# Patient Record
Sex: Female | Born: 1935 | Race: White | Hispanic: No | Marital: Single | Smoking: Never smoker
Health system: Southern US, Community
[De-identification: ages and names within clinical notes are randomized; demographics above are authoritative.]

## PROBLEM LIST (undated history)

## (undated) DIAGNOSIS — R42 Dizziness and giddiness: Secondary | ICD-10-CM

## (undated) DIAGNOSIS — I10 Essential (primary) hypertension: Secondary | ICD-10-CM

## (undated) DIAGNOSIS — M1990 Unspecified osteoarthritis, unspecified site: Secondary | ICD-10-CM

## (undated) DIAGNOSIS — I2699 Other pulmonary embolism without acute cor pulmonale: Secondary | ICD-10-CM

## (undated) DIAGNOSIS — K219 Gastro-esophageal reflux disease without esophagitis: Secondary | ICD-10-CM

## (undated) DIAGNOSIS — E785 Hyperlipidemia, unspecified: Secondary | ICD-10-CM

## (undated) DIAGNOSIS — E119 Type 2 diabetes mellitus without complications: Secondary | ICD-10-CM

## (undated) DIAGNOSIS — R0600 Dyspnea, unspecified: Secondary | ICD-10-CM

## (undated) DIAGNOSIS — N189 Chronic kidney disease, unspecified: Secondary | ICD-10-CM

## (undated) DIAGNOSIS — F419 Anxiety disorder, unspecified: Secondary | ICD-10-CM

## (undated) HISTORY — PX: SHOULDER SURGERY: SHX246

## (undated) HISTORY — PX: APPENDECTOMY: SHX54

## (undated) HISTORY — PX: BACK SURGERY: SHX140

---

## 2003-11-02 ENCOUNTER — Emergency Department: Payer: Self-pay | Admitting: Emergency Medicine

## 2004-05-17 ENCOUNTER — Ambulatory Visit: Payer: Self-pay | Admitting: General Practice

## 2004-06-01 ENCOUNTER — Emergency Department: Payer: Self-pay | Admitting: Emergency Medicine

## 2005-09-28 ENCOUNTER — Encounter: Admission: RE | Admit: 2005-09-28 | Discharge: 2005-09-28 | Payer: Self-pay | Admitting: Neurosurgery

## 2005-11-07 ENCOUNTER — Inpatient Hospital Stay (HOSPITAL_COMMUNITY): Admission: RE | Admit: 2005-11-07 | Discharge: 2005-11-08 | Payer: Self-pay | Admitting: Neurosurgery

## 2005-11-09 ENCOUNTER — Emergency Department: Payer: Self-pay | Admitting: General Practice

## 2005-11-13 ENCOUNTER — Inpatient Hospital Stay: Payer: Self-pay | Admitting: Internal Medicine

## 2005-11-27 ENCOUNTER — Ambulatory Visit: Payer: Self-pay | Admitting: Neurosurgery

## 2006-12-15 ENCOUNTER — Emergency Department: Payer: Self-pay | Admitting: Emergency Medicine

## 2009-02-23 ENCOUNTER — Emergency Department: Payer: Self-pay | Admitting: Emergency Medicine

## 2009-04-02 ENCOUNTER — Emergency Department: Payer: Self-pay | Admitting: Emergency Medicine

## 2009-04-22 ENCOUNTER — Encounter: Payer: Self-pay | Admitting: Anesthesiology

## 2009-05-09 ENCOUNTER — Encounter: Payer: Self-pay | Admitting: Anesthesiology

## 2009-09-22 ENCOUNTER — Ambulatory Visit: Payer: Self-pay | Admitting: Family Medicine

## 2010-06-13 ENCOUNTER — Emergency Department: Payer: Self-pay | Admitting: Emergency Medicine

## 2010-07-01 ENCOUNTER — Ambulatory Visit: Payer: Self-pay | Admitting: Family Medicine

## 2010-08-26 ENCOUNTER — Ambulatory Visit: Payer: Self-pay | Admitting: Specialist

## 2010-09-07 ENCOUNTER — Ambulatory Visit: Payer: Self-pay | Admitting: Specialist

## 2010-09-21 ENCOUNTER — Ambulatory Visit: Payer: Self-pay | Admitting: Specialist

## 2012-04-21 ENCOUNTER — Emergency Department: Payer: Self-pay | Admitting: Emergency Medicine

## 2012-04-21 LAB — COMPREHENSIVE METABOLIC PANEL
Alkaline Phosphatase: 100 U/L (ref 50–136)
Anion Gap: 6 — ABNORMAL LOW (ref 7–16)
BUN: 13 mg/dL (ref 7–18)
Bilirubin,Total: 0.4 mg/dL (ref 0.2–1.0)
Calcium, Total: 9 mg/dL (ref 8.5–10.1)
Chloride: 105 mmol/L (ref 98–107)
Co2: 27 mmol/L (ref 21–32)
Creatinine: 0.84 mg/dL (ref 0.60–1.30)
EGFR (African American): >60
EGFR (Non-African Amer.): >60
Glucose: 140 mg/dL — ABNORMAL HIGH (ref 65–99)
Osmolality: 278 (ref 275–301)
Potassium: 4.1 mmol/L (ref 3.5–5.1)
Total Protein: 6.8 g/dL (ref 6.4–8.2)

## 2012-04-21 LAB — TROPONIN I: Troponin-I: <0.02 ng/mL

## 2012-04-21 LAB — URINALYSIS, COMPLETE
Bilirubin,UR: NEGATIVE
Blood: NEGATIVE
Glucose,UR: NEGATIVE mg/dL (ref 0–75)
Ketone: NEGATIVE
Leukocyte Esterase: NEGATIVE
Ph: 6 (ref 4.5–8.0)
Squamous Epithelial: <1
WBC UR: <1 /HPF (ref 0–5)

## 2012-04-21 LAB — CK TOTAL AND CKMB (NOT AT ARMC)
CK, Total: 70 U/L (ref 21–215)
CK-MB: 1.2 ng/mL (ref 0.5–3.6)

## 2012-04-21 LAB — HEMOGLOBIN A1C: Hemoglobin A1C: 7.5 % — ABNORMAL HIGH (ref 4.2–6.3)

## 2012-04-21 LAB — CBC
RBC: 4.75 10*6/uL (ref 3.80–5.20)
WBC: 5.3 10*3/uL (ref 3.6–11.0)

## 2012-10-21 ENCOUNTER — Emergency Department: Payer: Self-pay | Admitting: Emergency Medicine

## 2012-10-21 LAB — COMPREHENSIVE METABOLIC PANEL
Albumin: 3.1 g/dL — ABNORMAL LOW (ref 3.4–5.0)
Anion Gap: 7 (ref 7–16)
BUN: 10 mg/dL (ref 7–18)
Bilirubin,Total: 0.3 mg/dL (ref 0.2–1.0)
Co2: 24 mmol/L (ref 21–32)
Potassium: 4.5 mmol/L (ref 3.5–5.1)
Total Protein: 6.4 g/dL (ref 6.4–8.2)

## 2012-10-21 LAB — URINALYSIS, COMPLETE
Glucose,UR: 50 mg/dL (ref 0–75)
Ph: 5 (ref 4.5–8.0)
Squamous Epithelial: <1

## 2012-10-21 LAB — TROPONIN I: Troponin-I: <0.02 ng/mL

## 2012-10-21 LAB — CBC
HCT: 38 % (ref 35.0–47.0)
HGB: 12.7 g/dL (ref 12.0–16.0)
MCHC: 33.4 g/dL (ref 32.0–36.0)
MCV: 86 fL (ref 80–100)
Platelet: 226 10*3/uL (ref 150–440)

## 2014-01-09 DIAGNOSIS — I2699 Other pulmonary embolism without acute cor pulmonale: Secondary | ICD-10-CM

## 2014-01-09 HISTORY — DX: Other pulmonary embolism without acute cor pulmonale: I26.99

## 2014-08-24 ENCOUNTER — Emergency Department: Payer: Medicare Other

## 2014-08-24 ENCOUNTER — Other Ambulatory Visit: Payer: Self-pay

## 2014-08-24 ENCOUNTER — Emergency Department
Admission: EM | Admit: 2014-08-24 | Discharge: 2014-08-24 | Disposition: A | Discharge status: Home / Self Care | Payer: Medicare Other | Attending: Emergency Medicine | Admitting: Emergency Medicine

## 2014-08-24 ENCOUNTER — Encounter: Payer: Self-pay | Admitting: *Deleted

## 2014-08-24 DIAGNOSIS — X58XXXA Exposure to other specified factors, initial encounter: Secondary | ICD-10-CM | POA: Insufficient documentation

## 2014-08-24 DIAGNOSIS — M546 Pain in thoracic spine: Secondary | ICD-10-CM | POA: Insufficient documentation

## 2014-08-24 DIAGNOSIS — Z79899 Other long term (current) drug therapy: Secondary | ICD-10-CM | POA: Insufficient documentation

## 2014-08-24 DIAGNOSIS — Y998 Other external cause status: Secondary | ICD-10-CM | POA: Insufficient documentation

## 2014-08-24 DIAGNOSIS — S29001A Unspecified injury of muscle and tendon of front wall of thorax, initial encounter: Secondary | ICD-10-CM | POA: Diagnosis present

## 2014-08-24 DIAGNOSIS — Y9389 Activity, other specified: Secondary | ICD-10-CM | POA: Insufficient documentation

## 2014-08-24 DIAGNOSIS — Z794 Long term (current) use of insulin: Secondary | ICD-10-CM | POA: Diagnosis not present

## 2014-08-24 DIAGNOSIS — Y9289 Other specified places as the place of occurrence of the external cause: Secondary | ICD-10-CM | POA: Diagnosis not present

## 2014-08-24 DIAGNOSIS — E119 Type 2 diabetes mellitus without complications: Secondary | ICD-10-CM | POA: Insufficient documentation

## 2014-08-24 DIAGNOSIS — M5489 Other dorsalgia: Secondary | ICD-10-CM

## 2014-08-24 DIAGNOSIS — S4992XA Unspecified injury of left shoulder and upper arm, initial encounter: Secondary | ICD-10-CM | POA: Diagnosis not present

## 2014-08-24 HISTORY — DX: Type 2 diabetes mellitus without complications: E11.9

## 2014-08-24 LAB — TROPONIN I

## 2014-08-24 LAB — CBC
HCT: 39.4 % (ref 35.0–47.0)
HEMOGLOBIN: 12.8 g/dL (ref 12.0–16.0)
MCH: 28.6 pg (ref 26.0–34.0)
MCHC: 32.6 g/dL (ref 32.0–36.0)
MCV: 87.8 fL (ref 80.0–100.0)
PLATELETS: 266 10*3/uL (ref 150–440)
RBC: 4.49 MIL/uL (ref 3.80–5.20)
RDW: 13.9 % (ref 11.5–14.5)
WBC: 7 10*3/uL (ref 3.6–11.0)

## 2014-08-24 LAB — BASIC METABOLIC PANEL
ANION GAP: 9 (ref 5–15)
BUN: 23 mg/dL — ABNORMAL HIGH (ref 6–20)
CALCIUM: 9.1 mg/dL (ref 8.9–10.3)
CO2: 28 mmol/L (ref 22–32)
CREATININE: 1.14 mg/dL — AB (ref 0.44–1.00)
Chloride: 100 mmol/L — ABNORMAL LOW (ref 101–111)
GFR, EST AFRICAN AMERICAN: 50 mL/min — AB (ref >60)
GFR, EST NON AFRICAN AMERICAN: 43 mL/min — AB (ref >60)
Glucose, Bld: 235 mg/dL — ABNORMAL HIGH (ref 65–99)
Potassium: 4.3 mmol/L (ref 3.5–5.1)
SODIUM: 137 mmol/L (ref 135–145)

## 2014-08-24 MED ORDER — LIDOCAINE 5 % EX PTCH
1.0000 | MEDICATED_PATCH | Freq: Two times a day (BID) | CUTANEOUS | Status: DC
Start: 2014-08-24 — End: 2014-11-26

## 2014-08-24 MED ORDER — ACETAMINOPHEN 325 MG PO TABS
650.0000 mg | ORAL_TABLET | Freq: Once | ORAL | Status: AC
  Administered 2014-08-24: 650 mg via ORAL
  Filled 2014-08-24: qty 2

## 2014-08-24 NOTE — Discharge Instructions (Signed)
Please seek medical attention for any high fevers, chest pain, shortness of breath, change in behavior, persistent vomiting, bloody stool or any other new or concerning symptoms.    Musculoskeletal Pain Musculoskeletal pain is muscle and boney aches and pains. These pains can occur in any part of the body. Your caregiver may treat you without knowing the cause of the pain. They may treat you if blood or urine tests, X-rays, and other tests were normal.  CAUSES There is often not a definite cause or reason for these pains. These pains may be caused by a type of germ (virus). The discomfort may also come from overuse. Overuse includes working out too hard when your body is not fit. Boney aches also come from weather changes. Bone is sensitive to atmospheric pressure changes. HOME CARE INSTRUCTIONS   Ask when your test results will be ready. Make sure you get your test results.  Only take over-the-counter or prescription medicines for pain, discomfort, or fever as directed by your caregiver. If you were given medications for your condition, do not drive, operate machinery or power tools, or sign legal documents for 24 hours. Do not drink alcohol. Do not take sleeping pills or other medications that may interfere with treatment.  Continue all activities unless the activities cause more pain. When the pain lessens, slowly resume normal activities. Gradually increase the intensity and duration of the activities or exercise.  During periods of severe pain, bed rest may be helpful. Lay or sit in any position that is comfortable.  Putting ice on the injured area.  Put ice in a bag.  Place a towel between your skin and the bag.  Leave the ice on for 15 to 20 minutes, 3 to 4 times a day.  Follow up with your caregiver for continued problems and no reason can be found for the pain. If the pain becomes worse or does not go away, it may be necessary to repeat tests or do additional testing. Your  caregiver may need to look further for a possible cause. SEEK IMMEDIATE MEDICAL CARE IF:  You have pain that is getting worse and is not relieved by medications.  You develop chest pain that is associated with shortness or breath, sweating, feeling sick to your stomach (nauseous), or throw up (vomit).  Your pain becomes localized to the abdomen.  You develop any new symptoms that seem different or that concern you. MAKE SURE YOU:   Understand these instructions.  Will watch your condition.  Will get help right away if you are not doing well or get worse. Document Released: 12/26/2004 Document Revised: 03/20/2011 Document Reviewed: 08/30/2012 [facility] Patient Information 2015 [place], [place]. This information is not intended to replace advice given to you by your health care provider. Make sure you discuss any questions you have with your health care provider.

## 2014-08-24 NOTE — ED Notes (Signed)
Pt reports increased back pain and left shoulder pain, pt reports a prior injury to her back

## 2014-08-24 NOTE — ED Provider Notes (Signed)
[facility] Emergency Department Provider Note    ____________________________________________  Time seen: 1950  I have reviewed the triage vital signs and the nursing notes.   HISTORY  Chief Complaint Back Pain   History limited by: Not Limited   HPI Sherry [name] is a 81 y.o. female who presents to the emergency department today because of concerns for left upper back pain. This pain is been going on for 2 days. She describes it as being present on the medial aspect of her left shoulder today. She does have pain whenever she moves her arm up. She does not have pain radiating down her arm however she denies any numbness or tingling in either of her upper or lower extremities. Patient denies any shortness of breath. Denies similar pain in the past. Denies any trauma.     Past Medical History  Diagnosis Date  . Diabetes mellitus without complication     There are no active problems to display for this patient.   Past Surgical History  Procedure Laterality Date  . Shoulder surgery    . Back surgery      Current Outpatient Rx  Name  Route  Sig  Dispense  Refill  . insulin NPH Human (HUMULIN N,NOVOLIN N) 100 UNIT/ML injection   Subcutaneous   Inject 20 Units into the skin 2 (two) times daily.         Marland Kitchen lisinopril-hydrochlorothiazide (PRINZIDE,ZESTORETIC) 10-12.5 MG per tablet   Oral   Take 1 tablet by mouth daily.         . metFORMIN (GLUCOPHAGE) 1000 MG tablet   Oral   Take 1,000 mg by mouth 2 (two) times daily.           Allergies Review of patient's allergies indicates no known allergies.  No family history on file.  Social History Social History  Substance Use Topics  . Smoking status: Never Smoker   . Smokeless tobacco: None  . Alcohol Use: No    Review of Systems  Constitutional: Negative for fever. Cardiovascular: Negative for chest pain. Respiratory: Negative for shortness of breath. Gastrointestinal: Negative  for abdominal pain, vomiting and diarrhea. Genitourinary: Negative for dysuria. Musculoskeletal: Left upper back pain Skin: Negative for rash. Neurological: Negative for headaches, focal weakness or numbness.   10-point ROS otherwise negative.  ____________________________________________   PHYSICAL EXAM:  VITAL SIGNS: ED Triage Vitals  Enc Vitals Group     BP 08/24/14 1801 186/77 mmHg     Pulse Rate 08/24/14 1801 87     Resp --      Temp 08/24/14 1801 98 F (36.7 C)     Temp Source 08/24/14 1801 Oral     SpO2 08/24/14 1801 97 %     Weight 08/24/14 1801 170 lb (77.111 kg)     Height 08/24/14 1801 5\' 5"  (1.651 m)     Head Cir --      Peak Flow --      Pain Score 08/24/14 1801 10   Constitutional: Alert and oriented. Well appearing and in no distress. Eyes: Conjunctivae are normal. PERRL. Normal extraocular movements. ENT   Head: Normocephalic and atraumatic.   Nose: No congestion/rhinnorhea.   Mouth/Throat: Mucous membranes are moist.   Neck: No stridor. Hematological/Lymphatic/Immunilogical: No cervical lymphadenopathy. Cardiovascular: Normal rate, regular rhythm.  No murmurs, rubs, or gallops. Respiratory: Normal respiratory effort without tachypnea nor retractions. Breath sounds are clear and equal bilaterally. No wheezes/rales/rhonchi. Gastrointestinal: Soft and nontender. No distention.  Genitourinary: Deferred  Musculoskeletal: Tender to palpation in the medial aspect of the left shoulder blade. Also with pain when I attempt to abduct the left shoulder. No obvious deformities. Neurologic:  Normal speech and language. No gross focal neurologic deficits are appreciated. Speech is normal.  Skin:  Skin is warm, dry and intact. No rash noted. Psychiatric: Mood and affect are normal. Speech and behavior are normal. Patient exhibits appropriate insight and judgment.  ____________________________________________    LABS (pertinent positives/negatives)  Labs  Reviewed  BASIC METABOLIC PANEL - Abnormal; Notable for the following:    Chloride 100 (*)    Glucose, Bld 235 (*)    BUN 23 (*)    Creatinine, Ser 1.14 (*)    GFR calc non Af Amer 43 (*)    GFR calc Af Amer 50 (*)    All other components within normal limits  CBC  TROPONIN I     ____________________________________________   EKG  I, Nance Pear, attending physician, personally viewed and interpreted this EKG  EKG Time: 1801 Rate: 93 Rhythm: sinus rhythm with 1st degree av block Axis: normal Intervals: qtc 512 QRS: LBBB ST changes: no st elevation equivalent.   ____________________________________________    RADIOLOGY  CXR IMPRESSION: Hypoinflation without acute cardiopulmonary disease.  Mild irregularity over the distal right clavicle as cannot exclude a fracture. Recommend clinical correlation and right clavicle series if pain localized to this region. ____________________________________________   PROCEDURES  Procedure(s) performed: None  Critical Care performed: No  ____________________________________________   INITIAL IMPRESSION / ASSESSMENT AND PLAN / ED COURSE  Pertinent labs & imaging results that were available during my care of the patient were reviewed by me and considered in my medical decision making (see chart for details).  Patient presented to the emergency department today with left upper back pain. On exam patient is tender to palpation of the left medial shoulder blade and does have pain when I attempt to manipulate the left arm at the shoulder. At this point given clinical exam I think it is highly likely patient is suffering from musculoskeletal pain. Doubt cardiac etiology of pain.  ____________________________________________   FINAL CLINICAL IMPRESSION(S) / ED DIAGNOSES  Final diagnoses:  Other back pain     Nance Pear, MD 08/24/14 [phone]

## 2014-08-24 NOTE — ED Notes (Signed)
Pt reports left shoulder blade pain beginning Saturday and worsening today. Pt denies trauma to the area. Denies radiation to other areas or other symptoms. Ice reportedly decreases pain and movement increases. 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 No swelling to the area noted. Tenderness upon palpation.

## 2014-11-26 ENCOUNTER — Inpatient Hospital Stay
Admission: EM | Admit: 2014-11-26 | Discharge: 2014-11-28 | DRG: 872 | Disposition: A | Discharge status: Home / Self Care | Payer: Medicare Other | Attending: Internal Medicine | Admitting: Internal Medicine

## 2014-11-26 ENCOUNTER — Inpatient Hospital Stay: Payer: Medicare Other

## 2014-11-26 ENCOUNTER — Emergency Department: Payer: Medicare Other

## 2014-11-26 ENCOUNTER — Encounter: Payer: Self-pay | Admitting: Emergency Medicine

## 2014-11-26 DIAGNOSIS — A419 Sepsis, unspecified organism: Principal | ICD-10-CM | POA: Diagnosis present

## 2014-11-26 DIAGNOSIS — I959 Hypotension, unspecified: Secondary | ICD-10-CM

## 2014-11-26 DIAGNOSIS — Z79891 Long term (current) use of opiate analgesic: Secondary | ICD-10-CM | POA: Diagnosis not present

## 2014-11-26 DIAGNOSIS — N183 Chronic kidney disease, stage 3 (moderate): Secondary | ICD-10-CM | POA: Diagnosis present

## 2014-11-26 DIAGNOSIS — E11649 Type 2 diabetes mellitus with hypoglycemia without coma: Secondary | ICD-10-CM | POA: Diagnosis present

## 2014-11-26 DIAGNOSIS — E162 Hypoglycemia, unspecified: Secondary | ICD-10-CM | POA: Diagnosis not present

## 2014-11-26 DIAGNOSIS — R4182 Altered mental status, unspecified: Secondary | ICD-10-CM

## 2014-11-26 DIAGNOSIS — T68XXXA Hypothermia, initial encounter: Secondary | ICD-10-CM

## 2014-11-26 DIAGNOSIS — N390 Urinary tract infection, site not specified: Secondary | ICD-10-CM | POA: Diagnosis present

## 2014-11-26 DIAGNOSIS — Z7984 Long term (current) use of oral hypoglycemic drugs: Secondary | ICD-10-CM | POA: Diagnosis not present

## 2014-11-26 DIAGNOSIS — I129 Hypertensive chronic kidney disease with stage 1 through stage 4 chronic kidney disease, or unspecified chronic kidney disease: Secondary | ICD-10-CM | POA: Diagnosis present

## 2014-11-26 DIAGNOSIS — Z79899 Other long term (current) drug therapy: Secondary | ICD-10-CM

## 2014-11-26 DIAGNOSIS — Z794 Long term (current) use of insulin: Secondary | ICD-10-CM

## 2014-11-26 HISTORY — DX: Essential (primary) hypertension: I10

## 2014-11-26 LAB — APTT: aPTT: 29 seconds (ref 24–36)

## 2014-11-26 LAB — CBC WITH DIFFERENTIAL/PLATELET
BASOS PCT: 1 %
Basophils Absolute: 0 10*3/uL (ref 0–0.1)
EOS ABS: 0.1 10*3/uL (ref 0–0.7)
EOS PCT: 1 %
HEMATOCRIT: 41.4 % (ref 35.0–47.0)
Hemoglobin: 13.2 g/dL (ref 12.0–16.0)
Lymphocytes Relative: 24 %
Lymphs Abs: 2.2 10*3/uL (ref 1.0–3.6)
MCH: 28.2 pg (ref 26.0–34.0)
MCHC: 32 g/dL (ref 32.0–36.0)
MCV: 88 fL (ref 80.0–100.0)
MONO ABS: 0.5 10*3/uL (ref 0.2–0.9)
MONOS PCT: 6 %
NEUTROS ABS: 6.1 10*3/uL (ref 1.4–6.5)
Neutrophils Relative %: 68 %
PLATELETS: 296 10*3/uL (ref 150–440)
RBC: 4.7 MIL/uL (ref 3.80–5.20)
RDW: 14.1 % (ref 11.5–14.5)
WBC: 8.9 10*3/uL (ref 3.6–11.0)

## 2014-11-26 LAB — COMPREHENSIVE METABOLIC PANEL
ALK PHOS: 85 U/L (ref 38–126)
ALT: 24 U/L (ref 14–54)
AST: 28 U/L (ref 15–41)
Albumin: 3.6 g/dL (ref 3.5–5.0)
Anion gap: 11 (ref 5–15)
BILIRUBIN TOTAL: 0.4 mg/dL (ref 0.3–1.2)
BUN: 25 mg/dL — AB (ref 6–20)
CALCIUM: 9.3 mg/dL (ref 8.9–10.3)
CO2: 26 mmol/L (ref 22–32)
CREATININE: 1.21 mg/dL — AB (ref 0.44–1.00)
Chloride: 104 mmol/L (ref 101–111)
GFR, EST AFRICAN AMERICAN: 46 mL/min — AB (ref >60)
GFR, EST NON AFRICAN AMERICAN: 40 mL/min — AB (ref >60)
Glucose, Bld: 172 mg/dL — ABNORMAL HIGH (ref 65–99)
Potassium: 4 mmol/L (ref 3.5–5.1)
Sodium: 141 mmol/L (ref 135–145)
TOTAL PROTEIN: 7 g/dL (ref 6.5–8.1)

## 2014-11-26 LAB — URINALYSIS COMPLETE WITH MICROSCOPIC (ARMC ONLY)
Bilirubin Urine: NEGATIVE
Glucose, UA: 50 mg/dL — AB
Ketones, ur: NEGATIVE mg/dL
Nitrite: POSITIVE — AB
PROTEIN: NEGATIVE mg/dL
Specific Gravity, Urine: 1.016 (ref 1.005–1.030)
Squamous Epithelial / LPF: NONE SEEN
pH: 5 (ref 5.0–8.0)

## 2014-11-26 LAB — GLUCOSE, CAPILLARY
GLUCOSE-CAPILLARY: 91 mg/dL (ref 65–99)
GLUCOSE-CAPILLARY: 213 mg/dL — AB (ref 65–99)
Glucose-Capillary: 164 mg/dL — ABNORMAL HIGH (ref 65–99)

## 2014-11-26 LAB — TROPONIN I: Troponin I: <0.03 ng/mL (ref <0.031)

## 2014-11-26 LAB — LACTIC ACID, PLASMA
LACTIC ACID, VENOUS: 2.5 mmol/L — AB (ref 0.5–2.0)
Lactic Acid, Venous: 2.6 mmol/L (ref 0.5–2.0)
Lactic Acid, Venous: 1.8 mmol/L (ref 0.5–2.0)
Lactic Acid, Venous: 2 mmol/L (ref 0.5–2.0)

## 2014-11-26 LAB — PROTIME-INR
INR: 0.87
PROTHROMBIN TIME: 12.1 s (ref 11.4–15.0)

## 2014-11-26 LAB — PROCALCITONIN

## 2014-11-26 LAB — TSH: TSH: 2.791 u[IU]/mL (ref 0.350–4.500)

## 2014-11-26 LAB — LIPASE, BLOOD: LIPASE: 46 U/L (ref 11–51)

## 2014-11-26 MED ORDER — HEPARIN SODIUM (PORCINE) 5000 UNIT/ML IJ SOLN
5000.0000 [IU] | Freq: Three times a day (TID) | INTRAMUSCULAR | Status: DC
  Administered 2014-11-26 – 2014-11-28 (×6): 5000 [IU] via SUBCUTANEOUS
  Filled 2014-11-26 (×6): qty 1

## 2014-11-26 MED ORDER — HYDROCODONE-ACETAMINOPHEN 5-325 MG PO TABS: 1.0000 | ORAL_TABLET | ORAL | Status: DC | PRN

## 2014-11-26 MED ORDER — PIPERACILLIN-TAZOBACTAM 3.375 G IVPB: 3.3750 g | Freq: Three times a day (TID) | INTRAVENOUS | Status: DC

## 2014-11-26 MED ORDER — BISACODYL 5 MG PO TBEC: 5.0000 mg | DELAYED_RELEASE_TABLET | Freq: Every day | ORAL | Status: DC | PRN

## 2014-11-26 MED ORDER — DOCUSATE SODIUM 100 MG PO CAPS
100.0000 mg | ORAL_CAPSULE | Freq: Two times a day (BID) | ORAL | Status: DC
  Administered 2014-11-26 – 2014-11-28 (×4): 100 mg via ORAL
  Filled 2014-11-26 (×4): qty 1

## 2014-11-26 MED ORDER — ACETAMINOPHEN 325 MG PO TABS: 650.0000 mg | ORAL_TABLET | Freq: Four times a day (QID) | ORAL | Status: DC | PRN

## 2014-11-26 MED ORDER — ACETAMINOPHEN 650 MG RE SUPP: 650.0000 mg | Freq: Four times a day (QID) | RECTAL | Status: DC | PRN

## 2014-11-26 MED ORDER — TRAZODONE HCL 50 MG PO TABS
25.0000 mg | ORAL_TABLET | Freq: Every evening | ORAL | Status: DC | PRN
  Administered 2014-11-26: 25 mg via ORAL
  Filled 2014-11-26 (×2): qty 1

## 2014-11-26 MED ORDER — ALUM & MAG HYDROXIDE-SIMETH 200-200-20 MG/5ML PO SUSP: 30.0000 mL | Freq: Four times a day (QID) | ORAL | Status: DC | PRN

## 2014-11-26 MED ORDER — DEXTROSE 5 % IV SOLN
2.0000 g | INTRAVENOUS | Status: DC
  Administered 2014-11-27: 2 g via INTRAVENOUS
  Filled 2014-11-26: qty 2

## 2014-11-26 MED ORDER — SODIUM CHLORIDE 0.9 % IV BOLUS (SEPSIS)
1000.0000 mL | Freq: Once | INTRAVENOUS | Status: AC
  Administered 2014-11-26: 1000 mL via INTRAVENOUS

## 2014-11-26 MED ORDER — ONDANSETRON HCL 4 MG/2ML IJ SOLN: 4.0000 mg | Freq: Four times a day (QID) | INTRAMUSCULAR | Status: DC | PRN

## 2014-11-26 MED ORDER — CEFTRIAXONE SODIUM 2 G IJ SOLR
2.0000 g | Freq: Once | INTRAMUSCULAR | Status: DC
  Filled 2014-11-26: qty 2

## 2014-11-26 MED ORDER — SODIUM CHLORIDE 0.9 % IJ SOLN
3.0000 mL | Freq: Two times a day (BID) | INTRAMUSCULAR | Status: DC
  Administered 2014-11-26 – 2014-11-27 (×2): 3 mL via INTRAVENOUS

## 2014-11-26 MED ORDER — SODIUM CHLORIDE 0.9 % IV BOLUS (SEPSIS): 500.0000 mL | INTRAVENOUS | Status: DC

## 2014-11-26 MED ORDER — SODIUM CHLORIDE 0.9 % IV BOLUS (SEPSIS)
1000.0000 mL | INTRAVENOUS | Status: AC
  Administered 2014-11-26: 1000 mL via INTRAVENOUS

## 2014-11-26 MED ORDER — ONDANSETRON HCL 4 MG PO TABS: 4.0000 mg | ORAL_TABLET | Freq: Four times a day (QID) | ORAL | Status: DC | PRN

## 2014-11-26 MED ORDER — CEFTRIAXONE SODIUM 1 G IJ SOLR
1.0000 g | Freq: Once | INTRAMUSCULAR | Status: AC
  Administered 2014-11-26: 1 g via INTRAVENOUS
  Filled 2014-11-26: qty 10

## 2014-11-26 MED ORDER — DEXTROSE-NACL 5-0.9 % IV SOLN
INTRAVENOUS | Status: DC
  Administered 2014-11-26 – 2014-11-27 (×2): via INTRAVENOUS

## 2014-11-26 MED ORDER — PIPERACILLIN-TAZOBACTAM 3.375 G IVPB 30 MIN
3.3750 g | Freq: Once | INTRAVENOUS | Status: AC
  Administered 2014-11-26: 3.375 g via INTRAVENOUS
  Filled 2014-11-26: qty 50

## 2014-11-26 MED ORDER — SODIUM CHLORIDE 0.9 % IV BOLUS (SEPSIS)
500.0000 mL | INTRAVENOUS | Status: AC
  Administered 2014-11-26: 500 mL via INTRAVENOUS

## 2014-11-26 MED ORDER — DEXTROSE 5 % IV SOLN
1.0000 g | INTRAVENOUS | Status: AC
  Administered 2014-11-26: 1 g via INTRAVENOUS
  Filled 2014-11-26: qty 10

## 2014-11-26 MED ORDER — VANCOMYCIN HCL IN DEXTROSE 1-5 GM/200ML-% IV SOLN
1000.0000 mg | Freq: Once | INTRAVENOUS | Status: AC
  Administered 2014-11-26: 1000 mg via INTRAVENOUS
  Filled 2014-11-26: qty 200

## 2014-11-26 MED ORDER — SODIUM CHLORIDE 0.9 % IV BOLUS (SEPSIS): 1000.0000 mL | INTRAVENOUS | Status: DC

## 2014-11-26 NOTE — Progress Notes (Signed)
81yo wf admitted to room 253 from ED with Sepsis.  A&O x3, not able to assess her gait at this time.  No distress on ra.  Cardiac monitor placed on pt, pt denies chest pain.  Lungs diminished lower lobes bil.  Abdomen benign.  SL lt ac flushes well.  IVF infusing well rt ac.  Foley patent with yellow urine.  Skin intact, check with [facility].  Oriented to room and surroundings, POC reviewed with pt.  Fall precautions in place.  Denies need at this time.  CB in reach, SR up x 2, bed alarm on.

## 2014-11-26 NOTE — ED Notes (Signed)
Dr shah at bedside.  

## 2014-11-26 NOTE — Progress Notes (Signed)
Inpatient Diabetes Program Recommendations  AACE/ADA: New Consensus Statement on Inpatient Glycemic Control (2015)  Target Ranges:  Prepandial:   less than 140 mg/dL      Peak postprandial:   less than 180 mg/dL (1-2 hours)      Critically ill patients:  140 - 180 mg/dL  Results for Sherry [name], Sherry [name] (MRN [id number]) as of 11/26/2014 12:57  Ref. Range 11/26/2014 07:05  Glucose Latest Ref Range: 65-99 mg/dL 172 (H)   Results for [name], [name] (MRN [id number]) as of 11/26/2014 12:57  Ref. Range 11/26/2014 06:47  Glucose-Capillary Latest Ref Range: 65-99 mg/dL 164 (H)   Review of Glycemic Control  Diabetes history: DM2 Outpatient Diabetes medications: NPH 20 units BID, Metformin 1000 mg BID Current orders for Inpatient glycemic control: CBGs QAM  Inpatient Diabetes Program Recommendations: Correction (SSI): Please consider ordering CBG monitoring Q4H and when glucose is consistently >180 mg/dl without hypoglycemia treatment then consider ordering Novolog senstive correction scale Q4H.  Thanks, Barnie Alderman, RN, MSN, CDE Diabetes Coordinator Inpatient Diabetes Program [phone] (Team Pager from [place] to [place]) [phone] ([place] office) [phone] [facility] office) [phone] [facility] office)

## 2014-11-26 NOTE — ED Notes (Signed)
Pt presents to ER from home with complaints of hypoglycemia, per EMS pt was found by family member lying side ways on bed, diaphoretic, not alert or oriented, upon EMS arrival pt's CBG was 50, EMS administered D50 1 amp, pt's CBG increased to 393, pt shivering, reports very cold, rectal temperature 92.1 F upon arrival to ER, bear hugger on pt. Pt talks in complete sentences, denies any chest pain or any other symptom at present.

## 2014-11-26 NOTE — ED Provider Notes (Addendum)
-----------------------------------------   11:43 AM on 11/26/2014 -----------------------------------------  Arrangements for this patient to be admitted to the hospital were made between Dr. Karma Greaser and Dr. Manuella Ghazi. After Dr. Karma Greaser departed, I was told that there were no intensive care unit beds available and it may be advisable to transfer the patient.  I reexamined the patient, who is alert and communicative and in no acute distress. Her blood pressure and heart rate were normal. Her temperature was approximately 96.9. At that time she still had a bear hugger on. I offered to transfer her at that time or observe her in the emergency department to insure stability and then admit to [facility]. The patient agreed to observation and possible admission to [facility]. I also discussed this option with Dr. Brigitte Pulse who agreed.  At this time, we have removed the bear approximately 35 minutes ago. The patient is tolerating by mouth. Her vital signs are all reasonable. We will move towards admission to [place].  This is been discussed with Dr. Manuella Ghazi again. He will see the patient and continue with the admission.  Ahmed Prima, MD 11/26/14 [id number]  Ahmed Prima, MD 11/26/14 [phone]

## 2014-11-26 NOTE — H&P (Addendum)
[place] [facility] [place] NAME: Sherry [name]    MR#:  [id number]  DATE OF BIRTH:  [birth year]  DATE OF ADMISSION:  11/26/2014  PRIMARY CARE PHYSICIAN: Trecia Rogers MD  REQUESTING/REFERRING PHYSICIAN: Hinda Kehr MD  CHIEF COMPLAINT:   Chief Complaint  Patient presents with  . Hypoglycemia    HISTORY OF PRESENT ILLNESS:  Sherry [name]  is a 81 y.o. female with a known history of dependent diabetes admitted for altered mental status and initially thought to have sepsis. Reportedly she was found by family member at home lying sideways in bed, diaphoretic, and not responding. When EMS arrived they found that her fingerstick blood sugar was 50 and she was givenan ampule of D50. Afterwards her blood sugar increased to nearly 400, but she remained somewhat altered and began shivering violently. Her rectal temp upon arrival in the emergency department was 92, she was hypotensive at 88/50, and she remained altered and shivering violently. However, she was verbal and able to deny any specific complaints including denying headache, shortness of breath, chest pain, abdominal pain, nausea/vomiting. Overall her symptoms were severe and the timing/onset of the symptoms is unknown. after several liters of fluids - her BP improved and with bear hugger her temp is coming up so is her mental status.  PAST MEDICAL HISTORY:   Past Medical History  Diagnosis Date  . Diabetes mellitus without complication ([place])   . Hypertension     PAST SURGICAL HISTORY:   Past Surgical History  Procedure Laterality Date  . Shoulder surgery    . Back surgery      SOCIAL HISTORY:   Social History  Substance Use Topics  . Smoking status: Never Smoker   . Smokeless tobacco: Not on file  . Alcohol Use: No    FAMILY HISTORY:  History reviewed. No pertinent family history. FATHER: HTN DRUG ALLERGIES:  No Known Allergies  REVIEW OF SYSTEMS:   Review  of Systems  Constitutional: Negative for fever, weight loss, malaise/fatigue and diaphoresis.  HENT: Negative for ear discharge, ear pain, hearing loss, nosebleeds, sore throat and tinnitus.   Eyes: Negative for blurred vision and pain.  Respiratory: Negative for cough, hemoptysis, shortness of breath and wheezing.   Cardiovascular: Negative for chest pain, palpitations, orthopnea and leg swelling.  Gastrointestinal: Negative for heartburn, nausea, vomiting, abdominal pain, diarrhea, constipation and blood in stool.  Genitourinary: Negative for dysuria, urgency and frequency.  Musculoskeletal: Negative for myalgias and back pain.  Skin: Negative for itching and rash.  Neurological: Positive for loss of consciousness. Negative for dizziness, tingling, tremors, focal weakness, seizures, weakness and headaches.  Psychiatric/Behavioral: Negative for depression. The patient is not nervous/anxious.     MEDICATIONS AT HOME:   Prior to Admission medications   Medication Sig Start Date End Date Taking? Authorizing Provider  diclofenac sodium (VOLTAREN) 1 % GEL Apply 2 g topically 4 (four) times daily.   Yes Historical Provider, MD  insulin NPH Human (HUMULIN N,NOVOLIN N) 100 UNIT/ML injection Inject 20 Units into the skin 2 (two) times daily.   Yes Historical Provider, MD  lisinopril-hydrochlorothiazide (PRINZIDE,ZESTORETIC) 10-12.5 MG per tablet Take 1 tablet by mouth daily.   Yes Historical Provider, MD  metFORMIN (GLUCOPHAGE) 1000 MG tablet Take 1,000 mg by mouth 2 (two) times daily.   Yes Historical Provider, MD  oxyCODONE-acetaminophen (PERCOCET/ROXICET) 5-325 MG tablet Take 1 tablet by mouth every 4 (four) hours as needed for moderate pain or severe pain.  Yes Historical Provider, MD      VITAL SIGNS:  Blood pressure 175/60, pulse 81, temperature 98.2 F (36.8 C), temperature source Oral, resp. rate 18, height 5\' 5"  (1.651 m), weight 79.017 kg (174 lb 3.2 oz), SpO2 98 %.  PHYSICAL  EXAMINATION:  Physical Exam  Constitutional: She is oriented to person, place, and time and well-developed, well-nourished, and in no distress.  HENT:  Head: Normocephalic and atraumatic.  Eyes: Conjunctivae and EOM are normal. Pupils are equal, round, and reactive to light.  Neck: Normal range of motion. Neck supple. No tracheal deviation present. No thyromegaly present.  Cardiovascular: Normal rate, regular rhythm and normal heart sounds.   Pulmonary/Chest: Effort normal and breath sounds normal. No respiratory distress. She has no wheezes. She exhibits no tenderness.  Abdominal: Soft. Bowel sounds are normal. She exhibits no distension. There is no tenderness.  Musculoskeletal: Normal range of motion.  Neurological: She is alert and oriented to person, place, and time. No cranial nerve deficit.  Skin: Skin is warm and dry. No rash noted.  Psychiatric: Mood and affect normal.    LABORATORY PANEL:   CBC  Recent Labs Lab 11/26/14 0705  WBC 8.9  HGB 13.2  HCT 41.4  PLT 296   ------------------------------------------------------------------------------------------------------------------  Chemistries   Recent Labs Lab 11/26/14 0705  NA 141  K 4.0  CL 104  CO2 26  GLUCOSE 172*  BUN 25*  CREATININE 1.21*  CALCIUM 9.3  AST 28  ALT 24  ALKPHOS 85  BILITOT 0.4   ------------------------------------------------------------------------------------------------------------------  Cardiac Enzymes  Recent Labs Lab 11/26/14 0705  TROPONINI <0.03   ------------------------------------------------------------------------------------------------------------------  RADIOLOGY:  Dg Chest Port 1 View  11/26/2014  CLINICAL DATA:  Sepsis.  Back pain EXAM: PORTABLE CHEST 1 VIEW COMPARISON:  A 15 2016 FINDINGS: The heart size and mediastinal contours are within normal limits. Both lungs are clear. The visualized skeletal structures are unremarkable. IMPRESSION: No active  disease. Electronically Signed   By: Franchot Gallo M.D.   On: 11/26/2014 07:11      IMPRESSION AND PLAN:   * suspected sepsis: present on admission, hypothermia, hypotension, Elevated lactate, AMS likely source urine. Blood and urine c/s  * hypoglycemia: hold antiDM meds and check sugars Q 4-6 hrs, monitor  * UTI: based on UA, rocephin and await urine c/s  * Hypothermia: could be due to sepsis, improved with bear hugger    All the records are reviewed and case discussed with ED provider. Management plans discussed with the patient, family and they are in agreement.  CODE STATUS: Full Code  TOTAL TIME TAKING CARE OF THIS PATIENT: 55 minutes.    [facility], Madelin Weseman M.D on 11/26/2014 at 6:24 PM  Between 7am to 6pm - Pager - [phone]  After 6pm go to [email or web address] - password [email or web address] [facility]  [facility]  Office  [phone]  CC: Primary care physician; Trecia Rogers MD

## 2014-11-26 NOTE — ED Notes (Signed)
Resumed care from [place]. Pt alert, responsive, coherent. Abx started, cultures drawn & sent, urine sent to lab. NAD noted at this time. Pt temp 94.8 per foley temp. Retail banker in place.

## 2014-11-26 NOTE — ED Provider Notes (Signed)
[facility] Emergency Department Provider Note  ____________________________________________  Time seen: Approximately 6:56 AM  I have reviewed the triage vital signs and the nursing notes.   HISTORY  Chief Complaint Hypoglycemia  Altered mental status limits the patient's history  HPI Sherry [name] is a 81 y.o. female with a history of his lung dependent diabetes who presents by EMS for altered mental status.  Reportedly she was found by family member at home lying sideways in bed, diaphoretic, and not responding.  When EMS arrived they found that her fingerstick blood sugar was 50 and she was givenan ampule of D50.  Afterwards her blood sugar increased to nearly 400, but she remained somewhat altered and began shivering violently.  Her rectal temp upon arrival in the emergency department was 92, she was hypotensive at 88/50, and she remained altered and shivering violently.  However, she was verbal and able to deny any specific complaints including denying headache, shortness of breath, chest pain, abdominal pain, nausea/vomiting.  Overall her symptoms were severe and the timing/onset of the symptoms is unknown.   Past Medical History  Diagnosis Date  . Diabetes mellitus without complication ([place])     There are no active problems to display for this patient.   Past Surgical History  Procedure Laterality Date  . Shoulder surgery    . Back surgery      Current Outpatient Rx  Name  Route  Sig  Dispense  Refill  . oxyCODONE-acetaminophen (PERCOCET/ROXICET) 5-325 MG tablet   Oral   Take 1 tablet by mouth every 4 (four) hours as needed for moderate pain or severe pain.         Marland Kitchen insulin NPH Human (HUMULIN N,NOVOLIN N) 100 UNIT/ML injection   Subcutaneous   Inject 20 Units into the skin 2 (two) times daily.         Marland Kitchen lidocaine (LIDODERM) 5 %   Transdermal   Place 1 patch onto the skin every 12 (twelve) hours. Remove & Discard patch within 12 hours  or as directed by MD   10 patch   0   . lisinopril-hydrochlorothiazide (PRINZIDE,ZESTORETIC) 10-12.5 MG per tablet   Oral   Take 1 tablet by mouth daily.         . metFORMIN (GLUCOPHAGE) 1000 MG tablet   Oral   Take 1,000 mg by mouth 2 (two) times daily.           Allergies Review of patient's allergies indicates no known allergies.  No family history on file.  Social History Social History  Substance Use Topics  . Smoking status: Never Smoker   . Smokeless tobacco: None  . Alcohol Use: No    Review of Systems Constitutional: Severe chills Eyes: No visual changes. ENT: No sore throat. Cardiovascular: Denies chest pain. Respiratory: Denies shortness of breath. Gastrointestinal: No abdominal pain.  No nausea, no vomiting.  No diarrhea.  No constipation. Genitourinary: Negative for dysuria. Musculoskeletal: Negative for back pain. Skin: Negative for rash. Neurological: Previously altered, now resolved.  10-point ROS otherwise negative.  ____________________________________________   PHYSICAL EXAM:  ED Triage Vitals  Enc Vitals Group     BP 11/26/14 0700 174/91 mmHg     Pulse Rate 11/26/14 0659 89     Resp 11/26/14 0659 20     Temp 11/26/14 0659 92.1 F (33.4 C)     Temp Source 11/26/14 0659 Rectal     SpO2 11/26/14 0659 97 %     Weight 11/26/14  0659 160 lb (72.576 kg)     Height 11/26/14 0659 5\' 3"  (1.6 m)     Head Cir --      Peak Flow --      Pain Score 11/26/14 0821 Asleep     Pain Loc --      Pain Edu? --      Excl. in [place]? --     Constitutional: Alert and oriented, but does not remember what happened to her earlier this evening.  Shivering violently. Eyes: Conjunctivae are normal. PERRL. EOMI. Head: Atraumatic. Nose: No congestion/rhinnorhea. Mouth/Throat: Mucous membranes are moist.  Oropharynx non-erythematous. Neck: No stridor.   Cardiovascular: Normal rate, regular rhythm. Grossly normal heart sounds.  Good peripheral  circulation. Respiratory: Normal respiratory effort.  No retractions. Lungs CTAB. Gastrointestinal: Soft and nontender. No distention. No abdominal bruits. No CVA tenderness. Musculoskeletal: No lower extremity tenderness nor edema.  No joint effusions. Neurologic:  Normal speech and language. No gross focal neurologic deficits are appreciated.  Skin:  Skin is cool, dry and intact. No rash noted.   ____________________________________________   LABS (all labs ordered are listed, but only abnormal results are displayed)  Labs Reviewed  LACTIC ACID, PLASMA - Abnormal; Notable for the following:    Lactic Acid, Venous 2.6 (*)    All other components within normal limits  COMPREHENSIVE METABOLIC PANEL - Abnormal; Notable for the following:    Glucose, Bld 172 (*)    BUN 25 (*)    Creatinine, Ser 1.21 (*)    GFR calc non Af Amer 40 (*)    GFR calc Af Amer 46 (*)    All other components within normal limits  GLUCOSE, CAPILLARY - Abnormal; Notable for the following:    Glucose-Capillary 164 (*)    All other components within normal limits  URINALYSIS COMPLETEWITH MICROSCOPIC ([facility] ONLY) - Abnormal; Notable for the following:    Color, Urine YELLOW (*)    APPearance CLEAR (*)    Glucose, UA 50 (*)    Hgb urine dipstick 1+ (*)    Nitrite POSITIVE (*)    Leukocytes, UA 1+ (*)    Bacteria, UA MANY (*)    All other components within normal limits  CULTURE, BLOOD (ROUTINE X 2)  CULTURE, BLOOD (ROUTINE X 2)  URINE CULTURE  LIPASE, BLOOD  TROPONIN I  CBC WITH DIFFERENTIAL/PLATELET  APTT  PROTIME-INR  LACTIC ACID, PLASMA  CBG MONITORING, ED   ____________________________________________  EKG  ED ECG REPORT I, Reyn Faivre, the attending physician, personally viewed and interpreted this ECG.   Date: 11/26/2014  EKG Time: 06:47  Rate: 87  Rhythm: normal sinus rhythm  Axis: Normal  Intervals:left bundle branch block  ST&T Change: No evidence of acute ischemia.  There is  some artifact due to the patient's shivering.  Her left bundle branch block was present at a prior EKG.  The computer is interpreting atrial fibrillation but I see P waves present through the artifact.  ____________________________________________  RADIOLOGY   Dg Chest Port 1 View  11/26/2014  CLINICAL DATA:  Sepsis.  Back pain EXAM: PORTABLE CHEST 1 VIEW COMPARISON:  A 15 2016 FINDINGS: The heart size and mediastinal contours are within normal limits. Both lungs are clear. The visualized skeletal structures are unremarkable. IMPRESSION: No active disease. Electronically Signed   By: Franchot Gallo M.D.   On: 11/26/2014 07:11    ____________________________________________   PROCEDURES  Procedure(s) performed: None  Critical Care performed: Yes, see critical care note(s)  CRITICAL CARE Performed by: Hinda Kehr   Total critical care time: 45 minutes  Critical care time was exclusive of separately billable procedures and treating other patients.  Critical care was necessary to treat or prevent imminent or life-threatening deterioration.  Critical care was time spent personally by me on the following activities: development of treatment plan with patient and/or surrogate as well as nursing, discussions with consultants, evaluation of patient's response to treatment, examination of patient, obtaining history from patient or surrogate, ordering and performing treatments and interventions, ordering and review of laboratory studies, ordering and review of radiographic studies, pulse oximetry and re-evaluation of patient's condition.  ____________________________________________   INITIAL IMPRESSION / ASSESSMENT AND PLAN / ED COURSE  Pertinent labs & imaging results that were available during my care of the patient were reviewed by me and considered in my medical decision making (see chart for details).  Presumed sepsis given hypothermia and altered mental status, although the  altered mental status may be mostly the result of her hypoglycemia.  I am performing a full septic workup and will reassess patient.  After results are back.  She is currently on a Retail banker for warmth.  ----------------------------------------- 8:42 AM on 11/26/2014 -----------------------------------------  Patient's mental status has resolved.  She is no longer shivering though her Foley temp remains at approximately 80F.  She has an elevated lactate and a UTI which suggest sepsis as the cause of her hypothermia.  She was given empiric broad-spectrum antibiotics when she arrived as well as 30 mL/kg of formed normal saline.  She is denying any symptoms at this time and is hemodynamically stable after the initial hypotension.  I will admit her for further management.  ____________________________________________  FINAL CLINICAL IMPRESSION(S) / ED DIAGNOSES  Final diagnoses:  Altered mental status, unspecified altered mental status type  Hypoglycemia  Hypotension, unspecified hypotension type  Hypothermia, initial encounter  UTI (lower urinary tract infection)      NEW MEDICATIONS STARTED DURING THIS VISIT:  New Prescriptions   No medications on file     Hinda Kehr, MD 11/26/14 [phone]

## 2014-11-26 NOTE — Progress Notes (Signed)
ANTIBIOTIC CONSULT NOTE - INITIAL  Pharmacy Consult forCeftriaxone Indication: UTI/?sepsis  No Known Allergies  Patient Measurements: Height: 5\' 3"  (160 cm) Weight: 160 lb (72.576 kg) IBW/kg (Calculated) : 52.4 Adjusted Body Weight:   Vital Signs: Temp: 98.2 F (36.8 C) (11/17 1553) Temp Source: Oral (11/17 1553) BP: 175/60 mmHg (11/17 1553) Pulse Rate: 81 (11/17 1553) Intake/Output from previous day:   Intake/Output from this shift: Total I/O In: 1500 [I.V.:1500] Out: 620 [Urine:620]  Labs:  Recent Labs  11/26/14 0705  WBC 8.9  HGB 13.2  PLT 296  CREATININE 1.21*   Estimated Creatinine Clearance: 32.5 mL/min (by C-G formula based on Cr of 1.21).   Microbiology: Recent Results (from the past 720 hour(s))  Blood Culture (routine x 2)     Status: None (Preliminary result)   Collection Time: 11/26/14  7:23 AM  Result Value Ref Range Status   Specimen Description BLOOD RIGHT ASSIST CONTROL  Final   Special Requests   Final    BOTTLES DRAWN AEROBIC AND ANAEROBIC  2CC ANAERO 5CC AERO   Culture NO GROWTH < 12 HOURS  Final   Report Status PENDING  Incomplete  Blood Culture (routine x 2)     Status: None (Preliminary result)   Collection Time: 11/26/14  7:23 AM  Result Value Ref Range Status   Specimen Description BLOOD LEFT ASSIST CONTROL  Final   Special Requests   Final    BOTTLES DRAWN AEROBIC AND ANAEROBIC  2CC AERO 3 CC ANAERO   Culture NO GROWTH < 12 HOURS  Final   Report Status PENDING  Incomplete  Urine culture     Status: None (Preliminary result)   Collection Time: 11/26/14  7:23 AM  Result Value Ref Range Status   Specimen Description URINE, CATHETERIZED  Final   Special Requests NONE  Final   Culture NO GROWTH <12 HOURS  Final   Report Status PENDING  Incomplete    Medical History: Past Medical History  Diagnosis Date  . Diabetes mellitus without complication ([place])   . Hypertension     Medications:  Scheduled:  . cefTRIAXone (ROCEPHIN)   IV  1 g Intravenous Once  . [START ON 11/27/2014] cefTRIAXone (ROCEPHIN)  IV  2 g Intravenous Q24H  . docusate sodium  100 mg Oral BID  . heparin  5,000 Units Subcutaneous 3 times per day  . sodium chloride  3 mL Intravenous Q12H   Anti-infectives    Start     Dose/Rate Route Frequency Ordered Stop   11/27/14 1000  cefTRIAXone (ROCEPHIN) 2 g in dextrose 5 % 50 mL IVPB     2 g 100 mL/hr over 30 Minutes Intravenous Every 24 hours 11/26/14 1643     11/26/14 1645  cefTRIAXone (ROCEPHIN) 1 g in dextrose 5 % 50 mL IVPB     1 g 100 mL/hr over 30 Minutes Intravenous  Once 11/26/14 1639     11/26/14 1600  cefTRIAXone (ROCEPHIN) 2 g in dextrose 5 % 50 mL IVPB  Status:  Discontinued     2 g 100 mL/hr over 30 Minutes Intravenous  Once 11/26/14 1552 11/26/14 1639   11/26/14 1500  piperacillin-tazobactam (ZOSYN) IVPB 3.375 g  Status:  Discontinued     3.375 g 12.5 mL/hr over 240 Minutes Intravenous 3 times per day 11/26/14 1503 11/26/14 1506   11/26/14 0830  cefTRIAXone (ROCEPHIN) 1 g in dextrose 5 % 50 mL IVPB     1 g 100 mL/hr over 30 Minutes Intravenous  STAT 11/26/14 0827 11/26/14 1057   11/26/14 0700  piperacillin-tazobactam (ZOSYN) IVPB 3.375 g     3.375 g 100 mL/hr over 30 Minutes Intravenous  Once 11/26/14 0656 11/26/14 0748   11/26/14 0700  vancomycin (VANCOCIN) IVPB 1000 mg/200 mL premix     1,000 mg 200 mL/hr over 60 Minutes Intravenous  Once 11/26/14 0656 11/26/14 0919     Assessment: 81 yo female with AMS,UTI, ?sepsis. Patient received Zosyn/Vancomycin in ER, now transitioning to Ceftriaxone. Hx DM   Plan:  Patient received Ceftriaxone 1 gram x1 in ER. Will order 1 gram IV x 1 for a total dose of 2 grams today. Will order Ceftriaxone 2 gram IV Q24h. F/u Urine cx, sepsis status.  Chinita Greenland PharmD Clinical Pharmacist 11/26/2014 4:46 PM

## 2014-11-26 NOTE — Plan of Care (Signed)
Problem: Safety: Goal: Ability to remain free from injury will improve Outcome: Progressing Fall precautions in place     

## 2014-11-27 LAB — BASIC METABOLIC PANEL
ANION GAP: 5 (ref 5–15)
BUN: 20 mg/dL (ref 6–20)
CO2: 27 mmol/L (ref 22–32)
Calcium: 8 mg/dL — ABNORMAL LOW (ref 8.9–10.3)
Chloride: 109 mmol/L (ref 101–111)
Creatinine, Ser: 1.12 mg/dL — ABNORMAL HIGH (ref 0.44–1.00)
GFR calc Af Amer: 50 mL/min — ABNORMAL LOW (ref >60)
GFR calc non Af Amer: 44 mL/min — ABNORMAL LOW (ref >60)
GLUCOSE: 173 mg/dL — AB (ref 65–99)
POTASSIUM: 4 mmol/L (ref 3.5–5.1)
Sodium: 141 mmol/L (ref 135–145)

## 2014-11-27 LAB — GLUCOSE, CAPILLARY
GLUCOSE-CAPILLARY: 219 mg/dL — AB (ref 65–99)
GLUCOSE-CAPILLARY: 213 mg/dL — AB (ref 65–99)
Glucose-Capillary: 198 mg/dL — ABNORMAL HIGH (ref 65–99)
Glucose-Capillary: 203 mg/dL — ABNORMAL HIGH (ref 65–99)

## 2014-11-27 LAB — LACTIC ACID, PLASMA: Lactic Acid, Venous: 1.7 mmol/L (ref 0.5–2.0)

## 2014-11-27 LAB — CBC
HEMATOCRIT: 34.1 % — AB (ref 35.0–47.0)
Hemoglobin: 11 g/dL — ABNORMAL LOW (ref 12.0–16.0)
MCH: 28.5 pg (ref 26.0–34.0)
MCHC: 32.1 g/dL (ref 32.0–36.0)
MCV: 88.8 fL (ref 80.0–100.0)
Platelets: 235 10*3/uL (ref 150–440)
RBC: 3.84 MIL/uL (ref 3.80–5.20)
RDW: 14.5 % (ref 11.5–14.5)
WBC: 7.2 10*3/uL (ref 3.6–11.0)

## 2014-11-27 LAB — HEMOGLOBIN A1C: HEMOGLOBIN A1C: 5.8 % (ref 4.0–6.0)

## 2014-11-27 LAB — CORTISOL: Cortisol, Plasma: 6.8 ug/dL

## 2014-11-27 MED ORDER — CEPHALEXIN 500 MG PO CAPS
500.0000 mg | ORAL_CAPSULE | Freq: Two times a day (BID) | ORAL | Status: DC
  Administered 2014-11-27: 500 mg via ORAL
  Filled 2014-11-27: qty 1

## 2014-11-27 MED ORDER — METFORMIN HCL 500 MG PO TABS
1000.0000 mg | ORAL_TABLET | Freq: Two times a day (BID) | ORAL | Status: DC
  Administered 2014-11-27 – 2014-11-28 (×2): 1000 mg via ORAL
  Filled 2014-11-27 (×2): qty 2

## 2014-11-27 NOTE — Care Management Important Message (Signed)
Important Message  Patient Details  Name: Sherry [name] MRN: [id number] Date of Birth: 09-11-1927   Medicare Important Message Given:  Yes    Katrina Stack, RN 11/27/2014, 5:47 PM

## 2014-11-27 NOTE — Progress Notes (Signed)
Inpatient Diabetes Program Recommendations  AACE/ADA: New Consensus Statement on Inpatient Glycemic Control (2015)  Target Ranges:  Prepandial:   less than 140 mg/dL      Peak postprandial:   less than 180 mg/dL (1-2 hours)      Critically ill patients:  140 - 180 mg/dL    Results for Sherry [name], Sherry [name] (MRN [id number]) as of 11/27/2014 10:30  Ref. Range 11/26/2014 06:47 11/26/2014 16:43 11/26/2014 20:24 11/27/2014 07:25  Glucose-Capillary Latest Ref Range: 65-99 mg/dL 164 (H) 91 213 (H) 198 (H)    Home DM Meds: NPH insulin 20 units BID       Metformin 1000 mg BID  Current Orders: None yet.  Checking CBGs TID AC + HS   Inpatient Diabetes Program Recommendations: Correction (SSI): Please consider starting Novolog Sensitive SSI (0-9 units) TID AC + HS if patient's CBGs begin to rise consistently above 180 mg/dl     --Will follow patient during hospitalization--  Wyn Quaker RN, MSN, CDE Diabetes Coordinator Inpatient Glycemic Control Team Team Pager: [phone] (8a-5p)

## 2014-11-27 NOTE — Consult Note (Signed)
ENDOCRINOLOGY CONSULTATION  REFERRING PHYSICIAN: Max Sane, MD CONSULTING PHYSICIAN:  A. Lavone Orn, MD. PRIMARY CARE PROVIDER:  Montey Hora  CHIEF COMPLAINT:  Diabetes mellitus  HISTORY OF PRESENT ILLNESS:  81 y.o. female with type 2 diabetes who is seen in consultation at the request of Dr Manuella Ghazi for concerns of hypoglycemia. She has had diabetes x6-7 years. Diabetes managed at home with NPH 20 units BID and metformin 1000 mg BID. She denies recent dose changes. Not aware of last Hgb A1c as out-patient. Hgb A1c yesterday was 5.8%. She checks sugars about 2 times weeky and recalls sugars range 78 - 186. Denies having had hypoglycemia in recent weeks. Yesterday morning her son couldn't wake her and he called EMS and her fingerstick blood glucose was reportedly 50. She was treated at the scene with 1 am D50 and repeat blood glucose was 393. She was then brought to the [facility] ER. Initial fingerstick blood glucose in the ER was 164. She was hypertensive with a BP of 174/91 and hypothermic with  Rectal temp of 92.1. Labs revealed a normal WBC with mildly elevated lactic acid of 2.6 mmol/L (ref range 5-2.0). She was started on IV dextrose and home diabetes medications have been held. No further hypoglycemia since admission. Blood sugars today was 203 (pre-lunch) and IV dextrose was stopped. A repeat blood sugar pre-supper was 219.  She denies recent illness. No recent change in diet. No symptoms in last few days including GI upset / N / V / cough / weakness / fever. She has had a good appetite with no skipped meals. Manages her own medications and denies having potentially taken excess NPH insulin. Now feeling well. Eating 100% of dietary tray.  PAST MEDICAL HISTORY:  Past Medical History  Diagnosis Date  . Diabetes mellitus without complication ([place])   . Hypertension      CURRENT MEDICATIONS:  . cephALEXin  500 mg Oral Q12H  . docusate sodium  100 mg  Oral BID  . heparin  5,000 Units Subcutaneous 3 times per day  . sodium chloride  3 mL Intravenous Q12H     SOCIAL HISTORY:  Social History  Substance Use Topics  . Smoking status: Never Smoker   . Smokeless tobacco: None  . Alcohol Use: No     FAMILY HISTORY:   Mother and daughter both with diabetes.  ALLERGIES:  No Known Allergies  REVIEW OF SYSTEMS:  GENERAL:  No weight loss.  No fever.  HEENT:  No blurred vision. No sore throat.  NECK:  No neck pain or dysphagia.  CARDIAC:  No chest pain or palpitation.  PULMONARY:  No cough or shortness of breath.  ABDOMEN:  No abdominal pain.  No constipation. EXTREMITIES:  No lower extremity swelling.  ENDOCRINE:  No heat or cold intolerance.  GENITOURINARY:  No dysuria or hematuria. SKIN:  No recent rash or skin changes.   PHYSICAL EXAMINATION:  BP 146/47 mmHg  Pulse 75  Temp(Src) 97.8 F (36.6 C) (Oral)  Resp 20  Ht 5' 5"  (1.651 m)  Wt 78.608 kg (173 lb 4.8 oz)  BMI 28.84 kg/m2  SpO2 94%  GENERAL:  Well-developed elderly white female in NAD. HEENT:  EOMI.  Oropharynx is clear.  NECK:  Supple.  No thyromegaly.  No neck tenderness.  CARDIAC:  Regular rate and rhythm without murmur.  PULMONARY:  Clear to auscultation bilaterally. No wheeze. ABDOMEN:  Diffusely soft, nontender, nondistended.  EXTREMITIES:  No peripheral edema is present.    SKIN:  No rash or dermatopathy. NEUROLOGIC:  No dysarthria.  No tremor. PSYCHIATRIC:  Alert and oriented, calm, cooperative.   LABORATORY DATA:  Results for orders placed or performed during the hospital encounter of 11/26/14 (from the past 24 hour(s))  Urine culture     Status: None (Preliminary result)   Collection Time: 11/26/14  6:30 PM  Result Value Ref Range   Specimen Description URINE, RANDOM    Special Requests NONE    Culture NO GROWTH < 24 HOURS    Report Status PENDING   Cortisol     Status: None   Collection Time: 11/26/14  7:53 PM  Result Value Ref Range    Cortisol, Plasma 6.8 ug/dL  Hemoglobin A1c     Status: None   Collection Time: 11/26/14  7:53 PM  Result Value Ref Range   Hgb A1c MFr Bld 5.8 4.0 - 6.0 %  Lactic acid, plasma     Status: Abnormal   Collection Time: 11/26/14  7:53 PM  Result Value Ref Range   Lactic Acid, Venous 2.5 (HH) 0.5 - 2.0 mmol/L  Procalcitonin     Status: None   Collection Time: 11/26/14  7:53 PM  Result Value Ref Range   Procalcitonin <0.10 ng/mL  TSH     Status: None   Collection Time: 11/26/14  7:53 PM  Result Value Ref Range   TSH 2.791 0.350 - 4.500 uIU/mL  Glucose, capillary     Status: Abnormal   Collection Time: 11/26/14  8:24 PM  Result Value Ref Range   Glucose-Capillary 213 (H) 65 - 99 mg/dL  Culture, blood (x 2)     Status: None (Preliminary result)   Collection Time: 11/26/14 11:58 PM  Result Value Ref Range   Specimen Description BLOOD LEFT HAND    Special Requests BOTTLES DRAWN AEROBIC AND ANAEROBIC 5ML    Culture NO GROWTH < 12 HOURS    Report Status PENDING   Culture, blood (x 2)     Status: None (Preliminary result)   Collection Time: 11/26/14 11:59 PM  Result Value Ref Range   Specimen Description BLOOD LEFT HAND    Special Requests BOTTLES DRAWN AEROBIC AND ANAEROBIC 5ML    Culture NO GROWTH < 12 HOURS    Report Status PENDING   Lactic acid, plasma     Status: None   Collection Time: 11/27/14 12:00 AM  Result Value Ref Range   Lactic Acid, Venous 1.7 0.5 - 2.0 mmol/L  Basic metabolic panel     Status: Abnormal   Collection Time: 11/27/14 12:01 AM  Result Value Ref Range   Sodium 141 135 - 145 mmol/L   Potassium 4.0 3.5 - 5.1 mmol/L   Chloride 109 101 - 111 mmol/L   CO2 27 22 - 32 mmol/L   Glucose, Bld 173 (H) 65 - 99 mg/dL   BUN 20 6 - 20 mg/dL   Creatinine, Ser 1.12 (H) 0.44 - 1.00 mg/dL   Calcium 8.0 (L) 8.9 - 10.3 mg/dL   GFR calc non Af Amer 44 (L) >60 mL/min   GFR calc Af Amer 50 (L) >60 mL/min   Anion gap 5 5 - 15  CBC     Status: Abnormal   Collection Time:  11/27/14 12:01 AM  Result Value Ref Range   WBC 7.2 3.6 - 11.0 K/uL   RBC 3.84 3.80 - 5.20 MIL/uL   Hemoglobin 11.0 (L) 12.0 - 16.0 g/dL   HCT 34.1 (L) 35.0 - 47.0 %  MCV 88.8 80.0 - 100.0 fL   MCH 28.5 26.0 - 34.0 pg   MCHC 32.1 32.0 - 36.0 g/dL   RDW 14.5 11.5 - 14.5 %   Platelets 235 150 - 440 K/uL  Glucose, capillary     Status: Abnormal   Collection Time: 11/27/14  7:25 AM  Result Value Ref Range   Glucose-Capillary 198 (H) 65 - 99 mg/dL  Glucose, capillary     Status: Abnormal   Collection Time: 11/27/14 11:01 AM  Result Value Ref Range   Glucose-Capillary 203 (H) 65 - 99 mg/dL    ASSESSMENT:  1. Hypoglycemia in setting of type 2 diabetes on insulin 2.   Stage 3 CKD (eGFR 44)  PLAN: 1. Cause of hypoglycemia likely due to taking insulin and not taking in adequate carbohydrates the day / evening prior. Work-up for infectious cause of hypoglycemia has been negative. At her advanced age, her A1c is fairly low. It would be prudent given this episode of severe hypoglycemia, to modify medications and aim for a higher A1c (7-7%) to avoid additional hypoglycemia.  2. The cortisol level of 6 obtained as an evening level is not meaningful and she gives no history to suggest adrenal insufficiency, thus cosyntropin stim test not needed.  3. Restart metformin 1000 mg bid. Monitor eGFR and stop metformin if eGFR <30. 4. Hold insulin. Defer to primary care provider in out-patient setting whether to restart in upcoming weeks.  I am unavailable to see this patient over the upcoming weekend but will return on Monday 11/30/14 and can be available at that time for follow up.

## 2014-11-27 NOTE — Progress Notes (Addendum)
Notified Dr. Gabriel Carina via telephone that fsbs for dinner was 219 and that FSBS monitoring parameters had expired, Per MD okay to renew orders, MD will enter insulin orders and patient should receive first dose at 21:00 .

## 2014-11-27 NOTE — Plan of Care (Signed)
Problem: Fluid Volume: Goal: Ability to maintain a balanced intake and output will improve Outcome: Progressing Pt is alert and oriented, confused at times, denies pain, Foley d/c, awaiting on urine output, daughter visiting at bedside, fair appetite, IV dextrose decreased to 10 cc/ hr, fsbs elevated throughout shift, consulted with endocrinologist, started on metformin, vital signs stable, temperature stabilized, closer to baseline per daughter, uneventful shift.

## 2014-11-27 NOTE — Care Management (Signed)
Patient presents from home.  Lives with her son.  Says she is independent in all her adls.  Verbalizes knowledge of performing finger sticks for glucose monitoring but says she only does it twice weekly.  Discussed checking it at least daily or twice daily due to low blood sugar on admission.  Patient says she is followed at [facility] but not sure of name.  She denies issues obtaining her medications, transportation or obtaining medicatoins

## 2014-11-27 NOTE — Progress Notes (Signed)
ANTIBIOTIC CONSULT NOTE - FOLLOW UP   Pharmacy Consult forCeftriaxone Indication: UTI/?sepsis  No Known Allergies  Patient Measurements: Height: 5\' 5"  (165.1 cm) Weight: 173 lb 4.8 oz (78.608 kg) IBW/kg (Calculated) : 57 Adjusted Body Weight:   Vital Signs: Temp: 97.8 F (36.6 C) (11/18 1103) Temp Source: Oral (11/18 1103) BP: 146/47 mmHg (11/18 1103) Pulse Rate: 75 (11/18 1103) Intake/Output from previous day: 11/17 0701 - 11/18 0700 In: 4473.3 [P.O.:240; I.V.:2733.3; IV Piggyback:1050] Out: 1870 [Urine:1870] Intake/Output from this shift: Total I/O In: 170 [P.O.:120; IV Piggyback:50] Out: -   Labs:  Recent Labs  11/26/14 0705 11/27/14 0001  WBC 8.9 7.2  HGB 13.2 11.0*  PLT 296 235  CREATININE 1.21* 1.12*   Estimated Creatinine Clearance: 38 mL/min (by C-G formula based on Cr of 1.12).   Microbiology: Recent Results (from the past 720 hour(s))  Blood Culture (routine x 2)     Status: None (Preliminary result)   Collection Time: 11/26/14  7:23 AM  Result Value Ref Range Status   Specimen Description BLOOD RIGHT ASSIST CONTROL  Final   Special Requests   Final    BOTTLES DRAWN AEROBIC AND ANAEROBIC  2CC ANAERO 5CC AERO   Culture NO GROWTH < 24 HOURS  Final   Report Status PENDING  Incomplete  Blood Culture (routine x 2)     Status: None (Preliminary result)   Collection Time: 11/26/14  7:23 AM  Result Value Ref Range Status   Specimen Description BLOOD LEFT ASSIST CONTROL  Final   Special Requests   Final    BOTTLES DRAWN AEROBIC AND ANAEROBIC  2CC AERO 3 CC ANAERO   Culture NO GROWTH < 24 HOURS  Final   Report Status PENDING  Incomplete  Urine culture     Status: None (Preliminary result)   Collection Time: 11/26/14  7:23 AM  Result Value Ref Range Status   Specimen Description URINE, CATHETERIZED  Final   Special Requests NONE  Final   Culture   Final    >=100,000 COLONIES/mL GRAM NEGATIVE RODS IDENTIFICATION AND SUSCEPTIBILITIES TO FOLLOW    Report Status PENDING  Incomplete  Urine culture     Status: None (Preliminary result)   Collection Time: 11/26/14  6:30 PM  Result Value Ref Range Status   Specimen Description URINE, RANDOM  Final   Special Requests NONE  Final   Culture NO GROWTH < 24 HOURS  Final   Report Status PENDING  Incomplete  Culture, blood (x 2)     Status: None (Preliminary result)   Collection Time: 11/26/14 11:58 PM  Result Value Ref Range Status   Specimen Description BLOOD LEFT HAND  Final   Special Requests BOTTLES DRAWN AEROBIC AND ANAEROBIC 5ML  Final   Culture NO GROWTH < 12 HOURS  Final   Report Status PENDING  Incomplete  Culture, blood (x 2)     Status: None (Preliminary result)   Collection Time: 11/26/14 11:59 PM  Result Value Ref Range Status   Specimen Description BLOOD LEFT HAND  Final   Special Requests BOTTLES DRAWN AEROBIC AND ANAEROBIC 5ML  Final   Culture NO GROWTH < 12 HOURS  Final   Report Status PENDING  Incomplete    Medical History: Past Medical History  Diagnosis Date  . Diabetes mellitus without complication ([place])   . Hypertension     Medications:  Scheduled:  . cefTRIAXone (ROCEPHIN)  IV  2 g Intravenous Q24H  . docusate sodium  100 mg Oral BID  .  heparin  5,000 Units Subcutaneous 3 times per day  . sodium chloride  3 mL Intravenous Q12H   Anti-infectives    Start     Dose/Rate Route Frequency Ordered Stop   11/27/14 1000  cefTRIAXone (ROCEPHIN) 2 g in dextrose 5 % 50 mL IVPB     2 g 100 mL/hr over 30 Minutes Intravenous Every 24 hours 11/26/14 1643     11/26/14 1645  cefTRIAXone (ROCEPHIN) 1 g in dextrose 5 % 50 mL IVPB     1 g 100 mL/hr over 30 Minutes Intravenous  Once 11/26/14 1639 11/26/14 1733   11/26/14 1600  cefTRIAXone (ROCEPHIN) 2 g in dextrose 5 % 50 mL IVPB  Status:  Discontinued     2 g 100 mL/hr over 30 Minutes Intravenous  Once 11/26/14 1552 11/26/14 1639   11/26/14 1500  piperacillin-tazobactam (ZOSYN) IVPB 3.375 g  Status:  Discontinued      3.375 g 12.5 mL/hr over 240 Minutes Intravenous 3 times per day 11/26/14 1503 11/26/14 1506   11/26/14 0830  cefTRIAXone (ROCEPHIN) 1 g in dextrose 5 % 50 mL IVPB     1 g 100 mL/hr over 30 Minutes Intravenous STAT 11/26/14 0827 11/26/14 1057   11/26/14 0700  piperacillin-tazobactam (ZOSYN) IVPB 3.375 g     3.375 g 100 mL/hr over 30 Minutes Intravenous  Once 11/26/14 0656 11/26/14 0748   11/26/14 0700  vancomycin (VANCOCIN) IVPB 1000 mg/200 mL premix     1,000 mg 200 mL/hr over 60 Minutes Intravenous  Once 11/26/14 0656 11/26/14 0919     Assessment: 81 yo female with AMS,UTI, ?sepsis. Patient received Zosyn/Vancomycin in ER, now transitioning to Ceftriaxone. Hx DM   Plan:  Will continue  Ceftriaxone 2 gram IV Q24h. F/u Urine cx, sepsis status.  Larene Beach, PharmD  Clinical Pharmacist 11/27/2014 11:57 AM

## 2014-11-27 NOTE — Progress Notes (Signed)
FSBS 209 per NT Luvenia Heller

## 2014-11-27 NOTE — Progress Notes (Signed)
[facility] [place] NAME: Sherry [name]    MR#:  [id number]  DATE OF BIRTH:  [birth year]  SUBJECTIVE:  CHIEF COMPLAINT:   Chief Complaint  Patient presents with  . Hypoglycemia    REVIEW OF SYSTEMS:  ROS  DRUG ALLERGIES:  No Known Allergies VITALS:  Blood pressure 146/47, pulse 75, temperature 97.8 F (36.6 C), temperature source Oral, resp. rate 20, height 5\' 5"  (1.651 m), weight 78.608 kg (173 lb 4.8 oz), SpO2 94 %. PHYSICAL EXAMINATION:  Physical Exam LABORATORY PANEL:   CBC  Recent Labs Lab 11/27/14 0001  WBC 7.2  HGB 11.0*  HCT 34.1*  PLT 235   ------------------------------------------------------------------------------------------------------------------ Chemistries   Recent Labs Lab 11/26/14 0705 11/27/14 0001  NA 141 141  K 4.0 4.0  CL 104 109  CO2 26 27  GLUCOSE 172* 173*  BUN 25* 20  CREATININE 1.21* 1.12*  CALCIUM 9.3 8.0*  AST 28  --   ALT 24  --   ALKPHOS 85  --   BILITOT 0.4  --    RADIOLOGY:  No results found. ASSESSMENT AND PLAN:  81 y.o. female with a known history of dependent diabetes admitted for altered mental status and initially thought to have sepsis  * suspected sepsis: present on admission, hypothermia, hypotension, Elevated lactate, AMS likely source urine. Blood and urine c/s neg so far.   * hypoglycemia: hold antiDM meds and check sugars Q 4-6 hrs, endocrine c/s - d/w dr Gabriel Carina. Now sugars above 140s - will start ssi  * UTI: based on UA, continue rocephin and urine c/s neg  * Hypothermia: resolved. Could be due to sepsis, improved with bear hugger     All the records are reviewed and case discussed with Care Management/Social Worker. Management plans discussed with the patient, family and they are in agreement.  CODE STATUS: Full Code  TOTAL TIME TAKING CARE OF THIS PATIENT: 35 minutes.   More than 50% of the time was spent in counseling/coordination of care:  YES  POSSIBLE D/C IN 1-2 DAYS, DEPENDING ON CLINICAL CONDITION.   [facility], Jameca Chumley M.D on 11/27/2014 at 2:53 PM  Between 7am to 6pm - Pager - [phone]  After 6pm go to [email or web address] - password [email or web address] [facility]  [facility]  Office  [phone]  CC: Primary care physician; Trecia Rogers MD

## 2014-11-27 NOTE — Progress Notes (Signed)
Notified Dr Manuella Ghazi in person that [place] have been elevated and to ask if foley can be removed, MD will review orders and okay to d/c foley.

## 2014-11-28 LAB — URINE CULTURE: CULTURE: NO GROWTH

## 2014-11-28 LAB — CBC
HEMATOCRIT: 37.3 % (ref 35.0–47.0)
Hemoglobin: 12 g/dL (ref 12.0–16.0)
MCH: 28.4 pg (ref 26.0–34.0)
MCHC: 32.2 g/dL (ref 32.0–36.0)
MCV: 88.4 fL (ref 80.0–100.0)
PLATELETS: 236 10*3/uL (ref 150–440)
RBC: 4.22 MIL/uL (ref 3.80–5.20)
RDW: 13.9 % (ref 11.5–14.5)
WBC: 5.3 10*3/uL (ref 3.6–11.0)

## 2014-11-28 LAB — GLUCOSE, CAPILLARY: Glucose-Capillary: 181 mg/dL — ABNORMAL HIGH (ref 65–99)

## 2014-11-28 MED ORDER — CEPHALEXIN 250 MG PO CAPS: 250.0000 mg | ORAL_CAPSULE | Freq: Four times a day (QID) | ORAL | Status: DC

## 2014-11-28 MED ORDER — HYDROCHLOROTHIAZIDE 12.5 MG PO CAPS
12.5000 mg | ORAL_CAPSULE | Freq: Every day | ORAL | Status: DC
  Administered 2014-11-28: 12.5 mg via ORAL
  Filled 2014-11-28: qty 1

## 2014-11-28 MED ORDER — LISINOPRIL 10 MG PO TABS
10.0000 mg | ORAL_TABLET | Freq: Every day | ORAL | Status: DC
Start: 2014-11-28 — End: 2014-11-28
  Administered 2014-11-28: 10 mg via ORAL
  Filled 2014-11-28: qty 1

## 2014-11-28 NOTE — Progress Notes (Signed)
Patient received discharge instructions, pt verbalized understanding. IV was removed with no signs of infection. Dressing clean, dry intact. No skin tears or wounds present. Prescription was sent to pharmacy. Patient was escorted out with staff member via wheelchair via private auto. No further needs from care management team.

## 2014-11-28 NOTE — Discharge Summary (Addendum)
[facility] at [place] NAME: Sherry [name]    MR#:  [id number]  DATE OF BIRTH:  [birth year]  DATE OF ADMISSION:  11/26/2014 ADMITTING PHYSICIAN: Max Sane, MD  DATE OF DISCHARGE: 11/28/2014  PRIMARY CARE PHYSICIAN: No primary care provider on file.    ADMISSION DIAGNOSIS:  Hypoglycemia [E16.2] UTI (lower urinary tract infection) [N39.0] Hypothermia, initial encounter [T68.XXXA] Hypotension, unspecified hypotension type [I95.9] Altered mental status, unspecified altered mental status type [R41.82]  DISCHARGE DIAGNOSIS:   HYpoglycemia, hypothermia  sepsis with UTI  SECONDARY DIAGNOSIS:   Past Medical History  Diagnosis Date  . Diabetes mellitus without complication ([place])   . Hypertension     HOSPITAL COURSE:   81 y.o. female with a known history of dependent diabetes admitted for altered mental status and initially thought to have sepsis  * suspected sepsis: present on admission, hypothermia, hypotension, Elevated lactate, AMS likely source urine. Blood  neg so far.   UA positive- and pt is better with 3 days of Abx.  * hypoglycemia: hold antiDM meds and check sugars Q 4-6 hrs, endocrine c/s - d/w dr Gabriel Carina. Now sugars above 140s - she advised to startr metformin, but stop insulin.   Target HBA1c is > 7 for her due to old age.  * UTI: based on UA, continued rocephin - given 2 days of keflex on d/c  * Hypothermia: resolved. Could be due to sepsis, improved with bear hugger  DISCHARGE CONDITIONS:   Stable.  CONSULTS OBTAINED:  Treatment Team:  Flora Lipps, MD Abby Percell Locus, MD  DRUG ALLERGIES:  No Known Allergies  DISCHARGE MEDICATIONS:   Current Discharge Medication List    START taking these medications   Details  cephALEXin (KEFLEX) 250 MG capsule Take 1 capsule (250 mg total) by mouth 4 (four) times daily. Qty: 8 capsule, Refills: 0      CONTINUE these medications which have NOT CHANGED    Details  diclofenac sodium (VOLTAREN) 1 % GEL Apply 2 g topically 4 (four) times daily.    lisinopril-hydrochlorothiazide (PRINZIDE,ZESTORETIC) 10-12.5 MG per tablet Take 1 tablet by mouth daily.    metFORMIN (GLUCOPHAGE) 1000 MG tablet Take 1,000 mg by mouth 2 (two) times daily.    oxyCODONE-acetaminophen (PERCOCET/ROXICET) 5-325 MG tablet Take 1 tablet by mouth every 4 (four) hours as needed for moderate pain or severe pain.      STOP taking these medications     insulin NPH Human (HUMULIN N,NOVOLIN N) 100 UNIT/ML injection          DISCHARGE INSTRUCTIONS:    Follow with Endocrine clinic in 2 weeks,  If you experience worsening of your admission symptoms, develop shortness of breath, life threatening emergency, suicidal or homicidal thoughts you must seek medical attention immediately by calling 911 or calling your MD immediately  if symptoms less severe.  You Must read complete instructions/literature along with all the possible adverse reactions/side effects for all the Medicines you take and that have been prescribed to you. Take any new Medicines after you have completely understood and accept all the possible adverse reactions/side effects.   Please note  You were cared for by a hospitalist during your hospital stay. If you have any questions about your discharge medications or the care you received while you were in the hospital after you are discharged, you can call the unit and asked to speak with the hospitalist on call if the hospitalist that took care of you is not  available. Once you are discharged, your primary care physician will handle any further medical issues. Please note that NO REFILLS for any discharge medications will be authorized once you are discharged, as it is imperative that you return to your primary care physician (or establish a relationship with a primary care physician if you do not have one) for your aftercare needs so that they can reassess your need  for medications and monitor your lab values.    Today   CHIEF COMPLAINT:   Chief Complaint  Patient presents with  . Hypoglycemia    HISTORY OF PRESENT ILLNESS:  Sherry [name]  is a 81 y.o. female with a known history of dependent diabetes admitted for altered mental status and initially thought to have sepsis. Reportedly she was found by family member at home lying sideways in bed, diaphoretic, and not responding. When EMS arrived they found that her fingerstick blood sugar was 50 and she was givenan ampule of D50. Afterwards her blood sugar increased to nearly 400, but she remained somewhat altered and began shivering violently. Her rectal temp upon arrival in the emergency department was 92, she was hypotensive at 88/50, and she remained altered and shivering violently. However, she was verbal and able to deny any specific complaints including denying headache, shortness of breath, chest pain, abdominal pain, nausea/vomiting. Overall her symptoms were severe and the timing/onset of the symptoms is unknown. after several liters of fluids - her BP improved and with bear hugger her temp is coming up so is her mental status.   VITAL SIGNS:  Blood pressure 156/49, pulse 79, temperature 97.8 F (36.6 C), temperature source Oral, resp. rate 18, height 5\' 5"  (1.651 m), weight 78.608 kg (173 lb 4.8 oz), SpO2 96 %.  I/O:    Intake/Output Summary (Last 24 hours) at 11/28/14 1044 Last data filed at 11/28/14 0808  Gross per 24 hour  Intake    780 ml  Output   2225 ml  Net  -1445 ml    PHYSICAL EXAMINATION:  GENERAL:  81 y.o.-year-old patient lying in the bed with no acute distress.  EYES: Pupils equal, round, reactive to light and accommodation. No scleral icterus. Extraocular muscles intact.  HEENT: Head atraumatic, normocephalic. Oropharynx and nasopharynx clear.  NECK:  Supple, no jugular venous distention. No thyroid enlargement, no tenderness.  LUNGS: Normal breath sounds  bilaterally, no wheezing, rales,rhonchi or crepitation. No use of accessory muscles of respiration.  CARDIOVASCULAR: S1, S2 normal. No murmurs, rubs, or gallops.  ABDOMEN: Soft, non-tender, non-distended. Bowel sounds present. No organomegaly or mass.  EXTREMITIES: No pedal edema, cyanosis, or clubbing.  NEUROLOGIC: Cranial nerves II through XII are intact. Muscle strength 5/5 in all extremities. Sensation intact. Gait not checked.  PSYCHIATRIC: The patient is alert and oriented x 3.  SKIN: No obvious rash, lesion, or ulcer.   DATA REVIEW:   CBC  Recent Labs Lab 11/28/14 0453  WBC 5.3  HGB 12.0  HCT 37.3  PLT 236    Chemistries   Recent Labs Lab 11/26/14 0705 11/27/14 0001  NA 141 141  K 4.0 4.0  CL 104 109  CO2 26 27  GLUCOSE 172* 173*  BUN 25* 20  CREATININE 1.21* 1.12*  CALCIUM 9.3 8.0*  AST 28  --   ALT 24  --   ALKPHOS 85  --   BILITOT 0.4  --     Cardiac Enzymes  Recent Labs Lab 11/26/14 0705  TROPONINI <0.03    Microbiology Results  Results for orders placed or performed during the hospital encounter of 11/26/14  Blood Culture (routine x 2)     Status: None (Preliminary result)   Collection Time: 11/26/14  7:23 AM  Result Value Ref Range Status   Specimen Description BLOOD RIGHT ASSIST CONTROL  Final   Special Requests   Final    BOTTLES DRAWN AEROBIC AND ANAEROBIC  2CC ANAERO 5CC AERO   Culture NO GROWTH 2 DAYS  Final   Report Status PENDING  Incomplete  Blood Culture (routine x 2)     Status: None (Preliminary result)   Collection Time: 11/26/14  7:23 AM  Result Value Ref Range Status   Specimen Description BLOOD LEFT ASSIST CONTROL  Final   Special Requests   Final    BOTTLES DRAWN AEROBIC AND ANAEROBIC  2CC AERO 3 CC ANAERO   Culture NO GROWTH 2 DAYS  Final   Report Status PENDING  Incomplete  Urine culture     Status: None   Collection Time: 11/26/14  7:23 AM  Result Value Ref Range Status   Specimen Description URINE, CATHETERIZED   Final   Special Requests NONE  Final   Culture >=100,000 COLONIES/mL KLEBSIELLA OXYTOCA  Final   Report Status 11/28/2014 FINAL  Final   Organism ID, Bacteria KLEBSIELLA OXYTOCA  Final      Susceptibility   Klebsiella oxytoca - MIC*    AMPICILLIN Value in next row Resistant      RESISTANT>=32    CEFAZOLIN Value in next row Sensitive      SENSITIVE8    CEFTRIAXONE Value in next row Sensitive      SENSITIVE<=1    CIPROFLOXACIN Value in next row Sensitive      SENSITIVE<=0.25    GENTAMICIN Value in next row Sensitive      SENSITIVE<=1    IMIPENEM Value in next row Sensitive      SENSITIVE<=0.25    NITROFURANTOIN Value in next row Sensitive      SENSITIVE<=16    TRIMETH/SULFA Value in next row Sensitive      SENSITIVE<=20    PIP/TAZO Value in next row Sensitive      SENSITIVE<=4    AMPICILLIN/SULBACTAM Value in next row Sensitive      SENSITIVE8    LEVOFLOXACIN Value in next row Sensitive      SENSITIVE<=0.12    * >=100,000 COLONIES/mL KLEBSIELLA OXYTOCA  Urine culture     Status: None   Collection Time: 11/26/14  6:30 PM  Result Value Ref Range Status   Specimen Description URINE, RANDOM  Final   Special Requests NONE  Final   Culture NO GROWTH 1 DAY  Final   Report Status 11/28/2014 FINAL  Final  Culture, blood (x 2)     Status: None (Preliminary result)   Collection Time: 11/26/14 11:58 PM  Result Value Ref Range Status   Specimen Description BLOOD LEFT HAND  Final   Special Requests BOTTLES DRAWN AEROBIC AND ANAEROBIC 5ML  Final   Culture NO GROWTH 1 DAY  Final   Report Status PENDING  Incomplete  Culture, blood (x 2)     Status: None (Preliminary result)   Collection Time: 11/26/14 11:59 PM  Result Value Ref Range Status   Specimen Description BLOOD LEFT HAND  Final   Special Requests BOTTLES DRAWN AEROBIC AND ANAEROBIC 5ML  Final   Culture NO GROWTH 1 DAY  Final   Report Status PENDING  Incomplete    RADIOLOGY:  No results found.  EKG:   Orders placed or  performed during the hospital encounter of 11/26/14  . EKG 12-Lead  . EKG 12-Lead      Management plans discussed with the patient, family and they are in agreement.  CODE STATUS:     Code Status Orders        Start     Ordered   11/26/14 1553  Full code   Continuous     11/26/14 1552      TOTAL TIME TAKING CARE OF THIS PATIENT: 35 minutes.    Vaughan Basta M.D on 11/28/2014 at 10:44 AM  Between 7am to 6pm - Pager - [phone]  After 6pm go to [email or web address] - password [email or web address] [facility]  [facility]  Office  [phone]  CC: Primary care physician; No primary care provider on file.   Note: This dictation was prepared with Dragon dictation along with smaller phrase technology. Any transcriptional errors that result from this process are unintentional.

## 2014-12-02 LAB — CULTURE, BLOOD (ROUTINE X 2)
CULTURE: NO GROWTH
CULTURE: NO GROWTH
Culture: NO GROWTH
Culture: NO GROWTH

## 2015-10-20 ENCOUNTER — Encounter: Payer: Self-pay | Admitting: Emergency Medicine

## 2015-10-20 ENCOUNTER — Emergency Department
Admission: EM | Admit: 2015-10-20 | Discharge: 2015-10-20 | Disposition: A | Discharge status: Home / Self Care | Payer: Medicare Other | Attending: Emergency Medicine | Admitting: Emergency Medicine

## 2015-10-20 ENCOUNTER — Emergency Department: Payer: Medicare Other

## 2015-10-20 DIAGNOSIS — Z79899 Other long term (current) drug therapy: Secondary | ICD-10-CM | POA: Diagnosis not present

## 2015-10-20 DIAGNOSIS — E119 Type 2 diabetes mellitus without complications: Secondary | ICD-10-CM | POA: Insufficient documentation

## 2015-10-20 DIAGNOSIS — M7121 Synovial cyst of popliteal space [Baker], right knee: Secondary | ICD-10-CM | POA: Diagnosis not present

## 2015-10-20 DIAGNOSIS — Z7984 Long term (current) use of oral hypoglycemic drugs: Secondary | ICD-10-CM | POA: Diagnosis not present

## 2015-10-20 DIAGNOSIS — I10 Essential (primary) hypertension: Secondary | ICD-10-CM | POA: Insufficient documentation

## 2015-10-20 DIAGNOSIS — M79604 Pain in right leg: Secondary | ICD-10-CM | POA: Diagnosis present

## 2015-10-20 MED ORDER — NAPROXEN SODIUM 275 MG PO TABS: 275.0000 mg | ORAL_TABLET | Freq: Two times a day (BID) | ORAL | 0 refills | Status: DC

## 2015-10-20 NOTE — Discharge Instructions (Signed)
Keep right leg elevated when sitting or laying. Apply warm compresses to the area 5-10 minutes 3 times a day.

## 2015-10-20 NOTE — ED Triage Notes (Signed)
Pt presents to ED c/o RLE "knot" that she states popped while she was walking in her house yesterday. Pt states it is bigger now than it was last night though she had been keeping ice on it at home. No obvious deformity noted. Pt states no pain while at rest, no pain lifting leg up while in chair, no pain with flexion of foot. Leg is sore with ambulation.

## 2015-10-20 NOTE — ED Notes (Signed)
See triage note   States she felt a pop to right lower leg yesterday  This am leg is swollen    Pain is increased with movement

## 2015-10-20 NOTE — ED Provider Notes (Signed)
[facility] Emergency Department Provider Note   ____________________________________________   First MD Initiated Contact with Patient 10/20/15 1327     (approximate)  I have reviewed the triage vital signs and the nursing notes.   HISTORY  Chief Complaint Leg Pain    HPI Sherry [name] is a 82 y.o. female patient complain of increasing pain and edema to the right lower leg. Patient states she felt a pop in her leg when she was walking yesterday. Patient states he notices increasing edema. Patient stated no resolution with ice pack. Patient stated pain increases with ambulation. Patient denies any shortness of breath or chest pain. She rates the pain as a 7/10. Patient described the pain as "achy".   Past Medical History:  Diagnosis Date  . Diabetes mellitus without complication ([place])   . Hypertension     Patient Active Problem List   Diagnosis Date Noted  . Sepsis ([place]) 11/26/2014    Past Surgical History:  Procedure Laterality Date  . BACK SURGERY    . SHOULDER SURGERY      Prior to Admission medications   Medication Sig Start Date End Date Taking? Authorizing Provider  cephALEXin (KEFLEX) 250 MG capsule Take 1 capsule (250 mg total) by mouth 4 (four) times daily. 11/28/14   Vaughan Basta, MD  diclofenac sodium (VOLTAREN) 1 % GEL Apply 2 g topically 4 (four) times daily.    Historical Provider, MD  lisinopril-hydrochlorothiazide (PRINZIDE,ZESTORETIC) 10-12.5 MG per tablet Take 1 tablet by mouth daily.    Historical Provider, MD  metFORMIN (GLUCOPHAGE) 1000 MG tablet Take 1,000 mg by mouth 2 (two) times daily.    Historical Provider, MD  naproxen sodium (ANAPROX) 275 MG tablet Take 1 tablet (275 mg total) by mouth 2 (two) times daily with a meal. 10/20/15 10/19/16  Sable Feil, PA-C  oxyCODONE-acetaminophen (PERCOCET/ROXICET) 5-325 MG tablet Take 1 tablet by mouth every 4 (four) hours as needed for moderate pain or severe pain.     Historical Provider, MD    Allergies Review of patient's allergies indicates no known allergies.  History reviewed. No pertinent family history.  Social History Social History  Substance Use Topics  . Smoking status: Never Smoker  . Smokeless tobacco: Never Used  . Alcohol use No    Review of Systems Constitutional: No fever/chills Eyes: No visual changes. ENT: No sore throat. Cardiovascular: Denies chest pain. Respiratory: Denies shortness of breath. Gastrointestinal: No abdominal pain.  No nausea, no vomiting.  No diarrhea.  No constipation. Genitourinary: Negative for dysuria. Musculoskeletal: Negative for back pain. Skin: Negative for rash. Neurological: Negative for headaches, focal weakness or numbness. Endocrine:Diabetes and hypertension ____________________________________________   PHYSICAL EXAM:  VITAL SIGNS: ED Triage Vitals  Enc Vitals Group     BP 10/20/15 1219 (!) 154/53     Pulse Rate 10/20/15 1219 79     Resp 10/20/15 1219 16     Temp 10/20/15 1219 98.1 F (36.7 C)     Temp Source 10/20/15 1219 Oral     SpO2 10/20/15 1219 100 %     Weight 10/20/15 1221 160 lb (72.6 kg)     Height 10/20/15 1221 5\' 4"  (1.626 m)     Head Circumference --      Peak Flow --      Pain Score 10/20/15 1224 7     Pain Loc --      Pain Edu? --      Excl. in [place]? --  Constitutional: Alert and oriented. Well appearing and in no acute distress. Eyes: Conjunctivae are normal. PERRL. EOMI. Head: Atraumatic. Nose: No congestion/rhinnorhea. Mouth/Throat: Mucous membranes are moist.  Oropharynx non-erythematous. Neck: No stridor.  No cervical spine tenderness to palpation. Hematological/Lymphatic/Immunilogical: No cervical lymphadenopathy. Cardiovascular: Normal rate, regular rhythm. Grossly normal heart sounds.  Good peripheral circulation. Respiratory: Normal respiratory effort.  No retractions. Lungs CTAB. Gastrointestinal: Soft and nontender. No distention. No  abdominal bruits. No CVA tenderness. Musculoskeletal: No obvious deformity to the right lower leg. Obvious edema lower third anterior aspect of the leg. Moderate guarding with palpation.  Neurologic:  Normal speech and language. No gross focal neurologic deficits are appreciated. No gait instability. Skin:  Skin is warm, dry and intact. No rash noted. Moderate edema to lower third right leg. Psychiatric: Mood and affect are normal. Speech and behavior are normal.  ____________________________________________   LABS (all labs ordered are listed, but only abnormal results are displayed)  Labs Reviewed - No data to display ____________________________________________  EKG   ____________________________________________  RADIOLOGY  Ultrasound of right lower extremity was remarkable only for Baker's cyst. There is a small fluid collection in the right lower leg. Negative for DVT. ____________________________________________   PROCEDURES  Procedure(s) performed: None  Procedures  Critical Care performed: No  ____________________________________________   INITIAL IMPRESSION / ASSESSMENT AND PLAN / ED COURSE  Pertinent labs & imaging results that were available during my care of the patient were reviewed by me and considered in my medical decision making (see chart for details).  Baker's cyst. Discuss ultrasound from the patient. Patient given discharge Instructions.  Clinical Course     ____________________________________________   FINAL CLINICAL IMPRESSION(S) / ED DIAGNOSES  Final diagnoses:  Baker's cyst, right  Pain of right lower extremity      NEW MEDICATIONS STARTED DURING THIS VISIT:  New Prescriptions   NAPROXEN SODIUM (ANAPROX) 275 MG TABLET    Take 1 tablet (275 mg total) by mouth 2 (two) times daily with a meal.     Note:  This document was prepared using Dragon voice recognition software and may include unintentional dictation errors.      Sable Feil, PA-C 10/20/15 1451    Lisa Roca, MD 10/20/15 [phone]

## 2015-11-05 ENCOUNTER — Emergency Department
Admission: EM | Admit: 2015-11-05 | Discharge: 2015-11-05 | Disposition: A | Discharge status: Home / Self Care | Payer: Medicare Other | Attending: Student in an Organized Health Care Education/Training Program | Admitting: Student in an Organized Health Care Education/Training Program

## 2015-11-05 ENCOUNTER — Emergency Department: Payer: Medicare Other

## 2015-11-05 ENCOUNTER — Encounter: Payer: Self-pay | Admitting: Emergency Medicine

## 2015-11-05 DIAGNOSIS — R609 Edema, unspecified: Secondary | ICD-10-CM

## 2015-11-05 DIAGNOSIS — Z7984 Long term (current) use of oral hypoglycemic drugs: Secondary | ICD-10-CM | POA: Diagnosis not present

## 2015-11-05 DIAGNOSIS — M7989 Other specified soft tissue disorders: Secondary | ICD-10-CM | POA: Diagnosis present

## 2015-11-05 DIAGNOSIS — R600 Localized edema: Secondary | ICD-10-CM | POA: Insufficient documentation

## 2015-11-05 DIAGNOSIS — Z79899 Other long term (current) drug therapy: Secondary | ICD-10-CM | POA: Insufficient documentation

## 2015-11-05 DIAGNOSIS — E119 Type 2 diabetes mellitus without complications: Secondary | ICD-10-CM | POA: Insufficient documentation

## 2015-11-05 DIAGNOSIS — Z791 Long term (current) use of non-steroidal anti-inflammatories (NSAID): Secondary | ICD-10-CM | POA: Diagnosis not present

## 2015-11-05 DIAGNOSIS — I10 Essential (primary) hypertension: Secondary | ICD-10-CM | POA: Diagnosis not present

## 2015-11-05 LAB — CBC WITH DIFFERENTIAL/PLATELET
Basophils Absolute: 0 10*3/uL (ref 0–0.1)
Basophils Relative: 1 %
EOS PCT: 4 %
Eosinophils Absolute: 0.2 10*3/uL (ref 0–0.7)
HEMATOCRIT: 37 % (ref 35.0–47.0)
Hemoglobin: 12.4 g/dL (ref 12.0–16.0)
LYMPHS ABS: 1.7 10*3/uL (ref 1.0–3.6)
LYMPHS PCT: 27 %
MCH: 29.3 pg (ref 26.0–34.0)
MCHC: 33.6 g/dL (ref 32.0–36.0)
MCV: 87.1 fL (ref 80.0–100.0)
MONO ABS: 0.5 10*3/uL (ref 0.2–0.9)
Monocytes Relative: 8 %
NEUTROS ABS: 3.7 10*3/uL (ref 1.4–6.5)
Neutrophils Relative %: 60 %
PLATELETS: 182 10*3/uL (ref 150–440)
RBC: 4.24 MIL/uL (ref 3.80–5.20)
RDW: 13.9 % (ref 11.5–14.5)
WBC: 6.1 10*3/uL (ref 3.6–11.0)

## 2015-11-05 LAB — COMPREHENSIVE METABOLIC PANEL
ALT: 14 U/L (ref 14–54)
AST: 20 U/L (ref 15–41)
Albumin: 3.9 g/dL (ref 3.5–5.0)
Alkaline Phosphatase: 77 U/L (ref 38–126)
Anion gap: 9 (ref 5–15)
BILIRUBIN TOTAL: 0.5 mg/dL (ref 0.3–1.2)
BUN: 19 mg/dL (ref 6–20)
CHLORIDE: 102 mmol/L (ref 101–111)
CO2: 27 mmol/L (ref 22–32)
CREATININE: 1.05 mg/dL — AB (ref 0.44–1.00)
Calcium: 8.8 mg/dL — ABNORMAL LOW (ref 8.9–10.3)
GFR, EST AFRICAN AMERICAN: 54 mL/min — AB (ref >60)
GFR, EST NON AFRICAN AMERICAN: 47 mL/min — AB (ref >60)
Glucose, Bld: 316 mg/dL — ABNORMAL HIGH (ref 65–99)
POTASSIUM: 4.5 mmol/L (ref 3.5–5.1)
Sodium: 138 mmol/L (ref 135–145)
TOTAL PROTEIN: 6.7 g/dL (ref 6.5–8.1)

## 2015-11-05 LAB — BRAIN NATRIURETIC PEPTIDE: B NATRIURETIC PEPTIDE 5: 27 pg/mL (ref 0.0–100.0)

## 2015-11-05 LAB — TROPONIN I: Troponin I: <0.03 ng/mL (ref <0.03)

## 2015-11-05 MED ORDER — T.E.D. KNEE LENGTH/M-LONG MISC
2.0000 | Freq: Every morning | 0 refills | Status: DC
Start: 2015-11-05 — End: 2016-03-07

## 2015-11-05 NOTE — ED Notes (Signed)
The EKG was completed and signed by Dr. Robinson. The EKG was also exported into the system. 

## 2015-11-05 NOTE — ED Triage Notes (Signed)
Pt with bilateral lower leg swelling for two weeks.

## 2015-11-05 NOTE — ED Provider Notes (Signed)
[facility] Emergency Department Provider Note    First MD Initiated Contact with Patient 11/05/15 [phone]     (approximate)  I have reviewed the triage vital signs and the nursing notes.   HISTORY  Chief Complaint Leg Swelling    HPI Sherry [name] is a 82 y.o. female pleasant female with history of high blood pressure and diabetes who presents with 2 weeks of persistent bilateral lower extremity swelling. She denies any shortness of breath or chest pains. Denies any fevers. Has been seen in this ER as well as at her primary care physician's office and was told to place compression stockings on him. She is wearing compression stockings on the right leg. States that the swelling has improved in the right leg but the pain is persisting. She denies any numbness or tingling. No abdominal or back pain.  Denies any orthopnea. She is a nonsmoker. She denies any worsening of pain with motion or movement.  States that the pain is severe burning in nature   Past Medical History:  Diagnosis Date  . Diabetes mellitus without complication ([place])   . Hypertension     Patient Active Problem List   Diagnosis Date Noted  . Sepsis ([place]) 11/26/2014    Past Surgical History:  Procedure Laterality Date  . BACK SURGERY    . SHOULDER SURGERY      Prior to Admission medications   Medication Sig Start Date End Date Taking? Authorizing Provider  atorvastatin (LIPITOR) 20 MG tablet Take 1 tablet by mouth at bedtime.   Yes Historical Provider, MD  diclofenac sodium (VOLTAREN) 1 % GEL Apply 2 g topically 4 (four) times daily.   Yes Historical Provider, MD  gabapentin (NEURONTIN) 300 MG capsule Take 1 capsule by mouth at bedtime.   Yes Historical Provider, MD  glipiZIDE (GLUCOTROL XL) 10 MG 24 hr tablet Take 1 tablet by mouth daily.   Yes Historical Provider, MD  lisinopril-hydrochlorothiazide (PRINZIDE,ZESTORETIC) 10-12.5 MG per tablet Take 1 tablet by mouth daily.   Yes Historical  Provider, MD  metFORMIN (GLUCOPHAGE) 1000 MG tablet Take 1,000 mg by mouth 2 (two) times daily.   Yes Historical Provider, MD  naproxen sodium (ANAPROX) 275 MG tablet Take 1 tablet (275 mg total) by mouth 2 (two) times daily with a meal. 10/20/15 10/19/16 Yes Sable Feil, PA-C  oxyCODONE-acetaminophen (PERCOCET/ROXICET) 5-325 MG tablet Take 1 tablet by mouth every 4 (four) hours as needed for moderate pain or severe pain.   Yes Historical Provider, MD  Elastic Bandages & Supports (T.E.D. KNEE LENGTH/M-LONG) MISC 2 application by Does not apply route every morning. 11/05/15   Merlyn Lot, MD    Allergies Review of patient's allergies indicates no known allergies.  No family history on file.  Social History Social History  Substance Use Topics  . Smoking status: Never Smoker  . Smokeless tobacco: Never Used  . Alcohol use No    Review of Systems Patient denies headaches, rhinorrhea, blurry vision, numbness, shortness of breath, chest pain, edema, cough, abdominal pain, nausea, vomiting, diarrhea, dysuria, fevers, rashes or hallucinations unless otherwise stated above in HPI. ____________________________________________   PHYSICAL EXAM:  VITAL SIGNS: Vitals:   11/05/15 1000 11/05/15 1137  BP: (!) 159/62 (!) 152/71  Pulse: 68 68  Resp: (!) 22 20  Temp:      Constitutional: Alert and oriented. Well appearing and in no acute distress. Eyes: Conjunctivae are normal. PERRL. EOMI. Head: Atraumatic. Nose: No congestion/rhinnorhea. Mouth/Throat: Mucous membranes are  moist.  Oropharynx non-erythematous. Neck: No stridor. Painless ROM. No cervical spine tenderness to palpation Hematological/Lymphatic/Immunilogical: No cervical lymphadenopathy. Cardiovascular: Normal rate, regular rhythm. Grossly normal heart sounds.  Good peripheral circulation. Respiratory: Normal respiratory effort.  No retractions. Lungs CTAB. Gastrointestinal: Soft and nontender. No distention. No  abdominal bruits. No CVA tenderness. Genitourinary:  Musculoskeletal: She is bilateral lower extremity pitting edema 2+ to the knees. She has good distal perfusion with palpable PT and DP pulses bilaterally. She has no overlying rashes or erythema. She has no petechia  No joint effusions. Neurologic:  Normal speech and language. No gross focal neurologic deficits are appreciated. No gait instability. Skin:  Skin is warm, dry and intact. No rash noted. Psychiatric: Mood and affect are normal. Speech and behavior are normal.  ____________________________________________   LABS (all labs ordered are listed, but only abnormal results are displayed)  Results for orders placed or performed during the hospital encounter of 11/05/15 (from the past 24 hour(s))  Brain natriuretic peptide     Status: None   Collection Time: 11/05/15  9:06 AM  Result Value Ref Range   B Natriuretic Peptide 27.0 0.0 - 100.0 pg/mL  CBC with Differential/Platelet     Status: None   Collection Time: 11/05/15  9:13 AM  Result Value Ref Range   WBC 6.1 3.6 - 11.0 K/uL   RBC 4.24 3.80 - 5.20 MIL/uL   Hemoglobin 12.4 12.0 - 16.0 g/dL   HCT 37.0 35.0 - 47.0 %   MCV 87.1 80.0 - 100.0 fL   MCH 29.3 26.0 - 34.0 pg   MCHC 33.6 32.0 - 36.0 g/dL   RDW 13.9 11.5 - 14.5 %   Platelets 182 150 - 440 K/uL   Neutrophils Relative % 60 %   Neutro Abs 3.7 1.4 - 6.5 K/uL   Lymphocytes Relative 27 %   Lymphs Abs 1.7 1.0 - 3.6 K/uL   Monocytes Relative 8 %   Monocytes Absolute 0.5 0.2 - 0.9 K/uL   Eosinophils Relative 4 %   Eosinophils Absolute 0.2 0 - 0.7 K/uL   Basophils Relative 1 %   Basophils Absolute 0.0 0 - 0.1 K/uL  Comprehensive metabolic panel     Status: Abnormal   Collection Time: 11/05/15  9:13 AM  Result Value Ref Range   Sodium 138 135 - 145 mmol/L   Potassium 4.5 3.5 - 5.1 mmol/L   Chloride 102 101 - 111 mmol/L   CO2 27 22 - 32 mmol/L   Glucose, Bld 316 (H) 65 - 99 mg/dL   BUN 19 6 - 20 mg/dL   Creatinine,  Ser 1.05 (H) 0.44 - 1.00 mg/dL   Calcium 8.8 (L) 8.9 - 10.3 mg/dL   Total Protein 6.7 6.5 - 8.1 g/dL   Albumin 3.9 3.5 - 5.0 g/dL   AST 20 15 - 41 U/L   ALT 14 14 - 54 U/L   Alkaline Phosphatase 77 38 - 126 U/L   Total Bilirubin 0.5 0.3 - 1.2 mg/dL   GFR calc non Af Amer 47 (L) >60 mL/min   GFR calc Af Amer 54 (L) >60 mL/min   Anion gap 9 5 - 15  Troponin I     Status: None   Collection Time: 11/05/15  9:13 AM  Result Value Ref Range   Troponin I <0.03 <0.03 ng/mL   ____________________________________________  EKG My review and personal interpretation at Time: 9:18   Indication: htn  Rate: 70  Rhythm: sinus Axis: normal Other: lbbb,  no sgarbossa criteria ____________________________________________  RADIOLOGY  I personally reviewed all radiographic images ordered to evaluate for the above acute complaints and reviewed radiology reports and findings.  These findings were personally discussed with the patient.  Please see medical record for radiology report.  ____________________________________________   PROCEDURES  Procedure(s) performed: none    Critical Care performed: no ____________________________________________   INITIAL IMPRESSION / ASSESSMENT AND PLAN / ED COURSE  Pertinent labs & imaging results that were available during my care of the patient were reviewed by me and considered in my medical decision making (see chart for details).  DDX: Edema, DVT, venous insufficiency, cellulitis with edema  Sherry [name] is a 82 y.o. who presents to the ED with complaint of progressive worsening of lower extremity edema. She is afebrile hemodynamic stable. Mildly elevated blood pressure but does not have any clinical findings to suggest congestive heart failure or acute pulmonary edema. EKG shows no acute ischemia and a troponin is negative. Chest x-ray shows no evidence of volume overload with borderline cardiomegaly. Renal function is at baseline. There is no clinical  evidence of infectious process and she has good peripheral perfusion therefore I do not feel this clinically consistent with acute limits ischemia. Lower extremity duplex was ordered to evaluate for DVT. There is no evidence of acute DVT. As the patient is otherwise currently stable will discharge patient with prescription for compression stockings and follow up with primary care physician for repeat blood pressure check and further evaluation.  Have discussed with the patient and available family all diagnostics and treatments performed thus far and all questions were answered to the best of my ability. The patient demonstrates understanding and agreement with plan.   Clinical Course     ____________________________________________   FINAL CLINICAL IMPRESSION(S) / ED DIAGNOSES  Final diagnoses:  Peripheral edema      NEW MEDICATIONS STARTED DURING THIS VISIT:  Discharge Medication List as of 11/05/2015 11:44 AM    START taking these medications   Details  Elastic Bandages & Supports (T.E.D. KNEE LENGTH/M-LONG) MISC 2 application by Does not apply route every morning., Starting Fri 11/05/2015, Print         Note:  This document was prepared using Dragon voice recognition software and may include unintentional dictation errors.    Merlyn Lot, MD 11/05/15 1420

## 2015-11-05 NOTE — ED Notes (Signed)
Assisted patient to the restroom and collected a urine sample.

## 2016-01-30 ENCOUNTER — Encounter: Payer: Self-pay | Admitting: Emergency Medicine

## 2016-01-30 ENCOUNTER — Emergency Department: Payer: Medicare Other

## 2016-01-30 ENCOUNTER — Inpatient Hospital Stay
Admission: EM | Admit: 2016-01-30 | Discharge: 2016-02-07 | DRG: 871 | Disposition: A | Discharge status: Skilled Nursing Facility | Payer: Medicare Other | Attending: Internal Medicine | Admitting: Internal Medicine

## 2016-01-30 ENCOUNTER — Inpatient Hospital Stay: Payer: Medicare Other

## 2016-01-30 DIAGNOSIS — N183 Chronic kidney disease, stage 3 (moderate): Secondary | ICD-10-CM | POA: Diagnosis present

## 2016-01-30 DIAGNOSIS — B964 Proteus (mirabilis) (morganii) as the cause of diseases classified elsewhere: Secondary | ICD-10-CM | POA: Diagnosis present

## 2016-01-30 DIAGNOSIS — Z7984 Long term (current) use of oral hypoglycemic drugs: Secondary | ICD-10-CM

## 2016-01-30 DIAGNOSIS — E1122 Type 2 diabetes mellitus with diabetic chronic kidney disease: Secondary | ICD-10-CM | POA: Diagnosis present

## 2016-01-30 DIAGNOSIS — R6521 Severe sepsis with septic shock: Secondary | ICD-10-CM | POA: Diagnosis not present

## 2016-01-30 DIAGNOSIS — R112 Nausea with vomiting, unspecified: Secondary | ICD-10-CM

## 2016-01-30 DIAGNOSIS — E1165 Type 2 diabetes mellitus with hyperglycemia: Secondary | ICD-10-CM | POA: Diagnosis present

## 2016-01-30 DIAGNOSIS — A498 Other bacterial infections of unspecified site: Secondary | ICD-10-CM | POA: Diagnosis not present

## 2016-01-30 DIAGNOSIS — I129 Hypertensive chronic kidney disease with stage 1 through stage 4 chronic kidney disease, or unspecified chronic kidney disease: Secondary | ICD-10-CM | POA: Diagnosis present

## 2016-01-30 DIAGNOSIS — N179 Acute kidney failure, unspecified: Secondary | ICD-10-CM | POA: Diagnosis present

## 2016-01-30 DIAGNOSIS — E872 Acidosis: Secondary | ICD-10-CM | POA: Diagnosis not present

## 2016-01-30 DIAGNOSIS — Z23 Encounter for immunization: Secondary | ICD-10-CM

## 2016-01-30 DIAGNOSIS — R197 Diarrhea, unspecified: Secondary | ICD-10-CM | POA: Diagnosis present

## 2016-01-30 DIAGNOSIS — Z79899 Other long term (current) drug therapy: Secondary | ICD-10-CM | POA: Diagnosis not present

## 2016-01-30 DIAGNOSIS — I370 Nonrheumatic pulmonary valve stenosis: Secondary | ICD-10-CM | POA: Diagnosis not present

## 2016-01-30 DIAGNOSIS — E785 Hyperlipidemia, unspecified: Secondary | ICD-10-CM | POA: Diagnosis present

## 2016-01-30 DIAGNOSIS — Z7189 Other specified counseling: Secondary | ICD-10-CM

## 2016-01-30 DIAGNOSIS — B961 Klebsiella pneumoniae [K. pneumoniae] as the cause of diseases classified elsewhere: Secondary | ICD-10-CM | POA: Diagnosis present

## 2016-01-30 DIAGNOSIS — R0902 Hypoxemia: Secondary | ICD-10-CM | POA: Diagnosis present

## 2016-01-30 DIAGNOSIS — A4189 Other specified sepsis: Principal | ICD-10-CM | POA: Diagnosis present

## 2016-01-30 DIAGNOSIS — R627 Adult failure to thrive: Secondary | ICD-10-CM

## 2016-01-30 DIAGNOSIS — R4182 Altered mental status, unspecified: Secondary | ICD-10-CM | POA: Diagnosis not present

## 2016-01-30 DIAGNOSIS — R571 Hypovolemic shock: Secondary | ICD-10-CM | POA: Diagnosis present

## 2016-01-30 DIAGNOSIS — Z66 Do not resuscitate: Secondary | ICD-10-CM | POA: Diagnosis present

## 2016-01-30 DIAGNOSIS — A419 Sepsis, unspecified organism: Secondary | ICD-10-CM | POA: Diagnosis not present

## 2016-01-30 DIAGNOSIS — Z515 Encounter for palliative care: Secondary | ICD-10-CM

## 2016-01-30 DIAGNOSIS — D6959 Other secondary thrombocytopenia: Secondary | ICD-10-CM | POA: Diagnosis present

## 2016-01-30 DIAGNOSIS — G9340 Encephalopathy, unspecified: Secondary | ICD-10-CM | POA: Diagnosis not present

## 2016-01-30 DIAGNOSIS — G9341 Metabolic encephalopathy: Secondary | ICD-10-CM | POA: Diagnosis present

## 2016-01-30 HISTORY — DX: Hyperlipidemia, unspecified: E78.5

## 2016-01-30 LAB — LIPASE, BLOOD: LIPASE: 15 U/L (ref 11–51)

## 2016-01-30 LAB — ETHANOL

## 2016-01-30 LAB — URINALYSIS, COMPLETE (UACMP) WITH MICROSCOPIC
Bilirubin Urine: NEGATIVE
Glucose, UA: >=500 mg/dL — AB
KETONES UR: 5 mg/dL — AB
Nitrite: NEGATIVE
PH: 5 (ref 5.0–8.0)
PROTEIN: NEGATIVE mg/dL
Specific Gravity, Urine: 1.013 (ref 1.005–1.030)

## 2016-01-30 LAB — COMPREHENSIVE METABOLIC PANEL
ALT: 194 U/L — AB (ref 14–54)
ANION GAP: 12 (ref 5–15)
AST: 409 U/L — ABNORMAL HIGH (ref 15–41)
Albumin: 3.2 g/dL — ABNORMAL LOW (ref 3.5–5.0)
Alkaline Phosphatase: 128 U/L — ABNORMAL HIGH (ref 38–126)
BUN: 28 mg/dL — ABNORMAL HIGH (ref 6–20)
CHLORIDE: 99 mmol/L — AB (ref 101–111)
CO2: 24 mmol/L (ref 22–32)
Calcium: 8.4 mg/dL — ABNORMAL LOW (ref 8.9–10.3)
Creatinine, Ser: 1.51 mg/dL — ABNORMAL HIGH (ref 0.44–1.00)
GFR calc non Af Amer: 30 mL/min — ABNORMAL LOW (ref >60)
GFR, EST AFRICAN AMERICAN: 35 mL/min — AB (ref >60)
Glucose, Bld: 494 mg/dL — ABNORMAL HIGH (ref 65–99)
Potassium: 4.2 mmol/L (ref 3.5–5.1)
SODIUM: 135 mmol/L (ref 135–145)
Total Bilirubin: 0.7 mg/dL (ref 0.3–1.2)
Total Protein: 5.8 g/dL — ABNORMAL LOW (ref 6.5–8.1)

## 2016-01-30 LAB — ACETAMINOPHEN LEVEL: Acetaminophen (Tylenol), Serum: <10 ug/mL — ABNORMAL LOW (ref 10–30)

## 2016-01-30 LAB — GLUCOSE, CAPILLARY
GLUCOSE-CAPILLARY: 452 mg/dL — AB (ref 65–99)
GLUCOSE-CAPILLARY: 529 mg/dL — AB (ref 65–99)

## 2016-01-30 LAB — TROPONIN I: Troponin I: 0.05 ng/mL (ref <0.03)

## 2016-01-30 LAB — LACTIC ACID, PLASMA: LACTIC ACID, VENOUS: 2.8 mmol/L — AB (ref 0.5–1.9)

## 2016-01-30 LAB — INFLUENZA PANEL BY PCR (TYPE A & B)
INFLBPCR: NEGATIVE
Influenza A By PCR: NEGATIVE

## 2016-01-30 MED ORDER — SODIUM CHLORIDE 0.9 % IV SOLN
INTRAVENOUS | Status: DC
  Administered 2016-01-30: via INTRAVENOUS

## 2016-01-30 MED ORDER — ACETAMINOPHEN 325 MG PO TABS
650.0000 mg | ORAL_TABLET | Freq: Four times a day (QID) | ORAL | Status: DC | PRN
  Administered 2016-02-05: 21:00:00 650 mg via ORAL
  Filled 2016-01-30: qty 2

## 2016-01-30 MED ORDER — ONDANSETRON HCL 4 MG/2ML IJ SOLN
4.0000 mg | Freq: Four times a day (QID) | INTRAMUSCULAR | Status: DC | PRN
  Administered 2016-02-01 – 2016-02-05 (×7): 4 mg via INTRAVENOUS
  Filled 2016-01-30 (×7): qty 2

## 2016-01-30 MED ORDER — VANCOMYCIN HCL 500 MG IV SOLR
500.0000 mg | Freq: Once | INTRAVENOUS | Status: AC
  Administered 2016-01-31: 03:00:00 500 mg via INTRAVENOUS
  Filled 2016-01-30 (×2): qty 500

## 2016-01-30 MED ORDER — SENNOSIDES-DOCUSATE SODIUM 8.6-50 MG PO TABS: 1.0000 | ORAL_TABLET | Freq: Every evening | ORAL | Status: DC | PRN

## 2016-01-30 MED ORDER — VANCOMYCIN HCL IN DEXTROSE 1-5 GM/200ML-% IV SOLN: 1000.0000 mg | Freq: Once | INTRAVENOUS | Status: DC

## 2016-01-30 MED ORDER — PIPERACILLIN-TAZOBACTAM 3.375 G IVPB
3.3750 g | Freq: Three times a day (TID) | INTRAVENOUS | Status: DC
  Administered 2016-01-31: 03:00:00 3.375 g via INTRAVENOUS
  Filled 2016-01-30: qty 50

## 2016-01-30 MED ORDER — ALBUTEROL SULFATE (2.5 MG/3ML) 0.083% IN NEBU: 2.5000 mg | INHALATION_SOLUTION | Freq: Four times a day (QID) | RESPIRATORY_TRACT | Status: DC | PRN

## 2016-01-30 MED ORDER — PIPERACILLIN-TAZOBACTAM 3.375 G IVPB 30 MIN: 3.3750 g | Freq: Once | INTRAVENOUS | Status: DC

## 2016-01-30 MED ORDER — PIPERACILLIN-TAZOBACTAM 3.375 G IVPB 30 MIN
3.3750 g | Freq: Once | INTRAVENOUS | Status: AC
  Administered 2016-01-30: 3.375 g via INTRAVENOUS
  Filled 2016-01-30: qty 50

## 2016-01-30 MED ORDER — ENOXAPARIN SODIUM 30 MG/0.3ML ~~LOC~~ SOLN
30.0000 mg | SUBCUTANEOUS | Status: DC
  Administered 2016-01-31 (×2): 30 mg via SUBCUTANEOUS
  Filled 2016-01-30 (×2): qty 0.3

## 2016-01-30 MED ORDER — VANCOMYCIN HCL IN DEXTROSE 750-5 MG/150ML-% IV SOLN
750.0000 mg | INTRAVENOUS | Status: DC
Start: 2016-02-01 — End: 2016-01-31
  Filled 2016-01-30: qty 150

## 2016-01-30 MED ORDER — OXYCODONE HCL 5 MG PO TABS
5.0000 mg | ORAL_TABLET | ORAL | Status: DC | PRN
  Administered 2016-02-01: 5 mg via ORAL
  Filled 2016-01-30: qty 1

## 2016-01-30 MED ORDER — INSULIN ASPART 100 UNIT/ML ~~LOC~~ SOLN
0.0000 [IU] | Freq: Three times a day (TID) | SUBCUTANEOUS | Status: DC
  Filled 2016-01-30: qty 15

## 2016-01-30 MED ORDER — INSULIN ASPART 100 UNIT/ML ~~LOC~~ SOLN
0.0000 [IU] | Freq: Every day | SUBCUTANEOUS | Status: DC
  Filled 2016-01-30: qty 6

## 2016-01-30 MED ORDER — T.E.D. KNEE LENGTH/M-LONG MISC: 2.0000 "application " | Freq: Every morning | Status: DC

## 2016-01-30 MED ORDER — SODIUM CHLORIDE 0.9 % IV BOLUS (SEPSIS)
1000.0000 mL | Freq: Once | INTRAVENOUS | Status: AC
  Administered 2016-01-30: 1000 mL via INTRAVENOUS

## 2016-01-30 MED ORDER — OXYCODONE-ACETAMINOPHEN 5-325 MG PO TABS
1.0000 | ORAL_TABLET | ORAL | Status: DC | PRN
  Administered 2016-01-31 – 2016-02-06 (×3): 1 via ORAL
  Filled 2016-01-30 (×3): qty 1

## 2016-01-30 MED ORDER — GABAPENTIN 300 MG PO CAPS
300.0000 mg | ORAL_CAPSULE | Freq: Every day | ORAL | Status: DC
  Administered 2016-01-31 – 2016-02-06 (×6): 300 mg via ORAL
  Filled 2016-01-30 (×6): qty 1

## 2016-01-30 MED ORDER — IPRATROPIUM BROMIDE 0.02 % IN SOLN
0.5000 mg | Freq: Four times a day (QID) | RESPIRATORY_TRACT | Status: DC | PRN
  Filled 2016-01-30: qty 2.5

## 2016-01-30 MED ORDER — ONDANSETRON HCL 4 MG/2ML IJ SOLN
4.0000 mg | Freq: Once | INTRAMUSCULAR | Status: AC
  Administered 2016-01-30: 4 mg via INTRAVENOUS
  Filled 2016-01-30: qty 2

## 2016-01-30 MED ORDER — SODIUM CHLORIDE 0.9% FLUSH
3.0000 mL | Freq: Two times a day (BID) | INTRAVENOUS | Status: DC
  Administered 2016-01-30 – 2016-02-06 (×15): 3 mL via INTRAVENOUS

## 2016-01-30 MED ORDER — MAGNESIUM CITRATE PO SOLN: 1.0000 | Freq: Once | ORAL | Status: DC | PRN

## 2016-01-30 MED ORDER — ACETAMINOPHEN 650 MG RE SUPP
650.0000 mg | Freq: Four times a day (QID) | RECTAL | Status: DC | PRN
  Administered 2016-01-30: 650 mg via RECTAL
  Filled 2016-01-30: qty 1

## 2016-01-30 MED ORDER — ONDANSETRON HCL 4 MG PO TABS
4.0000 mg | ORAL_TABLET | Freq: Four times a day (QID) | ORAL | Status: DC | PRN
  Administered 2016-02-06: 4 mg via ORAL
  Filled 2016-01-30: qty 1

## 2016-01-30 MED ORDER — BISACODYL 5 MG PO TBEC: 5.0000 mg | DELAYED_RELEASE_TABLET | Freq: Every day | ORAL | Status: DC | PRN

## 2016-01-30 MED ORDER — VANCOMYCIN HCL IN DEXTROSE 1-5 GM/200ML-% IV SOLN
1000.0000 mg | Freq: Once | INTRAVENOUS | Status: AC
  Administered 2016-01-30: 1000 mg via INTRAVENOUS
  Filled 2016-01-30: qty 200

## 2016-01-30 NOTE — ED Triage Notes (Signed)
Patient from home via [facility]. Reports daughter came to see patient today and found her in recliner covered in stool. Reports patient is normally ambulatory and alert and oriented. Patient states she started feeling poorly 2 days ago. Upon arrival patient is alert and oriented x4.

## 2016-01-30 NOTE — ED Provider Notes (Signed)
[facility] Emergency Department Provider Note  ____________________________________________   First MD Initiated Contact with Patient 01/30/16 1525     (approximate)  I have reviewed the triage vital signs and the nursing notes.   HISTORY  Chief Complaint Altered Mental Status and Diarrhea   HPI Sherry [name] is a 83 y.o. female with a history of diabetes and hypertension is presented to the emergency department today after being found sitting in her own feces in a recliner home. The patient lives with her son, who is in the bathroom at the time. She was found by her daughter who then called medics. Per the patient, she has not been feeling well for the past 2 days. However, she says that she has only had one episode of diarrhea today as well as one episode of vomiting this morning. She says that she has pain in her low back which is chronic and unchanged. Denies any fever or body aches.  Medics also found the patient's blood sugar to be above 400. Given about 250 cc of saline en route.   Past Medical History:  Diagnosis Date  . Diabetes mellitus without complication ([place])   . Hypertension     Patient Active Problem List   Diagnosis Date Noted  . Sepsis ([place]) 11/26/2014    Past Surgical History:  Procedure Laterality Date  . BACK SURGERY    . SHOULDER SURGERY      Prior to Admission medications   Medication Sig Start Date End Date Taking? Authorizing Provider  atorvastatin (LIPITOR) 20 MG tablet Take 1 tablet by mouth at bedtime.    Historical Provider, MD  diclofenac sodium (VOLTAREN) 1 % GEL Apply 2 g topically 4 (four) times daily.    Historical Provider, MD  Elastic Bandages & Supports (T.E.D. KNEE LENGTH/M-LONG) MISC 2 application by Does not apply route every morning. 11/05/15   Merlyn Lot, MD  gabapentin (NEURONTIN) 300 MG capsule Take 1 capsule by mouth at bedtime.    Historical Provider, MD  glipiZIDE (GLUCOTROL XL) 10 MG 24 hr  tablet Take 1 tablet by mouth daily.    Historical Provider, MD  lisinopril-hydrochlorothiazide (PRINZIDE,ZESTORETIC) 10-12.5 MG per tablet Take 1 tablet by mouth daily.    Historical Provider, MD  metFORMIN (GLUCOPHAGE) 1000 MG tablet Take 1,000 mg by mouth 2 (two) times daily.    Historical Provider, MD  naproxen sodium (ANAPROX) 275 MG tablet Take 1 tablet (275 mg total) by mouth 2 (two) times daily with a meal. 10/20/15 10/19/16  Sable Feil, PA-C  oxyCODONE-acetaminophen (PERCOCET/ROXICET) 5-325 MG tablet Take 1 tablet by mouth every 4 (four) hours as needed for moderate pain or severe pain.    Historical Provider, MD    Allergies Patient has no known allergies.  No family history on file.  Social History Social History  Substance Use Topics  . Smoking status: Never Smoker  . Smokeless tobacco: Never Used  . Alcohol use No    Review of Systems Constitutional: No fever/chills Eyes: No visual changes. ENT: No sore throat. Cardiovascular: Denies chest pain. Respiratory: Denies shortness of breath. Gastrointestinal: No abdominal pain.   No constipation. Genitourinary: Negative for dysuria. Musculoskeletal:  As above Skin: Negative for rash. Neurological: Negative for headaches, focal weakness or numbness.  10-point ROS otherwise negative.  ____________________________________________   PHYSICAL EXAM:  VITAL SIGNS: ED Triage Vitals  Enc Vitals Group     BP      Pulse      Resp  Temp      Temp src      SpO2      Weight      Height      Head Circumference      Peak Flow      Pain Score      Pain Loc      Pain Edu?      Excl. in [place]?     Constitutional: Alert and oriented. in no acute distress.Still with stool on her closed. Stool is grossly brown. Eyes: Conjunctivae are normal. PERRL. EOMI. Head: Atraumatic. Nose: No congestion/rhinnorhea. Mouth/Throat: Mucous membranes are moist.   Neck: No stridor.   Cardiovascular: Normal rate, regular rhythm.  Grossly normal heart sounds.   Respiratory: Normal respiratory effort.  No retractions. Lungs CTAB. Gastrointestinal: Soft and nontender. No distention.  Musculoskeletal: No lower extremity tenderness.  Mild lower extremity edema which is equal and bilateral.  No joint effusions. Neurologic:  Normal speech and language. No gross focal neurologic deficits are appreciated.  Skin:  Skin is warm, dry and intact. No rash noted. Psychiatric: Mood and affect are normal. Speech and behavior are normal.  ____________________________________________   LABS (all labs ordered are listed, but only abnormal results are displayed)  Labs Reviewed  URINALYSIS, COMPLETE (UACMP) WITH MICROSCOPIC - Abnormal; Notable for the following:       Result Value   Color, Urine YELLOW (*)    APPearance HAZY (*)    Glucose, UA >=500 (*)    Hgb urine dipstick SMALL (*)    Ketones, ur 5 (*)    Leukocytes, UA TRACE (*)    Bacteria, UA MANY (*)    Squamous Epithelial / LPF 0-5 (*)    All other components within normal limits  COMPREHENSIVE METABOLIC PANEL - Abnormal; Notable for the following:    Chloride 99 (*)    Glucose, Bld 494 (*)    BUN 28 (*)    Creatinine, Ser 1.51 (*)    Calcium 8.4 (*)    Total Protein 5.8 (*)    Albumin 3.2 (*)    AST 409 (*)    ALT 194 (*)    Alkaline Phosphatase 128 (*)    GFR calc non Af Amer 30 (*)    GFR calc Af Amer 35 (*)    All other components within normal limits  TROPONIN I - Abnormal; Notable for the following:    Troponin I 0.05 (*)    All other components within normal limits  LACTIC ACID, PLASMA - Abnormal; Notable for the following:    Lactic Acid, Venous 2.8 (*)    All other components within normal limits  CULTURE, BLOOD (ROUTINE X 2)  CULTURE, BLOOD (ROUTINE X 2)  URINE CULTURE  INFLUENZA PANEL BY PCR (TYPE A & B)  LIPASE, BLOOD  CBC WITH DIFFERENTIAL/PLATELET  LACTIC ACID, PLASMA  ACETAMINOPHEN LEVEL  ETHANOL    ____________________________________________  EKG  ED ECG REPORT I, Parvin Stetzer,  Youlanda Roys, the attending physician, personally viewed and interpreted this ECG.   Date: 01/30/2016  EKG Time: 1634  Rate: 96  Rhythm: normal sinus rhythm  Axis: Normal axis  Intervals:first-degree A-V block   ST&T Change: After bundle branch block which is unchanged from previous. T-wave inversions in 1, 2, aVL, aVF as well as V5 and V6.  No significant change from EKG done on 11/05/2015. ____________________________________________  RADIOLOGY    DG Chest 1 View (Final result)  Result time 01/30/16 16:17:59  Final result by Lenna Sciara  Leonia Reeves, MD (01/30/16 16:17:59)           Narrative:   CLINICAL DATA: Altered mental status. Feeling poorly  EXAM: CHEST 1 VIEW  COMPARISON: None.  FINDINGS: Normal mediastinum and cardiac silhouette. Normal pulmonary vasculature. No evidence of effusion, infiltrate, or pneumothorax. No acute bony abnormality.  IMPRESSION: No acute cardiopulmonary process.   Electronically Signed By: Suzy Bouchard M.D. On: 01/30/2016 16:17          ____________________________________________   PROCEDURES  Procedure(s) performed:   Procedures  Critical Care performed:   ____________________________________________   INITIAL IMPRESSION / ASSESSMENT AND PLAN / ED COURSE  Pertinent labs & imaging results that were available during my care of the patient were reviewed by me and considered in my medical decision making (see chart for details).  ----------------------------------------- 7:02 PM on 01/30/2016 -----------------------------------------  Patient without any distress at this time. Found to have elevated lactic acid. Still pending CBC. Potential urinary tract infection with a bacteria and trace leukocytes. However, this does not appear to be convincing source because of the mild results. Patient given broad-spectrum coverage secondary to  fever as well as of a lactic acid. Signed out to Dr. Clayton Bibles. I also discussed with the patient information Hospital. She is understanding of when to comply.      ____________________________________________   FINAL CLINICAL IMPRESSION(S) / ED DIAGNOSES  Sepsis. Nausea vomiting and diarrhea.    NEW MEDICATIONS STARTED DURING THIS VISIT:  New Prescriptions   No medications on file     Note:  This document was prepared using Dragon voice recognition software and may include unintentional dictation errors.    Orbie Pyo, MD 01/30/16 [phone]

## 2016-01-30 NOTE — ED Notes (Signed)
Patient washed and changed into hospital gown and clean linen. Rectal temp taken and in and out cath performed.

## 2016-01-30 NOTE — H&P (Signed)
History and Physical   [facility] - [place] @ [facility] Admission History and Physical [employer], D.O.    Patient Name: Sherry [name] MR#: [id number] Date of Birth: [birth year] Date of Admission: 01/30/2016  Referring MD/NP/PA: Dr. Clearnce Hasten Primary Care Physician: [facility] Patient coming from: Home  Chief Complaint: AMS please note that the patient's history is limited by altered mental status. The majority of the history has been obtained from the emergency department documentation  HPI: Sherry [name] is a 83 y.o. female with a known history of DM, HTN, HLD was in a usual state of health until this evening when patient was found by her daughter to have altered mental status. Patient was found to be sitting in her own feces with diarrhea as well as 1 episode of vomiting this morning..   Otherwise there has been no change in status. Patient has been taking medication as prescribed and there has been no recent change in medication or diet.  No recent antibiotics.  There has been no recent illness, hospitalizations, travel or sick contacts.    ED Course: Patient received Zosyn, Vanco, IVFs  Review of Systems:  Unable to obtain secondary to altered mental status   Past Medical History:  Diagnosis Date  . Diabetes mellitus without complication ([place])   . Hypertension     Past Surgical History:  Procedure Laterality Date  . BACK SURGERY    . SHOULDER SURGERY       reports that she has never smoked. She has never used smokeless tobacco. She reports that she does not drink alcohol or use drugs.  No Known Allergies  No family history on file.   Prior to Admission medications   Medication Sig Start Date End Date Taking? Authorizing Provider  atorvastatin (LIPITOR) 20 MG tablet Take 1 tablet by mouth at bedtime.   Yes Historical Provider, MD  diclofenac sodium (VOLTAREN) 1 % GEL Apply 2 g topically 4 (four) times daily.   Yes Historical Provider, MD   gabapentin (NEURONTIN) 300 MG capsule Take 1 capsule by mouth at bedtime.   Yes Historical Provider, MD  glipiZIDE (GLUCOTROL XL) 10 MG 24 hr tablet Take 1 tablet by mouth daily.   Yes Historical Provider, MD  lisinopril-hydrochlorothiazide (PRINZIDE,ZESTORETIC) 10-12.5 MG per tablet Take 1 tablet by mouth daily.   Yes Historical Provider, MD  metFORMIN (GLUCOPHAGE) 1000 MG tablet Take 1,000 mg by mouth 2 (two) times daily.   Yes Historical Provider, MD  oxyCODONE-acetaminophen (PERCOCET/ROXICET) 5-325 MG tablet Take 1 tablet by mouth every 4 (four) hours as needed for moderate pain or severe pain.   Yes Historical Provider, MD  Elastic Bandages & Supports (T.E.D. KNEE LENGTH/M-LONG) MISC 2 application by Does not apply route every morning. 11/05/15   Merlyn Lot, MD  naproxen sodium (ANAPROX) 275 MG tablet Take 1 tablet (275 mg total) by mouth 2 (two) times daily with a meal. Patient not taking: Reported on 01/30/2016 10/20/15 10/19/16  Sable Feil, PA-C    Physical Exam: Vitals:   01/30/16 1528 01/30/16 1530 01/30/16 1532 01/30/16 1615  BP: (!) 142/52 (!) 137/51    Pulse: (!) 102 (!) 101    Resp: 18     Temp: 99.6 F (37.6 C)   (!) 102.7 F (39.3 C)  TempSrc: Oral   Rectal  SpO2: 91% 94%    Weight:   77.1 kg (170 lb)   Height:   5\' 3"  (1.6 m)     GENERAL: 83 y.o.-year-old  White female patient, well-developed, well-nourished lying in the bed in no acute distress.  Pleasantly confused  HEENT: Head atraumatic, normocephalic. Pupils equal, round, reactive to light and accommodation. No scleral icterus. Extraocular muscles intact. Nares are patent. Oropharynx is clear. Mucus membranes moist. NECK: Supple, No thyroid enlargement, no tenderness, no cervical lymphadenopathy. CHEST: Normal breath sounds bilaterally. No wheezing, rales, rhonchi or crackles. No use of accessory muscles of respiration.  No reproducible chest wall tenderness.  CARDIOVASCULAR: S1, S2 normal. No murmurs,  rubs, or gallops. Cap refill <2 seconds. Pulses intact distally.  ABDOMEN: Soft, nondistended, nontender. No rebound, guarding, rigidity. Normoactive bowel sounds present in all four quadrants. No organomegaly or mass. EXTREMITIES: No pedal edema, cyanosis, or clubbing. No calf tenderness or Homan's sign.  NEUROLOGIC: The patient is alert and oriented x 1. SKIN: Warm, dry, and intact without obvious rash, lesion, or ulcer.    Labs on Admission:  CBC: No results for input(s): WBC, NEUTROABS, HGB, HCT, MCV, PLT in the last 168 hours. Basic Metabolic Panel:  Recent Labs Lab 01/30/16 1720  NA 135  K 4.2  CL 99*  CO2 24  GLUCOSE 494*  BUN 28*  CREATININE 1.51*  CALCIUM 8.4*   GFR: Estimated Creatinine Clearance: 26.3 mL/min (by C-G formula based on SCr of 1.51 mg/dL (H)). Liver Function Tests:  Recent Labs Lab 01/30/16 1720  AST 409*  ALT 194*  ALKPHOS 128*  BILITOT 0.7  PROT 5.8*  ALBUMIN 3.2*    Recent Labs Lab 01/30/16 1720  LIPASE 15   No results for input(s): AMMONIA in the last 168 hours. Coagulation Profile: No results for input(s): INR, PROTIME in the last 168 hours. Cardiac Enzymes:  Recent Labs Lab 01/30/16 1720  TROPONINI 0.05*   BNP (last 3 results) No results for input(s): PROBNP in the last 8760 hours. HbA1C: No results for input(s): HGBA1C in the last 72 hours. CBG: No results for input(s): GLUCAP in the last 168 hours. Lipid Profile: No results for input(s): CHOL, HDL, LDLCALC, TRIG, CHOLHDL, LDLDIRECT in the last 72 hours. Thyroid Function Tests: No results for input(s): TSH, T4TOTAL, FREET4, T3FREE, THYROIDAB in the last 72 hours. Anemia Panel: No results for input(s): VITAMINB12, FOLATE, FERRITIN, TIBC, IRON, RETICCTPCT in the last 72 hours. Urine analysis:    Component Value Date/Time   COLORURINE YELLOW (A) 01/30/2016 1617   APPEARANCEUR HAZY (A) 01/30/2016 1617   APPEARANCEUR Clear 10/21/2012 1333   LABSPEC 1.013 01/30/2016  1617   LABSPEC 1.006 10/21/2012 1333   PHURINE 5.0 01/30/2016 1617   GLUCOSEU >=500 (A) 01/30/2016 1617   GLUCOSEU 50 mg/dL 10/21/2012 1333   HGBUR SMALL (A) 01/30/2016 1617   BILIRUBINUR NEGATIVE 01/30/2016 1617   BILIRUBINUR Negative 10/21/2012 1333   KETONESUR 5 (A) 01/30/2016 1617   PROTEINUR NEGATIVE 01/30/2016 1617   NITRITE NEGATIVE 01/30/2016 1617   LEUKOCYTESUR TRACE (A) 01/30/2016 1617   LEUKOCYTESUR Negative 10/21/2012 1333   Sepsis Labs: @LABRCNTIP (procalcitonin:4,lacticidven:4) )No results found for this or any previous visit (from the past 240 hour(s)).   Radiological Exams on Admission: Dg Chest 1 View  Result Date: 01/30/2016 CLINICAL DATA:  Altered mental status.  Feeling poorly EXAM: CHEST 1 VIEW COMPARISON:  None. FINDINGS: Normal mediastinum and cardiac silhouette. Normal pulmonary vasculature. No evidence of effusion, infiltrate, or pneumothorax. No acute bony abnormality. IMPRESSION: No acute cardiopulmonary process. Electronically Signed   By: Suzy Bouchard M.D.   On: 01/30/2016 16:17    EKG: Normal sinus rhythm at 96 bpm with normal  axis and nonspecific ST-T wave changes. First degree AV block, T-wave inversions in leads 1, 2, aVF, V5, V6.  Assessment/Plan  This is a 83 y.o. female with a history of DM, HTN, HLD now being admitted with:  1. Sepsis, unclear source - Admit to inpatient with telemetry monitoring - IV antibiotics: Vanco, Zosyn - IV fluid hydration - Follow up blood,urine & sputum cultures - Repeat CBC in am.  - Infectious disease consultation has been requested  2. AKI -Gentle IV fluid hydration and recheck BMP in a.m. -Hold lisinopril and hydrochlorothiazide  3. Hyperglycemia -Cover with regular insulin slide scale coverage - Hold metformin and glipizide  4. Transaminitis, unclear etiology -Check hepatitis panel -Hold hepatotoxic medications -Lipitor -Repeat CMP in a.m.  Admission status: Inpatient IV Fluids:  NS Diet/Nutrition: HH, CC Consults called: PT  DVT Px: Lovenox, SCDs and early ambulation. Code Status: Full Code  Disposition Plan: TBD   All the records are reviewed and case discussed with ED provider. Management plans discussed with the patient and/or family who express understanding and agree with plan of care.  Brandie Lopes D.O. on 01/30/2016 at 9:07 PM Between 7am to 6pm - Pager - [phone] After 6pm go to [email or web address] - [employer] [facility] [facility] Office [phone] CC: Primary care physician; [facility]   01/30/2016, 9:07 PM

## 2016-01-30 NOTE — Progress Notes (Signed)
Pharmacist - Prescriber Communication  Enoxaparin dose changed to 30 mg subcutaneously once daily for CrCl < 30 mL/min.  Mikaelah Trostle A. [place], [place].D., BCPS Clinical Pharmacist 01/30/2016 [phone]

## 2016-01-30 NOTE — Progress Notes (Signed)
ANTIBIOTIC CONSULT NOTE - INITIAL  Pharmacy Consult for Vancomycin/Zosyn dosing Indication: sepsis  No Known Allergies   Sherry [name] is a 83 y.o. female with a history of diabetes and hypertension is presented to the emergency department today after being found sitting in her own feces in a recliner home. The patient lives with her son, who is in the bathroom at the time. She was found by her daughter who then called medics. Per the patient, she has not been feeling well for the past 2 days. However, she says that she has only had one episode of diarrhea today as well as one episode of vomiting this morning. She says that she has pain in her low back which is chronic and unchanged. Denies any fever or body aches.  Medics also found the patient's blood sugar to be above 400. Given about 250 cc of saline en route.  Patient Measurements: Height: 5\' 3"  (160 cm) Weight: 170 lb (77.1 kg) IBW/kg (Calculated) : 52.4 Adjusted Body Weight: 62.3 kg  Vital Signs: Temp: 102.7 F (39.3 C) (01/21 1615) Temp Source: Rectal (01/21 1615) BP: 137/51 (01/21 1530) Pulse Rate: 101 (01/21 1530) Intake/Output from previous day: No intake/output data recorded. Intake/Output from this shift: Total I/O In: 250 [IV Piggyback:250] Out: -   Labs:  Recent Labs  01/30/16 1720  CREATININE 1.51*   Estimated Creatinine Clearance: 26.3 mL/min (by C-G formula based on SCr of 1.51 mg/dL (H)). No results for input(s): VANCOTROUGH, VANCOPEAK, VANCORANDOM, GENTTROUGH, GENTPEAK, GENTRANDOM, TOBRATROUGH, TOBRAPEAK, TOBRARND, AMIKACINPEAK, AMIKACINTROU, AMIKACIN in the last 72 hours.   Microbiology: No results found for this or any previous visit (from the past 720 hour(s)).  Medical History: Past Medical History:  Diagnosis Date  . Diabetes mellitus without complication ([place])   . Hypertension     Medications:  Scheduled:  . [START ON 02/01/2016] vancomycin  750 mg Intravenous Q24H   Assessment: Patient  received vanc 1g and zosyn 3.375 g in ED at [facility]. Will gave vanc 500 mg x 1 at 0230 for stacked dosing and continue with vancomycin 750 mg q24h. Will recheck VT 1/25 @ 0130 prior to 4th dose to ensure steady state levels. CrCl 26.3 ml/min Ke 0.0262 T1/2 ~ 26 hours Vd 53.97 L  Goal of Therapy:  Vancomycin trough level 15-20 mcg/ml  Plan:  Measure antibiotic drug levels at steady state  Will continue to monitor renal function.  Thank you for this consult.  Tobie Lords, PharmD, BCPS Clinical Pharmacist 01/30/2016

## 2016-01-31 ENCOUNTER — Encounter: Payer: Self-pay | Admitting: Adult Health

## 2016-01-31 ENCOUNTER — Inpatient Hospital Stay
Admit: 2016-01-31 | Discharge: 2016-01-31 | Disposition: A | Discharge status: Home / Self Care | Payer: Medicare Other | Attending: Adult Health | Admitting: Adult Health

## 2016-01-31 DIAGNOSIS — I370 Nonrheumatic pulmonary valve stenosis: Secondary | ICD-10-CM

## 2016-01-31 DIAGNOSIS — R6521 Severe sepsis with septic shock: Secondary | ICD-10-CM

## 2016-01-31 DIAGNOSIS — A419 Sepsis, unspecified organism: Secondary | ICD-10-CM

## 2016-01-31 DIAGNOSIS — A498 Other bacterial infections of unspecified site: Secondary | ICD-10-CM

## 2016-01-31 LAB — BASIC METABOLIC PANEL
Anion gap: 10 (ref 5–15)
Anion gap: 9 (ref 5–15)
Anion gap: 11 (ref 5–15)
BUN: 32 mg/dL — ABNORMAL HIGH (ref 6–20)
BUN: 33 mg/dL — AB (ref 6–20)
BUN: 35 mg/dL — ABNORMAL HIGH (ref 6–20)
CALCIUM: 7 mg/dL — AB (ref 8.9–10.3)
CHLORIDE: 110 mmol/L (ref 101–111)
CHLORIDE: 114 mmol/L — AB (ref 101–111)
CHLORIDE: 110 mmol/L (ref 101–111)
CO2: 19 mmol/L — ABNORMAL LOW (ref 22–32)
CO2: 19 mmol/L — AB (ref 22–32)
CO2: 16 mmol/L — AB (ref 22–32)
CREATININE: 2.23 mg/dL — AB (ref 0.44–1.00)
CREATININE: 2.42 mg/dL — AB (ref 0.44–1.00)
CREATININE: 2.56 mg/dL — AB (ref 0.44–1.00)
Calcium: 7.3 mg/dL — ABNORMAL LOW (ref 8.9–10.3)
Calcium: 7 mg/dL — ABNORMAL LOW (ref 8.9–10.3)
GFR calc Af Amer: 20 mL/min — ABNORMAL LOW (ref >60)
GFR calc non Af Amer: 17 mL/min — ABNORMAL LOW (ref >60)
GFR calc non Af Amer: 16 mL/min — ABNORMAL LOW (ref >60)
GFR, EST AFRICAN AMERICAN: 22 mL/min — AB (ref >60)
GFR, EST AFRICAN AMERICAN: 18 mL/min — AB (ref >60)
GFR, EST NON AFRICAN AMERICAN: 19 mL/min — AB (ref >60)
GLUCOSE: 128 mg/dL — AB (ref 65–99)
GLUCOSE: 271 mg/dL — AB (ref 65–99)
Glucose, Bld: 296 mg/dL — ABNORMAL HIGH (ref 65–99)
POTASSIUM: 3.8 mmol/L (ref 3.5–5.1)
Potassium: 3.4 mmol/L — ABNORMAL LOW (ref 3.5–5.1)
Potassium: 3.8 mmol/L (ref 3.5–5.1)
SODIUM: 139 mmol/L (ref 135–145)
Sodium: 142 mmol/L (ref 135–145)
Sodium: 137 mmol/L (ref 135–145)

## 2016-01-31 LAB — APTT
APTT: 33 s (ref 24–36)
aPTT: 44 seconds — ABNORMAL HIGH (ref 24–36)

## 2016-01-31 LAB — BLOOD CULTURE ID PANEL (REFLEXED)
Acinetobacter baumannii: NOT DETECTED
CARBAPENEM RESISTANCE: NOT DETECTED
Candida albicans: NOT DETECTED
Candida glabrata: NOT DETECTED
Candida krusei: NOT DETECTED
Candida parapsilosis: NOT DETECTED
Candida tropicalis: NOT DETECTED
ENTEROBACTER CLOACAE COMPLEX: NOT DETECTED
ENTEROBACTERIACEAE SPECIES: DETECTED — AB
ENTEROCOCCUS SPECIES: NOT DETECTED
Escherichia coli: NOT DETECTED
HAEMOPHILUS INFLUENZAE: NOT DETECTED
Klebsiella oxytoca: NOT DETECTED
Klebsiella pneumoniae: NOT DETECTED
LISTERIA MONOCYTOGENES: NOT DETECTED
NEISSERIA MENINGITIDIS: NOT DETECTED
PSEUDOMONAS AERUGINOSA: NOT DETECTED
Proteus species: DETECTED — AB
STREPTOCOCCUS AGALACTIAE: NOT DETECTED
STREPTOCOCCUS PNEUMONIAE: NOT DETECTED
STREPTOCOCCUS PYOGENES: NOT DETECTED
STREPTOCOCCUS SPECIES: NOT DETECTED
Serratia marcescens: NOT DETECTED
Staphylococcus aureus (BCID): NOT DETECTED
Staphylococcus species: NOT DETECTED

## 2016-01-31 LAB — BLOOD GAS, ARTERIAL
Acid-base deficit: 8.6 mmol/L — ABNORMAL HIGH (ref 0.0–2.0)
Bicarbonate: 17.4 mmol/L — ABNORMAL LOW (ref 20.0–28.0)
FIO2: 0.32
O2 SAT: 89.8 %
PCO2 ART: 37 mmHg (ref 32.0–48.0)
PO2 ART: 66 mmHg — AB (ref 83.0–108.0)
Patient temperature: 37
pH, Arterial: 7.28 — ABNORMAL LOW (ref 7.350–7.450)

## 2016-01-31 LAB — COMPREHENSIVE METABOLIC PANEL
ALT: 140 U/L — ABNORMAL HIGH (ref 14–54)
ANION GAP: 12 (ref 5–15)
AST: 170 U/L — AB (ref 15–41)
Albumin: 2.9 g/dL — ABNORMAL LOW (ref 3.5–5.0)
Alkaline Phosphatase: 129 U/L — ABNORMAL HIGH (ref 38–126)
BUN: 31 mg/dL — AB (ref 6–20)
CHLORIDE: 104 mmol/L (ref 101–111)
CO2: 21 mmol/L — ABNORMAL LOW (ref 22–32)
Calcium: 8.1 mg/dL — ABNORMAL LOW (ref 8.9–10.3)
Creatinine, Ser: 2.19 mg/dL — ABNORMAL HIGH (ref 0.44–1.00)
GFR calc Af Amer: 22 mL/min — ABNORMAL LOW (ref >60)
GFR, EST NON AFRICAN AMERICAN: 19 mL/min — AB (ref >60)
Glucose, Bld: 384 mg/dL — ABNORMAL HIGH (ref 65–99)
POTASSIUM: 3.5 mmol/L (ref 3.5–5.1)
Sodium: 137 mmol/L (ref 135–145)
TOTAL PROTEIN: 5.7 g/dL — AB (ref 6.5–8.1)
Total Bilirubin: 0.3 mg/dL (ref 0.3–1.2)

## 2016-01-31 LAB — GLUCOSE, CAPILLARY
GLUCOSE-CAPILLARY: 197 mg/dL — AB (ref 65–99)
GLUCOSE-CAPILLARY: 109 mg/dL — AB (ref 65–99)
GLUCOSE-CAPILLARY: 151 mg/dL — AB (ref 65–99)
GLUCOSE-CAPILLARY: 165 mg/dL — AB (ref 65–99)
GLUCOSE-CAPILLARY: 224 mg/dL — AB (ref 65–99)
GLUCOSE-CAPILLARY: 245 mg/dL — AB (ref 65–99)
GLUCOSE-CAPILLARY: 280 mg/dL — AB (ref 65–99)
Glucose-Capillary: 101 mg/dL — ABNORMAL HIGH (ref 65–99)
Glucose-Capillary: 131 mg/dL — ABNORMAL HIGH (ref 65–99)
Glucose-Capillary: 153 mg/dL — ABNORMAL HIGH (ref 65–99)
Glucose-Capillary: 215 mg/dL — ABNORMAL HIGH (ref 65–99)
Glucose-Capillary: 250 mg/dL — ABNORMAL HIGH (ref 65–99)
Glucose-Capillary: 298 mg/dL — ABNORMAL HIGH (ref 65–99)
Glucose-Capillary: 349 mg/dL — ABNORMAL HIGH (ref 65–99)
Glucose-Capillary: 352 mg/dL — ABNORMAL HIGH (ref 65–99)

## 2016-01-31 LAB — LACTIC ACID, PLASMA
LACTIC ACID, VENOUS: 5.7 mmol/L — AB (ref 0.5–1.9)
LACTIC ACID, VENOUS: 5.7 mmol/L — AB (ref 0.5–1.9)
Lactic Acid, Venous: 6.6 mmol/L (ref 0.5–1.9)
Lactic Acid, Venous: 3.5 mmol/L (ref 0.5–1.9)
Lactic Acid, Venous: 4.8 mmol/L (ref 0.5–1.9)

## 2016-01-31 LAB — C DIFFICILE QUICK SCREEN W PCR REFLEX
C DIFFICILE (CDIFF) INTERP: NOT DETECTED
C DIFFICILE (CDIFF) TOXIN: NEGATIVE
C Diff antigen: NEGATIVE

## 2016-01-31 LAB — CBC
HCT: 34 % — ABNORMAL LOW (ref 35.0–47.0)
Hemoglobin: 11.4 g/dL — ABNORMAL LOW (ref 12.0–16.0)
MCH: 29.4 pg (ref 26.0–34.0)
MCHC: 33.5 g/dL (ref 32.0–36.0)
MCV: 87.5 fL (ref 80.0–100.0)
PLATELETS: 126 10*3/uL — AB (ref 150–440)
RBC: 3.88 MIL/uL (ref 3.80–5.20)
RDW: 13.5 % (ref 11.5–14.5)
WBC: 14.3 10*3/uL — AB (ref 3.6–11.0)

## 2016-01-31 LAB — PHOSPHORUS: Phosphorus: 2.6 mg/dL (ref 2.5–4.6)

## 2016-01-31 LAB — MAGNESIUM
Magnesium: 1.4 mg/dL — ABNORMAL LOW (ref 1.7–2.4)
Magnesium: 2.2 mg/dL (ref 1.7–2.4)

## 2016-01-31 LAB — MRSA PCR SCREENING: MRSA BY PCR: NEGATIVE

## 2016-01-31 LAB — PROTIME-INR
INR: 1.1
INR: 1.19
PROTHROMBIN TIME: 14.2 s (ref 11.4–15.2)
PROTHROMBIN TIME: 15.2 s (ref 11.4–15.2)

## 2016-01-31 LAB — PROCALCITONIN

## 2016-01-31 MED ORDER — INSULIN ASPART 100 UNIT/ML ~~LOC~~ SOLN
2.0000 [IU] | SUBCUTANEOUS | Status: DC
  Administered 2016-01-31: 6 [IU] via SUBCUTANEOUS
  Filled 2016-01-31: qty 6

## 2016-01-31 MED ORDER — SODIUM CHLORIDE 0.9 % IV BOLUS (SEPSIS)
1000.0000 mL | INTRAVENOUS | Status: AC
  Administered 2016-01-31 (×2): 1000 mL via INTRAVENOUS

## 2016-01-31 MED ORDER — MAGNESIUM SULFATE 4 GM/100ML IV SOLN
4.0000 g | Freq: Once | INTRAVENOUS | Status: AC
  Administered 2016-01-31: 4 g via INTRAVENOUS
  Filled 2016-01-31: qty 100

## 2016-01-31 MED ORDER — CEFTRIAXONE SODIUM-DEXTROSE 2-2.22 GM-% IV SOLR
2.0000 g | Freq: Every day | INTRAVENOUS | Status: DC
  Administered 2016-01-31 – 2016-02-07 (×8): 2 g via INTRAVENOUS
  Filled 2016-01-31 (×8): qty 50

## 2016-01-31 MED ORDER — CLONAZEPAM 0.5 MG PO TABS: 0.2500 mg | ORAL_TABLET | Freq: Three times a day (TID) | ORAL | Status: DC | PRN

## 2016-01-31 MED ORDER — POTASSIUM CHLORIDE 20 MEQ PO PACK
40.0000 meq | PACK | Freq: Once | ORAL | Status: AC
  Administered 2016-01-31: 40 meq via ORAL
  Filled 2016-01-31: qty 2

## 2016-01-31 MED ORDER — NOREPINEPHRINE BITARTRATE 1 MG/ML IV SOLN
0.0000 ug/min | INTRAVENOUS | Status: DC
  Administered 2016-01-31: 2 ug/min via INTRAVENOUS
  Administered 2016-01-31: 28 ug/min via INTRAVENOUS
  Filled 2016-01-31 (×3): qty 4

## 2016-01-31 MED ORDER — SODIUM CHLORIDE 0.9 % IV SOLN
INTRAVENOUS | Status: DC
  Administered 2016-01-31: 11:00:00 via INTRAVENOUS

## 2016-01-31 MED ORDER — SODIUM CHLORIDE 0.9 % IV SOLN
30.0000 meq | Freq: Once | INTRAVENOUS | Status: AC
  Administered 2016-01-31: 30 meq via INTRAVENOUS
  Filled 2016-01-31: qty 15

## 2016-01-31 MED ORDER — DEXTROSE 50 % IV SOLN
25.0000 mL | INTRAVENOUS | Status: DC | PRN
Start: 2016-01-31 — End: 2016-02-07

## 2016-01-31 MED ORDER — DEXTROSE-NACL 5-0.45 % IV SOLN
INTRAVENOUS | Status: DC
  Administered 2016-01-31 (×2): via INTRAVENOUS

## 2016-01-31 MED ORDER — INSULIN ASPART 100 UNIT/ML ~~LOC~~ SOLN
15.0000 [IU] | Freq: Once | SUBCUTANEOUS | Status: AC
  Administered 2016-01-31: 15 [IU] via SUBCUTANEOUS

## 2016-01-31 MED ORDER — METRONIDAZOLE 500 MG PO TABS
500.0000 mg | ORAL_TABLET | Freq: Three times a day (TID) | ORAL | Status: DC
  Filled 2016-01-31: qty 1

## 2016-01-31 MED ORDER — HYDROCORTISONE NA SUCCINATE PF 100 MG IJ SOLR: 250.0000 mg | Freq: Once | INTRAMUSCULAR | Status: DC

## 2016-01-31 MED ORDER — SODIUM BICARBONATE 8.4 % IV SOLN
INTRAVENOUS | Status: DC
  Administered 2016-01-31 – 2016-02-02 (×3): via INTRAVENOUS
  Filled 2016-01-31 (×9): qty 150

## 2016-01-31 MED ORDER — INSULIN ASPART 100 UNIT/ML ~~LOC~~ SOLN
6.0000 [IU] | Freq: Once | SUBCUTANEOUS | Status: AC
  Administered 2016-01-31: 6 [IU] via SUBCUTANEOUS

## 2016-01-31 MED ORDER — SODIUM CHLORIDE 0.9 % IV BOLUS (SEPSIS)
1000.0000 mL | Freq: Once | INTRAVENOUS | Status: AC
  Administered 2016-01-31: 1000 mL via INTRAVENOUS

## 2016-01-31 MED ORDER — INSULIN REGULAR HUMAN 100 UNIT/ML IJ SOLN
INTRAMUSCULAR | Status: DC
  Filled 2016-01-31: qty 2.5

## 2016-01-31 MED ORDER — SODIUM CHLORIDE 0.9 % IV SOLN
INTRAVENOUS | Status: DC
  Administered 2016-01-31: 1.4 [IU]/h via INTRAVENOUS
  Filled 2016-01-31: qty 2.5

## 2016-01-31 MED ORDER — PNEUMOCOCCAL VAC POLYVALENT 25 MCG/0.5ML IJ INJ
0.5000 mL | INJECTION | INTRAMUSCULAR | Status: AC
  Administered 2016-02-01: 0.5 mL via INTRAMUSCULAR
  Filled 2016-01-31: qty 0.5

## 2016-01-31 MED ORDER — SODIUM CHLORIDE 0.9 % IV BOLUS (SEPSIS)
500.0000 mL | Freq: Once | INTRAVENOUS | Status: AC
  Administered 2016-01-31: 500 mL via INTRAVENOUS

## 2016-01-31 MED ORDER — INSULIN REGULAR BOLUS VIA INFUSION
0.0000 [IU] | Freq: Three times a day (TID) | INTRAVENOUS | Status: DC
  Filled 2016-01-31: qty 10

## 2016-01-31 MED ORDER — PIPERACILLIN-TAZOBACTAM 3.375 G IVPB
3.3750 g | Freq: Two times a day (BID) | INTRAVENOUS | Status: DC
  Administered 2016-01-31: 3.375 g via INTRAVENOUS
  Filled 2016-01-31: qty 50

## 2016-01-31 NOTE — Progress Notes (Signed)
Responded to rapid response page.  Patient lying flat in bed lethargic but arouses when stimulated. Patient on 2liters with saturation of 94. BP low lactic acid increasing per RN. RT services not needed at this time.

## 2016-01-31 NOTE — Care Management (Signed)
Message left for patient daughter [name] to call this RNCM regarding discharge planning. PT pending. RNCM will follow up with patient.

## 2016-01-31 NOTE — Significant Event (Signed)
Rapid Response Event Note  Overview:  Called to patient room for low blood pressure, lethargy and increased lactic acid.      Initial Focused Assessment: Patient responsive but lethargic, BP 82/35.  Interventions:   500 ml bolus started, orders received for patient transfer to [place] (if not transferred):  Event Summary:   at      at          [facility]

## 2016-01-31 NOTE — Progress Notes (Signed)
[facility] NOTE   Pharmacy Consult for electrolytes Indication: insulin drip  No Known Allergies  Patient Measurements: Height: 5\' 3"  (160 cm) Weight: 170 lb (77.1 kg) IBW/kg (Calculated) : 52.4  Vital Signs: Temp: 98.9 F (37.2 C) (01/22 1200) Temp Source: Axillary (01/22 1200) BP: 95/41 (01/22 1430) Pulse Rate: 98 (01/22 1430) Intake/Output from previous day: 01/21 0701 - 01/22 0700 In: [place] [IV Piggyback:1850] Out: -  Intake/Output from this shift: Total I/O In: 2252.4 [I.V.:1252.4; IV Piggyback:1000] Out: -   Labs:  Recent Labs  01/30/16 1720 01/30/16 2335 01/31/16 0312 01/31/16 0927 01/31/16 1512  WBC  --   --  14.3*  --   --   HGB  --   --  11.4*  --   --   HCT  --   --  34.0*  --   --   PLT  --   --  126*  --   --   APTT  --  33 44*  --   --   CREATININE 1.51*  --  2.19* 2.23* 2.42*  MG  --   --   --  1.4*  --   PHOS  --   --   --  2.6  --   ALBUMIN 3.2*  --  2.9*  --   --   PROT 5.8*  --  5.7*  --   --   AST 409*  --  170*  --   --   ALT 194*  --  140*  --   --   ALKPHOS 128*  --  129*  --   --   BILITOT 0.7  --  0.3  --   --    Estimated Creatinine Clearance: 16.4 mL/min (by C-G formula based on SCr of 2.42 mg/dL (H)).   Medical History: Past Medical History:  Diagnosis Date  . Diabetes mellitus without complication ([place])   . Hyperlipidemia   . Hypertension      Assessment: 83yo admitted 1/21 with AMS/diarrhea. Blood sugars have been high since admission around 400s. Will start patient on insulin drip.   Goal of Therapy:  K 3.5-5  Plan:  Will order magnesium 4g IV x 1. Will order potassium 54mEq PO x 1. Will follow with next BMP at 2100 on 1/22.   Will continue to monitor and adjust per consult.   Maykel Reitter L, PharmD 01/31/2016,4:26 PM

## 2016-01-31 NOTE — Progress Notes (Signed)
Attempted to call daughter [name] and son [name] several times for pt's update with no success, will ask secretary to attempt later during this morning.  Thanks

## 2016-01-31 NOTE — Consult Note (Signed)
[facility] Infectious Disease     Reason for Consult: Gram neg bacteremia   Referring Physician: Mortimer Fries, D Date of Admission:  01/30/2016   Active Problems:   Sepsis ([place])   HPI: LONETA TAMPLIN is a 83 y.o. female admitted with confusion after daughter found her altered and sitting in her own feces. She had been ill for one day with nv and diarrhea.  On admission her wbc was 14 and temp 103, lactate 3.5,  UA with 0-5 wbc and bcx + Proteus.  C diff neg, UCX pending SHe remains hypotensive and obtunded. [place] at bedside and provides most of the history.    Past Medical History:  Diagnosis Date  . Diabetes mellitus without complication ([place])   . Hyperlipidemia   . Hypertension    Past Surgical History:  Procedure Laterality Date  . BACK SURGERY    . SHOULDER SURGERY     Social History  Substance Use Topics  . Smoking status: Never Smoker  . Smokeless tobacco: Never Used  . Alcohol use No   No family history on file.  Allergies: No Known Allergies  Current antibiotics: Antibiotics Given (last 72 hours)    Date/Time Action Medication Dose Rate   01/31/16 0304 Given   piperacillin-tazobactam (ZOSYN) IVPB 3.375 g 3.375 g 12.5 mL/hr   01/31/16 0305 Given   vancomycin (VANCOCIN) 500 mg in sodium chloride 0.9 % 100 mL IVPB 500 mg 100 mL/hr   01/31/16 1042 Given   piperacillin-tazobactam (ZOSYN) IVPB 3.375 g 3.375 g 12.5 mL/hr   01/31/16 1925 Given   cefTRIAXone (ROCEPHIN) IVPB 2 g 2 g 100 mL/hr      MEDICATIONS: . cefTRIAXone  2 g Intravenous Daily  . enoxaparin (LOVENOX) injection  30 mg Subcutaneous Q24H  . gabapentin  300 mg Oral QHS  . insulin regular  0-10 Units Intravenous TID WC  . [START ON 02/01/2016] pneumococcal 23 valent vaccine  0.5 mL Intramuscular Tomorrow-1000  . sodium chloride flush  3 mL Intravenous Q12H    Review of Systems - unable to obtain  OBJECTIVE: Temp:  [97.6 F (36.4 C)-102.5 F (39.2 C)] 97.9 F (36.6 C) (01/22 2000) Pulse  Rate:  [88-124] 95 (01/22 2000) Resp:  [16-47] 22 (01/22 2000) BP: (56-147)/(34-111) 102/47 (01/22 2000) SpO2:  [87 %-100 %] 98 % (01/22 2000) Weight:  [77.1 kg (170 lb)] 77.1 kg (170 lb) (01/21 2340) Physical Exam  Constitutional:  Frail, criticall ill appearing HENT: [place]/AT, PERRLA, no scleral icterus Mouth/Throat: Oropharynx is clear and dry. No oropharyngeal exudate.  Cardiovascular: Tachy Pulmonary/Chest: Effort normal and breath sounds normal. No respiratory distress.  has no wheezes.  Neck = supple, no nuchal rigidity Abdominal: Soft. Mild distention Lymphadenopathy: no cervical adenopathy. No axillary adenopathy Neurological:lethargic Skin: Skin is warm and dry. No rash noted. No erythema.  Psychiatric: lethargic LABS: Results for orders placed or performed during the hospital encounter of 01/30/16 (from the past 48 hour(s))  Urinalysis, Complete w Microscopic     Status: Abnormal   Collection Time: 01/30/16  4:17 PM  Result Value Ref Range   Color, Urine YELLOW (A) YELLOW   APPearance HAZY (A) CLEAR   Specific Gravity, Urine 1.013 1.005 - 1.030   pH 5.0 5.0 - 8.0   Glucose, UA >=500 (A) NEGATIVE mg/dL   Hgb urine dipstick SMALL (A) NEGATIVE   Bilirubin Urine NEGATIVE NEGATIVE   Ketones, ur 5 (A) NEGATIVE mg/dL   Protein, ur NEGATIVE NEGATIVE mg/dL   Nitrite NEGATIVE NEGATIVE  Leukocytes, UA TRACE (A) NEGATIVE   RBC / HPF 0-5 0 - 5 RBC/hpf   WBC, UA 0-5 0 - 5 WBC/hpf   Bacteria, UA MANY (A) NONE SEEN   Squamous Epithelial / LPF 0-5 (A) NONE SEEN  Influenza panel by PCR (type A & B)     Status: None   Collection Time: 01/30/16  4:34 PM  Result Value Ref Range   Influenza A By PCR NEGATIVE NEGATIVE   Influenza B By PCR NEGATIVE NEGATIVE    Comment: (NOTE) The Xpert Xpress Flu assay is intended as an aid in the diagnosis of  influenza and should not be used as a sole basis for treatment.  This  assay is FDA approved for nasopharyngeal swab specimens only. Nasal   washings and aspirates are unacceptable for Xpert Xpress Flu testing.   Comprehensive metabolic panel     Status: Abnormal   Collection Time: 01/30/16  5:20 PM  Result Value Ref Range   Sodium 135 135 - 145 mmol/L   Potassium 4.2 3.5 - 5.1 mmol/L   Chloride 99 (L) 101 - 111 mmol/L   CO2 24 22 - 32 mmol/L   Glucose, Bld 494 (H) 65 - 99 mg/dL   BUN 28 (H) 6 - 20 mg/dL   Creatinine, Ser 1.51 (H) 0.44 - 1.00 mg/dL   Calcium 8.4 (L) 8.9 - 10.3 mg/dL   Total Protein 5.8 (L) 6.5 - 8.1 g/dL   Albumin 3.2 (L) 3.5 - 5.0 g/dL   AST 409 (H) 15 - 41 U/L   ALT 194 (H) 14 - 54 U/L   Alkaline Phosphatase 128 (H) 38 - 126 U/L   Total Bilirubin 0.7 0.3 - 1.2 mg/dL   GFR calc non Af Amer 30 (L) >60 mL/min   GFR calc Af Amer 35 (L) >60 mL/min    Comment: (NOTE) The eGFR has been calculated using the CKD EPI equation. This calculation has not been validated in all clinical situations. eGFR's persistently <60 mL/min signify possible Chronic Kidney Disease.    Anion gap 12 5 - 15  Troponin I     Status: Abnormal   Collection Time: 01/30/16  5:20 PM  Result Value Ref Range   Troponin I 0.05 (HH) <0.03 ng/mL    Comment: CRITICAL RESULT CALLED TO, READ BACK BY AND VERIFIED WITH LAURA CATES ON 01/30/16 AT 1808 QSD   Lipase, blood     Status: None   Collection Time: 01/30/16  5:20 PM  Result Value Ref Range   Lipase 15 11 - 51 U/L  Blood Culture (routine x 2)     Status: None (Preliminary result)   Collection Time: 01/30/16  5:21 PM  Result Value Ref Range   Specimen Description BLOOD RIGHT ANTECUBITAL    Special Requests BOTTLES DRAWN AEROBIC AND ANAEROBIC [id number]    Culture  Setup Time      Organism ID to follow GRAM NEGATIVE RODS IN BOTH AEROBIC AND ANAEROBIC BOTTLES CRITICAL RESULT CALLED TO, READ BACK BY AND VERIFIED WITH: CHRISTINE KATSOUDAS ON 01/31/16 AT 1025 QSD    Culture GRAM NEGATIVE RODS    Report Status PENDING   Blood Culture ID Panel (Reflexed)     Status: Abnormal    Collection Time: 01/30/16  5:21 PM  Result Value Ref Range   Enterococcus species NOT DETECTED NOT DETECTED   Listeria monocytogenes NOT DETECTED NOT DETECTED   Staphylococcus species NOT DETECTED NOT DETECTED   Staphylococcus aureus NOT DETECTED NOT DETECTED  Streptococcus species NOT DETECTED NOT DETECTED   Streptococcus agalactiae NOT DETECTED NOT DETECTED   Streptococcus pneumoniae NOT DETECTED NOT DETECTED   Streptococcus pyogenes NOT DETECTED NOT DETECTED   Acinetobacter baumannii NOT DETECTED NOT DETECTED   Enterobacteriaceae species DETECTED (A) NOT DETECTED    Comment: CRITICAL RESULT CALLED TO, READ BACK BY AND VERIFIED WITH: [place] ON 01/31/16 AT 1025 QSD    Enterobacter cloacae complex NOT DETECTED NOT DETECTED   Escherichia coli NOT DETECTED NOT DETECTED   Klebsiella oxytoca NOT DETECTED NOT DETECTED   Klebsiella pneumoniae NOT DETECTED NOT DETECTED   Proteus species DETECTED (A) NOT DETECTED    Comment: CRITICAL RESULT CALLED TO, READ BACK BY AND VERIFIED WITH: CHRISTINE KATSOUDAS ON 01/31/16 AT 1025 QSD    Serratia marcescens NOT DETECTED NOT DETECTED   Carbapenem resistance NOT DETECTED NOT DETECTED   Haemophilus influenzae NOT DETECTED NOT DETECTED   Neisseria meningitidis NOT DETECTED NOT DETECTED   Pseudomonas aeruginosa NOT DETECTED NOT DETECTED   Candida albicans NOT DETECTED NOT DETECTED   Candida glabrata NOT DETECTED NOT DETECTED   Candida krusei NOT DETECTED NOT DETECTED   Candida parapsilosis NOT DETECTED NOT DETECTED   Candida tropicalis NOT DETECTED NOT DETECTED  Blood Culture (routine x 2)     Status: None (Preliminary result)   Collection Time: 01/30/16  5:25 PM  Result Value Ref Range   Specimen Description BLOOD LEFT HAND    Special Requests      BOTTLES DRAWN AEROBIC AND ANAEROBIC [place]   Culture  Setup Time      GRAM NEGATIVE RODS IN BOTH AEROBIC AND ANAEROBIC BOTTLES CRITICAL RESULT CALLED TO, READ BACK BY AND  VERIFIED WITH: CHRISTINE KATSOUDAS ON 01/31/16 AT 1025 QSD    Culture GRAM NEGATIVE RODS    Report Status PENDING   Lactic acid, plasma     Status: Abnormal   Collection Time: 01/30/16  5:25 PM  Result Value Ref Range   Lactic Acid, Venous 2.8 (HH) 0.5 - 1.9 mmol/L    Comment: CRITICAL RESULT CALLED TO, READ BACK BY AND VERIFIED WITH LAURA CATES ON 01/30/16 A 1824 [place]   Ethanol     Status: None   Collection Time: 01/30/16  7:18 PM  Result Value Ref Range   Alcohol, Ethyl (B) <5 <5 mg/dL    Comment:        LOWEST DETECTABLE LIMIT FOR SERUM ALCOHOL IS 5 mg/dL FOR MEDICAL PURPOSES ONLY   Acetaminophen level     Status: Abnormal   Collection Time: 01/30/16  7:18 PM  Result Value Ref Range   Acetaminophen (Tylenol), Serum <10 (L) 10 - 30 ug/mL    Comment:        THERAPEUTIC CONCENTRATIONS VARY SIGNIFICANTLY. A RANGE OF 10-30 ug/mL MAY BE AN EFFECTIVE CONCENTRATION FOR MANY PATIENTS. HOWEVER, SOME ARE BEST TREATED AT CONCENTRATIONS OUTSIDE THIS RANGE. ACETAMINOPHEN CONCENTRATIONS >150 ug/mL AT 4 HOURS AFTER INGESTION AND >50 ug/mL AT 12 HOURS AFTER INGESTION ARE OFTEN ASSOCIATED WITH TOXIC REACTIONS.   Lactic acid, plasma     Status: Abnormal   Collection Time: 01/30/16 10:55 PM  Result Value Ref Range   Lactic Acid, Venous 5.7 (HH) 0.5 - 1.9 mmol/L    Comment: CRITICAL RESULT CALLED TO, READ BACK BY AND VERIFIED WITH SILVIA FUENTES AT 0032 ON 01/31/16 RWW   Glucose, capillary     Status: Abnormal   Collection Time: 01/30/16 11:31 PM  Result Value Ref Range   Glucose-Capillary 529 (HH) 65 -  99 mg/dL  Lactic acid, plasma     Status: Abnormal   Collection Time: 01/30/16 11:35 PM  Result Value Ref Range   Lactic Acid, Venous 5.7 (HH) 0.5 - 1.9 mmol/L    Comment: CRITICAL RESULT CALLED TO, READ BACK BY AND VERIFIED WITH SILVIA FUENTES AT 0032 ON 01/31/16 RWW   Procalcitonin     Status: None   Collection Time: 01/30/16 11:35 PM  Result Value Ref Range   Procalcitonin  >200.00 ng/mL    Comment:        Interpretation: PCT >= 10 ng/mL: Important systemic inflammatory response, almost exclusively due to severe bacterial sepsis or septic shock. (NOTE)         ICU PCT Algorithm               Non ICU PCT Algorithm    ----------------------------     ------------------------------         PCT < 0.25 ng/mL                 PCT < 0.1 ng/mL     Stopping of antibiotics            Stopping of antibiotics       strongly encouraged.               strongly encouraged.    ----------------------------     ------------------------------       PCT level decrease by               PCT < 0.25 ng/mL       >= 80% from peak PCT       OR PCT 0.25 - 0.5 ng/mL          Stopping of antibiotics                                             encouraged.     Stopping of antibiotics           encouraged.    ----------------------------     ------------------------------       PCT level decrease by              PCT >= 0.25 ng/mL       < 80% from peak PCT        AND PCT >= 0.5 ng/mL             Continuing antibiotics                                              encouraged.       Continuing antibiotics            encouraged.    ----------------------------     ------------------------------     PCT level increase compared          PCT > 0.5 ng/mL         with peak PCT AND          PCT >= 0.5 ng/mL             Escalation of antibiotics  strongly encouraged.      Escalation of antibiotics        strongly encouraged.   Protime-INR     Status: None   Collection Time: 01/30/16 11:35 PM  Result Value Ref Range   Prothrombin Time 14.2 11.4 - 15.2 seconds   INR 1.10   APTT     Status: None   Collection Time: 01/30/16 11:35 PM  Result Value Ref Range   aPTT 33 24 - 36 seconds  Glucose, capillary     Status: Abnormal   Collection Time: 01/30/16 11:57 PM  Result Value Ref Range   Glucose-Capillary 452 (H) 65 - 99 mg/dL  CBC     Status:  Abnormal   Collection Time: 01/31/16  3:12 AM  Result Value Ref Range   WBC 14.3 (H) 3.6 - 11.0 K/uL   RBC 3.88 3.80 - 5.20 MIL/uL   Hemoglobin 11.4 (L) 12.0 - 16.0 g/dL   HCT 34.0 (L) 35.0 - 47.0 %   MCV 87.5 80.0 - 100.0 fL   MCH 29.4 26.0 - 34.0 pg   MCHC 33.5 32.0 - 36.0 g/dL   RDW 13.5 11.5 - 14.5 %   Platelets 126 (L) 150 - 440 K/uL  Comprehensive metabolic panel     Status: Abnormal   Collection Time: 01/31/16  3:12 AM  Result Value Ref Range   Sodium 137 135 - 145 mmol/L   Potassium 3.5 3.5 - 5.1 mmol/L   Chloride 104 101 - 111 mmol/L   CO2 21 (L) 22 - 32 mmol/L   Glucose, Bld 384 (H) 65 - 99 mg/dL   BUN 31 (H) 6 - 20 mg/dL   Creatinine, Ser 2.19 (H) 0.44 - 1.00 mg/dL   Calcium 8.1 (L) 8.9 - 10.3 mg/dL   Total Protein 5.7 (L) 6.5 - 8.1 g/dL   Albumin 2.9 (L) 3.5 - 5.0 g/dL   AST 170 (H) 15 - 41 U/L   ALT 140 (H) 14 - 54 U/L   Alkaline Phosphatase 129 (H) 38 - 126 U/L   Total Bilirubin 0.3 0.3 - 1.2 mg/dL   GFR calc non Af Amer 19 (L) >60 mL/min   GFR calc Af Amer 22 (L) >60 mL/min    Comment: (NOTE) The eGFR has been calculated using the CKD EPI equation. This calculation has not been validated in all clinical situations. eGFR's persistently <60 mL/min signify possible Chronic Kidney Disease.    Anion gap 12 5 - 15  Protime-INR     Status: None   Collection Time: 01/31/16  3:12 AM  Result Value Ref Range   Prothrombin Time 15.2 11.4 - 15.2 seconds   INR 1.19   APTT     Status: Abnormal   Collection Time: 01/31/16  3:12 AM  Result Value Ref Range   aPTT 44 (H) 24 - 36 seconds    Comment:        IF BASELINE aPTT IS ELEVATED, SUGGEST PATIENT RISK ASSESSMENT BE USED TO DETERMINE APPROPRIATE ANTICOAGULANT THERAPY.   Lactic acid, plasma     Status: Abnormal   Collection Time: 01/31/16  3:12 AM  Result Value Ref Range   Lactic Acid, Venous 6.6 (HH) 0.5 - 1.9 mmol/L    Comment: CRITICAL RESULT CALLED TO, READ BACK BY AND VERIFIED WITH SILVIA FUENTES AT 0417  ON 01/31/16 RWW   Glucose, capillary     Status: Abnormal   Collection Time: 01/31/16  5:40 AM  Result Value Ref Range   Glucose-Capillary 352 (  H) 65 - 99 mg/dL  MRSA PCR Screening     Status: None   Collection Time: 01/31/16  5:47 AM  Result Value Ref Range   MRSA by PCR NEGATIVE NEGATIVE    Comment:        The GeneXpert MRSA Assay (FDA approved for NASAL specimens only), is one component of a comprehensive MRSA colonization surveillance program. It is not intended to diagnose MRSA infection nor to guide or monitor treatment for MRSA infections.   Lactic acid, plasma     Status: Abnormal   Collection Time: 01/31/16  6:31 AM  Result Value Ref Range   Lactic Acid, Venous 3.5 (HH) 0.5 - 1.9 mmol/L    Comment: CRITICAL RESULT CALLED TO, READ BACK BY AND VERIFIED WITH AMBER COLLINS ON 01/31/16 AT 0709 MNS   C difficile quick scan w PCR reflex     Status: None   Collection Time: 01/31/16  7:33 AM  Result Value Ref Range   C Diff antigen NEGATIVE NEGATIVE   C Diff toxin NEGATIVE NEGATIVE   C Diff interpretation No C. difficile detected.   Glucose, capillary     Status: Abnormal   Collection Time: 01/31/16  7:54 AM  Result Value Ref Range   Glucose-Capillary 349 (H) 65 - 99 mg/dL  Lactic acid, plasma     Status: Abnormal   Collection Time: 01/31/16  9:27 AM  Result Value Ref Range   Lactic Acid, Venous 4.8 (HH) 0.5 - 1.9 mmol/L    Comment: CRITICAL RESULT CALLED TO, READ BACK BY AND VERIFIED WITH SARAH MOORE ON 01/31/16 AT 1005 MNS   Basic metabolic panel     Status: Abnormal   Collection Time: 01/31/16  9:27 AM  Result Value Ref Range   Sodium 139 135 - 145 mmol/L   Potassium 3.8 3.5 - 5.1 mmol/L   Chloride 110 101 - 111 mmol/L   CO2 19 (L) 22 - 32 mmol/L   Glucose, Bld 296 (H) 65 - 99 mg/dL   BUN 32 (H) 6 - 20 mg/dL   Creatinine, Ser 2.23 (H) 0.44 - 1.00 mg/dL   Calcium 7.3 (L) 8.9 - 10.3 mg/dL   GFR calc non Af Amer 19 (L) >60 mL/min   GFR calc Af Amer 22 (L) >60  mL/min    Comment: (NOTE) The eGFR has been calculated using the CKD EPI equation. This calculation has not been validated in all clinical situations. eGFR's persistently <60 mL/min signify possible Chronic Kidney Disease.    Anion gap 10 5 - 15  Magnesium     Status: Abnormal   Collection Time: 01/31/16  9:27 AM  Result Value Ref Range   Magnesium 1.4 (L) 1.7 - 2.4 mg/dL  Phosphorus     Status: None   Collection Time: 01/31/16  9:27 AM  Result Value Ref Range   Phosphorus 2.6 2.5 - 4.6 mg/dL  Glucose, capillary     Status: Abnormal   Collection Time: 01/31/16 11:15 AM  Result Value Ref Range   Glucose-Capillary 197 (H) 65 - 99 mg/dL  Glucose, capillary     Status: Abnormal   Collection Time: 01/31/16 12:22 PM  Result Value Ref Range   Glucose-Capillary 153 (H) 65 - 99 mg/dL  Glucose, capillary     Status: Abnormal   Collection Time: 01/31/16  1:26 PM  Result Value Ref Range   Glucose-Capillary 109 (H) 65 - 99 mg/dL  Glucose, capillary     Status: Abnormal   Collection Time:  01/31/16  2:29 PM  Result Value Ref Range   Glucose-Capillary 101 (H) 65 - 99 mg/dL  Basic metabolic panel     Status: Abnormal   Collection Time: 01/31/16  3:12 PM  Result Value Ref Range   Sodium 142 135 - 145 mmol/L   Potassium 3.4 (L) 3.5 - 5.1 mmol/L   Chloride 114 (H) 101 - 111 mmol/L   CO2 19 (L) 22 - 32 mmol/L   Glucose, Bld 128 (H) 65 - 99 mg/dL   BUN 33 (H) 6 - 20 mg/dL   Creatinine, Ser 2.42 (H) 0.44 - 1.00 mg/dL   Calcium 7.0 (L) 8.9 - 10.3 mg/dL   GFR calc non Af Amer 17 (L) >60 mL/min   GFR calc Af Amer 20 (L) >60 mL/min    Comment: (NOTE) The eGFR has been calculated using the CKD EPI equation. This calculation has not been validated in all clinical situations. eGFR's persistently <60 mL/min signify possible Chronic Kidney Disease.    Anion gap 9 5 - 15  Glucose, capillary     Status: Abnormal   Collection Time: 01/31/16  3:32 PM  Result Value Ref Range   Glucose-Capillary 131  (H) 65 - 99 mg/dL  Blood gas, arterial     Status: Abnormal   Collection Time: 01/31/16  4:15 PM  Result Value Ref Range   FIO2 0.32    Delivery systems NASAL CANNULA    pH, Arterial 7.28 (L) 7.350 - 7.450   pCO2 arterial 37 32.0 - 48.0 mmHg   pO2, Arterial 66 (L) 83.0 - 108.0 mmHg   Bicarbonate 17.4 (L) 20.0 - 28.0 mmol/L   Acid-base deficit 8.6 (H) 0.0 - 2.0 mmol/L   O2 Saturation 89.8 %   Patient temperature 37.0    Collection site RIGHT BRACHIAL    Sample type ARTERIAL DRAW    Allens test (pass/fail) PASS PASS  Glucose, capillary     Status: Abnormal   Collection Time: 01/31/16  4:32 PM  Result Value Ref Range   Glucose-Capillary 151 (H) 65 - 99 mg/dL  Glucose, capillary     Status: Abnormal   Collection Time: 01/31/16  5:32 PM  Result Value Ref Range   Glucose-Capillary 165 (H) 65 - 99 mg/dL  Glucose, capillary     Status: Abnormal   Collection Time: 01/31/16  6:56 PM  Result Value Ref Range   Glucose-Capillary 224 (H) 65 - 99 mg/dL  Glucose, capillary     Status: Abnormal   Collection Time: 01/31/16  8:06 PM  Result Value Ref Range   Glucose-Capillary 215 (H) 65 - 99 mg/dL  Basic metabolic panel     Status: Abnormal   Collection Time: 01/31/16  8:51 PM  Result Value Ref Range   Sodium 137 135 - 145 mmol/L   Potassium 3.8 3.5 - 5.1 mmol/L   Chloride 110 101 - 111 mmol/L   CO2 16 (L) 22 - 32 mmol/L   Glucose, Bld 271 (H) 65 - 99 mg/dL   BUN 35 (H) 6 - 20 mg/dL   Creatinine, Ser 2.56 (H) 0.44 - 1.00 mg/dL   Calcium 7.0 (L) 8.9 - 10.3 mg/dL   GFR calc non Af Amer 16 (L) >60 mL/min   GFR calc Af Amer 18 (L) >60 mL/min    Comment: (NOTE) The eGFR has been calculated using the CKD EPI equation. This calculation has not been validated in all clinical situations. eGFR's persistently <60 mL/min signify possible Chronic Kidney Disease.  Anion gap 11 5 - 15  Magnesium     Status: None   Collection Time: 01/31/16  8:51 PM  Result Value Ref Range   Magnesium 2.2 1.7 -  2.4 mg/dL  Glucose, capillary     Status: Abnormal   Collection Time: 01/31/16  8:56 PM  Result Value Ref Range   Glucose-Capillary 245 (H) 65 - 99 mg/dL   No components found for: ESR, C REACTIVE PROTEIN MICRO: Recent Results (from the past 720 hour(s))  Blood Culture (routine x 2)     Status: None (Preliminary result)   Collection Time: 01/30/16  5:21 PM  Result Value Ref Range Status   Specimen Description BLOOD RIGHT ANTECUBITAL  Final   Special Requests BOTTLES DRAWN AEROBIC AND ANAEROBIC [place]  Final   Culture  Setup Time   Final    Organism ID to follow GRAM NEGATIVE RODS IN BOTH AEROBIC AND ANAEROBIC BOTTLES CRITICAL RESULT CALLED TO, READ BACK BY AND VERIFIED WITH: CHRISTINE KATSOUDAS ON 01/31/16 AT 1025 QSD    Culture GRAM NEGATIVE RODS  Final   Report Status PENDING  Incomplete  Blood Culture ID Panel (Reflexed)     Status: Abnormal   Collection Time: 01/30/16  5:21 PM  Result Value Ref Range Status   Enterococcus species NOT DETECTED NOT DETECTED Final   Listeria monocytogenes NOT DETECTED NOT DETECTED Final   Staphylococcus species NOT DETECTED NOT DETECTED Final   Staphylococcus aureus NOT DETECTED NOT DETECTED Final   Streptococcus species NOT DETECTED NOT DETECTED Final   Streptococcus agalactiae NOT DETECTED NOT DETECTED Final   Streptococcus pneumoniae NOT DETECTED NOT DETECTED Final   Streptococcus pyogenes NOT DETECTED NOT DETECTED Final   Acinetobacter baumannii NOT DETECTED NOT DETECTED Final   Enterobacteriaceae species DETECTED (A) NOT DETECTED Final    Comment: CRITICAL RESULT CALLED TO, READ BACK BY AND VERIFIED WITH: [place] ON 01/31/16 AT 1025 QSD    Enterobacter cloacae complex NOT DETECTED NOT DETECTED Final   Escherichia coli NOT DETECTED NOT DETECTED Final   Klebsiella oxytoca NOT DETECTED NOT DETECTED Final   Klebsiella pneumoniae NOT DETECTED NOT DETECTED Final   Proteus species DETECTED (A) NOT DETECTED Final     Comment: CRITICAL RESULT CALLED TO, READ BACK BY AND VERIFIED WITH: CHRISTINE KATSOUDAS ON 01/31/16 AT 1025 QSD    Serratia marcescens NOT DETECTED NOT DETECTED Final   Carbapenem resistance NOT DETECTED NOT DETECTED Final   Haemophilus influenzae NOT DETECTED NOT DETECTED Final   Neisseria meningitidis NOT DETECTED NOT DETECTED Final   Pseudomonas aeruginosa NOT DETECTED NOT DETECTED Final   Candida albicans NOT DETECTED NOT DETECTED Final   Candida glabrata NOT DETECTED NOT DETECTED Final   Candida krusei NOT DETECTED NOT DETECTED Final   Candida parapsilosis NOT DETECTED NOT DETECTED Final   Candida tropicalis NOT DETECTED NOT DETECTED Final  Blood Culture (routine x 2)     Status: None (Preliminary result)   Collection Time: 01/30/16  5:25 PM  Result Value Ref Range Status   Specimen Description BLOOD LEFT HAND  Final   Special Requests   Final    BOTTLES DRAWN AEROBIC AND ANAEROBIC [id number],[id number]   Culture  Setup Time   Final    GRAM NEGATIVE RODS IN BOTH AEROBIC AND ANAEROBIC BOTTLES CRITICAL RESULT CALLED TO, READ BACK BY AND VERIFIED WITH: CHRISTINE KATSOUDAS ON 01/31/16 AT 1025 QSD    Culture GRAM NEGATIVE RODS  Final   Report Status PENDING  Incomplete  MRSA PCR Screening  Status: None   Collection Time: 01/31/16  5:47 AM  Result Value Ref Range Status   MRSA by PCR NEGATIVE NEGATIVE Final    Comment:        The GeneXpert MRSA Assay (FDA approved for NASAL specimens only), is one component of a comprehensive MRSA colonization surveillance program. It is not intended to diagnose MRSA infection nor to guide or monitor treatment for MRSA infections.   C difficile quick scan w PCR reflex     Status: None   Collection Time: 01/31/16  7:33 AM  Result Value Ref Range Status   C Diff antigen NEGATIVE NEGATIVE Final   C Diff toxin NEGATIVE NEGATIVE Final   C Diff interpretation No C. difficile detected.  Final    IMAGING: Dg Chest 1 View  Result Date:  01/30/2016 CLINICAL DATA:  Altered mental status.  Feeling poorly EXAM: CHEST 1 VIEW COMPARISON:  None. FINDINGS: Normal mediastinum and cardiac silhouette. Normal pulmonary vasculature. No evidence of effusion, infiltrate, or pneumothorax. No acute bony abnormality. IMPRESSION: No acute cardiopulmonary process. Electronically Signed   By: Suzy Bouchard M.D.   On: 01/30/2016 16:17    Assessment:   AANCHAL COPE is a 83 y.o. female with proteus bacteremia of unknown source - UA unimpressive, CXR neg. No abd imaging done.  Quite ill at this point. Discussed with granddaughter who is a CNA.  Recommendations Continue zosyn pending results of sensitivity testing May need abd imaging if remains septic despite fluid and vasopressor support but would not add much at this point.   Thank you very much for allowing me to participate in the care of this patient. Please call with questions.   Cheral Marker. Ola Spurr, MD

## 2016-01-31 NOTE — Progress Notes (Signed)
After further assessment, patient with severe septic shock and poor medical condition, the daughter and grand daughter have decided to make patient DNR/DNI status.    Corrin Parker, M.D.  [facility]  Medical Director [place] Director [facility] Cardio-Pulmonary Department

## 2016-01-31 NOTE — Progress Notes (Signed)
Inpatient Diabetes Program Recommendations  AACE/ADA: New Consensus Statement on Inpatient Glycemic Control (2015)  Target Ranges:  Prepandial:   less than 140 mg/dL      Peak postprandial:   less than 180 mg/dL (1-2 hours)      Critically ill patients:  140 - 180 mg/dL   Results for LISETT, LEGROW (MRN [id number]) as of 01/31/2016 07:35  Ref. Range 01/30/2016 23:31 01/30/2016 23:57 01/31/2016 05:40  Glucose-Capillary Latest Ref Range: 65 - 99 mg/dL 529 (HH) 452 (H) 352 (H)    Admit with: AMS/ Sepsis  History: DM  Home DM Meds: Metformin 1000 mg BID       Glipizide 10 mg daily  Current Insulin Orders: Novolog 2-4-6 Q4 hours Per ICU Glycemic Control Protocol       MD- Please transition patient to Phase 2 IV Insulin drip (per ICU Glycemic Control Protocol guidelines)  Patient with CBG 352 mg/dl this AM     --Will follow patient during hospitalization--  Wyn Quaker RN, MSN, CDE Diabetes Coordinator Inpatient Glycemic Control Team Team Pager: [phone] (8a-5p)

## 2016-01-31 NOTE — Evaluation (Signed)
Physical Therapy Evaluation Patient Details Name: Sherry [name] MRN: [id number] DOB: 09-07-1927 Today's Date: 01/31/2016   History of Present Illness  Pt is a 83 y/o F who was found by daughter to have AMS sitting in her own diarrhea.  Her blood sugar was greater than 400.  A code sepsis was called, pt febrile, tachycardic, tachypneic, and temperature 102.5.  Once on the floor pt became hypotensive and lethargic.  Pt's PMH includes back surgery, shoulder surgery.    Clinical Impression  Pt admitted with above diagnosis. Pt currently with functional limitations due to the deficits listed below (see PT Problem List). Sherry [name] presents very lethargic and keeps her eyes closed the majority of the session.  Pt at times provides assist with therapeutic exercise and at other times, resistance. BP 123/54, HR up to 120, SpO2 down to 86% on RA, and RR up to 30 during physical therapy evaluation.  RN present during evaluation and aware of vitals. Pt's daughter provided information on PLOF reporting that pt was ambulating with rollator and had one fall in the past 6 months.  She was living with her son, whom the daughter says was not providing appropriate supervision for the pt at home.  Pt will benefit from skilled PT to increase their independence and safety with mobility to allow discharge to the venue listed below.      Follow Up Recommendations SNF    Equipment Recommendations  Other (comment) (TBD at next venue of care)    Recommendations for Other Services OT consult     Precautions / Restrictions Precautions Precautions: Fall;Other (comment) Precaution Comments: monitor vitals Restrictions Weight Bearing Restrictions: No      Mobility  Bed Mobility Overal bed mobility: Needs Assistance Bed Mobility: Rolling Rolling: Mod assist;+2 for physical assistance         General bed mobility comments: +2 physical assist to roll side to side for repositioning of bed pad.  Pt does not provide  assist.  Transfers                 General transfer comment: Uanble to attempt at this time  Ambulation/Gait                Stairs            Wheelchair Mobility    Modified Rankin (Stroke Patients Only)       Balance                                             Pertinent Vitals/Pain Pain Assessment:  (no grimacing or sign of pt in pain)    Home Living Family/patient expects to be discharged to:: Skilled nursing facility Living Arrangements: Children               Additional Comments: Pt has been living with her son. Per daughter the son is at home at all times (not working) but does not provide appropriate supervision to the pt.  Daughter is very adamant about pt staying with her and her husband once pt ready to return home.  Daughter reports between her and her husband they will be able to provide 24/7 assist/supervision.    Prior Function Level of Independence: Independent with assistive device(s)         Comments: Daughter reports that she gave pt a rollator recently and the pt uses it  when the daughter is present but the daughter questions whether or not she is using it when she is not there.  Daughter reports the pt has fallen x1 over the past 6 months, in the shower.  PTA the pt was bathing, dressing, cooking, independently.  Pt no longer driving.     Hand Dominance        Extremity/Trunk Assessment   Upper Extremity Assessment Upper Extremity Assessment: RUE deficits/detail;LUE deficits/detail RUE Deficits / Details: Unable to perform formal assessment due to lethargy but pt provides some assist with therapeutic exercise while at other time resists movement.  Strength grossly 4/5 when pt resisting. LUE Deficits / Details: Unable to perform formal assessment due to lethargy but pt provides some assist with therapeutic exercise while at other time resists movement.  Strength grossly 4/5 when pt resisting.    Lower  Extremity Assessment Lower Extremity Assessment: RLE deficits/detail;LLE deficits/detail RLE Deficits / Details: Unable to perform formal assessment due to lethargy but pt provides some assist with therapeutic exercise while at other time resists movement.  Strength grossly 4/5 when pt resisting. LLE Deficits / Details: Unable to perform formal assessment due to lethargy but pt provides some assist with therapeutic exercise while at other time resists movement.  Strength grossly 4/5 when pt resisting.    Cervical / Trunk Assessment Cervical / Trunk Assessment: Kyphotic  Communication   Communication: Other (comment) (pt nonverbal throughout session)  Cognition Arousal/Alertness: Lethargic Behavior During Therapy: Flat affect Overall Cognitive Status: Difficult to assess                 General Comments: Eyes closed throughout majority of session.  Does open her eyes at times with therapeutic exercise.    General Comments General comments (skin integrity, edema, etc.): BP 123/54, HR up to 120, SpO2 down to 86% on RA, and RR up to 30 during physical therapy evaluation.  RN present during evaluation and aware of vitals.  Pt's daughter present during evaluation.    Exercises General Exercises - Upper Extremity Shoulder Flexion: AAROM;Both;10 reps;Supine;PROM Elbow Flexion: PROM;AAROM;Both;10 reps;Supine Elbow Extension: PROM;AAROM;Both;10 reps;Supine General Exercises - Lower Extremity Ankle Circles/Pumps: PROM;Both;10 reps;Supine Heel Slides: AAROM;PROM;Both;10 reps;Supine Hip ABduction/ADduction: PROM;AAROM;Both;10 reps;Supine Straight Leg Raises: PROM;AAROM;Both;10 reps;Supine   Assessment/Plan    PT Assessment Patient needs continued PT services  PT Problem List Decreased strength;Decreased activity tolerance;Decreased balance;Decreased cognition;Decreased knowledge of use of DME;Decreased safety awareness;Cardiopulmonary status limiting activity          PT Treatment  Interventions DME instruction;Gait training;Functional mobility training;Therapeutic activities;Stair training;Therapeutic exercise;Balance training;Neuromuscular re-education;Patient/family education;Cognitive remediation    PT Goals (Current goals can be found in the Care Plan section)  Acute Rehab PT Goals Patient Stated Goal: per daughter, to get stronger and go home PT Goal Formulation: With family Time For Goal Achievement: 02/14/16 Potential to Achieve Goals: Fair    Frequency Min 2X/week   Barriers to discharge        Co-evaluation               End of Session   Activity Tolerance: Patient limited by lethargy;Patient limited by fatigue Patient left: in bed;with call bell/phone within reach;with bed alarm set;with family/visitor present;with nursing/sitter in room Nurse Communication: Mobility status;Other (comment) (RN present during evaluation)         Time: [id number] PT Time Calculation (min) (ACUTE ONLY): 15 min   Charges:   PT Evaluation $PT Eval Moderate Complexity: 1 Procedure     PT G Codes:  Collie Siad PT, DPT 01/31/2016, 11:46 AM

## 2016-01-31 NOTE — Progress Notes (Signed)
PHARMACY - PHYSICIAN COMMUNICATION CRITICAL VALUE ALERT - BLOOD CULTURE IDENTIFICATION (BCID)  Results for orders placed or performed during the hospital encounter of 01/30/16  Blood Culture ID Panel (Reflexed) (Collected: 01/30/2016  5:21 PM)  Result Value Ref Range   Enterococcus species NOT DETECTED NOT DETECTED   Listeria monocytogenes NOT DETECTED NOT DETECTED   Staphylococcus species NOT DETECTED NOT DETECTED   Staphylococcus aureus NOT DETECTED NOT DETECTED   Streptococcus species NOT DETECTED NOT DETECTED   Streptococcus agalactiae NOT DETECTED NOT DETECTED   Streptococcus pneumoniae NOT DETECTED NOT DETECTED   Streptococcus pyogenes NOT DETECTED NOT DETECTED   Acinetobacter baumannii NOT DETECTED NOT DETECTED   Enterobacteriaceae species DETECTED (A) NOT DETECTED   Enterobacter cloacae complex NOT DETECTED NOT DETECTED   Escherichia coli NOT DETECTED NOT DETECTED   Klebsiella oxytoca NOT DETECTED NOT DETECTED   Klebsiella pneumoniae NOT DETECTED NOT DETECTED   Proteus species DETECTED (A) NOT DETECTED   Serratia marcescens NOT DETECTED NOT DETECTED   Carbapenem resistance NOT DETECTED NOT DETECTED   Haemophilus influenzae NOT DETECTED NOT DETECTED   Neisseria meningitidis NOT DETECTED NOT DETECTED   Pseudomonas aeruginosa NOT DETECTED NOT DETECTED   Candida albicans NOT DETECTED NOT DETECTED   Candida glabrata NOT DETECTED NOT DETECTED   Candida krusei NOT DETECTED NOT DETECTED   Candida parapsilosis NOT DETECTED NOT DETECTED   Candida tropicalis NOT DETECTED NOT DETECTED    Name of physician (or Provider) Contacted: Dr. Mortimer Fries  Changes to prescribed antibiotics required: Per discussion on rounds Dr. Mortimer Fries would like to D/C vancomycin and Zosyn. Patient is started on Ceftriaxone 2g IV Q24h. Will follow-up sensitives once available.   Loree Fee, PharmD 01/31/2016  11:51 AM

## 2016-01-31 NOTE — Progress Notes (Signed)
Rapid response was activated for pt, lactic acid was 6.6 and B/P 82/35. MD was notified, new orders to transfer pt to step down and 1 liter bolus, continue to monitor

## 2016-01-31 NOTE — Progress Notes (Signed)
eLink Physician-Brief Progress Note Patient Name: Sherry [name] DOB: 1927/08/10 MRN: [id number]   Date of Service  01/31/2016  HPI/Events of Note  Hypotensive with rising lactate BP responding to fluid bolus AKI Mental status -improved  eICU Interventions  Insert foley More fluids vs add pressors for MAP < 65 Empiric broad spectrum abx     Intervention Category Major Interventions: Sepsis - evaluation and management  Nasir Bright V. 01/31/2016, 5:59 AM

## 2016-01-31 NOTE — Progress Notes (Signed)
Lab called with a critical lactic acid of 4.8. Dr. Mortimer Fries notified of same. No new orders at this time.

## 2016-01-31 NOTE — Progress Notes (Signed)
Pt's daughter in room stated pt is c/o back pain. Pt had previously denied pain prior to family's arrival. When pt asked if she is having pain at this time she responds "yes, my back". PRN medication given at this time.

## 2016-01-31 NOTE — Progress Notes (Signed)
Pt's daughter, who is the decision maker for pt, has refused a central line at this time. Daughter was explained to the reason for cental line and the medications pt is receiving. Daughter states that she may re-evaluate her decision, but at this time refuses.

## 2016-01-31 NOTE — Consult Note (Signed)
PULMONARY / CRITICAL CARE MEDICINE   Name: Sherry [name] MRN: [id number] DOB: 11/12/1927    ADMISSION DATE:  01/30/2016   CONSULTATION DATE:  01/31/2016  REFERRING MD:  Dr. Orbie Pyo  CHIEF COMPLAINT:  Hypotension and AMS  HISTORY OF PRESENT ILLNESS:  Sherry [name] is an 83 year old Caucasian female with a past medical history of type 2 diabetes mellitus, hypertension and hyperlipidemia who presented to the ED via EMS with complaints of altered mental status and weakness. Her daughter called EMS because she found the patient in a recliner [place] in feces. Patient states that she hasn't been feeling well for the past two days. She reports one loose stool and one episode of nausea and vomiting. She is unable to characterize the nature of the emesis. Her blood sugar on the field was greater than 400 mg/dl. At the ED, patient was febrile with a temperature of 102.5 degrees F, tachycardic and tachypneic. A code sepsis was called.  She was admitted to the Hospitalist service and started on IV fluids and empiric antibiotics. Upon arrival on the floor, patient became more lethargic, and hypotensive. Her lactic acid level increased from 5.7 to 6.6 despite fluid resuscitation. PCCM was consulted to assume patient's management. She denies pain and difficulty breathing but reports feeling tired.    PAST MEDICAL HISTORY :  She  has a past medical history of Diabetes mellitus without complication ([place]) and Hypertension.  PAST SURGICAL HISTORY: She  has a past surgical history that includes Shoulder surgery and Back surgery.  No Known Allergies  No current facility-administered medications on file prior to encounter.    Current Outpatient Prescriptions on File Prior to Encounter  Medication Sig  . atorvastatin (LIPITOR) 20 MG tablet Take 1 tablet by mouth at bedtime.  . diclofenac sodium (VOLTAREN) 1 % GEL Apply 2 g topically 4 (four) times daily.  Marland Kitchen gabapentin (NEURONTIN) 300 MG capsule Take 1 capsule by  mouth at bedtime.  Marland Kitchen glipiZIDE (GLUCOTROL XL) 10 MG 24 hr tablet Take 1 tablet by mouth daily.  Marland Kitchen lisinopril-hydrochlorothiazide (PRINZIDE,ZESTORETIC) 10-12.5 MG per tablet Take 1 tablet by mouth daily.  . metFORMIN (GLUCOPHAGE) 1000 MG tablet Take 1,000 mg by mouth 2 (two) times daily.  Marland Kitchen oxyCODONE-acetaminophen (PERCOCET/ROXICET) 5-325 MG tablet Take 1 tablet by mouth every 4 (four) hours as needed for moderate pain or severe pain.  Sherry [name] Bandages & Supports (T.E.D. KNEE LENGTH/M-LONG) MISC 2 application by Does not apply route every morning.  . naproxen sodium (ANAPROX) 275 MG tablet Take 1 tablet (275 mg total) by mouth 2 (two) times daily with a meal. (Patient not taking: Reported on 01/30/2016)    FAMILY HISTORY:  Her has no family status information on file.    SOCIAL HISTORY: She  reports that she has never smoked. She has never used smokeless tobacco. She reports that she does not drink alcohol or use drugs.  REVIEW OF SYSTEMS:   Limited due to patient's health status Constitutional: Negative for fever and chills but positive for generalized malaise.  HENT: Negative for congestion and rhinorrhea.  Eyes: Negative for redness and visual disturbance.  Respiratory: Negative for shortness of breath and wheezing.  Cardiovascular: Negative for chest pain and palpitations.  Gastrointestinal: Reports diarrhea, nausea and vomiting that has mildly subsided. Genitourinary: Negative for dysuria and urgency.  Endocrine: Negative for polyuria, and polyphagia Musculoskeletal: Negative for myalgias and arthralgias.  Skin: Negative for pallor and wound.  Neurological: Negative for dizziness and headaches   SUBJECTIVE:  VITAL SIGNS: BP (!) 90/39   Pulse 89   Temp 99.4 F (37.4 C) (Axillary)   Resp (!) 24   Ht 5\' 3"  (1.6 m)   Wt 77.1 kg (170 lb)   SpO2 95%   BMI 30.11 kg/m   HEMODYNAMICS:    VENTILATOR SETTINGS:    INTAKE / OUTPUT: I/O last 3 completed shifts: In: 1000  [IV Piggyback:1000] Out: -   PHYSICAL EXAMINATION: General: Acutely ill-looking Neuro: AAO X2, speech is normal,follows commands and moves all extremities HEENT: /AT, PERRLA, Oral mucosa dry, trachea midline Cardiovascular: RRR, S1/S2, no MRG, +2 pulses bilaterally, no edema Lungs: Normal WOB, CTAB, diminished in the bases Abdomen:  Non-distended, normal bowel sounds Musculoskeletal:  No joint swelling, +rom in UE/LE Skin: Cold, no rash or lesions  LABS:  BMET  Recent Labs Lab 01/30/16 1720 01/31/16 0312  NA 135 137  K 4.2 3.5  CL 99* 104  CO2 24 21*  BUN 28* 31*  CREATININE 1.51* 2.19*  GLUCOSE 494* 384*    Electrolytes  Recent Labs Lab 01/30/16 1720 01/31/16 0312  CALCIUM 8.4* 8.1*    CBC  Recent Labs Lab 01/31/16 0312  WBC 14.3*  HGB 11.4*  HCT 34.0*  PLT 126*    Coag's  Recent Labs Lab 01/30/16 2335 01/31/16 0312  APTT 33 44*  INR 1.10 1.19    Sepsis Markers  Recent Labs Lab 01/30/16 2255 01/30/16 2335 01/31/16 0312  LATICACIDVEN 5.7* 5.7* 6.6*  PROCALCITON  --  >200.00  --     ABG No results for input(s): PHART, PCO2ART, PO2ART in the last 168 hours.  Liver Enzymes  Recent Labs Lab 01/30/16 1720 01/31/16 0312  AST 409* 170*  ALT 194* 140*  ALKPHOS 128* 129*  BILITOT 0.7 0.3  ALBUMIN 3.2* 2.9*    Cardiac Enzymes  Recent Labs Lab 01/30/16 1720  TROPONINI 0.05*    Glucose  Recent Labs Lab 01/30/16 2331 01/30/16 2357 01/31/16 0540  GLUCAP 529* 452* 352*    Imaging Dg Chest 1 View  Result Date: 01/30/2016 CLINICAL DATA:  Altered mental status.  Feeling poorly EXAM: CHEST 1 VIEW COMPARISON:  None. FINDINGS: Normal mediastinum and cardiac silhouette. Normal pulmonary vasculature. No evidence of effusion, infiltrate, or pneumothorax. No acute bony abnormality. IMPRESSION: No acute cardiopulmonary process. Electronically Signed   By: Suzy Bouchard M.D.   On: 01/30/2016 16:17    STUDIES:   None  CULTURES: Blood cultures x 2 01/22> Urine culture 01/22>  ANTIBIOTICS: Vancomycin 01/21> Zosyn 01/21 Flagyl 01/22>  SIGNIFICANT EVENTS: 01/21>ED with sepsis of unknown source  LINES/TUBES: PIVs  DISCUSSION: 83 y/o WF presenting with septic shock, sepsis of unknown origin, severe hyperglycemia and gastroenteritis  ASSESSMENT / PLAN:  PULMONARY A: Lactic and metabolic acidosis Hypoxia secondary to sepsis P:   Supplemental O2 prn  CARDIOVASCULAR A:  Septic shock with superimpose hypovolemic shock 2/2 diarrhea/vomiting H/o hypertension H/o Hyperlipidemia P:  IV fluid resuscitation Norepinephrine infusion; titrate to keep MAP>65 Patient may need a Central venous catheter if shock is refractory to IV fluids Solucortef 250MG  iv x1 Hemodynamic monitoring per ICU protocol 2-D echo Hold all antihypertensives   RENAL A:   Acute on chronic renal failure-baseline creatinine 1.1; now 2.2. Hypomagnesema P:   Trend creatinine IV fluids Monitor and replace electrolytes Will consider nephology consult if kidney function continues to decline  GASTROINTESTINAL A:   Acute gastroenteritis-Viral versus bacteria P:   Clear liquids as tolerated Stool for C-DIff  INFECTIOUS A:  Fever and leukocytosis P:   Empiric antibiotics F/U Cultures  ENDOCRINE A:   Severe hyperglycemia   P:   ICU glycemic control protocol Diabetes education consult BMP Q6h X 3 coourences  NEUROLOGIC A:   Acute metabolic encephalopathy 2/2 sepsis and septic shock P:   RASS goal: n/a Monitor neurologic status Treat for for sepsis/septic shock as above   FAMILY  - Updates: No family at bedside. Will update when available.   - Inter-disciplinary family meet or Palliative Care meeting due by:  day 7   Magdalene S. [facility] ANP-BC Pulmonary and Critical Care Medicine [facility] Pager [phone] or [phone] 01/31/2016, 6:54 AM

## 2016-01-31 NOTE — Progress Notes (Addendum)
[place] for electrolytes Indication: insulin drip  No Known Allergies  Patient Measurements: Height: 5\' 3"  (160 cm) Weight: 170 lb (77.1 kg) IBW/kg (Calculated) : 52.4  Vital Signs: Temp: 98.8 F (37.1 C) (01/22 0800) Temp Source: Axillary (01/22 0800) BP: 147/111 (01/22 1100) Pulse Rate: 119 (01/22 1104) Intake/Output from previous day: 01/21 0701 - 01/22 0700 In: [place] [IV Piggyback:1850] Out: -  Intake/Output from this shift: Total I/O In: 1707.1 [I.V.:707.1; IV Piggyback:1000] Out: -   Labs:  Recent Labs  01/30/16 1720 01/30/16 2335 01/31/16 0312 01/31/16 0927  WBC  --   --  14.3*  --   HGB  --   --  11.4*  --   HCT  --   --  34.0*  --   PLT  --   --  126*  --   APTT  --  33 44*  --   CREATININE 1.51*  --  2.19* 2.23*  MG  --   --   --  1.4*  PHOS  --   --   --  2.6  ALBUMIN 3.2*  --  2.9*  --   PROT 5.8*  --  5.7*  --   AST 409*  --  170*  --   ALT 194*  --  140*  --   ALKPHOS 128*  --  129*  --   BILITOT 0.7  --  0.3  --    Estimated Creatinine Clearance: 17.8 mL/min (by C-G formula based on SCr of 2.23 mg/dL (H)).   Microbiology: Recent Results (from the past 720 hour(s))  Blood Culture (routine x 2)     Status: None (Preliminary result)   Collection Time: 01/30/16  5:21 PM  Result Value Ref Range Status   Specimen Description BLOOD RIGHT ANTECUBITAL  Final   Special Requests BOTTLES DRAWN AEROBIC AND ANAEROBIC [place]  Final   Culture  Setup Time   Final    Organism ID to follow GRAM NEGATIVE RODS ANAEROBIC BOTTLE ONLY CRITICAL RESULT CALLED TO, READ BACK BY AND VERIFIED WITH: CHRISTINE KATSOUDAS ON 01/31/16 AT 1025 QSD    Culture GRAM NEGATIVE RODS  Final   Report Status PENDING  Incomplete  Blood Culture ID Panel (Reflexed)     Status: Abnormal   Collection Time: 01/30/16  5:21 PM  Result Value Ref Range Status   Enterococcus species NOT DETECTED NOT DETECTED Final   Listeria  monocytogenes NOT DETECTED NOT DETECTED Final   Staphylococcus species NOT DETECTED NOT DETECTED Final   Staphylococcus aureus NOT DETECTED NOT DETECTED Final   Streptococcus species NOT DETECTED NOT DETECTED Final   Streptococcus agalactiae NOT DETECTED NOT DETECTED Final   Streptococcus pneumoniae NOT DETECTED NOT DETECTED Final   Streptococcus pyogenes NOT DETECTED NOT DETECTED Final   Acinetobacter baumannii NOT DETECTED NOT DETECTED Final   Enterobacteriaceae species DETECTED (A) NOT DETECTED Final    Comment: CRITICAL RESULT CALLED TO, READ BACK BY AND VERIFIED WITH: CHRISTINE KATSOUDAS ON 01/31/16 AT 1025 QSD    Enterobacter cloacae complex NOT DETECTED NOT DETECTED Final   Escherichia coli NOT DETECTED NOT DETECTED Final   Klebsiella oxytoca NOT DETECTED NOT DETECTED Final   Klebsiella pneumoniae NOT DETECTED NOT DETECTED Final   Proteus species DETECTED (A) NOT DETECTED Final    Comment: CRITICAL RESULT CALLED TO, READ BACK BY AND VERIFIED WITH: CHRISTINE KATSOUDAS ON 01/31/16 AT 1025 QSD    Serratia marcescens NOT DETECTED NOT DETECTED Final  Carbapenem resistance NOT DETECTED NOT DETECTED Final   Haemophilus influenzae NOT DETECTED NOT DETECTED Final   Neisseria meningitidis NOT DETECTED NOT DETECTED Final   Pseudomonas aeruginosa NOT DETECTED NOT DETECTED Final   Candida albicans NOT DETECTED NOT DETECTED Final   Candida glabrata NOT DETECTED NOT DETECTED Final   Candida krusei NOT DETECTED NOT DETECTED Final   Candida parapsilosis NOT DETECTED NOT DETECTED Final   Candida tropicalis NOT DETECTED NOT DETECTED Final  Blood Culture (routine x 2)     Status: None (Preliminary result)   Collection Time: 01/30/16  5:25 PM  Result Value Ref Range Status   Specimen Description BLOOD LEFT HAND  Final   Special Requests   Final    BOTTLES DRAWN AEROBIC AND ANAEROBIC 10CCAERO,10CCANA   Culture  Setup Time   Final    GRAM NEGATIVE RODS ANAEROBIC BOTTLE ONLY CRITICAL RESULT  CALLED TO, READ BACK BY AND VERIFIED WITH: CHRISTINE KATSOUDAS ON 01/31/16 AT 1025 QSD    Culture GRAM NEGATIVE RODS  Final   Report Status PENDING  Incomplete  MRSA PCR Screening     Status: None   Collection Time: 01/31/16  5:47 AM  Result Value Ref Range Status   MRSA by PCR NEGATIVE NEGATIVE Final    Comment:        The GeneXpert MRSA Assay (FDA approved for NASAL specimens only), is one component of a comprehensive MRSA colonization surveillance program. It is not intended to diagnose MRSA infection nor to guide or monitor treatment for MRSA infections.   C difficile quick scan w PCR reflex     Status: None   Collection Time: 01/31/16  7:33 AM  Result Value Ref Range Status   C Diff antigen NEGATIVE NEGATIVE Final   C Diff toxin NEGATIVE NEGATIVE Final   C Diff interpretation No C. difficile detected.  Final    Medical History: Past Medical History:  Diagnosis Date  . Diabetes mellitus without complication ([place])   . Hyperlipidemia   . Hypertension      Assessment: 83yo admitted 1/21 with AMS/diarrhea. Blood sugars have been high since admission around 400s. Will start patient on insulin drip.   Goal of Therapy:  K 3.5-5  Plan:  Will give potassium chloride 42mEq IV x1. F/U electrolytes @ 1500.   Loree Fee, PharmD 01/31/2016,11:58 AM

## 2016-01-31 NOTE — Progress Notes (Signed)
Pharmacist - Prescriber Communication  Zosyn 3.375 gm IV Q8H EI has been modified to Q12H EI for creatinine clearance < 20 mL/min.  Sherry [name] A. [place], [place].D., BCPS Clinical Pharmacist 01/31/2016 0430

## 2016-02-01 DIAGNOSIS — G9340 Encephalopathy, unspecified: Secondary | ICD-10-CM

## 2016-02-01 DIAGNOSIS — E872 Acidosis: Secondary | ICD-10-CM

## 2016-02-01 LAB — BASIC METABOLIC PANEL
ANION GAP: 11 (ref 5–15)
ANION GAP: 11 (ref 5–15)
BUN: 35 mg/dL — ABNORMAL HIGH (ref 6–20)
BUN: 35 mg/dL — AB (ref 6–20)
CALCIUM: 6.8 mg/dL — AB (ref 8.9–10.3)
CO2: 15 mmol/L — ABNORMAL LOW (ref 22–32)
CO2: 18 mmol/L — AB (ref 22–32)
Calcium: 6.4 mg/dL — CL (ref 8.9–10.3)
Chloride: 112 mmol/L — ABNORMAL HIGH (ref 101–111)
Chloride: 108 mmol/L (ref 101–111)
Creatinine, Ser: 2.71 mg/dL — ABNORMAL HIGH (ref 0.44–1.00)
Creatinine, Ser: 2.85 mg/dL — ABNORMAL HIGH (ref 0.44–1.00)
GFR calc Af Amer: 17 mL/min — ABNORMAL LOW (ref >60)
GFR calc Af Amer: 16 mL/min — ABNORMAL LOW (ref >60)
GFR calc non Af Amer: 14 mL/min — ABNORMAL LOW (ref >60)
GFR, EST NON AFRICAN AMERICAN: 15 mL/min — AB (ref >60)
GLUCOSE: 226 mg/dL — AB (ref 65–99)
GLUCOSE: 145 mg/dL — AB (ref 65–99)
POTASSIUM: 4.2 mmol/L (ref 3.5–5.1)
POTASSIUM: 4.1 mmol/L (ref 3.5–5.1)
Sodium: 138 mmol/L (ref 135–145)
Sodium: 137 mmol/L (ref 135–145)

## 2016-02-01 LAB — GLUCOSE, CAPILLARY
GLUCOSE-CAPILLARY: 118 mg/dL — AB (ref 65–99)
GLUCOSE-CAPILLARY: 130 mg/dL — AB (ref 65–99)
GLUCOSE-CAPILLARY: 166 mg/dL — AB (ref 65–99)
GLUCOSE-CAPILLARY: 187 mg/dL — AB (ref 65–99)
GLUCOSE-CAPILLARY: 254 mg/dL — AB (ref 65–99)
Glucose-Capillary: 238 mg/dL — ABNORMAL HIGH (ref 65–99)
Glucose-Capillary: 232 mg/dL — ABNORMAL HIGH (ref 65–99)
Glucose-Capillary: 138 mg/dL — ABNORMAL HIGH (ref 65–99)
Glucose-Capillary: 141 mg/dL — ABNORMAL HIGH (ref 65–99)
Glucose-Capillary: 128 mg/dL — ABNORMAL HIGH (ref 65–99)
Glucose-Capillary: 125 mg/dL — ABNORMAL HIGH (ref 65–99)
Glucose-Capillary: 141 mg/dL — ABNORMAL HIGH (ref 65–99)
Glucose-Capillary: 144 mg/dL — ABNORMAL HIGH (ref 65–99)
Glucose-Capillary: 158 mg/dL — ABNORMAL HIGH (ref 65–99)
Glucose-Capillary: 170 mg/dL — ABNORMAL HIGH (ref 65–99)
Glucose-Capillary: 206 mg/dL — ABNORMAL HIGH (ref 65–99)

## 2016-02-01 LAB — COMPREHENSIVE METABOLIC PANEL
ALK PHOS: 132 U/L — AB (ref 38–126)
ALT: 119 U/L — AB (ref 14–54)
AST: 100 U/L — AB (ref 15–41)
Albumin: 2.4 g/dL — ABNORMAL LOW (ref 3.5–5.0)
Anion gap: 14 (ref 5–15)
BUN: 35 mg/dL — AB (ref 6–20)
CALCIUM: 6.9 mg/dL — AB (ref 8.9–10.3)
CO2: 17 mmol/L — ABNORMAL LOW (ref 22–32)
CREATININE: 2.67 mg/dL — AB (ref 0.44–1.00)
Chloride: 110 mmol/L (ref 101–111)
GFR calc non Af Amer: 15 mL/min — ABNORMAL LOW (ref >60)
GFR, EST AFRICAN AMERICAN: 18 mL/min — AB (ref >60)
GLUCOSE: 173 mg/dL — AB (ref 65–99)
Potassium: 3.7 mmol/L (ref 3.5–5.1)
SODIUM: 141 mmol/L (ref 135–145)
Total Bilirubin: 0.6 mg/dL (ref 0.3–1.2)
Total Protein: 5.1 g/dL — ABNORMAL LOW (ref 6.5–8.1)

## 2016-02-01 LAB — ECHOCARDIOGRAM COMPLETE
HEIGHTINCHES: 63 in
Weight: 2720 oz

## 2016-02-01 LAB — CBC
HCT: 32.6 % — ABNORMAL LOW (ref 35.0–47.0)
Hemoglobin: 10.8 g/dL — ABNORMAL LOW (ref 12.0–16.0)
MCH: 29.5 pg (ref 26.0–34.0)
MCHC: 33.1 g/dL (ref 32.0–36.0)
MCV: 89.1 fL (ref 80.0–100.0)
PLATELETS: 54 10*3/uL — AB (ref 150–440)
RBC: 3.66 MIL/uL — AB (ref 3.80–5.20)
RDW: 13.9 % (ref 11.5–14.5)
WBC: 17.9 10*3/uL — ABNORMAL HIGH (ref 3.6–11.0)

## 2016-02-01 LAB — HEPATITIS PANEL, ACUTE
HEP B S AG: NEGATIVE
Hep A IgM: NEGATIVE
Hep B C IgM: NEGATIVE

## 2016-02-01 LAB — LACTIC ACID, PLASMA: LACTIC ACID, VENOUS: 3.3 mmol/L — AB (ref 0.5–1.9)

## 2016-02-01 MED ORDER — POTASSIUM CHLORIDE 20 MEQ PO PACK: 40.0000 meq | PACK | Freq: Once | ORAL | Status: DC

## 2016-02-01 MED ORDER — INSULIN GLARGINE 100 UNIT/ML ~~LOC~~ SOLN
5.0000 [IU] | Freq: Every day | SUBCUTANEOUS | Status: DC
  Administered 2016-02-01 – 2016-02-02 (×2): 5 [IU] via SUBCUTANEOUS
  Filled 2016-02-01 (×5): qty 0.05

## 2016-02-01 MED ORDER — INSULIN ASPART 100 UNIT/ML ~~LOC~~ SOLN
0.0000 [IU] | Freq: Three times a day (TID) | SUBCUTANEOUS | Status: DC
  Administered 2016-02-01: 2 [IU] via SUBCUTANEOUS
  Administered 2016-02-02: 5 [IU] via SUBCUTANEOUS
  Filled 2016-02-01: qty 5
  Filled 2016-02-01: qty 2

## 2016-02-01 MED ORDER — SODIUM CHLORIDE 0.9 % IV BOLUS (SEPSIS)
1000.0000 mL | Freq: Once | INTRAVENOUS | Status: AC
  Administered 2016-02-01: 1000 mL via INTRAVENOUS

## 2016-02-01 MED ORDER — HEPARIN SODIUM (PORCINE) 5000 UNIT/ML IJ SOLN: 5000.0000 [IU] | Freq: Three times a day (TID) | INTRAMUSCULAR | Status: DC

## 2016-02-01 MED ORDER — INSULIN ASPART 100 UNIT/ML ~~LOC~~ SOLN: 0.0000 [IU] | Freq: Three times a day (TID) | SUBCUTANEOUS | Status: DC

## 2016-02-01 MED ORDER — SODIUM CHLORIDE 0.9 % IV SOLN
30.0000 meq | Freq: Once | INTRAVENOUS | Status: AC
  Administered 2016-02-01: 30 meq via INTRAVENOUS
  Filled 2016-02-01: qty 15

## 2016-02-01 MED ORDER — INSULIN ASPART 100 UNIT/ML ~~LOC~~ SOLN
0.0000 [IU] | Freq: Every day | SUBCUTANEOUS | Status: DC
  Administered 2016-02-01: 2 [IU] via SUBCUTANEOUS
  Filled 2016-02-01: qty 2

## 2016-02-01 MED ORDER — DEXTROSE-NACL 5-0.45 % IV SOLN
INTRAVENOUS | Status: DC
  Administered 2016-02-01 (×2): via INTRAVENOUS

## 2016-02-01 NOTE — Progress Notes (Signed)
[place] INFECTIOUS DISEASE PROGRESS NOTE Date of Admission:  01/30/2016     ID: Sherry [name] is a 83 y.o. female with Active Problems:   Sepsis ([place])   Subjective: Remains confused, no fevers sicne 1/21.   ROS  Unable to obtain  Medications:  Antibiotics Given (last 72 hours)    Date/Time Action Medication Dose Rate   01/31/16 0304 Given   piperacillin-tazobactam (ZOSYN) IVPB 3.375 g 3.375 g 12.5 mL/hr   01/31/16 0305 Given   vancomycin (VANCOCIN) 500 mg in sodium chloride 0.9 % 100 mL IVPB 500 mg 100 mL/hr   01/31/16 1042 Given   piperacillin-tazobactam (ZOSYN) IVPB 3.375 g 3.375 g 12.5 mL/hr   01/31/16 1925 Given   cefTRIAXone (ROCEPHIN) IVPB 2 g 2 g 100 mL/hr   02/01/16 [id number] Given   cefTRIAXone (ROCEPHIN) IVPB 2 g 2 g 100 mL/hr     . cefTRIAXone  2 g Intravenous Daily  . gabapentin  300 mg Oral QHS  . insulin aspart  0-5 Units Subcutaneous QHS  . insulin aspart  0-9 Units Subcutaneous TID WC  . insulin glargine  5 Units Subcutaneous QHS  . insulin regular  0-10 Units Intravenous TID WC  . sodium chloride flush  3 mL Intravenous Q12H    Objective: Vital signs in last 24 hours: Temp:  [97.9 F (36.6 C)-98.9 F (37.2 C)] 98.1 F (36.7 C) (01/23 1142) Pulse Rate:  [88-111] 94 (01/23 1400) Resp:  [13-24] 17 (01/23 1400) BP: (71-147)/(35-67) 96/67 (01/23 1400) SpO2:  [89 %-100 %] 95 % (01/23 1400) Constitutional:  [place], criticall ill appearing HENT: [place]/AT, PERRLA, no scleral icterus Mouth/Throat: Oropharynx is clear and dry. No oropharyngeal exudate.  Cardiovascular: Tachy Pulmonary/Chest: [place]ort normal and breath sounds normal. No respiratory distress.  has no wheezes.  Neck = supple, no nuchal rigidity Abdominal: Soft. Mild distention Lymphadenopathy: no cervical adenopathy. No axillary adenopathy Neurological:lethargic Skin: Skin is warm and dry. No rash noted. No erythema.  Psychiatric: lethargic  Lab Results  Recent Labs  01/31/16 0312   02/01/16 0318 02/01/16 0542  WBC 14.3*  --   --  17.9*  HGB 11.4*  --   --  10.8*  HCT 34.0*  --   --  32.6*  NA 137  < > 138 141  K 3.5  < > 4.2 3.7  CL 104  < > 112* 110  CO2 21*  < > 15* 17*  BUN 31*  < > 35* 35*  CREATININE 2.19*  < > 2.71* 2.67*  < > = values in this interval not displayed.  Microbiology: Results for orders placed or performed during the hospital encounter of 01/30/16  Urine culture     Status: Abnormal (Preliminary result)   Collection Time: 01/30/16  4:17 PM  Result Value Ref Range Status   Specimen Description URINE, RANDOM  Final   Special Requests NONE  Final   Culture >=100,000 COLONIES/mL GRAM NEGATIVE RODS (A)  Final   Report Status PENDING  Incomplete  Blood Culture (routine x 2)     Status: Abnormal (Preliminary result)   Collection Time: 01/30/16  5:21 PM  Result Value Ref Range Status   Specimen Description BLOOD RIGHT ANTECUBITAL  Final   Special Requests BOTTLES DRAWN AEROBIC AND ANAEROBIC [place]  Final   Culture  Setup Time   Final    GRAM NEGATIVE RODS IN BOTH AEROBIC AND ANAEROBIC BOTTLES CRITICAL RESULT CALLED TO, READ BACK BY AND VERIFIED WITH: CHRISTINE KATSOUDAS ON 01/31/16 AT  1025 QSD    Culture (A)  Final    PROTEUS MIRABILIS SUSCEPTIBILITIES TO FOLLOW Performed at [facility], [place] [address] [place], [place] [address]    Report Status PENDING  Incomplete  Blood Culture ID Panel (Reflexed)     Status: Abnormal   Collection Time: 01/30/16  5:21 PM  Result Value Ref Range Status   Enterococcus species NOT DETECTED NOT DETECTED Final   Listeria monocytogenes NOT DETECTED NOT DETECTED Final   Staphylococcus species NOT DETECTED NOT DETECTED Final   Staphylococcus aureus NOT DETECTED NOT DETECTED Final   Streptococcus species NOT DETECTED NOT DETECTED Final   Streptococcus agalactiae NOT DETECTED NOT DETECTED Final   Streptococcus pneumoniae NOT DETECTED NOT DETECTED Final   Streptococcus pyogenes NOT DETECTED  NOT DETECTED Final   Acinetobacter baumannii NOT DETECTED NOT DETECTED Final   Enterobacteriaceae species DETECTED (A) NOT DETECTED Final    Comment: CRITICAL RESULT CALLED TO, READ BACK BY AND VERIFIED WITH: [place] ON 01/31/16 AT 1025 QSD    Enterobacter cloacae complex NOT DETECTED NOT DETECTED Final   Escherichia coli NOT DETECTED NOT DETECTED Final   Klebsiella oxytoca NOT DETECTED NOT DETECTED Final   Klebsiella pneumoniae NOT DETECTED NOT DETECTED Final   Proteus species DETECTED (A) NOT DETECTED Final    Comment: CRITICAL RESULT CALLED TO, READ BACK BY AND VERIFIED WITH: CHRISTINE KATSOUDAS ON 01/31/16 AT 1025 QSD    Serratia marcescens NOT DETECTED NOT DETECTED Final   Carbapenem resistance NOT DETECTED NOT DETECTED Final   Haemophilus influenzae NOT DETECTED NOT DETECTED Final   Neisseria meningitidis NOT DETECTED NOT DETECTED Final   Pseudomonas aeruginosa NOT DETECTED NOT DETECTED Final   Candida albicans NOT DETECTED NOT DETECTED Final   Candida glabrata NOT DETECTED NOT DETECTED Final   Candida krusei NOT DETECTED NOT DETECTED Final   Candida parapsilosis NOT DETECTED NOT DETECTED Final   Candida tropicalis NOT DETECTED NOT DETECTED Final  Blood Culture (routine x 2)     Status: Abnormal (Preliminary result)   Collection Time: 01/30/16  5:25 PM  Result Value Ref Range Status   Specimen Description BLOOD LEFT HAND  Final   Special Requests   Final    BOTTLES DRAWN AEROBIC AND ANAEROBIC 10CCAERO,10CCANA   Culture  Setup Time   Final    GRAM NEGATIVE RODS IN BOTH AEROBIC AND ANAEROBIC BOTTLES CRITICAL RESULT CALLED TO, READ BACK BY AND VERIFIED WITH: CHRISTINE KATSOUDAS ON 01/31/16 AT 1025 QSD    Culture PROTEUS MIRABILIS (A)  Final   Report Status PENDING  Incomplete  MRSA PCR Screening     Status: None   Collection Time: 01/31/16  5:47 AM  Result Value Ref Range Status   MRSA by PCR NEGATIVE NEGATIVE Final    Comment:        The GeneXpert MRSA Assay  (FDA approved for NASAL specimens only), is one component of a comprehensive MRSA colonization surveillance program. It is not intended to diagnose MRSA infection nor to guide or monitor treatment for MRSA infections.   C difficile quick scan w PCR reflex     Status: None   Collection Time: 01/31/16  7:33 AM  Result Value Ref Range Status   C Diff antigen NEGATIVE NEGATIVE Final   C Diff toxin NEGATIVE NEGATIVE Final   C Diff interpretation No C. difficile detected.  Final    Studies/Results: Dg Chest 1 View  Result Date: 01/30/2016 CLINICAL DATA:  Altered mental status.  Feeling poorly  EXAM: CHEST 1 VIEW COMPARISON:  None. FINDINGS: Normal mediastinum and cardiac silhouette. Normal pulmonary vasculature. No evidence of effusion, infiltrate, or pneumothorax. No acute bony abnormality. IMPRESSION: No acute cardiopulmonary process. Electronically Signed   By: Suzy Bouchard M.D.   On: 01/30/2016 16:17    Assessment/Plan: Sherry [name] is a 83 y.o. female with proteus bacteremia of unknown source - UA unimpressive but UCX with > 100 K GNR , CXR neg. Quite ill at this point. Remains on norepi.   Recommendations Continue ceftriaxone  May need abd imaging if remains septic despite fluid and vasopressor support but would not add much at this point.   Thank you very much for the consult. Will follow with you.  Merion Grimaldo P   02/01/2016, 2:42 PM

## 2016-02-01 NOTE — Consult Note (Signed)
Consultation Note Date: 02/01/2016   Patient Name: Sherry [name]  DOB: 08-14-1927  MRN: [id number]  Age / Sex: 83 y.o., female  PCP: [facility] Referring Physician: Vaughan Basta, MD  Reason for Consultation: Establishing goals of care and Psychosocial/spiritual support  HPI/Patient Profile: 83 y.o. female  admitted on 01/30/2016 with a known history of DM, HTN, HLD who was in a usual state of health/living independently until  patient was found by her daughter to have altered mental status.  EMS was called and she was subsequently admitted.   In  the ED, patient was febrile with a temperature of 102.5 degrees F, tachycardic and tachypneic. A code sepsis was called.  She was admitted to the ICU.    Family is faced with advanced directive decisions.      Clinical Assessment and Goals of Care:  This NP Wadie Lessen reviewed medical records, received report from team, assessed the patient and then meet at the patient's bedside along with her daughter Sherry [name]  to discuss diagnosis, prognosis, GOC, EOL wishes disposition and options.  A  discussion was had today regarding advanced directives.  Concepts specific to code status, artifical feeding and hydration, continued IV antibiotics and rehospitalization was had.  Values and goals of care important to patient and family were attempted to be elicited.   Daughter understands "how fragile my mother is" and "I don't want her to suffer"  but today sees great improvement and is hopeful for return to baseline. She will continue to make decisions regarding healthcare decisions dependant on outcomes.    Concept of Hospice and Palliative Care were discussed   Questions and concerns addressed.   Family encouraged to call with questions or concerns.  PMT will continue to support holistically.   NEXT OF KIN/daughter Sherry [name] is main decision  maker    SUMMARY OF RECOMMENDATIONS    Code Status/Advance Care Planning:  DNR   Palliative Prophylaxis:   Aspiration, Bowel Regimen, Delirium Protocol, Frequent Pain Assessment and Oral Care  Additional Recommendations (Limitations, Scope, Preferences):  Family is open to all offered and available medical interventions to prolong life, they are hopeful for continued improvement   Psycho-social/Spiritual:   Desire for further Chaplaincy support:yes  Prognosis:   Unable to determine  Discharge Planning: To Be Determined      Primary Diagnoses: Present on Admission: . Sepsis ([place])   I have reviewed the medical record, interviewed the patient and family, and examined the patient. The following aspects are pertinent.  Past Medical History:  Diagnosis Date  . Diabetes mellitus without complication ([place])   . Hyperlipidemia   . Hypertension    Social History   Social History  . Marital status: Single    Spouse name: N/A  . Number of children: N/A  . Years of education: N/A   Social History Main Topics  . Smoking status: Never Smoker  . Smokeless tobacco: Never Used  . Alcohol use No  . Drug use: No  . Sexual activity: Not  Asked   Other Topics Concern  . None   Social History Narrative  . None   No family history on file. Scheduled Meds: . cefTRIAXone  2 g Intravenous Daily  . gabapentin  300 mg Oral QHS  . insulin aspart  0-5 Units Subcutaneous QHS  . insulin aspart  0-9 Units Subcutaneous TID WC  . insulin glargine  5 Units Subcutaneous QHS  . insulin regular  0-10 Units Intravenous TID WC  . potassium chloride (KCL MULTIRUN) 30 mEq in 265 mL IVPB  30 mEq Intravenous Once  . sodium chloride flush  3 mL Intravenous Q12H   Continuous Infusions: . dextrose 5 % and 0.45% NaCl 100 mL/hr at 02/01/16 0910  . insulin (NOVOLIN-R) infusion 2.3 Units/hr (02/01/16 1112)  . norepinephrine (LEVOPHED) Adult infusion 18 mcg/min (02/01/16 0915)  .  sodium  bicarbonate  infusion 1000 mL 100 mL/hr at 02/01/16 0550   PRN Meds:.acetaminophen **OR** acetaminophen, albuterol, bisacodyl, clonazePAM, dextrose, ipratropium, magnesium citrate, ondansetron **OR** ondansetron (ZOFRAN) IV, oxyCODONE, oxyCODONE-acetaminophen, senna-docusate Medications Prior to Admission:  Prior to Admission medications   Medication Sig Start Date End Date Taking? Authorizing Provider  atorvastatin (LIPITOR) 20 MG tablet Take 1 tablet by mouth at bedtime.   Yes Historical Provider, MD  diclofenac sodium (VOLTAREN) 1 % GEL Apply 2 g topically 4 (four) times daily.   Yes Historical Provider, MD  gabapentin (NEURONTIN) 300 MG capsule Take 1 capsule by mouth at bedtime.   Yes Historical Provider, MD  glipiZIDE (GLUCOTROL XL) 10 MG 24 hr tablet Take 1 tablet by mouth daily.   Yes Historical Provider, MD  lisinopril-hydrochlorothiazide (PRINZIDE,ZESTORETIC) 10-12.5 MG per tablet Take 1 tablet by mouth daily.   Yes Historical Provider, MD  metFORMIN (GLUCOPHAGE) 1000 MG tablet Take 1,000 mg by mouth 2 (two) times daily.   Yes Historical Provider, MD  oxyCODONE-acetaminophen (PERCOCET/ROXICET) 5-325 MG tablet Take 1 tablet by mouth every 4 (four) hours as needed for moderate pain or severe pain.   Yes Historical Provider, MD  Elastic Bandages & Supports (T.E.D. KNEE LENGTH/M-LONG) MISC 2 application by Does not apply route every morning. 11/05/15   Merlyn Lot, MD  naproxen sodium (ANAPROX) 275 MG tablet Take 1 tablet (275 mg total) by mouth 2 (two) times daily with a meal. Patient not taking: Reported on 01/30/2016 10/20/15 10/19/16  Sable Feil, PA-C   No Known Allergies Review of Systems  Unable to perform ROS: Acuity of condition    Physical Exam  Constitutional: She appears lethargic. She appears ill. Nasal cannula in place.  - generalized BUE +2 edema  HENT:  Mouth/Throat: Mucous membranes are dry.  Cardiovascular: Tachycardia present.   Pulmonary/Chest: She has  decreased breath sounds in the right lower field and the left lower field.  Neurological: She appears lethargic.  Skin: Skin is warm and dry.    Vital Signs: BP (!) 96/52   Pulse 93   Temp 98.1 F (36.7 C) (Oral)   Resp 17   Ht 5\' 3"  (1.6 m)   Wt 77.1 kg (170 lb)   SpO2 94%   BMI 30.11 kg/m  Pain Assessment: No/denies pain   Pain Score: Asleep   SpO2: SpO2: 94 % O2 Device:SpO2: 94 % O2 Flow Rate: .O2 Flow Rate (L/min): 2.5 L/min  IO: Intake/output summary:  Intake/Output Summary (Last 24 hours) at 02/01/16 1300 Last data filed at 02/01/16 1139  Gross per 24 hour  Intake          9633.88 ml  Output              650 ml  Net          8983.88 ml    LBM: Last BM Date: 01/30/16 Baseline Weight: Weight: 77.1 kg (170 lb) Most recent weight: Weight: 77.1 kg (170 lb)      Palliative Assessment/Data: 20%   Discussed with Dr Anselm Jungling  Time In: 1030 Time Out: 1145  Time Total: 75 min Greater than 50%  of this time was spent counseling and coordinating care related to the above assessment and plan.  Signed by: Wadie Lessen, NP   Please contact Palliative Medicine Team phone at [phone] for questions and concerns.  For individual provider: See Shea Evans

## 2016-02-01 NOTE — Progress Notes (Signed)
[place] for electrolytes Indication: insulin drip  No Known Allergies  Patient Measurements: Height: 5\' 3"  (160 cm) Weight: 170 lb (77.1 kg) IBW/kg (Calculated) : 52.4  Vital Signs: Temp: 98.1 F (36.7 C) (01/23 1142) Temp Source: Oral (01/23 1142) BP: 94/53 (01/23 1500) Pulse Rate: 92 (01/23 1500) Intake/Output from previous day: 01/22 0701 - 01/23 0700 In: 8760.9 [I.V.:7710.9; IV Piggyback:1050] Out: -  Intake/Output from this shift: Total I/O In: 3276.6 [I.V.:1961.6; IV Piggyback:1315] Out: 650 [Urine:650]  Labs:  Recent Labs  01/30/16 1720 01/30/16 2335 01/31/16 0312 01/31/16 0927  01/31/16 2051 02/01/16 0318 02/01/16 0542  WBC  --   --  14.3*  --   --   --   --  17.9*  HGB  --   --  11.4*  --   --   --   --  10.8*  HCT  --   --  34.0*  --   --   --   --  32.6*  PLT  --   --  126*  --   --   --   --  54*  APTT  --  33 44*  --   --   --   --   --   CREATININE 1.51*  --  2.19* 2.23*  < > 2.56* 2.71* 2.67*  MG  --   --   --  1.4*  --  2.2  --   --   PHOS  --   --   --  2.6  --   --   --   --   ALBUMIN 3.2*  --  2.9*  --   --   --   --  2.4*  PROT 5.8*  --  5.7*  --   --   --   --  5.1*  AST 409*  --  170*  --   --   --   --  100*  ALT 194*  --  140*  --   --   --   --  119*  ALKPHOS 128*  --  129*  --   --   --   --  132*  BILITOT 0.7  --  0.3  --   --   --   --  0.6  < > = values in this interval not displayed. Estimated Creatinine Clearance: 14.9 mL/min (by C-G formula based on SCr of 2.67 mg/dL (H)).   Medical History: Past Medical History:  Diagnosis Date  . Diabetes mellitus without complication ([place])   . Hyperlipidemia   . Hypertension      Assessment: 83yo admitted 1/21 with AMS/diarrhea. Blood sugars have been high since admission around 400s. Will start patient on insulin drip.   Goal of Therapy:  K 3.5-5  Plan:  K 3.7= potassium chloride 28mEq IV x1 given.  Will continue to monitor and  adjust per consult.   Loree Fee, PharmD 02/01/2016,4:01 PM

## 2016-02-01 NOTE — Progress Notes (Addendum)
Inpatient Diabetes Program Recommendations  AACE/ADA: New Consensus Statement on Inpatient Glycemic Control (2015)  Target Ranges:  Prepandial:   less than 140 mg/dL      Peak postprandial:   less than 180 mg/dL (1-2 hours)      Critically ill patients:  140 - 180 mg/dL   Lab Results  Component Value Date   GLUCAP 130 (H) 02/01/2016   HGBA1C 5.8 11/26/2014    Review of Glycemic Control  Results for Sherry [name], Sherry [name] (MRN [id number]) as of 02/01/2016 08:40  Ref. Range 02/01/2016 02:56 02/01/2016 05:08 02/01/2016 06:02 02/01/2016 06:54 02/01/2016 07:56  Glucose-Capillary Latest Ref Range: 65 - 99 mg/dL 232 (H) 187 (H) 138 (H) 141 (H) 130 (H)    Diabetes history: Type 2 Outpatient Diabetes medications: Glipizide 10 mg qday, Metformin 1000mg  bid Current orders for Inpatient glycemic control: IV insulin/glucostabilizer order set with boluses for food intake   Inpatient Diabetes Program Recommendations:   When CBG is within range (140-180mg /dl) for 4 consecutive hours, call MD for insulin transition orders using the Glycemic Control order set including basal, bolus and meal coverage as indicated  Basal insulin should be given 2 hours before the drip is stopped and Novolog SQ correction scale must be administered simultaneously when the drip is discontinued.   Gentry Fitz, RN, BA, MHA, CDE Diabetes Coordinator Inpatient Diabetes Program  [phone] (Team Pager) [phone] ([place]) 02/01/2016 8:46 AM

## 2016-02-01 NOTE — NC FL2 (Signed)
[place] LEVEL OF CARE SCREENING TOOL     IDENTIFICATION  Patient Name: Sherry [name] Birthdate: 04-20-1927 Sex: female Admission Date (Current Location): 01/30/2016  [place] and [place] Number:  Engineering geologist and Address:  [facility], [address], [place], [place] [address]      Provider Number: [id number]  Attending Physician Name and Address:  Vaughan Basta, MD  Relative Name and Phone Number:       Current Level of Care: Hospital Recommended Level of Care: [place] Prior Approval Number:    Date Approved/Denied:   PASRR Number:  ([id number])  Discharge Plan: SNF    Current Diagnoses: Patient Active Problem List   Diagnosis Date Noted  . Sepsis ([place]) 11/26/2014    Orientation RESPIRATION BLADDER Height & Weight     Self  O2 (Nasal Cannula 2.5L/min.) External catheter Weight: 170 lb (77.1 kg) Height:  5\' 3"  (160 cm)  BEHAVIORAL SYMPTOMS/MOOD NEUROLOGICAL BOWEL NUTRITION STATUS   (None.)  (None.) Incontinent Diet (Diet: Heart Healthy/Carb Modified)  AMBULATORY STATUS COMMUNICATION OF NEEDS Skin   Extensive Assist Verbally Normal                       Personal Care Assistance Level of Assistance  Bathing, Feeding, Dressing Bathing Assistance: Limited assistance Feeding assistance: Independent Dressing Assistance: Limited assistance     Functional Limitations Info  Sight, Hearing, Speech Sight Info: Adequate Hearing Info: Adequate Speech Info: Impaired (Upper dentures missing)    SPECIAL CARE FACTORS FREQUENCY  PT (By licensed PT), OT (By licensed OT)     PT Frequency:  (5) OT Frequency:  (5)            Contractures      Additional Factors Info  Code Status, Allergies, Insulin Sliding Scale Code Status Info:  (DNR) Allergies Info:  (No Known Allergies)   Insulin Sliding Scale Info:  (NovoLog, Lantus )    Palliative Services recommended by team.   Current  Medications (02/01/2016):  This is the current hospital active medication list Current Facility-Administered Medications  Medication Dose Route Frequency Provider Last Rate Last Dose  . acetaminophen (TYLENOL) tablet 650 mg  650 mg Oral Q6H PRN Alexis Hugelmeyer, DO       Or  . acetaminophen (TYLENOL) suppository 650 mg  650 mg Rectal Q6H PRN Alexis Hugelmeyer, DO   650 mg at 01/30/16 2219  . albuterol (PROVENTIL) (2.5 MG/3ML) 0.083% nebulizer solution 2.5 mg  2.5 mg Nebulization Q6H PRN Alexis Hugelmeyer, DO      . bisacodyl (DULCOLAX) EC tablet 5 mg  5 mg Oral Daily PRN Alexis Hugelmeyer, DO      . cefTRIAXone (ROCEPHIN) IVPB 2 g  2 g Intravenous Daily Flora Lipps, MD   2 g at 02/01/16 0925  . clonazePAM (KLONOPIN) tablet 0.25 mg  0.25 mg Oral TID PRN Vaughan Basta, MD      . dextrose 5 %-0.45 % sodium chloride infusion   Intravenous Continuous Awilda Bill, NP 100 mL/hr at 02/01/16 0910    . dextrose 50 % solution 25 mL  25 mL Intravenous PRN Flora Lipps, MD      . gabapentin (NEURONTIN) capsule 300 mg  300 mg Oral QHS Alexis Hugelmeyer, DO   300 mg at 01/31/16 2152  . insulin aspart (novoLOG) injection 0-5 Units  0-5 Units Subcutaneous QHS Flora Lipps, MD      . insulin aspart (novoLOG) injection 0-9 Units  0-9 Units Subcutaneous TID WC Flora Lipps, MD      . insulin glargine (LANTUS) injection 5 Units  5 Units Subcutaneous QHS Flora Lipps, MD   5 Units at 02/01/16 1325  . insulin regular (NOVOLIN R,HUMULIN R) 250 Units in sodium chloride 0.9 % 250 mL (1 Units/mL) infusion   Intravenous Continuous Flora Lipps, MD   Stopped at 02/01/16 1527  . insulin regular bolus via infusion 0-10 Units  0-10 Units Intravenous TID WC Flora Lipps, MD      . ipratropium (ATROVENT) nebulizer solution 0.5 mg  0.5 mg Nebulization Q6H PRN Alexis Hugelmeyer, DO      . magnesium citrate solution 1 Bottle  1 Bottle Oral Once PRN Alexis Hugelmeyer, DO      . norepinephrine (LEVOPHED) 4 mg in dextrose 5 % 250  mL (0.016 mg/mL) infusion  0-40 mcg/min Intravenous Titrated Pavan Pyreddy, MD 52.5 mL/hr at 02/01/16 1534 14 mcg/min at 02/01/16 1534  . ondansetron (ZOFRAN) tablet 4 mg  4 mg Oral Q6H PRN Alexis Hugelmeyer, DO       Or  . ondansetron (ZOFRAN) injection 4 mg  4 mg Intravenous Q6H PRN Alexis Hugelmeyer, DO      . oxyCODONE (Oxy IR/ROXICODONE) immediate release tablet 5 mg  5 mg Oral Q4H PRN Alexis Hugelmeyer, DO   5 mg at 02/01/16 0017  . oxyCODONE-acetaminophen (PERCOCET/ROXICET) 5-325 MG per tablet 1 tablet  1 tablet Oral Q4H PRN Alexis Hugelmeyer, DO   1 tablet at 01/31/16 1124  . senna-docusate (Senokot-S) tablet 1 tablet  1 tablet Oral QHS PRN Alexis Hugelmeyer, DO      . sodium bicarbonate 150 mEq in dextrose 5 % 1,000 mL infusion   Intravenous Continuous Flora Lipps, MD 100 mL/hr at 02/01/16 0550    . sodium chloride flush (NS) 0.9 % injection 3 mL  3 mL Intravenous Q12H Alexis Hugelmeyer, DO   3 mL at 02/01/16 1000     Discharge Medications: Please see discharge summary for a list of discharge medications.  Relevant Imaging Results:  Relevant Lab Results:   Additional Information  (SSN: [id number])  Patient to have palliative services at SNF.  Danie Chandler, Student-Social Work

## 2016-02-01 NOTE — Progress Notes (Signed)
[facility] [place] NAME: Sherry [name]    MR#:  [id number]  DATE OF BIRTH:  [birth year]  SUBJECTIVE:  CHIEF COMPLAINT:   Chief Complaint  Patient presents with  . Altered Mental Status  . Diarrhea   Brought with c/o nausea and diarrhea- found to be in sepsis with bacteremia. Very critical and drowsy not able to give details. Her grand daughter was in room - when I saw. REVIEW OF SYSTEMS:  Pt is drowsy and not able to give much details. ROS  DRUG ALLERGIES:  No Known Allergies  VITALS:  Blood pressure (!) 119/59, pulse (!) 104, temperature 98.3 F (36.8 C), resp. rate 20, height 5\' 3"  (1.6 m), weight 77.1 kg (170 lb), SpO2 95 %.  PHYSICAL EXAMINATION:  GENERAL:  83 y.o.-year-old patient lying in the bed with acute distress.drowsy, critical appearing.  EYES: Pupils equal, round, reactive to light and accommodation. No scleral icterus. Extraocular muscles intact.  HEENT: Head atraumatic, normocephalic. Oropharynx and nasopharynx clear.  NECK:  Supple, no jugular venous distention. No thyroid enlargement, no tenderness.  LUNGS: Normal breath sounds bilaterally, no wheezing, rales,rhonchi or crepitation. No use of accessory muscles of respiration.  CARDIOVASCULAR: S1, S2 normal. No murmurs, rubs, or gallops.  ABDOMEN: Soft, nontender, nondistended. Bowel sounds present. No organomegaly or mass.  EXTREMITIES: No pedal edema, cyanosis, or clubbing.  NEUROLOGIC: Pt is drowsy and opens eyes to stimuli, and moans few words, but does not communicate. PSYCHIATRIC: The patient is alert and oriented x 3.  SKIN: No obvious rash, lesion, or ulcer.   Physical Exam LABORATORY PANEL:   CBC  Recent Labs Lab 02/01/16 0542  WBC 17.9*  HGB 10.8*  HCT 32.6*  PLT 54*   ------------------------------------------------------------------------------------------------------------------  Chemistries   Recent Labs Lab 01/31/16 2051  02/01/16 0542   NA 137  < > 141  K 3.8  < > 3.7  CL 110  < > 110  CO2 16*  < > 17*  GLUCOSE 271*  < > 173*  BUN 35*  < > 35*  CREATININE 2.56*  < > 2.67*  CALCIUM 7.0*  < > 6.9*  MG 2.2  --   --   AST  --   --  100*  ALT  --   --  119*  ALKPHOS  --   --  132*  BILITOT  --   --  0.6  < > = values in this interval not displayed. ------------------------------------------------------------------------------------------------------------------  Cardiac Enzymes  Recent Labs Lab 01/30/16 1720  TROPONINI 0.05*   ------------------------------------------------------------------------------------------------------------------  RADIOLOGY:  Dg Chest 1 View  Result Date: 01/30/2016 CLINICAL DATA:  Altered mental status.  Feeling poorly EXAM: CHEST 1 VIEW COMPARISON:  None. FINDINGS: Normal mediastinum and cardiac silhouette. Normal pulmonary vasculature. No evidence of effusion, infiltrate, or pneumothorax. No acute bony abnormality. IMPRESSION: No acute cardiopulmonary process. Electronically Signed   By: Suzy Bouchard M.D.   On: 01/30/2016 16:17    ASSESSMENT AND PLAN:   Active Problems:   Sepsis ([place])  * septic shock- gram negative bacteremia   Lactic acidosis      IV zosyn, Levophed drip.   IV fluids   ID and CCM consult.   Flu and c diff negative.   No PNA on Xray chest.   Prognosis extremely poor.  * Ac on CKD stage 3   IV fluids and monitor Out put.  * Dm   Hold oral meds, keep NPO for now.  Finger stick.  * hypomagnesemia   Replace.  * metabolic encephalopathy    Due to septic shock.       All the records are reviewed and case discussed with Care Management/Social Workerr. Management plans discussed with the patient, family and they are in agreement.  CODE STATUS: [place].  TOTAL TIME TAKING CARE OF THIS PATIENT: 40 critical care minutes.   prognosis very poor, spoke to grand daughter in room.  POSSIBLE D/C IN 2-3 DAYS, DEPENDING ON CLINICAL  CONDITION.   Vaughan Basta M.D on 02/01/2016   Between 7am to 6pm - Pager - [phone]  After 6pm go to [email or web address] - password [email or web address] [place] [facility]  Office  [phone]  CC: Primary care physician; [facility]  Note: This dictation was prepared with Dragon dictation along with smaller phrase technology. Any transcriptional errors that result from this process are unintentional.

## 2016-02-01 NOTE — Progress Notes (Signed)
Chaplain was making rounds and visited with pt in room IC-6. Provided the ministry of prayer and a pastoral presence.    02/01/16 1345  Clinical Encounter Type  Visited With Patient  Visit Type Initial;Spiritual support  Referral From Nurse  Spiritual Encounters  Spiritual Needs Prayer

## 2016-02-01 NOTE — Progress Notes (Signed)
Thank you for consulting the Palliative Medicine Team at [facility] to meet your patient's and family's needs.   The reason that you asked [place] to see your patient is -- To establish GOCs   Daughter cancelled appointment for today so I have scheduled your patient for a meeting: Tomorrow at [place] NP  Palliative Medicine Team Team Phone # [phone] Pager [phone]  No charge

## 2016-02-01 NOTE — Progress Notes (Signed)
PT Cancellation Note  Patient Details Name: Sherry [name] MRN: [id number] DOB: 18-Jul-1927   Cancelled Treatment:    Reason Eval/Treat Not Completed: Fatigue/lethargy limiting ability to participate.  Per RN pt is not opening her eyes today and level of arousal not conducive for PT treatment.  PT will continue to follow acutely.   Collie Siad PT, DPT 02/01/2016, 11:53 AM

## 2016-02-01 NOTE — Clinical Social Work Note (Addendum)
Clinical Social Work Assessment  Patient Details  Name: Sherry [name] MRN: [id number] Date of Birth: [birth year]  Date of referral:  02/01/16               Reason for consult:  Facility Placement                Permission sought to share information with:  Chartered certified accountant granted to share information::  Yes, Verbal Permission Granted  Name::      [place]::   [name]   Relationship::     Contact Information:     Housing/Transportation Living arrangements for the past 2 months:  [place] of Information:  Adult Children Patient Interpreter Needed:  None Criminal Activity/Legal Involvement Pertinent to Current Situation/Hospitalization:  No - Comment as needed Significant Relationships:  Adult Children Lives with:  Adult Children Do you feel safe going back to the place where you live?    Need for family participation in patient care:  Yes (Comment)  Care giving concerns:  Patient lives in [place] with her son Sherry [name].    Social Worker assessment / plan: [place] representative (CSW) received SNF consult. PT is recommending SNF. Per chart patient is not alert and oriented. CSW contacted patient's daughter Sherry [name] to complete assessment. Per daughter patient lives in [place] with her son Sherry [name] however daughter reported that patient is going to live with her in [place]. CSW explained that PT is recommending SNF and explained SNF process. Daughter is agreeable to SNF search in [place]. FL2 complete and faxed out. CSW will continue to follow and assist as needed.   Employment status:  Retired Nurse, adult PT Recommendations:  [place] / Referral to community resources:  [place]  Patient/Family's Response to care:  Patient's daughter is agreeable to [facility] in [place].   Patient/Family's Understanding of and Emotional  Response to Diagnosis, Current Treatment, and Prognosis: Patient's daughter was pleasant and thanked CSW for assistance.   Emotional Assessment Appearance:  Appears stated age Attitude/Demeanor/Rapport:  Unable to Assess Affect (typically observed):  Unable to Assess Orientation:  Oriented to Self, Fluctuating Orientation (Suspected and/or reported [place]) Alcohol / Substance use:  Not Applicable Psych involvement (Current and /or in the community):  No (Comment)  Discharge Needs  Concerns to be addressed:  Discharge Planning Concerns Readmission within the last 30 days:  No Current discharge risk:  Dependent with Mobility Barriers to Discharge:  Continued Medical Work up   [employer], Veronia Beets, LCSW 02/01/2016, 4:13 PM

## 2016-02-01 NOTE — Clinical Social Work Placement (Signed)
   CLINICAL SOCIAL WORK PLACEMENT  NOTE  Date:  02/01/2016  Patient Details  Name: Sherry [name] MRN: [id number] Date of Birth: May 28, 1927  Clinical Social Work is seeking post-discharge placement for this patient at the [place] level of care (*CSW will initial, date and re-position this form in  chart as items are completed):  Yes   Patient/family provided with [place] Work Department's list of facilities offering this level of care within the geographic area requested by the patient (or if unable, by the patient's family).  Yes   Patient/family informed of their freedom to choose among providers that offer the needed level of care, that participate in Medicare, Medicaid or managed care program needed by the patient, have an available bed and are willing to accept the patient.  Yes   Patient/family informed of [facility]'s ownership interest in [facility] and [facility], as well as of the fact that they are under no obligation to receive care at these facilities.  PASRR submitted to EDS on 02/01/16     PASRR number received on 02/01/16     Existing PASRR number confirmed on       FL2 transmitted to all facilities in geographic area requested by pt/family on 02/01/16     FL2 transmitted to all facilities within larger geographic area on       Patient informed that his/her managed care company has contracts with or will negotiate with certain facilities, including the following:            Patient/family informed of bed offers received.  Patient chooses bed at       Physician recommends and patient chooses bed at      Patient to be transferred to   on  .  Patient to be transferred to facility by       Patient family notified on   of transfer.  Name of family member notified:        PHYSICIAN       Additional Comment:    _______________________________________________ Franz Svec, Veronia Beets, LCSW 02/01/2016, 4:10 PM

## 2016-02-01 NOTE — Consult Note (Signed)
PULMONARY / CRITICAL CARE MEDICINE   Name: Sherry [name] MRN: [id number] DOB: 1927-11-02    ADMISSION DATE:  01/30/2016   CONSULTATION DATE:  01/31/2016  REFERRING MD:  Dr. Orbie Pyo  CHIEF COMPLAINT:  Hypotension and AMS  HISTORY OF PRESENT ILLNESS:  [name] is an 83 year old Caucasian female with a past medical history of type 2 diabetes mellitus, hypertension and hyperlipidemia who presented to the ED via EMS with complaints of altered mental status and weakness. Her daughter called EMS because she found the patient in a recliner [place] in feces. Patient states that she hasn't been feeling well for the past two days. She reports one loose stool and one episode of nausea and vomiting. She is unable to characterize the nature of the emesis. Her blood sugar on the field was greater than 400 mg/dl. At the ED, patient was febrile with a temperature of 102.5 degrees F, tachycardic and tachypneic. A code sepsis was called.  She was admitted to the Hospitalist service and started on IV fluids and empiric antibiotics. Upon arrival on the floor, patient became more lethargic, and hypotensive. Her lactic acid level increased from 5.7 to 6.6 despite fluid resuscitation. PCCM was consulted to assume patient's management. She denies pain and difficulty breathing but reports feeling tired.     REVIEW OF SYSTEMS:   obtunded  SUBJECTIVE:  Remains obtunded, on vasopressors Has acidosis, prognosis is poor  VITAL SIGNS: BP (!) 119/59   Pulse (!) 104   Temp 98.3 F (36.8 C)   Resp 20   Ht 5\' 3"  (1.6 m)   Wt 170 lb (77.1 kg)   SpO2 95%   BMI 30.11 kg/m    INTAKE / OUTPUT: I/O last 3 completed shifts: In: 9610.9 [I.V.:7710.9; IV Piggyback:1900] Out: -   PHYSICAL EXAMINATION: General: Acutely ill-looking Neuro: AAO X2, speech is normal,follows commands and moves all extremities HEENT: High [place]/AT, PERRLA, Oral mucosa dry, trachea midline Cardiovascular: RRR, S1/S2, no MRG, +2 pulses bilaterally, no  edema Lungs: increased WOB, CTAB, diminished in the bases Abdomen:  Non-distended, normal bowel sounds Musculoskeletal:  No joint swelling, +rom in UE/LE Skin: Cold, no rash or lesions  LABS:  BMET  Recent Labs Lab 01/31/16 2051 02/01/16 0318 02/01/16 0542  NA 137 138 141  K 3.8 4.2 3.7  CL 110 112* 110  CO2 16* 15* 17*  BUN 35* 35* 35*  CREATININE 2.56* 2.71* 2.67*  GLUCOSE 271* 226* 173*    Electrolytes  Recent Labs Lab 01/31/16 0927  01/31/16 2051 02/01/16 0318 02/01/16 0542  CALCIUM 7.3*  < > 7.0* 6.8* 6.9*  MG 1.4*  --  2.2  --   --   PHOS 2.6  --   --   --   --   < > = values in this interval not displayed.  CBC  Recent Labs Lab 01/31/16 0312 02/01/16 0542  WBC 14.3* 17.9*  HGB 11.4* 10.8*  HCT 34.0* 32.6*  PLT 126* 54*    Coag's  Recent Labs Lab 01/30/16 2335 01/31/16 0312  APTT 33 44*  INR 1.10 1.19    Sepsis Markers  Recent Labs Lab 01/30/16 2335 01/31/16 0312 01/31/16 0631 01/31/16 0927  LATICACIDVEN 5.7* 6.6* 3.5* 4.8*  PROCALCITON >200.00  --   --   --     ABG  Recent Labs Lab 01/31/16 1615  PHART 7.28*  PCO2ART 37  PO2ART 66*    Liver Enzymes  Recent Labs Lab 01/30/16 1720 01/31/16 0312 02/01/16 0542  AST  409* 170* 100*  ALT 194* 140* 119*  ALKPHOS 128* 129* 132*  BILITOT 0.7 0.3 0.6  ALBUMIN 3.2* 2.9* 2.4*    Cardiac Enzymes  Recent Labs Lab 01/30/16 1720  TROPONINI 0.05*    Glucose  Recent Labs Lab 02/01/16 0158 02/01/16 0256 02/01/16 0508 02/01/16 0602 02/01/16 0654 02/01/16 0756  GLUCAP 238* 232* 187* 138* 141* 130*    Imaging No results found.  STUDIES:  None  CULTURES: Blood cultures x 2 01/22> Urine culture 01/22>  ANTIBIOTICS: Zosyn 01/21>>   SIGNIFICANT EVENTS: 01/21>ED with sepsis of unknown source  LINES/TUBES: PIVs  DISCUSSION: 83 y/o WF presenting with septic shock, sepsis of unknown origin, severe hyperglycemia and gastroenteritis proteus  bacteremia  ASSESSMENT / PLAN:  PULMONARY A: Lactic and metabolic acidosis Hypoxia secondary to sepsis P:   Supplemental O2 prn  CARDIOVASCULAR A:  Septic shock with superimpose hypovolemic shock 2/2 diarrhea/vomiting H/o hypertension H/o Hyperlipidemia P:  IV fluid resuscitation Norepinephrine infusion; titrate to keep MAP>65 Patient may need a Central venous catheter if shock is refractory to IV fluids Hemodynamic monitoring per ICU protocol 2-D echo Hold all antihypertensives   RENAL A:   Acute on chronic renal failure-baseline creatinine 1.1; now 2.2. Hypomagnesema P:   Trend creatinine IV fluids Monitor and replace electrolytes Will consider nephology consult if kidney function continues to decline  GASTROINTESTINAL A:   Acute gastroenteritis-Viral versus bacteria NPO for now  INFECTIOUS A:   Fever and leukocytosis P:   proteus bacteremia Follow up ucx  ENDOCRINE A:   Severe hyperglycemia   P:   ICU glycemic control protocol   NEUROLOGIC A:   Acute metabolic encephalopathy 2/2 sepsis and septic shock P:   RASS goal: n/a Monitor neurologic status Treat for for sepsis/septic shock as above  I have personally obtained a history, examined the patient, evaluated Pertinent laboratory and RadioGraphic/imaging results, and  formulated the assessment and plan   The Patient requires high complexity decision making for assessment and support, frequent evaluation and titration of therapies, application of advanced monitoring technologies and extensive interpretation of multiple databases. Critical Care Time devoted to patient care services described in this note is 35 minutes.   Overall, patient is critically ill, prognosis is guarded.  Patient with Multiorgan failure and at high risk for cardiac arrest and death.    Corrin Parker, M.D.  [facility]  Medical Director [place] Director [facility]  Cardio-Pulmonary Department

## 2016-02-02 DIAGNOSIS — Z66 Do not resuscitate: Secondary | ICD-10-CM

## 2016-02-02 DIAGNOSIS — Z515 Encounter for palliative care: Secondary | ICD-10-CM

## 2016-02-02 LAB — BASIC METABOLIC PANEL
Anion gap: 12 (ref 5–15)
BUN: 41 mg/dL — AB (ref 6–20)
CHLORIDE: 104 mmol/L (ref 101–111)
CO2: 20 mmol/L — ABNORMAL LOW (ref 22–32)
Calcium: 6.6 mg/dL — ABNORMAL LOW (ref 8.9–10.3)
Creatinine, Ser: 3.12 mg/dL — ABNORMAL HIGH (ref 0.44–1.00)
GFR calc Af Amer: 15 mL/min — ABNORMAL LOW (ref >60)
GFR calc non Af Amer: 13 mL/min — ABNORMAL LOW (ref >60)
GLUCOSE: 297 mg/dL — AB (ref 65–99)
Potassium: 4.6 mmol/L (ref 3.5–5.1)
Sodium: 136 mmol/L (ref 135–145)

## 2016-02-02 LAB — CULTURE, BLOOD (ROUTINE X 2)

## 2016-02-02 LAB — URINE CULTURE

## 2016-02-02 LAB — GLUCOSE, CAPILLARY
GLUCOSE-CAPILLARY: 190 mg/dL — AB (ref 65–99)
GLUCOSE-CAPILLARY: 174 mg/dL — AB (ref 65–99)
Glucose-Capillary: 289 mg/dL — ABNORMAL HIGH (ref 65–99)
Glucose-Capillary: 306 mg/dL — ABNORMAL HIGH (ref 65–99)

## 2016-02-02 LAB — CBC
HCT: 33.2 % — ABNORMAL LOW (ref 35.0–47.0)
Hemoglobin: 11.2 g/dL — ABNORMAL LOW (ref 12.0–16.0)
MCH: 28.8 pg (ref 26.0–34.0)
MCHC: 33.8 g/dL (ref 32.0–36.0)
MCV: 85.5 fL (ref 80.0–100.0)
Platelets: 35 10*3/uL — ABNORMAL LOW (ref 150–440)
RBC: 3.89 MIL/uL (ref 3.80–5.20)
RDW: 14 % (ref 11.5–14.5)
WBC: 22.7 10*3/uL — ABNORMAL HIGH (ref 3.6–11.0)

## 2016-02-02 MED ORDER — INSULIN ASPART 100 UNIT/ML ~~LOC~~ SOLN
0.0000 [IU] | Freq: Three times a day (TID) | SUBCUTANEOUS | Status: DC
  Administered 2016-02-02: 3 [IU] via SUBCUTANEOUS
  Administered 2016-02-02: 11 [IU] via SUBCUTANEOUS
  Administered 2016-02-03: 8 [IU] via SUBCUTANEOUS
  Administered 2016-02-03: 2 [IU] via SUBCUTANEOUS
  Administered 2016-02-03: 8 [IU] via SUBCUTANEOUS
  Administered 2016-02-04 (×2): 5 [IU] via SUBCUTANEOUS
  Administered 2016-02-04: 2 [IU] via SUBCUTANEOUS
  Administered 2016-02-05: 5 [IU] via SUBCUTANEOUS
  Administered 2016-02-05 (×2): 3 [IU] via SUBCUTANEOUS
  Administered 2016-02-06: 12:00:00 8 [IU] via SUBCUTANEOUS
  Administered 2016-02-06: 09:00:00 3 [IU] via SUBCUTANEOUS
  Administered 2016-02-06: 11 [IU] via SUBCUTANEOUS
  Administered 2016-02-07: 08:00:00 3 [IU] via SUBCUTANEOUS
  Administered 2016-02-07: 8 [IU] via SUBCUTANEOUS
  Administered 2016-02-07: 2 [IU] via SUBCUTANEOUS
  Filled 2016-02-02: qty 3
  Filled 2016-02-02: qty 2
  Filled 2016-02-02: qty 5
  Filled 2016-02-02 (×2): qty 8
  Filled 2016-02-02: qty 3
  Filled 2016-02-02: qty 2
  Filled 2016-02-02: qty 3
  Filled 2016-02-02: qty 11
  Filled 2016-02-02: qty 5
  Filled 2016-02-02: qty 2
  Filled 2016-02-02 (×2): qty 8
  Filled 2016-02-02: qty 3
  Filled 2016-02-02: qty 5
  Filled 2016-02-02: qty 3
  Filled 2016-02-02: qty 11

## 2016-02-02 MED ORDER — INSULIN ASPART 100 UNIT/ML ~~LOC~~ SOLN
0.0000 [IU] | Freq: Every day | SUBCUTANEOUS | Status: DC
  Administered 2016-02-05 – 2016-02-06 (×2): 2 [IU] via SUBCUTANEOUS
  Filled 2016-02-02 (×2): qty 2

## 2016-02-02 NOTE — Progress Notes (Signed)
[place] INFECTIOUS DISEASE PROGRESS NOTE Date of Admission:  01/30/2016     ID: Sherry [name] is a 83 y.o. female with Active Problems:   Sepsis ([place])   Subjective: No fevers but wbc increasing. BP better - off pressors, more awak   ROS  Unable to obtain  Medications:  Antibiotics Given (last 72 hours)    Date/Time Action Medication Dose Rate   01/31/16 0304 Given   piperacillin-tazobactam (ZOSYN) IVPB 3.375 g 3.375 g 12.5 mL/hr   01/31/16 0305 Given   vancomycin (VANCOCIN) 500 mg in sodium chloride 0.9 % 100 mL IVPB 500 mg 100 mL/hr   01/31/16 1042 Given   piperacillin-tazobactam (ZOSYN) IVPB 3.375 g 3.375 g 12.5 mL/hr   01/31/16 1925 Given   cefTRIAXone (ROCEPHIN) IVPB 2 g 2 g 100 mL/hr   02/01/16 0925 Given   cefTRIAXone (ROCEPHIN) IVPB 2 g 2 g 100 mL/hr   02/02/16 [id number] Given   cefTRIAXone (ROCEPHIN) IVPB 2 g 2 g 100 mL/hr     . cefTRIAXone  2 g Intravenous Daily  . gabapentin  300 mg Oral QHS  . insulin aspart  0-15 Units Subcutaneous TID WC  . insulin aspart  0-5 Units Subcutaneous QHS  . insulin glargine  5 Units Subcutaneous QHS  . sodium chloride flush  3 mL Intravenous Q12H    Objective: Vital signs in last 24 hours: Temp:  [97.6 F (36.4 C)-98.7 F (37.1 C)] 97.7 F (36.5 C) (01/24 0800) Pulse Rate:  [90-99] 93 (01/24 1200) Resp:  [15-29] 25 (01/24 1200) BP: (94-141)/(47-74) 133/73 (01/24 1200) SpO2:  [90 %-96 %] 91 % (01/24 1200) Weight:  [87.3 kg (192 lb 7.4 oz)] 87.3 kg (192 lb 7.4 oz) (01/24 0358) Constitutional:  Frail, criticall ill appearing HENT: [place]/AT, PERRLA, no scleral icterus Mouth/Throat: Oropharynx is clear and dry. No oropharyngeal exudate.  Cardiovascular: Tachy Pulmonary/Chest: Effort normal and breath sounds normal. No respiratory distress.  has no wheezes.  Neck = supple, no nuchal rigidity Abdominal: Soft. Mild distention Lymphadenopathy: no cervical adenopathy. No axillary adenopathy Neurological:more awake and  interactive Skin: Skin is warm and dry. No rash noted. No erythema.  Psychiatric: lethargic  Lab Results  Recent Labs  02/01/16 0542 02/01/16 1408 02/02/16 0553 02/02/16 0726  WBC 17.9*  --   --  22.7*  HGB 10.8*  --   --  11.2*  HCT 32.6*  --   --  33.2*  NA 141 137 136  --   K 3.7 4.1 4.6  --   CL 110 108 104  --   CO2 17* 18* 20*  --   BUN 35* 35* 41*  --   CREATININE 2.67* 2.85* 3.12*  --     Microbiology: Results for orders placed or performed during the hospital encounter of 01/30/16  Urine culture     Status: Abnormal   Collection Time: 01/30/16  4:17 PM  Result Value Ref Range Status   Specimen Description URINE, RANDOM  Final   Special Requests NONE  Final   Culture >=100,000 COLONIES/mL KLEBSIELLA SPECIES (A)  Final   Report Status 02/02/2016 FINAL  Final   Organism ID, Bacteria KLEBSIELLA SPECIES (A)  Final      Susceptibility   Klebsiella species - MIC*    AMPICILLIN 16 RESISTANT Resistant     CEFAZOLIN <=4 SENSITIVE Sensitive     CEFTRIAXONE <=1 SENSITIVE Sensitive     CIPROFLOXACIN <=0.25 SENSITIVE Sensitive     GENTAMICIN <=1 SENSITIVE Sensitive  IMIPENEM <=0.25 SENSITIVE Sensitive     NITROFURANTOIN 64 INTERMEDIATE Intermediate     TRIMETH/SULFA <=20 SENSITIVE Sensitive     AMPICILLIN/SULBACTAM 4 SENSITIVE Sensitive     PIP/TAZO <=4 SENSITIVE Sensitive     Extended ESBL NEGATIVE Sensitive     * >=100,000 COLONIES/mL KLEBSIELLA SPECIES  Blood Culture (routine x 2)     Status: Abnormal   Collection Time: 01/30/16  5:21 PM  Result Value Ref Range Status   Specimen Description BLOOD RIGHT ANTECUBITAL  Final   Special Requests BOTTLES DRAWN AEROBIC AND ANAEROBIC 10CCAER,9CCANA  Final   Culture  Setup Time   Final    GRAM NEGATIVE RODS IN BOTH AEROBIC AND ANAEROBIC BOTTLES CRITICAL RESULT CALLED TO, READ BACK BY AND VERIFIED WITH: CHRISTINE KATSOUDAS ON 01/31/16 AT 1025 QSD Performed at [facility], [place] [address], [place], [place]  [address]    Culture PROTEUS MIRABILIS (A)  Final   Report Status 02/02/2016 FINAL  Final   Organism ID, Bacteria PROTEUS MIRABILIS  Final      Susceptibility   Proteus mirabilis - MIC*    AMPICILLIN <=2 SENSITIVE Sensitive     CEFAZOLIN <=4 SENSITIVE Sensitive     CEFEPIME <=1 SENSITIVE Sensitive     CEFTAZIDIME <=1 SENSITIVE Sensitive     CEFTRIAXONE <=1 SENSITIVE Sensitive     CIPROFLOXACIN >=4 RESISTANT Resistant     GENTAMICIN 8 INTERMEDIATE Intermediate     IMIPENEM 4 SENSITIVE Sensitive     TRIMETH/SULFA >=320 RESISTANT Resistant     AMPICILLIN/SULBACTAM <=2 SENSITIVE Sensitive     PIP/TAZO <=4 SENSITIVE Sensitive     * PROTEUS MIRABILIS  Blood Culture ID Panel (Reflexed)     Status: Abnormal   Collection Time: 01/30/16  5:21 PM  Result Value Ref Range Status   Enterococcus species NOT DETECTED NOT DETECTED Final   Listeria monocytogenes NOT DETECTED NOT DETECTED Final   Staphylococcus species NOT DETECTED NOT DETECTED Final   Staphylococcus aureus NOT DETECTED NOT DETECTED Final   Streptococcus species NOT DETECTED NOT DETECTED Final   Streptococcus agalactiae NOT DETECTED NOT DETECTED Final   Streptococcus pneumoniae NOT DETECTED NOT DETECTED Final   Streptococcus pyogenes NOT DETECTED NOT DETECTED Final   Acinetobacter baumannii NOT DETECTED NOT DETECTED Final   Enterobacteriaceae species DETECTED (A) NOT DETECTED Final    Comment: CRITICAL RESULT CALLED TO, READ BACK BY AND VERIFIED WITH: CHRISTINE KATSOUDAS ON 01/31/16 AT 1025 QSD    Enterobacter cloacae complex NOT DETECTED NOT DETECTED Final   Escherichia coli NOT DETECTED NOT DETECTED Final   Klebsiella oxytoca NOT DETECTED NOT DETECTED Final   Klebsiella pneumoniae NOT DETECTED NOT DETECTED Final   Proteus species DETECTED (A) NOT DETECTED Final    Comment: CRITICAL RESULT CALLED TO, READ BACK BY AND VERIFIED WITH: CHRISTINE KATSOUDAS ON 01/31/16 AT 1025 QSD    Serratia marcescens NOT DETECTED NOT DETECTED Final    Carbapenem resistance NOT DETECTED NOT DETECTED Final   Haemophilus influenzae NOT DETECTED NOT DETECTED Final   Neisseria meningitidis NOT DETECTED NOT DETECTED Final   Pseudomonas aeruginosa NOT DETECTED NOT DETECTED Final   Candida albicans NOT DETECTED NOT DETECTED Final   Candida glabrata NOT DETECTED NOT DETECTED Final   Candida krusei NOT DETECTED NOT DETECTED Final   Candida parapsilosis NOT DETECTED NOT DETECTED Final   Candida tropicalis NOT DETECTED NOT DETECTED Final  Blood Culture (routine x 2)     Status: Abnormal   Collection Time: 01/30/16  5:25  PM  Result Value Ref Range Status   Specimen Description BLOOD LEFT HAND  Final   Special Requests   Final    BOTTLES DRAWN AEROBIC AND ANAEROBIC 10CCAERO,10CCANA   Culture  Setup Time   Final    GRAM NEGATIVE RODS IN BOTH AEROBIC AND ANAEROBIC BOTTLES CRITICAL RESULT CALLED TO, READ BACK BY AND VERIFIED WITH: CHRISTINE KATSOUDAS ON 01/31/16 AT 1025 QSD    Culture (A)  Final    PROTEUS MIRABILIS SUSCEPTIBILITIES PERFORMED ON PREVIOUS CULTURE WITHIN THE LAST 5 DAYS. Performed at [facility], [place] [address] [place], [place] [address]    Report Status 02/02/2016 FINAL  Final  MRSA PCR Screening     Status: None   Collection Time: 01/31/16  5:47 AM  Result Value Ref Range Status   MRSA by PCR NEGATIVE NEGATIVE Final    Comment:        The GeneXpert MRSA Assay (FDA approved for NASAL specimens only), is one component of a comprehensive MRSA colonization surveillance program. It is not intended to diagnose MRSA infection nor to guide or monitor treatment for MRSA infections.   C difficile quick scan w PCR reflex     Status: None   Collection Time: 01/31/16  7:33 AM  Result Value Ref Range Status   C Diff antigen NEGATIVE NEGATIVE Final   C Diff toxin NEGATIVE NEGATIVE Final   C Diff interpretation No C. difficile detected.  Final    Studies/Results: No results found.  Assessment/Plan: Sherry [name] is a  83 y.o. female with proteus bacteremia of unknown source - UA unimpressive but UCX with > 100 K Klebsiella , CXR neg. Quite ill at this point. Remains on norepi.   Recommendations Continue ceftriaxone to which both were sensitive.  May need abd imaging if remains septic despite fluid and vasopressor support but would not add much at this point.   Thank you very much for the consult. Will follow with you.  Marveen Donlon P   02/02/2016, 1:39 PM

## 2016-02-02 NOTE — Progress Notes (Signed)
[facility] [place] NAME: Sherry [name]    MR#:  [id number]  DATE OF BIRTH:  Apr 08, 1927  SUBJECTIVE:  CHIEF COMPLAINT:   Chief Complaint  Patient presents with  . Altered Mental Status  . Diarrhea   Brought with c/o nausea and diarrhea- found to be in sepsis with bacteremia. Very critical and drowsy not able to give details. Renal func is gradually worse, still have urine output. BP improved, off levophed drip, and more alert and oriented today. Very weak. REVIEW OF SYSTEMS:   Review of Systems  Constitutional: Positive for malaise/fatigue. Negative for fever.  HENT: Negative for congestion, ear discharge, hearing loss, nosebleeds and sore throat.   Eyes: Negative for blurred vision, double vision and discharge.  Respiratory: Negative for cough, hemoptysis and shortness of breath.   Cardiovascular: Negative for chest pain, palpitations, orthopnea and leg swelling.  Gastrointestinal: Negative for abdominal pain, diarrhea, nausea and vomiting.  Genitourinary: Negative for dysuria and frequency.  Musculoskeletal: Negative for back pain, falls and myalgias.  Skin: Negative for rash.  Neurological: Positive for weakness. Negative for dizziness, tremors, focal weakness and headaches.  Psychiatric/Behavioral: Negative for depression.    DRUG ALLERGIES:  No Known Allergies  VITALS:  Blood pressure 139/60, pulse 95, temperature 97.6 F (36.4 C), temperature source Oral, resp. rate (!) 23, height 5\' 3"  (1.6 m), weight 87.3 kg (192 lb 7.4 oz), SpO2 91 %.  PHYSICAL EXAMINATION:  GENERAL:  83 y.o.-year-old patient lying in the bed with no acute distress. EYES: Pupils equal, round, reactive to light and accommodation. No scleral icterus. Extraocular muscles intact.  HEENT: Head atraumatic, normocephalic. Oropharynx and nasopharynx clear.  NECK:  Supple, no jugular venous distention. No thyroid enlargement, no tenderness.  LUNGS: Normal breath  sounds bilaterally, no wheezing, rales,rhonchi or crepitation. No use of accessory muscles of respiration.  CARDIOVASCULAR: S1, S2 normal. No murmurs, rubs, or gallops.  ABDOMEN: Soft, nontender, nondistended. Bowel sounds present. No organomegaly or mass. Foley in place,. EXTREMITIES: No pedal edema, cyanosis, or clubbing.  NEUROLOGIC: Follows commands, moves all 4 limbs power 4/5, gait not checked. No tremers. PSYCHIATRIC: The patient is alert and oriented.  SKIN: No obvious rash, lesion, or ulcer.   Physical Exam LABORATORY PANEL:   CBC  Recent Labs Lab 02/02/16 0726  WBC 22.7*  HGB 11.2*  HCT 33.2*  PLT 35*   ------------------------------------------------------------------------------------------------------------------  Chemistries   Recent Labs Lab 01/31/16 2051  02/01/16 0542  02/02/16 0553  NA 137  < > 141  < > 136  K 3.8  < > 3.7  < > 4.6  CL 110  < > 110  < > 104  CO2 16*  < > 17*  < > 20*  GLUCOSE 271*  < > 173*  < > 297*  BUN 35*  < > 35*  < > 41*  CREATININE 2.56*  < > 2.67*  < > 3.12*  CALCIUM 7.0*  < > 6.9*  < > 6.6*  MG 2.2  --   --   --   --   AST  --   --  100*  --   --   ALT  --   --  119*  --   --   ALKPHOS  --   --  132*  --   --   BILITOT  --   --  0.6  --   --   < > = values in this interval not  displayed. ------------------------------------------------------------------------------------------------------------------  Cardiac Enzymes  Recent Labs Lab 01/30/16 1720  TROPONINI 0.05*   ------------------------------------------------------------------------------------------------------------------  RADIOLOGY:  No results found.  ASSESSMENT AND PLAN:   Active Problems:   Sepsis ([place])   DNR (do not resuscitate)   Palliative care by specialist  * septic shock- gram negative bacteremia- proteus   Lactic acidosis      IV ceftriaxone, Levophed drip.   IV fluids   ID and CCM consult appreciated.   Flu and c diff negative.   No  PNA on Xray chest.   Prognosis extremely poor. Palliative care consult.   Off levophed drip now. Cont Abx as per ID physician.  * Ac on CKD stage 3   IV fluids and monitor Out put.  * Dm   Hold oral meds, keep NPO for now.   Finger stick.  * hypomagnesemia   Replace.  * metabolic encephalopathy    Due to septic shock. Improving npw.    * thrombocytopenia   Likely due to sepsis, monitor.   Don't give chemical DVT prophylaxis.    All the records are reviewed and case discussed with Care Management/Social Workerr. Management plans discussed with the patient, family and they are in agreement.  CODE STATUS: DNR  TOTAL TIME TAKING CARE OF THIS PATIENT: 40 minutes.   POSSIBLE D/C IN 2-3 DAYS, DEPENDING ON CLINICAL CONDITION.   Vaughan Basta M.D on 02/02/2016   Between 7am to 6pm - Pager - [phone]  After 6pm go to [email or web address] - password [email or web address] [facility]  Office  [phone]  CC: Primary care physician; [facility]  Note: This dictation was prepared with Dragon dictation along with smaller phrase technology. Any transcriptional errors that result from this process are unintentional.

## 2016-02-02 NOTE — Progress Notes (Signed)
[place] [place] for electrolytes Indication: insulin drip  No Known Allergies  Patient Measurements: Height: 5\' 3"  (160 cm) Weight: 192 lb 7.4 oz (87.3 kg) IBW/kg (Calculated) : 52.4  Vital Signs: Temp: 98.5 F (36.9 C) (01/24 0400) Temp Source: Axillary (01/24 0000) BP: 133/74 (01/24 0600) Pulse Rate: 99 (01/24 0600) Intake/Output from previous day: 01/23 0701 - 01/24 0700 In: 6549.9 [I.V.:5234.9; IV Piggyback:1315] Out: 650 [Urine:650] Intake/Output from this shift: No intake/output data recorded.  Labs:  Recent Labs  01/30/16 1720 01/30/16 2335 01/31/16 0312 01/31/16 0927  01/31/16 2051  02/01/16 0542 02/01/16 1408 02/02/16 0553  WBC  --   --  14.3*  --   --   --   --  17.9*  --   --   HGB  --   --  11.4*  --   --   --   --  10.8*  --   --   HCT  --   --  34.0*  --   --   --   --  32.6*  --   --   PLT  --   --  126*  --   --   --   --  54*  --   --   APTT  --  33 44*  --   --   --   --   --   --   --   CREATININE 1.51*  --  2.19* 2.23*  < > 2.56*  < > 2.67* 2.85* 3.12*  MG  --   --   --  1.4*  --  2.2  --   --   --   --   PHOS  --   --   --  2.6  --   --   --   --   --   --   ALBUMIN 3.2*  --  2.9*  --   --   --   --  2.4*  --   --   PROT 5.8*  --  5.7*  --   --   --   --  5.1*  --   --   AST 409*  --  170*  --   --   --   --  100*  --   --   ALT 194*  --  140*  --   --   --   --  119*  --   --   ALKPHOS 128*  --  129*  --   --   --   --  132*  --   --   BILITOT 0.7  --  0.3  --   --   --   --  0.6  --   --   < > = values in this interval not displayed. Estimated Creatinine Clearance: 13.6 mL/min (by C-G formula based on SCr of 3.12 mg/dL (H)).   Medical History: Past Medical History:  Diagnosis Date  . Diabetes mellitus without complication ([place])   . Hyperlipidemia   . Hypertension      Assessment: 83yo admitted 1/21 with AMS/diarrhea. Blood sugars have been high since admission around 400s. Will start patient on  insulin drip.   Goal of Therapy:  K 3.5-5  Plan:  K=4.6. No replacements needed at this time.  Patient off insulin drip at this time. Will continue to monitor and adjust per consult.   Loree Fee, PharmD 02/02/2016,7:23 AM

## 2016-02-02 NOTE — Consult Note (Signed)
PULMONARY / CRITICAL CARE MEDICINE   Name: Sherry [name] MRN: [id number] DOB: Oct 01, 1927    ADMISSION DATE:  01/30/2016   CONSULTATION DATE:  01/31/2016  REFERRING MD:  Dr. Orbie Pyo  CHIEF COMPLAINT:  Hypotension and AMS  HISTORY OF PRESENT ILLNESS:  Sherry [name] is an 83 year old Caucasian female with a past medical history of type 2 diabetes mellitus, hypertension and hyperlipidemia who presented to the ED via EMS with complaints of altered mental status and weakness. Her daughter called EMS because she found the patient in a recliner [place] in feces. Patient states that she hasn't been feeling well for the past two days. She reports one loose stool and one episode of nausea and vomiting. She is unable to characterize the nature of the emesis. Her blood sugar on the field was greater than 400 mg/dl. At the ED, patient was febrile with a temperature of 102.5 degrees F, tachycardic and tachypneic. A code sepsis was called.  She was admitted to the Hospitalist service and started on IV fluids and empiric antibiotics. Upon arrival on the floor, patient became more lethargic, and hypotensive. Her lactic acid level increased from 5.7 to 6.6 despite fluid resuscitation. PCCM was consulted to assume patient's management. She denies pain and difficulty breathing but reports feeling tired.     REVIEW OF SYSTEMS:   Alert and awake, lethargic but arousable  SUBJECTIVE:  Off vasopressors, alert and awake No acute issues Off vasopressors Clinically improved  VITAL SIGNS: BP 133/74   Pulse 99   Temp 98.5 F (36.9 C)   Resp (!) 24   Ht 5\' 3"  (1.6 m)   Wt 192 lb 7.4 oz (87.3 kg)   SpO2 93%   BMI 34.09 kg/m    INTAKE / OUTPUT: I/O last 3 completed shifts: In: 11692.9 [I.V.:10327.9; IV Piggyback:1365] Out: 650 [Urine:650]  PHYSICAL EXAMINATION: General: Acutely ill-looking Neuro: AAO X2, speech is normal,follows commands and moves all extremities HEENT: [place]/AT, PERRLA, Oral mucosa dry, trachea  midline Cardiovascular: RRR, S1/S2, no MRG, +2 pulses bilaterally, no edema Lungs: increased WOB, CTAB, diminished in the bases Abdomen:  Non-distended, normal bowel sounds Musculoskeletal:  No joint swelling, +rom in UE/LE Skin: Cold, no rash or lesions  LABS:  BMET  Recent Labs Lab 02/01/16 0542 02/01/16 1408 02/02/16 0553  NA 141 137 136  K 3.7 4.1 4.6  CL 110 108 104  CO2 17* 18* 20*  BUN 35* 35* 41*  CREATININE 2.67* 2.85* 3.12*  GLUCOSE 173* 145* 297*    Electrolytes  Recent Labs Lab 01/31/16 0927  01/31/16 2051  02/01/16 0542 02/01/16 1408 02/02/16 0553  CALCIUM 7.3*  < > 7.0*  < > 6.9* 6.4* 6.6*  MG 1.4*  --  2.2  --   --   --   --   PHOS 2.6  --   --   --   --   --   --   < > = values in this interval not displayed.  CBC  Recent Labs Lab 01/31/16 0312 02/01/16 0542 02/02/16 0726  WBC 14.3* 17.9* 22.7*  HGB 11.4* 10.8* 11.2*  HCT 34.0* 32.6* 33.2*  PLT 126* 54* 35*    Coag's  Recent Labs Lab 01/30/16 2335 01/31/16 0312  APTT 33 44*  INR 1.10 1.19    Sepsis Markers  Recent Labs Lab 01/30/16 2335  01/31/16 0631 01/31/16 0927 02/01/16 0807  LATICACIDVEN 5.7*  < > 3.5* 4.8* 3.3*  PROCALCITON >200.00  --   --   --   --   < > =  values in this interval not displayed.  ABG  Recent Labs Lab 01/31/16 1615  PHART 7.28*  PCO2ART 37  PO2ART 66*    Liver Enzymes  Recent Labs Lab 01/30/16 1720 01/31/16 0312 02/01/16 0542  AST 409* 170* 100*  ALT 194* 140* 119*  ALKPHOS 128* 129* 132*  BILITOT 0.7 0.3 0.6  ALBUMIN 3.2* 2.9* 2.4*    Cardiac Enzymes  Recent Labs Lab 01/30/16 1720  TROPONINI 0.05*    Glucose  Recent Labs Lab 02/01/16 1312 02/01/16 1402 02/01/16 1503 02/01/16 1740 02/01/16 2120 02/02/16 0720  GLUCAP 141* 144* 158* 170* 206* 289*    Imaging No results found.  STUDIES:  None  CULTURES: Blood cultures x 2 01/22>proteus Urine culture 01/22>gram neg  rods  ANTIBIOTICS: Rocephin>>   SIGNIFICANT EVENTS: 01/21>ED with sepsis of unknown source  LINES/TUBES: PIVs  DISCUSSION: 83 y/o WF presenting with septic shock, sepsis of unknown origin, severe hyperglycemia and gastroenteritis proteus bacteremia-slowly improving  ASSESSMENT / PLAN:  PULMONARY A: Lactic and metabolic acidosis Hypoxia secondary to sepsis P:   Supplemental O2 prn  CARDIOVASCULAR A:  Septic shock with superimpose hypovolemic shock 2/2 diarrhea/vomiting H/o hypertension H/o Hyperlipidemia P:  IV fluid resuscitation Norepinephrine infusion; titrate to keep MAP>65 as needed Hold all antihypertensives   RENAL A:   Acute on chronic renal failure-baseline creatinine 1.1; now 2.2. Hypomagnesema P:   Trend creatinine IV fluids Monitor and replace electrolytes Will consider nephology consult if kidney function continues to decline  GASTROINTESTINAL A:   Acute gastroenteritis-Viral versus bacteria Advance as tolerated -follow up c diff  INFECTIOUS A:   Fever and leukocytosis P:   proteus bacteremia Follow up ucx  ENDOCRINE A:   Severe hyperglycemia   P:   ICU glycemic control protocol   NEUROLOGIC A:   Acute metabolic encephalopathy 2/2 sepsis and septic shock P:   RASS goal: n/a Monitor neurologic status Treat for for sepsis/septic shock as above  I have personally obtained a history, examined the patient, evaluated Pertinent laboratory and [employer]/imaging results, and  formulated the assessment and plan   The Patient requires high complexity decision making for assessment and support, frequent evaluation and titration of therapies, application of advanced monitoring technologies and extensive interpretation of multiple databases.    Corrin Parker, M.D.  [facility]  Medical Director [place] Director [facility] Cardio-Pulmonary Department

## 2016-02-02 NOTE — Progress Notes (Signed)
[place] at [place] NAME: Sherry [name]    MR#:  [id number]  DATE OF BIRTH:  1927/05/08  SUBJECTIVE:  CHIEF COMPLAINT:   Chief Complaint  Patient presents with  . Altered Mental Status  . Diarrhea   Brought with c/o nausea and diarrhea- found to be in sepsis with bacteremia. Very critical and drowsy not able to give details. Renal func is gradually worse, still have urine output. REVIEW OF SYSTEMS:  Pt is drowsy and not able to give much details. ROS  DRUG ALLERGIES:  No Known Allergies  VITALS:  Blood pressure 133/74, pulse 99, temperature 98.5 F (36.9 C), resp. rate (!) 24, height 5\' 3"  (1.6 m), weight 87.3 kg (192 lb 7.4 oz), SpO2 93 %.  PHYSICAL EXAMINATION:  GENERAL:  83 y.o.-year-old patient lying in the bed with acute distress.drowsy, critical appearing.  EYES: Pupils equal, round, reactive to light and accommodation. No scleral icterus. Extraocular muscles intact.  HEENT: Head atraumatic, normocephalic. Oropharynx and nasopharynx clear.  NECK:  Supple, no jugular venous distention. No thyroid enlargement, no tenderness.  LUNGS: Normal breath sounds bilaterally, no wheezing, rales,rhonchi or crepitation. No use of accessory muscles of respiration.  CARDIOVASCULAR: S1, S2 normal. No murmurs, rubs, or gallops.  ABDOMEN: Soft, nontender, nondistended. Bowel sounds present. No organomegaly or mass. Foley in place,. EXTREMITIES: No pedal edema, cyanosis, or clubbing.  NEUROLOGIC: Pt is drowsy and opens eyes to stimuli, and moans few words, but does not communicate. PSYCHIATRIC: The patient is drowsy.  SKIN: No obvious rash, lesion, or ulcer.   Physical Exam LABORATORY PANEL:   CBC  Recent Labs Lab 02/02/16 0726  WBC 22.7*  HGB 11.2*  HCT 33.2*  PLT 35*   ------------------------------------------------------------------------------------------------------------------  Chemistries   Recent Labs Lab 01/31/16 2051   02/01/16 0542  02/02/16 0553  NA 137  < > 141  < > 136  K 3.8  < > 3.7  < > 4.6  CL 110  < > 110  < > 104  CO2 16*  < > 17*  < > 20*  GLUCOSE 271*  < > 173*  < > 297*  BUN 35*  < > 35*  < > 41*  CREATININE 2.56*  < > 2.67*  < > 3.12*  CALCIUM 7.0*  < > 6.9*  < > 6.6*  MG 2.2  --   --   --   --   AST  --   --  100*  --   --   ALT  --   --  119*  --   --   ALKPHOS  --   --  132*  --   --   BILITOT  --   --  0.6  --   --   < > = values in this interval not displayed. ------------------------------------------------------------------------------------------------------------------  Cardiac Enzymes  Recent Labs Lab 01/30/16 1720  TROPONINI 0.05*   ------------------------------------------------------------------------------------------------------------------  RADIOLOGY:  No results found.  ASSESSMENT AND PLAN:   Active Problems:   Sepsis ([place])  * septic shock- gram negative bacteremia- proteus   Lactic acidosis      IV ceftriaxone, Levophed drip.   IV fluids   ID and CCM consult appreciated.   Flu and c diff negative.   No PNA on Xray chest.   Prognosis extremely poor. Palliative care consult.  * Ac on CKD stage 3   IV fluids and monitor Out put.  * Dm   Hold oral  meds, keep NPO for now.   Finger stick.  * hypomagnesemia   Replace.  * metabolic encephalopathy    Due to septic shock.    * thrombocytopenia   Likely due to sepsis, monitor.   Don't give chemical DVT prophylaxis.    All the records are reviewed and case discussed with Care Management/Social Workerr. Management plans discussed with the patient, family and they are in agreement.  CODE STATUS: DNR  TOTAL TIME TAKING CARE OF THIS PATIENT: 40 critical care minutes.   POSSIBLE D/C IN 2-3 DAYS, DEPENDING ON CLINICAL CONDITION.   Vaughan Basta M.D on 02/02/2016   Between 7am to 6pm - Pager - [phone]  After 6pm go to [email or web address] - password [email or web address]  [facility]  Office  [phone]  CC: Primary care physician; [facility]  Note: This dictation was prepared with Dragon dictation along with smaller phrase technology. Any transcriptional errors that result from this process are unintentional.

## 2016-02-02 NOTE — Progress Notes (Signed)
Inpatient Diabetes Program Recommendations  AACE/ADA: New Consensus Statement on Inpatient Glycemic Control (2015)  Target Ranges:  Prepandial:   less than 140 mg/dL      Peak postprandial:   less than 180 mg/dL (1-2 hours)      Critically ill patients:  140 - 180 mg/dL   Results for ANNALIZE, CLEGHORN (MRN [id number]) as of 02/02/2016 11:39  Ref. Range 02/01/2016 07:56 02/01/2016 08:58 02/01/2016 09:54 02/01/2016 10:59 02/01/2016 12:08 02/01/2016 13:12 02/01/2016 14:02 02/01/2016 15:03 02/01/2016 17:40 02/01/2016 21:20 02/02/2016 07:20  Glucose-Capillary Latest Ref Range: 65 - 99 mg/dL 130 (H) 118 (H) 128 (H) 125 (H) 166 (H) 141 (H)  Lantus 5 units @ 13:25 144 (H) 158 (H)  Insulin drip stopped 170 (H)  Novolog 2 units 206 (H)  Novolog 2 units 289 (H)  Novolog 5 units   Review of Glycemic Control  Diabetes history: DM2 Outpatient Diabetes medications: Glipizide 10 mg daily, Metformin 1000 mg BID Current orders for Inpatient glycemic control: Lantus 5 units QHS, Novolog 0-15 units TID with meals, Novolog 0-5 units QHS  Inpatient Diabetes Program Recommendations: Insulin - Basal: Please consider increasing Lantus to 10 units QHS. Correction (SSI): Due to poor PO intake and lethargy, please consider changing frequency of CBGs and Novolog to Q4H.  Thanks, Barnie Alderman, RN, MSN, CDE Diabetes Coordinator Inpatient Diabetes Program [phone] (Team Pager from 8am to 5pm)

## 2016-02-03 DIAGNOSIS — R627 Adult failure to thrive: Secondary | ICD-10-CM

## 2016-02-03 LAB — CBC
HCT: 36.7 % (ref 35.0–47.0)
Hemoglobin: 12.5 g/dL (ref 12.0–16.0)
MCH: 29 pg (ref 26.0–34.0)
MCHC: 34.1 g/dL (ref 32.0–36.0)
MCV: 85 fL (ref 80.0–100.0)
PLATELETS: 27 10*3/uL — AB (ref 150–440)
RBC: 4.31 MIL/uL (ref 3.80–5.20)
RDW: 13.7 % (ref 11.5–14.5)
WBC: 27.5 10*3/uL — ABNORMAL HIGH (ref 3.6–11.0)

## 2016-02-03 LAB — GLUCOSE, CAPILLARY
GLUCOSE-CAPILLARY: 265 mg/dL — AB (ref 65–99)
GLUCOSE-CAPILLARY: 268 mg/dL — AB (ref 65–99)
GLUCOSE-CAPILLARY: 157 mg/dL — AB (ref 65–99)
Glucose-Capillary: 142 mg/dL — ABNORMAL HIGH (ref 65–99)
Glucose-Capillary: 158 mg/dL — ABNORMAL HIGH (ref 65–99)

## 2016-02-03 LAB — BASIC METABOLIC PANEL
Anion gap: 13 (ref 5–15)
BUN: 52 mg/dL — AB (ref 6–20)
CO2: 24 mmol/L (ref 22–32)
CREATININE: 3.1 mg/dL — AB (ref 0.44–1.00)
Calcium: 7.1 mg/dL — ABNORMAL LOW (ref 8.9–10.3)
Chloride: 103 mmol/L (ref 101–111)
GFR, EST AFRICAN AMERICAN: 15 mL/min — AB (ref >60)
GFR, EST NON AFRICAN AMERICAN: 13 mL/min — AB (ref >60)
Glucose, Bld: 284 mg/dL — ABNORMAL HIGH (ref 65–99)
Potassium: 3.5 mmol/L (ref 3.5–5.1)
SODIUM: 140 mmol/L (ref 135–145)

## 2016-02-03 MED ORDER — INSULIN GLARGINE 100 UNIT/ML ~~LOC~~ SOLN
7.0000 [IU] | Freq: Every day | SUBCUTANEOUS | Status: DC
  Administered 2016-02-03: 22:00:00 7 [IU] via SUBCUTANEOUS
  Filled 2016-02-03 (×2): qty 0.07

## 2016-02-03 NOTE — Progress Notes (Signed)
[place] at [place] NAME: Sherry [name]    MR#:  [id number]  DATE OF BIRTH:  Feb 13, 1927  SUBJECTIVE:  CHIEF COMPLAINT:   Chief Complaint  Patient presents with  . Altered Mental Status  . Diarrhea   Brought with c/o nausea and diarrhea- found to be in sepsis with bacteremia. Very critical and drowsy not able to give details. Renal func is gradually worse, still have urine output. BP improved, off levophed drip, and more alert and oriented today. Very weak. Started eating. REVIEW OF SYSTEMS:   Review of Systems  Constitutional: Positive for malaise/fatigue. Negative for fever.  HENT: Negative for congestion, ear discharge, hearing loss, nosebleeds and sore throat.   Eyes: Negative for blurred vision, double vision and discharge.  Respiratory: Negative for cough, hemoptysis and shortness of breath.   Cardiovascular: Negative for chest pain, palpitations, orthopnea and leg swelling.  Gastrointestinal: Negative for abdominal pain, diarrhea, nausea and vomiting.  Genitourinary: Negative for dysuria and frequency.  Musculoskeletal: Negative for back pain, falls and myalgias.  Skin: Negative for rash.  Neurological: Positive for weakness. Negative for dizziness, tremors, focal weakness and headaches.  Psychiatric/Behavioral: Negative for depression.    DRUG ALLERGIES:  No Known Allergies  VITALS:  Blood pressure (!) 141/57, pulse 81, temperature 97.4 F (36.3 C), temperature source Oral, resp. rate 20, height 5\' 4"  (1.626 m), weight 87.8 kg (193 lb 8 oz), SpO2 95 %.  PHYSICAL EXAMINATION:  GENERAL:  83 y.o.-year-old patient lying in the bed with no acute distress. EYES: Pupils equal, round, reactive to light and accommodation. No scleral icterus. Extraocular muscles intact.  HEENT: Head atraumatic, normocephalic. Oropharynx and nasopharynx clear.  NECK:  Supple, no jugular venous distention. No thyroid enlargement, no tenderness.  LUNGS:  Normal breath sounds bilaterally, no wheezing, rales,rhonchi or crepitation. No use of accessory muscles of respiration.  CARDIOVASCULAR: S1, S2 normal. No murmurs, rubs, or gallops.  ABDOMEN: Soft, nontender, nondistended. Bowel sounds present. No organomegaly or mass. Foley in place,. EXTREMITIES: No pedal edema, cyanosis, or clubbing.  NEUROLOGIC: Follows commands, moves all 4 limbs power 4/5, gait not checked. No tremers. PSYCHIATRIC: The patient is alert and oriented.  SKIN: No obvious rash, lesion, or ulcer.   Physical Exam LABORATORY PANEL:   CBC  Recent Labs Lab 02/03/16 0407  WBC 27.5*  HGB 12.5  HCT 36.7  PLT 27*   ------------------------------------------------------------------------------------------------------------------  Chemistries   Recent Labs Lab 01/31/16 2051  02/01/16 0542  02/03/16 0407  NA 137  < > 141  < > 140  K 3.8  < > 3.7  < > 3.5  CL 110  < > 110  < > 103  CO2 16*  < > 17*  < > 24  GLUCOSE 271*  < > 173*  < > 284*  BUN 35*  < > 35*  < > 52*  CREATININE 2.56*  < > 2.67*  < > 3.10*  CALCIUM 7.0*  < > 6.9*  < > 7.1*  MG 2.2  --   --   --   --   AST  --   --  100*  --   --   ALT  --   --  119*  --   --   ALKPHOS  --   --  132*  --   --   BILITOT  --   --  0.6  --   --   < > = values in this  interval not displayed. ------------------------------------------------------------------------------------------------------------------  Cardiac Enzymes  Recent Labs Lab 01/30/16 1720  TROPONINI 0.05*   ------------------------------------------------------------------------------------------------------------------  RADIOLOGY:  No results found.  ASSESSMENT AND PLAN:   Active Problems:   Sepsis ([place])   DNR (do not resuscitate)   Palliative care by specialist  * septic shock- gram negative bacteremia- proteus   Lactic acidosis      IV ceftriaxone, Levophed drip.   IV fluids   ID and CCM consult appreciated.   Flu and c diff  negative.   No PNA on Xray chest.   Prognosis extremely poor. Palliative care consult.   Off levophed drip now. Cont Abx as per ID physician.   Will transfer to floor and get PT eval for discharge planning.  * Ac on CKD stage 3   IV fluids and monitor Out put.  * Dm   Hold oral meds.   Finger stick.   On small dose lantus now.  * hypomagnesemia   Replace.  * metabolic encephalopathy    Due to septic shock. Improving npw.    * thrombocytopenia   Likely due to sepsis, monitor.   Don't give chemical DVT prophylaxis.    All the records are reviewed and case discussed with Care Management/Social Workerr. Management plans discussed with the patient, family and they are in agreement.  CODE STATUS: DNR  TOTAL TIME TAKING CARE OF THIS PATIENT: 40 minutes.   POSSIBLE D/C IN 2-3 DAYS, DEPENDING ON CLINICAL CONDITION.   Vaughan Basta M.D on 02/03/2016   Between 7am to 6pm - Pager - [phone]  After 6pm go to [email or web address] - password [email or web address] [place] [facility]  Office  [phone]  CC: Primary care physician; [facility]  Note: This dictation was prepared with Dragon dictation along with smaller phrase technology. Any transcriptional errors that result from this process are unintentional.

## 2016-02-03 NOTE — Progress Notes (Signed)
Inpatient Diabetes Program Recommendations  AACE/ADA: New Consensus Statement on Inpatient Glycemic Control (2015)  Target Ranges:  Prepandial:   less than 140 mg/dL      Peak postprandial:   less than 180 mg/dL (1-2 hours)      Critically ill patients:  140 - 180 mg/dL   Lab Results  Component Value Date   GLUCAP 265 (H) 02/03/2016   HGBA1C 5.8 11/26/2014    Review of Glycemic Control  Results for Sherry [name], Sherry [name] (MRN [id number]) as of 02/03/2016 10:42  Ref. Range 02/02/2016 07:20 02/02/2016 11:47 02/02/2016 17:09 02/02/2016 21:32 02/03/2016 07:30  Glucose-Capillary Latest Ref Range: 65 - 99 mg/dL 289 (H) 306 (H) 190 (H) 174 (H) 265 (H)    Diabetes history: DM2 Outpatient Diabetes medications: Glipizide 10 mg daily, Metformin 1000 mg BID  Current orders for Inpatient glycemic control: Lantus 5 units QHS, Novolog 0-15 units TID with meals, Novolog 0-5 units QHS  Inpatient Diabetes Program Recommendations: Insulin - Basal: Please consider increasing Lantus to 10 units QHS.  Correction (SSI): Due to poor PO intake and lethargy, please consider changing frequency of CBGs and Novolog to Q4H.  Gentry Fitz, RN, BA, MHA, CDE Diabetes Coordinator Inpatient Diabetes Program  [phone] (Team Pager) [phone] ([place]) 02/03/2016 10:43 AM

## 2016-02-03 NOTE — Progress Notes (Signed)
[place] NOTE   Pharmacy Consult for electrolytes Indication: insulin drip  No Known Allergies  Patient Measurements: Height: 5\' 3"  (160 cm) Weight: 192 lb 7.4 oz (87.3 kg) IBW/kg (Calculated) : 52.4  Vital Signs: Temp: 98.2 F (36.8 C) (01/25 0814) Temp Source: Axillary (01/25 0814) BP: 127/59 (01/25 1000) Pulse Rate: 86 (01/25 1000) Intake/Output from previous day: 01/24 0701 - 01/25 0700 In: 686.7 [I.V.:636.7; IV Piggyback:50] Out: 725 [Urine:725] Intake/Output from this shift: Total I/O In: 50 [IV Piggyback:50] Out: -   Labs:  Recent Labs  01/31/16 2051  02/01/16 0542 02/01/16 1408 02/02/16 0553 02/02/16 0726 02/03/16 0407  WBC  --   --  17.9*  --   --  22.7* 27.5*  HGB  --   --  10.8*  --   --  11.2* 12.5  HCT  --   --  32.6*  --   --  33.2* 36.7  PLT  --   --  54*  --   --  35* 27*  CREATININE 2.56*  < > 2.67* 2.85* 3.12*  --  3.10*  MG 2.2  --   --   --   --   --   --   ALBUMIN  --   --  2.4*  --   --   --   --   PROT  --   --  5.1*  --   --   --   --   AST  --   --  100*  --   --   --   --   ALT  --   --  119*  --   --   --   --   ALKPHOS  --   --  132*  --   --   --   --   BILITOT  --   --  0.6  --   --   --   --   < > = values in this interval not displayed. Estimated Creatinine Clearance: 13.7 mL/min (by C-G formula based on SCr of 3.1 mg/dL (H)).   Medical History: Past Medical History:  Diagnosis Date  . Diabetes mellitus without complication ([place])   . Hyperlipidemia   . Hypertension      Assessment: 83yo admitted 1/21 with AMS/diarrhea. Blood sugars have been high since admission around 400s. Will start patient on insulin drip.   Goal of Therapy:  K 3.5-5  Plan:  K=3.5. No replacements needed at this time.  Patient off insulin drip at this time. Will continue to monitor and adjust per consult.   Loree Fee, PharmD 02/03/2016,11:28 AM

## 2016-02-03 NOTE — Progress Notes (Signed)
Notifed Dr. Mortimer Fries that patient is more alert- though confused.  Blood sugars are still ranging in the upper 200's- Patient is on 5 units of lantus. Patient only took a bite of food- Creatinine and WBC are elevated.  Platelets are 27.  Dr. Mortimer Fries stated he would change lantus to 7 units and continue to monitor patient.

## 2016-02-03 NOTE — Progress Notes (Signed)
Physical Therapy Treatment Patient Details Name: NIJAY MCAMIS MRN: [id number] DOB: 06-11-1927 Today's Date: 02/03/2016    History of Present Illness Pt is a 83 y/o F who was found by daughter to have AMS sitting in her own diarrhea.  Her blood sugar was greater than 400.  A code sepsis was called, pt febrile, tachycardic, tachypneic, and temperature 102.5.  Once on the floor pt became hypotensive and lethargic.  Pt's PMH includes back surgery, shoulder surgery.    PT Comments    Ms. Orrick made good progress today and tolerated sitting EOB ~4 minutes.  She requires max +2 assist for bed mobility but demonstrated ability to initiate movement and participate in mobility.  She fatigues after sitting EOB and performing BLE LAQ and ipsilateral and contralateral BUE reaching with min guard>mod assist to remain steady.  Pt will benefit from continued skilled PT services to increase functional independence and safety.   Follow Up Recommendations  SNF     Equipment Recommendations  Other (comment) (TBD at next venue of care)    Recommendations for Other Services OT consult     Precautions / Restrictions Precautions Precautions: Fall;Other (comment) Precaution Comments: monitor vitals Restrictions Weight Bearing Restrictions: No    Mobility  Bed Mobility Overal bed mobility: Needs Assistance Bed Mobility: Supine to Sit;Sit to Supine     Supine to sit: Max assist;+2 for physical assistance;HOB elevated Sit to supine: Max assist;+2 for physical assistance   General bed mobility comments: Assist to elevate trunk and manage LEs.  Pt initiates supine>sit.  Use of bed pad to scoot pt to EOB.    Transfers                 General transfer comment: Uanble to attempt at this time  Ambulation/Gait                 Stairs            Wheelchair Mobility    Modified Rankin (Stroke Patients Only)       Balance Overall balance assessment: Needs  assistance Sitting-balance support: Single extremity supported;Feet supported Sitting balance-Leahy Scale: Poor Sitting balance - Comments: At least 1 UE supported while seated EOB and min guard>mod assist  Postural control: Posterior lean                          Cognition Arousal/Alertness: Lethargic Behavior During Therapy: WFL for tasks assessed/performed Overall Cognitive Status: Impaired/Different from baseline Area of Impairment: Orientation Orientation Level: Disoriented to;Time;Situation             General Comments: Pt very lethargic, difficulty to thoroughly assess cognition    Exercises Other Exercises Other Exercises: Pt sat EOB ~4 minutes and performed 4 LAQs (not resisted) and contralateral and ipsilateral reaching with each UE x3 each to activate abdominals.  Pt fatigues and requires min guard>mod asisst to remain stable.    General Comments General comments (skin integrity, edema, etc.): SpO2 dropped briefly to 82% on  while sitting EOB which improved to mid 90s quickly with pursed lip breathing.  All other VSS.      Pertinent Vitals/Pain Pain Assessment: No/denies pain (no grimacing or signs of pain)    Home Living                      Prior Function            PT Goals (current goals can now  be found in the care plan section) Acute Rehab PT Goals Patient Stated Goal: None stated.  Pt nods head in agreement to attempt bed mobility. PT Goal Formulation: With family Time For Goal Achievement: 02/14/16 Potential to Achieve Goals: Fair Progress towards PT goals: Progressing toward goals    Frequency    Min 2X/week      PT Plan Current plan remains appropriate    Co-evaluation             End of Session Equipment Utilized During Treatment: Oxygen Activity Tolerance: Patient limited by lethargy;Patient limited by fatigue Patient left: in bed;with call bell/phone within reach;with bed alarm set;with SCD's reapplied;Other  (comment) (in chair position)     Time: 1202-1221 PT Time Calculation (min) (ACUTE ONLY): 19 min  Charges:  $Therapeutic Activity: 8-22 mins                    G Codes:       Collie Siad PT, DPT 02/03/2016, 1:29 PM

## 2016-02-03 NOTE — Progress Notes (Signed)
Notified Dr. Anselm Jungling that lab was only able to received 1 set of blood cultures.  Patient was stuck multiple times.  He stated that was ok and that patient may transfer to any med surgical- no telemetry.

## 2016-02-03 NOTE — Progress Notes (Signed)
  Call to patient's daughter from bedsdie to update on current medical situation.  Patient is lethargic, taking a few sips and chips and is improved to transition  to med-surg floor today.       Family is open to all offered and available medical interventions to prolong life, hopeful for return to baseline.   Ultimate goal is comfort and quality of life.   For now watchful waiting.   Questions and concerns addressed.  I shared my concern for high risk of decompensation and expressed the importance of making decisions dependant on patient's response to treatment over the next 24-48 hrs.    Family continue to face advanced directive decisions and anticipatory care needs.  Will need guidance in navigating choices and options.     PMT will continue to support holistically.   Wadie Lessen NP  Palliative Medicine Team Team Phone # [phone] Pager [phone]  Total time spent on the unit was 20 minutes.  Time in 1230 time out 1250 Greater than 50% of the time was spent in counseling and coordination of care.

## 2016-02-04 LAB — BASIC METABOLIC PANEL
ANION GAP: 12 (ref 5–15)
BUN: 61 mg/dL — ABNORMAL HIGH (ref 6–20)
CALCIUM: 7.5 mg/dL — AB (ref 8.9–10.3)
CO2: 22 mmol/L (ref 22–32)
Chloride: 106 mmol/L (ref 101–111)
Creatinine, Ser: 2.91 mg/dL — ABNORMAL HIGH (ref 0.44–1.00)
GFR calc Af Amer: 16 mL/min — ABNORMAL LOW (ref >60)
GFR, EST NON AFRICAN AMERICAN: 14 mL/min — AB (ref >60)
GLUCOSE: 197 mg/dL — AB (ref 65–99)
POTASSIUM: 3.7 mmol/L (ref 3.5–5.1)
Sodium: 140 mmol/L (ref 135–145)

## 2016-02-04 LAB — GLUCOSE, CAPILLARY
GLUCOSE-CAPILLARY: 211 mg/dL — AB (ref 65–99)
Glucose-Capillary: 237 mg/dL — ABNORMAL HIGH (ref 65–99)
Glucose-Capillary: 143 mg/dL — ABNORMAL HIGH (ref 65–99)
Glucose-Capillary: 123 mg/dL — ABNORMAL HIGH (ref 65–99)

## 2016-02-04 MED ORDER — INSULIN GLARGINE 100 UNIT/ML ~~LOC~~ SOLN
9.0000 [IU] | Freq: Every day | SUBCUTANEOUS | Status: DC
  Administered 2016-02-05: 9 [IU] via SUBCUTANEOUS
  Filled 2016-02-04 (×3): qty 0.09

## 2016-02-04 MED ORDER — ENSURE ENLIVE PO LIQD
237.0000 mL | Freq: Three times a day (TID) | ORAL | Status: DC
  Administered 2016-02-04 – 2016-02-06 (×5): 237 mL via ORAL

## 2016-02-04 NOTE — Progress Notes (Signed)
[place] at [place] NAME: Sherry [name]    MR#:  [id number]  DATE OF BIRTH:  [birth year]  SUBJECTIVE:  CHIEF COMPLAINT:   Chief Complaint  Patient presents with  . Altered Mental Status  . Diarrhea   Brought with c/o nausea and diarrhea- found to be in sepsis with bacteremia. Very critical and drowsy not able to give details. Renal func is gradually worse, still have urine output. BP improved, off levophed drip, and more alert and oriented today. Very weak. Started eating but not much. REVIEW OF SYSTEMS:   Review of Systems  Constitutional: Positive for malaise/fatigue. Negative for fever.  HENT: Negative for congestion, ear discharge, hearing loss, nosebleeds and sore throat.   Eyes: Negative for blurred vision, double vision and discharge.  Respiratory: Negative for cough, hemoptysis and shortness of breath.   Cardiovascular: Negative for chest pain, palpitations, orthopnea and leg swelling.  Gastrointestinal: Negative for abdominal pain, diarrhea, nausea and vomiting.  Genitourinary: Negative for dysuria and frequency.  Musculoskeletal: Negative for back pain, falls and myalgias.  Skin: Negative for rash.  Neurological: Positive for weakness. Negative for dizziness, tremors, focal weakness and headaches.  Psychiatric/Behavioral: Negative for depression.    DRUG ALLERGIES:  No Known Allergies  VITALS:  Blood pressure (!) 136/52, pulse 72, temperature 98.2 F (36.8 C), temperature source Oral, resp. rate 17, height 5\' 4"  (1.626 m), weight 87.8 kg (193 lb 8 oz), SpO2 93 %.  PHYSICAL EXAMINATION:  GENERAL:  83 y.o.-year-old patient lying in the bed with no acute distress. EYES: Pupils equal, round, reactive to light and accommodation. No scleral icterus. Extraocular muscles intact.  HEENT: Head atraumatic, normocephalic. Oropharynx and nasopharynx clear.  NECK:  Supple, no jugular venous distention. No thyroid enlargement, no tenderness.   LUNGS: Normal breath sounds bilaterally, no wheezing, rales,rhonchi or crepitation. No use of accessory muscles of respiration.  CARDIOVASCULAR: S1, S2 normal. No murmurs, rubs, or gallops.  ABDOMEN: Soft, nontender, nondistended. Bowel sounds present. No organomegaly or mass. Foley in place,. EXTREMITIES: No pedal edema, cyanosis, or clubbing.  NEUROLOGIC: Follows commands, moves all 4 limbs power 4/5, gait not checked. No tremers. PSYCHIATRIC: The patient is alert and oriented.  SKIN: No obvious rash, lesion, or ulcer.   Physical Exam LABORATORY PANEL:   CBC  Recent Labs Lab 02/03/16 0407  WBC 27.5*  HGB 12.5  HCT 36.7  PLT 27*   ------------------------------------------------------------------------------------------------------------------  Chemistries   Recent Labs Lab 01/31/16 2051  02/01/16 0542  02/04/16 0413  NA 137  < > 141  < > 140  K 3.8  < > 3.7  < > 3.7  CL 110  < > 110  < > 106  CO2 16*  < > 17*  < > 22  GLUCOSE 271*  < > 173*  < > 197*  BUN 35*  < > 35*  < > 61*  CREATININE 2.56*  < > 2.67*  < > 2.91*  CALCIUM 7.0*  < > 6.9*  < > 7.5*  MG 2.2  --   --   --   --   AST  --   --  100*  --   --   ALT  --   --  119*  --   --   ALKPHOS  --   --  132*  --   --   BILITOT  --   --  0.6  --   --   < > =  values in this interval not displayed. ------------------------------------------------------------------------------------------------------------------  Cardiac Enzymes  Recent Labs Lab 01/30/16 1720  TROPONINI 0.05*   ------------------------------------------------------------------------------------------------------------------  RADIOLOGY:  No results found.  ASSESSMENT AND PLAN:   Active Problems:   Sepsis ([place])   DNR (do not resuscitate)   Palliative care by specialist   Adult failure to thrive syndrome  * septic shock- gram negative bacteremia- proteus   Lactic acidosis      IV ceftriaxone, Levophed drip.   IV fluids   ID and CCM  consult appreciated.   Flu and c diff negative.   No PNA on Xray chest.   Prognosis extremely poor. Palliative care consult.   Off levophed drip now. Cont Abx as per ID physician.   PT eval for discharge planning.   Reviewed ur cx and Bl cx both- sensitive to Rocephin.  * Ac on CKD stage 3   IV fluids and monitor Out put.    Gradual improvement in renal function.  * Dm   Hold oral meds.   Finger stick.   On small dose lantus now.  * hypomagnesemia   Replace.  * metabolic encephalopathy    Due to septic shock. Improving npw.    * thrombocytopenia   Likely due to sepsis, monitor.   Don't give chemical DVT prophylaxis.    All the records are reviewed and case discussed with Care Management/Social Workerr. Management plans discussed with the patient, family and they are in agreement.  CODE STATUS: DNR  TOTAL TIME TAKING CARE OF THIS PATIENT: 40 minutes.   POSSIBLE D/C IN 2-3 DAYS, DEPENDING ON CLINICAL CONDITION.   Vaughan Basta M.D on 02/04/2016   Between 7am to 6pm - Pager - [phone]  After 6pm go to [email or web address] - password [email or web address] [facility]  Office  [phone]  CC: Primary care physician; [facility]  Note: This dictation was prepared with Dragon dictation along with smaller phrase technology. Any transcriptional errors that result from this process are unintentional.

## 2016-02-04 NOTE — Progress Notes (Signed)
Initial Nutrition Assessment  DOCUMENTATION CODES:   Obesity unspecified  INTERVENTION:  Provide Ensure Enlive po TID, each supplement provides 350 kcal and 20 grams of protein. Provide with meals so can be covered by Novolog. Patient prefers chocolate.  NUTRITION DIAGNOSIS:   Inadequate oral intake related to poor appetite, nausea as evidenced by per patient/family report, meal completion < 25%.  GOAL:   Patient will meet greater than or equal to 90% of their needs  MONITOR:   PO intake, Supplement acceptance, Labs, I & O's, Weight trends  REASON FOR ASSESSMENT:   Low [place]    ASSESSMENT:   83 year old female with PMHx of DM, HTN, HLD who presented with AMS, nausea and diarrhea found to have sepsis with bacteremia.   -Patient is being followed by Palliative Medicine. Family will be making decision regarding goals of care pending patient's response to treatment.  Spoke with patient at bedside. She was lethargic and unable to give a very detailed nutrition history. Patient reports she has a poor appetite but is unsure how long it has been going on. She is still nauseas. She reports she is not eating much. She is amenable to drinking chocolate Ensure.  Patient is unsure of UBW but believes she may have lost some weight since she is not eating well. Limited weight history in chart, but it appears patient was 172 lbs (78 kg) on 11/05/2015. Current body weight likely falsely elevated due to fluid accumulation.   Medications reviewed and include: ceftriaxone, Novolog sliding scale TID with meals and daily at bedtime, Lantus 9 units daily at bedtime.  Labs reviewed: CBG 142-237 past 24 hrs, BUN 61, Creatinine 2.91. No recent HgbA1c.  Nutrition-Focused physical exam completed. Findings are no fat depletion, no muscle depletion, and moderate edema.   Discussed with RN. Patient had some mashed potatoes and orange juice earlier today.  Diet Order:  Diet heart healthy/carb modified  Room service appropriate? Yes; Fluid consistency: Thin  Skin:  Reviewed, no issues  Last BM:  02/04/2016  Height:   Ht Readings from Last 1 Encounters:  02/03/16 5\' 4"  (1.626 m)    Weight:   Wt Readings from Last 1 Encounters:  02/03/16 193 lb 8 oz (87.8 kg)    Ideal Body Weight:  54.5 kg  BMI:  Body mass index is 33.21 kg/m.  Estimated Nutritional Needs:   Kcal:  1575-1700 (MSJ x 1.3-1.4)  Protein:  88-105 grams (1-1.2 grams/kg)  Fluid:  1.5 L/day  EDUCATION NEEDS:   No education needs identified at this time  Willey Blade, MS, RD, LDN Pager: [phone] After Hours Pager: [phone]

## 2016-02-04 NOTE — Progress Notes (Signed)
Inpatient Diabetes Program Recommendations  AACE/ADA: New Consensus Statement on Inpatient Glycemic Control (2015)  Target Ranges:  Prepandial:   less than 140 mg/dL      Peak postprandial:   less than 180 mg/dL (1-2 hours)      Critically ill patients:  140 - 180 mg/dL   Lab Results  Component Value Date   GLUCAP 237 (H) 02/04/2016   HGBA1C 5.8 11/26/2014    Review of Glycemic Control  Results for Sherry, [name] (MRN [id number]) as of 02/04/2016 12:20  Ref. Range 02/03/2016 16:43 02/03/2016 18:02 02/03/2016 20:16 02/04/2016 08:00 02/04/2016 11:44  Glucose-Capillary Latest Ref Range: 65 - 99 mg/dL 157 (H) 142 (H) 158 (H) 211 (H) 237 (H)     Diabetes history:DM2 Outpatient Diabetes medications: Glipizide 10 mg daily, Metformin 1000 mg BID  Current orders for Inpatient glycemic control: Lantus 7 units QHS, Novolog 0-15 units TID with meals, Novolog 0-5 units QHS  Inpatient Diabetes Program Recommendations: Fasting blood sugar x 2 days> 200mg /dl- please increase Lantus to 9 units qhs so patient begins her day in a normal blood sugar range and will likely require less large doses of bolus insulin through the day.   Correction (SSI): Due to poor PO intake and lethargy, please consider changing frequency of CBGs and Novolog to Q4H.  Gentry Fitz, RN, BA, MHA, CDE Diabetes Coordinator Inpatient Diabetes Program  [phone] (Team Pager) [phone] ([place]) 02/04/2016 12:22 PM

## 2016-02-04 NOTE — Progress Notes (Addendum)
[place] NOTE   Pharmacy Consult for electrolytes Indication: insulin drip  No Known Allergies  Patient Measurements: Height: 5\' 4"  (162.6 cm) Weight: 193 lb 8 oz (87.8 kg) IBW/kg (Calculated) : 54.7  Vital Signs: Temp: 98 F (36.7 C) (01/26 1129) Temp Source: Oral (01/26 1129) BP: 108/67 (01/26 1129) Pulse Rate: 81 (01/26 1129) Intake/Output from previous day: 01/25 0701 - 01/26 0700 In: 150 [P.O.:100; IV Piggyback:50] Out: 200 [Urine:200] Intake/Output from this shift: Total I/O In: 290 [P.O.:240; IV Piggyback:50] Out: 500 [Urine:500]  Labs:  Recent Labs  02/02/16 0553 02/02/16 0726 02/03/16 0407 02/04/16 0413  WBC  --  22.7* 27.5*  --   HGB  --  11.2* 12.5  --   HCT  --  33.2* 36.7  --   PLT  --  35* 27*  --   CREATININE 3.12*  --  3.10* 2.91*   Estimated Creatinine Clearance: 14.9 mL/min (by C-G formula based on SCr of 2.91 mg/dL (H)).   Medical History: Past Medical History:  Diagnosis Date  . Diabetes mellitus without complication ([place])   . Hyperlipidemia   . Hypertension      Assessment: 83yo admitted 1/21 with AMS/diarrhea. Blood sugars have been high since admission around 400s. Will start patient on insulin drip. Off insulin drip since 1/23.   Goal of Therapy:  K 3.5-5  Plan:  K=3.7. No replacement needed at this time.  Will continue to monitor and adjust per consult.   Medications reviewed for renal function. No changes needed at this time.   Rocky Morel, PharmD 02/04/2016,1:12 PM

## 2016-02-04 NOTE — Care Management Important Message (Signed)
Important Message  Patient Details  Name: MERCEDE ODENTHAL MRN: [id number] Date of Birth: [birth year]   Medicare Important Message Given:  Yes    Shelbie Ammons, RN 02/04/2016, 10:46 AM

## 2016-02-04 NOTE — Plan of Care (Signed)
Problem: Nutrition: Goal: Adequate nutrition will be maintained Outcome: Not Progressing Pt eating minimally at meals.  Son-in-law assisted with lunch but pt. Still did not each much.  Ensure given to pt. With assist to drink she only drink approximately 25% of drink before she stopped.

## 2016-02-05 LAB — GLUCOSE, CAPILLARY
GLUCOSE-CAPILLARY: 182 mg/dL — AB (ref 65–99)
GLUCOSE-CAPILLARY: 230 mg/dL — AB (ref 65–99)
Glucose-Capillary: 237 mg/dL — ABNORMAL HIGH (ref 65–99)
Glucose-Capillary: 213 mg/dL — ABNORMAL HIGH (ref 65–99)

## 2016-02-05 LAB — BASIC METABOLIC PANEL
Anion gap: 10 (ref 5–15)
BUN: 69 mg/dL — ABNORMAL HIGH (ref 6–20)
CALCIUM: 8 mg/dL — AB (ref 8.9–10.3)
CO2: 25 mmol/L (ref 22–32)
CREATININE: 2.47 mg/dL — AB (ref 0.44–1.00)
Chloride: 109 mmol/L (ref 101–111)
GFR, EST AFRICAN AMERICAN: 19 mL/min — AB (ref >60)
GFR, EST NON AFRICAN AMERICAN: 17 mL/min — AB (ref >60)
Glucose, Bld: 197 mg/dL — ABNORMAL HIGH (ref 65–99)
Potassium: 5.1 mmol/L (ref 3.5–5.1)
SODIUM: 144 mmol/L (ref 135–145)

## 2016-02-05 LAB — CBC
HCT: 36 % (ref 35.0–47.0)
Hemoglobin: 11.8 g/dL — ABNORMAL LOW (ref 12.0–16.0)
MCH: 28.2 pg (ref 26.0–34.0)
MCHC: 32.9 g/dL (ref 32.0–36.0)
MCV: 85.9 fL (ref 80.0–100.0)
PLATELETS: 31 10*3/uL — AB (ref 150–440)
RBC: 4.19 MIL/uL (ref 3.80–5.20)
RDW: 14 % (ref 11.5–14.5)
WBC: 32.6 10*3/uL — ABNORMAL HIGH (ref 3.6–11.0)

## 2016-02-05 NOTE — Progress Notes (Signed)
Patient has been doing fine, no acute complaints raised thus far. Kept safe and comfortable. Continues to receive IV antibiotic for her sepsis. Remaind afebrile. Needs attended.

## 2016-02-05 NOTE — Plan of Care (Signed)
Problem: Nutrition: Goal: Adequate nutrition will be maintained Outcome: Not Progressing Patient continues to take in inadequate amounts of nutrition. Did drink her Ensures

## 2016-02-05 NOTE — Progress Notes (Signed)
[name]    MR#:  [id number]  DATE OF BIRTH:  [birth year]  SUBJECTIVE:  CHIEF COMPLAINT:   Chief Complaint  Patient presents with  . Altered Mental Status  . Diarrhea   Brought with c/o nausea and diarrhea- found to be in sepsis with bacteremia. Very critical and drowsy not able to give details. Renal func is gradually worse, still have urine output. BP improved, off levophed drip, and more alert and oriented today. Very weak. Started eating but not much.  Better oral intake but still weakness. REVIEW OF SYSTEMS:   Review of Systems  Constitutional: Positive for malaise/fatigue. Negative for fever.  HENT: Negative for congestion, ear discharge, hearing loss, nosebleeds and sore throat.   Eyes: Negative for blurred vision, double vision and discharge.  Respiratory: Negative for cough, hemoptysis and shortness of breath.   Cardiovascular: Negative for chest pain, palpitations, orthopnea and leg swelling.  Gastrointestinal: Negative for abdominal pain, diarrhea, nausea and vomiting.  Genitourinary: Negative for dysuria and frequency.  Musculoskeletal: Negative for back pain, falls and myalgias.  Skin: Negative for rash.  Neurological: Positive for weakness. Negative for dizziness, tremors, focal weakness and headaches.  Psychiatric/Behavioral: Negative for depression.    DRUG ALLERGIES:  No Known Allergies  VITALS:  Blood pressure (!) 132/49, pulse 82, temperature 97.5 F (36.4 C), temperature source Oral, resp. rate 20, height 5\' 4"  (1.626 m), weight 193 lb 8 oz (87.8 kg), SpO2 97 %.  PHYSICAL EXAMINATION:  GENERAL:  83 y.o.-year-old patient lying in the bed with no acute distress. EYES: Pupils equal, round, reactive to light and accommodation. No scleral icterus. Extraocular muscles intact.  HEENT: Head atraumatic, normocephalic. Oropharynx and nasopharynx clear.  NECK:  Supple, no jugular venous  distention. No thyroid enlargement, no tenderness.  LUNGS: Normal breath sounds bilaterally, no wheezing, rales,rhonchi or crepitation. No use of accessory muscles of respiration.  CARDIOVASCULAR: S1, S2 normal. No murmurs, rubs, or gallops.  ABDOMEN: Soft, nontender, nondistended. Bowel sounds present. No organomegaly or mass. Foley in place,. EXTREMITIES: No pedal edema, cyanosis, or clubbing.  NEUROLOGIC: Follows commands, moves all 4 limbs power 4/5, gait not checked. No tremers. PSYCHIATRIC: The patient is alert and oriented.  SKIN: No obvious rash, lesion, or ulcer.   Physical Exam LABORATORY PANEL:   CBC  Recent Labs Lab 02/05/16 0809  WBC 32.6*  HGB 11.8*  HCT 36.0  PLT 31*   ------------------------------------------------------------------------------------------------------------------  Chemistries   Recent Labs Lab 01/31/16 2051  02/01/16 0542  02/05/16 0809  NA 137  < > 141  < > 144  K 3.8  < > 3.7  < > 5.1  CL 110  < > 110  < > 109  CO2 16*  < > 17*  < > 25  GLUCOSE 271*  < > 173*  < > 197*  BUN 35*  < > 35*  < > 69*  CREATININE 2.56*  < > 2.67*  < > 2.47*  CALCIUM 7.0*  < > 6.9*  < > 8.0*  MG 2.2  --   --   --   --   AST  --   --  100*  --   --   ALT  --   --  119*  --   --   ALKPHOS  --   --  132*  --   --   BILITOT  --   --  0.6  --   --   < > =  values in this interval not displayed. ------------------------------------------------------------------------------------------------------------------  Cardiac Enzymes  Recent Labs Lab 01/30/16 1720  TROPONINI 0.05*   ------------------------------------------------------------------------------------------------------------------  RADIOLOGY:  No results found.  ASSESSMENT AND PLAN:   Active Problems:   Sepsis ([place])   DNR (do not resuscitate)   Palliative care by specialist   Adult failure to thrive syndrome  * septic shock- gram negative bacteremia- proteus   Lactic acidosis    on  IV  ceftriaxone, off Levophed drip.   IV fluids   ID and CCM consult appreciated.   Flu and c diff negative.   No PNA on Xray chest.   Prognosis extremely poor. Palliative care consult.   Off levophed drip now. Cont Abx as per ID physician.   PT eval: SNF.   Reviewed ur cx and Bl cx both- sensitive to Rocephin. Worsening leukocytosis. Follow-up CBC.  * Ac on CKD stage 3  Continue IV fluids and monitor Out put.    Gradual improvement in renal function.  * DM   Hold oral meds.   Finger stick.   On 9 units lantus.  * hypomagnesemia   Replaced and improved.  * metabolic encephalopathy    Due to septic shock. Improved.    * thrombocytopenia   Likely due to sepsis, monitor.   Don't give chemical DVT prophylaxis.    All the records are reviewed and case discussed with Care Management/Social Workerr. Management plans discussed with the patient, family and they are in agreement.  CODE STATUS: DNR  TOTAL TIME TAKING CARE OF THIS PATIENT: 43 minutes.   POSSIBLE D/C IN 2-3 DAYS, DEPENDING ON CLINICAL CONDITION.   Demetrios Loll M.D on 02/05/2016   Between 7am to 6pm - Pager - [phone]  After 6pm go to [email or web address] - password [email or web address] [facility]  Office  [phone]  CC: Primary care physician; [employer]  Note: This dictation was prepared with Dragon dictation along with smaller phrase technology. Any transcriptional errors that result from this process are unintentional.

## 2016-02-05 NOTE — Progress Notes (Signed)
[place] NOTE   Pharmacy Consult for electrolytes Indication: insulin drip  No Known Allergies  Patient Measurements: Height: 5\' 4"  (162.6 cm) Weight: 193 lb 8 oz (87.8 kg) IBW/kg (Calculated) : 54.7  Vital Signs: Temp: 97.6 F (36.4 C) (01/27 0506) Temp Source: Oral (01/27 0506) BP: 142/51 (01/27 0506) Pulse Rate: 72 (01/27 0506) Intake/Output from previous day: 01/26 0701 - 01/27 0700 In: 530 [P.O.:480; IV Piggyback:50] Out: 1175 [id number] Intake/Output from this shift: No intake/output data recorded.  Labs:  Recent Labs  02/03/16 0407 02/04/16 0413 02/05/16 0809  WBC 27.5*  --  32.6*  HGB 12.5  --  11.8*  HCT 36.7  --  36.0  PLT 27*  --  31*  CREATININE 3.10* 2.91* 2.47*   Estimated Creatinine Clearance: 17.5 mL/min (by C-G formula based on SCr of 2.47 mg/dL (H)).   Medical History: Past Medical History:  Diagnosis Date  . Diabetes mellitus without complication ([place])   . Hyperlipidemia   . Hypertension      Assessment: 83yo admitted 1/21 with AMS/diarrhea. Blood sugars have been high since admission around 400s.  Off insulin drip since 1/23.   Goal of Therapy:  K 3.5-5  Plan:  K = 5.1. No replacement needed at this time.  Will continue to monitor and adjust per consult.   Medications reviewed for renal function. No changes needed at this time.   [place] Clinical Pharmacist 02/05/2016,9:12 AM

## 2016-02-06 LAB — CBC
HCT: 33.5 % — ABNORMAL LOW (ref 35.0–47.0)
Hemoglobin: 11 g/dL — ABNORMAL LOW (ref 12.0–16.0)
MCH: 28.7 pg (ref 26.0–34.0)
MCHC: 32.8 g/dL (ref 32.0–36.0)
MCV: 87.5 fL (ref 80.0–100.0)
PLATELETS: 40 10*3/uL — AB (ref 150–440)
RBC: 3.83 MIL/uL (ref 3.80–5.20)
RDW: 13.9 % (ref 11.5–14.5)
WBC: 16.9 10*3/uL — AB (ref 3.6–11.0)

## 2016-02-06 LAB — BASIC METABOLIC PANEL
ANION GAP: 10 (ref 5–15)
BUN: 66 mg/dL — ABNORMAL HIGH (ref 6–20)
CALCIUM: 8 mg/dL — AB (ref 8.9–10.3)
CO2: 29 mmol/L (ref 22–32)
CREATININE: 2.61 mg/dL — AB (ref 0.44–1.00)
Chloride: 104 mmol/L (ref 101–111)
GFR, EST AFRICAN AMERICAN: 18 mL/min — AB (ref >60)
GFR, EST NON AFRICAN AMERICAN: 16 mL/min — AB (ref >60)
GLUCOSE: 213 mg/dL — AB (ref 65–99)
Potassium: 3.6 mmol/L (ref 3.5–5.1)
Sodium: 143 mmol/L (ref 135–145)

## 2016-02-06 LAB — LACTIC ACID, PLASMA: Lactic Acid, Venous: 1 mmol/L (ref 0.5–1.9)

## 2016-02-06 LAB — GLUCOSE, CAPILLARY
GLUCOSE-CAPILLARY: 188 mg/dL — AB (ref 65–99)
GLUCOSE-CAPILLARY: 227 mg/dL — AB (ref 65–99)
Glucose-Capillary: 281 mg/dL — ABNORMAL HIGH (ref 65–99)
Glucose-Capillary: 316 mg/dL — ABNORMAL HIGH (ref 65–99)

## 2016-02-06 MED ORDER — ORAL CARE MOUTH RINSE
15.0000 mL | Freq: Two times a day (BID) | OROMUCOSAL | Status: DC
  Administered 2016-02-06 (×2): 15 mL via OROMUCOSAL

## 2016-02-06 MED ORDER — INSULIN GLARGINE 100 UNIT/ML ~~LOC~~ SOLN
12.0000 [IU] | Freq: Every day | SUBCUTANEOUS | Status: DC
  Administered 2016-02-06: 20:00:00 12 [IU] via SUBCUTANEOUS
  Filled 2016-02-06 (×2): qty 0.12

## 2016-02-06 MED ORDER — SODIUM CHLORIDE 0.9 % IV SOLN
INTRAVENOUS | Status: DC
  Administered 2016-02-06 – 2016-02-07 (×2): via INTRAVENOUS

## 2016-02-06 NOTE — Progress Notes (Signed)
[place] at [place] NAME: Sherry [name]    MR#:  [id number]  DATE OF BIRTH:  01-Jan-1928  SUBJECTIVE:  CHIEF COMPLAINT:   Chief Complaint  Patient presents with  . Altered Mental Status  . Diarrhea   Brought with c/o nausea and diarrhea- found to be in sepsis with bacteremia. Very critical and drowsy not able to give details. Renal func is gradually worse, still have urine output. BP improved, off levophed drip, and more alert and oriented today. Very weak. Started eating but not much.  Poor appetite and oral intake,  still weakness. REVIEW OF SYSTEMS:   Review of Systems  Constitutional: Positive for malaise/fatigue. Negative for fever.  HENT: Negative for congestion, ear discharge, hearing loss, nosebleeds and sore throat.   Eyes: Negative for blurred vision, double vision and discharge.  Respiratory: Negative for cough, hemoptysis and shortness of breath.   Cardiovascular: Negative for chest pain, palpitations, orthopnea and leg swelling.  Gastrointestinal: Negative for abdominal pain, diarrhea, nausea and vomiting.  Genitourinary: Negative for dysuria and frequency.  Musculoskeletal: Negative for back pain, falls and myalgias.  Skin: Negative for rash.  Neurological: Positive for weakness. Negative for dizziness, tremors, focal weakness and headaches.  Psychiatric/Behavioral: Negative for depression.    DRUG ALLERGIES:  No Known Allergies  VITALS:  Blood pressure (!) 124/46, pulse 86, temperature 98.2 F (36.8 C), temperature source Oral, resp. rate 20, height 5\' 4"  (1.626 m), weight 193 lb 8 oz (87.8 kg), SpO2 93 %.  PHYSICAL EXAMINATION:  GENERAL:  83 y.o.-year-old patient lying in the bed with no acute distress. EYES: Pupils equal, round, reactive to light and accommodation. No scleral icterus. Extraocular muscles intact.  HEENT: Head atraumatic, normocephalic. Oropharynx and nasopharynx clear.  NECK:  Supple, no jugular venous  distention. No thyroid enlargement, no tenderness.  LUNGS: Normal breath sounds bilaterally, no wheezing, rales,rhonchi or crepitation. No use of accessory muscles of respiration.  CARDIOVASCULAR: S1, S2 normal. No murmurs, rubs, or gallops.  ABDOMEN: Soft, nontender, nondistended. Bowel sounds present. No organomegaly or mass. Foley in place,. EXTREMITIES: No pedal edema, cyanosis, or clubbing.  NEUROLOGIC: Follows commands, moves all 4 limbs power 4/5, gait not checked. No tremers. PSYCHIATRIC: The patient is alert and oriented.  SKIN: No obvious rash, lesion, or ulcer.   Physical Exam LABORATORY PANEL:   CBC  Recent Labs Lab 02/06/16 0649  WBC 16.9*  HGB 11.0*  HCT 33.5*  PLT 40*   ------------------------------------------------------------------------------------------------------------------  Chemistries   Recent Labs Lab 01/31/16 2051  02/01/16 0542  02/06/16 0649  NA 137  < > 141  < > 143  K 3.8  < > 3.7  < > 3.6  CL 110  < > 110  < > 104  CO2 16*  < > 17*  < > 29  GLUCOSE 271*  < > 173*  < > 213*  BUN 35*  < > 35*  < > 66*  CREATININE 2.56*  < > 2.67*  < > 2.61*  CALCIUM 7.0*  < > 6.9*  < > 8.0*  MG 2.2  --   --   --   --   AST  --   --  100*  --   --   ALT  --   --  119*  --   --   ALKPHOS  --   --  132*  --   --   BILITOT  --   --  0.6  --   --   < > =  values in this interval not displayed. ------------------------------------------------------------------------------------------------------------------  Cardiac Enzymes  Recent Labs Lab 01/30/16 1720  TROPONINI 0.05*   ------------------------------------------------------------------------------------------------------------------  RADIOLOGY:  No results found.  ASSESSMENT AND PLAN:   Active Problems:   Sepsis ([place])   DNR (do not resuscitate)   Palliative care by specialist   Adult failure to thrive syndrome  * septic shock- gram negative bacteremia- proteus   Lactic acidosis    on  IV  ceftriaxone, off Levophed drip.   IV fluids   ID and CCM consult appreciated.   Flu and c diff negative.   No PNA on Xray chest.   Prognosis extremely poor. Palliative care consult.   Off levophed drip now. Cont Abx as per ID physician.   PT eval: SNF.   Reviewed ur cx and Bl cx both- sensitive to Rocephin. better leukocytosis. Follow-up CBC.  * Ac on CKD stage 3  Continue IV fluids and monitor Out put.    Gradual improvement in renal function but worsening today.  * DM with hyperglycemia. BS 361.   Hold oral meds.   On 9 units lantus and sliding scale. Increase Lantus to 12 units at bedtime.  * hypomagnesemia   Replaced and improved.  * metabolic encephalopathy    Due to septic shock. Improved.    * thrombocytopenia   Likely due to sepsis, monitor.   Don't give chemical DVT prophylaxis.    All the records are reviewed and case discussed with Care Management/Social Workerr. Management plans discussed with the patient, her son and they are in agreement.  CODE STATUS: DNR  TOTAL TIME TAKING CARE OF THIS PATIENT: 39 minutes.   POSSIBLE D/C IN 2-3 DAYS, DEPENDING ON CLINICAL CONDITION.   Demetrios Loll M.D on 02/06/2016   Between 7am to 6pm - Pager - [phone]  After 6pm go to [email or web address] - password [email or web address] [facility]  Office  [phone]  CC: Primary care physician; [facility]  Note: This dictation was prepared with Dragon dictation along with smaller phrase technology. Any transcriptional errors that result from this process are unintentional.

## 2016-02-06 NOTE — Progress Notes (Signed)
[facility] NOTE   Pharmacy Consult for electrolytes Indication: insulin drip  No Known Allergies  Patient Measurements: Height: 5\' 4"  (162.6 cm) Weight: 193 lb 8 oz (87.8 kg) IBW/kg (Calculated) : 54.7  Vital Signs: Temp: 97.9 F (36.6 C) (01/28 0520) Temp Source: Oral (01/28 0520) BP: 144/51 (01/28 0520) Pulse Rate: 72 (01/28 0520) Intake/Output from previous day: 01/27 0701 - 01/28 0700 In: 170 [P.O.:120; IV Piggyback:50] Out: 601 [Urine:600; Stool:1] Intake/Output from this shift: Total I/O In: 50 [IV Piggyback:50] Out: -   Labs:  Recent Labs  02/04/16 0413 02/05/16 0809 02/06/16 0649  WBC  --  32.6* 16.9*  HGB  --  11.8* 11.0*  HCT  --  36.0 33.5*  PLT  --  31* 40*  CREATININE 2.91* 2.47* 2.61*   Estimated Creatinine Clearance: 16.6 mL/min (by C-G formula based on SCr of 2.61 mg/dL (H)).   Medical History: Past Medical History:  Diagnosis Date  . Diabetes mellitus without complication ([place])   . Hyperlipidemia   . Hypertension      Assessment: 83yo admitted 1/21 with AMS/diarrhea. Blood sugars have been high since admission around 400s.  Off insulin drip since 1/23.   Goal of Therapy:  K 3.5-5  Plan:  Electrolytes WNL. No replacement needed at this time.  Will continue to monitor and adjust per consult.   Medications reviewed for renal function. No changes needed at this time.   [place] Clinical Pharmacist 02/06/2016,10:04 AM
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
## 2016-02-07 DIAGNOSIS — Z7189 Other specified counseling: Secondary | ICD-10-CM

## 2016-02-07 DIAGNOSIS — Z515 Encounter for palliative care: Secondary | ICD-10-CM

## 2016-02-07 LAB — GLUCOSE, CAPILLARY
GLUCOSE-CAPILLARY: 251 mg/dL — AB (ref 65–99)
GLUCOSE-CAPILLARY: 150 mg/dL — AB (ref 65–99)
Glucose-Capillary: 177 mg/dL — ABNORMAL HIGH (ref 65–99)

## 2016-02-07 MED ORDER — ENSURE ENLIVE PO LIQD
237.0000 mL | Freq: Three times a day (TID) | ORAL | 12 refills | Status: DC
Start: 2016-02-07 — End: 2016-03-27

## 2016-02-07 MED ORDER — CEPHALEXIN 500 MG PO CAPS: 500.0000 mg | ORAL_CAPSULE | Freq: Two times a day (BID) | ORAL | Status: DC

## 2016-02-07 MED ORDER — CEPHALEXIN 500 MG PO CAPS: 500.0000 mg | ORAL_CAPSULE | Freq: Two times a day (BID) | ORAL | 0 refills | Status: DC

## 2016-02-07 NOTE — Progress Notes (Addendum)
Daily Progress Note   Patient Name: Sherry [name]       Date: 02/07/2016 DOB: 1927-01-28  Age: 83 y.o. MRN#: [id number] Attending Physician: Demetrios Loll, MD Primary Care Physician: [place] Date: 01/30/2016  Reason for Consultation/Follow-up: Disposition and Establishing goals of care regarding sepsis infection, CKD, anorexia, and AMS.  Subjective: We meet Ms. Hinde at bedside, where she is lying comfortably watching TV.  She is friendly and conversational, although doesn't answer all our questions appropriately.  When asked if she is comfortable, she tells [place] her back hurts some, but otherwise denies pain, nausea, SOB.  She states she ate breakfast this morning.  Ms. Crosthwaite does not know why she is in the hospital, but doesn't seem bothered by her lack of understanding.  She confirms that she lives at home with her son, [name], and that her daughter, [name], has been coming to visit her at the hospital daily.  After our visit, I spoke with [name] on the phone.  She voiced concern for her mother's living situation with [name], and emphasized that her mother will live with her after acute SNF rehab.  She has been in contact with case manager Hassan Rowan about SNF options, and plans to visit a couple in the next day or two.  She will follow up with social worker Jarrett Soho to finalize placement plans.  We discussed palliative care at the SNF and she was very receptive to the idea.     Patient Profile/HPI: Patient is a 83yo female with a history of DM, HTN, HLD came to the ED with diarrhea, nausea, and AMS.  Per daughter, she was found sitting in her own feces, confused.  She was admitted to for sepsis with bacteremia, with acute on chronic stage 3 renal failure.  Renal function has declined and  then slightly improved since.  Appetite, though still diminished, is also slowly improving.     Length of Stay: 8  Current Medications: Scheduled Meds:  . cefTRIAXone  2 g Intravenous Daily  . feeding supplement (ENSURE ENLIVE)  237 mL Oral TID WC  . gabapentin  300 mg Oral QHS  . insulin aspart  0-15 Units Subcutaneous TID WC  . insulin aspart  0-5 Units Subcutaneous QHS  . insulin glargine  12 Units Subcutaneous QHS  . mouth  rinse  15 mL Mouth Rinse BID  . sodium chloride flush  3 mL Intravenous Q12H    Continuous Infusions: . sodium chloride Stopped (02/07/16 1004)    PRN Meds: acetaminophen **OR** acetaminophen, albuterol, bisacodyl, clonazePAM, dextrose, ipratropium, magnesium citrate, ondansetron **OR** ondansetron (ZOFRAN) IV, oxyCODONE-acetaminophen, senna-docusate  Physical Exam  Constitutional: She appears well-developed and well-nourished. No distress.  HENT:  Head: Normocephalic and atraumatic.  Cardiovascular: Normal rate and regular rhythm.   Pulmonary/Chest: Effort normal and breath sounds normal.  Abdominal: Bowel sounds are normal.  Mild distention and slight grimace to deep palpation of right quadrants  Musculoskeletal:  2+ edema of all 4 extremities  Neurological: She is alert.  Skin: Skin is warm and dry.  Psychiatric: She has a normal mood and affect.         2 of 6   Vital Signs: BP 136/84 (BP Location: Right Arm)   Pulse 84   Temp 97.9 F (36.6 C) (Oral)   Resp 20   Ht 5\' 4"  (1.626 m)   Wt 87.8 kg (193 lb 8 oz)   SpO2 91%   BMI 33.21 kg/m  SpO2: SpO2: 91 % O2 Device: O2 Device: Not Delivered O2 Flow Rate: O2 Flow Rate (L/min): 2 L/min  Intake/output summary:  Intake/Output Summary (Last 24 hours) at 02/07/16 1108 Last data filed at 02/07/16 [id number]  Gross per 24 hour  Intake             1255 ml  Output              950 ml  Net              305 ml   LBM: Last BM Date: 02/07/16 Baseline Weight: Weight: 77.1 kg (170 lb) Most recent  weight: Weight: 87.8 kg (193 lb 8 oz)       Palliative Assessment/Data:    Flowsheet Rows   Flowsheet Row Most Recent Value  Intake Tab  Referral Department  Hospitalist  Unit at Time of Referral  ICU  Palliative Care Primary Diagnosis  Sepsis/Infectious Disease  Date Notified  01/31/16  Palliative Care Type  New Palliative care  Reason for referral  Clarify Goals of Care  Date of Admission  01/30/16  Date first seen by Palliative Care  02/02/16  # of days Palliative referral response time  2 Day(s)  # of days IP prior to Palliative referral  1  Clinical Assessment  Palliative Performance Scale Score  40%  Psychosocial & Spiritual Assessment  Palliative Care Outcomes  Patient/Family meeting held?  Yes  Who was at the meeting?  Patient, daughter on phone       Patient Active Problem List   Diagnosis Date Noted  . Adult failure to thrive syndrome   . DNR (do not resuscitate)   . Palliative care by specialist   . Sepsis ([place]) 11/26/2014    Palliative Care Plan    Recommendations/Plan:  Please include palliative care to follow at SNF in discharge summary  Goals of Care and Additional Recommendations:  Limitations on Scope of Treatment: Full Scope Treatment  Code Status:  DNR  Prognosis:   Unable to determine   Discharge Planning:  [facility] for rehab with Palliative care service follow-up  Care plan was discussed with patient and daughter [name]  Thank you for allowing the Palliative Medicine Team to assist in the care of this patient.  Total time spent:  40 minutes     Greater than 50%  of this time was spent counseling and coordinating care related to the above assessment and plan.  Olene Floss, PA-S Imogene Burn, [place] [facility] Pager: [phone]   Please contact Palliative MedicineTeam phone at [phone] for questions and concerns between 7 am - 7 pm.   Please see AMION for individual provider pager  numbers.

## 2016-02-07 NOTE — Progress Notes (Signed)
Report called to [place] at [facility]. Awaiting EMS transportation. Madlyn Frankel, RN

## 2016-02-07 NOTE — Discharge Summary (Addendum)
[facility] [place] NAME: Sherry [name]    MR#:  [id number]  DATE OF BIRTH:  [birth year]  DATE OF ADMISSION:  01/30/2016   ADMITTING PHYSICIAN: Harvie Bridge, DO  DATE OF DISCHARGE: 02/07/2016  PRIMARY CARE PHYSICIAN: [place]   ADMISSION DIAGNOSIS:  Nausea vomiting and diarrhea [R11.2, R19.7] Sepsis, due to unspecified organism ([place]) [A41.9] DISCHARGE DIAGNOSIS:  Active Problems:   Sepsis ([place])   DNR (do not resuscitate)   Palliative care by specialist   Adult failure to thrive syndrome   Goals of care, counseling/discussion bacteremia Ac on CKD stage 3 metabolic encephalopathy  thrombocytopenia SECONDARY DIAGNOSIS:   Past Medical History:  Diagnosis Date  . Diabetes mellitus without complication ([place])   . Hyperlipidemia   . Hypertension    HOSPITAL COURSE:   * septic shock- gram negative bacteremia- proteus   Lactic acidosis    on  IV ceftriaxone, off Levophed drip.   IV fluids   ID and CCM consult appreciated.   Flu and c diff negative.   No PNA on Xray chest.   Prognosis extremely poor. Palliative care consult.   Off levophed drip now. Cont Abx as per ID physician.   PT eval: SNF.   Reviewed ur cx and Bl cx both- sensitive to Rocephin. Would rec a 14 day total abx - can change to keflex 500 bid for 4-5 more days per Dr. Ola Spurr. better leukocytosis. Follow-up CBC as outpatient.  * Ac on CKD stage 3 Improving with IV fluids, lisinopril-HCTZ was discontinued.    Gradual improvement in renal function, f/u BMP as outpatient.  * DM with hyperglycemia. BS is better controlled.   Hold oral meds.   On 9 units lantus and sliding scale. Increased Lantus to 12 units at bedtime. Resume glipizide after discharge but hold metformin due to renal failure.  * hypomagnesemia   Replaced and improved.  * metabolic encephalopathy    Due to septic shock. Improved.    * thrombocytopenia   Likely  due to sepsis, monitor.   Don't give chemical DVT prophylaxis.  DISCHARGE CONDITIONS:  Stable, discharge to SNF today. CONSULTS OBTAINED:  Treatment Team:  Leonel Ramsay, MD DRUG ALLERGIES:  No Known Allergies DISCHARGE MEDICATIONS:   Allergies as of 02/07/2016   No Known Allergies     Medication List    STOP taking these medications   diclofenac sodium 1 % Gel Commonly known as:  VOLTAREN   lisinopril-hydrochlorothiazide 10-12.5 MG tablet Commonly known as:  PRINZIDE,ZESTORETIC   metFORMIN 1000 MG tablet Commonly known as:  GLUCOPHAGE   naproxen sodium 275 MG tablet Commonly known as:  ANAPROX   oxyCODONE-acetaminophen 5-325 MG tablet Commonly known as:  PERCOCET/ROXICET     TAKE these medications   atorvastatin 20 MG tablet Commonly known as:  LIPITOR Take 1 tablet by mouth at bedtime.   cephALEXin 500 MG capsule Commonly known as:  KEFLEX Take 1 capsule (500 mg total) by mouth every 12 (twelve) hours. Start taking on:  02/08/2016   feeding supplement (ENSURE ENLIVE) Liqd Take 237 mLs by mouth 3 (three) times daily with meals.   gabapentin 300 MG capsule Commonly known as:  NEURONTIN Take 1 capsule by mouth at bedtime.   glipiZIDE 10 MG 24 hr tablet Commonly known as:  GLUCOTROL XL Take 1 tablet by mouth daily.   T.E.D. KNEE LENGTH/M-LONG Misc 2 application by Does not apply route every morning.  DISCHARGE INSTRUCTIONS:  See AVS.  If you experience worsening of your admission symptoms, develop shortness of breath, life threatening emergency, suicidal or homicidal thoughts you must seek medical attention immediately by calling 911 or calling your MD immediately  if symptoms less severe.  You Must read complete instructions/literature along with all the possible adverse reactions/side effects for all the Medicines you take and that have been prescribed to you. Take any new Medicines after you have completely understood and accpet all the  possible adverse reactions/side effects.   Please note  You were cared for by a hospitalist during your hospital stay. If you have any questions about your discharge medications or the care you received while you were in the hospital after you are discharged, you can call the unit and asked to speak with the hospitalist on call if the hospitalist that took care of you is not available. Once you are discharged, your primary care physician will handle any further medical issues. Please note that NO REFILLS for any discharge medications will be authorized once you are discharged, as it is imperative that you return to your primary care physician (or establish a relationship with a primary care physician if you do not have one) for your aftercare needs so that they can reassess your need for medications and monitor your lab values.    On the day of Discharge:  VITAL SIGNS:  Blood pressure 136/84, pulse 84, temperature 97.9 F (36.6 C), temperature source Oral, resp. rate 20, height 5\' 4"  (1.626 m), weight 193 lb 8 oz (87.8 kg), SpO2 91 %. PHYSICAL EXAMINATION:  GENERAL:  83 y.o.-year-old patient lying in the bed with no acute distress.  EYES: Pupils equal, round, reactive to light and accommodation. No scleral icterus. Extraocular muscles intact.  HEENT: Head atraumatic, normocephalic. Oropharynx and nasopharynx clear.  NECK:  Supple, no jugular venous distention. No thyroid enlargement, no tenderness.  LUNGS: Normal breath sounds bilaterally, no wheezing, rales,rhonchi or crepitation. No use of accessory muscles of respiration.  CARDIOVASCULAR: S1, S2 normal. No murmurs, rubs, or gallops.  ABDOMEN: Soft, non-tender, non-distended. Bowel sounds present. No organomegaly or mass.  EXTREMITIES: bilateral arm edema, no cyanosis, or clubbing.  NEUROLOGIC: Cranial nerves II through XII are intact. Muscle strength 3-4/5 in all extremities. Sensation intact. Gait not checked.  PSYCHIATRIC: The patient is  alert and oriented x 3.  SKIN: No obvious rash, lesion, or ulcer.  DATA REVIEW:   CBC  Recent Labs Lab 02/06/16 0649  WBC 16.9*  HGB 11.0*  HCT 33.5*  PLT 40*    Chemistries   Recent Labs Lab 01/31/16 2051  02/01/16 0542  02/06/16 0649  NA 137  < > 141  < > 143  K 3.8  < > 3.7  < > 3.6  CL 110  < > 110  < > 104  CO2 16*  < > 17*  < > 29  GLUCOSE 271*  < > 173*  < > 213*  BUN 35*  < > 35*  < > 66*  CREATININE 2.56*  < > 2.67*  < > 2.61*  CALCIUM 7.0*  < > 6.9*  < > 8.0*  MG 2.2  --   --   --   --   AST  --   --  100*  --   --   ALT  --   --  119*  --   --   ALKPHOS  --   --  132*  --   --  BILITOT  --   --  0.6  --   --   < > = values in this interval not displayed.   Microbiology Results  Results for orders placed or performed during the hospital encounter of 01/30/16  Urine culture     Status: Abnormal   Collection Time: 01/30/16  4:17 PM  Result Value Ref Range Status   Specimen Description URINE, RANDOM  Final   Special Requests NONE  Final   Culture >=100,000 COLONIES/mL KLEBSIELLA SPECIES (A)  Final   Report Status 02/02/2016 FINAL  Final   Organism ID, Bacteria KLEBSIELLA SPECIES (A)  Final      Susceptibility   Klebsiella species - MIC*    AMPICILLIN 16 RESISTANT Resistant     CEFAZOLIN <=4 SENSITIVE Sensitive     CEFTRIAXONE <=1 SENSITIVE Sensitive     CIPROFLOXACIN <=0.25 SENSITIVE Sensitive     GENTAMICIN <=1 SENSITIVE Sensitive     IMIPENEM <=0.25 SENSITIVE Sensitive     NITROFURANTOIN 64 INTERMEDIATE Intermediate     TRIMETH/SULFA <=20 SENSITIVE Sensitive     AMPICILLIN/SULBACTAM 4 SENSITIVE Sensitive     PIP/TAZO <=4 SENSITIVE Sensitive     Extended ESBL NEGATIVE Sensitive     * >=100,000 COLONIES/mL KLEBSIELLA SPECIES  Blood Culture (routine x 2)     Status: Abnormal   Collection Time: 01/30/16  5:21 PM  Result Value Ref Range Status   Specimen Description BLOOD RIGHT ANTECUBITAL  Final   Special Requests BOTTLES DRAWN AEROBIC AND  ANAEROBIC 10CCAER,9CCANA  Final   Culture  Setup Time   Final    GRAM NEGATIVE RODS IN BOTH AEROBIC AND ANAEROBIC BOTTLES CRITICAL RESULT CALLED TO, READ BACK BY AND VERIFIED WITH: CHRISTINE KATSOUDAS ON 01/31/16 AT 1025 QSD Performed at [facility], [place] [address] [place], [place] [address]    Culture PROTEUS MIRABILIS (A)  Final   Report Status 02/02/2016 FINAL  Final   Organism ID, Bacteria PROTEUS MIRABILIS  Final      Susceptibility   Proteus mirabilis - MIC*    AMPICILLIN <=2 SENSITIVE Sensitive     CEFAZOLIN <=4 SENSITIVE Sensitive     CEFEPIME <=1 SENSITIVE Sensitive     CEFTAZIDIME <=1 SENSITIVE Sensitive     CEFTRIAXONE <=1 SENSITIVE Sensitive     CIPROFLOXACIN >=4 RESISTANT Resistant     GENTAMICIN 8 INTERMEDIATE Intermediate     IMIPENEM 4 SENSITIVE Sensitive     TRIMETH/SULFA >=320 RESISTANT Resistant     AMPICILLIN/SULBACTAM <=2 SENSITIVE Sensitive     PIP/TAZO <=4 SENSITIVE Sensitive     * PROTEUS MIRABILIS  Blood Culture ID Panel (Reflexed)     Status: Abnormal   Collection Time: 01/30/16  5:21 PM  Result Value Ref Range Status   Enterococcus species NOT DETECTED NOT DETECTED Final   Listeria monocytogenes NOT DETECTED NOT DETECTED Final   Staphylococcus species NOT DETECTED NOT DETECTED Final   Staphylococcus aureus NOT DETECTED NOT DETECTED Final   Streptococcus species NOT DETECTED NOT DETECTED Final   Streptococcus agalactiae NOT DETECTED NOT DETECTED Final   Streptococcus pneumoniae NOT DETECTED NOT DETECTED Final   Streptococcus pyogenes NOT DETECTED NOT DETECTED Final   Acinetobacter baumannii NOT DETECTED NOT DETECTED Final   Enterobacteriaceae species DETECTED (A) NOT DETECTED Final    Comment: CRITICAL RESULT CALLED TO, READ BACK BY AND VERIFIED WITH: CHRISTINE KATSOUDAS ON 01/31/16 AT 1025 QSD    Enterobacter cloacae complex NOT DETECTED NOT DETECTED Final   Escherichia coli NOT DETECTED  NOT DETECTED Final   Klebsiella oxytoca NOT DETECTED  NOT DETECTED Final   Klebsiella pneumoniae NOT DETECTED NOT DETECTED Final   Proteus species DETECTED (A) NOT DETECTED Final    Comment: CRITICAL RESULT CALLED TO, READ BACK BY AND VERIFIED WITH: [place] ON 01/31/16 AT 1025 QSD    Serratia marcescens NOT DETECTED NOT DETECTED Final   Carbapenem resistance NOT DETECTED NOT DETECTED Final   Haemophilus influenzae NOT DETECTED NOT DETECTED Final   Neisseria meningitidis NOT DETECTED NOT DETECTED Final   Pseudomonas aeruginosa NOT DETECTED NOT DETECTED Final   Candida albicans NOT DETECTED NOT DETECTED Final   Candida glabrata NOT DETECTED NOT DETECTED Final   Candida krusei NOT DETECTED NOT DETECTED Final   Candida parapsilosis NOT DETECTED NOT DETECTED Final   Candida tropicalis NOT DETECTED NOT DETECTED Final  Blood Culture (routine x 2)     Status: Abnormal   Collection Time: 01/30/16  5:25 PM  Result Value Ref Range Status   Specimen Description BLOOD LEFT HAND  Final   Special Requests   Final    BOTTLES DRAWN AEROBIC AND ANAEROBIC 10CCAERO,10CCANA   Culture  Setup Time   Final    GRAM NEGATIVE RODS IN BOTH AEROBIC AND ANAEROBIC BOTTLES CRITICAL RESULT CALLED TO, READ BACK BY AND VERIFIED WITH: CHRISTINE KATSOUDAS ON 01/31/16 AT 1025 QSD    Culture (A)  Final    PROTEUS MIRABILIS SUSCEPTIBILITIES PERFORMED ON PREVIOUS CULTURE WITHIN THE LAST 5 DAYS. Performed at [facility], [place] [address], [place], [place] [address]    Report Status 02/02/2016 FINAL  Final  MRSA PCR Screening     Status: None   Collection Time: 01/31/16  5:47 AM  Result Value Ref Range Status   MRSA by PCR NEGATIVE NEGATIVE Final    Comment:        The GeneXpert MRSA Assay (FDA approved for NASAL specimens only), is one component of a comprehensive MRSA colonization surveillance program. It is not intended to diagnose MRSA infection nor to guide or monitor treatment for MRSA infections.   C difficile quick scan w PCR reflex      Status: None   Collection Time: 01/31/16  7:33 AM  Result Value Ref Range Status   C Diff antigen NEGATIVE NEGATIVE Final   C Diff toxin NEGATIVE NEGATIVE Final   C Diff interpretation No C. difficile detected.  Final  CULTURE, BLOOD (ROUTINE X 2) w Reflex to ID Panel     Status: None (Preliminary result)   Collection Time: 02/03/16 11:51 AM  Result Value Ref Range Status   Specimen Description BLOOD  LEFT AC  Final   Special Requests   Final    BOTTLES DRAWN AEROBIC AND ANAEROBIC ARE 8 ML ANA 7 ML   Culture NO GROWTH 4 DAYS  Final   Report Status PENDING  Incomplete    RADIOLOGY:  No results found.   Management plans discussed with the patient, family and they are in agreement.  CODE STATUS:     Code Status Orders        Start     Ordered   01/31/16 1745  Do not attempt resuscitation (DNR)  Continuous    Question Answer Comment  In the event of cardiac or respiratory ARREST Do not call a "code blue"   In the event of cardiac or respiratory ARREST Do not perform Intubation, CPR, defibrillation or ACLS   In the event of cardiac or respiratory ARREST Use medication  by any route, position, wound care, and other measures to relive pain and suffering. May use oxygen, suction and manual treatment of airway obstruction as needed for comfort.      01/31/16 1744    Code Status History    Date Active Date Inactive Code Status Order ID Comments User Context   01/30/2016 11:19 PM 01/31/2016  5:44 PM Full Code [id number]  Harvie Bridge, DO Inpatient   11/26/2014  3:52 PM 11/28/2014  3:07 PM Full Code [id number]  Max Sane, MD Inpatient   11/26/2014  8:59 AM 11/26/2014  3:52 PM Full Code [id number]  Max Sane, MD ED      TOTAL TIME TAKING CARE OF THIS PATIENT: 37 minutes.    Demetrios Loll M.D on 02/07/2016 at 1:59 PM  Between 7am to 6pm - Pager - [phone]  After 6pm go to [email or web address] - Technical brewer [place] [facility]  Office  [phone]  CC:  Primary care physician; [facility]   Note: This dictation was prepared with Dragon dictation along with smaller phrase technology. Any transcriptional errors that result from this process are unintentional.

## 2016-02-07 NOTE — Discharge Instructions (Signed)
Heart healthy and ADA diet. Fall and aspiration precaution. Keflex 4 days.

## 2016-02-07 NOTE — Progress Notes (Signed)
Physical Therapy Treatment Patient Details Name: Sherry [name] MRN: [id number] DOB: 1927-05-02 Today's Date: 02/07/2016    History of Present Illness Pt is a 83 y/o F who was found by daughter to have AMS sitting in her own diarrhea.  Her blood sugar was greater than 400.  A code sepsis was called, pt febrile, tachycardic, tachypneic, and temperature 102.5.  Once on the floor pt became hypotensive and lethargic.  Pt's PMH includes back surgery, shoulder surgery.    PT Comments    Pt presents with deficits in strength, transfers, mobility, gait, balance, and activity tolerance.  Pt continues to require max A with bed mobility tasks and tolerated supine therex well with good effort and motivation.  Pt will benefit from PT services to address above deficits for decreased caregiver assistance upon discharge.     Follow Up Recommendations  SNF     Equipment Recommendations       Recommendations for Other Services       Precautions / Restrictions Precautions Precautions: Fall Restrictions Weight Bearing Restrictions: No    Mobility  Bed Mobility Overal bed mobility: Needs Assistance Bed Mobility: Rolling Rolling: Max assist            Transfers                 General transfer comment: Uanble to attempt at this time  Ambulation/Gait             General Gait Details: Not attempted   Stairs            Wheelchair Mobility    Modified Rankin (Stroke Patients Only)       Balance                                    Cognition Arousal/Alertness: Awake/alert Behavior During Therapy: WFL for tasks assessed/performed Overall Cognitive Status: Within Functional Limits for tasks assessed                      Exercises Total Joint Exercises Ankle Circles/Pumps: Strengthening;Both;10 reps;15 reps Quad Sets: AROM;Both;10 reps;15 reps Gluteal Sets: AROM;Both;10 reps Short Arc Quad: Strengthening;Both;10 reps;15 reps Heel Slides:  AAROM;Both;10 reps Hip ABduction/ADduction: AAROM;Both;10 reps;15 reps Straight Leg Raises: AAROM;Both;10 reps Knee Flexion: Strengthening;Both;10 reps Other Exercises Other Exercises: B leg press with manual resistance in supine 2 x 10    General Comments        Pertinent Vitals/Pain Pain Assessment: No/denies pain    Home Living                      Prior Function            PT Goals (current goals can now be found in the care plan section)      Frequency    Min 2X/week      PT Plan Current plan remains appropriate    Co-evaluation             End of Session   Activity Tolerance: Patient limited by fatigue Patient left: in bed;with bed alarm set;with SCD's reapplied;with call bell/phone within reach     Time: 1545-1609 PT Time Calculation (min) (ACUTE ONLY): 24 min  Charges:  $Therapeutic Exercise: 23-37 mins                    G Codes:      D.  Royetta Asal PT, DPT 02/07/16, 4:24 PM

## 2016-02-07 NOTE — Clinical Social Work Placement (Signed)
   CLINICAL SOCIAL WORK PLACEMENT  NOTE  Date:  02/07/2016  Patient Details  Name: Sherry [name] MRN: [id number] Date of Birth: 03-13-1927  Clinical Social Work is seeking post-discharge placement for this patient at the [place] level of care (*CSW will initial, date and re-position this form in  chart as items are completed):  Yes   Patient/family provided with [place] Work Department's list of facilities offering this level of care within the geographic area requested by the patient (or if unable, by the patient's family).  Yes   Patient/family informed of their freedom to choose among providers that offer the needed level of care, that participate in Medicare, Medicaid or managed care program needed by the patient, have an available bed and are willing to accept the patient.  Yes   Patient/family informed of [facility]'s ownership interest in [facility] and [facility], as well as of the fact that they are under no obligation to receive care at these facilities.  PASRR submitted to EDS on 02/01/16     PASRR number received on 02/01/16     Existing PASRR number confirmed on       FL2 transmitted to all facilities in geographic area requested by pt/family on 02/01/16     FL2 transmitted to all facilities within larger geographic area on       Patient informed that his/her managed care company has contracts with or will negotiate with certain facilities, including the following:            Patient/family informed of bed offers received.  02/07/2016   Patient chooses bed at      [facility]  Physician recommends and patient chooses bed at       [facility] Patient to be transferred to   on  .  02/07/2016   Patient to be transferred to facility by      EMS  Patient family notified on   of transfer.  Daughter [name]  Name of family member notified:        PHYSICIAN       Additional Comment:     _______________________________________________ Lilly Cove, LCSW 02/07/2016, 2:47 PM

## 2016-02-07 NOTE — Progress Notes (Signed)
Sherry [name] is a 83 y.o. female with Active Problems:   Sepsis ([place])   DNR (do not resuscitate)   Palliative care by specialist   Adult failure to thrive syndrome   Subjective: Out of unit, being dced  ROS  Unable to obtain  Medications:  Antibiotics Given (last 72 hours)    Date/Time Action Medication Dose Rate   02/05/16 1006 Given   cefTRIAXone (ROCEPHIN) IVPB 2 g 2 g 100 mL/hr   02/06/16 0848 Given   cefTRIAXone (ROCEPHIN) IVPB 2 g 2 g 100 mL/hr   02/07/16 1004 Given   cefTRIAXone (ROCEPHIN) IVPB 2 g 2 g 100 mL/hr     . cefTRIAXone  2 g Intravenous Daily  . feeding supplement (ENSURE ENLIVE)  237 mL Oral TID WC  . gabapentin  300 mg Oral QHS  . insulin aspart  0-15 Units Subcutaneous TID WC  . insulin aspart  0-5 Units Subcutaneous QHS  . insulin glargine  12 Units Subcutaneous QHS  . mouth rinse  15 mL Mouth Rinse BID  . sodium chloride flush  3 mL Intravenous Q12H    Objective: Vital signs in last 24 hours: Temp:  [97.6 F (36.4 C)-98.2 F (36.8 C)] 97.9 F (36.6 C) (01/29 0930) Pulse Rate:  [71-86] 84 (01/29 0930) Resp:  [20] 20 (01/29 0930) BP: (124-151)/(46-84) 136/84 (01/29 0930) SpO2:  [91 %-97 %] 91 % (01/29 0930) Constitutional:  Frail, chronicaly ill appearing HENT: /AT, PERRLA, no scleral icterus Mouth/Throat: Oropharynx is clear and moist . No oropharyngeal exudate.  Cardiovascular: reg Pulmonary/Chest: Effort normal and breath sounds normal. No respiratory distress.  has no wheezes.  Neck = supple, no nuchal rigidity  Abdominal: Soft. Mild distention Lymphadenopathy: no cervical adenopathy.  Neurological:more awake and interactive Skin: Skin is warm and dry. No rash noted. No erythema.  Psychiatric: more awake , mood nml Ext 2+ edmea bil UE LE  Lab Results  Recent Labs  02/05/16 0809 02/06/16 0649  WBC 32.6* 16.9*  HGB 11.8* 11.0*  HCT 36.0  33.5*  NA 144 143  K 5.1 3.6  CL 109 104  CO2 25 29  BUN 69* 66*  CREATININE 2.47* 2.61*    Microbiology: Results for orders placed or performed during the hospital encounter of 01/30/16  Urine culture     Status: Abnormal   Collection Time: 01/30/16  4:17 PM  Result Value Ref Range Status   Specimen Description URINE, RANDOM  Final   Special Requests NONE  Final   Culture >=100,000 COLONIES/mL KLEBSIELLA SPECIES (A)  Final   Report Status 02/02/2016 FINAL  Final   Organism ID, Bacteria KLEBSIELLA SPECIES (A)  Final      Susceptibility   Klebsiella species - MIC*    AMPICILLIN 16 RESISTANT Resistant     CEFAZOLIN <=4 SENSITIVE Sensitive     CEFTRIAXONE <=1 SENSITIVE Sensitive     CIPROFLOXACIN <=0.25 SENSITIVE Sensitive     GENTAMICIN <=1 SENSITIVE Sensitive     IMIPENEM <=0.25 SENSITIVE Sensitive     NITROFURANTOIN 64 INTERMEDIATE Intermediate     TRIMETH/SULFA <=20 SENSITIVE Sensitive     AMPICILLIN/SULBACTAM 4 SENSITIVE Sensitive     PIP/TAZO <=4 SENSITIVE Sensitive     Extended ESBL NEGATIVE Sensitive     * >=100,000 COLONIES/mL KLEBSIELLA SPECIES  Blood Culture (routine x 2)     Status: Abnormal   Collection Time: 01/30/16  5:21  PM  Result Value Ref Range Status   Specimen Description BLOOD RIGHT ANTECUBITAL  Final   Special Requests BOTTLES DRAWN AEROBIC AND ANAEROBIC 10CCAER,9CCANA  Final   Culture  Setup Time   Final    GRAM NEGATIVE RODS IN BOTH AEROBIC AND ANAEROBIC BOTTLES CRITICAL RESULT CALLED TO, READ BACK BY AND VERIFIED WITH: CHRISTINE KATSOUDAS ON 01/31/16 AT 1025 QSD Performed at [facility], [place] [address] [place], [place] [address]    Culture PROTEUS MIRABILIS (A)  Final   Report Status 02/02/2016 FINAL  Final   Organism ID, Bacteria PROTEUS MIRABILIS  Final      Susceptibility   Proteus mirabilis - MIC*    AMPICILLIN <=2 SENSITIVE Sensitive     CEFAZOLIN <=4 SENSITIVE Sensitive     CEFEPIME <=1 SENSITIVE Sensitive     CEFTAZIDIME <=1  SENSITIVE Sensitive     CEFTRIAXONE <=1 SENSITIVE Sensitive     CIPROFLOXACIN >=4 RESISTANT Resistant     GENTAMICIN 8 INTERMEDIATE Intermediate     IMIPENEM 4 SENSITIVE Sensitive     TRIMETH/SULFA >=320 RESISTANT Resistant     AMPICILLIN/SULBACTAM <=2 SENSITIVE Sensitive     PIP/TAZO <=4 SENSITIVE Sensitive     * PROTEUS MIRABILIS  Blood Culture ID Panel (Reflexed)     Status: Abnormal   Collection Time: 01/30/16  5:21 PM  Result Value Ref Range Status   Enterococcus species NOT DETECTED NOT DETECTED Final   Listeria monocytogenes NOT DETECTED NOT DETECTED Final   Staphylococcus species NOT DETECTED NOT DETECTED Final   Staphylococcus aureus NOT DETECTED NOT DETECTED Final   Streptococcus species NOT DETECTED NOT DETECTED Final   Streptococcus agalactiae NOT DETECTED NOT DETECTED Final   Streptococcus pneumoniae NOT DETECTED NOT DETECTED Final   Streptococcus pyogenes NOT DETECTED NOT DETECTED Final   Acinetobacter baumannii NOT DETECTED NOT DETECTED Final   Enterobacteriaceae species DETECTED (A) NOT DETECTED Final    Comment: CRITICAL RESULT CALLED TO, READ BACK BY AND VERIFIED WITH: CHRISTINE KATSOUDAS ON 01/31/16 AT 1025 QSD    Enterobacter cloacae complex NOT DETECTED NOT DETECTED Final   Escherichia coli NOT DETECTED NOT DETECTED Final   Klebsiella oxytoca NOT DETECTED NOT DETECTED Final   Klebsiella pneumoniae NOT DETECTED NOT DETECTED Final   Proteus species DETECTED (A) NOT DETECTED Final    Comment: CRITICAL RESULT CALLED TO, READ BACK BY AND VERIFIED WITH: CHRISTINE KATSOUDAS ON 01/31/16 AT 1025 QSD    Serratia marcescens NOT DETECTED NOT DETECTED Final   Carbapenem resistance NOT DETECTED NOT DETECTED Final   Haemophilus influenzae NOT DETECTED NOT DETECTED Final   Neisseria meningitidis NOT DETECTED NOT DETECTED Final   Pseudomonas aeruginosa NOT DETECTED NOT DETECTED Final   Candida albicans NOT DETECTED NOT DETECTED Final   Candida glabrata NOT DETECTED NOT  DETECTED Final   Candida krusei NOT DETECTED NOT DETECTED Final   Candida parapsilosis NOT DETECTED NOT DETECTED Final   Candida tropicalis NOT DETECTED NOT DETECTED Final  Blood Culture (routine x 2)     Status: Abnormal   Collection Time: 01/30/16  5:25 PM  Result Value Ref Range Status   Specimen Description BLOOD LEFT HAND  Final   Special Requests   Final    BOTTLES DRAWN AEROBIC AND ANAEROBIC 10CCAERO,10CCANA   Culture  Setup Time   Final    GRAM NEGATIVE RODS IN BOTH AEROBIC AND ANAEROBIC BOTTLES CRITICAL RESULT CALLED TO, READ BACK BY AND VERIFIED WITH: CHRISTINE KATSOUDAS ON 01/31/16 AT 1025 QSD  Culture (A)  Final    PROTEUS MIRABILIS SUSCEPTIBILITIES PERFORMED ON PREVIOUS CULTURE WITHIN THE LAST 5 DAYS. Performed at [facility], [place] [address], [place], [place] [address]    Report Status 02/02/2016 FINAL  Final  MRSA PCR Screening     Status: None   Collection Time: 01/31/16  5:47 AM  Result Value Ref Range Status   MRSA by PCR NEGATIVE NEGATIVE Final    Comment:        The GeneXpert MRSA Assay (FDA approved for NASAL specimens only), is one component of a comprehensive MRSA colonization surveillance program. It is not intended to diagnose MRSA infection nor to guide or monitor treatment for MRSA infections.   C difficile quick scan w PCR reflex     Status: None   Collection Time: 01/31/16  7:33 AM  Result Value Ref Range Status   C Diff antigen NEGATIVE NEGATIVE Final   C Diff toxin NEGATIVE NEGATIVE Final   C Diff interpretation No C. difficile detected.  Final  CULTURE, BLOOD (ROUTINE X 2) w Reflex to ID Panel     Status: None (Preliminary result)   Collection Time: 02/03/16 11:51 AM  Result Value Ref Range Status   Specimen Description BLOOD  LEFT AC  Final   Special Requests   Final    BOTTLES DRAWN AEROBIC AND ANAEROBIC ARE 8 ML ANA 7 ML   Culture NO GROWTH 4 DAYS  Final   Report Status PENDING  Incomplete    Studies/Results: No results  found.  Assessment/Plan: Sherry [name] is a 83 y.o. female with proteus bacteremia of unknown source - UA unimpressive but UCX with > 100 K Klebsiella , CXR neg. Day 9 of abx  Recommendations Would rec a 14 day total abx - can change to keflex 500 bid for 4-5 more days  Thank you very much for the consult.   Nyrah Demos P   02/07/2016, 1:30 PM

## 2016-02-07 NOTE — Progress Notes (Signed)
Awaiting EMS transport. Hand-off report given to Robet Leu, RN whom is assuming care until discharge to [facility]. Madlyn Frankel, RN

## 2016-02-07 NOTE — Care Management Important Message (Addendum)
Important Message  Patient Details  Name: Sherry [name] MRN: [id number] Date of Birth: 06-02-1927   Medicare Important Message Given:  Yes    Shelbie Ammons, RN 02/07/2016, 10:28 AM

## 2016-02-07 NOTE — Progress Notes (Addendum)
2:45 PM LCSW following for disposition and placement. Bed choice:  [facility]. [facility] Contacted:  Aware of discharge and agreeable to accept for admission today. Family aware of plan. Patient will transport by EMS.  Room 508  Report:  [id number] To Theressa Stamps   LCSW called and spoke with daughter via phone [name]) regarding any choice with SNF. Daughter continues to report, no choice yet. She will be visiting all facilities today and will call back this evening or first thing in the morning for bed choice.  Discussed palliative care recommendations for palliative at the facility in which [name] was in agreement and LCSW updated FL2 to reflect recommendation.  Will await to hear from daughter regarding choice.  Plan: SNF with palliative at [place].  Pending choice.  Lane Hacker, MSW Clinical Social Work: [employer] for :  [phone]

## 2016-02-07 NOTE — Clinical Social Work Placement (Signed)
   CLINICAL SOCIAL WORK PLACEMENT  NOTE  Date:  02/07/2016  Patient Details  Name: Sherry [name] MRN: [id number] Date of Birth: Dec 06, 1927  Clinical Social Work is seeking post-discharge placement for this patient at the [place] level of care (*CSW will initial, date and re-position this form in  chart as items are completed):  Yes   Patient/family provided with [place] [facility] Work Department's list of facilities offering this level of care within the geographic area requested by the patient (or if unable, by the patient's family).  Yes   Patient/family informed of their freedom to choose among providers that offer the needed level of care, that participate in Medicare, Medicaid or managed care program needed by the patient, have an available bed and are willing to accept the patient.  Yes   Patient/family informed of [facility]'s ownership interest in [facility] and [facility], as well as of the fact that they are under no obligation to receive care at these facilities.  PASRR submitted to EDS on 02/01/16     PASRR number received on 02/01/16     Existing PASRR number confirmed on       FL2 transmitted to all facilities in geographic area requested by pt/family on 02/01/16     FL2 transmitted to all facilities within larger geographic area on       Patient informed that his/her managed care company has contracts with or will negotiate with certain facilities, including the following:            Patient/family informed of bed offers received.  02/07/2016   Patient chooses bed at     [facility]  Physician recommends and patient chooses bed at      SNF Patient to be transferred to   on  .  02/07/2016   Patient to be transferred to facility by       EMS  Patient family notified on   of transfer.  Daughter [name]  Name of family member notified:        PHYSICIAN       Additional Comment:     _______________________________________________ Lilly Cove, LCSW 02/07/2016, 2:49 PM

## 2016-02-07 NOTE — Progress Notes (Signed)
EMS called for transport to [facility]. Asuncion Shibata S, RN  

## 2016-02-08 LAB — CULTURE, BLOOD (ROUTINE X 2): CULTURE: NO GROWTH

## 2016-02-26 ENCOUNTER — Emergency Department (HOSPITAL_COMMUNITY): Payer: Medicare Other

## 2016-02-26 ENCOUNTER — Encounter (HOSPITAL_COMMUNITY): Payer: Self-pay | Admitting: Emergency Medicine

## 2016-02-26 ENCOUNTER — Inpatient Hospital Stay (HOSPITAL_COMMUNITY)
Admission: EM | Admit: 2016-02-26 | Discharge: 2016-03-01 | DRG: 176 | Disposition: A | Discharge status: Home Health | Payer: Medicare Other | Attending: Internal Medicine | Admitting: Internal Medicine

## 2016-02-26 DIAGNOSIS — I2699 Other pulmonary embolism without acute cor pulmonale: Principal | ICD-10-CM | POA: Diagnosis present

## 2016-02-26 DIAGNOSIS — E1122 Type 2 diabetes mellitus with diabetic chronic kidney disease: Secondary | ICD-10-CM | POA: Diagnosis present

## 2016-02-26 DIAGNOSIS — N183 Chronic kidney disease, stage 3 unspecified: Secondary | ICD-10-CM | POA: Diagnosis present

## 2016-02-26 DIAGNOSIS — I82812 Embolism and thrombosis of superficial veins of left lower extremities: Secondary | ICD-10-CM | POA: Diagnosis present

## 2016-02-26 DIAGNOSIS — R601 Generalized edema: Secondary | ICD-10-CM | POA: Diagnosis present

## 2016-02-26 DIAGNOSIS — E785 Hyperlipidemia, unspecified: Secondary | ICD-10-CM | POA: Diagnosis present

## 2016-02-26 DIAGNOSIS — E11649 Type 2 diabetes mellitus with hypoglycemia without coma: Secondary | ICD-10-CM | POA: Diagnosis present

## 2016-02-26 DIAGNOSIS — Z79899 Other long term (current) drug therapy: Secondary | ICD-10-CM

## 2016-02-26 DIAGNOSIS — Z794 Long term (current) use of insulin: Secondary | ICD-10-CM

## 2016-02-26 DIAGNOSIS — I10 Essential (primary) hypertension: Secondary | ICD-10-CM | POA: Diagnosis present

## 2016-02-26 DIAGNOSIS — I129 Hypertensive chronic kidney disease with stage 1 through stage 4 chronic kidney disease, or unspecified chronic kidney disease: Secondary | ICD-10-CM | POA: Diagnosis present

## 2016-02-26 DIAGNOSIS — I447 Left bundle-branch block, unspecified: Secondary | ICD-10-CM | POA: Diagnosis present

## 2016-02-26 DIAGNOSIS — J90 Pleural effusion, not elsewhere classified: Secondary | ICD-10-CM | POA: Diagnosis present

## 2016-02-26 DIAGNOSIS — Z981 Arthrodesis status: Secondary | ICD-10-CM

## 2016-02-26 DIAGNOSIS — Z66 Do not resuscitate: Secondary | ICD-10-CM | POA: Diagnosis present

## 2016-02-26 DIAGNOSIS — I82413 Acute embolism and thrombosis of femoral vein, bilateral: Secondary | ICD-10-CM | POA: Diagnosis present

## 2016-02-26 DIAGNOSIS — D649 Anemia, unspecified: Secondary | ICD-10-CM | POA: Diagnosis not present

## 2016-02-26 DIAGNOSIS — D500 Iron deficiency anemia secondary to blood loss (chronic): Secondary | ICD-10-CM | POA: Diagnosis present

## 2016-02-26 DIAGNOSIS — I370 Nonrheumatic pulmonary valve stenosis: Secondary | ICD-10-CM | POA: Diagnosis not present

## 2016-02-26 DIAGNOSIS — E119 Type 2 diabetes mellitus without complications: Secondary | ICD-10-CM

## 2016-02-26 DIAGNOSIS — R0602 Shortness of breath: Secondary | ICD-10-CM | POA: Diagnosis present

## 2016-02-26 LAB — URINALYSIS, ROUTINE W REFLEX MICROSCOPIC
Bilirubin Urine: NEGATIVE
GLUCOSE, UA: 50 mg/dL — AB
KETONES UR: NEGATIVE mg/dL
NITRITE: NEGATIVE
PROTEIN: 30 mg/dL — AB
Specific Gravity, Urine: 1.011 (ref 1.005–1.030)
pH: 6 (ref 5.0–8.0)

## 2016-02-26 LAB — BASIC METABOLIC PANEL
ANION GAP: 11 (ref 5–15)
BUN: 22 mg/dL — ABNORMAL HIGH (ref 6–20)
CHLORIDE: 91 mmol/L — AB (ref 101–111)
CO2: 36 mmol/L — AB (ref 22–32)
Calcium: 8.4 mg/dL — ABNORMAL LOW (ref 8.9–10.3)
Creatinine, Ser: 1.33 mg/dL — ABNORMAL HIGH (ref 0.44–1.00)
GFR calc non Af Amer: 35 mL/min — ABNORMAL LOW (ref >60)
GFR, EST AFRICAN AMERICAN: 41 mL/min — AB (ref >60)
GLUCOSE: 106 mg/dL — AB (ref 65–99)
Potassium: 3.2 mmol/L — ABNORMAL LOW (ref 3.5–5.1)
Sodium: 138 mmol/L (ref 135–145)

## 2016-02-26 LAB — TROPONIN I: Troponin I: <0.03 ng/mL (ref <0.03)

## 2016-02-26 LAB — PROTIME-INR
INR: 0.95
Prothrombin Time: 12.7 seconds (ref 11.4–15.2)

## 2016-02-26 LAB — GLUCOSE, CAPILLARY
GLUCOSE-CAPILLARY: 49 mg/dL — AB (ref 65–99)
Glucose-Capillary: 111 mg/dL — ABNORMAL HIGH (ref 65–99)
Glucose-Capillary: 72 mg/dL (ref 65–99)

## 2016-02-26 LAB — CBC
HEMATOCRIT: 25.4 % — AB (ref 36.0–46.0)
HEMOGLOBIN: 8 g/dL — AB (ref 12.0–15.0)
MCH: 27.7 pg (ref 26.0–34.0)
MCHC: 31.5 g/dL (ref 30.0–36.0)
MCV: 87.9 fL (ref 78.0–100.0)
Platelets: 343 10*3/uL (ref 150–400)
RBC: 2.89 MIL/uL — AB (ref 3.87–5.11)
RDW: 13.8 % (ref 11.5–15.5)
WBC: 12.7 10*3/uL — ABNORMAL HIGH (ref 4.0–10.5)

## 2016-02-26 LAB — D-DIMER, QUANTITATIVE (NOT AT ARMC): D DIMER QUANT: 4.62 ug{FEU}/mL — AB (ref 0.00–0.50)

## 2016-02-26 MED ORDER — FERROUS SULFATE 325 (65 FE) MG PO TABS
325.0000 mg | ORAL_TABLET | Freq: Every day | ORAL | Status: DC
  Administered 2016-02-27: 325 mg via ORAL
  Filled 2016-02-26: qty 1

## 2016-02-26 MED ORDER — IOPAMIDOL (ISOVUE-370) INJECTION 76%
75.0000 mL | Freq: Once | INTRAVENOUS | Status: AC | PRN
Start: 2016-02-26 — End: 2016-02-26
  Administered 2016-02-26: 75 mL via INTRAVENOUS

## 2016-02-26 MED ORDER — INSULIN ASPART 100 UNIT/ML ~~LOC~~ SOLN
0.0000 [IU] | Freq: Three times a day (TID) | SUBCUTANEOUS | Status: DC
  Administered 2016-02-28: 2 [IU] via SUBCUTANEOUS
  Administered 2016-02-28: 1 [IU] via SUBCUTANEOUS
  Administered 2016-02-29: 3 [IU] via SUBCUTANEOUS
  Administered 2016-02-29: 2 [IU] via SUBCUTANEOUS
  Administered 2016-03-01: 3 [IU] via SUBCUTANEOUS
  Administered 2016-03-01 (×2): 2 [IU] via SUBCUTANEOUS

## 2016-02-26 MED ORDER — INSULIN DETEMIR 100 UNIT/ML ~~LOC~~ SOLN
6.0000 [IU] | Freq: Every morning | SUBCUTANEOUS | Status: DC
Start: 2016-02-27 — End: 2016-02-26
  Filled 2016-02-26 (×2): qty 0.06

## 2016-02-26 MED ORDER — SODIUM CHLORIDE 0.9 % IV SOLN
INTRAVENOUS | Status: DC
  Administered 2016-02-26: 22:00:00 via INTRAVENOUS

## 2016-02-26 MED ORDER — PANTOPRAZOLE SODIUM 40 MG IV SOLR
40.0000 mg | Freq: Two times a day (BID) | INTRAVENOUS | Status: DC
  Administered 2016-02-26 – 2016-02-27 (×3): 40 mg via INTRAVENOUS
  Filled 2016-02-26 (×3): qty 40

## 2016-02-26 MED ORDER — HEPARIN BOLUS VIA INFUSION
4000.0000 [IU] | Freq: Once | INTRAVENOUS | Status: AC
  Administered 2016-02-26: 4000 [IU] via INTRAVENOUS

## 2016-02-26 MED ORDER — VANCOMYCIN HCL IN DEXTROSE 750-5 MG/150ML-% IV SOLN
750.0000 mg | INTRAVENOUS | Status: DC
  Filled 2016-02-26: qty 150

## 2016-02-26 MED ORDER — HEPARIN (PORCINE) IN NACL 100-0.45 UNIT/ML-% IJ SOLN
1200.0000 [IU]/h | INTRAMUSCULAR | Status: DC
  Administered 2016-02-26: 1100 [IU]/h via INTRAVENOUS
  Administered 2016-02-27: 1000 [IU]/h via INTRAVENOUS
  Administered 2016-02-28 – 2016-02-29 (×2): 1100 [IU]/h via INTRAVENOUS
  Filled 2016-02-26 (×4): qty 250

## 2016-02-26 MED ORDER — SODIUM CHLORIDE 0.9% FLUSH
3.0000 mL | Freq: Two times a day (BID) | INTRAVENOUS | Status: DC
  Administered 2016-02-26: 23:00:00 via INTRAVENOUS
  Administered 2016-02-27 – 2016-03-01 (×6): 3 mL via INTRAVENOUS

## 2016-02-26 MED ORDER — ACETAMINOPHEN 325 MG PO TABS: 650.0000 mg | ORAL_TABLET | Freq: Four times a day (QID) | ORAL | Status: DC | PRN

## 2016-02-26 MED ORDER — DEXTROSE 50 % IV SOLN
INTRAVENOUS | Status: AC
  Administered 2016-02-26: 25 mL via INTRAVENOUS
  Filled 2016-02-26: qty 50

## 2016-02-26 MED ORDER — ONDANSETRON HCL 4 MG/2ML IJ SOLN
4.0000 mg | Freq: Four times a day (QID) | INTRAMUSCULAR | Status: DC | PRN
  Administered 2016-02-28: 4 mg via INTRAVENOUS
  Filled 2016-02-26: qty 2

## 2016-02-26 MED ORDER — ATORVASTATIN CALCIUM 20 MG PO TABS
20.0000 mg | ORAL_TABLET | Freq: Every day | ORAL | Status: DC
  Administered 2016-02-26 – 2016-02-29 (×4): 20 mg via ORAL
  Filled 2016-02-26 (×4): qty 1

## 2016-02-26 MED ORDER — ACETAMINOPHEN 650 MG RE SUPP: 650.0000 mg | Freq: Four times a day (QID) | RECTAL | Status: DC | PRN

## 2016-02-26 MED ORDER — ONDANSETRON HCL 4 MG PO TABS: 4.0000 mg | ORAL_TABLET | Freq: Four times a day (QID) | ORAL | Status: DC | PRN

## 2016-02-26 MED ORDER — DEXTROSE 5 % IV SOLN
1.0000 g | Freq: Once | INTRAVENOUS | Status: AC
  Administered 2016-02-26: 1 g via INTRAVENOUS
  Filled 2016-02-26: qty 1

## 2016-02-26 MED ORDER — VANCOMYCIN HCL 10 G IV SOLR
INTRAVENOUS | Status: AC
  Filled 2016-02-26: qty 1500

## 2016-02-26 MED ORDER — GABAPENTIN 300 MG PO CAPS
300.0000 mg | ORAL_CAPSULE | Freq: Every day | ORAL | Status: DC
  Administered 2016-02-26 – 2016-02-29 (×4): 300 mg via ORAL
  Filled 2016-02-26 (×4): qty 1

## 2016-02-26 MED ORDER — VANCOMYCIN HCL 10 G IV SOLR
1500.0000 mg | Freq: Once | INTRAVENOUS | Status: AC
  Administered 2016-02-26: 1500 mg via INTRAVENOUS
  Filled 2016-02-26: qty 1500

## 2016-02-26 NOTE — ED Notes (Addendum)
Hemoccult positive per Dr Hillard Danker

## 2016-02-26 NOTE — ED Notes (Signed)
Sherry [name], [facility] notified of need for 1500 mg of Vancomycin.

## 2016-02-26 NOTE — Progress Notes (Signed)
ANTICOAGULATION CONSULT NOTE - Initial Consult  Pharmacy Consult for Heparin Indication: pulmonary embolus  No Known Allergies  Patient Measurements: Height: 5\' 3"  (160 cm) Weight: 166 lb (75.3 kg) IBW/kg (Calculated) : 52.4 Heparin Dosing Weight: 68.5 kg  Vital Signs: Temp: 98.6 F (37 C) (02/17 1543) Temp Source: Oral (02/17 1543) BP: 136/69 (02/17 1900) Pulse Rate: 99 (02/17 1900)  Labs:  Recent Labs  02/26/16 1634  HGB 8.0*  HCT 25.4*  PLT 343  CREATININE 1.33*  TROPONINI <0.03    Estimated Creatinine Clearance: 29.5 mL/min (by C-G formula based on SCr of 1.33 mg/dL (H)).   Medical History: Past Medical History:  Diagnosis Date  . Diabetes mellitus without complication ([place])   . Hyperlipidemia   . Hypertension     Medications:   (Not in a hospital admission)  Assessment: 83 yo female ED patient. Findings compatible with acute pulmonary embolus within the right lower lobe, left upper lobe and left lower lobe pulmonary arteries. Labs reviewed PTA medications reviewed  Goal of Therapy:  Heparin level 0.3-0.7 units/ml Monitor platelets by anticoagulation protocol: Yes   Plan:  Heparin 4000 unit IV bolus, then heparin infusion 1100 units/hour Heparin level in 8 hours Monitor CBC, platelets  Abner Greenspan, Josue Falconi Bennett 02/26/2016,7:47 PM

## 2016-02-26 NOTE — ED Notes (Signed)
2 attempts for 2nd IV access with no success. Rip Harbour, charge nurse notified.

## 2016-02-26 NOTE — ED Notes (Signed)
Dr. Yates at bedside.  

## 2016-02-26 NOTE — ED Notes (Addendum)
Okay to start heparin bolus and drip with positive hemoccult and decreased Hbg per Dr Tomi Bamberger and Dr Lorin Mercy (admitting MD).

## 2016-02-26 NOTE — ED Notes (Signed)
Pt oxygen saturation was 58% on room air.  Switched pt to nasal cannula 4 LPM oxygenation increased to 95%.

## 2016-02-26 NOTE — ED Triage Notes (Signed)
Per EMS just released from [facility] in [place].  When family was bringing her to their residence, discharged and went home she became SOB, pale, and weak.  Heart rate was irregular.  Previously admitted to [facility] to ICU for UTI.  She stays with her son and was found in fecal matter and urine prior to being admitted to [facility].  Pt is oriented, but pale and fatigued.    CBG 127 BP 150/70

## 2016-02-26 NOTE — ED Notes (Signed)
Pt is resting comfortably and orthopneic, oxygenation is 80 % on 4 LPM.  Placed pt on non-rebreather and oxygenation increased to 97 %.

## 2016-02-26 NOTE — ED Provider Notes (Addendum)
[facility] DEPT Provider Note   CSN: [id number] Arrival date & time: 02/26/16  1543     History   Chief Complaint Chief Complaint  Patient presents with  . Shortness of Breath    HPI Sherry [name] is a 83 y.o. female.  HPI The patient was recently admitted to the hospital the end of last month for nausea, vomiting, diarrhea and sepsis. Patient was released from the facility on January 29 and discharged to a nursing facility.  She states she was recovering at the nursing facility. She was either discharged today or asked to go home today. She states she was able to walk some at the nursing facility. There are electronic notes indicating she was last seen on February 15 at the nursing facility.  They were continuing to work with her on strengthening and conditioning. They are trying to wean her off oxygen as tolerated. Patient states she was discharged from the nursing facility today. As she was exiting the car to walk back into her home she was unable to walk up to 2 steps. She became too weak and short of breath. EMS was called and she was transferred to the emergency room here.   Initially the patient denied any chest pain or shortness of breath but when I went back to speak with her again a few minutes later she said that she started having mild pain in her chest on the ambulance ride to the ED.   She has some discomfort right now. Past Medical History:  Diagnosis Date  . Diabetes mellitus without complication ([place])   . Hyperlipidemia   . Hypertension     Patient Active Problem List   Diagnosis Date Noted  . Goals of care, counseling/discussion   . Adult failure to thrive syndrome   . DNR (do not resuscitate)   . Palliative care by specialist   . Sepsis ([place]) 11/26/2014    Past Surgical History:  Procedure Laterality Date  . BACK SURGERY    . SHOULDER SURGERY      OB History    No data available       Home Medications    Prior to Admission medications     Medication Sig Start Date End Date Taking? Authorizing Provider  acetaminophen (TYLENOL) 325 MG tablet Take 650 mg by mouth every 4 (four) hours as needed for mild pain or moderate pain.   Yes Historical Provider, MD  atorvastatin (LIPITOR) 20 MG tablet Take 1 tablet by mouth at bedtime.   Yes Historical Provider, MD  ferrous sulfate 325 (65 FE) MG tablet Take 325 mg by mouth daily with breakfast.   Yes Historical Provider, MD  gabapentin (NEURONTIN) 300 MG capsule Take 1 capsule by mouth at bedtime.   Yes Historical Provider, MD  glipiZIDE (GLUCOTROL XL) 10 MG 24 hr tablet Take 1 tablet by mouth daily.   Yes Historical Provider, MD  ibuprofen (ADVIL,MOTRIN) 200 MG tablet Take 200 mg by mouth every 6 (six) hours as needed for mild pain or moderate pain.   Yes Historical Provider, MD  insulin detemir (LEVEMIR) 100 UNIT/ML injection Inject 13 Units into the skin every morning.   Yes Historical Provider, MD  ondansetron (ZOFRAN) 4 MG tablet Take 4 mg by mouth 2 (two) times daily. *May take every 6 hours as needed for nausea and vomiting   Yes Historical Provider, MD  polyethylene glycol powder (GLYCOLAX/MIRALAX) powder Take 17 g by mouth every evening.   Yes Historical Provider, MD  cephALEXin (  KEFLEX) 500 MG capsule Take 1 capsule (500 mg total) by mouth every 12 (twelve) hours. Patient not taking: Reported on 02/26/2016 02/08/16   Demetrios Loll, MD  Elastic Bandages & Supports (T.E.D. KNEE LENGTH/M-LONG) MISC 2 application by Does not apply route every morning. 11/05/15   Merlyn Lot, MD  feeding supplement, ENSURE ENLIVE, (ENSURE ENLIVE) LIQD Take 237 mLs by mouth 3 (three) times daily with meals. 02/07/16   Demetrios Loll, MD    Family History No family history on file.  Social History Social History  Substance Use Topics  . Smoking status: Never Smoker  . Smokeless tobacco: Never Used  . Alcohol use No     Allergies   Patient has no known allergies.   Review of Systems Review of Systems   All other systems reviewed and are negative.    Physical Exam Updated Vital Signs BP 136/69   Pulse 99   Temp 98.6 F (37 C) (Oral)   Resp 22   Ht 5\' 3"  (1.6 m)   Wt 75.3 kg   SpO2 100%   BMI 29.41 kg/m   Physical Exam  Constitutional: No distress.  HENT:  Head: Normocephalic and atraumatic.  Right Ear: External ear normal.  Left Ear: External ear normal.  Eyes: Conjunctivae are normal. Right eye exhibits no discharge. Left eye exhibits no discharge. No scleral icterus.  Neck: Neck supple. No tracheal deviation present.  Cardiovascular: Normal rate, regular rhythm and intact distal pulses.   Pulmonary/Chest: Effort normal and breath sounds normal. No stridor. No respiratory distress. She has no wheezes. She has no rales.  Abdominal: Soft. Bowel sounds are normal. She exhibits no distension. There is no tenderness. There is no rebound and no guarding.  Musculoskeletal: She exhibits edema. She exhibits no tenderness.  Neurological: She is alert. No cranial nerve deficit (no facial droop, extraocular movements intact, no slurred speech) or sensory deficit. She exhibits normal muscle tone. She displays no seizure activity.  Generalized weakness  Skin: Skin is warm and dry. No rash noted. She is not diaphoretic. There is pallor.  Psychiatric: She has a normal mood and affect.  Nursing note and vitals reviewed.    ED Treatments / Results  Labs (all labs ordered are listed, but only abnormal results are displayed) Labs Reviewed  BASIC METABOLIC PANEL - Abnormal; Notable for the following:       Result Value   Potassium 3.2 (*)    Chloride 91 (*)    CO2 36 (*)    Glucose, Bld 106 (*)    BUN 22 (*)    Creatinine, Ser 1.33 (*)    Calcium 8.4 (*)    GFR calc non Af Amer 35 (*)    GFR calc Af Amer 41 (*)    All other components within normal limits  CBC - Abnormal; Notable for the following:    WBC 12.7 (*)    RBC 2.89 (*)    Hemoglobin 8.0 (*)    HCT 25.4 (*)    All  other components within normal limits  D-DIMER, QUANTITATIVE (NOT AT [facility]) - Abnormal; Notable for the following:    D-Dimer, Quant 4.62 (*)    All other components within normal limits  TROPONIN I  URINALYSIS, ROUTINE W REFLEX MICROSCOPIC  BASIC METABOLIC PANEL  PROTIME-INR  POC OCCULT BLOOD, ED  TYPE AND SCREEN    EKG  EKG Interpretation  Date/Time:  Saturday February 26 2016 15:45:50 EST Ventricular Rate:  96 PR Interval:  QRS Duration: 147 QT Interval:  398 QTC Calculation: 503 R Axis:   40 Text Interpretation:  Sinus or ectopic atrial rhythm Left bundle branch block Since last tracing rate slower Confirmed by Alysa Duca  MD-J, Alexcia Schools [id number]) on 02/26/2016 3:58:24 PM       Radiology Dg Chest 2 View  Result Date: 02/26/2016 CLINICAL DATA:  Patient with weakness.  Shortness of breath. EXAM: CHEST  2 VIEW COMPARISON:  Chest radiograph 01/30/2016. FINDINGS: Anterior cervical spinal fusion hardware. Cardiomegaly. Monitoring leads overlie the patient. Patchy consolidation within the right mid and lower lung. Moderate right pleural effusion. Probable small left pleural effusion. IMPRESSION: Patchy consolidation within the right mid lower lung concerning for pneumonia in the appropriate clinical setting. Additional consideration would be atelectasis in the setting of pleural effusion. Moderate layering right pleural effusion. Probable small left pleural effusion. Electronically Signed   By: Lovey Newcomer M.D.   On: 02/26/2016 16:44   Ct Angio Chest Pe W And/or Wo Contrast  Result Date: 02/26/2016 CLINICAL DATA:  Patient with shortness of breath. Irregular heart rate. EXAM: CT ANGIOGRAPHY CHEST WITH CONTRAST TECHNIQUE: Multidetector CT imaging of the chest was performed using the standard protocol during bolus administration of intravenous contrast. Multiplanar CT image reconstructions and MIPs were obtained to evaluate the vascular anatomy. CONTRAST:  75 cc Isovue 370 COMPARISON:  Chest  radiograph 02/26/2016 FINDINGS: Cardiovascular: Heart is enlarged. Small pericardial effusion. Aorta and main pulmonary artery are normal in caliber. Filling defects are identified within the right lower lobe pulmonary arteries as well as left upper and left lower lobe pulmonary arteries. No CT evidence to suggest acute right heart strain. Coronary arterial vascular calcifications. Mediastinum/Nodes: No enlarged axillary, mediastinal or hilar lymphadenopathy. There is circumferential wall thickening of the mid and distal esophagus. Small hiatal hernia. Lungs/Pleura: Central airways are patent. Atelectasis of the right middle lobe. Consolidation within the right and left lower lobes. Moderate right and small left layering pleural effusions. No pneumothorax. Upper Abdomen: Liver is diffusely low in attenuation compatible with steatosis. Adrenal glands are normal. Musculoskeletal: Mid thoracic spine degenerative changes. No aggressive or acute appearing osseous lesions. Review of the MIP images confirms the above findings. IMPRESSION: Findings compatible with acute pulmonary embolus within the right lower lobe, left upper lobe and left lower lobe pulmonary arteries. No evidence for right heart strain. There is circumferential wall thickening of the mid and distal esophagus. Acute esophagitis is not excluded. Small hiatal hernia. Consolidation of the right middle lobe with subpleural consolidation in the right lower and left lower lobes which may represent atelectasis. Infection not excluded. Moderate right and small left layering pleural effusions. Hepatic steatosis. Aortic atherosclerosis. Critical Value/emergent results were called by telephone at the time of interpretation on 02/26/2016 at 7:22 pm to Dr. Dorie Rank , who verbally acknowledged these results. Electronically Signed   By: Lovey Newcomer M.D.   On: 02/26/2016 19:23    Procedures .Critical Care Performed by: Dorie Rank Authorized by: Dorie Rank    Critical care provider statement:    Critical care time (minutes):  45   Critical care was time spent personally by me on the following activities:  Discussions with consultants, evaluation of patient's response to treatment, examination of patient, ordering and performing treatments and interventions, ordering and review of laboratory studies, ordering and review of radiographic studies, pulse oximetry, re-evaluation of patient's condition, obtaining history from patient or surrogate and review of old charts    (including critical care time)  Medications Ordered  in ED Medications  ceFEPIme (MAXIPIME) 1 g in dextrose 5 % 50 mL IVPB (1 g Intravenous New Bag/Given 02/26/16 1942)  vancomycin (VANCOCIN) 1,500 mg in sodium chloride 0.9 % 500 mL IVPB (not administered)  vancomycin (VANCOCIN) IVPB 750 mg/150 ml premix (not administered)  heparin bolus via infusion 4,000 Units (not administered)  heparin ADULT infusion 100 units/mL (25000 units/237mL sodium chloride 0.45%) (not administered)  iopamidol (ISOVUE-370) 76 % injection 75 mL (75 mLs Intravenous Contrast Given 02/26/16 1833)     Initial Impression / Assessment and Plan / ED Course  I have reviewed the triage vital signs and the nursing notes.  Pertinent labs & imaging results that were available during my care of the patient were reviewed by me and considered in my medical decision making (see chart for details).  Clinical Course as of Feb 26 1948  Sat Feb 26, 2016  1758 Hgb dropped from 11 to 8  [JK]  1758 Hemoglobin: (!) 8.0 [JK]  1821 Hemmocult is positive, brown stool.  No melena or blood.  D Dimer is positive.  Will do a ct angio.  Will hold on heparin for now.  [id number] CXR with possible pna vs atelectasis.  Will start on abx pending ct chest  [JK]  1922 CT scan shows PE.  Pleural effusion.  NO sign of heart strain  [JK]  1923 Will start on heparin.  Will need to monitor her blood count and for any signs of GI bleeding.   Occult blood noted in the stool but no bright red blood.  No vomiting.  No signs of active gi bleed.  [JK]  1923 No family has been in the ED to update.  [JK]  1948 Discussed findings with patient and explained the danger of her having a PE, anemia and occult blood in her stool.  Explained this can be life threatening.  Discussed code status with patient.   She is DNR  [JK]    Clinical Course User Index [JK] Dorie Rank, MD   Patient presents to the emergency room with complaints of weakness after just being discharged from a nursing home. Her evaluation in the ED and fortunately reveals worsening anemia associated with Hemoccult positive stools. She also has a pulmonary embolism on CT scan.  Patient does not have any bright red blood in her stool or melena. I think we need to start her on the heparin but will need to very carefully monitor hemoglobin and for any signs of acute blood loss or intestinal bleeding.  She may need GI consultation.  I will consult the medical service for admission.   Final Clinical Impressions(s) / ED Diagnoses   Final diagnoses:  Other acute pulmonary embolism without acute cor pulmonale (HCC)  Anemia, unspecified type  Pleural effusion      Dorie Rank, MD 02/26/16 1929    Dorie Rank, MD 02/26/16 1950

## 2016-02-26 NOTE — Progress Notes (Signed)
Pharmacy Antibiotic Note  Sherry [name] is a 83 y.o. female admitted on 02/26/2016 with pneumonia.  Pharmacy has been consulted for Vancomycin dosing.  Plan: Vancomycin 1500 mg IV loading dose, then 750 mg Iv every 24 hours Vancomycin trough at steady state Labs per protocol  Height: 5\' 3"  (160 cm) Weight: 166 lb (75.3 kg) IBW/kg (Calculated) : 52.4  Temp (24hrs), Avg:98.6 F (37 C), Min:98.6 F (37 C), Max:98.6 F (37 C)   Recent Labs Lab 02/26/16 1634  WBC 12.7*  CREATININE 1.33*    Estimated Creatinine Clearance: 29.5 mL/min (by C-G formula based on SCr of 1.33 mg/dL (H)).    No Known Allergies  Antimicrobials this admission: Vancomycin 2/17 >>  Cefepime 2/17 >>     Thank you for allowing pharmacy to be a part of this patient's care.  Chriss Czar 02/26/2016 6:43 PM

## 2016-02-26 NOTE — H&P (Addendum)
History and Physical    Sherry [name] [id number] DOB: 1927/04/27 DOA: 02/26/2016  PCP: [facility] Consultants:  None Patient coming from: Home - lives with son, [name]; [place]: son, daughter - Dewaine Oats, does not know her number  Chief Complaint: SOB  HPI: Sherry [name] is a 83 y.o. female with medical history significant of DM, HLD, stage 3 CKD and HTN who was admitted to [facility] from 1/21-29/18 for septic shock resulting from Proteus bacteremia.  She required Levophed but was able to be weaned off this medication and improved. She was discharged to SNF at [facility].  No records from the SNF are available but the patient reports that she requested discharge to home today.  However, upon arriving at home she was unable to climb the steps when she got home, her legs wouldn't work.  Slight SOB.  Mild chest pain, substernal, with exertion.  Has not noticed blood in her stools.  No reported h/o anemia.  No h/o GI bleeding.  In the ER today, she was noted to be pale and was initially thought to have HCAP and so was given Cefepime and Vancomycin; her WBC count was improved.  When the blood work returned, she was found to be anemic and although she had soft brown stool, it was trace heme positive.  Meanwhile, qith her SOB a D-dimer was obtained and was positive.  Subsequent chest CT showed multiple pulmonary emboli.  Review of Systems: As per HPI; otherwise 10 point review of systems reviewed and negative.   Ambulatory Status:  Ambulates a few steps with walker  Past Medical History:  Diagnosis Date  . Diabetes mellitus without complication ([place])   . Hyperlipidemia   . Hypertension     Past Surgical History:  Procedure Laterality Date  . BACK SURGERY    . SHOULDER SURGERY      Social History   Social History  . Marital status: Single    Spouse name: N/A  . Number of children: N/A  . Years of education: N/A   Occupational  History  . Not on file.   Social History Main Topics  . Smoking status: Never Smoker  . Smokeless tobacco: Never Used  . Alcohol use No  . Drug use: No  . Sexual activity: Not on file   Other Topics Concern  . Not on file   Social History Narrative  . No narrative on file    No Known Allergies  History reviewed. No pertinent family history.  Prior to Admission medications   Medication Sig Start Date End Date Taking? Authorizing Provider  acetaminophen (TYLENOL) 325 MG tablet Take 650 mg by mouth every 4 (four) hours as needed for mild pain or moderate pain.   Yes Historical Provider, MD  atorvastatin (LIPITOR) 20 MG tablet Take 1 tablet by mouth at bedtime.   Yes Historical Provider, MD  ferrous sulfate 325 (65 FE) MG tablet Take 325 mg by mouth daily with breakfast.   Yes Historical Provider, MD  gabapentin (NEURONTIN) 300 MG capsule Take 1 capsule by mouth at bedtime.   Yes Historical Provider, MD  glipiZIDE (GLUCOTROL XL) 10 MG 24 hr tablet Take 1 tablet by mouth daily.   Yes Historical Provider, MD  ibuprofen (ADVIL,MOTRIN) 200 MG tablet Take 200 mg by mouth every 6 (six) hours as needed for mild pain or moderate pain.   Yes Historical Provider, MD  insulin detemir (LEVEMIR) 100 UNIT/ML injection Inject 13 Units  into the skin every morning.   Yes Historical Provider, MD  ondansetron (ZOFRAN) 4 MG tablet Take 4 mg by mouth 2 (two) times daily. *May take every 6 hours as needed for nausea and vomiting   Yes Historical Provider, MD  polyethylene glycol powder (GLYCOLAX/MIRALAX) powder Take 17 g by mouth every evening.   Yes Historical Provider, MD  cephALEXin (KEFLEX) 500 MG capsule Take 1 capsule (500 mg total) by mouth every 12 (twelve) hours. Patient not taking: Reported on 02/26/2016 02/08/16   Demetrios Loll, MD  Elastic Bandages & Supports (T.E.D. KNEE LENGTH/M-LONG) MISC 2 application by Does not apply route every morning. 11/05/15   Merlyn Lot, MD  feeding supplement,  ENSURE ENLIVE, (ENSURE ENLIVE) LIQD Take 237 mLs by mouth 3 (three) times daily with meals. 02/07/16   Demetrios Loll, MD    Physical Exam: Vitals:   02/26/16 1945 02/26/16 2030 02/26/16 2105 02/26/16 2147  BP: 141/61 146/58 (!) 135/50 (!) 150/54  Pulse: 96 92 94 96  Resp: 19 20 20  (!) 23  Temp:    98.2 F (36.8 C)  TempSrc:    Oral  SpO2: 100% 100% 100% 100%  Weight:    80.3 kg (177 lb 0.5 oz)  Height:    5\' 4"  (1.626 m)     General: Appears frail and extremely fatigued, wearing facemask O2 Eyes:  PERRL, EOMI, normal lids, iris; pale conjunctiva are noted ENT:  grossly normal hearing, lips & tongue, mmm Neck:  no LAD, masses or thyromegaly Cardiovascular:  RRR, no m/r/g. No LE edema.  Respiratory:  CTA bilaterally, no w/r/r. Normal respiratory effort. Abdomen:  soft, ntnd, NABS Skin:  no rash or induration seen on limited exam Musculoskeletal:  grossly normal tone BUE/BLE, good ROM, no bony abnormality Psychiatric:  grossly normal mood and affect, speech fluent and appropriate, AOx3 Neurologic:  CN 2-12 grossly intact, moves all extremities in coordinated fashion, sensation intact  Labs on Admission: I have personally reviewed following labs and imaging studies  CBC:  Recent Labs Lab 02/26/16 1634  WBC 12.7*  HGB 8.0*  HCT 25.4*  MCV 87.9  PLT [id number]   Basic Metabolic Panel:  Recent Labs Lab 02/26/16 1634  NA 138  K 3.2*  CL 91*  CO2 36*  GLUCOSE 106*  BUN 22*  CREATININE 1.33*  CALCIUM 8.4*   GFR: Estimated Creatinine Clearance: 31.1 mL/min (by C-G formula based on SCr of 1.33 mg/dL (H)). Liver Function Tests: No results for input(s): AST, ALT, ALKPHOS, BILITOT, PROT, ALBUMIN in the last 168 hours. No results for input(s): LIPASE, AMYLASE in the last 168 hours. No results for input(s): AMMONIA in the last 168 hours. Coagulation Profile:  Recent Labs Lab 02/26/16 1634  INR 0.95   Cardiac Enzymes:  Recent Labs Lab 02/26/16 1634  TROPONINI <0.03    BNP (last 3 results) No results for input(s): PROBNP in the last 8760 hours. HbA1C: No results for input(s): HGBA1C in the last 72 hours. CBG:  Recent Labs Lab 02/26/16 2246 02/26/16 2302 02/26/16 2350  GLUCAP 49* 111* 72   Lipid Profile: No results for input(s): CHOL, HDL, LDLCALC, TRIG, CHOLHDL, LDLDIRECT in the last 72 hours. Thyroid Function Tests: No results for input(s): TSH, T4TOTAL, FREET4, T3FREE, THYROIDAB in the last 72 hours. Anemia Panel: No results for input(s): VITAMINB12, FOLATE, FERRITIN, TIBC, IRON, RETICCTPCT in the last 72 hours. Urine analysis:    Component Value Date/Time   COLORURINE YELLOW 02/26/2016 2044   APPEARANCEUR HAZY (A) 02/26/2016 2044  APPEARANCEUR Clear 10/21/2012 1333   LABSPEC 1.011 02/26/2016 2044   LABSPEC 1.006 10/21/2012 1333   PHURINE 6.0 02/26/2016 2044   GLUCOSEU 50 (A) 02/26/2016 2044   GLUCOSEU 50 mg/dL 10/21/2012 1333   HGBUR SMALL (A) 02/26/2016 2044   BILIRUBINUR NEGATIVE 02/26/2016 2044   BILIRUBINUR Negative 10/21/2012 [place] 02/26/2016 2044   PROTEINUR 30 (A) 02/26/2016 2044   NITRITE NEGATIVE 02/26/2016 2044   LEUKOCYTESUR TRACE (A) 02/26/2016 2044   LEUKOCYTESUR Negative 10/21/2012 1333    Creatinine Clearance: Estimated Creatinine Clearance: 31.1 mL/min (by C-G formula based on SCr of 1.33 mg/dL (H)).  Sepsis Labs: @LABRCNTIP (procalcitonin:4,lacticidven:4) )No results found for this or any previous visit (from the past 240 hour(s)).   Radiological Exams on Admission: Dg Chest 2 View  Result Date: 02/26/2016 CLINICAL DATA:  Patient with weakness.  Shortness of breath. EXAM: CHEST  2 VIEW COMPARISON:  Chest radiograph 01/30/2016. FINDINGS: Anterior cervical spinal fusion hardware. Cardiomegaly. Monitoring leads overlie the patient. Patchy consolidation within the right mid and lower lung. Moderate right pleural effusion. Probable small left pleural effusion. IMPRESSION: Patchy consolidation  within the right mid lower lung concerning for pneumonia in the appropriate clinical setting. Additional consideration would be atelectasis in the setting of pleural effusion. Moderate layering right pleural effusion. Probable small left pleural effusion. Electronically Signed   By: Lovey Newcomer M.D.   On: 02/26/2016 16:44   Ct Angio Chest Pe W And/or Wo Contrast  Result Date: 02/26/2016 CLINICAL DATA:  Patient with shortness of breath. Irregular heart rate. EXAM: CT ANGIOGRAPHY CHEST WITH CONTRAST TECHNIQUE: Multidetector CT imaging of the chest was performed using the standard protocol during bolus administration of intravenous contrast. Multiplanar CT image reconstructions and MIPs were obtained to evaluate the vascular anatomy. CONTRAST:  75 cc Isovue 370 COMPARISON:  Chest radiograph 02/26/2016 FINDINGS: Cardiovascular: Heart is enlarged. Small pericardial effusion. Aorta and main pulmonary artery are normal in caliber. Filling defects are identified within the right lower lobe pulmonary arteries as well as left upper and left lower lobe pulmonary arteries. No CT evidence to suggest acute right heart strain. Coronary arterial vascular calcifications. Mediastinum/Nodes: No enlarged axillary, mediastinal or hilar lymphadenopathy. There is circumferential wall thickening of the mid and distal esophagus. Small hiatal hernia. Lungs/Pleura: Central airways are patent. Atelectasis of the right middle lobe. Consolidation within the right and left lower lobes. Moderate right and small left layering pleural effusions. No pneumothorax. Upper Abdomen: Liver is diffusely low in attenuation compatible with steatosis. Adrenal glands are normal. Musculoskeletal: Mid thoracic spine degenerative changes. No aggressive or acute appearing osseous lesions. Review of the MIP images confirms the above findings. IMPRESSION: Findings compatible with acute pulmonary embolus within the right lower lobe, left upper lobe and left lower  lobe pulmonary arteries. No evidence for right heart strain. There is circumferential wall thickening of the mid and distal esophagus. Acute esophagitis is not excluded. Small hiatal hernia. Consolidation of the right middle lobe with subpleural consolidation in the right lower and left lower lobes which may represent atelectasis. Infection not excluded. Moderate right and small left layering pleural effusions. Hepatic steatosis. Aortic atherosclerosis. Critical Value/emergent results were called by telephone at the time of interpretation on 02/26/2016 at 7:22 pm to Dr. Dorie Rank , who verbally acknowledged these results. Electronically Signed   By: Lovey Newcomer M.D.   On: 02/26/2016 19:23    EKG: Independently reviewed.  NSR with rate 96; LBBB with no evidence of acute ischemia  Assessment/Plan  Principal Problem:   PE (pulmonary thromboembolism) (HCC) Active Problems:   Diabetes mellitus, type 2 (HCC)   Anemia   Chronic kidney disease (CKD), stage III (moderate)   Essential hypertension   PE -This is a tricky situation -The patient had a prior prolonged hospitalization followed by a nursing home stay -She presented today with hypoxia to 87 and 91% with c/o SOB and CP -D-dimer positive -Chest CT showed multiple acute pulmonary emboli -Will start Heparin gtt, but with the awareness that she may also have a GI bleed (see below) -Will carefully monitor in SDU -Supplemental O2 as needed -Will not continue antibiotics for HCAP at this time - she did not present with acute infectious symptoms, her WBC count is improving, and we have a very reasonable alternate explanation for her SOB and hypoxia -She also has pleural effusions which may require drainage if she is not improving with anticoagulation  Anemia -This is the other very difficult part of her presentation -The patient was quite pale on admission and fatigued/SOB -Hemoglobin today is 8, was previously 11 on 1/28 -Her stool was soft and  brown but was trace heme positive -Additionally, the CT appears to indicate an esophagitis - which could be the source of a small bleed  -For now, will anticoagulate as above with Heparin (so that we can quickly turn it off in the event of more serious bleeding); follow q6h CBC; and give Protonix 40 mg IV BID -She may also benefit from Carafate once a diet is started, but for now she will be NPO other than sips with meds -If she develops frank bleeding, she will need transfer urgently to [facility] -Will request GI consult in AM for possible EGD - although this will likely need to happen once she is more stable from a respiratory standpoint  DM -Glucose 106, 49, 111 -Hypoglycemia was treated -Will discontinue her Levemir at this time - she is unlikely to suffer the consequences of long-term hyperglycemia, and hypoglycemia poses a much greater risk to her at this time -Will cover with SSI for now, but this may also be appropriate to discontinue  CKD -Creatinine 1.33; 2.61 on 1/28 -Marked improvement, likely back to baseline - although it is not entirely clear what her baseline is  *This patient was noted to have a poor prognosis during her last hospitalization.  She appears to have left the SNF on her own volition rather than when they thought she should be discharged.  With her acute illness, her prognosis remains very poor.  I discussed this with the patient but was unable to reach her family to discuss further.  She reports that her son does not have a telephone.  I tried to call her daughter on her listed cell phone number twice (the patient does not know it so hopefully the number listed in the chart is accurate), but was unable to reach the daughter and left 2 messages.  Total critical care time: 35 minutes Critical care time was exclusive of separately billable procedures and treating other patients. Critical care was necessary to treat or prevent imminent or life-threatening  deterioration. Critical care was time spent personally by me on the following activities: development of treatment plan with patient and/or surrogate as well as nursing, discussions with consultants, evaluation of patient's response to treatment, examination of patient, obtaining history from patient or surrogate, ordering and performing treatments and interventions, ordering and review of laboratory studies, ordering and review of radiographic studies, pulse oximetry and re-evaluation of  patient's condition.   DVT prophylaxis: Heparin drip Code Status:  DNR - confirmed with patient Family Communication: Unable to reach them on multiple attempts Disposition Plan: To be determined Consults called: GI Admission status: Admit - It is my clinical opinion that admission to INPATIENT is reasonable and necessary because this patient will require at least 2 midnights in the hospital to treat this condition based on the medical complexity of the problems presented.  Given the aforementioned information, the predictability of an adverse outcome is felt to be significant.     Karmen Bongo MD [facility]  If 7PM-7AM, please contact night-coverage [email or web address] Password [facility]  02/26/2016, 11:54 PM

## 2016-02-27 ENCOUNTER — Inpatient Hospital Stay (HOSPITAL_COMMUNITY): Payer: Medicare Other

## 2016-02-27 DIAGNOSIS — I2699 Other pulmonary embolism without acute cor pulmonale: Secondary | ICD-10-CM

## 2016-02-27 DIAGNOSIS — E1122 Type 2 diabetes mellitus with diabetic chronic kidney disease: Secondary | ICD-10-CM

## 2016-02-27 DIAGNOSIS — D649 Anemia, unspecified: Secondary | ICD-10-CM

## 2016-02-27 DIAGNOSIS — Z794 Long term (current) use of insulin: Secondary | ICD-10-CM

## 2016-02-27 DIAGNOSIS — I10 Essential (primary) hypertension: Secondary | ICD-10-CM

## 2016-02-27 LAB — CBC
HCT: 31.1 % — ABNORMAL LOW (ref 36.0–46.0)
HCT: 29.8 % — ABNORMAL LOW (ref 36.0–46.0)
HEMATOCRIT: 19.8 % — AB (ref 36.0–46.0)
HEMATOCRIT: 33 % — AB (ref 36.0–46.0)
Hemoglobin: 6.2 g/dL — CL (ref 12.0–15.0)
Hemoglobin: 11 g/dL — ABNORMAL LOW (ref 12.0–15.0)
Hemoglobin: 10.4 g/dL — ABNORMAL LOW (ref 12.0–15.0)
Hemoglobin: 10 g/dL — ABNORMAL LOW (ref 12.0–15.0)
MCH: 27.4 pg (ref 26.0–34.0)
MCH: 28.9 pg (ref 26.0–34.0)
MCH: 29.1 pg (ref 26.0–34.0)
MCH: 29.3 pg (ref 26.0–34.0)
MCHC: 31.3 g/dL (ref 30.0–36.0)
MCHC: 33.3 g/dL (ref 30.0–36.0)
MCHC: 33.4 g/dL (ref 30.0–36.0)
MCHC: 33.6 g/dL (ref 30.0–36.0)
MCV: 87.6 fL (ref 78.0–100.0)
MCV: 86.6 fL (ref 78.0–100.0)
MCV: 86.9 fL (ref 78.0–100.0)
MCV: 87.4 fL (ref 78.0–100.0)
PLATELETS: 236 10*3/uL (ref 150–400)
PLATELETS: 262 10*3/uL (ref 150–400)
PLATELETS: 234 10*3/uL (ref 150–400)
Platelets: 190 10*3/uL (ref 150–400)
RBC: 2.26 MIL/uL — ABNORMAL LOW (ref 3.87–5.11)
RBC: 3.81 MIL/uL — ABNORMAL LOW (ref 3.87–5.11)
RBC: 3.58 MIL/uL — ABNORMAL LOW (ref 3.87–5.11)
RBC: 3.41 MIL/uL — AB (ref 3.87–5.11)
RDW: 14 % (ref 11.5–15.5)
RDW: 14.1 % (ref 11.5–15.5)
RDW: 13.8 % (ref 11.5–15.5)
RDW: 14.2 % (ref 11.5–15.5)
WBC: 8.9 10*3/uL (ref 4.0–10.5)
WBC: 9.2 10*3/uL (ref 4.0–10.5)
WBC: 8.8 10*3/uL (ref 4.0–10.5)
WBC: 9 10*3/uL (ref 4.0–10.5)

## 2016-02-27 LAB — BASIC METABOLIC PANEL
Anion gap: 7 (ref 5–15)
BUN: 17 mg/dL (ref 6–20)
CO2: 31 mmol/L (ref 22–32)
CREATININE: 1.14 mg/dL — AB (ref 0.44–1.00)
Calcium: 7.5 mg/dL — ABNORMAL LOW (ref 8.9–10.3)
Chloride: 97 mmol/L — ABNORMAL LOW (ref 101–111)
GFR calc Af Amer: 49 mL/min — ABNORMAL LOW (ref >60)
GFR, EST NON AFRICAN AMERICAN: 42 mL/min — AB (ref >60)
Glucose, Bld: 99 mg/dL (ref 65–99)
Potassium: 3.6 mmol/L (ref 3.5–5.1)
SODIUM: 135 mmol/L (ref 135–145)

## 2016-02-27 LAB — GLUCOSE, CAPILLARY
GLUCOSE-CAPILLARY: 69 mg/dL (ref 65–99)
GLUCOSE-CAPILLARY: 98 mg/dL (ref 65–99)
Glucose-Capillary: 40 mg/dL — CL (ref 65–99)
Glucose-Capillary: 103 mg/dL — ABNORMAL HIGH (ref 65–99)
Glucose-Capillary: 70 mg/dL (ref 65–99)

## 2016-02-27 LAB — TROPONIN I
TROPONIN I: 0.03 ng/mL — AB (ref <0.03)
Troponin I: <0.03 ng/mL (ref <0.03)

## 2016-02-27 LAB — HEPARIN LEVEL (UNFRACTIONATED)
HEPARIN UNFRACTIONATED: 0.55 [IU]/mL (ref 0.30–0.70)
HEPARIN UNFRACTIONATED: 0.14 [IU]/mL — AB (ref 0.30–0.70)

## 2016-02-27 LAB — PREPARE RBC (CROSSMATCH)

## 2016-02-27 LAB — PROTIME-INR
INR: 1.1
PROTHROMBIN TIME: 14.3 s (ref 11.4–15.2)

## 2016-02-27 MED ORDER — DEXTROSE-NACL 5-0.9 % IV SOLN
INTRAVENOUS | Status: AC
  Administered 2016-02-27: via INTRAVENOUS

## 2016-02-27 MED ORDER — SODIUM CHLORIDE 0.9 % IV SOLN
Freq: Once | INTRAVENOUS | Status: AC
  Administered 2016-02-27: 03:00:00 via INTRAVENOUS

## 2016-02-27 MED ORDER — FUROSEMIDE 10 MG/ML IJ SOLN
20.0000 mg | Freq: Two times a day (BID) | INTRAMUSCULAR | Status: DC
  Administered 2016-02-27 – 2016-02-29 (×5): 20 mg via INTRAVENOUS
  Filled 2016-02-27 (×5): qty 2

## 2016-02-27 MED ORDER — POTASSIUM CHLORIDE 10 MEQ/100ML IV SOLN
10.0000 meq | INTRAVENOUS | Status: AC
Start: 2016-02-27 — End: 2016-02-27
  Administered 2016-02-27 (×3): 10 meq via INTRAVENOUS
  Filled 2016-02-27 (×2): qty 100

## 2016-02-27 MED ORDER — KCL IN DEXTROSE-NACL 20-5-0.45 MEQ/L-%-% IV SOLN
INTRAVENOUS | Status: DC
  Administered 2016-02-27: 18:00:00 via INTRAVENOUS

## 2016-02-27 MED ORDER — DEXTROSE 50 % IV SOLN
25.0000 mL | Freq: Once | INTRAVENOUS | Status: AC
  Administered 2016-02-27: 25 mL via INTRAVENOUS

## 2016-02-27 MED ORDER — ORAL CARE MOUTH RINSE
15.0000 mL | Freq: Two times a day (BID) | OROMUCOSAL | Status: DC
  Administered 2016-02-27 – 2016-02-29 (×5): 15 mL via OROMUCOSAL

## 2016-02-27 MED ORDER — DEXTROSE 50 % IV SOLN
INTRAVENOUS | Status: AC
  Filled 2016-02-27: qty 50

## 2016-02-27 MED ORDER — SODIUM CHLORIDE 0.9 % IV SOLN: 30.0000 meq | Freq: Once | INTRAVENOUS | Status: DC

## 2016-02-27 NOTE — Progress Notes (Signed)
Repeat Hgb has dropped from 8 to 6.2.  Patient consented and will transfuse 2 units PRBC at this time.  If she continues to trend down, she may need cessation of Heparin, transfer to [facility] for more urgent GI evaluation.  Patient discussed with [facility] physician, Dr. Corrie Dandy, who recommends transfusion.  He suggests trying to get in touch with the family.  She will need an IVC filter - not available to have it placed it at this time.   If Hgb drops again, will hold off on Heparin and call IR for stat IVC filter in the AM.    I once again tried calling the daughter on her mobile without success.  I also attempted to call her listed home telephone number and was not successful but she called back and I was able to speak with her and explain the situation.  She will plan to see the patient tomorrow and I will call her overnight if there are further clinical changes.  Carlyon Shadow, M.D.

## 2016-02-27 NOTE — Progress Notes (Signed)
PROGRESS NOTE    Sherry [name]  [id number] DOB: [birth year] DOA: 02/26/2016 PCP: [place]    Brief Narrative:  83 y/o female with multiple medical problems was recently in the hospital for sepsis and was discharged to SNF. On 2/17 she had requested discharge home from SNF. After arriving home, she became short of breath and was unable to ambulate. She was brought to [facility] and was found to have bilateral pulmonary emboli and significant anemia. She was started on heparin infusion, transfused 2 unit prbc and admitted for further management.   Assessment & Plan:   Principal Problem:   PE (pulmonary thromboembolism) ([place]) Active Problems:   Diabetes mellitus, type 2 (HCC)   Anemia   Chronic kidney disease (CKD), stage III (moderate)   Essential hypertension   1. Bilateral pulmonary emboli. Patient is currently on IV heparin. Would continue current treatments for now. Transition to oral agent once anemia issues have stabilized. Check venous dopplers. PE likely related to immobilization during recent hospitalization when she was critically ill.  2. Anemia. Etiology is unclear. Her stool did test heme positive, but she did not have any evidence of gross bleeding. Hemoglobin on admission was 8, but this may have been hemoconcentrated. It trended down to 6.2 with hydration. She received 2 unit prbc with improvement of hemoglobin to 11. GI has been consulted. Will continue to monitor closely  3. CKD stage 3. Creatinine appears to be near baseline. Continue to monitor.  4. Diabetes, she is on levemir and novolog. levemir currently on hold since she is npo for possible GI procedure. She is having episodes of hypoglycemia. Continue to hold levemir for now.  5. Anasarca. Suspect this is related to hypoalbuminemia. Will start on low dos lasix.    DVT prophylaxis: heparin infusion Code Status: DNR Family Communication: no family present Disposition Plan: will likely need  SNF   Consultants:   Gastroenterology  Procedures:     Antimicrobials:       Subjective: No gross bleeding. Feels breathing is better this morning  Objective: Vitals:   02/27/16 0552 02/27/16 0600 02/27/16 0721 02/27/16 0800  BP: (!) 127/46 (!) 128/57 (!) 106/48 (!) 179/67  Pulse: 76 76 74 76  Resp: 18 16 17 15   Temp: 97.6 F (36.4 C)  97.5 F (36.4 C) 97.9 F (36.6 C)  TempSrc: Oral  Axillary Oral  SpO2: 95% 95% 96% 97%  Weight:      Height:        Intake/Output Summary (Last 24 hours) at 02/27/16 0925 Last data filed at 02/27/16 [id number]  Gross per 24 hour  Intake           1771.5 ml  Output             2125 ml  Net           -353.5 ml   Filed Weights   02/26/16 1538 02/26/16 2147 02/27/16 0517  Weight: 75.3 kg (166 lb) 80.3 kg (177 lb 0.5 oz) 80.9 kg (178 lb 5.6 oz)    Examination:  General exam: Appears calm and comfortable  Respiratory system: Clear to auscultation. Respiratory effort normal. Cardiovascular system: S1 & S2 heard, RRR. No murmurs, rubs, gallops or clicks. 2+ pedal edema. Gastrointestinal system: Abdomen is nondistended, soft and nontender. No organomegaly or masses felt. Normal bowel sounds heard. Central nervous system: Alert and oriented. No focal neurological deficits. Extremities: Symmetric 5 x 5 power. Skin: No rashes, lesions or ulcers Psychiatry: Judgement  and insight appear normal. Mood & affect appropriate.     Data Reviewed: I have personally reviewed following labs and imaging studies  CBC:  Recent Labs Lab 02/26/16 1634 02/27/16 0136  WBC 12.7* 8.9  HGB 8.0* 6.2*  HCT 25.4* 19.8*  MCV 87.9 87.6  PLT 343 [id number]   Basic Metabolic Panel:  Recent Labs Lab 02/26/16 1634 02/27/16 0809  NA 138 135  K 3.2* 3.6  CL 91* 97*  CO2 36* 31  GLUCOSE 106* 99  BUN 22* 17  CREATININE 1.33* 1.14*  CALCIUM 8.4* 7.5*   GFR: Estimated Creatinine Clearance: 36.5 mL/min (by C-G formula based on SCr of 1.14 mg/dL (H)). Liver  Function Tests: No results for input(s): AST, ALT, ALKPHOS, BILITOT, PROT, ALBUMIN in the last 168 hours. No results for input(s): LIPASE, AMYLASE in the last 168 hours. No results for input(s): AMMONIA in the last 168 hours. Coagulation Profile:  Recent Labs Lab 02/26/16 1634 02/27/16 0809  INR 0.95 1.10   Cardiac Enzymes:  Recent Labs Lab 02/26/16 1634 02/27/16 0136 02/27/16 0810  TROPONINI <0.03 <0.03 <0.03   BNP (last 3 results) No results for input(s): PROBNP in the last 8760 hours. HbA1C: No results for input(s): HGBA1C in the last 72 hours. CBG:  Recent Labs Lab 02/26/16 2246 02/26/16 2302 02/26/16 2350 02/27/16 0719 02/27/16 0757  GLUCAP 49* 111* 72 40* 103*   Lipid Profile: No results for input(s): CHOL, HDL, LDLCALC, TRIG, CHOLHDL, LDLDIRECT in the last 72 hours. Thyroid Function Tests: No results for input(s): TSH, T4TOTAL, FREET4, T3FREE, THYROIDAB in the last 72 hours. Anemia Panel: No results for input(s): VITAMINB12, FOLATE, FERRITIN, TIBC, IRON, RETICCTPCT in the last 72 hours. Sepsis Labs: No results for input(s): PROCALCITON, LATICACIDVEN in the last 168 hours.  No results found for this or any previous visit (from the past 240 hour(s)).       Radiology Studies: Dg Chest 2 View  Result Date: 02/26/2016 CLINICAL DATA:  Patient with weakness.  Shortness of breath. EXAM: CHEST  2 VIEW COMPARISON:  Chest radiograph 01/30/2016. FINDINGS: Anterior cervical spinal fusion hardware. Cardiomegaly. Monitoring leads overlie the patient. Patchy consolidation within the right mid and lower lung. Moderate right pleural effusion. Probable small left pleural effusion. IMPRESSION: Patchy consolidation within the right mid lower lung concerning for pneumonia in the appropriate clinical setting. Additional consideration would be atelectasis in the setting of pleural effusion. Moderate layering right pleural effusion. Probable small left pleural effusion.  Electronically Signed   By: Lovey Newcomer M.D.   On: 02/26/2016 16:44   Ct Angio Chest Pe W And/or Wo Contrast  Result Date: 02/26/2016 CLINICAL DATA:  Patient with shortness of breath. Irregular heart rate. EXAM: CT ANGIOGRAPHY CHEST WITH CONTRAST TECHNIQUE: Multidetector CT imaging of the chest was performed using the standard protocol during bolus administration of intravenous contrast. Multiplanar CT image reconstructions and MIPs were obtained to evaluate the vascular anatomy. CONTRAST:  75 cc Isovue 370 COMPARISON:  Chest radiograph 02/26/2016 FINDINGS: Cardiovascular: Heart is enlarged. Small pericardial effusion. Aorta and main pulmonary artery are normal in caliber. Filling defects are identified within the right lower lobe pulmonary arteries as well as left upper and left lower lobe pulmonary arteries. No CT evidence to suggest acute right heart strain. Coronary arterial vascular calcifications. Mediastinum/Nodes: No enlarged axillary, mediastinal or hilar lymphadenopathy. There is circumferential wall thickening of the mid and distal esophagus. Small hiatal hernia. Lungs/Pleura: Central airways are patent. Atelectasis of the right middle lobe. Consolidation within the  right and left lower lobes. Moderate right and small left layering pleural effusions. No pneumothorax. Upper Abdomen: Liver is diffusely low in attenuation compatible with steatosis. Adrenal glands are normal. Musculoskeletal: Mid thoracic spine degenerative changes. No aggressive or acute appearing osseous lesions. Review of the MIP images confirms the above findings. IMPRESSION: Findings compatible with acute pulmonary embolus within the right lower lobe, left upper lobe and left lower lobe pulmonary arteries. No evidence for right heart strain. There is circumferential wall thickening of the mid and distal esophagus. Acute esophagitis is not excluded. Small hiatal hernia. Consolidation of the right middle lobe with subpleural  consolidation in the right lower and left lower lobes which may represent atelectasis. Infection not excluded. Moderate right and small left layering pleural effusions. Hepatic steatosis. Aortic atherosclerosis. Critical Value/emergent results were called by telephone at the time of interpretation on 02/26/2016 at 7:22 pm to Dr. Dorie Rank , who verbally acknowledged these results. Electronically Signed   By: Lovey Newcomer M.D.   On: 02/26/2016 19:23        Scheduled Meds: . atorvastatin  20 mg Oral QHS  . ferrous sulfate  325 mg Oral Q breakfast  . furosemide  20 mg Intravenous BID  . gabapentin  300 mg Oral QHS  . insulin aspart  0-9 Units Subcutaneous TID WC  . mouth rinse  15 mL Mouth Rinse BID  . pantoprazole (PROTONIX) IV  40 mg Intravenous Q12H  . sodium chloride flush  3 mL Intravenous Q12H   Continuous Infusions: . dextrose 5 % and 0.9% NaCl Stopped (02/27/16 0305)  . heparin 1,100 Units/hr (02/26/16 2100)     LOS: 1 day    Time spent: 26mins    MEMON,JEHANZEB, MD [facility] Pager [phone]  If 7PM-7AM, please contact night-coverage [email or web address] Password [facility] 02/27/2016, 9:25 AM

## 2016-02-27 NOTE — Progress Notes (Signed)
Referring Provider: Kathie Dike, MD Primary Care Physician:  [facility] Primary Gastroenterologist:  Dr. Laural Golden  Reason for Consultation:    Anemia and heme positive stool in a patient who requires anticoagulation for PE.  HPI:  History is obtained from the patient and chart.  Patient is 83 year old Caucasian female with history of diabetes mellitus, chronic kidney disease and hypertension who was admitted to [facility] on 01/30/2016 with 1 day history of nausea vomiting and diarrhea. He tested negative for influenza as well as C. difficile. Blood cultures are positive for Proteus. During hospitalization she required levo fed she gradually recovered. She was transferred to nursing home for rehabilitation and she was discharged yesterday. As she was being assisted from the car to the house by her family members she became very short of breath and weak. She was therefore brought to emergency room by EMS. Evaluation in emergency room revealed hemoglobin of 8 g & chest film was abnormal suggesting pneumonia and/or atelectasis. She had CT angios chest which revealed bilateral pulmonary emboli and bilateral pleural effusion right greater than left. She was also noted to have thickening to distal esophagus among other things. Patient was omitted to ICU. She was begun on cefepime and vancomycin as well as heparin. Hemoglobin early this morning was 6.2 g. She was therefore given 2 units of PRBCs and hemoglobin from this morning is 11 g. Her stool was noted to be heme positive. No melena or rectal bleeding reported. Patient denies abdominal pain nausea vomiting heartburn or dysphagia. She is nothing by mouth and she tells me that she is hungry. She states she has never been screened for colorectal carcinoma and there is no history of peptic ulcer disease. Ibuprofen is on list of her medications but it is unclear how often she's been taking this medication. Patient  states she son lives with her. He has another daughter. She lost one son because of cancer around 71 and daughter in the 76s but she does not remember what disease she had. 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 She has never smoked cigarettes or drank alcohol.  Past Medical History:  Diagnosis Date  . Diabetes mellitus without complication ([place])   . Hyperlipidemia   . Hypertension       Past Surgical History:  Procedure Laterality Date   Neck surgery    . SHOULDER SURGERY         Bilateral cataract extraction.  Prior to Admission medications   Medication Sig Start Date End Date Taking? Authorizing Provider  acetaminophen (TYLENOL) 325 MG tablet Take 650 mg by mouth every 4 (four) hours as needed for mild pain or moderate pain.   Yes Historical Provider, MD  atorvastatin (LIPITOR) 20 MG tablet Take 1 tablet by mouth at bedtime.   Yes Historical Provider, MD  ferrous sulfate 325 (65 FE) MG tablet Take 325 mg by mouth daily with breakfast.   Yes Historical Provider, MD  gabapentin (NEURONTIN) 300 MG capsule Take 1 capsule by mouth at bedtime.   Yes Historical Provider, MD  glipiZIDE (GLUCOTROL XL) 10 MG 24 hr tablet Take 1 tablet by mouth daily.   Yes Historical Provider, MD  insulin detemir (LEVEMIR) 100 UNIT/ML injection Inject 13 Units into the skin every morning.   Yes Historical Provider, MD  ondansetron (ZOFRAN) 4 MG tablet Take 4 mg by mouth 2 (two) times daily. *May take every 6 hours as needed for nausea and vomiting   Yes Historical Provider, MD  polyethylene glycol powder (GLYCOLAX/MIRALAX) powder Take 17 g by mouth every evening.   Yes Historical Provider, MD  Elastic Bandages & Supports (T.E.D. KNEE LENGTH/M-LONG) MISC 2 application by Does not apply route every morning. 11/05/15   Merlyn Lot, MD  feeding supplement, ENSURE ENLIVE, (ENSURE ENLIVE) LIQD Take 237 mLs by mouth 3 (three) times daily with meals. 02/07/16   Demetrios Loll, MD    Current Facility-Administered  Medications  Medication Dose Route Frequency Provider Last Rate Last Dose  . acetaminophen (TYLENOL) tablet 650 mg  650 mg Oral Q6H PRN Karmen Bongo, MD       Or  . acetaminophen (TYLENOL) suppository 650 mg  650 mg Rectal Q6H PRN Karmen Bongo, MD      . atorvastatin (LIPITOR) tablet 20 mg  20 mg Oral QHS Karmen Bongo, MD   20 mg at 02/26/16 2312  . dextrose 5 %-0.9 % sodium chloride infusion   Intravenous Continuous Jani Gravel, MD   Stopped at 02/27/16 0305  . furosemide (LASIX) injection 20 mg  20 mg Intravenous BID Kathie Dike, MD   20 mg at 02/27/16 1010  . gabapentin (NEURONTIN) capsule 300 mg  300 mg Oral QHS Karmen Bongo, MD   300 mg at 02/26/16 2312  . heparin ADULT infusion 100 units/mL (25000 units/276mL sodium chloride 0.45%)  1,000 Units/hr Intravenous Continuous Kathie Dike, MD 10 mL/hr at 02/27/16 0941 1,000 Units/hr at 02/27/16 0941  . insulin aspart (novoLOG) injection 0-9 Units  0-9 Units Subcutaneous TID WC Karmen Bongo, MD      . MEDLINE mouth rinse  15 mL Mouth Rinse BID Karmen Bongo, MD   15 mL at 02/27/16 1012  . ondansetron (ZOFRAN) tablet 4 mg  4 mg Oral Q6H PRN Karmen Bongo, MD       Or  . ondansetron [facility]) injection 4 mg  4 mg Intravenous Q6H PRN Karmen Bongo, MD      . pantoprazole (PROTONIX) injection 40 mg  40 mg Intravenous Q12H Dorie Rank, MD   40 mg at 02/27/16 1010  . sodium chloride flush (NS) 0.9 % injection 3 mL  3 mL Intravenous Q12H Karmen Bongo, MD   3 mL at 02/27/16 1013    Allergies as of 02/26/2016  . (No Known Allergies)    History reviewed. No pertinent family history.  Social History   Social History  . Marital status: Single    Spouse name: N/A  . Number of children: N/A  . Years of education: N/A   Occupational History  . Not on file.   Social History Main Topics  . Smoking status: Never Smoker  . Smokeless tobacco: Never Used  . Alcohol use No  . Drug use: No  . Sexual activity: Not on file   Other  Topics Concern  . Not on file   Social History Narrative  . No narrative on file    Review of Systems: See HPI, otherwise normal ROS  Physical Exam: Temp:  [97.5 F (36.4 C)-98.6 F (37 C)] 97.9 F (36.6 C) (02/18 0800) Pulse Rate:  [71-99] 76 (02/18 0800) Resp:  [14-23] 18 (02/18 1000) BP: (93-179)/(41-69) 141/64 (02/18 1000) SpO2:  [87 %-100 %] 95 % (02/18 1000) Weight:  [166 lb (75.3 kg)-178 lb 5.6 oz (80.9 kg)] 178 lb 5.6 oz (80.9 kg) (02/18 0517) Last BM Date: 02/26/16  Patient is alert and in no acute distress. She responds appropriately to questions. Conjunctiva is pink. Sclerae nonicteric. Oropharyngeal mucosa is dry. She has upper  and lower dentures in place. No neck masses or thyromegaly noted. Cardiac exam with regular rhythm normal S1 and S2. No murmur or gallop noted. Auscultation of lungs revealed diminished breath sounds at right base otherwise normal breath sounds. Abdomen is full. Bowel sounds are normal. On palpation abdomen is soft nontender without organomegaly or masses. No clubbing noted. She has 2-3+ pitting edema involving both legs to the level of knees.  Lab Results:  Recent Labs  02/26/16 1634 02/27/16 0136 02/27/16 0809  WBC 12.7* 8.9 9.2  HGB 8.0* 6.2* 11.0*  HCT 25.4* 19.8* 33.0*  PLT 343 236 190   BMET  Recent Labs  02/26/16 1634 02/27/16 0809  NA 138 135  K 3.2* 3.6  CL 91* 97*  CO2 36* 31  GLUCOSE 106* 99  BUN 22* 17  CREATININE 1.33* 1.14*  CALCIUM 8.4* 7.5*   PT/INR  Recent Labs  02/26/16 1634 02/27/16 0809  LABPROT 12.7 14.3  INR 0.95 1.10    Studies/Results: Dg Chest 2 View  Result Date: 02/26/2016 CLINICAL DATA:  Patient with weakness.  Shortness of breath. EXAM: CHEST  2 VIEW COMPARISON:  Chest radiograph 01/30/2016. FINDINGS: Anterior cervical spinal fusion hardware. Cardiomegaly. Monitoring leads overlie the patient. Patchy consolidation within the right mid and lower lung. Moderate right pleural  effusion. Probable small left pleural effusion. IMPRESSION: Patchy consolidation within the right mid lower lung concerning for pneumonia in the appropriate clinical setting. Additional consideration would be atelectasis in the setting of pleural effusion. Moderate layering right pleural effusion. Probable small left pleural effusion. Electronically Signed   By: Lovey Newcomer M.D.   On: 02/26/2016 16:44   Ct Angio Chest Pe W And/or Wo Contrast  Result Date: 02/26/2016 CLINICAL DATA:  Patient with shortness of breath. Irregular heart rate. EXAM: CT ANGIOGRAPHY CHEST WITH CONTRAST TECHNIQUE: Multidetector CT imaging of the chest was performed using the standard protocol during bolus administration of intravenous contrast. Multiplanar CT image reconstructions and MIPs were obtained to evaluate the vascular anatomy. CONTRAST:  75 cc Isovue 370 COMPARISON:  Chest radiograph 02/26/2016 FINDINGS: Cardiovascular: Heart is enlarged. Small pericardial effusion. Aorta and main pulmonary artery are normal in caliber. Filling defects are identified within the right lower lobe pulmonary arteries as well as left upper and left lower lobe pulmonary arteries. No CT evidence to suggest acute right heart strain. Coronary arterial vascular calcifications. Mediastinum/Nodes: No enlarged axillary, mediastinal or hilar lymphadenopathy. There is circumferential wall thickening of the mid and distal esophagus. Small hiatal hernia. Lungs/Pleura: Central airways are patent. Atelectasis of the right middle lobe. Consolidation within the right and left lower lobes. Moderate right and small left layering pleural effusions. No pneumothorax. Upper Abdomen: Liver is diffusely low in attenuation compatible with steatosis. Adrenal glands are normal. Musculoskeletal: Mid thoracic spine degenerative changes. No aggressive or acute appearing osseous lesions. Review of the MIP images confirms the above findings. IMPRESSION: Findings compatible with  acute pulmonary embolus within the right lower lobe, left upper lobe and left lower lobe pulmonary arteries. No evidence for right heart strain. There is circumferential wall thickening of the mid and distal esophagus. Acute esophagitis is not excluded. Small hiatal hernia. Consolidation of the right middle lobe with subpleural consolidation in the right lower and left lower lobes which may represent atelectasis. Infection not excluded. Moderate right and small left layering pleural effusions. Hepatic steatosis. Aortic atherosclerosis. Critical Value/emergent results were called by telephone at the time of interpretation on 02/26/2016 at 7:22 pm to Dr. Dorie Rank ,  who verbally acknowledged these results. Electronically Signed   By: Lovey Newcomer M.D.   On: 02/26/2016 19:23   I have reviewed CTA chest and chest film..  Assessment;  Patient is 83 year old Caucasian female with multiple medical problems and recent hospitalization for Proteus sepsis who now presents with shortness of breath and found to have bilateral pulmonary embolism and she was begun on heparin infusion. Patient's hemoglobin on admission was 8 g and dropped to 6.2 and now it is up to 11 g following transfusion with 2 units of PRBCs. Her stool is heme positive but no melena or rectal bleeding reported. Chest CT is significant for esophageal wall thickening most likely due to gastric esophageal reflux disease considering recent hospitalization for sepsis. This could be source of her heme positive stool. And that esophagitis but also been differential diagnosis she does not have dysphagia. Hemoglobin has jumped by almost 5 g with 2 units of PRBCs which was highly unlikely and therefore making me wonder if hemoglobin of 6.2 g was accurate reading. I do not see contraindication to use of heparin at this point. Patient is also at increased risk for peptic ulcer disease. Therefore PPI use would be appropriate. She could be switched to oral route  within the next 24 hours.   Recommendations;  Continue heparin infusion. If there is evidence of overt GI bleed will consider endoscopic evaluation. Discontinue ferrous sulfate for now. Continue pantoprazole IV for now. Daily Hemoccults 3 CBC in a.m.    LOS: 1 day   Nene Aranas  02/27/2016, 11:12 AM

## 2016-02-27 NOTE — Progress Notes (Signed)
[name] for Heparin Indication: pulmonary embolus  No Known Allergies  Patient Measurements: Height: 5\' 4"  (162.6 cm) Weight: 178 lb 5.6 oz (80.9 kg) IBW/kg (Calculated) : 54.7 Heparin Dosing Weight: 68.5 kg  Vital Signs: Temp: 99.1 F (37.3 C) (02/18 1800) Temp Source: Oral (02/18 1800) BP: 157/43 (02/18 1800)  Labs:  Recent Labs  02/26/16 1634 02/27/16 0136 02/27/16 0809 02/27/16 0810 02/27/16 1631 02/27/16 1925  HGB 8.0* 6.2* 11.0*  --  10.4*  --   HCT 25.4* 19.8* 33.0*  --  31.1*  --   PLT 343 236 190  --  262  --   LABPROT 12.7  --  14.3  --   --   --   INR 0.95  --  1.10  --   --   --   HEPARINUNFRC  --   --  0.55  --   --  0.14*  CREATININE 1.33*  --  1.14*  --   --   --   TROPONINI <0.03 <0.03  --  <0.03 0.03*  --     Estimated Creatinine Clearance: 36.5 mL/min (by C-G formula based on SCr of 1.14 mg/dL (H)).   Medical History: Past Medical History:  Diagnosis Date  . Diabetes mellitus without complication ([place])   . Hyperlipidemia   . Hypertension     Medications:  Prescriptions Prior to Admission  Medication Sig Dispense Refill Last Dose  . acetaminophen (TYLENOL) 325 MG tablet Take 650 mg by mouth every 4 (four) hours as needed for mild pain or moderate pain.   unknown  . atorvastatin (LIPITOR) 20 MG tablet Take 1 tablet by mouth at bedtime.   02/25/2016 at Unknown time  . ferrous sulfate 325 (65 FE) MG tablet Take 325 mg by mouth daily with breakfast.   02/26/2016 at Unknown time  . gabapentin (NEURONTIN) 300 MG capsule Take 1 capsule by mouth at bedtime.   02/25/2016 at Unknown time  . glipiZIDE (GLUCOTROL XL) 10 MG 24 hr tablet Take 1 tablet by mouth daily.   02/26/2016 at Unknown time  . insulin detemir (LEVEMIR) 100 UNIT/ML injection Inject 13 Units into the skin every morning.   02/26/2016 at Unknown time  . ondansetron (ZOFRAN) 4 MG tablet Take 4 mg by mouth 2 (two) times daily. *May take every 6 hours as  needed for nausea and vomiting   02/26/2016 at Unknown time  . polyethylene glycol powder (GLYCOLAX/MIRALAX) powder Take 17 g by mouth every evening.   02/25/2016 at Unknown time  . Elastic Bandages & Supports (T.E.D. KNEE LENGTH/M-LONG) MISC 2 application by Does not apply route every morning. 2 each 0   . feeding supplement, ENSURE ENLIVE, (ENSURE ENLIVE) LIQD Take 237 mLs by mouth 3 (three) times daily with meals. 237 mL 12     Assessment: 83 yo female ED patient. Findings compatible with acute pulmonary embolus within the right lower lobe, left upper lobe and left lower lobe pulmonary arteries. Heparin level at goal this AM. (0.55) Hgb has dropped from 8 to 6.2. 2 Units of PRBC ordered  Per MD note if Hgb drops again, will hold heparin and call IR for stat IVC filter in AM Heparin level below goal after rate decrease in attempt to obtain lower therapeutic heparin level     Goal of Therapy:  Heparin level 0.3-0.7 units/ml Monitor platelets by anticoagulation protocol: Yes   Plan:  Increase  heparin infusion to 1100 units/hour (same rate in mid  therapeutic range this AM) Heparin level in 8 hours Monitor CBC, platelets  Sherry [name] 02/27/2016,8:19 PM

## 2016-02-27 NOTE — Progress Notes (Signed)
ANTICOAGULATION CONSULT NOTE   Pharmacy Consult for Heparin Indication: pulmonary embolus  No Known Allergies  Patient Measurements: Height: 5\' 4"  (162.6 cm) Weight: 178 lb 5.6 oz (80.9 kg) IBW/kg (Calculated) : 54.7 Heparin Dosing Weight: 68.5 kg  Vital Signs: Temp: 97.9 F (36.6 C) (02/18 0800) Temp Source: Oral (02/18 0800) BP: 179/67 (02/18 0800) Pulse Rate: 76 (02/18 0800)  Labs:  Recent Labs  02/26/16 1634 02/27/16 0136 02/27/16 0809 02/27/16 0810  HGB 8.0* 6.2*  --   --   HCT 25.4* 19.8*  --   --   PLT 343 236  --   --   LABPROT 12.7  --  14.3  --   INR 0.95  --  1.10  --   HEPARINUNFRC  --   --  0.55  --   CREATININE 1.33*  --  1.14*  --   TROPONINI <0.03 <0.03  --  <0.03    Estimated Creatinine Clearance: 36.5 mL/min (by C-G formula based on SCr of 1.14 mg/dL (H)).   Medical History: Past Medical History:  Diagnosis Date  . Diabetes mellitus without complication ([place])   . Hyperlipidemia   . Hypertension     Medications:  Prescriptions Prior to Admission  Medication Sig Dispense Refill Last Dose  . acetaminophen (TYLENOL) 325 MG tablet Take 650 mg by mouth every 4 (four) hours as needed for mild pain or moderate pain.   unknown  . atorvastatin (LIPITOR) 20 MG tablet Take 1 tablet by mouth at bedtime.   02/25/2016 at Unknown time  . ferrous sulfate 325 (65 FE) MG tablet Take 325 mg by mouth daily with breakfast.   02/26/2016 at Unknown time  . gabapentin (NEURONTIN) 300 MG capsule Take 1 capsule by mouth at bedtime.   02/25/2016 at Unknown time  . glipiZIDE (GLUCOTROL XL) 10 MG 24 hr tablet Take 1 tablet by mouth daily.   02/26/2016 at Unknown time  . insulin detemir (LEVEMIR) 100 UNIT/ML injection Inject 13 Units into the skin every morning.   02/26/2016 at Unknown time  . ondansetron (ZOFRAN) 4 MG tablet Take 4 mg by mouth 2 (two) times daily. *May take every 6 hours as needed for nausea and vomiting   02/26/2016 at Unknown time  . polyethylene glycol  powder (GLYCOLAX/MIRALAX) powder Take 17 g by mouth every evening.   02/25/2016 at Unknown time  . Elastic Bandages & Supports (T.E.D. KNEE LENGTH/M-LONG) MISC 2 application by Does not apply route every morning. 2 each 0   . feeding supplement, ENSURE ENLIVE, (ENSURE ENLIVE) LIQD Take 237 mLs by mouth 3 (three) times daily with meals. 237 mL 12     Assessment: 83 yo female ED patient. Findings compatible with acute pulmonary embolus within the right lower lobe, left upper lobe and left lower lobe pulmonary arteries. Heparin level at goal this AM. (0.55) Hgb has dropped from 8 to 6.2. 2 Units of PRBC ordered  Per MD note if Hgb drops again, will hold heparin and call IR for stat IVC filter in AM    Goal of Therapy:  Heparin level 0.3-0.7 units/ml Monitor platelets by anticoagulation protocol: Yes   Plan:  Decrease heparin infusion to 1000 units/hour due to Hgb drop, hopefully heparin level will be at lower range of therapeutic range. Heparin level in 6-8 hours Monitor CBC, platelets  Abner Greenspan, Rayvn Rickerson Bennett 02/27/2016,9:24 AM

## 2016-02-28 LAB — CBC
HEMATOCRIT: 28.8 % — AB (ref 36.0–46.0)
HEMATOCRIT: 32.1 % — AB (ref 36.0–46.0)
Hemoglobin: 9.7 g/dL — ABNORMAL LOW (ref 12.0–15.0)
Hemoglobin: 10.6 g/dL — ABNORMAL LOW (ref 12.0–15.0)
MCH: 29.5 pg (ref 26.0–34.0)
MCH: 29.2 pg (ref 26.0–34.0)
MCHC: 33.7 g/dL (ref 30.0–36.0)
MCHC: 33 g/dL (ref 30.0–36.0)
MCV: 87.5 fL (ref 78.0–100.0)
MCV: 88.4 fL (ref 78.0–100.0)
Platelets: 234 10*3/uL (ref 150–400)
Platelets: 227 10*3/uL (ref 150–400)
RBC: 3.29 MIL/uL — ABNORMAL LOW (ref 3.87–5.11)
RBC: 3.63 MIL/uL — ABNORMAL LOW (ref 3.87–5.11)
RDW: 14.1 % (ref 11.5–15.5)
RDW: 14.2 % (ref 11.5–15.5)
WBC: 7.4 10*3/uL (ref 4.0–10.5)
WBC: 7.4 10*3/uL (ref 4.0–10.5)

## 2016-02-28 LAB — HEPARIN LEVEL (UNFRACTIONATED): Heparin Unfractionated: 0.35 IU/mL (ref 0.30–0.70)

## 2016-02-28 LAB — TROPONIN I: Troponin I: 0.03 ng/mL (ref <0.03)

## 2016-02-28 LAB — GLUCOSE, CAPILLARY
GLUCOSE-CAPILLARY: 124 mg/dL — AB (ref 65–99)
GLUCOSE-CAPILLARY: 86 mg/dL (ref 65–99)
GLUCOSE-CAPILLARY: 139 mg/dL — AB (ref 65–99)
Glucose-Capillary: 132 mg/dL — ABNORMAL HIGH (ref 65–99)
Glucose-Capillary: 119 mg/dL — ABNORMAL HIGH (ref 65–99)

## 2016-02-28 LAB — BRAIN NATRIURETIC PEPTIDE: B Natriuretic Peptide: 217 pg/mL — ABNORMAL HIGH (ref 0.0–100.0)

## 2016-02-28 MED ORDER — PANTOPRAZOLE SODIUM 40 MG PO TBEC
40.0000 mg | DELAYED_RELEASE_TABLET | Freq: Two times a day (BID) | ORAL | Status: DC
  Administered 2016-02-28 – 2016-03-01 (×5): 40 mg via ORAL
  Filled 2016-02-28 (×5): qty 1

## 2016-02-28 NOTE — Progress Notes (Signed)
PROGRESS NOTE    Sherry [name]  [id number] DOB: 10/22/1927 DOA: 02/26/2016 PCP: [place]   Brief Narrative:  Sherry [name] is a 83 y.o. female with medical history significant of DM, HLD, stage 3 CKD and HTN who was admitted to [facility] from 1/21-29/18 for septic shock resulting from Proteus bacteremia.  She required Levophed but was able to be weaned off this medication and improved. She was discharged to SNF at [facility]. Slight SOB.  Mild chest pain, substernal, with exertion.  Has not noticed blood in her stools.  No reported h/o anemia.  No h/o GI bleeding. Iniitially thought to have HCAP and so was given Cefepime and Vancomycin; her WBC count was improved.  When the blood work returned, she was found to be anemic and although she had soft brown stool, it was trace heme positive.  Meanwhile, with her SOB a D-dimer was obtained and was positive.  Subsequent chest CT showed multiple pulmonary emboli.  Assessment & Plan:   Principal Problem:   PE (pulmonary thromboembolism) ([place]) Active Problems:   Diabetes mellitus, type 2 (HCC)   Anemia   Chronic kidney disease (CKD), stage III (moderate)   Essential hypertension  PE- Unprovoked Vs unprovoked. Recent prolonged hospitalization, DVT ppx held last prolonged hospital admission for low platelets- dropped to 27. Then SNF stay, ?activity level. -Chest CT showed multiple acute pulmonary emboli -On heparin drip --Supplemental O2 as needed - Antibiotics held for now, temp 99.5, Consolidation on xray- ?Athelectasis or PNA. NO leukocytosis, no cough or SOB. Alternate explanation for SOB and hypoxia. Low threshold to start Abtics- Vanc and Zosyn for HCAP. -She also has pleural effusions- Mod Right, Small- left, which may require drainage if she is not improving with anticoagulation - D/c IVF - Keep in Step down for today, due to possible GI bleed - Elevated trop, most likely due to PE, will check  ECHO- Limited, and BNP, for prognostication and r/o RH strain. - DVTs in bilateral lower extremities. - will cont low dose lasix.  Anemia- Hgb- 9.7 today, S/p 2u, 6.2 on admission. likely eqilibrating. No Stools overnight, or this am. - trace heme positive -GI following, recs appreciated- Hemoccults with next bowel movement, Change pantoprazole to oral route.  DM -SSI - Hold levemir  CKD -Creatinine 1.33; 2.61 on 1/28 -Marked improvement, likely back to baseline - although it is not entirely clear what her baseline is - D/c IVF with significant pedal edema, may be DVTs.  Mild Elevated troponin- Down trended. Trend EKG Likely from PE  DVT prophylaxis: Heparin therapeutic  Code Status: DNR  Family Communication: Not at bedside.  Disposition Plan: Pending evaluation of Anemia, stable Hgb while on anticoag, then switch to PO NOAC.  Consultants:   GI   Procedures: None    Antimicrobials: None  Subjective: No complaints.   Objective: Vitals:   02/28/16 0400 02/28/16 0454 02/28/16 0500 02/28/16 0725  BP: (!) 129/49  (!) 134/44   Pulse: 85  83 84  Resp: 20  19 (!) 22  Temp:  99.5 F (37.5 C)  99.5 F (37.5 C)  TempSrc:  Oral  Axillary  SpO2: 92%  93% 97%  Weight:      Height:        Intake/Output Summary (Last 24 hours) at 02/28/16 0911 Last data filed at 02/28/16 0500  Gross per 24 hour  Intake          1069.01 ml  Output  2850 ml  Net         -1780.99 ml   Filed Weights   02/26/16 1538 02/26/16 2147 02/27/16 0517  Weight: 75.3 kg (166 lb) 80.3 kg (177 lb 0.5 oz) 80.9 kg (178 lb 5.6 oz)   Examination:  General exam: Appears calm and comfortable  Respiratory system: Clear to auscultation. Respiratory effort normal. Cardiovascular system: S1 & S2 heard, RRR. No JVD, murmurs, rubs, gallops or clicks. Gastrointestinal system: Abdomen is nondistended, soft and nontender. No organomegaly or masses felt. Normal bowel sounds heard. Central  nervous system: Alert and oriented. No focal neurological deficits. Extremities: Symmetric 5 x 5 power. Significant 2+ bilateral pitting pedal edema, to mid leg Skin: No rashes, lesions or ulcers Psychiatry: Judgement and insight appear normal. Mood & affect appropriate.   Data Reviewed: I have personally reviewed following labs and imaging studies  CBC:  Recent Labs Lab 02/27/16 0136 02/27/16 0809 02/27/16 1631 02/27/16 2312 02/28/16 0419  WBC 8.9 9.2 8.8 9.0 7.4  HGB 6.2* 11.0* 10.4* 10.0* 9.7*  HCT 19.8* 33.0* 31.1* 29.8* 28.8*  MCV 87.6 86.6 86.9 87.4 87.5  PLT 236 190 262 234 [id number]   Basic Metabolic Panel:  Recent Labs Lab 02/26/16 1634 02/27/16 0809  NA 138 135  K 3.2* 3.6  CL 91* 97*  CO2 36* 31  GLUCOSE 106* 99  BUN 22* 17  CREATININE 1.33* 1.14*  CALCIUM 8.4* 7.5*   Coagulation Profile:  Recent Labs Lab 02/26/16 1634 02/27/16 0809  INR 0.95 1.10   Cardiac Enzymes:  Recent Labs Lab 02/26/16 1634 02/27/16 0136 02/27/16 0810 02/27/16 1631  TROPONINI <0.03 <0.03 <0.03 0.03*   CBG:  Recent Labs Lab 02/27/16 1140 02/27/16 1639 02/27/16 2056 02/28/16 0202 02/28/16 0724  GLUCAP 70 69 98 124* 132*   Radiology Studies: Dg Chest 2 View  Result Date: 02/26/2016 CLINICAL DATA:  Patient with weakness.  Shortness of breath. EXAM: CHEST  2 VIEW COMPARISON:  Chest radiograph 01/30/2016. FINDINGS: Anterior cervical spinal fusion hardware. Cardiomegaly. Monitoring leads overlie the patient. Patchy consolidation within the right mid and lower lung. Moderate right pleural effusion. Probable small left pleural effusion. IMPRESSION: Patchy consolidation within the right mid lower lung concerning for pneumonia in the appropriate clinical setting. Additional consideration would be atelectasis in the setting of pleural effusion. Moderate layering right pleural effusion. Probable small left pleural effusion. Electronically Signed   By: Lovey Newcomer M.D.   On:  02/26/2016 16:44   Ct Angio Chest Pe W And/or Wo Contrast  Result Date: 02/26/2016 CLINICAL DATA:  Patient with shortness of breath. Irregular heart rate. EXAM: CT ANGIOGRAPHY CHEST WITH CONTRAST TECHNIQUE: Multidetector CT imaging of the chest was performed using the standard protocol during bolus administration of intravenous contrast. Multiplanar CT image reconstructions and MIPs were obtained to evaluate the vascular anatomy. CONTRAST:  75 cc Isovue 370 COMPARISON:  Chest radiograph 02/26/2016 FINDINGS: Cardiovascular: Heart is enlarged. Small pericardial effusion. Aorta and main pulmonary artery are normal in caliber. Filling defects are identified within the right lower lobe pulmonary arteries as well as left upper and left lower lobe pulmonary arteries. No CT evidence to suggest acute right heart strain. Coronary arterial vascular calcifications. Mediastinum/Nodes: No enlarged axillary, mediastinal or hilar lymphadenopathy. There is circumferential wall thickening of the mid and distal esophagus. Small hiatal hernia. Lungs/Pleura: Central airways are patent. Atelectasis of the right middle lobe. Consolidation within the right and left lower lobes. Moderate right and small left layering pleural effusions. No pneumothorax. Upper  Abdomen: Liver is diffusely low in attenuation compatible with steatosis. Adrenal glands are normal. Musculoskeletal: Mid thoracic spine degenerative changes. No aggressive or acute appearing osseous lesions. Review of the MIP images confirms the above findings. IMPRESSION: Findings compatible with acute pulmonary embolus within the right lower lobe, left upper lobe and left lower lobe pulmonary arteries. No evidence for right heart strain. There is circumferential wall thickening of the mid and distal esophagus. Acute esophagitis is not excluded. Small hiatal hernia. Consolidation of the right middle lobe with subpleural consolidation in the right lower and left lower lobes which  may represent atelectasis. Infection not excluded. Moderate right and small left layering pleural effusions. Hepatic steatosis. Aortic atherosclerosis. Critical Value/emergent results were called by telephone at the time of interpretation on 02/26/2016 at 7:22 pm to Dr. Dorie Rank , who verbally acknowledged these results. Electronically Signed   By: Lovey Newcomer M.D.   On: 02/26/2016 19:23   US Venous Img Lower Bilateral  Result Date: 02/27/2016 CLINICAL DATA:  Pulmonary thromboembolism yesterday EXAM: BILATERAL LOWER EXTREMITY VENOUS DUPLEX ULTRASOUND TECHNIQUE: Doppler venous assessment of the left lower extremity deep venous system was performed, including characterization of spectral flow, compressibility, and phasicity. COMPARISON:  None. FINDINGS: There is partially occlusive thrombus in the right common femoral vein. The femoral and popliteal veins are compressible and are without thrombus. Doppler analysis demonstrates respiratory phasicity in the deep venous system of the right lower extremity. In the left lower extremity, there is complete compressibility of the left common femoral, femoral, and popliteal veins. There is partially occlusive thrombus within the posterior tibial vein of the left calf. There is also superficial vein thrombus in the left lesser saphenous vein. IMPRESSION: The study is positive for DVT. There is partially occlusive thrombus in the right common femoral vein and left posterior tibial veins. The femoral and popliteal veins are patent bilaterally. Electronically Signed   By: Marybelle Killings M.D.   On: 02/27/2016 14:40   Scheduled Meds: . atorvastatin  20 mg Oral QHS  . furosemide  20 mg Intravenous BID  . gabapentin  300 mg Oral QHS  . insulin aspart  0-9 Units Subcutaneous TID WC  . mouth rinse  15 mL Mouth Rinse BID  . pantoprazole (PROTONIX) IV  40 mg Intravenous Q12H  . sodium chloride flush  3 mL Intravenous Q12H   Continuous Infusions: . heparin 1,100 Units/hr  (02/27/16 2200)    LOS: 2 days    Bethena Roys, MD [facility] Pager [phone]  If 7PM-7AM, please contact night-coverage [email or web address] Password [email or web address] 02/28/2016, 9:11 AM

## 2016-02-28 NOTE — Progress Notes (Signed)
ANTICOAGULATION CONSULT NOTE   Pharmacy Consult for Heparin Indication: pulmonary embolus  No Known Allergies  Patient Measurements: Height: 5\' 4"  (162.6 cm) Weight: 178 lb 5.6 oz (80.9 kg) IBW/kg (Calculated) : 54.7 Heparin Dosing Weight: 68.5 kg  Vital Signs: Temp: 99.5 F (37.5 C) (02/19 0725) Temp Source: Axillary (02/19 0725) BP: 141/57 (02/19 0900) Pulse Rate: 80 (02/19 0900)  Labs:  Recent Labs  02/26/16 1634 02/27/16 0136 02/27/16 0809 02/27/16 0810 02/27/16 1631 02/27/16 1925 02/27/16 2312 02/28/16 0419  HGB 8.0* 6.2* 11.0*  --  10.4*  --  10.0* 9.7*  HCT 25.4* 19.8* 33.0*  --  31.1*  --  29.8* 28.8*  PLT 343 236 190  --  262  --  234 234  LABPROT 12.7  --  14.3  --   --   --   --   --   INR 0.95  --  1.10  --   --   --   --   --   HEPARINUNFRC  --   --  0.55  --   --  0.14*  --  0.35  CREATININE 1.33*  --  1.14*  --   --   --   --   --   TROPONINI <0.03 <0.03  --  <0.03 0.03*  --   --   --     Estimated Creatinine Clearance: 36.5 mL/min (by C-G formula based on SCr of 1.14 mg/dL (H)).   Medical History: Past Medical History:  Diagnosis Date  . Diabetes mellitus without complication ([place])   . Hyperlipidemia   . Hypertension     Medications:  Prescriptions Prior to Admission  Medication Sig Dispense Refill Last Dose  . acetaminophen (TYLENOL) 325 MG tablet Take 650 mg by mouth every 4 (four) hours as needed for mild pain or moderate pain.   unknown  . atorvastatin (LIPITOR) 20 MG tablet Take 1 tablet by mouth at bedtime.   02/25/2016 at Unknown time  . ferrous sulfate 325 (65 FE) MG tablet Take 325 mg by mouth daily with breakfast.   02/26/2016 at Unknown time  . gabapentin (NEURONTIN) 300 MG capsule Take 1 capsule by mouth at bedtime.   02/25/2016 at Unknown time  . glipiZIDE (GLUCOTROL XL) 10 MG 24 hr tablet Take 1 tablet by mouth daily.   02/26/2016 at Unknown time  . insulin detemir (LEVEMIR) 100 UNIT/ML injection Inject 13 Units into the skin  every morning.   02/26/2016 at Unknown time  . ondansetron (ZOFRAN) 4 MG tablet Take 4 mg by mouth 2 (two) times daily. *May take every 6 hours as needed for nausea and vomiting   02/26/2016 at Unknown time  . polyethylene glycol powder (GLYCOLAX/MIRALAX) powder Take 17 g by mouth every evening.   02/25/2016 at Unknown time  . Elastic Bandages & Supports (T.E.D. KNEE LENGTH/M-LONG) MISC 2 application by Does not apply route every morning. 2 each 0   . feeding supplement, ENSURE ENLIVE, (ENSURE ENLIVE) LIQD Take 237 mLs by mouth 3 (three) times daily with meals. 237 mL 12    Assessment: 83 yo female, Findings compatible with acute pulmonary embolus within the right lower lobe, left upper lobe and left lower lobe pulmonary arteries. Heparin level at goal this AM  H/H better after transfusion.  Goal of Therapy:  Heparin level 0.3-0.7 units/ml Monitor platelets by anticoagulation protocol: Yes   Plan:  Continue heparin infusion at 1100 units/hour Heparin level daily Monitor CBC, platelets F/U plans for oral [facility]  [place] Crane, Nochum Fenter A 02/28/2016,10:26 AM

## 2016-02-28 NOTE — Care Management Note (Signed)
Case Management Note  Patient Details  Name: Sherry [name] MRN: [id number] Date of Birth: [birth year]  Subjective/Objective:    Patient adm with PE, recent DC from SNF on 02/26/2016 per notes. Per notes she will switch from IV heparin to oral agents once anemia issues have stabilized.              Action/Plan: CM following for needs.    Expected Discharge Date:                  Expected Discharge Plan:  [employer]  In-House Referral:     Discharge planning Services  CM Consult  Post Acute Care Choice:    Choice offered to:     DME Arranged:    DME Agency:     HH Arranged:    [place] Agency:     Status of Service:  In process, will continue to follow  If discussed at Long Length of Stay Meetings, dates discussed:    Additional Comments:  Ziona Wickens, Chauncey Reading, RN 02/28/2016, 11:15 AM

## 2016-02-28 NOTE — Progress Notes (Addendum)
  Subjective:  Patient has no complaints. She denies nausea vomiting heartburn dysphagia or abdominal pain. She also denies chest pain or shortness of breath. No melena or rectal bleeding reported by the nursing staff.  Objective: Blood pressure (!) 141/57, pulse 80, temperature 99.5 F (37.5 C), temperature source Axillary, resp. rate 19, height 5\' 4"  (1.626 m), weight 178 lb 5.6 oz (80.9 kg), SpO2 96 %. Patient is alert and in no acute distress. Abdomen remains soft and nontender without organomegaly or masses. She has 2+ pitting edema involving both legs. Edema is less than yesterday.  Labs/studies Results:   Recent Labs  02/27/16 1631 02/27/16 2312 02/28/16 0419  WBC 8.8 9.0 7.4  HGB 10.4* 10.0* 9.7*  HCT 31.1* 29.8* 28.8*  PLT 262 234 234    BMET   Recent Labs  02/26/16 1634 02/27/16 0809  NA 138 135  K 3.2* 3.6  CL 91* 97*  CO2 36* 31  GLUCOSE 106* 99  BUN 22* 17  CREATININE 1.33* 1.14*  CALCIUM 8.4* 7.5*      Recent Labs  02/26/16 1634 02/27/16 0809  LABPROT 12.7 14.3  INR 0.95 1.10    Doppler study of lower extremities reveals partially occlusive thrombus in right common femoral vein and left posterior tibial veins.  Assessment:  #1.Anemia. Hemoglobin on admission was 8 g and dropped to 6.2 g. He has received 2 units of PRBCs. Stool was quite positive but no evidence of overt GI bleed. Will monitor for evidence of overt GI bleed in which case endoscopic therapy would be indicated. She could have GI angiodysplasia which is very common in her age group or she could have reflux esophagitis or stress induced peptic ulcer. She does have thickening to distal esophageal wall on chest CT. disease. #2. Pulmonary embolism with bilateral DVT. Patient is on heparin infusion. #3. Right pulmonary infiltrate. Patient is also being treated for pneumonia. #4. Other problems include chronic kidney disease diabetes mellitus and mildly elevated troponin levels felt to be  due to PE.   Recommendations:  Hemoccults with next bowel movement. CBC in a.m. Change pantoprazole to oral route.

## 2016-02-28 NOTE — Plan of Care (Signed)
Problem: Safety: Goal: Ability to remain free from injury will improve Outcome: Progressing Patient understands the need to use the call bell if she needs anything.

## 2016-02-29 ENCOUNTER — Inpatient Hospital Stay (HOSPITAL_COMMUNITY): Payer: Medicare Other

## 2016-02-29 DIAGNOSIS — I370 Nonrheumatic pulmonary valve stenosis: Secondary | ICD-10-CM

## 2016-02-29 LAB — HEPARIN LEVEL (UNFRACTIONATED): Heparin Unfractionated: 0.39 IU/mL (ref 0.30–0.70)

## 2016-02-29 LAB — CBC
HEMATOCRIT: 30.3 % — AB (ref 36.0–46.0)
HEMOGLOBIN: 9.9 g/dL — AB (ref 12.0–15.0)
MCH: 28.7 pg (ref 26.0–34.0)
MCHC: 32.7 g/dL (ref 30.0–36.0)
MCV: 87.8 fL (ref 78.0–100.0)
Platelets: 199 10*3/uL (ref 150–400)
RBC: 3.45 MIL/uL — AB (ref 3.87–5.11)
RDW: 14.2 % (ref 11.5–15.5)
WBC: 5.9 10*3/uL (ref 4.0–10.5)

## 2016-02-29 LAB — GLUCOSE, CAPILLARY
GLUCOSE-CAPILLARY: 99 mg/dL (ref 65–99)
GLUCOSE-CAPILLARY: 163 mg/dL — AB (ref 65–99)
Glucose-Capillary: 204 mg/dL — ABNORMAL HIGH (ref 65–99)
Glucose-Capillary: 178 mg/dL — ABNORMAL HIGH (ref 65–99)

## 2016-02-29 LAB — ECHOCARDIOGRAM LIMITED
Height: 64 in
Weight: 2828.94 oz

## 2016-02-29 MED ORDER — FUROSEMIDE 10 MG/ML IJ SOLN
40.0000 mg | Freq: Two times a day (BID) | INTRAMUSCULAR | Status: AC
  Administered 2016-02-29 – 2016-03-01 (×2): 40 mg via INTRAVENOUS
  Filled 2016-02-29 (×2): qty 4

## 2016-02-29 MED ORDER — FUROSEMIDE 10 MG/ML IJ SOLN: 40.0000 mg | Freq: Two times a day (BID) | INTRAMUSCULAR | Status: DC

## 2016-02-29 NOTE — Progress Notes (Signed)
PROGRESS NOTE    LANE KNIGHTEN  [id number] DOB: 1927-03-11 DOA: 02/26/2016 PCP: [place]   Brief Narrative:  Sherry [name] is a 83 y.o. female with medical history significant of DM, HLD, stage 3 CKD and HTN who was admitted to [facility] from 1/21-29/18 for septic shock resulting from Proteus bacteremia.  She required Levophed but was able to be weaned off this medication and improved. She was discharged to SNF at [facility]. Slight SOB.  Mild chest pain, substernal, with exertion.  Has not noticed blood in her stools.  No reported h/o anemia.  No h/o GI bleeding. Iniitially thought to have HCAP and so was given Cefepime and Vancomycin; her WBC count was improved.  When the blood work returned, she was found to be anemic and although she had soft brown stool, it was trace heme positive.  Meanwhile, with her SOB a D-dimer was obtained and was positive.  Subsequent chest CT showed multiple pulmonary emboli.  Assessment & Plan:   Principal Problem:   PE (pulmonary thromboembolism) ([place]) Active Problems:   Diabetes mellitus, type 2 (HCC)   Anemia   Chronic kidney disease (CKD), stage III (moderate)   Essential hypertension  PE- Unprovoked Vs unprovoked. Recent prolonged hospitalization, DVT ppx held during last prolonged hospital admission for low platelets- dropped to 27. Then subsequent SNF stay, ?activity level. - Transfer to tele - Chest CT showed multiple acute pulmonary emboli -On heparin drip, continue for now, consider switch tomorrow if Hgb remains stable no no bloody bowel movements. --Supplemental O2 as needed - Antibiotics held for now, no fevers, Consolidation on xray- ?Athelectasis or PNA. NO leukocytosis, no cough or SOB. Alternate explanation for SOB and hypoxia. Low threshold to start Abtics- Vanc and Zosyn for HCAP. -She also has pleural effusions- Mod Right, Small- left, which may require drainage if she is not improving with  anticoagulation - Elevated trop, most likely due to PE,  - Limited ECHO- check for Right heart strain. - Lower extremity +2 edema, likely 2/2 DVTs, and IV fluids, low albumin-2.4, contributing.  - Has been on lasix 20mg  BID, will give IV lasix 40mg  till tomorrow am. - If persistent bleeding and need IVC filter  Anemia- Hgb- 9.9 today, stable, S/p 2u, 6.2 on admission. likely eqilibrating. No stools for the past 2 days, no documented hematochezia or melena.. - trace heme positive stools -GI following, recs appreciated- Hemoccults with next bowel movement, no on oral ppi.  DM -SSI - Hold levemir  CKD -Creatinine 1.33; 2.61 on 1/28 -Marked improvement, likely back to baseline - although it is not entirely clear what her baseline is - IV lasix 40mg  BID till am. - CMp am  Mild Elevated troponin- ,0.03. Down trended X2. Most likely from PE. with elevated bnp-217, portends worse prognosis. EKG- LBBB, unchanged from prior.  DVT prophylaxis: Heparin therapeutic  Code Status: DNR  Family Communication: Not at bedside.  Disposition Plan: Pending evaluation of Anemia, stable Hgb while on anticoag, then switch to PO NOAC.  Consultants:   GI   Procedures: None    Antimicrobials: None  Subjective: No complaints. Eating breakfast. Tells me she feels better today.  Objective: Vitals:   02/29/16 1100 02/29/16 1122 02/29/16 1200 02/29/16 1300  BP: (!) 142/52  (!) 177/57   Pulse: 90 92 98 81  Resp: (!) 23 (!) 28 16 (!) 21  Temp:  98.7 F (37.1 C)    TempSrc:  Oral  SpO2: 99% 99% 98% 100%  Weight:      Height:        Intake/Output Summary (Last 24 hours) at 02/29/16 1335 Last data filed at 02/29/16 0900  Gross per 24 hour  Intake              842 ml  Output             2600 ml  Net            -1758 ml   Filed Weights   02/26/16 2147 02/27/16 0517 02/29/16 0500  Weight: 80.3 kg (177 lb 0.5 oz) 80.9 kg (178 lb 5.6 oz) 80.2 kg (176 lb 12.9 oz)    Examination:  General exam: Appears calm and comfortable, Puffy hands. Respiratory system: Clear to auscultation. Respiratory effort normal. Cardiovascular system: S1 & S2 heard, RRR. No JVD, murmurs, rubs, gallops or clicks. Gastrointestinal system: Abdomen is nondistended, soft and nontender. No organomegaly or masses felt. Normal bowel sounds heard. Central nervous system: Alert and oriented. No focal neurological deficits. Extremities: Symmetric 5 x 5 power. Significant 2+ bilateral pitting pedal edema, to mid leg Skin: No rashes, lesions or ulcers Psychiatry: Judgement and insight appear normal. Mood & affect appropriate.   Data Reviewed: I have personally reviewed following labs and imaging studies  CBC:  Recent Labs Lab 02/27/16 1631 02/27/16 2312 02/28/16 0419 02/28/16 0953 02/29/16 0439  WBC 8.8 9.0 7.4 7.4 5.9  HGB 10.4* 10.0* 9.7* 10.6* 9.9*  HCT 31.1* 29.8* 28.8* 32.1* 30.3*  MCV 86.9 87.4 87.5 88.4 87.8  PLT 262 234 234 227 [id number]   Basic Metabolic Panel:  Recent Labs Lab 02/26/16 1634 02/27/16 0809  NA 138 135  K 3.2* 3.6  CL 91* 97*  CO2 36* 31  GLUCOSE 106* 99  BUN 22* 17  CREATININE 1.33* 1.14*  CALCIUM 8.4* 7.5*   Coagulation Profile:  Recent Labs Lab 02/26/16 1634 02/27/16 0809  INR 0.95 1.10   Cardiac Enzymes:  Recent Labs Lab 02/27/16 0810 02/27/16 1631 02/28/16 0953 02/28/16 1546 02/28/16 2105  TROPONINI <0.03 0.03* 0.03* <0.03 <0.03   CBG:  Recent Labs Lab 02/28/16 1128 02/28/16 1707 02/28/16 2201 02/29/16 0718 02/29/16 1121  GLUCAP 86 139* 119* 99 204*   Radiology Studies: US Venous Img Lower Bilateral  Result Date: 02/27/2016 CLINICAL DATA:  Pulmonary thromboembolism yesterday EXAM: BILATERAL LOWER EXTREMITY VENOUS DUPLEX ULTRASOUND TECHNIQUE: Doppler venous assessment of the left lower extremity deep venous system was performed, including characterization of spectral flow, compressibility, and phasicity.  COMPARISON:  None. FINDINGS: There is partially occlusive thrombus in the right common femoral vein. The femoral and popliteal veins are compressible and are without thrombus. Doppler analysis demonstrates respiratory phasicity in the deep venous system of the right lower extremity. In the left lower extremity, there is complete compressibility of the left common femoral, femoral, and popliteal veins. There is partially occlusive thrombus within the posterior tibial vein of the left calf. There is also superficial vein thrombus in the left lesser saphenous vein. IMPRESSION: The study is positive for DVT. There is partially occlusive thrombus in the right common femoral vein and left posterior tibial veins. The femoral and popliteal veins are patent bilaterally. Electronically Signed   By: Marybelle Killings M.D.   On: 02/27/2016 14:40   Scheduled Meds: . atorvastatin  20 mg Oral QHS  . furosemide  40 mg Intravenous BID  . gabapentin  300 mg Oral QHS  . insulin aspart  0-9 Units Subcutaneous  TID WC  . mouth rinse  15 mL Mouth Rinse BID  . pantoprazole  40 mg Oral BID AC  . sodium chloride flush  3 mL Intravenous Q12H   Continuous Infusions: . heparin 1,100 Units/hr (02/29/16 0600)    LOS: 3 days    Bethena Roys, MD [facility] Pager [phone]  If 7PM-7AM, please contact night-coverage [email or web address] Password [email or web address] 02/29/2016, 1:35 PM

## 2016-02-29 NOTE — Progress Notes (Signed)
ANTICOAGULATION CONSULT NOTE   Pharmacy Consult for Heparin Indication: pulmonary embolus  No Known Allergies  Patient Measurements: Height: 5\' 4"  (162.6 cm) Weight: 176 lb 12.9 oz (80.2 kg) IBW/kg (Calculated) : 54.7 Heparin Dosing Weight: 68.5 kg  Vital Signs: Temp: 98.3 F (36.8 C) (02/20 0719) Temp Source: Oral (02/20 0719) BP: 148/55 (02/20 0600) Pulse Rate: 83 (02/20 0719)  Labs:  Recent Labs  02/26/16 1634  02/27/16 0809  02/27/16 1925  02/28/16 0419 02/28/16 0953 02/28/16 1546 02/28/16 2105 02/29/16 0439  HGB 8.0*  < > 11.0*  < >  --   < > 9.7* 10.6*  --   --  9.9*  HCT 25.4*  < > 33.0*  < >  --   < > 28.8* 32.1*  --   --  30.3*  PLT 343  < > 190  < >  --   < > 234 227  --   --  199  LABPROT 12.7  --  14.3  --   --   --   --   --   --   --   --   INR 0.95  --  1.10  --   --   --   --   --   --   --   --   HEPARINUNFRC  --   < > 0.55  --  0.14*  --  0.35  --   --   --  0.39  CREATININE 1.33*  --  1.14*  --   --   --   --   --   --   --   --   TROPONINI <0.03  < >  --   < >  --   --   --  0.03* <0.03 <0.03  --   < > = values in this interval not displayed.  Estimated Creatinine Clearance: 36.3 mL/min (by C-G formula based on SCr of 1.14 mg/dL (H)).   Medical History: Past Medical History:  Diagnosis Date  . Diabetes mellitus without complication ([place])   . Hyperlipidemia   . Hypertension     Medications:  Prescriptions Prior to Admission  Medication Sig Dispense Refill Last Dose  . acetaminophen (TYLENOL) 325 MG tablet Take 650 mg by mouth every 4 (four) hours as needed for mild pain or moderate pain.   unknown  . atorvastatin (LIPITOR) 20 MG tablet Take 1 tablet by mouth at bedtime.   02/25/2016 at Unknown time  . ferrous sulfate 325 (65 FE) MG tablet Take 325 mg by mouth daily with breakfast.   02/26/2016 at Unknown time  . gabapentin (NEURONTIN) 300 MG capsule Take 1 capsule by mouth at bedtime.   02/25/2016 at Unknown time  . glipiZIDE (GLUCOTROL XL)  10 MG 24 hr tablet Take 1 tablet by mouth daily.   02/26/2016 at Unknown time  . insulin detemir (LEVEMIR) 100 UNIT/ML injection Inject 13 Units into the skin every morning.   02/26/2016 at Unknown time  . ondansetron (ZOFRAN) 4 MG tablet Take 4 mg by mouth 2 (two) times daily. *May take every 6 hours as needed for nausea and vomiting   02/26/2016 at Unknown time  . polyethylene glycol powder (GLYCOLAX/MIRALAX) powder Take 17 g by mouth every evening.   02/25/2016 at Unknown time  . Elastic Bandages & Supports (T.E.D. KNEE LENGTH/M-LONG) MISC 2 application by Does not apply route every morning. 2 each 0   . feeding supplement, ENSURE ENLIVE, (ENSURE ENLIVE) LIQD Take  237 mLs by mouth 3 (three) times daily with meals. 237 mL 12    Assessment: 83 yo female, Findings compatible with acute pulmonary embolus within the right lower lobe, left upper lobe and left lower lobe pulmonary arteries. Heparin level at goal this AM.   H/H has dropped some this AM.  Goal of Therapy:  Heparin level 0.3-0.7 units/ml Monitor platelets by anticoagulation protocol: Yes   Plan:  Continue heparin infusion at 1100 units/hour Heparin level daily Monitor CBC, platelets F/U plans for oral AC  Isac Sarna, BS [place]ena [place], BCPS Clinical Pharmacist Pager [phone] 02/29/2016,8:29 AM

## 2016-02-29 NOTE — Progress Notes (Signed)
*  PRELIMINARY RESULTS* Echocardiogram 2D Echocardiogram has been performed.  Sherry [name] 02/29/2016, 4:10 PM

## 2016-03-01 DIAGNOSIS — N183 Chronic kidney disease, stage 3 (moderate): Secondary | ICD-10-CM

## 2016-03-01 DIAGNOSIS — I2699 Other pulmonary embolism without acute cor pulmonale: Principal | ICD-10-CM

## 2016-03-01 LAB — TYPE AND SCREEN
ABO/RH(D): O POS
Antibody Screen: NEGATIVE
UNIT DIVISION: 0
UNIT DIVISION: 0
Unit division: 0

## 2016-03-01 LAB — GLUCOSE, CAPILLARY
Glucose-Capillary: 168 mg/dL — ABNORMAL HIGH (ref 65–99)
Glucose-Capillary: 184 mg/dL — ABNORMAL HIGH (ref 65–99)
Glucose-Capillary: 210 mg/dL — ABNORMAL HIGH (ref 65–99)

## 2016-03-01 LAB — BASIC METABOLIC PANEL
ANION GAP: 9 (ref 5–15)
BUN: 14 mg/dL (ref 6–20)
CALCIUM: 7 mg/dL — AB (ref 8.9–10.3)
CO2: 39 mmol/L — AB (ref 22–32)
Chloride: 86 mmol/L — ABNORMAL LOW (ref 101–111)
Creatinine, Ser: 1.02 mg/dL — ABNORMAL HIGH (ref 0.44–1.00)
GFR calc Af Amer: 56 mL/min — ABNORMAL LOW (ref >60)
GFR, EST NON AFRICAN AMERICAN: 48 mL/min — AB (ref >60)
GLUCOSE: 153 mg/dL — AB (ref 65–99)
POTASSIUM: 2.4 mmol/L — AB (ref 3.5–5.1)
Sodium: 134 mmol/L — ABNORMAL LOW (ref 135–145)

## 2016-03-01 LAB — CBC
HEMATOCRIT: 30.2 % — AB (ref 36.0–46.0)
Hemoglobin: 9.5 g/dL — ABNORMAL LOW (ref 12.0–15.0)
MCH: 28 pg (ref 26.0–34.0)
MCHC: 31.5 g/dL (ref 30.0–36.0)
MCV: 89.1 fL (ref 78.0–100.0)
PLATELETS: 181 10*3/uL (ref 150–400)
RBC: 3.39 MIL/uL — ABNORMAL LOW (ref 3.87–5.11)
RDW: 14 % (ref 11.5–15.5)
WBC: 5.1 10*3/uL (ref 4.0–10.5)

## 2016-03-01 LAB — HEPARIN LEVEL (UNFRACTIONATED): Heparin Unfractionated: 0.27 IU/mL — ABNORMAL LOW (ref 0.30–0.70)

## 2016-03-01 LAB — MAGNESIUM: Magnesium: 1 mg/dL — ABNORMAL LOW (ref 1.7–2.4)

## 2016-03-01 MED ORDER — APIXABAN 5 MG PO TABS: 10.0000 mg | ORAL_TABLET | Freq: Two times a day (BID) | ORAL | 0 refills | Status: DC

## 2016-03-01 MED ORDER — POTASSIUM CHLORIDE CRYS ER 20 MEQ PO TBCR: 20.0000 meq | EXTENDED_RELEASE_TABLET | Freq: Two times a day (BID) | ORAL | 0 refills | Status: DC

## 2016-03-01 MED ORDER — MAGNESIUM SULFATE 2 GM/50ML IV SOLN
2.0000 g | Freq: Once | INTRAVENOUS | Status: AC
  Administered 2016-03-01: 2 g via INTRAVENOUS
  Filled 2016-03-01: qty 50

## 2016-03-01 MED ORDER — POTASSIUM CHLORIDE 10 MEQ/100ML IV SOLN
10.0000 meq | INTRAVENOUS | Status: AC
  Administered 2016-03-01 (×3): 10 meq via INTRAVENOUS
  Filled 2016-03-01 (×2): qty 100

## 2016-03-01 MED ORDER — POTASSIUM CHLORIDE CRYS ER 20 MEQ PO TBCR
30.0000 meq | EXTENDED_RELEASE_TABLET | Freq: Once | ORAL | Status: AC
  Administered 2016-03-01: 09:00:00 30 meq via ORAL
  Filled 2016-03-01: qty 1

## 2016-03-01 MED ORDER — SODIUM CHLORIDE 0.9 % IV SOLN
30.0000 meq | Freq: Once | INTRAVENOUS | Status: DC
  Filled 2016-03-01: qty 15

## 2016-03-01 MED ORDER — HEPARIN BOLUS VIA INFUSION
1000.0000 [IU] | Freq: Once | INTRAVENOUS | Status: AC
  Administered 2016-03-01: 1000 [IU] via INTRAVENOUS
  Filled 2016-03-01: qty 1000

## 2016-03-01 MED ORDER — APIXABAN 5 MG PO TABS
10.0000 mg | ORAL_TABLET | Freq: Two times a day (BID) | ORAL | Status: DC
  Administered 2016-03-01: 10 mg via ORAL
  Filled 2016-03-01: qty 2

## 2016-03-01 MED ORDER — APIXABAN 5 MG PO TABS: 5.0000 mg | ORAL_TABLET | Freq: Two times a day (BID) | ORAL | Status: DC

## 2016-03-01 MED ORDER — POTASSIUM CHLORIDE CRYS ER 20 MEQ PO TBCR: 40.0000 meq | EXTENDED_RELEASE_TABLET | Freq: Once | ORAL | Status: DC

## 2016-03-01 MED ORDER — APIXABAN 5 MG PO TABS: 5.0000 mg | ORAL_TABLET | Freq: Two times a day (BID) | ORAL | 3 refills | Status: DC

## 2016-03-01 NOTE — Evaluation (Signed)
Physical Therapy Evaluation Patient Details Name: Sherry [name] MRN: [id number] DOB: 14-Aug-1927 Today's Date: 03/01/2016   History of Present Illness  83 y.o. female with medical history significant of DM, HLD, stage 3 CKD and HTN who was admitted to [facility] from 1/21-29/18 for septic shock resulting from Proteus bacteremia.  She required Levophed but was able to be weaned off this medication and improved. She was discharged to SNF at [facility].  No records from the SNF are available but the patient reports that she requested discharge to home today.  However, upon arriving at home she was unable to climb the steps when she got home, her legs wouldn't work.  Slight SOB.  Mild chest pain, substernal, with exertion.  Has not noticed blood in her stools.  No reported h/o anemia.  No h/o GI bleeding.  In the ER today, she was noted to be pale and was initially thought to have HCAP and so was given Cefepime and Vancomycin; her WBC count was improved.  When the blood work returned, she was found to be anemic and although she had soft brown stool, it was trace heme positive.  Meanwhile, qith her SOB a D-dimer was obtained and was positive.  Subsequent chest CT showed multiple pulmonary emboli, LE doppler showed DVT in R femoral vein, and L posteiror tibial veins.     Clinical Impression  Pt received in bed, and was agreeable to PT evaluation.  Pt states that while she was at [facility] facility, the most she was able to do was take 7-8 steps for gait.  She also required assistance for bathing while there.  During today's PT evaluation, she required Mod A for transfer supine<>sit, and Max A for SPT bed<>chair due to strong posterior lean.  Pt has expressed that she is not willing to return to SNF at this time and she is choosing to go home with 24/7 supervision/assistance from dtr and son in law.  She will also need HHPT, hospital bed, w/c and gait belt.  Discussed  with dtr and son in law how pt mobilized and recommendations.     Follow Up Recommendations Supervision/Assistance - 24 hour;Home health PT    Equipment Recommendations  Hospital bed;Wheelchair (measurements PT);Wheelchair cushion (measurements PT);Other (comment) (gait belt )    Recommendations for Other Services       Precautions / Restrictions Precautions Precautions: Fall Precaution Comments: due to immobility.  Pt denies any falls in the last 6 months.  Restrictions Weight Bearing Restrictions: No      Mobility  Bed Mobility Overal bed mobility: Needs Assistance Bed Mobility: Supine to Sit     Supine to sit: Mod assist;HOB elevated        Transfers Overall transfer level: Needs assistance Equipment used: Rolling walker (2 wheeled) Transfers: Sit to/from [facility] Sit to Stand: Max assist Stand pivot transfers: Max assist (Pt was able to take a few steps bed<>chair with SpO2 desaturate to 87% on 2L.  Poor eccentric control down into the chair )          Ambulation/Gait                Stairs            Wheelchair Mobility    Modified Rankin (Stroke Patients Only)       Balance Overall balance assessment: Needs assistance Sitting-balance support: Bilateral upper extremity supported;Feet supported Sitting balance-Leahy Scale: Poor Sitting balance - Comments: Pt demonstrates  slight posterior lean and needs multiple vc's to lean fwd.     Standing balance support: Bilateral upper extremity supported Standing balance-Leahy Scale: Poor Standing balance comment: Pt demonstrates strong posterior lean initially upon standing, leaning LE's back against the bed.                               Pertinent Vitals/Pain Pain Assessment: No/denies pain    Home Living   Living Arrangements: Children (Pt's dtr and son in law. ) Available Help at Discharge: Available 24 hours/day Type of Home: Apartment Home Access: Stairs  to enter   Entrance Stairs-Number of Steps: 4 steps with HR leading into dtr's house.   Home Layout: One level Home Equipment: Walker - 4 wheels      Prior Function     Gait / Transfers Assistance Needed: Pt states that when she was at the rehab facility she had only taken up to 7 to 8 steps with RW with +1 person assistance.  ADL's / Homemaking Assistance Needed: Pt was independent with dressing, but required assistance for bathing.          Hand Dominance   Dominant Hand: Right    Extremity/Trunk Assessment   Upper Extremity Assessment Upper Extremity Assessment: Generalized weakness    Lower Extremity Assessment Lower Extremity Assessment: Generalized weakness       Communication      Cognition Arousal/Alertness: Awake/alert Behavior During Therapy: WFL for tasks assessed/performed   Area of Impairment: Orientation Orientation Level: Disoriented to;Time             General Comments: States it's March 1926.      General Comments      Exercises     Assessment/Plan    PT Assessment Patient needs continued PT services  PT Problem List Decreased strength;Decreased activity tolerance;Decreased balance;Decreased cognition;Decreased knowledge of use of DME;Decreased safety awareness;Cardiopulmonary status limiting activity       PT Treatment Interventions DME instruction;Gait training;Functional mobility training;Therapeutic activities;Therapeutic exercise;Balance training;Patient/family education;Wheelchair mobility training    PT Goals (Current goals can be found in the Care Plan section)  Acute Rehab PT Goals Patient Stated Goal: To go home.  PT Goal Formulation: With patient/family Time For Goal Achievement: 03/08/16 Potential to Achieve Goals: Fair    Frequency Min 3X/week   Barriers to discharge        Co-evaluation               End of Session Equipment Utilized During Treatment: Gait belt;Oxygen Activity Tolerance: Patient limited  by fatigue Patient left: in chair Nurse Communication: Mobility status (Lauren, RN notified of pt's mobiltiy status. ) PT Visit Diagnosis: History of falling (Z91.81);Muscle weakness (generalized) (M62.81);Other abnormalities of gait and mobility (R26.89)    Functional Assessment Tool Used: AM-PAC 6 Clicks Basic Mobility Functional Limitation: Mobility: Walking and moving around Mobility: Walking and Moving Around Current Status [id number]): At least 60 percent but less than 80 percent impaired, limited or restricted Mobility: Walking and Moving Around Goal Status [phone]): At least 40 percent but less than 60 percent impaired, limited or restricted    Time: 1318-1400 PT Time Calculation (min) (ACUTE ONLY): 42 min   Charges:   PT Evaluation $PT Eval Low Complexity: 1 Procedure PT Treatments $Therapeutic Activity: 8-22 mins $Self Care/Home Management: 8-22   PT G Codes:   PT G-Codes **NOT FOR INPATIENT CLASS** Functional Assessment Tool Used: AM-PAC 6 Clicks Basic Mobility Functional Limitation:  Mobility: Walking and moving around Mobility: Walking and Moving Around Current Status [phone]): At least 60 percent but less than 80 percent impaired, limited or restricted Mobility: Walking and Moving Around Goal Status [phone]): At least 40 percent but less than 60 percent impaired, limited or restricted     Beth Zacheriah Stumpe, PT, DPT X: [phone]

## 2016-03-01 NOTE — Care Management Important Message (Signed)
Important Message  Patient Details  Name: Sherry [name] MRN: [id number] Date of Birth: [birth year]   Medicare Important Message Given:  Yes    Fleurette Woolbright, Chauncey Reading, RN 03/01/2016, 4:11 PM

## 2016-03-01 NOTE — Progress Notes (Signed)
-  Patient has no complaints. She denies chest pain shortness of breath or abdominal pain. -According to nursing staff she ate more than 50% of her meals. - Melena or rectal bleeding reported. - Hemoglobin this morning was 9.5. - Hemoccult is still pending.  Assessment: Patient is doing well from GI standpoint. Hemoglobin is slowly drifting down but no evidence of active bleeding. Patient's hemoglobin will need to be checked every weeks for the next 4 weeks. I have talked with patient's daughter [name]. Patient experiences melena or rectal bleeding she will need to be brought back to ER for evaluation since she is on anticoagulants. PPI dose could be reduced to once a day after 8 weeks.

## 2016-03-01 NOTE — Progress Notes (Signed)
Patient to be discharged. Awaiting EMS to pick patient up around 7 PM. Went over paper work with patient and daughter.

## 2016-03-01 NOTE — Progress Notes (Signed)
Patient ID: Sherry [name], female   DOB: Oct 16, 1927, 83 y.o.   MRN: [id number] She has no GI complaints. No BM during the night. She says her appetite has remained good. K. Is low this am and will be received 3 run of Potassium ans well as Magnesium. CBC    Component Value Date/Time   WBC 5.1 03/01/2016 0516   RBC 3.39 (L) 03/01/2016 0516   HGB 9.5 (L) 03/01/2016 0516   HGB 12.7 10/21/2012 1333   HCT 30.2 (L) 03/01/2016 0516   HCT 38.0 10/21/2012 1333   PLT 181 03/01/2016 0516   PLT 226 10/21/2012 1333   MCV 89.1 03/01/2016 0516   MCV 86 10/21/2012 1333   MCH 28.0 03/01/2016 0516   MCHC 31.5 03/01/2016 0516   RDW 14.0 03/01/2016 0516   RDW 13.9 10/21/2012 1333   LYMPHSABS 1.7 11/05/2015 0913   MONOABS 0.5 11/05/2015 0913   EOSABS 0.2 11/05/2015 0913   BASOSABS 0.0 11/05/2015 0913    Blood pressure (!) 137/45, pulse 82, temperature 98.6 F (37 C), temperature source Oral, resp. rate 18, height 5\' 4"  (1.626 m), weight 176 lb 12.9 oz (80.2 kg), SpO2 95 %. Will continue to follow. Stool specimens for occult blood are pending.

## 2016-03-01 NOTE — Progress Notes (Signed)
SATURATION QUALIFICATIONS: (This note is used to comply with regulatory documentation for home oxygen)  Patient Saturations on Room Air at Rest = 84%  Patient Saturations on Room Air while Ambulating = 80 %  Patient Saturations on 2 Liters of oxygen while Ambulating = 93%

## 2016-03-01 NOTE — Discharge Instructions (Signed)
Eliquis: Take 10 mg twice a day for 7 days and then decrease to 5 mg twice a day.

## 2016-03-01 NOTE — Progress Notes (Signed)
  CRITICAL VALUE ALERT  Critical value received:  K+ 2.4  Date of notification:  03/01/2016  Time of notification:  0625  Critical value read back:  yes  Nurse who received alert:  [email or web address]  MD notified (1st page): Opyd   Time of first page:  0630  MD notified (2nd page):  Time of second page:  Responding MD:   Time MD responded:

## 2016-03-01 NOTE — Discharge Summary (Signed)
Physician Discharge Summary  Sherry [name] [id number] DOB: 12/22/1927 DOA: 02/26/2016  PCP: [facility] date: 02/26/2016 Discharge date: 03/01/2016  Time spent: 45 minutes  Recommendations for Outpatient Follow-up:  -Will be discharged home today. -Advised to follow up with PCP in 2 weeks. Will need Hb check at that time. -Discharged on KCl 20 meq BID for 4 days. K level should be checked at hospital follow up appointment.   Discharge Diagnoses:  Principal Problem:   PE (pulmonary thromboembolism) ([place]) Active Problems:   Diabetes mellitus, type 2 (HCC)   Anemia   Chronic kidney disease (CKD), stage III (moderate)   Essential hypertension   Discharge Condition: Stable and improved  Filed Weights   02/26/16 2147 02/27/16 0517 02/29/16 0500  Weight: 80.3 kg (177 lb 0.5 oz) 80.9 kg (178 lb 5.6 oz) 80.2 kg (176 lb 12.9 oz)    History of present illness:  As per Dr. Lorin Mercy on 2/17: Sherry [name] is a 83 y.o. female with medical history significant of DM, HLD, stage 3 CKD and HTN who was admitted to [facility] from 1/21-29/18 for septic shock resulting from Proteus bacteremia.  She required Levophed but was able to be weaned off this medication and improved. She was discharged to SNF at [facility].  No records from the SNF are available but the patient reports that she requested discharge to home today.  However, upon arriving at home she was unable to climb the steps when she got home, her legs wouldn't work.  Slight SOB.  Mild chest pain, substernal, with exertion.  Has not noticed blood in her stools.  No reported h/o anemia.  No h/o GI bleeding.  In the ER today, she was noted to be pale and was initially thought to have HCAP and so was given Cefepime and Vancomycin; her WBC count was improved.  When the blood work returned, she was found to be anemic and although she had soft brown stool, it was trace heme positive.  Meanwhile,  qith her SOB a D-dimer was obtained and was positive.  Subsequent chest CT showed multiple pulmonary emboli.  Hospital Course:   Pulmonary embolus -Likely due to recent prolonged hospital stay with critical illness. -Heparin drip has been transitioned over to Eliquis. She will need treatment with anticoagulation for at the very least 6-9 months. -No signs of right heart strain, no oxygen requirements.  Anemia -Positive stool, likely due to chronic blood loss. -Received 2 units of PRBCs due to a hemoglobin of 6.2. Hemoglobin has remained stable in the 9.5 range since transfusion. -Was seen in consultation by GI, no plans for further inpatient follow-up. Should have her hemoglobin rechecked at time of hospital follow up in 1-2 weeks.  Rest of chronic medical conditions have been stable.  Procedures:  None   Consultations:  Gastroenterology  Discharge Instructions  Discharge Instructions    Diet - low sodium heart healthy    Complete by:  As directed    Increase activity slowly    Complete by:  As directed      Allergies as of 03/01/2016   No Known Allergies     Medication List    TAKE these medications   acetaminophen 325 MG tablet Commonly known as:  TYLENOL Take 650 mg by mouth every 4 (four) hours as needed for mild pain or moderate pain.   apixaban 5 MG Tabs tablet Commonly known as:  ELIQUIS Take 2 tablets (10  mg total) by mouth 2 (two) times daily.   apixaban 5 MG Tabs tablet Commonly known as:  ELIQUIS Take 1 tablet (5 mg total) by mouth 2 (two) times daily. Start taking on:  03/08/2016   atorvastatin 20 MG tablet Commonly known as:  LIPITOR Take 1 tablet by mouth at bedtime.   feeding supplement (ENSURE ENLIVE) Liqd Take 237 mLs by mouth 3 (three) times daily with meals.   ferrous sulfate 325 (65 FE) MG tablet Take 325 mg by mouth daily with breakfast.   gabapentin 300 MG capsule Commonly known as:  NEURONTIN Take 1 capsule by mouth at bedtime.     glipiZIDE 10 MG 24 hr tablet Commonly known as:  GLUCOTROL XL Take 1 tablet by mouth daily.   insulin detemir 100 UNIT/ML injection Commonly known as:  LEVEMIR Inject 13 Units into the skin every morning.   ondansetron 4 MG tablet Commonly known as:  ZOFRAN Take 4 mg by mouth 2 (two) times daily. *May take every 6 hours as needed for nausea and vomiting   polyethylene glycol powder powder Commonly known as:  GLYCOLAX/MIRALAX Take 17 g by mouth every evening.   potassium chloride SA 20 MEQ tablet Commonly known as:  K-DUR,KLOR-CON Take 1 tablet (20 mEq total) by mouth 2 (two) times daily. For 4 days   T.E.D. KNEE LENGTH/M-LONG Misc 2 application by Does not apply route every morning.            Durable Medical Equipment        Start     Ordered   03/01/16 1413  For home use only DME standard manual wheelchair with seat cushion  Once    Comments:  Patient suffers from weakness which impairs their ability to perform daily activities like walking in the home.  A walker will not resolve  issue with performing activities of daily living. A wheelchair will allow patient to safely perform daily activities. Patient can safely propel the wheelchair in the home or has a caregiver who can provide assistance.  Accessories: elevating leg rests (ELRs), wheel locks, extensions and anti-tippers.   03/01/16 1413   03/01/16 1412  For home use only DME Hospital bed  Once    Question Answer Comment  The above medical condition requires: Patient requires the ability to reposition frequently   Head must be elevated greater than: 30 degrees   Bed type Semi-electric      03/01/16 1413   03/01/16 1331  For home use only DME oxygen  Once    Question Answer Comment  Mode or (Route) Nasal cannula   Liters per Minute 2   Frequency Continuous (stationary and portable oxygen unit needed)   Oxygen conserving device Yes   Oxygen delivery system Gas      03/01/16 1330     No Known  Allergies Follow-up Information    [facility]. Schedule an appointment as soon as possible for a visit in 2 week(s).   Specialty:  General Practice Contact information: [place] [address] [place] [facility].   Specialty:  Home Health Services Why:  home health services Contact information: [place] [address] [phone]            The results of significant diagnostics from this hospitalization (including imaging, microbiology, ancillary and laboratory) are listed below for reference.    Significant Diagnostic Studies: Dg Chest  2 View  Result Date: 02/26/2016 CLINICAL DATA:  Patient with weakness.  Shortness of breath. EXAM: CHEST  2 VIEW COMPARISON:  Chest radiograph 01/30/2016. FINDINGS: Anterior cervical spinal fusion hardware. Cardiomegaly. Monitoring leads overlie the patient. Patchy consolidation within the right mid and lower lung. Moderate right pleural effusion. Probable small left pleural effusion. IMPRESSION: Patchy consolidation within the right mid lower lung concerning for pneumonia in the appropriate clinical setting. Additional consideration would be atelectasis in the setting of pleural effusion. Moderate layering right pleural effusion. Probable small left pleural effusion. Electronically Signed   By: Lovey Newcomer M.D.   On: 02/26/2016 16:44   Ct Angio Chest Pe W And/or Wo Contrast  Result Date: 02/26/2016 CLINICAL DATA:  Patient with shortness of breath. Irregular heart rate. EXAM: CT ANGIOGRAPHY CHEST WITH CONTRAST TECHNIQUE: Multidetector CT imaging of the chest was performed using the standard protocol during bolus administration of intravenous contrast. Multiplanar CT image reconstructions and MIPs were obtained to evaluate the vascular anatomy. CONTRAST:  75 cc Isovue 370 COMPARISON:  Chest radiograph 02/26/2016 FINDINGS: Cardiovascular: Heart is enlarged. Small  pericardial effusion. Aorta and main pulmonary artery are normal in caliber. Filling defects are identified within the right lower lobe pulmonary arteries as well as left upper and left lower lobe pulmonary arteries. No CT evidence to suggest acute right heart strain. Coronary arterial vascular calcifications. Mediastinum/Nodes: No enlarged axillary, mediastinal or hilar lymphadenopathy. There is circumferential wall thickening of the mid and distal esophagus. Small hiatal hernia. Lungs/Pleura: Central airways are patent. Atelectasis of the right middle lobe. Consolidation within the right and left lower lobes. Moderate right and small left layering pleural effusions. No pneumothorax. Upper Abdomen: Liver is diffusely low in attenuation compatible with steatosis. Adrenal glands are normal. Musculoskeletal: Mid thoracic spine degenerative changes. No aggressive or acute appearing osseous lesions. Review of the MIP images confirms the above findings. IMPRESSION: Findings compatible with acute pulmonary embolus within the right lower lobe, left upper lobe and left lower lobe pulmonary arteries. No evidence for right heart strain. There is circumferential wall thickening of the mid and distal esophagus. Acute esophagitis is not excluded. Small hiatal hernia. Consolidation of the right middle lobe with subpleural consolidation in the right lower and left lower lobes which may represent atelectasis. Infection not excluded. Moderate right and small left layering pleural effusions. Hepatic steatosis. Aortic atherosclerosis. Critical Value/emergent results were called by telephone at the time of interpretation on 02/26/2016 at 7:22 pm to Dr. Dorie Rank , who verbally acknowledged these results. Electronically Signed   By: Lovey Newcomer M.D.   On: 02/26/2016 19:23   US Venous Img Lower Bilateral  Result Date: 02/27/2016 CLINICAL DATA:  Pulmonary thromboembolism yesterday EXAM: BILATERAL LOWER EXTREMITY VENOUS DUPLEX  ULTRASOUND TECHNIQUE: Doppler venous assessment of the left lower extremity deep venous system was performed, including characterization of spectral flow, compressibility, and phasicity. COMPARISON:  None. FINDINGS: There is partially occlusive thrombus in the right common femoral vein. The femoral and popliteal veins are compressible and are without thrombus. Doppler analysis demonstrates respiratory phasicity in the deep venous system of the right lower extremity. In the left lower extremity, there is complete compressibility of the left common femoral, femoral, and popliteal veins. There is partially occlusive thrombus within the posterior tibial vein of the left calf. There is also superficial vein thrombus in the left lesser saphenous vein. IMPRESSION: The study is positive for DVT. There is partially occlusive thrombus in the right common femoral vein and left posterior tibial  veins. The femoral and popliteal veins are patent bilaterally. Electronically Signed   By: Marybelle Killings M.D.   On: 02/27/2016 14:40    Microbiology: No results found for this or any previous visit (from the past 240 hour(s)).   Labs: Basic Metabolic Panel:  Recent Labs Lab 02/26/16 1634 02/27/16 0809 03/01/16 0516  NA 138 135 134*  K 3.2* 3.6 2.4*  CL 91* 97* 86*  CO2 36* 31 39*  GLUCOSE 106* 99 153*  BUN 22* 17 14  CREATININE 1.33* 1.14* 1.02*  CALCIUM 8.4* 7.5* 7.0*  MG  --   --  1.0*   Liver Function Tests: No results for input(s): AST, ALT, ALKPHOS, BILITOT, PROT, ALBUMIN in the last 168 hours. No results for input(s): LIPASE, AMYLASE in the last 168 hours. No results for input(s): AMMONIA in the last 168 hours. CBC:  Recent Labs Lab 02/27/16 2312 02/28/16 0419 02/28/16 0953 02/29/16 0439 03/01/16 0516  WBC 9.0 7.4 7.4 5.9 5.1  HGB 10.0* 9.7* 10.6* 9.9* 9.5*  HCT 29.8* 28.8* 32.1* 30.3* 30.2*  MCV 87.4 87.5 88.4 87.8 89.1  PLT 234 234 227 199 181   Cardiac Enzymes:  Recent Labs Lab  02/27/16 0810 02/27/16 1631 02/28/16 0953 02/28/16 1546 02/28/16 2105  TROPONINI <0.03 0.03* 0.03* <0.03 <0.03   BNP: BNP (last 3 results)  Recent Labs  11/05/15 0906 02/28/16 0953  BNP 27.0 217.0*    ProBNP (last 3 results) No results for input(s): PROBNP in the last 8760 hours.  CBG:  Recent Labs Lab 02/29/16 1644 02/29/16 2113 03/01/16 0734 03/01/16 1114 03/01/16 1604  GLUCAP 178* 163* 168* 184* 210*       Signed:  HERNANDEZ ACOSTA,Saidee Geremia  [facility] Pager: [phone] 03/01/2016, 4:43 PM

## 2016-03-01 NOTE — Care Management (Addendum)
PT recommends hospital bed and WC. CM contacted [facility], who will deliver equipment to house. CM left message on family voicemail.   Later entry: CM spoke with daughter, notified of equipment delivery. Family request EMS transport be arranged for 1900. EMS and RN updated.

## 2016-03-01 NOTE — Care Management Note (Signed)
Case Management Note  Patient Details  Name: ASLEE EDLIN MRN: [id number] Date of Birth: May 24, 1927     Expected Discharge Date:  03/01/16               Expected Discharge Plan:  [place]  In-House Referral:     Discharge planning Services  CM Consult  Post Acute Care Choice:  Durable Medical Equipment, Home Health Choice offered to:  Patient, Adult Children  DME Arranged:  Oxygen DME Agency:  [facility] Arranged:  RN, PT [facility] Agency:  [facility] (now [facility])  Status of Service:  Completed, signed off  If discussed at [place] of Stay Meetings, dates discussed:    Additional Comments: Patient discharging today. She will need oxygen, [facility] will deliver to room prior to discharge. Family at bedside, wants to take patient home, daughter states she will not let her mother go back to a rehab facility. CM has spoke with multiple people at [facility] regarding [facility] referral that was sent by previous rehab before discharge home. [facility] states they do not have a referral for patient and that they do not service [place] where patient will be living. CM will refer to [facility] for [facility] RN and PT.  Family agreeable, [facility] added to AVS. CM will give Eliquis 30 day free trial to daughter, [name].   Deepa Barthel, [place]un[place]y [place], RN 03/01/2016, 1:06 PM

## 2016-03-01 NOTE — Progress Notes (Signed)
ANTICOAGULATION CONSULT NOTE   Pharmacy Consult for Heparin Indication: pulmonary embolus  No Known Allergies  Patient Measurements: Height: 5\' 4"  (162.6 cm) Weight: 176 lb 12.9 oz (80.2 kg) IBW/kg (Calculated) : 54.7 Heparin Dosing Weight: 68.5 kg  Vital Signs: Temp: 98.6 F (37 C) (02/21 0701) Temp Source: Oral (02/21 0701) BP: 137/45 (02/21 0701) Pulse Rate: 82 (02/21 0701)  Labs:  Recent Labs  02/27/16 0809  02/28/16 0419 02/28/16 0953 02/28/16 1546 02/28/16 2105 02/29/16 0439 03/01/16 0516  HGB 11.0*  < > 9.7* 10.6*  --   --  9.9* 9.5*  HCT 33.0*  < > 28.8* 32.1*  --   --  30.3* 30.2*  PLT 190  < > 234 227  --   --  199 181  LABPROT 14.3  --   --   --   --   --   --   --   INR 1.10  --   --   --   --   --   --   --   HEPARINUNFRC 0.55  < > 0.35  --   --   --  0.39 0.27*  CREATININE 1.14*  --   --   --   --   --   --  1.02*  TROPONINI  --   < >  --  0.03* <0.03 <0.03  --   --   < > = values in this interval not displayed.  Estimated Creatinine Clearance: 40.6 mL/min (by C-G formula based on SCr of 1.02 mg/dL (H)).   Medical History: Past Medical History:  Diagnosis Date  . Diabetes mellitus without complication ([place])   . Hyperlipidemia   . Hypertension     Medications:  Prescriptions Prior to Admission  Medication Sig Dispense Refill Last Dose  . acetaminophen (TYLENOL) 325 MG tablet Take 650 mg by mouth every 4 (four) hours as needed for mild pain or moderate pain.   unknown  . atorvastatin (LIPITOR) 20 MG tablet Take 1 tablet by mouth at bedtime.   02/25/2016 at Unknown time  . ferrous sulfate 325 (65 FE) MG tablet Take 325 mg by mouth daily with breakfast.   02/26/2016 at Unknown time  . gabapentin (NEURONTIN) 300 MG capsule Take 1 capsule by mouth at bedtime.   02/25/2016 at Unknown time  . glipiZIDE (GLUCOTROL XL) 10 MG 24 hr tablet Take 1 tablet by mouth daily.   02/26/2016 at Unknown time  . insulin detemir (LEVEMIR) 100 UNIT/ML injection Inject 13  Units into the skin every morning.   02/26/2016 at Unknown time  . ondansetron (ZOFRAN) 4 MG tablet Take 4 mg by mouth 2 (two) times daily. *May take every 6 hours as needed for nausea and vomiting   02/26/2016 at Unknown time  . polyethylene glycol powder (GLYCOLAX/MIRALAX) powder Take 17 g by mouth every evening.   02/25/2016 at Unknown time  . Elastic Bandages & Supports (T.E.D. KNEE LENGTH/M-LONG) MISC 2 application by Does not apply route every morning. 2 each 0   . feeding supplement, ENSURE ENLIVE, (ENSURE ENLIVE) LIQD Take 237 mLs by mouth 3 (three) times daily with meals. 237 mL 12    Assessment: 83 yo female, Findings compatible with acute pulmonary embolus within the right lower lobe, left upper lobe and left lower lobe pulmonary arteries. Heparin level this AM just slightly below goal.   H/H has decreased a little this AM.  Goal of Therapy:  Heparin level 0.3-0.7 units/ml Monitor platelets by anticoagulation  protocol: Yes   Plan:  Heparin 1000 unit bolus, then increase heparin infusion to 1200 units/hour Heparin level in 8 hours and daily Monitor CBC, platelets F/U plans for oral AC  Isac Sarna, BS [place], BCPS Clinical Pharmacist Pager [phone] 03/01/2016,7:44 AM

## 2016-03-01 NOTE — Progress Notes (Signed)
[place] for Eliquis Indication: pulmonary embolus  No Known Allergies  Patient Measurements: Height: 5\' 4"  (162.6 cm) Weight: 176 lb 12.9 oz (80.2 kg) IBW/kg (Calculated) : 54.7 Heparin Dosing Weight: 68.5 kg  Vital Signs: Temp: 98.6 F (37 C) (02/21 0701) Temp Source: Oral (02/21 0701) BP: 137/45 (02/21 0701) Pulse Rate: 82 (02/21 0701)  Labs:  Recent Labs  02/28/16 0419 02/28/16 0953 02/28/16 1546 02/28/16 2105 02/29/16 0439 03/01/16 0516  HGB 9.7* 10.6*  --   --  9.9* 9.5*  HCT 28.8* 32.1*  --   --  30.3* 30.2*  PLT 234 227  --   --  199 181  HEPARINUNFRC 0.35  --   --   --  0.39 0.27*  CREATININE  --   --   --   --   --  1.02*  TROPONINI  --  0.03* <0.03 <0.03  --   --     Estimated Creatinine Clearance: 40.6 mL/min (by C-G formula based on SCr of 1.02 mg/dL (H)).   Medical History: Past Medical History:  Diagnosis Date  . Diabetes mellitus without complication ([place])   . Hyperlipidemia   . Hypertension     Medications:  Prescriptions Prior to Admission  Medication Sig Dispense Refill Last Dose  . acetaminophen (TYLENOL) 325 MG tablet Take 650 mg by mouth every 4 (four) hours as needed for mild pain or moderate pain.   unknown  . atorvastatin (LIPITOR) 20 MG tablet Take 1 tablet by mouth at bedtime.   02/25/2016 at Unknown time  . ferrous sulfate 325 (65 FE) MG tablet Take 325 mg by mouth daily with breakfast.   02/26/2016 at Unknown time  . gabapentin (NEURONTIN) 300 MG capsule Take 1 capsule by mouth at bedtime.   02/25/2016 at Unknown time  . glipiZIDE (GLUCOTROL XL) 10 MG 24 hr tablet Take 1 tablet by mouth daily.   02/26/2016 at Unknown time  . insulin detemir (LEVEMIR) 100 UNIT/ML injection Inject 13 Units into the skin every morning.   02/26/2016 at Unknown time  . ondansetron (ZOFRAN) 4 MG tablet Take 4 mg by mouth 2 (two) times daily. *May take every 6 hours as needed for nausea and vomiting   02/26/2016 at  Unknown time  . polyethylene glycol powder (GLYCOLAX/MIRALAX) powder Take 17 g by mouth every evening.   02/25/2016 at Unknown time  . Elastic Bandages & Supports (T.E.D. KNEE LENGTH/M-LONG) MISC 2 application by Does not apply route every morning. 2 each 0   . feeding supplement, ENSURE ENLIVE, (ENSURE ENLIVE) LIQD Take 237 mLs by mouth 3 (three) times daily with meals. 237 mL 12    Assessment: 83 yo female, Findings compatible with acute pulmonary embolus within the right lower lobe, left upper lobe and left lower lobe pulmonary arteries. Plan is to transition to eliquis for treatment of PE.    Goal of Therapy:  Monitor platelets by anticoagulation protocol: Yes   Plan:  Eliquis 10mg  po BID for 7 days, then 5mg  po bid Monitor for S/S of bleeding Educate patient on eliquis  Sherry [name], BS [place], BCPS Clinical Pharmacist Pager [phone] 03/01/2016,11:48 AM

## 2016-03-02 ENCOUNTER — Telehealth (INDEPENDENT_AMBULATORY_CARE_PROVIDER_SITE_OTHER): Payer: Self-pay | Admitting: Internal Medicine

## 2016-03-02 ENCOUNTER — Telehealth: Payer: Self-pay | Admitting: Internal Medicine

## 2016-03-02 NOTE — Telephone Encounter (Signed)
Patient's daughter [name] called, stated that she is wanting a prescription for her mother for [facility] for a wheelchair and a hospital bed.  She stated that they need this ASAP  [phone]

## 2016-03-02 NOTE — Telephone Encounter (Signed)
[phone]  °

## 2016-03-02 NOTE — Telephone Encounter (Signed)
I spoke with [name] and she is aware that she called the wrong office and apologized. I gave her NUR office number.

## 2016-03-02 NOTE — Telephone Encounter (Signed)
Sherry [name], pt is a Dr.Rehman pt. We have never seen her. Please tell her to call his office to see if they will do that for her.

## 2016-03-02 NOTE — Telephone Encounter (Signed)
Does patient have me confused with another physician?

## 2016-03-02 NOTE — Telephone Encounter (Signed)
[name] called for patient saying that [facility] needed to write a prescription for her to get a wheelchair and a hospital bed and that [place] needed it done today.

## 2016-03-06 NOTE — Telephone Encounter (Signed)
[facility]  will need to write a Rx for wheelchair and hospital bed.

## 2016-03-07 ENCOUNTER — Encounter (HOSPITAL_COMMUNITY): Payer: Self-pay | Admitting: Cardiology

## 2016-03-07 ENCOUNTER — Emergency Department (HOSPITAL_COMMUNITY): Payer: Medicare Other

## 2016-03-07 ENCOUNTER — Emergency Department (HOSPITAL_COMMUNITY)
Admission: EM | Admit: 2016-03-07 | Discharge: 2016-03-07 | Disposition: A | Discharge status: Home / Self Care | Payer: Medicare Other | Attending: Emergency Medicine | Admitting: Emergency Medicine

## 2016-03-07 DIAGNOSIS — R0600 Dyspnea, unspecified: Secondary | ICD-10-CM | POA: Insufficient documentation

## 2016-03-07 DIAGNOSIS — N183 Chronic kidney disease, stage 3 (moderate): Secondary | ICD-10-CM | POA: Insufficient documentation

## 2016-03-07 DIAGNOSIS — Z79899 Other long term (current) drug therapy: Secondary | ICD-10-CM | POA: Insufficient documentation

## 2016-03-07 DIAGNOSIS — I129 Hypertensive chronic kidney disease with stage 1 through stage 4 chronic kidney disease, or unspecified chronic kidney disease: Secondary | ICD-10-CM | POA: Insufficient documentation

## 2016-03-07 DIAGNOSIS — Z794 Long term (current) use of insulin: Secondary | ICD-10-CM | POA: Insufficient documentation

## 2016-03-07 DIAGNOSIS — R531 Weakness: Secondary | ICD-10-CM | POA: Diagnosis present

## 2016-03-07 DIAGNOSIS — E1165 Type 2 diabetes mellitus with hyperglycemia: Secondary | ICD-10-CM | POA: Insufficient documentation

## 2016-03-07 DIAGNOSIS — E1122 Type 2 diabetes mellitus with diabetic chronic kidney disease: Secondary | ICD-10-CM | POA: Diagnosis not present

## 2016-03-07 DIAGNOSIS — R7309 Other abnormal glucose: Secondary | ICD-10-CM

## 2016-03-07 LAB — CBC WITH DIFFERENTIAL/PLATELET
BASOS ABS: 0 10*3/uL (ref 0.0–0.1)
Basophils Relative: 0 %
Eosinophils Absolute: 0 10*3/uL (ref 0.0–0.7)
Eosinophils Relative: 0 %
HEMATOCRIT: 31.2 % — AB (ref 36.0–46.0)
Hemoglobin: 10.2 g/dL — ABNORMAL LOW (ref 12.0–15.0)
LYMPHS PCT: 32 %
Lymphs Abs: 2 10*3/uL (ref 0.7–4.0)
MCH: 28.3 pg (ref 26.0–34.0)
MCHC: 32.7 g/dL (ref 30.0–36.0)
MCV: 86.7 fL (ref 78.0–100.0)
MONO ABS: 0.4 10*3/uL (ref 0.1–1.0)
Monocytes Relative: 7 %
NEUTROS ABS: 3.8 10*3/uL (ref 1.7–7.7)
Neutrophils Relative %: 61 %
PLATELETS: 256 10*3/uL (ref 150–400)
RBC: 3.6 MIL/uL — AB (ref 3.87–5.11)
RDW: 14.5 % (ref 11.5–15.5)
WBC: 6.2 10*3/uL (ref 4.0–10.5)

## 2016-03-07 LAB — COMPREHENSIVE METABOLIC PANEL
ALBUMIN: 2.3 g/dL — AB (ref 3.5–5.0)
ALT: 12 U/L — AB (ref 14–54)
AST: 16 U/L (ref 15–41)
Alkaline Phosphatase: 64 U/L (ref 38–126)
Anion gap: 9 (ref 5–15)
BUN: 25 mg/dL — AB (ref 6–20)
CHLORIDE: 87 mmol/L — AB (ref 101–111)
CO2: 36 mmol/L — ABNORMAL HIGH (ref 22–32)
Calcium: 8.1 mg/dL — ABNORMAL LOW (ref 8.9–10.3)
Creatinine, Ser: 1.26 mg/dL — ABNORMAL HIGH (ref 0.44–1.00)
GFR calc Af Amer: 43 mL/min — ABNORMAL LOW (ref >60)
GFR calc non Af Amer: 37 mL/min — ABNORMAL LOW (ref >60)
GLUCOSE: 240 mg/dL — AB (ref 65–99)
POTASSIUM: 3.3 mmol/L — AB (ref 3.5–5.1)
Sodium: 132 mmol/L — ABNORMAL LOW (ref 135–145)
Total Bilirubin: 0.5 mg/dL (ref 0.3–1.2)
Total Protein: 5.6 g/dL — ABNORMAL LOW (ref 6.5–8.1)

## 2016-03-07 LAB — TROPONIN I: Troponin I: <0.03 ng/mL (ref <0.03)

## 2016-03-07 LAB — CBG MONITORING, ED: GLUCOSE-CAPILLARY: 274 mg/dL — AB (ref 65–99)

## 2016-03-07 LAB — D-DIMER, QUANTITATIVE: D-Dimer, Quant: 1.39 ug/mL-FEU — ABNORMAL HIGH (ref 0.00–0.50)

## 2016-03-07 LAB — BRAIN NATRIURETIC PEPTIDE: B Natriuretic Peptide: 87 pg/mL (ref 0.0–100.0)

## 2016-03-07 NOTE — ED Notes (Signed)
Pt transported home via EMS

## 2016-03-07 NOTE — ED Notes (Signed)
Unable to get in touch with daughter.  Per neighbor this is very unusual.  Requested wellness check through c-com

## 2016-03-07 NOTE — ED Triage Notes (Addendum)
EMS called out for elevated glucose and weakness.  cbg 345 with EMS.  Lower extremity Edema.

## 2016-03-07 NOTE — Discharge Instructions (Signed)
Follow up with your family md this week °

## 2016-03-07 NOTE — ED Notes (Signed)
Daughter called requesting to have pt admitted to ICU.  Informed daughter that we did not have sufficient cause to admit pt to hospital much less ICU.  Daughter then requested that we take pt to [facility].  Informed daughter we are unable to admit to long term care from ED.  Daughter states that EMS will have to transport pt home.  EMS contacted to transport pt.    Daughter called back and asked for pt to be taken to [facility].  Informed daughter that pt has been discharged and EMS contacted to return pt home.  If she believes pt needs to be evaluated at [facility] she should transport her there for treatment.  Daughter became angry and hung up on nurse

## 2016-03-07 NOTE — ED Notes (Signed)
Discharge instructions reviewed with pt. Daughter called to pick pt up, no answer x several attempts. Vinnie Level, pt's neighbor, called but she is unable to pick pt up but said she will attempt to get in touch with pt's daughter. Pt aware.

## 2016-03-07 NOTE — ED Provider Notes (Signed)
[facility] DEPT Provider Note   CSN: [id number] Arrival date & time: 03/07/16  1538     History   Chief Complaint Chief Complaint  Patient presents with  . Weakness    HPI Sherry [name] is a 83 y.o. female.  Patient complains that her sugars been elevated and she feels weak. Patient discussed the hospital last week with a diagnosis of anemia and a PE. She is taking liquids.   The history is provided by the patient. No language interpreter was used.  Weakness  Primary symptoms include no focal weakness. This is a recurrent problem. The current episode started more than 2 days ago. There was no focality noted. There has been no fever. Pertinent negatives include no shortness of breath, no chest pain and no headaches. There were no medications administered prior to arrival. Associated medical issues do not include trauma.    Past Medical History:  Diagnosis Date  . Diabetes mellitus without complication ([place])   . Hyperlipidemia   . Hypertension     Patient Active Problem List   Diagnosis Date Noted  . PE (pulmonary thromboembolism) ([place]) 02/26/2016  . Diabetes mellitus, type 2 ([place]) 02/26/2016  . Anemia 02/26/2016  . Chronic kidney disease (CKD), stage III (moderate) 02/26/2016  . Essential hypertension 02/26/2016  . Goals of care, counseling/discussion   . Adult failure to thrive syndrome   . DNR (do not resuscitate)   . Palliative care by specialist   . Sepsis ([place]) 11/26/2014    Past Surgical History:  Procedure Laterality Date  . BACK SURGERY    . SHOULDER SURGERY      OB History    No data available       Home Medications    Prior to Admission medications   Medication Sig Start Date End Date Taking? Authorizing Provider  amLODipine (NORVASC) 5 MG tablet Take 5 mg by mouth daily.   Yes Historical Provider, MD  apixaban (ELIQUIS) 5 MG TABS tablet Take 1 tablet (5 mg total) by mouth 2 (two) times daily. 03/08/16  Yes Erline Hau, MD    atorvastatin (LIPITOR) 20 MG tablet Take 1 tablet by mouth at bedtime.   Yes Historical Provider, MD  gabapentin (NEURONTIN) 300 MG capsule Take 1 capsule by mouth at bedtime.   Yes Historical Provider, MD  glipiZIDE (GLUCOTROL XL) 10 MG 24 hr tablet Take 1 tablet by mouth daily.   Yes Historical Provider, MD  lisinopril-hydrochlorothiazide (PRINZIDE,ZESTORETIC) 20-12.5 MG tablet Take 1 tablet by mouth daily.   Yes Historical Provider, MD  meloxicam (MOBIC) 7.5 MG tablet Take 7.5 mg by mouth daily.   Yes Historical Provider, MD  metFORMIN (GLUCOPHAGE) 1000 MG tablet Take 1,000 mg by mouth 2 (two) times daily with a meal.   Yes Historical Provider, MD  ondansetron (ZOFRAN) 4 MG tablet Take 4 mg by mouth 2 (two) times daily. *May take every 8 hours as needed for nausea and vomiting   Yes Historical Provider, MD  potassium chloride SA (K-DUR,KLOR-CON) 20 MEQ tablet Take 1 tablet (20 mEq total) by mouth 2 (two) times daily. For 4 days 03/01/16  Yes Estela Leonie Green, MD  acetaminophen (TYLENOL) 325 MG tablet Take 650 mg by mouth every 4 (four) hours as needed for mild pain or moderate pain.    Historical Provider, MD  apixaban (ELIQUIS) 5 MG TABS tablet Take 2 tablets (10 mg total) by mouth 2 (two) times daily. 03/01/16   Erline Hau, MD  feeding supplement, ENSURE ENLIVE, (ENSURE ENLIVE) LIQD Take 237 mLs by mouth 3 (three) times daily with meals. 02/07/16   Demetrios Loll, MD  ferrous sulfate 325 (65 FE) MG tablet Take 325 mg by mouth daily with breakfast.    Historical Provider, MD  insulin detemir (LEVEMIR) 100 UNIT/ML injection Inject 13 Units into the skin every morning.    Historical Provider, MD  polyethylene glycol powder (GLYCOLAX/MIRALAX) powder Take 17 g by mouth every evening.    Historical Provider, MD    Family History History reviewed. No pertinent family history.  Social History Social History  Substance Use Topics  . Smoking status: Never Smoker  . Smokeless  tobacco: Never Used  . Alcohol use No     Allergies   Patient has no known allergies.   Review of Systems Review of Systems  Constitutional: Negative for appetite change and fatigue.  HENT: Negative for congestion, ear discharge and sinus pressure.   Eyes: Negative for discharge.  Respiratory: Negative for cough and shortness of breath.   Cardiovascular: Negative for chest pain.  Gastrointestinal: Negative for abdominal pain and diarrhea.  Genitourinary: Negative for frequency and hematuria.  Musculoskeletal: Negative for back pain.  Skin: Negative for rash.  Neurological: Positive for weakness. Negative for focal weakness, seizures and headaches.  Psychiatric/Behavioral: Negative for hallucinations.     Physical Exam Updated Vital Signs BP (!) 104/44   Pulse 80   Temp 98.2 F (36.8 C) (Oral)   Resp 20   Ht 5\' 4"  (1.626 m)   Wt 160 lb (72.6 kg)   SpO2 95%   BMI 27.46 kg/m   Physical Exam  Constitutional: She is oriented to person, place, and time. She appears well-developed.  HENT:  Head: Normocephalic.  Eyes: Conjunctivae and EOM are normal. No scleral icterus.  Neck: Neck supple. No thyromegaly present.  Cardiovascular: Normal rate and regular rhythm.  Exam reveals no gallop and no friction rub.   No murmur heard. Pulmonary/Chest: No stridor. She has no wheezes. She has no rales. She exhibits no tenderness.  Abdominal: She exhibits no distension. There is no tenderness. There is no rebound.  Musculoskeletal: Normal range of motion. She exhibits no edema.  Lymphadenopathy:    She has no cervical adenopathy.  Neurological: She is oriented to person, place, and time. She exhibits normal muscle tone. Coordination normal.  Skin: No rash noted. No erythema.  Psychiatric: She has a normal mood and affect. Her behavior is normal.     ED Treatments / Results  Labs (all labs ordered are listed, but only abnormal results are displayed) Labs Reviewed  CBC WITH  DIFFERENTIAL/PLATELET - Abnormal; Notable for the following:       Result Value   RBC 3.60 (*)    Hemoglobin 10.2 (*)    HCT 31.2 (*)    All other components within normal limits  COMPREHENSIVE METABOLIC PANEL - Abnormal; Notable for the following:    Sodium 132 (*)    Potassium 3.3 (*)    Chloride 87 (*)    CO2 36 (*)    Glucose, Bld 240 (*)    BUN 25 (*)    Creatinine, Ser 1.26 (*)    Calcium 8.1 (*)    Total Protein 5.6 (*)    Albumin 2.3 (*)    ALT 12 (*)    GFR calc non Af Amer 37 (*)    GFR calc Af Amer 43 (*)    All other components within normal limits  D-DIMER, QUANTITATIVE (NOT AT [facility]) - Abnormal; Notable for the following:    D-Dimer, Quant 1.39 (*)    All other components within normal limits  CBG MONITORING, ED - Abnormal; Notable for the following:    Glucose-Capillary 274 (*)    All other components within normal limits  BRAIN NATRIURETIC PEPTIDE  TROPONIN I    EKG  EKG Interpretation  Date/Time:  Tuesday March 07 2016 15:47:31 EST Ventricular Rate:  85 PR Interval:    QRS Duration: 151 QT Interval:  404 QTC Calculation: 481 R Axis:   19 Text Interpretation:  Sinus or ectopic atrial rhythm Left bundle branch block Confirmed by Ramir Malerba  MD, [facility] [phone]) on 03/07/2016 5:41:11 PM       Radiology Dg Chest 2 View  Result Date: 03/07/2016 CLINICAL DATA:  Hyperglycemia, dyspnea and weakness. Diabetes and hypertension. History of recent pulmonary embolus. EXAM: CHEST  2 VIEW COMPARISON:  February 26, 2016 chest CT and CXR FINDINGS: Low lung volumes are noted on current exam. Stable cardiomegaly with aortic atherosclerosis. Moderate right effusion with adjacent compressive atelectasis without significant change allowing for low lung volumes. It appears slightly greater on the frontal projection does smaller on the lateral since previous. Small left pleural effusion. No overt pulmonary edema or pneumothorax. ACDF of the cervical spine. Osteoarthritis of both  glenohumeral and AC joints. Chronic stable degenerate change of the dorsal spine. IMPRESSION: 1. Low lung volumes on current exam with chronic moderate right and small left pleural effusions. Likely adjacent to compressive atelectasis due to the pleural effusions bilaterally. 2. Aortic atherosclerosis. Electronically Signed   By: Ashley Royalty M.D.   On: 03/07/2016 17:35    Procedures Procedures (including critical care time)  Medications Ordered in ED Medications - No data to display   Initial Impression / Assessment and Plan / ED Course  I have reviewed the triage vital signs and the nursing notes.  Pertinent labs & imaging results that were available during my care of the patient were reviewed by me and considered in my medical decision making (see chart for details).     Patient with mildly elevated glucose. And weakness. Patient is being treated for PE. O2 sats low 90s. Labs shows mild dehydration. Patient will be discharged home to follow-up with her PCP  Final Clinical Impressions(s) / ED Diagnoses   Final diagnoses:  Elevated glucose    New Prescriptions New Prescriptions   No medications on file     Milton Ferguson, MD 03/07/16 1757

## 2016-03-09 ENCOUNTER — Inpatient Hospital Stay
Admission: EM | Admit: 2016-03-09 | Discharge: 2016-03-13 | DRG: 392 | Disposition: A | Discharge status: Skilled Nursing Facility | Payer: Medicare Other | Attending: Internal Medicine | Admitting: Internal Medicine

## 2016-03-09 ENCOUNTER — Emergency Department: Payer: Medicare Other

## 2016-03-09 ENCOUNTER — Encounter: Payer: Self-pay | Admitting: Emergency Medicine

## 2016-03-09 DIAGNOSIS — E1122 Type 2 diabetes mellitus with diabetic chronic kidney disease: Secondary | ICD-10-CM | POA: Diagnosis present

## 2016-03-09 DIAGNOSIS — E785 Hyperlipidemia, unspecified: Secondary | ICD-10-CM | POA: Diagnosis present

## 2016-03-09 DIAGNOSIS — R627 Adult failure to thrive: Secondary | ICD-10-CM

## 2016-03-09 DIAGNOSIS — Z515 Encounter for palliative care: Secondary | ICD-10-CM

## 2016-03-09 DIAGNOSIS — R339 Retention of urine, unspecified: Secondary | ICD-10-CM | POA: Diagnosis present

## 2016-03-09 DIAGNOSIS — I447 Left bundle-branch block, unspecified: Secondary | ICD-10-CM | POA: Diagnosis present

## 2016-03-09 DIAGNOSIS — K209 Esophagitis, unspecified: Principal | ICD-10-CM | POA: Diagnosis present

## 2016-03-09 DIAGNOSIS — C651 Malignant neoplasm of right renal pelvis: Secondary | ICD-10-CM | POA: Diagnosis present

## 2016-03-09 DIAGNOSIS — R0789 Other chest pain: Secondary | ICD-10-CM

## 2016-03-09 DIAGNOSIS — J90 Pleural effusion, not elsewhere classified: Secondary | ICD-10-CM | POA: Diagnosis not present

## 2016-03-09 DIAGNOSIS — R079 Chest pain, unspecified: Secondary | ICD-10-CM

## 2016-03-09 DIAGNOSIS — Z79899 Other long term (current) drug therapy: Secondary | ICD-10-CM

## 2016-03-09 DIAGNOSIS — F0390 Unspecified dementia without behavioral disturbance: Secondary | ICD-10-CM | POA: Diagnosis present

## 2016-03-09 DIAGNOSIS — B9689 Other specified bacterial agents as the cause of diseases classified elsewhere: Secondary | ICD-10-CM | POA: Diagnosis present

## 2016-03-09 DIAGNOSIS — Z794 Long term (current) use of insulin: Secondary | ICD-10-CM

## 2016-03-09 DIAGNOSIS — Z1619 Resistance to other specified beta lactam antibiotics: Secondary | ICD-10-CM | POA: Diagnosis present

## 2016-03-09 DIAGNOSIS — Z66 Do not resuscitate: Secondary | ICD-10-CM | POA: Diagnosis present

## 2016-03-09 DIAGNOSIS — Z9889 Other specified postprocedural states: Secondary | ICD-10-CM

## 2016-03-09 DIAGNOSIS — Z7189 Other specified counseling: Secondary | ICD-10-CM

## 2016-03-09 DIAGNOSIS — N189 Chronic kidney disease, unspecified: Secondary | ICD-10-CM | POA: Diagnosis present

## 2016-03-09 DIAGNOSIS — Z86711 Personal history of pulmonary embolism: Secondary | ICD-10-CM

## 2016-03-09 DIAGNOSIS — I129 Hypertensive chronic kidney disease with stage 1 through stage 4 chronic kidney disease, or unspecified chronic kidney disease: Secondary | ICD-10-CM | POA: Diagnosis present

## 2016-03-09 DIAGNOSIS — N390 Urinary tract infection, site not specified: Secondary | ICD-10-CM | POA: Diagnosis present

## 2016-03-09 DIAGNOSIS — Z7901 Long term (current) use of anticoagulants: Secondary | ICD-10-CM

## 2016-03-09 LAB — CBC
HEMATOCRIT: 27.1 % — AB (ref 35.0–47.0)
Hemoglobin: 9.5 g/dL — ABNORMAL LOW (ref 12.0–16.0)
MCH: 29.2 pg (ref 26.0–34.0)
MCHC: 34.9 g/dL (ref 32.0–36.0)
MCV: 83.6 fL (ref 80.0–100.0)
Platelets: 293 10*3/uL (ref 150–440)
RBC: 3.25 MIL/uL — ABNORMAL LOW (ref 3.80–5.20)
RDW: 15.2 % — AB (ref 11.5–14.5)
WBC: 5.2 10*3/uL (ref 3.6–11.0)

## 2016-03-09 LAB — BASIC METABOLIC PANEL
Anion gap: 10 (ref 5–15)
BUN: 32 mg/dL — AB (ref 6–20)
CO2: 35 mmol/L — ABNORMAL HIGH (ref 22–32)
Calcium: 8.1 mg/dL — ABNORMAL LOW (ref 8.9–10.3)
Chloride: 89 mmol/L — ABNORMAL LOW (ref 101–111)
Creatinine, Ser: 1.5 mg/dL — ABNORMAL HIGH (ref 0.44–1.00)
GFR calc Af Amer: 35 mL/min — ABNORMAL LOW (ref >60)
GFR, EST NON AFRICAN AMERICAN: 30 mL/min — AB (ref >60)
GLUCOSE: 227 mg/dL — AB (ref 65–99)
Potassium: 3.7 mmol/L (ref 3.5–5.1)
Sodium: 134 mmol/L — ABNORMAL LOW (ref 135–145)

## 2016-03-09 LAB — PROTIME-INR
INR: 1.83
PROTHROMBIN TIME: 21.4 s — AB (ref 11.4–15.2)

## 2016-03-09 LAB — GLUCOSE, CAPILLARY: Glucose-Capillary: 165 mg/dL — ABNORMAL HIGH (ref 65–99)

## 2016-03-09 LAB — LACTIC ACID, PLASMA
LACTIC ACID, VENOUS: 1 mmol/L (ref 0.5–1.9)
Lactic Acid, Venous: 1.1 mmol/L (ref 0.5–1.9)

## 2016-03-09 LAB — HEPATIC FUNCTION PANEL
ALK PHOS: 68 U/L (ref 38–126)
ALT: 10 U/L — ABNORMAL LOW (ref 14–54)
AST: 14 U/L — ABNORMAL LOW (ref 15–41)
Albumin: 2.1 g/dL — ABNORMAL LOW (ref 3.5–5.0)
BILIRUBIN INDIRECT: 0.7 mg/dL (ref 0.3–0.9)
Bilirubin, Direct: 0.2 mg/dL (ref 0.1–0.5)
TOTAL PROTEIN: 5.5 g/dL — AB (ref 6.5–8.1)
Total Bilirubin: 0.9 mg/dL (ref 0.3–1.2)

## 2016-03-09 LAB — TROPONIN I
Troponin I: <0.03 ng/mL (ref <0.03)
Troponin I: <0.03 ng/mL (ref <0.03)

## 2016-03-09 LAB — APTT: aPTT: 45 seconds — ABNORMAL HIGH (ref 24–36)

## 2016-03-09 MED ORDER — INSULIN ASPART 100 UNIT/ML ~~LOC~~ SOLN: 0.0000 [IU] | Freq: Every day | SUBCUTANEOUS | Status: DC

## 2016-03-09 MED ORDER — LISINOPRIL-HYDROCHLOROTHIAZIDE 20-12.5 MG PO TABS: 2.0000 | ORAL_TABLET | Freq: Every day | ORAL | Status: DC

## 2016-03-09 MED ORDER — SODIUM CHLORIDE 0.9 % IV BOLUS (SEPSIS)
500.0000 mL | Freq: Once | INTRAVENOUS | Status: AC
  Administered 2016-03-09: 500 mL via INTRAVENOUS

## 2016-03-09 MED ORDER — ONDANSETRON HCL 4 MG/2ML IJ SOLN: 4.0000 mg | Freq: Four times a day (QID) | INTRAMUSCULAR | Status: DC | PRN

## 2016-03-09 MED ORDER — INSULIN DETEMIR 100 UNIT/ML ~~LOC~~ SOLN
13.0000 [IU] | Freq: Every morning | SUBCUTANEOUS | Status: DC
  Filled 2016-03-09: qty 0.13

## 2016-03-09 MED ORDER — IOPAMIDOL (ISOVUE-370) INJECTION 76%
75.0000 mL | Freq: Once | INTRAVENOUS | Status: AC | PRN
  Administered 2016-03-09: 75 mL via INTRAVENOUS

## 2016-03-09 MED ORDER — ENSURE ENLIVE PO LIQD
237.0000 mL | Freq: Three times a day (TID) | ORAL | Status: DC
  Administered 2016-03-10 – 2016-03-11 (×4): 237 mL via ORAL

## 2016-03-09 MED ORDER — ATORVASTATIN CALCIUM 20 MG PO TABS
20.0000 mg | ORAL_TABLET | Freq: Every day | ORAL | Status: DC
  Administered 2016-03-09 – 2016-03-12 (×4): 20 mg via ORAL
  Filled 2016-03-09 (×4): qty 1

## 2016-03-09 MED ORDER — AMLODIPINE BESYLATE 5 MG PO TABS
5.0000 mg | ORAL_TABLET | Freq: Every day | ORAL | Status: DC
  Administered 2016-03-09 – 2016-03-13 (×5): 5 mg via ORAL
  Filled 2016-03-09 (×5): qty 1

## 2016-03-09 MED ORDER — ACETAMINOPHEN 325 MG PO TABS: 650.0000 mg | ORAL_TABLET | ORAL | Status: DC | PRN

## 2016-03-09 MED ORDER — MORPHINE SULFATE (PF) 4 MG/ML IV SOLN
2.0000 mg | INTRAVENOUS | Status: DC | PRN
Start: 2016-03-09 — End: 2016-03-13
  Administered 2016-03-12: 2 mg via INTRAVENOUS
  Filled 2016-03-09: qty 1

## 2016-03-09 MED ORDER — INSULIN ASPART 100 UNIT/ML ~~LOC~~ SOLN
0.0000 [IU] | Freq: Three times a day (TID) | SUBCUTANEOUS | Status: DC
  Administered 2016-03-10: 3 [IU] via SUBCUTANEOUS
  Administered 2016-03-10 – 2016-03-11 (×2): 1 [IU] via SUBCUTANEOUS
  Administered 2016-03-11: 2 [IU] via SUBCUTANEOUS
  Administered 2016-03-12: 3 [IU] via SUBCUTANEOUS
  Administered 2016-03-12: 1 [IU] via SUBCUTANEOUS
  Administered 2016-03-12 – 2016-03-13 (×3): 2 [IU] via SUBCUTANEOUS
  Filled 2016-03-09: qty 2
  Filled 2016-03-09: qty 3
  Filled 2016-03-09: qty 1
  Filled 2016-03-09 (×2): qty 2
  Filled 2016-03-09: qty 1
  Filled 2016-03-09: qty 3
  Filled 2016-03-09: qty 2

## 2016-03-09 MED ORDER — APIXABAN 5 MG PO TABS
5.0000 mg | ORAL_TABLET | Freq: Two times a day (BID) | ORAL | Status: DC
  Administered 2016-03-09 – 2016-03-13 (×8): 5 mg via ORAL
  Filled 2016-03-09 (×8): qty 1

## 2016-03-09 MED ORDER — LISINOPRIL 20 MG PO TABS
40.0000 mg | ORAL_TABLET | Freq: Every day | ORAL | Status: DC
  Administered 2016-03-10: 40 mg via ORAL
  Filled 2016-03-09: qty 2

## 2016-03-09 MED ORDER — GLIPIZIDE ER 10 MG PO TB24: 10.0000 mg | ORAL_TABLET | Freq: Every day | ORAL | Status: DC

## 2016-03-09 MED ORDER — GABAPENTIN 300 MG PO CAPS
300.0000 mg | ORAL_CAPSULE | Freq: Every day | ORAL | Status: DC
  Administered 2016-03-09 – 2016-03-12 (×4): 300 mg via ORAL
  Filled 2016-03-09 (×4): qty 1

## 2016-03-09 MED ORDER — MELOXICAM 7.5 MG PO TABS
7.5000 mg | ORAL_TABLET | Freq: Every day | ORAL | Status: DC
  Administered 2016-03-09 – 2016-03-10 (×2): 7.5 mg via ORAL
  Filled 2016-03-09 (×2): qty 1

## 2016-03-09 MED ORDER — ONDANSETRON HCL 4 MG PO TABS
4.0000 mg | ORAL_TABLET | Freq: Two times a day (BID) | ORAL | Status: DC
  Administered 2016-03-09 – 2016-03-13 (×7): 4 mg via ORAL
  Filled 2016-03-09 (×7): qty 1

## 2016-03-09 MED ORDER — HYDROCHLOROTHIAZIDE 25 MG PO TABS
25.0000 mg | ORAL_TABLET | Freq: Every day | ORAL | Status: DC
  Administered 2016-03-10: 25 mg via ORAL
  Filled 2016-03-09: qty 1

## 2016-03-09 NOTE — ED Provider Notes (Signed)
[facility] Emergency Department Provider Note   ____________________________________________   First MD Initiated Contact with Patient 03/09/16 1333     (approximate)  I have reviewed the triage vital signs and the nursing notes.   HISTORY  Chief Complaint Chest Pain   HPI Sherry [name] is a 83 y.o. female history of pulmonary embolism, recent admission for sepsis. ON eliquis.  Patient reports a couple hours ago she began a strange feeling of chest pressure across the anterior chest  She describes it as a heavy pressure. She was given aspirin by EMS, upon arrival here she reports the pain has subsided but still has a minimal discomfort in the left chest described as a pressure. No nausea vomiting. She experienced some shortness of breath, but this is been relieved now. She is also on treatment for blood clots and had a recent infection. She was last seen for feeling generally weak and tired a couple days ago in the ER  Notable risk for exclude diabetes hyperlipidemia hypertension. Also recent pulmonary embolisms     Past Medical History:  Diagnosis Date  . Diabetes mellitus without complication ([place])   . Hyperlipidemia   . Hypertension     Patient Active Problem List   Diagnosis Date Noted  . PE (pulmonary thromboembolism) ([place]) 02/26/2016  . Diabetes mellitus, type 2 ([place]) 02/26/2016  . Anemia 02/26/2016  . Chronic kidney disease (CKD), stage III (moderate) 02/26/2016  . Essential hypertension 02/26/2016  . Goals of care, counseling/discussion   . Adult failure to thrive syndrome   . DNR (do not resuscitate)   . Palliative care by specialist   . Sepsis ([place]) 11/26/2014    Past Surgical History:  Procedure Laterality Date  . BACK SURGERY    . SHOULDER SURGERY      Prior to Admission medications   Medication Sig Start Date End Date Taking? Authorizing Provider  amLODipine (NORVASC) 5 MG tablet Take 5 mg by mouth daily.   Yes Historical  Provider, MD  apixaban (ELIQUIS) 5 MG TABS tablet Take 1 tablet (5 mg total) by mouth 2 (two) times daily. 03/08/16  Yes Erline Hau, MD  atorvastatin (LIPITOR) 20 MG tablet Take 1 tablet by mouth at bedtime.   Yes Historical Provider, MD  gabapentin (NEURONTIN) 300 MG capsule Take 1 capsule by mouth at bedtime.   Yes Historical Provider, MD  glipiZIDE (GLUCOTROL XL) 10 MG 24 hr tablet Take 1 tablet by mouth daily.   Yes Historical Provider, MD  Insulin Detemir (LEVEMIR) 100 UNIT/ML Pen Inject 13 Units into the skin every morning.   Yes Historical Provider, MD  lisinopril-hydrochlorothiazide (PRINZIDE,ZESTORETIC) 20-12.5 MG tablet Take 2 tablets by mouth daily.    Yes Historical Provider, MD  meloxicam (MOBIC) 7.5 MG tablet Take 7.5 mg by mouth daily.   Yes Historical Provider, MD  metFORMIN (GLUCOPHAGE) 1000 MG tablet Take 1,000 mg by mouth 2 (two) times daily with a meal.   Yes Historical Provider, MD  acetaminophen (TYLENOL) 325 MG tablet Take 650 mg by mouth every 4 (four) hours as needed for mild pain or moderate pain.    Historical Provider, MD  apixaban (ELIQUIS) 5 MG TABS tablet Take 2 tablets (10 mg total) by mouth 2 (two) times daily. Patient not taking: Reported on 03/09/2016 03/01/16   Erline Hau, MD  feeding supplement, ENSURE ENLIVE, (ENSURE ENLIVE) LIQD Take 237 mLs by mouth 3 (three) times daily with meals. 02/07/16   Demetrios Loll,  MD  ondansetron (ZOFRAN) 4 MG tablet Take 4 mg by mouth 2 (two) times daily. *May take every 8 hours as needed for nausea and vomiting    Historical Provider, MD  potassium chloride SA (K-DUR,KLOR-CON) 20 MEQ tablet Take 1 tablet (20 mEq total) by mouth 2 (two) times daily. For 4 days Patient not taking: Reported on 03/09/2016 03/01/16   Erline Hau, MD    Allergies Patient has no known allergies.  No family history on file.  Social History Social History  Substance Use Topics  . Smoking status: Never Smoker  .  Smokeless tobacco: Never Used  . Alcohol use No    Review of Systems Constitutional: No fever/chills Eyes: No visual changes. ENT: No sore throat. Cardiovascular: See history of present illness Respiratory: Denies shortness of breath. Gastrointestinal: No abdominal pain.  No nausea, no vomiting.  No diarrhea.  No constipation. Genitourinary: Negative for dysuria. Musculoskeletal: Negative for back pain. Skin: Negative for rash. Neurological: Negative for headaches, focal weakness or numbness.  10-point ROS otherwise negative.  ____________________________________________   PHYSICAL EXAM:  VITAL SIGNS: ED Triage Vitals [03/09/16 1321]  Enc Vitals Group     BP (!) 119/54     Pulse Rate 87     Resp 20     Temp 98.1 F (36.7 C)     Temp Source Oral     SpO2 100 %     Weight      Height      Head Circumference      Peak Flow      Pain Score 6     Pain Loc      Pain Edu?      Excl. in [place]?     Constitutional: Alert and oriented. Well appearing and in no acute distressSomewhat chronically ill in appearance. Eyes: Conjunctivae are normal. PERRL. EOMI. Head: Atraumatic. Nose: No congestion/rhinnorhea. Mouth/Throat: Mucous membranes are moist.  Oropharynx non-erythematous. Neck: No stridor.   Cardiovascular: Normal rate, regular rhythm. Grossly normal heart sounds.  Good peripheral circulation. Respiratory: Normal respiratory effort.  No retractions. No focal rales, there is decreased lung sounds over the right mid chest. The left lung appears clear. Denies pleuritic pain. Gastrointestinal: Soft and no distention. She does report mild to moderate tenderness over the left mid left upper abdomen. No right-sided tenderness. Musculoskeletal: No lower extremity tenderness nor edema.  No joint effusions. Neurologic:  Normal speech and language. No gross focal neurologic deficits are appreciated.  Skin:  Skin is warm, dry and intact. No rash noted. Psychiatric: Mood and affect  are normal. Speech and behavior are normal.  ____________________________________________   LABS (all labs ordered are listed, but only abnormal results are displayed)  Labs Reviewed  BASIC METABOLIC PANEL - Abnormal; Notable for the following:       Result Value   Sodium 134 (*)    Chloride 89 (*)    CO2 35 (*)    Glucose, Bld 227 (*)    BUN 32 (*)    Creatinine, Ser 1.50 (*)    Calcium 8.1 (*)    GFR calc non Af Amer 30 (*)    GFR calc Af Amer 35 (*)    All other components within normal limits  CBC - Abnormal; Notable for the following:    RBC 3.25 (*)    Hemoglobin 9.5 (*)    HCT 27.1 (*)    RDW 15.2 (*)    All other components within normal limits  HEPATIC FUNCTION PANEL - Abnormal; Notable for the following:    Total Protein 5.5 (*)    Albumin 2.1 (*)    AST 14 (*)    ALT 10 (*)    All other components within normal limits  PROTIME-INR - Abnormal; Notable for the following:    Prothrombin Time 21.4 (*)    All other components within normal limits  APTT - Abnormal; Notable for the following:    aPTT 45 (*)    All other components within normal limits  CULTURE, BLOOD (ROUTINE X 2)  CULTURE, BLOOD (ROUTINE X 2)  TROPONIN I  LACTIC ACID, PLASMA  LACTIC ACID, PLASMA   ____________________________________________  EKG  Reviewed and interpreted by me at 1315 Heart rate 90 Curtis 140 QTc 480 sinus rhythm with associated left bundle branch block.  Compared with previous EKG, left bundle branch block is pre-existing ____________________________________________  RADIOLOGY  Ct Angio Chest Pe W Or Wo Contrast  Result Date: 03/09/2016 CLINICAL DATA:  Left-sided chest pain. Pain to palpation along the left abdomen. EXAM: CT ANGIOGRAPHY CHEST CT ABDOMEN AND PELVIS WITH CONTRAST TECHNIQUE: Multidetector CT imaging of the chest was performed using the standard protocol during bolus administration of intravenous contrast. Multiplanar CT image reconstructions and MIPs  were obtained to evaluate the vascular anatomy. Multidetector CT imaging of the abdomen and pelvis was performed using the standard protocol during bolus administration of intravenous contrast. CONTRAST:  75 cc Isovue 370 intravenous COMPARISON:  02/26/2016 FINDINGS: CTA CHEST FINDINGS Cardiovascular: Borderline cardiomegaly. Question small volume pericardial fluid posteriorly. Satisfactory opacification of the pulmonary arteries. Significant decrease in pulmonary artery clot that was seen previously. No web or new embolus identified. Normal right to left ventricle ratio. Atherosclerosis, including the coronary arteries. No acute aortic finding. Mediastinum/Nodes: Sliding hiatal hernia with prominent thickening an mucosal hyperenhancement of the lower esophagus. Superiorly the esophagus is patulous. Lungs/Pleura: Moderate to large right and small left pleural effusions with multi segment atelectasis. No edema or consolidation. Musculoskeletal: No acute or aggressive finding Review of the MIP images confirms the above findings. CT ABDOMEN and PELVIS FINDINGS Hepatobiliary: No focal liver abnormality.No evidence of biliary obstruction or stone. Pancreas: Atrophic appearance of the pancreas tail without evidence of mass or ductal dilatation. Spleen: Unremarkable. Adrenals/Urinary Tract: Negative adrenals. Mass within the collecting system of of the upper and interpolar right kidney with speckled calcifications. The mass is somewhat low-density, but still primarily concerning for urothelial carcinoma. Given low-density debris/chronic hemorrhage is also considered. No related obstruction. No high-density clot. Mild hydronephrosis on the left. The urinary bladder is markedly distended. Stomach/Bowel: No obstruction. No inflammatory changes. Hiatal hernia described above. Vascular/Lymphatic: No acute vascular abnormality. No mass or adenopathy. Reproductive:Negative Other: No ascites or pneumoperitoneum. Musculoskeletal:  Remote L3 compression fracture. Lower lumbar degenerative disc narrowing. Review of the MIP images confirms the above findings. IMPRESSION: 1. Excellent clearing of previously seen pulmonary emboli. No interval embolus identified. 2. Large right and small left pleural effusions are stable from 02/26/2016. Multi segment atelectasis. 3. Intrarenal and renal pelvis collecting system mass on the right. Recommend workup for urothelial carcinoma. 4. Lower esophageal thickening and mucosal hyper enhancement, likely reflux esophagitis in the setting of hiatal hernia. Follow-up is recommended. 5. Over distended urinary bladder. Electronically Signed   By: Monte Fantasia M.D.   On: 03/09/2016 15:04   Ct Abdomen Pelvis W Contrast  Result Date: 03/09/2016 CLINICAL DATA:  Left-sided chest pain. Pain to palpation along the left abdomen. EXAM: CT ANGIOGRAPHY CHEST  CT ABDOMEN AND PELVIS WITH CONTRAST TECHNIQUE: Multidetector CT imaging of the chest was performed using the standard protocol during bolus administration of intravenous contrast. Multiplanar CT image reconstructions and MIPs were obtained to evaluate the vascular anatomy. Multidetector CT imaging of the abdomen and pelvis was performed using the standard protocol during bolus administration of intravenous contrast. CONTRAST:  75 cc Isovue 370 intravenous COMPARISON:  02/26/2016 FINDINGS: CTA CHEST FINDINGS Cardiovascular: Borderline cardiomegaly. Question small volume pericardial fluid posteriorly. Satisfactory opacification of the pulmonary arteries. Significant decrease in pulmonary artery clot that was seen previously. No web or new embolus identified. Normal right to left ventricle ratio. Atherosclerosis, including the coronary arteries. No acute aortic finding. Mediastinum/Nodes: Sliding hiatal hernia with prominent thickening an mucosal hyperenhancement of the lower esophagus. Superiorly the esophagus is patulous. Lungs/Pleura: Moderate to large right and  small left pleural effusions with multi segment atelectasis. No edema or consolidation. Musculoskeletal: No acute or aggressive finding Review of the MIP images confirms the above findings. CT ABDOMEN and PELVIS FINDINGS Hepatobiliary: No focal liver abnormality.No evidence of biliary obstruction or stone. Pancreas: Atrophic appearance of the pancreas tail without evidence of mass or ductal dilatation. Spleen: Unremarkable. Adrenals/Urinary Tract: Negative adrenals. Mass within the collecting system of of the upper and interpolar right kidney with speckled calcifications. The mass is somewhat low-density, but still primarily concerning for urothelial carcinoma. Given low-density debris/chronic hemorrhage is also considered. No related obstruction. No high-density clot. Mild hydronephrosis on the left. The urinary bladder is markedly distended. Stomach/Bowel: No obstruction. No inflammatory changes. Hiatal hernia described above. Vascular/Lymphatic: No acute vascular abnormality. No mass or adenopathy. Reproductive:Negative Other: No ascites or pneumoperitoneum. Musculoskeletal: Remote L3 compression fracture. Lower lumbar degenerative disc narrowing. Review of the MIP images confirms the above findings. IMPRESSION: 1. Excellent clearing of previously seen pulmonary emboli. No interval embolus identified. 2. Large right and small left pleural effusions are stable from 02/26/2016. Multi segment atelectasis. 3. Intrarenal and renal pelvis collecting system mass on the right. Recommend workup for urothelial carcinoma. 4. Lower esophageal thickening and mucosal hyper enhancement, likely reflux esophagitis in the setting of hiatal hernia. Follow-up is recommended. 5. Over distended urinary bladder. Electronically Signed   By: Monte Fantasia M.D.   On: 03/09/2016 15:04   Dg Chest Portable 1 View  Result Date: 03/09/2016 CLINICAL DATA:  Shortness of breath and chest pain EXAM: PORTABLE CHEST 1 VIEW COMPARISON:  March 07, 2016 FINDINGS: There is partially loculated pleural effusion on the right. There is patchy atelectasis in the right mid lung and right base regions. There is no demonstrable airspace consolidation currently in this area. On the left, there is slight left base atelectasis. Left lung otherwise clear. Heart is mildly enlarged with pulmonary vascularity within normal limits. There is atherosclerotic calcification in the aorta. There is no evident adenopathy. There is atherosclerotic calcification in both carotid arteries. There is evidence of old trauma involving the acromioclavicular joint with separation in this area, chronic. Bones are osteoporotic. Postoperative changes noted in the lower cervical spine. IMPRESSION: Partially loculated right pleural effusion with patchy atelectasis in the right mid and lower lung zones. No frank consolidation currently in the right base. Mild left base atelectasis. Stable cardiomegaly with pulmonary vascularity within normal limits. Bones osteoporotic. Chronic acromioclavicular separation on the right. Extensive carotid artery calcification bilaterally noted. There is also aortic atherosclerosis. Electronically Signed   By: Lowella Grip III M.D.   On: 03/09/2016 14:18   No obvious acute findings to account for  the patient's pain noted on CTs ____________________________________________   PROCEDURES  Procedure(s) performed: None  Procedures  Critical Care performed: No  ____________________________________________   INITIAL IMPRESSION / ASSESSMENT AND PLAN / ED COURSE  Pertinent labs & imaging results that were available during my care of the patient were reviewed by me and considered in my medical decision making (see chart for details).  Patient was evaluated left-sided chest pressure. She has had recent hospitalizations, and has multiple risk factors and considerations for chest pain. Given recent pulmonary embolisms, reported chest pain or shortness  of breath we'll repeat CT scan to further evaluate for possibility of recurrent pulmonary embolisms, possible pneumonias, or other acute chest pathology or even the potential for referred pain from the abdomen given tenderness on exam.  Her EKG shows a long-standing left bundle branch block, her first troponin is negative making acute coronary syndrome less likely, however given her age and risk factors and description of left-sided chest pressure she is at elevated risk at least moderate for acute coronary syndrome.  ----------------------------------------- 3:39 PM on 03/09/2016 -----------------------------------------  Labs, CTs reviewed. Patient remains comfortable, but given her recent clinical course and moderate risk for coronary disease will admit the patient for observation and further workup. Patient agreeable      ____________________________________________   FINAL CLINICAL IMPRESSION(S) / ED DIAGNOSES  Final diagnoses:  Chest pain  Chest pain, moderate coronary artery risk      NEW MEDICATIONS STARTED DURING THIS VISIT:  New Prescriptions   No medications on file     Note:  This document was prepared using Dragon voice recognition software and may include unintentional dictation errors.     Delman Kitten, MD 03/09/16 1539

## 2016-03-09 NOTE — ED Triage Notes (Addendum)
Pt to ED via EMS from home with c/o CP that started around 0700 today, pt describes pain to left side radiating to back. Per EMS pt was discharged from hospital on 2/24 with blood clots. Pt on 3L contin o2 at home. EMS gave 324 Asprin and 2nitro en route, pt states pain was relieved. Pt A&Ox3, hx of a-fib, VS stable, appears fatigued.

## 2016-03-09 NOTE — Plan of Care (Signed)
Problem: Safety: Goal: Ability to remain free from injury will improve Outcome: Progressing Fall precautions in place  Problem: Pain Managment: Goal: General experience of comfort will improve Outcome: Progressing Prn medications  Problem: Skin Integrity: Goal: Risk for impaired skin integrity will decrease Outcome: Not Progressing Small blister to buttock, foam applied  Problem: Tissue Perfusion: Goal: Risk factors for ineffective tissue perfusion will decrease Outcome: Progressing PO Eliquis  Problem: Cardiac: Goal: Ability to achieve and maintain adequate cardiovascular perfusion will improve Outcome: Progressing Cycling troponins

## 2016-03-09 NOTE — H&P (Signed)
[facility] [place] NAME: Sherry [name]    MR#:  [id number]  DATE OF BIRTH:  [birth year]  DATE OF ADMISSION:  03/09/2016  PRIMARY CARE PHYSICIAN: [place]   REQUESTING/REFERRING PHYSICIAN:   CHIEF COMPLAINT:   Chest pain HISTORY OF PRESENT ILLNESS:  Sherry [name]  is a 83 y.o. female with a known history of Diabetes mellitus, hypertension, hyper lipidemia recent history of pulmonary embolism diagnosed on 02/26/2016 at [facility] currently on eliquis is coming to the ED via EMS with a chief complaint of chest pain. EKG with a left bundle branch block which is old when compared to the previous records. Initial troponin is negative. Patient is a poor historian from chronic dementia. CT angiogram of the chest has revealed excellent clearing of the previously seen pulmonary embolism. Large right and small left-sided pleural effusion  PAST MEDICAL HISTORY:   Past Medical History:  Diagnosis Date  . Diabetes mellitus without complication ([place])   . Hyperlipidemia   . Hypertension     PAST SURGICAL HISTOIRY:   Past Surgical History:  Procedure Laterality Date  . BACK SURGERY    . SHOULDER SURGERY      SOCIAL HISTORY:   Social History  Substance Use Topics  . Smoking status: Never Smoker  . Smokeless tobacco: Never Used  . Alcohol use No    FAMILY HISTORY:  No family history on file.  DRUG ALLERGIES:  No Known Allergies  REVIEW OF SYSTEMS:  Review of systems unobtainable as the patient is chronically demented  MEDICATIONS AT HOME:   Prior to Admission medications   Medication Sig Start Date End Date Taking? Authorizing Provider  amLODipine (NORVASC) 5 MG tablet Take 5 mg by mouth daily.   Yes Historical Provider, MD  apixaban (ELIQUIS) 5 MG TABS tablet Take 1 tablet (5 mg total) by mouth 2 (two) times daily. 03/08/16  Yes Erline Hau, MD  atorvastatin (LIPITOR) 20 MG tablet Take 1  tablet by mouth at bedtime.   Yes Historical Provider, MD  gabapentin (NEURONTIN) 300 MG capsule Take 1 capsule by mouth at bedtime.   Yes Historical Provider, MD  glipiZIDE (GLUCOTROL XL) 10 MG 24 hr tablet Take 1 tablet by mouth daily.   Yes Historical Provider, MD  Insulin Detemir (LEVEMIR) 100 UNIT/ML Pen Inject 13 Units into the skin every morning.   Yes Historical Provider, MD  lisinopril-hydrochlorothiazide (PRINZIDE,ZESTORETIC) 20-12.5 MG tablet Take 2 tablets by mouth daily.    Yes Historical Provider, MD  meloxicam (MOBIC) 7.5 MG tablet Take 7.5 mg by mouth daily.   Yes Historical Provider, MD  metFORMIN (GLUCOPHAGE) 1000 MG tablet Take 1,000 mg by mouth 2 (two) times daily with a meal.   Yes Historical Provider, MD  acetaminophen (TYLENOL) 325 MG tablet Take 650 mg by mouth every 4 (four) hours as needed for mild pain or moderate pain.    Historical Provider, MD  apixaban (ELIQUIS) 5 MG TABS tablet Take 2 tablets (10 mg total) by mouth 2 (two) times daily. Patient not taking: Reported on 03/09/2016 03/01/16   Erline Hau, MD  feeding supplement, ENSURE ENLIVE, (ENSURE ENLIVE) LIQD Take 237 mLs by mouth 3 (three) times daily with meals. 02/07/16   Demetrios Loll, MD  ondansetron (ZOFRAN) 4 MG tablet Take 4 mg by mouth 2 (two) times daily. *May take every 8 hours as needed for nausea and vomiting    Historical Provider, MD  potassium chloride SA (K-DUR,KLOR-CON) 20 MEQ tablet Take 1 tablet (20 mEq total) by mouth 2 (two) times daily. For 4 days Patient not taking: Reported on 03/09/2016 03/01/16   Erline Hau, MD      VITAL SIGNS:  Blood pressure (!) 140/47, pulse 82, temperature 98.1 F (36.7 C), temperature source Oral, resp. rate (!) 21, SpO2 97 %.  PHYSICAL EXAMINATION:  GENERAL:  83 y.o.-year-old patient lying in the bed with no acute distress.  EYES: Pupils equal, round, reactive to light and accommodation. No scleral icterus. Extraocular muscles intact.  HEENT:  Head atraumatic, normocephalic. Oropharynx and nasopharynx clear.  NECK:  Supple, no jugular venous distention. No thyroid enlargement, no tenderness.  LUNGS: Normal breath sounds bilaterally, no wheezing, rales,rhonchi or crepitation. No use of accessory muscles of respiration.  CARDIOVASCULAR: S1, S2 normal. No murmurs, rubs, or gallops.  ABDOMEN: Soft, nontender, nondistended. Bowel sounds present. No organomegaly or mass.  EXTREMITIES: No pedal edema, cyanosis, or clubbing.  NEUROLOGIC: Patient is awake and alert and chronically demented.  PSYCHIATRIC: The patient is alert and demented  SKIN: No obvious rash, lesion, or ulcer.   LABORATORY PANEL:   CBC  Recent Labs Lab 03/09/16 1324  WBC 5.2  HGB 9.5*  HCT 27.1*  PLT 293   ------------------------------------------------------------------------------------------------------------------  Chemistries   Recent Labs Lab 03/09/16 1324  NA 134*  K 3.7  CL 89*  CO2 35*  GLUCOSE 227*  BUN 32*  CREATININE 1.50*  CALCIUM 8.1*  AST 14*  ALT 10*  ALKPHOS 68  BILITOT 0.9   ------------------------------------------------------------------------------------------------------------------  Cardiac Enzymes  Recent Labs Lab 03/09/16 1324  TROPONINI <0.03   ------------------------------------------------------------------------------------------------------------------  RADIOLOGY:  Dg Chest 2 View  Result Date: 03/07/2016 CLINICAL DATA:  Hyperglycemia, dyspnea and weakness. Diabetes and hypertension. History of recent pulmonary embolus. EXAM: CHEST  2 VIEW COMPARISON:  February 26, 2016 chest CT and CXR FINDINGS: Low lung volumes are noted on current exam. Stable cardiomegaly with aortic atherosclerosis. Moderate right effusion with adjacent compressive atelectasis without significant change allowing for low lung volumes. It appears slightly greater on the frontal projection does smaller on the lateral since previous. Small  left pleural effusion. No overt pulmonary edema or pneumothorax. ACDF of the cervical spine. Osteoarthritis of both glenohumeral and AC joints. Chronic stable degenerate change of the dorsal spine. IMPRESSION: 1. Low lung volumes on current exam with chronic moderate right and small left pleural effusions. Likely adjacent to compressive atelectasis due to the pleural effusions bilaterally. 2. Aortic atherosclerosis. Electronically Signed   By: Ashley Royalty M.D.   On: 03/07/2016 17:35   Ct Angio Chest Pe W Or Wo Contrast  Result Date: 03/09/2016 CLINICAL DATA:  Left-sided chest pain. Pain to palpation along the left abdomen. EXAM: CT ANGIOGRAPHY CHEST CT ABDOMEN AND PELVIS WITH CONTRAST TECHNIQUE: Multidetector CT imaging of the chest was performed using the standard protocol during bolus administration of intravenous contrast. Multiplanar CT image reconstructions and MIPs were obtained to evaluate the vascular anatomy. Multidetector CT imaging of the abdomen and pelvis was performed using the standard protocol during bolus administration of intravenous contrast. CONTRAST:  75 cc Isovue 370 intravenous COMPARISON:  02/26/2016 FINDINGS: CTA CHEST FINDINGS Cardiovascular: Borderline cardiomegaly. Question small volume pericardial fluid posteriorly. Satisfactory opacification of the pulmonary arteries. Significant decrease in pulmonary artery clot that was seen previously. No web or new embolus identified. Normal right to left ventricle ratio. Atherosclerosis, including the coronary arteries. No acute aortic finding. Mediastinum/Nodes: Sliding hiatal hernia with  prominent thickening an mucosal hyperenhancement of the lower esophagus. Superiorly the esophagus is patulous. Lungs/Pleura: Moderate to large right and small left pleural effusions with multi segment atelectasis. No edema or consolidation. Musculoskeletal: No acute or aggressive finding Review of the MIP images confirms the above findings. CT ABDOMEN and  PELVIS FINDINGS Hepatobiliary: No focal liver abnormality.No evidence of biliary obstruction or stone. Pancreas: Atrophic appearance of the pancreas tail without evidence of mass or ductal dilatation. Spleen: Unremarkable. Adrenals/Urinary Tract: Negative adrenals. Mass within the collecting system of of the upper and interpolar right kidney with speckled calcifications. The mass is somewhat low-density, but still primarily concerning for urothelial carcinoma. Given low-density debris/chronic hemorrhage is also considered. No related obstruction. No high-density clot. Mild hydronephrosis on the left. The urinary bladder is markedly distended. Stomach/Bowel: No obstruction. No inflammatory changes. Hiatal hernia described above. Vascular/Lymphatic: No acute vascular abnormality. No mass or adenopathy. Reproductive:Negative Other: No ascites or pneumoperitoneum. Musculoskeletal: Remote L3 compression fracture. Lower lumbar degenerative disc narrowing. Review of the MIP images confirms the above findings. IMPRESSION: 1. Excellent clearing of previously seen pulmonary emboli. No interval embolus identified. 2. Large right and small left pleural effusions are stable from 02/26/2016. Multi segment atelectasis. 3. Intrarenal and renal pelvis collecting system mass on the right. Recommend workup for urothelial carcinoma. 4. Lower esophageal thickening and mucosal hyper enhancement, likely reflux esophagitis in the setting of hiatal hernia. Follow-up is recommended. 5. Over distended urinary bladder. Electronically Signed   By: Monte Fantasia M.D.   On: 03/09/2016 15:04   Ct Abdomen Pelvis W Contrast  Result Date: 03/09/2016 CLINICAL DATA:  Left-sided chest pain. Pain to palpation along the left abdomen. EXAM: CT ANGIOGRAPHY CHEST CT ABDOMEN AND PELVIS WITH CONTRAST TECHNIQUE: Multidetector CT imaging of the chest was performed using the standard protocol during bolus administration of intravenous contrast. Multiplanar  CT image reconstructions and MIPs were obtained to evaluate the vascular anatomy. Multidetector CT imaging of the abdomen and pelvis was performed using the standard protocol during bolus administration of intravenous contrast. CONTRAST:  75 cc Isovue 370 intravenous COMPARISON:  02/26/2016 FINDINGS: CTA CHEST FINDINGS Cardiovascular: Borderline cardiomegaly. Question small volume pericardial fluid posteriorly. Satisfactory opacification of the pulmonary arteries. Significant decrease in pulmonary artery clot that was seen previously. No web or new embolus identified. Normal right to left ventricle ratio. Atherosclerosis, including the coronary arteries. No acute aortic finding. Mediastinum/Nodes: Sliding hiatal hernia with prominent thickening an mucosal hyperenhancement of the lower esophagus. Superiorly the esophagus is patulous. Lungs/Pleura: Moderate to large right and small left pleural effusions with multi segment atelectasis. No edema or consolidation. Musculoskeletal: No acute or aggressive finding Review of the MIP images confirms the above findings. CT ABDOMEN and PELVIS FINDINGS Hepatobiliary: No focal liver abnormality.No evidence of biliary obstruction or stone. Pancreas: Atrophic appearance of the pancreas tail without evidence of mass or ductal dilatation. Spleen: Unremarkable. Adrenals/Urinary Tract: Negative adrenals. Mass within the collecting system of of the upper and interpolar right kidney with speckled calcifications. The mass is somewhat low-density, but still primarily concerning for urothelial carcinoma. Given low-density debris/chronic hemorrhage is also considered. No related obstruction. No high-density clot. Mild hydronephrosis on the left. The urinary bladder is markedly distended. Stomach/Bowel: No obstruction. No inflammatory changes. Hiatal hernia described above. Vascular/Lymphatic: No acute vascular abnormality. No mass or adenopathy. Reproductive:Negative Other: No ascites or  pneumoperitoneum. Musculoskeletal: Remote L3 compression fracture. Lower lumbar degenerative disc narrowing. Review of the MIP images confirms the above findings. IMPRESSION: 1. Excellent clearing of  previously seen pulmonary emboli. No interval embolus identified. 2. Large right and small left pleural effusions are stable from 02/26/2016. Multi segment atelectasis. 3. Intrarenal and renal pelvis collecting system mass on the right. Recommend workup for urothelial carcinoma. 4. Lower esophageal thickening and mucosal hyper enhancement, likely reflux esophagitis in the setting of hiatal hernia. Follow-up is recommended. 5. Over distended urinary bladder. Electronically Signed   By: Monte Fantasia M.D.   On: 03/09/2016 15:04   Dg Chest Portable 1 View  Result Date: 03/09/2016 CLINICAL DATA:  Shortness of breath and chest pain EXAM: PORTABLE CHEST 1 VIEW COMPARISON:  March 07, 2016 FINDINGS: There is partially loculated pleural effusion on the right. There is patchy atelectasis in the right mid lung and right base regions. There is no demonstrable airspace consolidation currently in this area. On the left, there is slight left base atelectasis. Left lung otherwise clear. Heart is mildly enlarged with pulmonary vascularity within normal limits. There is atherosclerotic calcification in the aorta. There is no evident adenopathy. There is atherosclerotic calcification in both carotid arteries. There is evidence of old trauma involving the acromioclavicular joint with separation in this area, chronic. Bones are osteoporotic. Postoperative changes noted in the lower cervical spine. IMPRESSION: Partially loculated right pleural effusion with patchy atelectasis in the right mid and lower lung zones. No frank consolidation currently in the right base. Mild left base atelectasis. Stable cardiomegaly with pulmonary vascularity within normal limits. Bones osteoporotic. Chronic acromioclavicular separation on the right.  Extensive carotid artery calcification bilaterally noted. There is also aortic atherosclerosis. Electronically Signed   By: Lowella Grip III M.D.   On: 03/09/2016 14:18    EKG:   Orders placed or performed during the hospital encounter of 03/09/16  . EKG 12-Lead  . EKG 12-Lead  . ED EKG within 10 minutes  . ED EKG within 10 minutes    IMPRESSION AND PLAN:  Sherry [name]  is a 83 y.o. female with a known history of Diabetes mellitus, hypertension, hyper lipidemia recent history of pulmonary embolism diagnosed on 02/26/2016 at [facility], currently on eliquis is coming to the ED via EMS with a chief complaint of chest pain. EKG with a left bundle branch block which is old when compared to the previous records. Initial troponin is negative. Patient is a poor historian from chronic dementia.    #Chest pain could be from underlying large pleural effusion on the right /pulmonary embolism Admit patient to telemetry Cycle cardiac biomarkers. Initial troponin is negative. EKG-left bundle branch block which is pre-existing  #Large right-sided pleural effusion etiology unclear Keep her nothing by mouth after midnight Rt thoracentesis requested for tomorrow  # H/o PE Pt is on Eliquis    # DM  Holding metformin as she just had CT angiogram with contrast Continue glipizide Sliding scale insulin  #Chronic kidney disease Creatinine at 1.5 seems to be at her baseline Continue close monitoring and avoid nephrotoxins  #Intrarenal and right pelvic mass concerning for malignancy on CT Patient is DO NOT RESUSCITATE. Need to discuss with the family regarding further workup  DVT prophylaxis on eliquis  All the records are reviewed and case discussed with ED provider. Management plans discussed with the patient, family and they are in agreement.  CODE STATUS: DNR, daughter is agreeable hcpoa -[name] King-contact phone number [phone]  TOTAL TIME TAKING CARE OF THIS PATIENT:   minutes.   Note: This dictation was prepared with Dragon dictation along with smaller phrase technology. Any transcriptional  errors that result from this process are unintentional.  Nicholes Mango M.D on 03/09/2016 at 4:18 PM  Between 7am to 6pm - Pager - [phone]  After 6pm go to [email or web address] - password [email or web address] [facility]  Office  [phone]  CC: Primary care physician; [facility]

## 2016-03-09 NOTE — Progress Notes (Signed)
83yo wf admitted from ED with chest pain.  A&O to person place, situation.  No distress on 2LO2 per [place].  Cardiac monitor placed on pt and verified with Gerald Stabs, CNA, pt denies chest pain.  Lungs diminished lower lobes bil.  2+ edema lower extremeites bil.  SL rt ac flushes well.  Skin checked with Lucina Mellow.  Small abrasion to lt buttock, small blister noted to inner rt buttock foam applied.  Scattered bruises noted to bil arms.  Home medications sent to pharmacy.  Denies need at this time. CB in reach, SR up x 2.

## 2016-03-09 NOTE — ED Notes (Signed)
Patient transported to CT 

## 2016-03-09 NOTE — ED Notes (Signed)
ED Provider at bedside. 

## 2016-03-09 NOTE — Care Management (Addendum)
Received call from from patient's daughter [name] daughter [phone] that patient was coming from [facility] to [facility]. [name] states that she was here about a month ago and sent to [facility]. She states that [facility] kept her confined to wheelchair and "now she has bed sores and blood clots". She states she took her out of [facility] due to the "poor service". She states that the oxygen tank she was on at [facility] was empty. She states [facility] is now following her. She says she "has to work and this is killing me". She has wheelchair and hospital bed. Looks like patient was arranged with home O2 through [facility]. She has not applied patient for Medicaid. Daughter is requesting SNF at [facility].Message left for ED CSW with this request.

## 2016-03-10 ENCOUNTER — Observation Stay: Payer: Medicare Other

## 2016-03-10 LAB — URINALYSIS, ROUTINE W REFLEX MICROSCOPIC
BILIRUBIN URINE: NEGATIVE
Glucose, UA: NEGATIVE mg/dL
Ketones, ur: NEGATIVE mg/dL
Nitrite: NEGATIVE
PH: 8 (ref 5.0–8.0)
Protein, ur: 100 mg/dL — AB
SPECIFIC GRAVITY, URINE: 1.011 (ref 1.005–1.030)
SQUAMOUS EPITHELIAL / LPF: NONE SEEN

## 2016-03-10 LAB — GLUCOSE, CAPILLARY
GLUCOSE-CAPILLARY: 131 mg/dL — AB (ref 65–99)
GLUCOSE-CAPILLARY: 118 mg/dL — AB (ref 65–99)
GLUCOSE-CAPILLARY: 63 mg/dL — AB (ref 65–99)
GLUCOSE-CAPILLARY: 86 mg/dL (ref 65–99)
Glucose-Capillary: 222 mg/dL — ABNORMAL HIGH (ref 65–99)

## 2016-03-10 LAB — BODY FLUID CELL COUNT WITH DIFFERENTIAL
Eos, Fluid: 0 %
Lymphs, Fluid: 52 %
MONOCYTE-MACROPHAGE-SEROUS FLUID: 38 %
Neutrophil Count, Fluid: 10 %
Other Cells, Fluid: 0 %
WBC FLUID: 94 uL

## 2016-03-10 LAB — LACTATE DEHYDROGENASE, PLEURAL OR PERITONEAL FLUID: LD, Fluid: 87 U/L — ABNORMAL HIGH (ref 3–23)

## 2016-03-10 LAB — BASIC METABOLIC PANEL
Anion gap: 9 (ref 5–15)
BUN: 31 mg/dL — AB (ref 6–20)
CHLORIDE: 92 mmol/L — AB (ref 101–111)
CO2: 34 mmol/L — AB (ref 22–32)
CREATININE: 1.42 mg/dL — AB (ref 0.44–1.00)
Calcium: 7.8 mg/dL — ABNORMAL LOW (ref 8.9–10.3)
GFR calc Af Amer: 38 mL/min — ABNORMAL LOW (ref >60)
GFR calc non Af Amer: 32 mL/min — ABNORMAL LOW (ref >60)
Glucose, Bld: 132 mg/dL — ABNORMAL HIGH (ref 65–99)
Potassium: 3.8 mmol/L (ref 3.5–5.1)
Sodium: 135 mmol/L (ref 135–145)

## 2016-03-10 LAB — TROPONIN I: Troponin I: <0.03 ng/mL (ref <0.03)

## 2016-03-10 MED ORDER — PANTOPRAZOLE SODIUM 40 MG PO TBEC
40.0000 mg | DELAYED_RELEASE_TABLET | Freq: Two times a day (BID) | ORAL | Status: DC
  Administered 2016-03-10 – 2016-03-13 (×7): 40 mg via ORAL
  Filled 2016-03-10 (×7): qty 1

## 2016-03-10 MED ORDER — ALUM & MAG HYDROXIDE-SIMETH 200-200-20 MG/5ML PO SUSP
15.0000 mL | Freq: Two times a day (BID) | ORAL | Status: DC
  Administered 2016-03-10 – 2016-03-12 (×5): 15 mL via ORAL
  Filled 2016-03-10 (×6): qty 30

## 2016-03-10 MED ORDER — SODIUM CHLORIDE 0.9 % IV SOLN
INTRAVENOUS | Status: DC
  Administered 2016-03-10 – 2016-03-11 (×2): via INTRAVENOUS

## 2016-03-10 MED ORDER — DEXTROSE 5 % IV SOLN
1.0000 g | Freq: Every day | INTRAVENOUS | Status: DC
  Administered 2016-03-10 – 2016-03-11 (×2): 1 g via INTRAVENOUS
  Filled 2016-03-10 (×3): qty 10

## 2016-03-10 NOTE — Evaluation (Signed)
Physical Therapy Evaluation Patient Details Name: Sherry [name] MRN: [id number] DOB: 14-Sep-1927 Today's Date: 03/10/2016   History of Present Illness  Pt is a 83 y/o F who presented with chest pain.  EKG with a left bundle branch block which is old when compared to the previous records. Initial troponin is negative.  Imaging revealed large right and small left-sided pleural effusion.  Pt underwent thoracentesis 3/2.    Clinical Impression  Pt admitted with above diagnosis. Pt currently with functional limitations due to the deficits listed below (see PT Problem List). Ms. Vasconcellos presents with generalized weakness and fatigue but is willing to work with therapy.  Unclear on pt's PLOF, home layout, or level of assist available at home as not family not present and pt inconsistent with information provided.  She currently requires mod assist for bed mobility.  She reported 9/10 dizziness sitting EOB which did not improve and showed slow improvement upon returning to supine.  BP 128/39 ~3 minutes after returning to supine from sitting.  RN notified. Pt will benefit from skilled PT to increase their independence and safety with mobility to allow discharge to the venue listed below.      Follow Up Recommendations SNF    Equipment Recommendations  Other (comment) (TBD at next venue of care)    Recommendations for Other Services OT consult     Precautions / Restrictions Precautions Precautions: Fall Restrictions Weight Bearing Restrictions: No      Mobility  Bed Mobility Overal bed mobility: Needs Assistance Bed Mobility: Supine to Sit;Sit to Supine     Supine to sit: Mod assist;HOB elevated Sit to supine: Mod assist   General bed mobility comments: Mod assist to elevate trunk to achieve sitting.  Pt able to manage LEs and is able to scoot to EOB with increased time.  Pt reports 9/10 dizzines upon sitting which was not improving after sitting 1 minute.  To return to supine pt required mod  assist to guide trunk and reposition in bed.  Transfers                 General transfer comment: Unable to attempt due to dizzziness  Ambulation/Gait                Stairs            Wheelchair Mobility    Modified Rankin (Stroke Patients Only)       Balance Overall balance assessment: Needs assistance Sitting-balance support: Feet supported;Bilateral upper extremity supported Sitting balance-Leahy Scale: Fair Sitting balance - Comments: Close min guard as pt reported dizziness but pt able to remain steady sitting EOB with UEs supported                                     Pertinent Vitals/Pain Pain Assessment: No/denies pain    Home Living Family/patient expects to be discharged to:: Skilled nursing facility Living Arrangements: Children Available Help at Discharge: Available 24 hours/day Type of Home: Apartment Home Access: Stairs to enter   Entrance Stairs-Number of Steps: 4 steps with HR leading into dtr's house.   Home Layout: One level Home Equipment: Walker - 4 wheels Additional Comments: Pt reports that she was at [employer] but then tells the CM that she was at home.  Information above taken from previous notes.      Prior Function Level of Independence: Needs assistance   Gait /  Transfers Assistance Needed: Pt reports she was not ambulatory PTA but then says she was walking "a day ago".  Says she can walk about 7 steps using her RW.   ADL's / Homemaking Assistance Needed: Assist from daughter for bathing, dressing. Daughter does the cooking, cleaning, driving.         Hand Dominance   Dominant Hand: Right    Extremity/Trunk Assessment   Upper Extremity Assessment Upper Extremity Assessment:  (Strength grossly 3/5 BUE)    Lower Extremity Assessment Lower Extremity Assessment:  (Strength grossly 3/5 BLE)       Communication   Communication: HOH  Cognition Arousal/Alertness: Lethargic Behavior During  Therapy: Flat affect Overall Cognitive Status: No family/caregiver present to determine baseline cognitive functioning   Orientation Level: Disoriented to;Time;Place;Situation                  General Comments General comments (skin integrity, edema, etc.): Pt became dizzy during supine>sit reporting 9/10 dizziness.  BP reading 128/39 ~3 minutes after returning to supine from sitting.  Pt report that her dizziness has improved but continues to report 8/10 dizziness.  ~8 minutes of being supine pt reports 5/10 dizziness.    Exercises General Exercises - Upper Extremity Shoulder Flexion: AROM;Both;10 reps;Supine Elbow Flexion: AROM;Both;10 reps;Supine Elbow Extension: AROM;Both;10 reps;Supine General Exercises - Lower Extremity Ankle Circles/Pumps: AROM;Both;10 reps;Supine Heel Slides: AROM;Strengthening;Both;10 reps;Supine Hip ABduction/ADduction: AROM;Strengthening;Both;10 reps;Supine Straight Leg Raises: AROM;Strengthening;Both;10 reps;Supine   Assessment/Plan    PT Assessment Patient needs continued PT services  PT Problem List Decreased strength;Decreased activity tolerance;Decreased balance;Decreased mobility;Decreased knowledge of use of DME;Decreased safety awareness;Cardiopulmonary status limiting activity       PT Treatment Interventions DME instruction;Gait training;Functional mobility training;Therapeutic activities;Therapeutic exercise;Balance training;Neuromuscular re-education;Patient/family education    PT Goals (Current goals can be found in the Care Plan section)  Acute Rehab PT Goals Patient Stated Goal: none stated PT Goal Formulation: With patient Time For Goal Achievement: 03/24/16 Potential to Achieve Goals: Fair    Frequency Min 2X/week   Barriers to discharge   Unsure of assist available at home    Co-evaluation               End of Session Equipment Utilized During Treatment: Oxygen Activity Tolerance: Other (comment) (limited by  dizziness) Patient left: in bed;with call bell/phone within reach;with bed alarm set Nurse Communication: Mobility status;Other (comment) (pt's dizziness and BP) PT Visit Diagnosis: Muscle weakness (generalized) (M62.81)    Functional Assessment Tool Used: Clinical judgement;AM-PAC 6 Clicks Basic Mobility Functional Limitation: Mobility: Walking and moving around Mobility: Walking and Moving Around Current Status [id number]): At least 80 percent but less than 100 percent impaired, limited or restricted Mobility: Walking and Moving Around Goal Status [phone]): At least 60 percent but less than 80 percent impaired, limited or restricted    Time: 1400-1432 PT Time Calculation (min) (ACUTE ONLY): 32 min   Charges:   PT Evaluation $PT Eval Low Complexity: 1 Procedure PT Treatments $Therapeutic Exercise: 8-22 mins   PT G Codes:   PT G-Codes **NOT FOR INPATIENT CLASS** Functional Assessment Tool Used: Clinical judgement;AM-PAC 6 Clicks Basic Mobility Functional Limitation: Mobility: Walking and moving around Mobility: Walking and Moving Around Current Status [id number]): At least 80 percent but less than 100 percent impaired, limited or restricted Mobility: Walking and Moving Around Goal Status [phone]): At least 60 percent but less than 80 percent impaired, limited or restricted     Collie Siad PT, DPT 03/10/2016, 3:12 PM

## 2016-03-10 NOTE — Progress Notes (Signed)
[place] at [facility]                                                                                                                                                                                  Patient Demographics   Sherry [name], is a 83 y.o. female, DOB - 19-Mar-1927, [id number]  Admit date - 03/09/2016   Admitting Physician Nicholes Mango, MD  Outpatient Primary MD for the patient is [facility]   LOS - 0  Subjective: Patient continues to complain of pain in her chest. She was noted to have large pleural effusion which is was drained earlier. Patient also noticed to have significant urinary retention on the CT. Had to have a Foley placed in Place.    Review of Systems:   CONSTITUTIONAL: No documented fever. No fatigue, weakness. No weight gain, no weight loss.  EYES: No blurry or double vision.  ENT: No tinnitus. No postnasal drip. No redness of the oropharynx.  RESPIRATORY: No cough, no wheeze, no hemoptysis. No dyspnea.  CARDIOVASCULAR: Positive chest pain. No orthopnea. No palpitations. No syncope.  GASTROINTESTINAL: No nausea, no vomiting or diarrhea. No abdominal pain. No melena or hematochezia.  GENITOURINARY: No dysuria or hematuria.  ENDOCRINE: No polyuria or nocturia. No heat or cold intolerance.  HEMATOLOGY: No anemia. No bruising. No bleeding.  INTEGUMENTARY: No rashes. No lesions.  MUSCULOSKELETAL: No arthritis. No swelling. No gout.  NEUROLOGIC: No numbness, tingling, or ataxia. No seizure-type activity.  PSYCHIATRIC: No anxiety. No insomnia. No ADD.    Vitals:   Vitals:   03/09/16 1739 03/10/16 0641 03/10/16 0950 03/10/16 1100  BP: (!) 137/49 (!) 117/39 (!) 142/46 (!) 138/57  Pulse: 84 92  89  Resp: 18 14 16 16   Temp: 98.1 F (36.7 C) 98 F (36.7 C)    TempSrc: Oral     SpO2: 97% 95% 95% 94%  Weight: 164 lb 11.2 oz (74.7 kg)     Height: 5\' 4"  (1.626 m)       Wt Readings from Last 3 Encounters:   03/09/16 164 lb 11.2 oz (74.7 kg)  03/07/16 160 lb (72.6 kg)  02/29/16 176 lb 12.9 oz (80.2 kg)     Intake/Output Summary (Last 24 hours) at 03/10/16 1332 Last data filed at 03/10/16 1100  Gross per 24 hour  Intake                0 ml  Output             1900 ml  Net            -1900 ml    Physical Exam:  GENERAL: Pleasant-appearing in no apparent distress.  HEAD, EYES, EARS, NOSE AND THROAT: Atraumatic, normocephalic. Extraocular muscles are intact. Pupils equal and reactive to light. Sclerae anicteric. No conjunctival injection. No oro-pharyngeal erythema.  NECK: Supple. There is no jugular venous distention. No bruits, no lymphadenopathy, no thyromegaly.  HEART: Regular rate and rhythm,. No murmurs, no rubs, no clicks.  LUNGS: Clear to auscultation bilaterally. No rales or rhonchi. No wheezes.  ABDOMEN: Soft, flat, nontender, nondistended. Has good bowel sounds. No hepatosplenomegaly appreciated.  EXTREMITIES: No evidence of any cyanosis, clubbing, or peripheral edema.  +2 pedal and radial pulses bilaterally.  NEUROLOGIC: The patient is alert, awake, and oriented x3 with no focal motor or sensory deficits appreciated bilaterally.  SKIN: Moist and warm with no rashes appreciated.  Psych: Not anxious, depressed LN: No inguinal LN enlargement    Antibiotics   Anti-infectives    None      Medications   Scheduled Meds: . alum & mag hydroxide-simeth  15 mL Oral BID  . amLODipine  5 mg Oral Daily  . apixaban  5 mg Oral BID  . atorvastatin  20 mg Oral QHS  . feeding supplement (ENSURE ENLIVE)  237 mL Oral TID WC  . gabapentin  300 mg Oral QHS  . lisinopril  40 mg Oral Daily   And  . hydrochlorothiazide  25 mg Oral Daily  . insulin aspart  0-5 Units Subcutaneous QHS  . insulin aspart  0-9 Units Subcutaneous TID WC  . meloxicam  7.5 mg Oral Daily  . ondansetron  4 mg Oral BID  . pantoprazole  40 mg Oral BID   Continuous Infusions: PRN Meds:.acetaminophen, morphine  injection, ondansetron (ZOFRAN) IV   Data Review:   Micro Results Recent Results (from the past 240 hour(s))  Blood Culture (routine x 2)     Status: None (Preliminary result)   Collection Time: 03/09/16  3:11 PM  Result Value Ref Range Status   Specimen Description BLOOD RIGHT AC  Final   Special Requests BOTTLES DRAWN AEROBIC AND ANAEROBIC BCAV  Final   Culture NO GROWTH < 24 HOURS  Final   Report Status PENDING  Incomplete  Blood Culture (routine x 2)     Status: None (Preliminary result)   Collection Time: 03/09/16  3:40 PM  Result Value Ref Range Status   Specimen Description BLOOD RIGHT AC  Final   Special Requests BOTTLES DRAWN AEROBIC AND ANAEROBIC BCAV  Final   Culture NO GROWTH < 24 HOURS  Final   Report Status PENDING  Incomplete    Radiology Reports Dg Chest 1 View  Result Date: 03/10/2016 CLINICAL DATA:  Status post right thoracentesis. EXAM: CHEST 1 VIEW COMPARISON:  03/09/2016 FINDINGS: No significant residual right pleural fluid volume after thoracentesis. No pneumothorax. Residual atelectasis remains in both lower lungs, right greater than left. No pulmonary edema. The heart size is stable. IMPRESSION: No significant residual right pleural fluid after thoracentesis. No pneumothorax is seen. Both lower lobes demonstrate atelectasis, right greater than left. Electronically Signed   By: Aletta Edouard M.D.   On: 03/10/2016 11:18   Dg Chest 2 View  Result Date: 03/07/2016 CLINICAL DATA:  Hyperglycemia, dyspnea and weakness. Diabetes and hypertension. History of recent pulmonary embolus. EXAM: CHEST  2 VIEW COMPARISON:  February 26, 2016 chest CT and CXR FINDINGS: Low lung volumes are noted on current exam. Stable cardiomegaly with aortic atherosclerosis. Moderate right effusion with adjacent compressive atelectasis without significant change allowing for low lung volumes. It  appears slightly greater on the frontal projection does smaller on the lateral since previous. Small  left pleural effusion. No overt pulmonary edema or pneumothorax. ACDF of the cervical spine. Osteoarthritis of both glenohumeral and AC joints. Chronic stable degenerate change of the dorsal spine. IMPRESSION: 1. Low lung volumes on current exam with chronic moderate right and small left pleural effusions. Likely adjacent to compressive atelectasis due to the pleural effusions bilaterally. 2. Aortic atherosclerosis. Electronically Signed   By: Ashley Royalty M.D.   On: 03/07/2016 17:35   Dg Chest 2 View  Result Date: 02/26/2016 CLINICAL DATA:  Patient with weakness.  Shortness of breath. EXAM: CHEST  2 VIEW COMPARISON:  Chest radiograph 01/30/2016. FINDINGS: Anterior cervical spinal fusion hardware. Cardiomegaly. Monitoring leads overlie the patient. Patchy consolidation within the right mid and lower lung. Moderate right pleural effusion. Probable small left pleural effusion. IMPRESSION: Patchy consolidation within the right mid lower lung concerning for pneumonia in the appropriate clinical setting. Additional consideration would be atelectasis in the setting of pleural effusion. Moderate layering right pleural effusion. Probable small left pleural effusion. Electronically Signed   By: Lovey Newcomer M.D.   On: 02/26/2016 16:44   Ct Angio Chest Pe W Or Wo Contrast  Result Date: 03/09/2016 CLINICAL DATA:  Left-sided chest pain. Pain to palpation along the left abdomen. EXAM: CT ANGIOGRAPHY CHEST CT ABDOMEN AND PELVIS WITH CONTRAST TECHNIQUE: Multidetector CT imaging of the chest was performed using the standard protocol during bolus administration of intravenous contrast. Multiplanar CT image reconstructions and MIPs were obtained to evaluate the vascular anatomy. Multidetector CT imaging of the abdomen and pelvis was performed using the standard protocol during bolus administration of intravenous contrast. CONTRAST:  75 cc Isovue 370 intravenous COMPARISON:  02/26/2016 FINDINGS: CTA CHEST FINDINGS Cardiovascular:  Borderline cardiomegaly. Question small volume pericardial fluid posteriorly. Satisfactory opacification of the pulmonary arteries. Significant decrease in pulmonary artery clot that was seen previously. No web or new embolus identified. Normal right to left ventricle ratio. Atherosclerosis, including the coronary arteries. No acute aortic finding. Mediastinum/Nodes: Sliding hiatal hernia with prominent thickening an mucosal hyperenhancement of the lower esophagus. Superiorly the esophagus is patulous. Lungs/Pleura: Moderate to large right and small left pleural effusions with multi segment atelectasis. No edema or consolidation. Musculoskeletal: No acute or aggressive finding Review of the MIP images confirms the above findings. CT ABDOMEN and PELVIS FINDINGS Hepatobiliary: No focal liver abnormality.No evidence of biliary obstruction or stone. Pancreas: Atrophic appearance of the pancreas tail without evidence of mass or ductal dilatation. Spleen: Unremarkable. Adrenals/Urinary Tract: Negative adrenals. Mass within the collecting system of of the upper and interpolar right kidney with speckled calcifications. The mass is somewhat low-density, but still primarily concerning for urothelial carcinoma. Given low-density debris/chronic hemorrhage is also considered. No related obstruction. No high-density clot. Mild hydronephrosis on the left. The urinary bladder is markedly distended. Stomach/Bowel: No obstruction. No inflammatory changes. Hiatal hernia described above. Vascular/Lymphatic: No acute vascular abnormality. No mass or adenopathy. Reproductive:Negative Other: No ascites or pneumoperitoneum. Musculoskeletal: Remote L3 compression fracture. Lower lumbar degenerative disc narrowing. Review of the MIP images confirms the above findings. IMPRESSION: 1. Excellent clearing of previously seen pulmonary emboli. No interval embolus identified. 2. Large right and small left pleural effusions are stable from  02/26/2016. Multi segment atelectasis. 3. Intrarenal and renal pelvis collecting system mass on the right. Recommend workup for urothelial carcinoma. 4. Lower esophageal thickening and mucosal hyper enhancement, likely reflux esophagitis in the setting of hiatal hernia. Follow-up is  recommended. 5. Over distended urinary bladder. Electronically Signed   By: Monte Fantasia M.D.   On: 03/09/2016 15:04   Ct Angio Chest Pe W And/or Wo Contrast  Result Date: 02/26/2016 CLINICAL DATA:  Patient with shortness of breath. Irregular heart rate. EXAM: CT ANGIOGRAPHY CHEST WITH CONTRAST TECHNIQUE: Multidetector CT imaging of the chest was performed using the standard protocol during bolus administration of intravenous contrast. Multiplanar CT image reconstructions and MIPs were obtained to evaluate the vascular anatomy. CONTRAST:  75 cc Isovue 370 COMPARISON:  Chest radiograph 02/26/2016 FINDINGS: Cardiovascular: Heart is enlarged. Small pericardial effusion. Aorta and main pulmonary artery are normal in caliber. Filling defects are identified within the right lower lobe pulmonary arteries as well as left upper and left lower lobe pulmonary arteries. No CT evidence to suggest acute right heart strain. Coronary arterial vascular calcifications. Mediastinum/Nodes: No enlarged axillary, mediastinal or hilar lymphadenopathy. There is circumferential wall thickening of the mid and distal esophagus. Small hiatal hernia. Lungs/Pleura: Central airways are patent. Atelectasis of the right middle lobe. Consolidation within the right and left lower lobes. Moderate right and small left layering pleural effusions. No pneumothorax. Upper Abdomen: Liver is diffusely low in attenuation compatible with steatosis. Adrenal glands are normal. Musculoskeletal: Mid thoracic spine degenerative changes. No aggressive or acute appearing osseous lesions. Review of the MIP images confirms the above findings. IMPRESSION: Findings compatible with  acute pulmonary embolus within the right lower lobe, left upper lobe and left lower lobe pulmonary arteries. No evidence for right heart strain. There is circumferential wall thickening of the mid and distal esophagus. Acute esophagitis is not excluded. Small hiatal hernia. Consolidation of the right middle lobe with subpleural consolidation in the right lower and left lower lobes which may represent atelectasis. Infection not excluded. Moderate right and small left layering pleural effusions. Hepatic steatosis. Aortic atherosclerosis. Critical Value/emergent results were called by telephone at the time of interpretation on 02/26/2016 at 7:22 pm to Dr. Dorie Rank , who verbally acknowledged these results. Electronically Signed   By: Lovey Newcomer M.D.   On: 02/26/2016 19:23   Ct Abdomen Pelvis W Contrast  Result Date: 03/09/2016 CLINICAL DATA:  Left-sided chest pain. Pain to palpation along the left abdomen. EXAM: CT ANGIOGRAPHY CHEST CT ABDOMEN AND PELVIS WITH CONTRAST TECHNIQUE: Multidetector CT imaging of the chest was performed using the standard protocol during bolus administration of intravenous contrast. Multiplanar CT image reconstructions and MIPs were obtained to evaluate the vascular anatomy. Multidetector CT imaging of the abdomen and pelvis was performed using the standard protocol during bolus administration of intravenous contrast. CONTRAST:  75 cc Isovue 370 intravenous COMPARISON:  02/26/2016 FINDINGS: CTA CHEST FINDINGS Cardiovascular: Borderline cardiomegaly. Question small volume pericardial fluid posteriorly. Satisfactory opacification of the pulmonary arteries. Significant decrease in pulmonary artery clot that was seen previously. No web or new embolus identified. Normal right to left ventricle ratio. Atherosclerosis, including the coronary arteries. No acute aortic finding. Mediastinum/Nodes: Sliding hiatal hernia with prominent thickening an mucosal hyperenhancement of the lower esophagus.  Superiorly the esophagus is patulous. Lungs/Pleura: Moderate to large right and small left pleural effusions with multi segment atelectasis. No edema or consolidation. Musculoskeletal: No acute or aggressive finding Review of the MIP images confirms the above findings. CT ABDOMEN and PELVIS FINDINGS Hepatobiliary: No focal liver abnormality.No evidence of biliary obstruction or stone. Pancreas: Atrophic appearance of the pancreas tail without evidence of mass or ductal dilatation. Spleen: Unremarkable. Adrenals/Urinary Tract: Negative adrenals. Mass within the collecting system of of  the upper and interpolar right kidney with speckled calcifications. The mass is somewhat low-density, but still primarily concerning for urothelial carcinoma. Given low-density debris/chronic hemorrhage is also considered. No related obstruction. No high-density clot. Mild hydronephrosis on the left. The urinary bladder is markedly distended. Stomach/Bowel: No obstruction. No inflammatory changes. Hiatal hernia described above. Vascular/Lymphatic: No acute vascular abnormality. No mass or adenopathy. Reproductive:Negative Other: No ascites or pneumoperitoneum. Musculoskeletal: Remote L3 compression fracture. Lower lumbar degenerative disc narrowing. Review of the MIP images confirms the above findings. IMPRESSION: 1. Excellent clearing of previously seen pulmonary emboli. No interval embolus identified. 2. Large right and small left pleural effusions are stable from 02/26/2016. Multi segment atelectasis. 3. Intrarenal and renal pelvis collecting system mass on the right. Recommend workup for urothelial carcinoma. 4. Lower esophageal thickening and mucosal hyper enhancement, likely reflux esophagitis in the setting of hiatal hernia. Follow-up is recommended. 5. Over distended urinary bladder. Electronically Signed   By: Monte Fantasia M.D.   On: 03/09/2016 15:04   US Venous Img Lower Bilateral  Result Date: 02/27/2016 CLINICAL DATA:   Pulmonary thromboembolism yesterday EXAM: BILATERAL LOWER EXTREMITY VENOUS DUPLEX ULTRASOUND TECHNIQUE: Doppler venous assessment of the left lower extremity deep venous system was performed, including characterization of spectral flow, compressibility, and phasicity. COMPARISON:  None. FINDINGS: There is partially occlusive thrombus in the right common femoral vein. The femoral and popliteal veins are compressible and are without thrombus. Doppler analysis demonstrates respiratory phasicity in the deep venous system of the right lower extremity. In the left lower extremity, there is complete compressibility of the left common femoral, femoral, and popliteal veins. There is partially occlusive thrombus within the posterior tibial vein of the left calf. There is also superficial vein thrombus in the left lesser saphenous vein. IMPRESSION: The study is positive for DVT. There is partially occlusive thrombus in the right common femoral vein and left posterior tibial veins. The femoral and popliteal veins are patent bilaterally. Electronically Signed   By: Marybelle Killings M.D.   On: 02/27/2016 14:40   Dg Chest Portable 1 View  Result Date: 03/09/2016 CLINICAL DATA:  Shortness of breath and chest pain EXAM: PORTABLE CHEST 1 VIEW COMPARISON:  March 07, 2016 FINDINGS: There is partially loculated pleural effusion on the right. There is patchy atelectasis in the right mid lung and right base regions. There is no demonstrable airspace consolidation currently in this area. On the left, there is slight left base atelectasis. Left lung otherwise clear. Heart is mildly enlarged with pulmonary vascularity within normal limits. There is atherosclerotic calcification in the aorta. There is no evident adenopathy. There is atherosclerotic calcification in both carotid arteries. There is evidence of old trauma involving the acromioclavicular joint with separation in this area, chronic. Bones are osteoporotic. Postoperative changes  noted in the lower cervical spine. IMPRESSION: Partially loculated right pleural effusion with patchy atelectasis in the right mid and lower lung zones. No frank consolidation currently in the right base. Mild left base atelectasis. Stable cardiomegaly with pulmonary vascularity within normal limits. Bones osteoporotic. Chronic acromioclavicular separation on the right. Extensive carotid artery calcification bilaterally noted. There is also aortic atherosclerosis. Electronically Signed   By: Lowella Grip III M.D.   On: 03/09/2016 14:18   US Thoracentesis Asp Pleural Space W/img Guide  Result Date: 03/10/2016 INDICATION: Chest pain and right pleural effusion on recent imaging. EXAM: ULTRASOUND GUIDED RIGHT THORACENTESIS MEDICATIONS: None. COMPLICATIONS: None immediate. PROCEDURE: An ultrasound guided thoracentesis was thoroughly discussed with the patient and questions  answered. The benefits, risks, alternatives and complications were also discussed. The patient understands and wishes to proceed with the procedure. Written consent was obtained. Ultrasound was performed to localize and mark an adequate pocket of fluid in the right chest. The area was then prepped and draped in the normal sterile fashion. 1% Lidocaine was used for local anesthesia. Under ultrasound guidance a 6 Fr Safe-T-Centesis catheter was introduced. Thoracentesis was performed. The catheter was removed and a dressing applied. FINDINGS: A total of approximately 410 mL of yellow fluid was removed. Samples were sent to the laboratory as requested by the clinical team. IMPRESSION: Successful ultrasound guided right thoracentesis yielding 410 mL of pleural fluid. Electronically Signed   By: Markus Daft M.D.   On: 03/10/2016 11:30     CBC  Recent Labs Lab 03/07/16 1559 03/09/16 1324  WBC 6.2 5.2  HGB 10.2* 9.5*  HCT 31.2* 27.1*  PLT 256 293  MCV 86.7 83.6  MCH 28.3 29.2  MCHC 32.7 34.9  RDW 14.5 15.2*  LYMPHSABS 2.0  --    MONOABS 0.4  --   EOSABS 0.0  --   BASOSABS 0.0  --     Chemistries   Recent Labs Lab 03/07/16 1656 03/09/16 1324 03/10/16 0805  NA 132* 134* 135  K 3.3* 3.7 3.8  CL 87* 89* 92*  CO2 36* 35* 34*  GLUCOSE 240* 227* 132*  BUN 25* 32* 31*  CREATININE 1.26* 1.50* 1.42*  CALCIUM 8.1* 8.1* 7.8*  AST 16 14*  --   ALT 12* 10*  --   ALKPHOS 64 68  --   BILITOT 0.5 0.9  --    ------------------------------------------------------------------------------------------------------------------ estimated creatinine clearance is 28.1 mL/min (by C-G formula based on SCr of 1.42 mg/dL (H)). ------------------------------------------------------------------------------------------------------------------ No results for input(s): HGBA1C in the last 72 hours. ------------------------------------------------------------------------------------------------------------------ No results for input(s): CHOL, HDL, LDLCALC, TRIG, CHOLHDL, LDLDIRECT in the last 72 hours. ------------------------------------------------------------------------------------------------------------------ No results for input(s): TSH, T4TOTAL, T3FREE, THYROIDAB in the last 72 hours.  Invalid input(s): FREET3 ------------------------------------------------------------------------------------------------------------------ No results for input(s): VITAMINB12, FOLATE, FERRITIN, TIBC, IRON, RETICCTPCT in the last 72 hours.  Coagulation profile  Recent Labs Lab 03/09/16 1324  INR 1.83     Recent Labs  03/07/16 1656  DDIMER 1.39*    Cardiac Enzymes  Recent Labs Lab 03/09/16 1756 03/09/16 2040 03/09/16 2355  TROPONINI <0.03 <0.03 <0.03   ------------------------------------------------------------------------------------------------------------------ Invalid input(s): POCBNP    Assessment & Plan   Sherry [name]  is a 83 y.o. female with a known history of Diabetes mellitus, hypertension, hyper lipidemia  recent history of pulmonary embolism diagnosed on 02/26/2016 at [facility], currently on eliquis is coming to the ED via EMS with a chief complaint of chest pain. EKG with a left bundle branch block which is old when compared to the previous records. Initial troponin is negative. Patient is a poor historian from chronic dementia.    #Chest pain  Cardiac enzymes are negative Persistent symptoms I will ask cardiology to see In case this related to esophagitis I'll place her on PPIs twice a day   #Large right-sided pleural effusion etiology unclear Status post drainage  # H/o PE Pt is on Eliquis  CT shows less pulmonary artery burden  #Urinary retention Foley will be placed  #Possible ureteral cancer We'll check a UA with sent to cytology will need outpatient urology follow-up   # DM  Holding metformin as she just had CT angiogram with contrast Per diabetes coordinator stop glipizide Sliding  scale insulin  #Chronic kidney disease Creatinine at 1.5 seems to be at her baseline Continue close monitoring and avoid nephrotoxins  #UTI start iv antibiotic ceftriaxone  DVT prophylaxis on eliquis     Code Status Orders        Start     Ordered   03/09/16 1634  Do not attempt resuscitation (DNR)  Continuous    Question Answer Comment  In the event of cardiac or respiratory ARREST Do not call a "code blue"   In the event of cardiac or respiratory ARREST Do not perform Intubation, CPR, defibrillation or ACLS   In the event of cardiac or respiratory ARREST Use medication by any route, position, wound care, and other measures to relive pain and suffering. May use oxygen, suction and manual treatment of airway obstruction as needed for comfort.   Comments RN may pronounce      03/09/16 1633    Code Status History    Date Active Date Inactive Code Status Order ID Comments User Context   02/26/2016  9:39 PM 03/01/2016 10:37 PM DNR [id number]  Karmen Bongo, MD  Inpatient   02/26/2016  7:48 PM 02/26/2016  9:39 PM DNR [id number]  Dorie Rank, MD ED   01/31/2016  5:45 PM 02/07/2016 11:45 PM DNR [id number]  Flora Lipps, MD Inpatient   01/30/2016 11:19 PM 01/31/2016  5:44 PM Full Code [id number]  Harvie Bridge, DO Inpatient   11/26/2014  3:52 PM 11/28/2014  3:07 PM Full Code [id number]  Max Sane, MD Inpatient   11/26/2014  8:59 AM 11/26/2014  3:52 PM Full Code [id number]  Max Sane, MD ED    Advance Directive Documentation   Flowsheet Row Most Recent Value  Type of Advance Directive  Living will  Pre-existing out of facility DNR order (yellow form or pink MOST form)  No data  "MOST" Form in Place?  No data           Consults  cardiology   DVT Prophylaxis  eliquis  Lab Results  Component Value Date   PLT 293 03/09/2016     Time Spent in minutes  78min  Greater than 50% of time spent in care coordination and counseling patient regarding the condition and plan of care.   Dustin Flock M.D on 03/10/2016 at 1:32 PM  Between 7am to 6pm - Pager - [phone]  After 6pm go to [email or web address] - password [email or web address] [facility]   Office  [phone]

## 2016-03-10 NOTE — Consult Note (Signed)
[facility] Cardiology  CARDIOLOGY CONSULT NOTE  Patient ID: Sherry [name] MRN: [id number] DOB/AGE: [birth year] 81 y.o.  Admit date: 03/09/2016 Referring Physician [facility] Primary Physician [facility] Primary Cardiologist None on file Reason for Consultation Chest pain  HPI: 83 year old female referred for chest pain in the setting of a large right pleural effusion. Patient has a history of hypertension, hyperlipidemia, LBBB, diabetes, CKD, and recent history of pulmonary embolism that was diagnosed 02/26/16, on Eliquis. Patient presented to [facility] ER by EMS on 03/09/2016 with a chief complaint of chest pain. CT angiogram of the chest was significant for a large right pleural effusion and resolution of previous PE . Admission labs notable for negative serial troponin. ECG revealed sinus rhythm at a rate of 90 bpm with a known LBBB with 1st degree AV block with PR interval 216 ms, Qtc 501. She is status post right thoracentesis today, which removed 410 mL of fluid. Patient is a poor historian with underlying dementia, though she states she feels better after thoracentesis. She denies chest pain or palpitations. She has exertional shortness of breath, currently on oxygen nasal cannula. Recent echocardiogram on 02/29/2016 revealed mildly reduced left ventricular function with an LV EF 45-50% with severe concentric hypertrophy, diffuse hypokinesis, and moderate tricuspid regurgitation.     Past Medical History:  Diagnosis Date  . Diabetes mellitus without complication ([place])   . Hyperlipidemia   . Hypertension     Past Surgical History:  Procedure Laterality Date  . BACK SURGERY    . SHOULDER SURGERY      Prescriptions Prior to Admission  Medication Sig Dispense Refill Last Dose  . amLODipine (NORVASC) 5 MG tablet Take 5 mg by mouth daily.   03/08/2016 at 0800  . apixaban (ELIQUIS) 5 MG TABS tablet Take 1 tablet (5 mg total) by mouth 2 (two) times daily. 60 tablet 3 03/08/2016 at 1800  .  atorvastatin (LIPITOR) 20 MG tablet Take 1 tablet by mouth at bedtime.   03/08/2016 at 2000  . gabapentin (NEURONTIN) 300 MG capsule Take 1 capsule by mouth at bedtime.   03/08/2016 at 2000  . glipiZIDE (GLUCOTROL XL) 10 MG 24 hr tablet Take 1 tablet by mouth daily.   03/08/2016 at 0800  . Insulin Detemir (LEVEMIR) 100 UNIT/ML Pen Inject 13 Units into the skin every morning.   03/08/2016 at 0800  . lisinopril-hydrochlorothiazide (PRINZIDE,ZESTORETIC) 20-12.5 MG tablet Take 2 tablets by mouth daily.    03/08/2016 at 0800  . meloxicam (MOBIC) 7.5 MG tablet Take 7.5 mg by mouth daily.   03/08/2016 at 0800  . metFORMIN (GLUCOPHAGE) 1000 MG tablet Take 1,000 mg by mouth 2 (two) times daily with a meal.   03/08/2016 at 1800  . acetaminophen (TYLENOL) 325 MG tablet Take 650 mg by mouth every 4 (four) hours as needed for mild pain or moderate pain.   prn at prn  . apixaban (ELIQUIS) 5 MG TABS tablet Take 2 tablets (10 mg total) by mouth 2 (two) times daily. (Patient not taking: Reported on 03/09/2016) 28 tablet 0 Not Taking at Unknown time  . feeding supplement, ENSURE ENLIVE, (ENSURE ENLIVE) LIQD Take 237 mLs by mouth 3 (three) times daily with meals. 237 mL 12   . ondansetron (ZOFRAN) 4 MG tablet Take 4 mg by mouth 2 (two) times daily. *May take every 8 hours as needed for nausea and vomiting   prn at prn  . potassium chloride SA (K-DUR,KLOR-CON) 20 MEQ tablet Take 1 tablet (20 mEq  total) by mouth 2 (two) times daily. For 4 days (Patient not taking: Reported on 03/09/2016) 8 tablet 0 Not Taking at Unknown time   Social History   Social History  . Marital status: Single    Spouse name: N/A  . Number of children: N/A  . Years of education: N/A   Occupational History  . Not on file.   Social History Main Topics  . Smoking status: Never Smoker  . Smokeless tobacco: Never Used  . Alcohol use No  . Drug use: No  . Sexual activity: Not on file   Other Topics Concern  . Not on file   Social History  Narrative  . No narrative on file    History reviewed. No pertinent family history.    Review of systems complete and found to be negative unless listed above      PHYSICAL EXAM  General: Well developed, well nourished, in no acute distress HEENT:  Normocephalic and atramatic Neck:  No JVD.  Lungs: Clear bilaterally to auscultation Heart: HRRR . Normal S1 and S2, 2/6 holosystolic murmur Abdomen: Bowel sounds are positive, abdomen soft and non-tender  Msk:  Back normal, Patient lying in bed, able to sit upright with some assistance Extremities: No cyanosis, 1-2+ bilateral lower extremity edema.   Neuro: Alert, oriented Psych:  Flat affect, responds appropriately  Labs:   Lab Results  Component Value Date   WBC 5.2 03/09/2016   HGB 9.5 (L) 03/09/2016   HCT 27.1 (L) 03/09/2016   MCV 83.6 03/09/2016   PLT 293 03/09/2016    Recent Labs Lab 03/09/16 1324 03/10/16 0805  NA 134* 135  K 3.7 3.8  CL 89* 92*  CO2 35* 34*  BUN 32* 31*  CREATININE 1.50* 1.42*  CALCIUM 8.1* 7.8*  PROT 5.5*  --   BILITOT 0.9  --   ALKPHOS 68  --   ALT 10*  --   AST 14*  --   GLUCOSE 227* 132*   Lab Results  Component Value Date   CKTOTAL 70 04/21/2012   CKMB 1.2 04/21/2012   TROPONINI <0.03 03/09/2016   No results found for: CHOL No results found for: HDL No results found for: LDLCALC No results found for: TRIG No results found for: CHOLHDL No results found for: LDLDIRECT    Radiology: Dg Chest 1 View  Result Date: 03/10/2016 CLINICAL DATA:  Status post right thoracentesis. EXAM: CHEST 1 VIEW COMPARISON:  03/09/2016 FINDINGS: No significant residual right pleural fluid volume after thoracentesis. No pneumothorax. Residual atelectasis remains in both lower lungs, right greater than left. No pulmonary edema. The heart size is stable. IMPRESSION: No significant residual right pleural fluid after thoracentesis. No pneumothorax is seen. Both lower lobes demonstrate atelectasis, right  greater than left. Electronically Signed   By: Aletta Edouard M.D.   On: 03/10/2016 11:18   Dg Chest 2 View  Result Date: 03/07/2016 CLINICAL DATA:  Hyperglycemia, dyspnea and weakness. Diabetes and hypertension. History of recent pulmonary embolus. EXAM: CHEST  2 VIEW COMPARISON:  February 26, 2016 chest CT and CXR FINDINGS: Low lung volumes are noted on current exam. Stable cardiomegaly with aortic atherosclerosis. Moderate right effusion with adjacent compressive atelectasis without significant change allowing for low lung volumes. It appears slightly greater on the frontal projection does smaller on the lateral since previous. Small left pleural effusion. No overt pulmonary edema or pneumothorax. ACDF of the cervical spine. Osteoarthritis of both glenohumeral and AC joints. Chronic stable degenerate change of the  dorsal spine. IMPRESSION: 1. Low lung volumes on current exam with chronic moderate right and small left pleural effusions. Likely adjacent to compressive atelectasis due to the pleural effusions bilaterally. 2. Aortic atherosclerosis. Electronically Signed   By: Ashley Royalty M.D.   On: 03/07/2016 17:35   Dg Chest 2 View  Result Date: 02/26/2016 CLINICAL DATA:  Patient with weakness.  Shortness of breath. EXAM: CHEST  2 VIEW COMPARISON:  Chest radiograph 01/30/2016. FINDINGS: Anterior cervical spinal fusion hardware. Cardiomegaly. Monitoring leads overlie the patient. Patchy consolidation within the right mid and lower lung. Moderate right pleural effusion. Probable small left pleural effusion. IMPRESSION: Patchy consolidation within the right mid lower lung concerning for pneumonia in the appropriate clinical setting. Additional consideration would be atelectasis in the setting of pleural effusion. Moderate layering right pleural effusion. Probable small left pleural effusion. Electronically Signed   By: Lovey Newcomer M.D.   On: 02/26/2016 16:44   Ct Angio Chest Pe W Or Wo Contrast  Result  Date: 03/09/2016 CLINICAL DATA:  Left-sided chest pain. Pain to palpation along the left abdomen. EXAM: CT ANGIOGRAPHY CHEST CT ABDOMEN AND PELVIS WITH CONTRAST TECHNIQUE: Multidetector CT imaging of the chest was performed using the standard protocol during bolus administration of intravenous contrast. Multiplanar CT image reconstructions and MIPs were obtained to evaluate the vascular anatomy. Multidetector CT imaging of the abdomen and pelvis was performed using the standard protocol during bolus administration of intravenous contrast. CONTRAST:  75 cc Isovue 370 intravenous COMPARISON:  02/26/2016 FINDINGS: CTA CHEST FINDINGS Cardiovascular: Borderline cardiomegaly. Question small volume pericardial fluid posteriorly. Satisfactory opacification of the pulmonary arteries. Significant decrease in pulmonary artery clot that was seen previously. No web or new embolus identified. Normal right to left ventricle ratio. Atherosclerosis, including the coronary arteries. No acute aortic finding. Mediastinum/Nodes: Sliding hiatal hernia with prominent thickening an mucosal hyperenhancement of the lower esophagus. Superiorly the esophagus is patulous. Lungs/Pleura: Moderate to large right and small left pleural effusions with multi segment atelectasis. No edema or consolidation. Musculoskeletal: No acute or aggressive finding Review of the MIP images confirms the above findings. CT ABDOMEN and PELVIS FINDINGS Hepatobiliary: No focal liver abnormality.No evidence of biliary obstruction or stone. Pancreas: Atrophic appearance of the pancreas tail without evidence of mass or ductal dilatation. Spleen: Unremarkable. Adrenals/Urinary Tract: Negative adrenals. Mass within the collecting system of of the upper and interpolar right kidney with speckled calcifications. The mass is somewhat low-density, but still primarily concerning for urothelial carcinoma. Given low-density debris/chronic hemorrhage is also considered. No related  obstruction. No high-density clot. Mild hydronephrosis on the left. The urinary bladder is markedly distended. Stomach/Bowel: No obstruction. No inflammatory changes. Hiatal hernia described above. Vascular/Lymphatic: No acute vascular abnormality. No mass or adenopathy. Reproductive:Negative Other: No ascites or pneumoperitoneum. Musculoskeletal: Remote L3 compression fracture. Lower lumbar degenerative disc narrowing. Review of the MIP images confirms the above findings. IMPRESSION: 1. Excellent clearing of previously seen pulmonary emboli. No interval embolus identified. 2. Large right and small left pleural effusions are stable from 02/26/2016. Multi segment atelectasis. 3. Intrarenal and renal pelvis collecting system mass on the right. Recommend workup for urothelial carcinoma. 4. Lower esophageal thickening and mucosal hyper enhancement, likely reflux esophagitis in the setting of hiatal hernia. Follow-up is recommended. 5. Over distended urinary bladder. Electronically Signed   By: Monte Fantasia M.D.   On: 03/09/2016 15:04   Ct Angio Chest Pe W And/or Wo Contrast  Result Date: 02/26/2016 CLINICAL DATA:  Patient with shortness of breath. Irregular  heart rate. EXAM: CT ANGIOGRAPHY CHEST WITH CONTRAST TECHNIQUE: Multidetector CT imaging of the chest was performed using the standard protocol during bolus administration of intravenous contrast. Multiplanar CT image reconstructions and MIPs were obtained to evaluate the vascular anatomy. CONTRAST:  75 cc Isovue 370 COMPARISON:  Chest radiograph 02/26/2016 FINDINGS: Cardiovascular: Heart is enlarged. Small pericardial effusion. Aorta and main pulmonary artery are normal in caliber. Filling defects are identified within the right lower lobe pulmonary arteries as well as left upper and left lower lobe pulmonary arteries. No CT evidence to suggest acute right heart strain. Coronary arterial vascular calcifications. Mediastinum/Nodes: No enlarged axillary,  mediastinal or hilar lymphadenopathy. There is circumferential wall thickening of the mid and distal esophagus. Small hiatal hernia. Lungs/Pleura: Central airways are patent. Atelectasis of the right middle lobe. Consolidation within the right and left lower lobes. Moderate right and small left layering pleural effusions. No pneumothorax. Upper Abdomen: Liver is diffusely low in attenuation compatible with steatosis. Adrenal glands are normal. Musculoskeletal: Mid thoracic spine degenerative changes. No aggressive or acute appearing osseous lesions. Review of the MIP images confirms the above findings. IMPRESSION: Findings compatible with acute pulmonary embolus within the right lower lobe, left upper lobe and left lower lobe pulmonary arteries. No evidence for right heart strain. There is circumferential wall thickening of the mid and distal esophagus. Acute esophagitis is not excluded. Small hiatal hernia. Consolidation of the right middle lobe with subpleural consolidation in the right lower and left lower lobes which may represent atelectasis. Infection not excluded. Moderate right and small left layering pleural effusions. Hepatic steatosis. Aortic atherosclerosis. Critical Value/emergent results were called by telephone at the time of interpretation on 02/26/2016 at 7:22 pm to Dr. Dorie Rank , who verbally acknowledged these results. Electronically Signed   By: Lovey Newcomer M.D.   On: 02/26/2016 19:23   Ct Abdomen Pelvis W Contrast  Result Date: 03/09/2016 CLINICAL DATA:  Left-sided chest pain. Pain to palpation along the left abdomen. EXAM: CT ANGIOGRAPHY CHEST CT ABDOMEN AND PELVIS WITH CONTRAST TECHNIQUE: Multidetector CT imaging of the chest was performed using the standard protocol during bolus administration of intravenous contrast. Multiplanar CT image reconstructions and MIPs were obtained to evaluate the vascular anatomy. Multidetector CT imaging of the abdomen and pelvis was performed using the  standard protocol during bolus administration of intravenous contrast. CONTRAST:  75 cc Isovue 370 intravenous COMPARISON:  02/26/2016 FINDINGS: CTA CHEST FINDINGS Cardiovascular: Borderline cardiomegaly. Question small volume pericardial fluid posteriorly. Satisfactory opacification of the pulmonary arteries. Significant decrease in pulmonary artery clot that was seen previously. No web or new embolus identified. Normal right to left ventricle ratio. Atherosclerosis, including the coronary arteries. No acute aortic finding. Mediastinum/Nodes: Sliding hiatal hernia with prominent thickening an mucosal hyperenhancement of the lower esophagus. Superiorly the esophagus is patulous. Lungs/Pleura: Moderate to large right and small left pleural effusions with multi segment atelectasis. No edema or consolidation. Musculoskeletal: No acute or aggressive finding Review of the MIP images confirms the above findings. CT ABDOMEN and PELVIS FINDINGS Hepatobiliary: No focal liver abnormality.No evidence of biliary obstruction or stone. Pancreas: Atrophic appearance of the pancreas tail without evidence of mass or ductal dilatation. Spleen: Unremarkable. Adrenals/Urinary Tract: Negative adrenals. Mass within the collecting system of of the upper and interpolar right kidney with speckled calcifications. The mass is somewhat low-density, but still primarily concerning for urothelial carcinoma. Given low-density debris/chronic hemorrhage is also considered. No related obstruction. No high-density clot. Mild hydronephrosis on the left. The urinary bladder is  markedly distended. Stomach/Bowel: No obstruction. No inflammatory changes. Hiatal hernia described above. Vascular/Lymphatic: No acute vascular abnormality. No mass or adenopathy. Reproductive:Negative Other: No ascites or pneumoperitoneum. Musculoskeletal: Remote L3 compression fracture. Lower lumbar degenerative disc narrowing. Review of the MIP images confirms the above  findings. IMPRESSION: 1. Excellent clearing of previously seen pulmonary emboli. No interval embolus identified. 2. Large right and small left pleural effusions are stable from 02/26/2016. Multi segment atelectasis. 3. Intrarenal and renal pelvis collecting system mass on the right. Recommend workup for urothelial carcinoma. 4. Lower esophageal thickening and mucosal hyper enhancement, likely reflux esophagitis in the setting of hiatal hernia. Follow-up is recommended. 5. Over distended urinary bladder. Electronically Signed   By: Monte Fantasia M.D.   On: 03/09/2016 15:04   US Venous Img Lower Bilateral  Result Date: 02/27/2016 CLINICAL DATA:  Pulmonary thromboembolism yesterday EXAM: BILATERAL LOWER EXTREMITY VENOUS DUPLEX ULTRASOUND TECHNIQUE: Doppler venous assessment of the left lower extremity deep venous system was performed, including characterization of spectral flow, compressibility, and phasicity. COMPARISON:  None. FINDINGS: There is partially occlusive thrombus in the right common femoral vein. The femoral and popliteal veins are compressible and are without thrombus. Doppler analysis demonstrates respiratory phasicity in the deep venous system of the right lower extremity. In the left lower extremity, there is complete compressibility of the left common femoral, femoral, and popliteal veins. There is partially occlusive thrombus within the posterior tibial vein of the left calf. There is also superficial vein thrombus in the left lesser saphenous vein. IMPRESSION: The study is positive for DVT. There is partially occlusive thrombus in the right common femoral vein and left posterior tibial veins. The femoral and popliteal veins are patent bilaterally. Electronically Signed   By: Marybelle Killings M.D.   On: 02/27/2016 14:40   Dg Chest Portable 1 View  Result Date: 03/09/2016 CLINICAL DATA:  Shortness of breath and chest pain EXAM: PORTABLE CHEST 1 VIEW COMPARISON:  March 07, 2016 FINDINGS: There  is partially loculated pleural effusion on the right. There is patchy atelectasis in the right mid lung and right base regions. There is no demonstrable airspace consolidation currently in this area. On the left, there is slight left base atelectasis. Left lung otherwise clear. Heart is mildly enlarged with pulmonary vascularity within normal limits. There is atherosclerotic calcification in the aorta. There is no evident adenopathy. There is atherosclerotic calcification in both carotid arteries. There is evidence of old trauma involving the acromioclavicular joint with separation in this area, chronic. Bones are osteoporotic. Postoperative changes noted in the lower cervical spine. IMPRESSION: Partially loculated right pleural effusion with patchy atelectasis in the right mid and lower lung zones. No frank consolidation currently in the right base. Mild left base atelectasis. Stable cardiomegaly with pulmonary vascularity within normal limits. Bones osteoporotic. Chronic acromioclavicular separation on the right. Extensive carotid artery calcification bilaterally noted. There is also aortic atherosclerosis. Electronically Signed   By: Lowella Grip III M.D.   On: 03/09/2016 14:18   US Thoracentesis Asp Pleural Space W/img Guide  Result Date: 03/10/2016 INDICATION: Chest pain and right pleural effusion on recent imaging. EXAM: ULTRASOUND GUIDED RIGHT THORACENTESIS MEDICATIONS: None. COMPLICATIONS: None immediate. PROCEDURE: An ultrasound guided thoracentesis was thoroughly discussed with the patient and questions answered. The benefits, risks, alternatives and complications were also discussed. The patient understands and wishes to proceed with the procedure. Written consent was obtained. Ultrasound was performed to localize and mark an adequate pocket of fluid in the right chest. The area was  then prepped and draped in the normal sterile fashion. 1% Lidocaine was used for local anesthesia. Under ultrasound  guidance a 6 Fr Safe-T-Centesis catheter was introduced. Thoracentesis was performed. The catheter was removed and a dressing applied. FINDINGS: A total of approximately 410 mL of yellow fluid was removed. Samples were sent to the laboratory as requested by the clinical team. IMPRESSION: Successful ultrasound guided right thoracentesis yielding 410 mL of pleural fluid. Electronically Signed   By: Markus Daft M.D.   On: 03/10/2016 11:30    EKG: Sinus rhythm, rate 89 bpm, known LBBB, 1st degree AV block  ASSESSMENT AND PLAN:  1. Chest pain with negative serial troponin and ECG with known LBBB, currently without chest pain, status post right thoracentesis of large pleural effusion.  2. 1st degree AV block 3. Prolonged Qtc 506, not on QT-prolonging medications  Recommendations: 1. Continue current therapy. 2. Defer cardiac diagnostics or cardiac catheterization at this time in the absence of chest pain or elevated troponin. 3. Avoid QT prolonging medications  Signed: Clabe Seal, PA-C 03/10/2016, 12:52 PM

## 2016-03-10 NOTE — Progress Notes (Signed)
Bladder scanned patient due to inability to void scan showed >600. Dr Bridgett Larsson notified orders received for in and out cath. Will proceed

## 2016-03-10 NOTE — Care Management Obs Status (Signed)
[facility] NOTIFICATION   Patient Details  Name: Sherry [name] MRN: [id number] Date of Birth: 1927/06/07   Medicare Observation Status Notification Given:  Yes    Katrina Stack, RN 03/10/2016, 5:18 PM

## 2016-03-10 NOTE — Care Management (Signed)
Confirmed that patient is open to [facility] and PT.  There had been topics of discussion for placement so were awaiting order for social worker in the home to coordinate.  If patient returns home, will need to add SW and aide to the referral.

## 2016-03-10 NOTE — Progress Notes (Signed)
Attempted to obtain orthostatic VS on patient and was unable to obtain standing BP. Patient had a glossed over look and appeared as though she was about to pass out. She stated that she got really dizzy after getting her back to bed. Dr. Posey Pronto notified orders received for NS at 80.

## 2016-03-10 NOTE — Progress Notes (Signed)
Palliative Medicine Team  Due to high volume of referrals, there is a delay seeing this patient. PMT not at [facility] over the weekend but will arrange goals of care with patient and family next week. Thank you for the opportunity to participate in the care of Ms. Sherry [name].   NO CHARGE  Ihor Dow, FNP-C Palliative Medicine Team  Phone: [phone] Fax: [phone]

## 2016-03-10 NOTE — Progress Notes (Signed)
Inpatient Diabetes Program Recommendations  AACE/ADA: New Consensus Statement on Inpatient Glycemic Control (2015)  Target Ranges:  Prepandial:   less than 140 mg/dL      Peak postprandial:   less than 180 mg/dL (1-2 hours)      Critically ill patients:  140 - 180 mg/dL   Results for HARLI, MAGWOOD (MRN [id number]) as of 03/10/2016 09:31  Ref. Range 03/09/2016 21:02 03/10/2016 07:49  Glucose-Capillary Latest Ref Range: 65 - 99 mg/dL 165 (H) 131 (H)   Review of Glycemic Control  Diabetes history: DM2 Outpatient Diabetes medications: Levemir 13 units QAM, Glipizide XL 10 mg daily, Metformin 1000 mg BID Current orders for Inpatient glycemic control:  Levemir 13 units QAM, Glipizide XL 10 mg daily, Novolog 0-9 units TID with meals, Novolog 0-5 units QHS  Inpatient Diabetes Program Recommendations: Insulin - Basal: Patient has not received any Levemir since being admitted and per home med list Levemir was last taken on 2/28. Please discontinue Levemir at this time. Oral Agents: Please discontinue Glipizide XL while inpatient.   Thanks, Barnie Alderman, RN, MSN, CDE Diabetes Coordinator Inpatient Diabetes Program [phone] (Team Pager from 8am to 5pm)

## 2016-03-10 NOTE — Procedures (Addendum)
US guided right thoracentesis.  Removed 410 ml of yellow fluid.  No immediate complication.  CXR ordered.

## 2016-03-11 LAB — GLUCOSE, CAPILLARY
GLUCOSE-CAPILLARY: 91 mg/dL (ref 65–99)
GLUCOSE-CAPILLARY: 165 mg/dL — AB (ref 65–99)
GLUCOSE-CAPILLARY: 154 mg/dL — AB (ref 65–99)
Glucose-Capillary: 87 mg/dL (ref 65–99)
Glucose-Capillary: 147 mg/dL — ABNORMAL HIGH (ref 65–99)

## 2016-03-11 MED ORDER — TAMSULOSIN HCL 0.4 MG PO CAPS
0.4000 mg | ORAL_CAPSULE | Freq: Every day | ORAL | Status: DC
  Administered 2016-03-12 – 2016-03-13 (×2): 0.4 mg via ORAL
  Filled 2016-03-11 (×2): qty 1

## 2016-03-11 NOTE — NC FL2 (Signed)
[facility] LEVEL OF CARE SCREENING TOOL     IDENTIFICATION  Patient Name: Sherry [name] Birthdate: July 22, 1927 Sex: female Admission Date (Current Location): 03/09/2016  [place] and [place] Number:  Engineering geologist and Address:  [facility], [address] [place], [place] [address]      Provider Number: [id number]  Attending Physician Name and Address:  Dustin Flock, MD  Relative Name and Phone Number:       Current Level of Care: Hospital Recommended Level of Care: [place] Prior Approval Number:    Date Approved/Denied:   PASRR Number: [id number]  Discharge Plan: SNF    Current Diagnoses: Patient Active Problem List   Diagnosis Date Noted  . Chest pain 03/09/2016  . PE (pulmonary thromboembolism) ([place]) 02/26/2016  . Diabetes mellitus, type 2 ([place]) 02/26/2016  . Anemia 02/26/2016  . Chronic kidney disease (CKD), stage III (moderate) 02/26/2016  . Essential hypertension 02/26/2016  . Goals of care, counseling/discussion   . Adult failure to thrive syndrome   . DNR (do not resuscitate)   . Palliative care by specialist   . Sepsis ([place]) 11/26/2014    Orientation RESPIRATION BLADDER Height & Weight     Self, Time, Situation, Place  O2 (2L o2) Incontinent Weight: 164 lb 11.2 oz (74.7 kg) Height:  5\' 4"  (162.6 cm)  BEHAVIORAL SYMPTOMS/MOOD NEUROLOGICAL BOWEL NUTRITION STATUS      Incontinent Diet (Diabetes friendly/Cardiac)  AMBULATORY STATUS COMMUNICATION OF NEEDS Skin   Extensive Assist Verbally Normal                       Personal Care Assistance Level of Assistance  Bathing, Feeding, Dressing Bathing Assistance: Limited assistance Feeding assistance: Independent Dressing Assistance: Limited assistance     Functional Limitations Info             SPECIAL CARE FACTORS FREQUENCY  PT (By licensed PT)     PT Frequency: Up to 5X per day, 5 days per week              Contractures  Contractures Info: Present    Additional Factors Info                  Current Medications (03/11/2016):  This is the current hospital active medication list Current Facility-Administered Medications  Medication Dose Route Frequency Provider Last Rate Last Dose  . acetaminophen (TYLENOL) tablet 650 mg  650 mg Oral Q4H PRN Nicholes Mango, MD      . alum & mag hydroxide-simeth (MAALOX/MYLANTA) 200-200-20 MG/5ML suspension 15 mL  15 mL Oral BID Dustin Flock, MD   15 mL at 03/11/16 1056  . amLODipine (NORVASC) tablet 5 mg  5 mg Oral Daily Nicholes Mango, MD   5 mg at 03/11/16 1058  . apixaban (ELIQUIS) tablet 5 mg  5 mg Oral BID Nicholes Mango, MD   5 mg at 03/11/16 1056  . atorvastatin (LIPITOR) tablet 20 mg  20 mg Oral QHS Nicholes Mango, MD   20 mg at 03/10/16 2321  . cefTRIAXone (ROCEPHIN) 1 g in dextrose 5 % 50 mL IVPB  1 g Intravenous q1800 Dustin Flock, MD   1 g at 03/10/16 1500  . feeding supplement (ENSURE ENLIVE) (ENSURE ENLIVE) liquid 237 mL  237 mL Oral TID WC Aruna Gouru, MD   237 mL at 03/11/16 0800  . gabapentin (NEURONTIN) capsule 300 mg  300 mg Oral QHS Nicholes Mango, MD  300 mg at 03/10/16 2321  . insulin aspart (novoLOG) injection 0-5 Units  0-5 Units Subcutaneous QHS Aruna Gouru, MD      . insulin aspart (novoLOG) injection 0-9 Units  0-9 Units Subcutaneous TID WC Nicholes Mango, MD   1 Units at 03/11/16 1230  . morphine 4 MG/ML injection 2 mg  2 mg Intravenous Q4H PRN Nicholes Mango, MD      . ondansetron (ZOFRAN) injection 4 mg  4 mg Intravenous Q6H PRN Nicholes Mango, MD      . ondansetron (ZOFRAN) tablet 4 mg  4 mg Oral BID Nicholes Mango, MD   4 mg at 03/11/16 1056  . pantoprazole (PROTONIX) EC tablet 40 mg  40 mg Oral BID Dustin Flock, MD   40 mg at 03/11/16 1056     Discharge Medications: Please see discharge summary for a list of discharge medications.  Relevant Imaging Results:  Relevant Lab Results:   Additional Information SS# [id number]  Zettie Pho, LCSW

## 2016-03-11 NOTE — Clinical Social Work Note (Addendum)
Clinical Social Work Assessment  Patient Details  Name: Sherry [name] MRN: [id number] Date of Birth: April 09, 1927  Date of referral:  03/11/16               Reason for consult:  Facility Placement                Permission sought to share information with:  Chartered certified accountant granted to share information::  Yes, Verbal Permission Granted  Name::        Agency::     Relationship::     Contact Information:     Housing/Transportation Living arrangements for the past 2 months:  Single Family Home Source of Information:  Patient Patient Interpreter Needed:  None Criminal Activity/Legal Involvement Pertinent to Current Situation/Hospitalization:  No - Comment as needed Significant Relationships:  Adult Children Lives with:  Adult Children Do you feel safe going back to the place where you live?  Yes Need for family participation in patient care:  No (Coment)  Care giving concerns:  PT recommendation for SNF STR   Social Worker assessment / plan:  CSW met with patient at bedside to discuss dc planning. CSW explained the referral process. The patient indicated that she has no preference other than no facilities outside of [place]. The patient lives with her son, and her daughter assists with medication management. The patient ambulates at baseline with a RW and is incontinent of bladder and bowel. The patient is able to feed and dress herself, but she does need help with bathing.  [name] daughter named her preferences as [place] [facility] with [facility] as her second choice.     Employment status:  Retired Nurse, adult PT Recommendations:  [place] / Referral to community resources:  [place]  Patient/Family's Response to care:  The patient thanked the CSW for assistance.  Patient/Family's Understanding of and Emotional Response to Diagnosis, Current Treatment, and Prognosis:  The  patient is in agreement with dc to STR.  Emotional Assessment Appearance:  Appears stated age Attitude/Demeanor/Rapport:   (Pleasant, Alert) Affect (typically observed):  Accepting, Appropriate, Pleasant Orientation:  Oriented to Self, Oriented to Place, Oriented to  Time, Oriented to Situation Alcohol / Substance use:  Never Used Psych involvement (Current and /or in the community):  No (Comment)  Discharge Needs  Concerns to be addressed:  Care Coordination Readmission within the last 30 days:  No Current discharge risk:  None Barriers to Discharge:  Continued Medical Work up   [employer], LCSW 03/11/2016, 3:46 PM

## 2016-03-11 NOTE — Progress Notes (Signed)
[place] at [facility]                                                                                                                                                                                  Patient Demographics   Sherry [name], is a 83 y.o. female, DOB - Jul 12, 1927, [id number]  Admit date - 03/09/2016   Admitting Physician Nicholes Mango, MD  Outpatient Primary MD for the patient is [facility]   LOS - 0  Subjective: States the chest pain is resolved. Patient noted to have urinary retention requiring in and out catheter She is also noticed to have urinary tract infection She was seen by physical therapy they recommended skilled nursing facility patient is very weak   Review of Systems:   CONSTITUTIONAL: No documented fever. Positive  fatigue, weakness. No weight gain, no weight loss.  EYES: No blurry or double vision.  ENT: No tinnitus. No postnasal drip. No redness of the oropharynx.  RESPIRATORY: No cough, no wheeze, no hemoptysis. No dyspnea.  CARDIOVASCULAR: Positive chest pain. No orthopnea. No palpitations. No syncope.  GASTROINTESTINAL: No nausea, no vomiting or diarrhea. No abdominal pain. No melena or hematochezia.  GENITOURINARY: No dysuria or hematuria.  ENDOCRINE: No polyuria or nocturia. No heat or cold intolerance.  HEMATOLOGY: No anemia. No bruising. No bleeding.  INTEGUMENTARY: No rashes. No lesions.  MUSCULOSKELETAL: No arthritis. No swelling. No gout.  NEUROLOGIC: No numbness, tingling, or ataxia. No seizure-type activity.  PSYCHIATRIC: No anxiety. No insomnia. No ADD.    Vitals:   Vitals:   03/11/16 0442 03/11/16 0914 03/11/16 0921 03/11/16 0923  BP: (!) 106/55 (!) 123/39 (!) 116/37 (!) 157/76  Pulse: 78 87 93 (!) 105  Resp: 16 (!) 22    Temp: 98.3 F (36.8 C) 98 F (36.7 C)    TempSrc:  Oral    SpO2: 97% 92% 95% 94%  Weight:      Height:        Wt Readings from Last 3 Encounters:  03/09/16 164  lb 11.2 oz (74.7 kg)  03/07/16 160 lb (72.6 kg)  02/29/16 176 lb 12.9 oz (80.2 kg)     Intake/Output Summary (Last 24 hours) at 03/11/16 1138 Last data filed at 03/11/16 0941  Gross per 24 hour  Intake              720 ml  Output              800 ml  Net              -80 ml    Physical Exam:   GENERAL: Pleasant-appearing in no apparent  distress.  HEAD, EYES, EARS, NOSE AND THROAT: Atraumatic, normocephalic. Extraocular muscles are intact. Pupils equal and reactive to light. Sclerae anicteric. No conjunctival injection. No oro-pharyngeal erythema.  NECK: Supple. There is no jugular venous distention. No bruits, no lymphadenopathy, no thyromegaly.  HEART: Regular rate and rhythm,. No murmurs, no rubs, no clicks.  LUNGS: Clear to auscultation bilaterally. No rales or rhonchi. No wheezes.  ABDOMEN: Soft, flat, nontender, nondistended. Has good bowel sounds. No hepatosplenomegaly appreciated.  EXTREMITIES: No evidence of any cyanosis, clubbing, or peripheral edema.  +2 pedal and radial pulses bilaterally.  NEUROLOGIC: The patient is alert, awake, and oriented x3 with no focal motor or sensory deficits appreciated bilaterally.  SKIN: Moist and warm with no rashes appreciated.  Psych: Not anxious, depressed LN: No inguinal LN enlargement    Antibiotics   Anti-infectives    Start     Dose/Rate Route Frequency Ordered Stop   03/10/16 1500  cefTRIAXone (ROCEPHIN) 1 g in dextrose 5 % 50 mL IVPB     1 g 120 mL/hr over 30 Minutes Intravenous Daily-1800 03/10/16 1341        Medications   Scheduled Meds: . alum & mag hydroxide-simeth  15 mL Oral BID  . amLODipine  5 mg Oral Daily  . apixaban  5 mg Oral BID  . atorvastatin  20 mg Oral QHS  . cefTRIAXone (ROCEPHIN)  IV  1 g Intravenous q1800  . feeding supplement (ENSURE ENLIVE)  237 mL Oral TID WC  . gabapentin  300 mg Oral QHS  . insulin aspart  0-5 Units Subcutaneous QHS  . insulin aspart  0-9 Units Subcutaneous TID WC  .  ondansetron  4 mg Oral BID  . pantoprazole  40 mg Oral BID   Continuous Infusions: PRN Meds:.acetaminophen, morphine injection, ondansetron (ZOFRAN) IV   Data Review:   Micro Results Recent Results (from the past 240 hour(s))  Blood Culture (routine x 2)     Status: None (Preliminary result)   Collection Time: 03/09/16  3:11 PM  Result Value Ref Range Status   Specimen Description BLOOD RIGHT AC  Final   Special Requests BOTTLES DRAWN AEROBIC AND ANAEROBIC BCAV  Final   Culture NO GROWTH 2 DAYS  Final   Report Status PENDING  Incomplete  Blood Culture (routine x 2)     Status: None (Preliminary result)   Collection Time: 03/09/16  3:40 PM  Result Value Ref Range Status   Specimen Description BLOOD RIGHT AC  Final   Special Requests BOTTLES DRAWN AEROBIC AND ANAEROBIC BCAV  Final   Culture NO GROWTH 2 DAYS  Final   Report Status PENDING  Incomplete  Body fluid culture     Status: None (Preliminary result)   Collection Time: 03/10/16 10:45 AM  Result Value Ref Range Status   Specimen Description PLEURAL  Final   Special Requests NONE  Final   Gram Stain   Final    ABUNDANT WBC PRESENT, PREDOMINANTLY MONONUCLEAR NO ORGANISMS SEEN    Culture   Final    NO GROWTH < 24 HOURS Performed at [facility], [address] [place], [place] [address]    Report Status PENDING  Incomplete    Radiology Reports Dg Chest 1 View  Result Date: 03/10/2016 CLINICAL DATA:  Status post right thoracentesis. EXAM: CHEST 1 VIEW COMPARISON:  03/09/2016 FINDINGS: No significant residual right pleural fluid volume after thoracentesis. No pneumothorax. Residual atelectasis remains in both lower lungs, right greater than left. No  pulmonary edema. The heart size is stable. IMPRESSION: No significant residual right pleural fluid after thoracentesis. No pneumothorax is seen. Both lower lobes demonstrate atelectasis, right greater than left. Electronically Signed   By: Aletta Edouard M.D.   On:  03/10/2016 11:18   Dg Chest 2 View  Result Date: 03/07/2016 CLINICAL DATA:  Hyperglycemia, dyspnea and weakness. Diabetes and hypertension. History of recent pulmonary embolus. EXAM: CHEST  2 VIEW COMPARISON:  February 26, 2016 chest CT and CXR FINDINGS: Low lung volumes are noted on current exam. Stable cardiomegaly with aortic atherosclerosis. Moderate right effusion with adjacent compressive atelectasis without significant change allowing for low lung volumes. It appears slightly greater on the frontal projection does smaller on the lateral since previous. Small left pleural effusion. No overt pulmonary edema or pneumothorax. ACDF of the cervical spine. Osteoarthritis of both glenohumeral and AC joints. Chronic stable degenerate change of the dorsal spine. IMPRESSION: 1. Low lung volumes on current exam with chronic moderate right and small left pleural effusions. Likely adjacent to compressive atelectasis due to the pleural effusions bilaterally. 2. Aortic atherosclerosis. Electronically Signed   By: Ashley Royalty M.D.   On: 03/07/2016 17:35   Dg Chest 2 View  Result Date: 02/26/2016 CLINICAL DATA:  Patient with weakness.  Shortness of breath. EXAM: CHEST  2 VIEW COMPARISON:  Chest radiograph 01/30/2016. FINDINGS: Anterior cervical spinal fusion hardware. Cardiomegaly. Monitoring leads overlie the patient. Patchy consolidation within the right mid and lower lung. Moderate right pleural effusion. Probable small left pleural effusion. IMPRESSION: Patchy consolidation within the right mid lower lung concerning for pneumonia in the appropriate clinical setting. Additional consideration would be atelectasis in the setting of pleural effusion. Moderate layering right pleural effusion. Probable small left pleural effusion. Electronically Signed   By: Lovey Newcomer M.D.   On: 02/26/2016 16:44   Ct Angio Chest Pe W Or Wo Contrast  Result Date: 03/09/2016 CLINICAL DATA:  Left-sided chest pain. Pain to palpation  along the left abdomen. EXAM: CT ANGIOGRAPHY CHEST CT ABDOMEN AND PELVIS WITH CONTRAST TECHNIQUE: Multidetector CT imaging of the chest was performed using the standard protocol during bolus administration of intravenous contrast. Multiplanar CT image reconstructions and MIPs were obtained to evaluate the vascular anatomy. Multidetector CT imaging of the abdomen and pelvis was performed using the standard protocol during bolus administration of intravenous contrast. CONTRAST:  75 cc Isovue 370 intravenous COMPARISON:  02/26/2016 FINDINGS: CTA CHEST FINDINGS Cardiovascular: Borderline cardiomegaly. Question small volume pericardial fluid posteriorly. Satisfactory opacification of the pulmonary arteries. Significant decrease in pulmonary artery clot that was seen previously. No web or new embolus identified. Normal right to left ventricle ratio. Atherosclerosis, including the coronary arteries. No acute aortic finding. Mediastinum/Nodes: Sliding hiatal hernia with prominent thickening an mucosal hyperenhancement of the lower esophagus. Superiorly the esophagus is patulous. Lungs/Pleura: Moderate to large right and small left pleural effusions with multi segment atelectasis. No edema or consolidation. Musculoskeletal: No acute or aggressive finding Review of the MIP images confirms the above findings. CT ABDOMEN and PELVIS FINDINGS Hepatobiliary: No focal liver abnormality.No evidence of biliary obstruction or stone. Pancreas: Atrophic appearance of the pancreas tail without evidence of mass or ductal dilatation. Spleen: Unremarkable. Adrenals/Urinary Tract: Negative adrenals. Mass within the collecting system of of the upper and interpolar right kidney with speckled calcifications. The mass is somewhat low-density, but still primarily concerning for urothelial carcinoma. Given low-density debris/chronic hemorrhage is also considered. No related obstruction. No high-density clot. Mild hydronephrosis on the left. The  urinary bladder is markedly distended. Stomach/Bowel: No obstruction. No inflammatory changes. Hiatal hernia described above. Vascular/Lymphatic: No acute vascular abnormality. No mass or adenopathy. Reproductive:Negative Other: No ascites or pneumoperitoneum. Musculoskeletal: Remote L3 compression fracture. Lower lumbar degenerative disc narrowing. Review of the MIP images confirms the above findings. IMPRESSION: 1. Excellent clearing of previously seen pulmonary emboli. No interval embolus identified. 2. Large right and small left pleural effusions are stable from 02/26/2016. Multi segment atelectasis. 3. Intrarenal and renal pelvis collecting system mass on the right. Recommend workup for urothelial carcinoma. 4. Lower esophageal thickening and mucosal hyper enhancement, likely reflux esophagitis in the setting of hiatal hernia. Follow-up is recommended. 5. Over distended urinary bladder. Electronically Signed   By: Monte Fantasia M.D.   On: 03/09/2016 15:04   Ct Angio Chest Pe W And/or Wo Contrast  Result Date: 02/26/2016 CLINICAL DATA:  Patient with shortness of breath. Irregular heart rate. EXAM: CT ANGIOGRAPHY CHEST WITH CONTRAST TECHNIQUE: Multidetector CT imaging of the chest was performed using the standard protocol during bolus administration of intravenous contrast. Multiplanar CT image reconstructions and MIPs were obtained to evaluate the vascular anatomy. CONTRAST:  75 cc Isovue 370 COMPARISON:  Chest radiograph 02/26/2016 FINDINGS: Cardiovascular: Heart is enlarged. Small pericardial effusion. Aorta and main pulmonary artery are normal in caliber. Filling defects are identified within the right lower lobe pulmonary arteries as well as left upper and left lower lobe pulmonary arteries. No CT evidence to suggest acute right heart strain. Coronary arterial vascular calcifications. Mediastinum/Nodes: No enlarged axillary, mediastinal or hilar lymphadenopathy. There is circumferential wall thickening  of the mid and distal esophagus. Small hiatal hernia. Lungs/Pleura: Central airways are patent. Atelectasis of the right middle lobe. Consolidation within the right and left lower lobes. Moderate right and small left layering pleural effusions. No pneumothorax. Upper Abdomen: Liver is diffusely low in attenuation compatible with steatosis. Adrenal glands are normal. Musculoskeletal: Mid thoracic spine degenerative changes. No aggressive or acute appearing osseous lesions. Review of the MIP images confirms the above findings. IMPRESSION: Findings compatible with acute pulmonary embolus within the right lower lobe, left upper lobe and left lower lobe pulmonary arteries. No evidence for right heart strain. There is circumferential wall thickening of the mid and distal esophagus. Acute esophagitis is not excluded. Small hiatal hernia. Consolidation of the right middle lobe with subpleural consolidation in the right lower and left lower lobes which may represent atelectasis. Infection not excluded. Moderate right and small left layering pleural effusions. Hepatic steatosis. Aortic atherosclerosis. Critical Value/emergent results were called by telephone at the time of interpretation on 02/26/2016 at 7:22 pm to Dr. Dorie Rank , who verbally acknowledged these results. Electronically Signed   By: Lovey Newcomer M.D.   On: 02/26/2016 19:23   Ct Abdomen Pelvis W Contrast  Result Date: 03/09/2016 CLINICAL DATA:  Left-sided chest pain. Pain to palpation along the left abdomen. EXAM: CT ANGIOGRAPHY CHEST CT ABDOMEN AND PELVIS WITH CONTRAST TECHNIQUE: Multidetector CT imaging of the chest was performed using the standard protocol during bolus administration of intravenous contrast. Multiplanar CT image reconstructions and MIPs were obtained to evaluate the vascular anatomy. Multidetector CT imaging of the abdomen and pelvis was performed using the standard protocol during bolus administration of intravenous contrast. CONTRAST:  75  cc Isovue 370 intravenous COMPARISON:  02/26/2016 FINDINGS: CTA CHEST FINDINGS Cardiovascular: Borderline cardiomegaly. Question small volume pericardial fluid posteriorly. Satisfactory opacification of the pulmonary arteries. Significant decrease in pulmonary artery clot that was seen previously. No web or new  embolus identified. Normal right to left ventricle ratio. Atherosclerosis, including the coronary arteries. No acute aortic finding. Mediastinum/Nodes: Sliding hiatal hernia with prominent thickening an mucosal hyperenhancement of the lower esophagus. Superiorly the esophagus is patulous. Lungs/Pleura: Moderate to large right and small left pleural effusions with multi segment atelectasis. No edema or consolidation. Musculoskeletal: No acute or aggressive finding Review of the MIP images confirms the above findings. CT ABDOMEN and PELVIS FINDINGS Hepatobiliary: No focal liver abnormality.No evidence of biliary obstruction or stone. Pancreas: Atrophic appearance of the pancreas tail without evidence of mass or ductal dilatation. Spleen: Unremarkable. Adrenals/Urinary Tract: Negative adrenals. Mass within the collecting system of of the upper and interpolar right kidney with speckled calcifications. The mass is somewhat low-density, but still primarily concerning for urothelial carcinoma. Given low-density debris/chronic hemorrhage is also considered. No related obstruction. No high-density clot. Mild hydronephrosis on the left. The urinary bladder is markedly distended. Stomach/Bowel: No obstruction. No inflammatory changes. Hiatal hernia described above. Vascular/Lymphatic: No acute vascular abnormality. No mass or adenopathy. Reproductive:Negative Other: No ascites or pneumoperitoneum. Musculoskeletal: Remote L3 compression fracture. Lower lumbar degenerative disc narrowing. Review of the MIP images confirms the above findings. IMPRESSION: 1. Excellent clearing of previously seen pulmonary emboli. No  interval embolus identified. 2. Large right and small left pleural effusions are stable from 02/26/2016. Multi segment atelectasis. 3. Intrarenal and renal pelvis collecting system mass on the right. Recommend workup for urothelial carcinoma. 4. Lower esophageal thickening and mucosal hyper enhancement, likely reflux esophagitis in the setting of hiatal hernia. Follow-up is recommended. 5. Over distended urinary bladder. Electronically Signed   By: Monte Fantasia M.D.   On: 03/09/2016 15:04   US Venous Img Lower Bilateral  Result Date: 02/27/2016 CLINICAL DATA:  Pulmonary thromboembolism yesterday EXAM: BILATERAL LOWER EXTREMITY VENOUS DUPLEX ULTRASOUND TECHNIQUE: Doppler venous assessment of the left lower extremity deep venous system was performed, including characterization of spectral flow, compressibility, and phasicity. COMPARISON:  None. FINDINGS: There is partially occlusive thrombus in the right common femoral vein. The femoral and popliteal veins are compressible and are without thrombus. Doppler analysis demonstrates respiratory phasicity in the deep venous system of the right lower extremity. In the left lower extremity, there is complete compressibility of the left common femoral, femoral, and popliteal veins. There is partially occlusive thrombus within the posterior tibial vein of the left calf. There is also superficial vein thrombus in the left lesser saphenous vein. IMPRESSION: The study is positive for DVT. There is partially occlusive thrombus in the right common femoral vein and left posterior tibial veins. The femoral and popliteal veins are patent bilaterally. Electronically Signed   By: Marybelle Killings M.D.   On: 02/27/2016 14:40   Dg Chest Portable 1 View  Result Date: 03/09/2016 CLINICAL DATA:  Shortness of breath and chest pain EXAM: PORTABLE CHEST 1 VIEW COMPARISON:  March 07, 2016 FINDINGS: There is partially loculated pleural effusion on the right. There is patchy atelectasis in  the right mid lung and right base regions. There is no demonstrable airspace consolidation currently in this area. On the left, there is slight left base atelectasis. Left lung otherwise clear. Heart is mildly enlarged with pulmonary vascularity within normal limits. There is atherosclerotic calcification in the aorta. There is no evident adenopathy. There is atherosclerotic calcification in both carotid arteries. There is evidence of old trauma involving the acromioclavicular joint with separation in this area, chronic. Bones are osteoporotic. Postoperative changes noted in the lower cervical spine. IMPRESSION: Partially loculated right pleural  effusion with patchy atelectasis in the right mid and lower lung zones. No frank consolidation currently in the right base. Mild left base atelectasis. Stable cardiomegaly with pulmonary vascularity within normal limits. Bones osteoporotic. Chronic acromioclavicular separation on the right. Extensive carotid artery calcification bilaterally noted. There is also aortic atherosclerosis. Electronically Signed   By: Lowella Grip III M.D.   On: 03/09/2016 14:18   US Thoracentesis Asp Pleural Space W/img Guide  Result Date: 03/10/2016 INDICATION: Chest pain and right pleural effusion on recent imaging. EXAM: ULTRASOUND GUIDED RIGHT THORACENTESIS MEDICATIONS: None. COMPLICATIONS: None immediate. PROCEDURE: An ultrasound guided thoracentesis was thoroughly discussed with the patient and questions answered. The benefits, risks, alternatives and complications were also discussed. The patient understands and wishes to proceed with the procedure. Written consent was obtained. Ultrasound was performed to localize and mark an adequate pocket of fluid in the right chest. The area was then prepped and draped in the normal sterile fashion. 1% Lidocaine was used for local anesthesia. Under ultrasound guidance a 6 Fr Safe-T-Centesis catheter was introduced. Thoracentesis was performed.  The catheter was removed and a dressing applied. FINDINGS: A total of approximately 410 mL of yellow fluid was removed. Samples were sent to the laboratory as requested by the clinical team. IMPRESSION: Successful ultrasound guided right thoracentesis yielding 410 mL of pleural fluid. Electronically Signed   By: Markus Daft M.D.   On: 03/10/2016 11:30     CBC  Recent Labs Lab 03/07/16 1559 03/09/16 1324  WBC 6.2 5.2  HGB 10.2* 9.5*  HCT 31.2* 27.1*  PLT 256 293  MCV 86.7 83.6  MCH 28.3 29.2  MCHC 32.7 34.9  RDW 14.5 15.2*  LYMPHSABS 2.0  --   MONOABS 0.4  --   EOSABS 0.0  --   BASOSABS 0.0  --     Chemistries   Recent Labs Lab 03/07/16 1656 03/09/16 1324 03/10/16 0805  NA 132* 134* 135  K 3.3* 3.7 3.8  CL 87* 89* 92*  CO2 36* 35* 34*  GLUCOSE 240* 227* 132*  BUN 25* 32* 31*  CREATININE 1.26* 1.50* 1.42*  CALCIUM 8.1* 8.1* 7.8*  AST 16 14*  --   ALT 12* 10*  --   ALKPHOS 64 68  --   BILITOT 0.5 0.9  --    ------------------------------------------------------------------------------------------------------------------ estimated creatinine clearance is 28.1 mL/min (by C-G formula based on SCr of 1.42 mg/dL (H)). ------------------------------------------------------------------------------------------------------------------ No results for input(s): HGBA1C in the last 72 hours. ------------------------------------------------------------------------------------------------------------------ No results for input(s): CHOL, HDL, LDLCALC, TRIG, CHOLHDL, LDLDIRECT in the last 72 hours. ------------------------------------------------------------------------------------------------------------------ No results for input(s): TSH, T4TOTAL, T3FREE, THYROIDAB in the last 72 hours.  Invalid input(s): FREET3 ------------------------------------------------------------------------------------------------------------------ No results for input(s): VITAMINB12, FOLATE, FERRITIN,  TIBC, IRON, RETICCTPCT in the last 72 hours.  Coagulation profile  Recent Labs Lab 03/09/16 1324  INR 1.83    No results for input(s): DDIMER in the last 72 hours.  Cardiac Enzymes  Recent Labs Lab 03/09/16 1756 03/09/16 2040 03/09/16 2355  TROPONINI <0.03 <0.03 <0.03   ------------------------------------------------------------------------------------------------------------------ Invalid input(s): POCBNP    Assessment & Plan   Sherry [name]  is a 83 y.o. female with a known history of Diabetes mellitus, hypertension, hyper lipidemia recent history of pulmonary embolism diagnosed on 02/26/2016 at [facility], currently on eliquis is coming to the ED via EMS with a chief complaint of chest pain. EKG with a left bundle branch block which is old when compared to the previous records. Initial troponin is negative.  Patient is a poor historian from chronic dementia.    #Chest pain  Cardiac enzymes are negative Seen by cardiology no further workup needed   Possible noncardiac chest pain  related to esophagitisand tingling  PPIs twice a day  #Urinary retention I will start her on some Flomax, and we'll do bladder scan if greater than 300 cc again will need Foley  #Large right-sided pleural effusion etiology unclear Status post drainageCytology pending  # H/o PE Pt is on Eliquis  CT shows less pulmonary artery burden   #Possible ureteral cancer cytology and the urine pending  # DM  Holding metformin as she just had CT angiogram with contrast Per diabetes coordinator stop glipizide Sliding scale insulin  #Chronic kidney disease Creatinine at 1.5 seems to be at her baseline Continue close monitoring and avoid nephrotoxins  #UTI and tinea IV ceftriaxone and await cultures  DVT prophylaxis on eliquis     Code Status Orders        Start     Ordered   03/09/16 1634  Do not attempt resuscitation (DNR)  Continuous    Question Answer Comment  In  the event of cardiac or respiratory ARREST Do not call a "code blue"   In the event of cardiac or respiratory ARREST Do not perform Intubation, CPR, defibrillation or ACLS   In the event of cardiac or respiratory ARREST Use medication by any route, position, wound care, and other measures to relive pain and suffering. May use oxygen, suction and manual treatment of airway obstruction as needed for comfort.   Comments RN may pronounce      03/09/16 1633    Code Status History    Date Active Date Inactive Code Status Order ID Comments User Context   02/26/2016  9:39 PM 03/01/2016 10:37 PM DNR [id number]  Karmen Bongo, MD Inpatient   02/26/2016  7:48 PM 02/26/2016  9:39 PM DNR [id number]  Dorie Rank, MD ED   01/31/2016  5:45 PM 02/07/2016 11:45 PM DNR [id number]  Flora Lipps, MD Inpatient   01/30/2016 11:19 PM 01/31/2016  5:44 PM Full Code [id number]  Harvie Bridge, DO Inpatient   11/26/2014  3:52 PM 11/28/2014  3:07 PM Full Code [id number]  Max Sane, MD Inpatient   11/26/2014  8:59 AM 11/26/2014  3:52 PM Full Code [id number]  Max Sane, MD ED    Advance Directive Documentation   Flowsheet Row Most Recent Value  Type of Advance Directive  Living will  Pre-existing out of facility DNR order (yellow form or pink MOST form)  No data  "MOST" Form in Place?  No data           Consults  cardiology   DVT Prophylaxis  eliquis  Lab Results  Component Value Date   PLT 293 03/09/2016     Time Spent in minutes  51min  Greater than 50% of time spent in care coordination and counseling patient regarding the condition and plan of care.   Dustin Flock M.D on 03/11/2016 at 11:38 AM  Between 7am to 6pm - Pager - [phone]  After 6pm go to [email or web address] - password [email or web address] [facility]   Office  [phone]

## 2016-03-11 NOTE — Clinical Social Work Note (Signed)
CSW received consult that PT recommends SNF. CSW will assess when able.  Mahnoor Mathisen Martha Elizandro Laura, MSW, LCSWA [phone] 

## 2016-03-11 NOTE — Progress Notes (Signed)
[facility] Cardiology  SUBJECTIVE: I don't have any chest pain   Vitals:   03/11/16 0442 03/11/16 0914 03/11/16 0921 03/11/16 0923  BP: (!) 106/55 (!) 123/39 (!) 116/37 (!) 157/76  Pulse: 78 87 93 (!) 105  Resp: 16 (!) 22    Temp: 98.3 F (36.8 C) 98 F (36.7 C)    TempSrc:  Oral    SpO2: 97% 92% 95% 94%  Weight:      Height:         Intake/Output Summary (Last 24 hours) at 03/11/16 1123 Last data filed at 03/11/16 0941  Gross per 24 hour  Intake              720 ml  Output              800 ml  Net              -80 ml      PHYSICAL EXAM  General: Well developed, well nourished, in no acute distress HEENT:  Normocephalic and atramatic Neck:  No JVD.  Lungs: Clear bilaterally to auscultation and percussion. Heart: HRRR . Normal S1 and S2 without gallops or murmurs.  Abdomen: Bowel sounds are positive, abdomen soft and non-tender  Msk:  Back normal, normal gait. Normal strength and tone for age. Extremities: No clubbing, cyanosis or edema.   Neuro: Alert and oriented X 3. Psych:  Good affect, responds appropriately   LABS: Basic Metabolic Panel:  Recent Labs  03/09/16 1324 03/10/16 0805  NA 134* 135  K 3.7 3.8  CL 89* 92*  CO2 35* 34*  GLUCOSE 227* 132*  BUN 32* 31*  CREATININE 1.50* 1.42*  CALCIUM 8.1* 7.8*   Liver Function Tests:  Recent Labs  03/09/16 1324  AST 14*  ALT 10*  ALKPHOS 68  BILITOT 0.9  PROT 5.5*  ALBUMIN 2.1*   No results for input(s): LIPASE, AMYLASE in the last 72 hours. CBC:  Recent Labs  03/09/16 1324  WBC 5.2  HGB 9.5*  HCT 27.1*  MCV 83.6  PLT 293   Cardiac Enzymes:  Recent Labs  03/09/16 1756 03/09/16 2040 03/09/16 2355  TROPONINI <0.03 <0.03 <0.03   BNP: Invalid input(s): POCBNP D-Dimer: No results for input(s): DDIMER in the last 72 hours. Hemoglobin A1C: No results for input(s): HGBA1C in the last 72 hours. Fasting Lipid Panel: No results for input(s): CHOL, HDL, LDLCALC, TRIG, CHOLHDL, LDLDIRECT in  the last 72 hours. Thyroid Function Tests: No results for input(s): TSH, T4TOTAL, T3FREE, THYROIDAB in the last 72 hours.  Invalid input(s): FREET3 Anemia Panel: No results for input(s): VITAMINB12, FOLATE, FERRITIN, TIBC, IRON, RETICCTPCT in the last 72 hours.  Dg Chest 1 View  Result Date: 03/10/2016 CLINICAL DATA:  Status post right thoracentesis. EXAM: CHEST 1 VIEW COMPARISON:  03/09/2016 FINDINGS: No significant residual right pleural fluid volume after thoracentesis. No pneumothorax. Residual atelectasis remains in both lower lungs, right greater than left. No pulmonary edema. The heart size is stable. IMPRESSION: No significant residual right pleural fluid after thoracentesis. No pneumothorax is seen. Both lower lobes demonstrate atelectasis, right greater than left. Electronically Signed   By: Aletta Edouard M.D.   On: 03/10/2016 11:18   Ct Angio Chest Pe W Or Wo Contrast  Result Date: 03/09/2016 CLINICAL DATA:  Left-sided chest pain. Pain to palpation along the left abdomen. EXAM: CT ANGIOGRAPHY CHEST CT ABDOMEN AND PELVIS WITH CONTRAST TECHNIQUE: Multidetector CT imaging of the chest was performed using the standard protocol during  bolus administration of intravenous contrast. Multiplanar CT image reconstructions and MIPs were obtained to evaluate the vascular anatomy. Multidetector CT imaging of the abdomen and pelvis was performed using the standard protocol during bolus administration of intravenous contrast. CONTRAST:  75 cc Isovue 370 intravenous COMPARISON:  02/26/2016 FINDINGS: CTA CHEST FINDINGS Cardiovascular: Borderline cardiomegaly. Question small volume pericardial fluid posteriorly. Satisfactory opacification of the pulmonary arteries. Significant decrease in pulmonary artery clot that was seen previously. No web or new embolus identified. Normal right to left ventricle ratio. Atherosclerosis, including the coronary arteries. No acute aortic finding. Mediastinum/Nodes: Sliding  hiatal hernia with prominent thickening an mucosal hyperenhancement of the lower esophagus. Superiorly the esophagus is patulous. Lungs/Pleura: Moderate to large right and small left pleural effusions with multi segment atelectasis. No edema or consolidation. Musculoskeletal: No acute or aggressive finding Review of the MIP images confirms the above findings. CT ABDOMEN and PELVIS FINDINGS Hepatobiliary: No focal liver abnormality.No evidence of biliary obstruction or stone. Pancreas: Atrophic appearance of the pancreas tail without evidence of mass or ductal dilatation. Spleen: Unremarkable. Adrenals/Urinary Tract: Negative adrenals. Mass within the collecting system of of the upper and interpolar right kidney with speckled calcifications. The mass is somewhat low-density, but still primarily concerning for urothelial carcinoma. Given low-density debris/chronic hemorrhage is also considered. No related obstruction. No high-density clot. Mild hydronephrosis on the left. The urinary bladder is markedly distended. Stomach/Bowel: No obstruction. No inflammatory changes. Hiatal hernia described above. Vascular/Lymphatic: No acute vascular abnormality. No mass or adenopathy. Reproductive:Negative Other: No ascites or pneumoperitoneum. Musculoskeletal: Remote L3 compression fracture. Lower lumbar degenerative disc narrowing. Review of the MIP images confirms the above findings. IMPRESSION: 1. Excellent clearing of previously seen pulmonary emboli. No interval embolus identified. 2. Large right and small left pleural effusions are stable from 02/26/2016. Multi segment atelectasis. 3. Intrarenal and renal pelvis collecting system mass on the right. Recommend workup for urothelial carcinoma. 4. Lower esophageal thickening and mucosal hyper enhancement, likely reflux esophagitis in the setting of hiatal hernia. Follow-up is recommended. 5. Over distended urinary bladder. Electronically Signed   By: Monte Fantasia M.D.   On:  03/09/2016 15:04   Ct Abdomen Pelvis W Contrast  Result Date: 03/09/2016 CLINICAL DATA:  Left-sided chest pain. Pain to palpation along the left abdomen. EXAM: CT ANGIOGRAPHY CHEST CT ABDOMEN AND PELVIS WITH CONTRAST TECHNIQUE: Multidetector CT imaging of the chest was performed using the standard protocol during bolus administration of intravenous contrast. Multiplanar CT image reconstructions and MIPs were obtained to evaluate the vascular anatomy. Multidetector CT imaging of the abdomen and pelvis was performed using the standard protocol during bolus administration of intravenous contrast. CONTRAST:  75 cc Isovue 370 intravenous COMPARISON:  02/26/2016 FINDINGS: CTA CHEST FINDINGS Cardiovascular: Borderline cardiomegaly. Question small volume pericardial fluid posteriorly. Satisfactory opacification of the pulmonary arteries. Significant decrease in pulmonary artery clot that was seen previously. No web or new embolus identified. Normal right to left ventricle ratio. Atherosclerosis, including the coronary arteries. No acute aortic finding. Mediastinum/Nodes: Sliding hiatal hernia with prominent thickening an mucosal hyperenhancement of the lower esophagus. Superiorly the esophagus is patulous. Lungs/Pleura: Moderate to large right and small left pleural effusions with multi segment atelectasis. No edema or consolidation. Musculoskeletal: No acute or aggressive finding Review of the MIP images confirms the above findings. CT ABDOMEN and PELVIS FINDINGS Hepatobiliary: No focal liver abnormality.No evidence of biliary obstruction or stone. Pancreas: Atrophic appearance of the pancreas tail without evidence of mass or ductal dilatation. Spleen: Unremarkable. Adrenals/Urinary Tract: Negative  adrenals. Mass within the collecting system of of the upper and interpolar right kidney with speckled calcifications. The mass is somewhat low-density, but still primarily concerning for urothelial carcinoma. Given  low-density debris/chronic hemorrhage is also considered. No related obstruction. No high-density clot. Mild hydronephrosis on the left. The urinary bladder is markedly distended. Stomach/Bowel: No obstruction. No inflammatory changes. Hiatal hernia described above. Vascular/Lymphatic: No acute vascular abnormality. No mass or adenopathy. Reproductive:Negative Other: No ascites or pneumoperitoneum. Musculoskeletal: Remote L3 compression fracture. Lower lumbar degenerative disc narrowing. Review of the MIP images confirms the above findings. IMPRESSION: 1. Excellent clearing of previously seen pulmonary emboli. No interval embolus identified. 2. Large right and small left pleural effusions are stable from 02/26/2016. Multi segment atelectasis. 3. Intrarenal and renal pelvis collecting system mass on the right. Recommend workup for urothelial carcinoma. 4. Lower esophageal thickening and mucosal hyper enhancement, likely reflux esophagitis in the setting of hiatal hernia. Follow-up is recommended. 5. Over distended urinary bladder. Electronically Signed   By: Monte Fantasia M.D.   On: 03/09/2016 15:04   Dg Chest Portable 1 View  Result Date: 03/09/2016 CLINICAL DATA:  Shortness of breath and chest pain EXAM: PORTABLE CHEST 1 VIEW COMPARISON:  March 07, 2016 FINDINGS: There is partially loculated pleural effusion on the right. There is patchy atelectasis in the right mid lung and right base regions. There is no demonstrable airspace consolidation currently in this area. On the left, there is slight left base atelectasis. Left lung otherwise clear. Heart is mildly enlarged with pulmonary vascularity within normal limits. There is atherosclerotic calcification in the aorta. There is no evident adenopathy. There is atherosclerotic calcification in both carotid arteries. There is evidence of old trauma involving the acromioclavicular joint with separation in this area, chronic. Bones are osteoporotic. Postoperative  changes noted in the lower cervical spine. IMPRESSION: Partially loculated right pleural effusion with patchy atelectasis in the right mid and lower lung zones. No frank consolidation currently in the right base. Mild left base atelectasis. Stable cardiomegaly with pulmonary vascularity within normal limits. Bones osteoporotic. Chronic acromioclavicular separation on the right. Extensive carotid artery calcification bilaterally noted. There is also aortic atherosclerosis. Electronically Signed   By: Lowella Grip III M.D.   On: 03/09/2016 14:18   US Thoracentesis Asp Pleural Space W/img Guide  Result Date: 03/10/2016 INDICATION: Chest pain and right pleural effusion on recent imaging. EXAM: ULTRASOUND GUIDED RIGHT THORACENTESIS MEDICATIONS: None. COMPLICATIONS: None immediate. PROCEDURE: An ultrasound guided thoracentesis was thoroughly discussed with the patient and questions answered. The benefits, risks, alternatives and complications were also discussed. The patient understands and wishes to proceed with the procedure. Written consent was obtained. Ultrasound was performed to localize and mark an adequate pocket of fluid in the right chest. The area was then prepped and draped in the normal sterile fashion. 1% Lidocaine was used for local anesthesia. Under ultrasound guidance a 6 Fr Safe-T-Centesis catheter was introduced. Thoracentesis was performed. The catheter was removed and a dressing applied. FINDINGS: A total of approximately 410 mL of yellow fluid was removed. Samples were sent to the laboratory as requested by the clinical team. IMPRESSION: Successful ultrasound guided right thoracentesis yielding 410 mL of pleural fluid. Electronically Signed   By: Markus Daft M.D.   On: 03/10/2016 11:30     Echo LV EF 45-50% by echocardiogram 02/29/2016  TELEMETRY: Sinus rhythm:  ASSESSMENT AND PLAN:  Active Problems:   Chest pain    1. Chest pain, with atypical features, with left bundle  branch  block on ECG, status post recent right thoracentesis of large pleural effusion 2. Moderately reduced left ventricular function 3. Pulmonary embolus, on Eliquis  Recommendations  1. Continue current therapy 2. No further cardiac diagnostics at this time  Signed off for now, please call if any questions   Isaias Cowman, MD, PhD, [facility] 03/11/2016 11:23 AM

## 2016-03-11 NOTE — Progress Notes (Signed)
Bladder scanned pt with >300. Notified MD. In and out cath. Will continue to monitor and assess.

## 2016-03-12 DIAGNOSIS — Z66 Do not resuscitate: Secondary | ICD-10-CM | POA: Diagnosis present

## 2016-03-12 DIAGNOSIS — N189 Chronic kidney disease, unspecified: Secondary | ICD-10-CM | POA: Diagnosis present

## 2016-03-12 DIAGNOSIS — N2889 Other specified disorders of kidney and ureter: Secondary | ICD-10-CM | POA: Diagnosis present

## 2016-03-12 DIAGNOSIS — E1122 Type 2 diabetes mellitus with diabetic chronic kidney disease: Secondary | ICD-10-CM | POA: Diagnosis present

## 2016-03-12 DIAGNOSIS — J90 Pleural effusion, not elsewhere classified: Secondary | ICD-10-CM | POA: Diagnosis present

## 2016-03-12 DIAGNOSIS — Z794 Long term (current) use of insulin: Secondary | ICD-10-CM | POA: Diagnosis not present

## 2016-03-12 DIAGNOSIS — B9620 Unspecified Escherichia coli [E. coli] as the cause of diseases classified elsewhere: Secondary | ICD-10-CM | POA: Diagnosis present

## 2016-03-12 DIAGNOSIS — R079 Chest pain, unspecified: Secondary | ICD-10-CM | POA: Diagnosis present

## 2016-03-12 DIAGNOSIS — I447 Left bundle-branch block, unspecified: Secondary | ICD-10-CM | POA: Diagnosis present

## 2016-03-12 DIAGNOSIS — E785 Hyperlipidemia, unspecified: Secondary | ICD-10-CM | POA: Diagnosis present

## 2016-03-12 DIAGNOSIS — Z515 Encounter for palliative care: Secondary | ICD-10-CM | POA: Diagnosis not present

## 2016-03-12 DIAGNOSIS — Z79899 Other long term (current) drug therapy: Secondary | ICD-10-CM | POA: Diagnosis not present

## 2016-03-12 DIAGNOSIS — N390 Urinary tract infection, site not specified: Secondary | ICD-10-CM | POA: Diagnosis present

## 2016-03-12 DIAGNOSIS — Z1619 Resistance to other specified beta lactam antibiotics: Secondary | ICD-10-CM | POA: Diagnosis present

## 2016-03-12 DIAGNOSIS — R627 Adult failure to thrive: Secondary | ICD-10-CM | POA: Diagnosis not present

## 2016-03-12 DIAGNOSIS — Z7189 Other specified counseling: Secondary | ICD-10-CM | POA: Diagnosis not present

## 2016-03-12 DIAGNOSIS — Z86711 Personal history of pulmonary embolism: Secondary | ICD-10-CM | POA: Diagnosis not present

## 2016-03-12 DIAGNOSIS — Z7901 Long term (current) use of anticoagulants: Secondary | ICD-10-CM | POA: Diagnosis not present

## 2016-03-12 DIAGNOSIS — F0390 Unspecified dementia without behavioral disturbance: Secondary | ICD-10-CM | POA: Diagnosis present

## 2016-03-12 DIAGNOSIS — C651 Malignant neoplasm of right renal pelvis: Secondary | ICD-10-CM | POA: Diagnosis present

## 2016-03-12 DIAGNOSIS — I129 Hypertensive chronic kidney disease with stage 1 through stage 4 chronic kidney disease, or unspecified chronic kidney disease: Secondary | ICD-10-CM | POA: Diagnosis present

## 2016-03-12 LAB — URINE CULTURE: Culture: >=100000 — AB

## 2016-03-12 LAB — GLUCOSE, CAPILLARY
GLUCOSE-CAPILLARY: 229 mg/dL — AB (ref 65–99)
GLUCOSE-CAPILLARY: 181 mg/dL — AB (ref 65–99)
Glucose-Capillary: 134 mg/dL — ABNORMAL HIGH (ref 65–99)
Glucose-Capillary: 197 mg/dL — ABNORMAL HIGH (ref 65–99)

## 2016-03-12 MED ORDER — MEROPENEM-SODIUM CHLORIDE 500 MG/50ML IV SOLR
500.0000 mg | Freq: Two times a day (BID) | INTRAVENOUS | Status: DC
  Administered 2016-03-12 (×2): 500 mg via INTRAVENOUS
  Filled 2016-03-12 (×4): qty 50

## 2016-03-12 NOTE — Progress Notes (Signed)
Pharmacy Antibiotic Note  Sherry [name] is a 83 y.o. female with a h/o recent PE, CKD, and DM, admitted on 03/09/2016 with  chest pain. Pharmacy has been consulted for meropenem dosing for UTI. Patient previously on ceftriaxone, but urine culture resulting with Enterobacter species resistant to ceftriaxone. Organism is sensitive to Septra, but Dr. Posey Pronto wants to avoid nephrotoxins.   Plan: Meropenem 500 mg iv q 12 hours.   Height: 5\' 4"  (162.6 cm) Weight: 164 lb 11.2 oz (74.7 kg) IBW/kg (Calculated) : 54.7  Temp (24hrs), Avg:98.2 F (36.8 C), Min:97.7 F (36.5 C), Max:98.5 F (36.9 C)   Recent Labs Lab 03/07/16 1559 03/07/16 1656 03/09/16 1324 03/09/16 1511 03/09/16 1756 03/10/16 0805  WBC 6.2  --  5.2  --   --   --   CREATININE  --  1.26* 1.50*  --   --  1.42*  LATICACIDVEN  --   --   --  1.0 1.1  --     Estimated Creatinine Clearance: 28.1 mL/min (by C-G formula based on SCr of 1.42 mg/dL (H)).    No Known Allergies  Antimicrobials this admission: CTX 3/2 >> 3/4 meropenem 3/4 >>   Dose adjustments this admission:   Microbiology results: 3/1 BCx: NGTD 3/2 UCx: Enterobacter  3/2 Pleural fluid cx: NGTD  Thank you for allowing pharmacy to be a part of this patient's care.  Ulice Dash D 03/12/2016 9:19 AM

## 2016-03-12 NOTE — Progress Notes (Addendum)
Sherry [name] at [facility]                                                                                                                                                                                  Patient Demographics   Sherry [name], is a 83 y.o. female, DOB - 18-Jun-1927, [id number]  Admit date - 03/09/2016   Admitting Physician Nicholes Mango, MD  Outpatient Primary MD for the patient is [facility]   LOS - 0  Subjective: No further chest pain Patient noted to have urinary retention and had a Foley placement  Review of Systems:   CONSTITUTIONAL: No documented fever. Positive  fatigue, weakness. No weight gain, no weight loss.  EYES: No blurry or double vision.  ENT: No tinnitus. No postnasal drip. No redness of the oropharynx.  RESPIRATORY: No cough, no wheeze, no hemoptysis. No dyspnea.  CARDIOVASCULAR: Resolved chest pain. No orthopnea. No palpitations. No syncope.  GASTROINTESTINAL: No nausea, no vomiting or diarrhea. No abdominal pain. No melena or hematochezia.  GENITOURINARY: No dysuria or hematuria.  ENDOCRINE: No polyuria or nocturia. No heat or cold intolerance.  HEMATOLOGY: No anemia. No bruising. No bleeding.  INTEGUMENTARY: No rashes. No lesions.  MUSCULOSKELETAL: No arthritis. No swelling. No gout.  NEUROLOGIC: No numbness, tingling, or ataxia. No seizure-type activity.  PSYCHIATRIC: No anxiety. No insomnia. No ADD.    Vitals:   Vitals:   03/11/16 1959 03/12/16 0447 03/12/16 0754 03/12/16 1128  BP: (!) 120/34 (!) 129/44 (!) 135/40 (!) 129/43  Pulse: 78 82 82 81  Resp: 16 16  19   Temp: 98.4 F (36.9 C) 98.5 F (36.9 C) 98.3 F (36.8 C) 98.1 F (36.7 C)  TempSrc: Oral Oral Oral Oral  SpO2:  99% 99% 99%  Weight:      Height:        Wt Readings from Last 3 Encounters:  03/09/16 164 lb 11.2 oz (74.7 kg)  03/07/16 160 lb (72.6 kg)  02/29/16 176 lb 12.9 oz (80.2 kg)     Intake/Output Summary (Last 24 hours)  at 03/12/16 1221 Last data filed at 03/12/16 0900  Gross per 24 hour  Intake              890 ml  Output              875 ml  Net               15 ml    Physical Exam:   GENERAL: Pleasant-appearing in no apparent distress.  HEAD, EYES, EARS, NOSE AND THROAT: Atraumatic, normocephalic. Extraocular muscles are intact. Pupils equal and reactive to light. Sclerae anicteric.  No conjunctival injection. No oro-pharyngeal erythema.  NECK: Supple. There is no jugular venous distention. No bruits, no lymphadenopathy, no thyromegaly.  HEART: Regular rate and rhythm,. No murmurs, no rubs, no clicks.  LUNGS: Clear to auscultation bilaterally. No rales or rhonchi. No wheezes.  ABDOMEN: Soft, flat, nontender, nondistended. Has good bowel sounds. No hepatosplenomegaly appreciated.  EXTREMITIES: No evidence of any cyanosis, clubbing, or peripheral edema.  +2 pedal and radial pulses bilaterally.  NEUROLOGIC: The patient is alert, awake, and oriented x3 with no focal motor or sensory deficits appreciated bilaterally.  SKIN: Moist and warm with no rashes appreciated.  Psych: Not anxious, depressed LN: No inguinal LN enlargement    Antibiotics   Anti-infectives    Start     Dose/Rate Route Frequency Ordered Stop   03/12/16 1030  meropenem (MERREM) IVPB SOLR 500 mg     500 mg 100 mL/hr over 30 Minutes Intravenous Every 12 hours 03/12/16 0918     03/10/16 1500  cefTRIAXone (ROCEPHIN) 1 g in dextrose 5 % 50 mL IVPB  Status:  Discontinued     1 g 120 mL/hr over 30 Minutes Intravenous Daily-1800 03/10/16 1341 03/12/16 0845      Medications   Scheduled Meds: . alum & mag hydroxide-simeth  15 mL Oral BID  . amLODipine  5 mg Oral Daily  . apixaban  5 mg Oral BID  . atorvastatin  20 mg Oral QHS  . feeding supplement (ENSURE ENLIVE)  237 mL Oral TID WC  . gabapentin  300 mg Oral QHS  . insulin aspart  0-5 Units Subcutaneous QHS  . insulin aspart  0-9 Units Subcutaneous TID WC  . meropenem  500 mg  Intravenous Q12H  . ondansetron  4 mg Oral BID  . pantoprazole  40 mg Oral BID  . tamsulosin  0.4 mg Oral Daily   Continuous Infusions: PRN Meds:.acetaminophen, morphine injection, ondansetron (ZOFRAN) IV   Data Review:   Micro Results Recent Results (from the past 240 hour(s))  Blood Culture (routine x 2)     Status: None (Preliminary result)   Collection Time: 03/09/16  3:11 PM  Result Value Ref Range Status   Specimen Description BLOOD RIGHT AC  Final   Special Requests BOTTLES DRAWN AEROBIC AND ANAEROBIC BCAV  Final   Culture NO GROWTH 3 DAYS  Final   Report Status PENDING  Incomplete  Blood Culture (routine x 2)     Status: None (Preliminary result)   Collection Time: 03/09/16  3:40 PM  Result Value Ref Range Status   Specimen Description BLOOD RIGHT AC  Final   Special Requests BOTTLES DRAWN AEROBIC AND ANAEROBIC BCAV  Final   Culture NO GROWTH 3 DAYS  Final   Report Status PENDING  Incomplete  Body fluid culture     Status: None (Preliminary result)   Collection Time: 03/10/16 10:45 AM  Result Value Ref Range Status   Specimen Description PLEURAL  Final   Special Requests NONE  Final   Gram Stain   Final    ABUNDANT WBC PRESENT, PREDOMINANTLY MONONUCLEAR NO ORGANISMS SEEN    Culture   Final    NO GROWTH 2 DAYS Performed at [facility], [address] [place], [place] [address]    Report Status PENDING  Incomplete  Urine culture     Status: Abnormal   Collection Time: 03/10/16 12:16 PM  Result Value Ref Range Status   Specimen Description URINE, RANDOM  Final   Special Requests NONE  Final   Culture >=100,000 COLONIES/mL ENTEROBACTER SPECIES (A)  Final   Report Status 03/12/2016 FINAL  Final   Organism ID, Bacteria ENTEROBACTER SPECIES (A)  Final      Susceptibility   Enterobacter species - MIC*    CEFAZOLIN >=64 RESISTANT Resistant     CEFTRIAXONE >=64 RESISTANT Resistant     CIPROFLOXACIN <=0.25 SENSITIVE Sensitive     GENTAMICIN <=1 SENSITIVE  Sensitive     IMIPENEM 1 SENSITIVE Sensitive     NITROFURANTOIN 64 INTERMEDIATE Intermediate     TRIMETH/SULFA <=20 SENSITIVE Sensitive     PIP/TAZO >=128 RESISTANT Resistant     * >=100,000 COLONIES/mL ENTEROBACTER SPECIES    Radiology Reports Dg Chest 1 View  Result Date: 03/10/2016 CLINICAL DATA:  Status post right thoracentesis. EXAM: CHEST 1 VIEW COMPARISON:  03/09/2016 FINDINGS: No significant residual right pleural fluid volume after thoracentesis. No pneumothorax. Residual atelectasis remains in both lower lungs, right greater than left. No pulmonary edema. The heart size is stable. IMPRESSION: No significant residual right pleural fluid after thoracentesis. No pneumothorax is seen. Both lower lobes demonstrate atelectasis, right greater than left. Electronically Signed   By: Aletta Edouard M.D.   On: 03/10/2016 11:18   Dg Chest 2 View  Result Date: 03/07/2016 CLINICAL DATA:  Hyperglycemia, dyspnea and weakness. Diabetes and hypertension. History of recent pulmonary embolus. EXAM: CHEST  2 VIEW COMPARISON:  February 26, 2016 chest CT and CXR FINDINGS: Low lung volumes are noted on current exam. Stable cardiomegaly with aortic atherosclerosis. Moderate right effusion with adjacent compressive atelectasis without significant change allowing for low lung volumes. It appears slightly greater on the frontal projection does smaller on the lateral since previous. Small left pleural effusion. No overt pulmonary edema or pneumothorax. ACDF of the cervical spine. Osteoarthritis of both glenohumeral and AC joints. Chronic stable degenerate change of the dorsal spine. IMPRESSION: 1. Low lung volumes on current exam with chronic moderate right and small left pleural effusions. Likely adjacent to compressive atelectasis due to the pleural effusions bilaterally. 2. Aortic atherosclerosis. Electronically Signed   By: Ashley Royalty M.D.   On: 03/07/2016 17:35   Dg Chest 2 View  Result Date:  02/26/2016 CLINICAL DATA:  Patient with weakness.  Shortness of breath. EXAM: CHEST  2 VIEW COMPARISON:  Chest radiograph 01/30/2016. FINDINGS: Anterior cervical spinal fusion hardware. Cardiomegaly. Monitoring leads overlie the patient. Patchy consolidation within the right mid and lower lung. Moderate right pleural effusion. Probable small left pleural effusion. IMPRESSION: Patchy consolidation within the right mid lower lung concerning for pneumonia in the appropriate clinical setting. Additional consideration would be atelectasis in the setting of pleural effusion. Moderate layering right pleural effusion. Probable small left pleural effusion. Electronically Signed   By: Lovey Newcomer M.D.   On: 02/26/2016 16:44   Ct Angio Chest Pe W Or Wo Contrast  Result Date: 03/09/2016 CLINICAL DATA:  Left-sided chest pain. Pain to palpation along the left abdomen. EXAM: CT ANGIOGRAPHY CHEST CT ABDOMEN AND PELVIS WITH CONTRAST TECHNIQUE: Multidetector CT imaging of the chest was performed using the standard protocol during bolus administration of intravenous contrast. Multiplanar CT image reconstructions and MIPs were obtained to evaluate the vascular anatomy. Multidetector CT imaging of the abdomen and pelvis was performed using the standard protocol during bolus administration of intravenous contrast. CONTRAST:  75 cc Isovue 370 intravenous COMPARISON:  02/26/2016 FINDINGS: CTA CHEST FINDINGS Cardiovascular: Borderline cardiomegaly. Question small volume pericardial fluid posteriorly. Satisfactory opacification of the pulmonary arteries. Significant decrease in  pulmonary artery clot that was seen previously. No web or new embolus identified. Normal right to left ventricle ratio. Atherosclerosis, including the coronary arteries. No acute aortic finding. Mediastinum/Nodes: Sliding hiatal hernia with prominent thickening an mucosal hyperenhancement of the lower esophagus. Superiorly the esophagus is patulous. Lungs/Pleura:  Moderate to large right and small left pleural effusions with multi segment atelectasis. No edema or consolidation. Musculoskeletal: No acute or aggressive finding Review of the MIP images confirms the above findings. CT ABDOMEN and PELVIS FINDINGS Hepatobiliary: No focal liver abnormality.No evidence of biliary obstruction or stone. Pancreas: Atrophic appearance of the pancreas tail without evidence of mass or ductal dilatation. Spleen: Unremarkable. Adrenals/Urinary Tract: Negative adrenals. Mass within the collecting system of of the upper and interpolar right kidney with speckled calcifications. The mass is somewhat low-density, but still primarily concerning for urothelial carcinoma. Given low-density debris/chronic hemorrhage is also considered. No related obstruction. No high-density clot. Mild hydronephrosis on the left. The urinary bladder is markedly distended. Stomach/Bowel: No obstruction. No inflammatory changes. Hiatal hernia described above. Vascular/Lymphatic: No acute vascular abnormality. No mass or adenopathy. Reproductive:Negative Other: No ascites or pneumoperitoneum. Musculoskeletal: Remote L3 compression fracture. Lower lumbar degenerative disc narrowing. Review of the MIP images confirms the above findings. IMPRESSION: 1. Excellent clearing of previously seen pulmonary emboli. No interval embolus identified. 2. Large right and small left pleural effusions are stable from 02/26/2016. Multi segment atelectasis. 3. Intrarenal and renal pelvis collecting system mass on the right. Recommend workup for urothelial carcinoma. 4. Lower esophageal thickening and mucosal hyper enhancement, likely reflux esophagitis in the setting of hiatal hernia. Follow-up is recommended. 5. Over distended urinary bladder. Electronically Signed   By: Monte Fantasia M.D.   On: 03/09/2016 15:04   Ct Angio Chest Pe W And/or Wo Contrast  Result Date: 02/26/2016 CLINICAL DATA:  Patient with shortness of breath.  Irregular heart rate. EXAM: CT ANGIOGRAPHY CHEST WITH CONTRAST TECHNIQUE: Multidetector CT imaging of the chest was performed using the standard protocol during bolus administration of intravenous contrast. Multiplanar CT image reconstructions and MIPs were obtained to evaluate the vascular anatomy. CONTRAST:  75 cc Isovue 370 COMPARISON:  Chest radiograph 02/26/2016 FINDINGS: Cardiovascular: Heart is enlarged. Small pericardial effusion. Aorta and main pulmonary artery are normal in caliber. Filling defects are identified within the right lower lobe pulmonary arteries as well as left upper and left lower lobe pulmonary arteries. No CT evidence to suggest acute right heart strain. Coronary arterial vascular calcifications. Mediastinum/Nodes: No enlarged axillary, mediastinal or hilar lymphadenopathy. There is circumferential wall thickening of the mid and distal esophagus. Small hiatal hernia. Lungs/Pleura: Central airways are patent. Atelectasis of the right middle lobe. Consolidation within the right and left lower lobes. Moderate right and small left layering pleural effusions. No pneumothorax. Upper Abdomen: Liver is diffusely low in attenuation compatible with steatosis. Adrenal glands are normal. Musculoskeletal: Mid thoracic spine degenerative changes. No aggressive or acute appearing osseous lesions. Review of the MIP images confirms the above findings. IMPRESSION: Findings compatible with acute pulmonary embolus within the right lower lobe, left upper lobe and left lower lobe pulmonary arteries. No evidence for right heart strain. There is circumferential wall thickening of the mid and distal esophagus. Acute esophagitis is not excluded. Small hiatal hernia. Consolidation of the right middle lobe with subpleural consolidation in the right lower and left lower lobes which may represent atelectasis. Infection not excluded. Moderate right and small left layering pleural effusions. Hepatic steatosis. Aortic  atherosclerosis. Critical Value/emergent results were called by  telephone at the time of interpretation on 02/26/2016 at 7:22 pm to Dr. Dorie Rank , who verbally acknowledged these results. Electronically Signed   By: Lovey Newcomer M.D.   On: 02/26/2016 19:23   Ct Abdomen Pelvis W Contrast  Result Date: 03/09/2016 CLINICAL DATA:  Left-sided chest pain. Pain to palpation along the left abdomen. EXAM: CT ANGIOGRAPHY CHEST CT ABDOMEN AND PELVIS WITH CONTRAST TECHNIQUE: Multidetector CT imaging of the chest was performed using the standard protocol during bolus administration of intravenous contrast. Multiplanar CT image reconstructions and MIPs were obtained to evaluate the vascular anatomy. Multidetector CT imaging of the abdomen and pelvis was performed using the standard protocol during bolus administration of intravenous contrast. CONTRAST:  75 cc Isovue 370 intravenous COMPARISON:  02/26/2016 FINDINGS: CTA CHEST FINDINGS Cardiovascular: Borderline cardiomegaly. Question small volume pericardial fluid posteriorly. Satisfactory opacification of the pulmonary arteries. Significant decrease in pulmonary artery clot that was seen previously. No web or new embolus identified. Normal right to left ventricle ratio. Atherosclerosis, including the coronary arteries. No acute aortic finding. Mediastinum/Nodes: Sliding hiatal hernia with prominent thickening an mucosal hyperenhancement of the lower esophagus. Superiorly the esophagus is patulous. Lungs/Pleura: Moderate to large right and small left pleural effusions with multi segment atelectasis. No edema or consolidation. Musculoskeletal: No acute or aggressive finding Review of the MIP images confirms the above findings. CT ABDOMEN and PELVIS FINDINGS Hepatobiliary: No focal liver abnormality.No evidence of biliary obstruction or stone. Pancreas: Atrophic appearance of the pancreas tail without evidence of mass or ductal dilatation. Spleen: Unremarkable. Adrenals/Urinary  Tract: Negative adrenals. Mass within the collecting system of of the upper and interpolar right kidney with speckled calcifications. The mass is somewhat low-density, but still primarily concerning for urothelial carcinoma. Given low-density debris/chronic hemorrhage is also considered. No related obstruction. No high-density clot. Mild hydronephrosis on the left. The urinary bladder is markedly distended. Stomach/Bowel: No obstruction. No inflammatory changes. Hiatal hernia described above. Vascular/Lymphatic: No acute vascular abnormality. No mass or adenopathy. Reproductive:Negative Other: No ascites or pneumoperitoneum. Musculoskeletal: Remote L3 compression fracture. Lower lumbar degenerative disc narrowing. Review of the MIP images confirms the above findings. IMPRESSION: 1. Excellent clearing of previously seen pulmonary emboli. No interval embolus identified. 2. Large right and small left pleural effusions are stable from 02/26/2016. Multi segment atelectasis. 3. Intrarenal and renal pelvis collecting system mass on the right. Recommend workup for urothelial carcinoma. 4. Lower esophageal thickening and mucosal hyper enhancement, likely reflux esophagitis in the setting of hiatal hernia. Follow-up is recommended. 5. Over distended urinary bladder. Electronically Signed   By: Monte Fantasia M.D.   On: 03/09/2016 15:04   US Venous Img Lower Bilateral  Result Date: 02/27/2016 CLINICAL DATA:  Pulmonary thromboembolism yesterday EXAM: BILATERAL LOWER EXTREMITY VENOUS DUPLEX ULTRASOUND TECHNIQUE: Doppler venous assessment of the left lower extremity deep venous system was performed, including characterization of spectral flow, compressibility, and phasicity. COMPARISON:  None. FINDINGS: There is partially occlusive thrombus in the right common femoral vein. The femoral and popliteal veins are compressible and are without thrombus. Doppler analysis demonstrates respiratory phasicity in the deep venous system  of the right lower extremity. In the left lower extremity, there is complete compressibility of the left common femoral, femoral, and popliteal veins. There is partially occlusive thrombus within the posterior tibial vein of the left calf. There is also superficial vein thrombus in the left lesser saphenous vein. IMPRESSION: The study is positive for DVT. There is partially occlusive thrombus in the right common femoral vein and  left posterior tibial veins. The femoral and popliteal veins are patent bilaterally. Electronically Signed   By: Marybelle Killings M.D.   On: 02/27/2016 14:40   Dg Chest Portable 1 View  Result Date: 03/09/2016 CLINICAL DATA:  Shortness of breath and chest pain EXAM: PORTABLE CHEST 1 VIEW COMPARISON:  March 07, 2016 FINDINGS: There is partially loculated pleural effusion on the right. There is patchy atelectasis in the right mid lung and right base regions. There is no demonstrable airspace consolidation currently in this area. On the left, there is slight left base atelectasis. Left lung otherwise clear. Heart is mildly enlarged with pulmonary vascularity within normal limits. There is atherosclerotic calcification in the aorta. There is no evident adenopathy. There is atherosclerotic calcification in both carotid arteries. There is evidence of old trauma involving the acromioclavicular joint with separation in this area, chronic. Bones are osteoporotic. Postoperative changes noted in the lower cervical spine. IMPRESSION: Partially loculated right pleural effusion with patchy atelectasis in the right mid and lower lung zones. No frank consolidation currently in the right base. Mild left base atelectasis. Stable cardiomegaly with pulmonary vascularity within normal limits. Bones osteoporotic. Chronic acromioclavicular separation on the right. Extensive carotid artery calcification bilaterally noted. There is also aortic atherosclerosis. Electronically Signed   By: Lowella Grip III M.D.    On: 03/09/2016 14:18   US Thoracentesis Asp Pleural Space W/img Guide  Result Date: 03/10/2016 INDICATION: Chest pain and right pleural effusion on recent imaging. EXAM: ULTRASOUND GUIDED RIGHT THORACENTESIS MEDICATIONS: None. COMPLICATIONS: None immediate. PROCEDURE: An ultrasound guided thoracentesis was thoroughly discussed with the patient and questions answered. The benefits, risks, alternatives and complications were also discussed. The patient understands and wishes to proceed with the procedure. Written consent was obtained. Ultrasound was performed to localize and mark an adequate pocket of fluid in the right chest. The area was then prepped and draped in the normal sterile fashion. 1% Lidocaine was used for local anesthesia. Under ultrasound guidance a 6 Fr Safe-T-Centesis catheter was introduced. Thoracentesis was performed. The catheter was removed and a dressing applied. FINDINGS: A total of approximately 410 mL of yellow fluid was removed. Samples were sent to the laboratory as requested by the clinical team. IMPRESSION: Successful ultrasound guided right thoracentesis yielding 410 mL of pleural fluid. Electronically Signed   By: Markus Daft M.D.   On: 03/10/2016 11:30     CBC  Recent Labs Lab 03/07/16 1559 03/09/16 1324  WBC 6.2 5.2  HGB 10.2* 9.5*  HCT 31.2* 27.1*  PLT 256 293  MCV 86.7 83.6  MCH 28.3 29.2  MCHC 32.7 34.9  RDW 14.5 15.2*  LYMPHSABS 2.0  --   MONOABS 0.4  --   EOSABS 0.0  --   BASOSABS 0.0  --     Chemistries   Recent Labs Lab 03/07/16 1656 03/09/16 1324 03/10/16 0805  NA 132* 134* 135  K 3.3* 3.7 3.8  CL 87* 89* 92*  CO2 36* 35* 34*  GLUCOSE 240* 227* 132*  BUN 25* 32* 31*  CREATININE 1.26* 1.50* 1.42*  CALCIUM 8.1* 8.1* 7.8*  AST 16 14*  --   ALT 12* 10*  --   ALKPHOS 64 68  --   BILITOT 0.5 0.9  --    ------------------------------------------------------------------------------------------------------------------ estimated creatinine  clearance is 28.1 mL/min (by C-G formula based on SCr of 1.42 mg/dL (H)). ------------------------------------------------------------------------------------------------------------------ No results for input(s): HGBA1C in the last 72 hours. ------------------------------------------------------------------------------------------------------------------ No results for input(s): CHOL, HDL, LDLCALC, TRIG, CHOLHDL,  LDLDIRECT in the last 72 hours. ------------------------------------------------------------------------------------------------------------------ No results for input(s): TSH, T4TOTAL, T3FREE, THYROIDAB in the last 72 hours.  Invalid input(s): FREET3 ------------------------------------------------------------------------------------------------------------------ No results for input(s): VITAMINB12, FOLATE, FERRITIN, TIBC, IRON, RETICCTPCT in the last 72 hours.  Coagulation profile  Recent Labs Lab 03/09/16 1324  INR 1.83    No results for input(s): DDIMER in the last 72 hours.  Cardiac Enzymes  Recent Labs Lab 03/09/16 1756 03/09/16 2040 03/09/16 2355  TROPONINI <0.03 <0.03 <0.03   ------------------------------------------------------------------------------------------------------------------ Invalid input(s): POCBNP    Assessment & Plan   Viva Chambliss  is a 83 y.o. female with a known history of Diabetes mellitus, hypertension, hyper lipidemia recent history of pulmonary embolism diagnosed on 02/26/2016 at [facility], currently on eliquis is coming to the ED via EMS with a chief complaint of chest pain. EKG with a left bundle branch block which is old when compared to the previous records. Initial troponin is negative. Patient is a poor historian from chronic dementia.    #Chest pain  Cardiac enzymes are negative Seen by cardiology no further workup needed  Possible noncardiac chest pain  related to esophagitis continue  PPIs twice a day  #Urinary  retention patient started on Flomax continues to have urinary retention Foley in place  # UTI due to enterobac special resistant to ceftriaxone, start meropenum   #Large right-sided pleural effusion etiology unclear Status post drainage Cytology pending  # H/o PE Pt is on Eliquis  CT shows less pulmonary artery burden   #Possible ureteral cancer cytology of the urine pending Out pt urology eval  # DM  Continue ssi Per diabetes coordinator stop glipizide    #Chronic kidney disease Creatinine at 1.5 seems to be at her baseline Continue close monitoring and avoid nephrotoxins  #UTI and tinea IV ceftriaxone and await cultures  Please note patient was DO NOT RESUSCITATE however when I spoke to her daughter today she states that her and her mom have decided to change her to a full code. I have placed palliative care consult on Friday they will be able to see the patient hopefully tomorrow prior to discharge.   DVT prophylaxis on eliquis     Code Status Orders        Start     Ordered   03/09/16 1634  Do not attempt resuscitation (DNR)  Continuous    Question Answer Comment  In the event of cardiac or respiratory ARREST Do not call a "code blue"   In the event of cardiac or respiratory ARREST Do not perform Intubation, CPR, defibrillation or ACLS   In the event of cardiac or respiratory ARREST Use medication by any route, position, wound care, and other measures to relive pain and suffering. May use oxygen, suction and manual treatment of airway obstruction as needed for comfort.   Comments RN may pronounce      03/09/16 1633    Code Status History    Date Active Date Inactive Code Status Order ID Comments User Context   02/26/2016  9:39 PM 03/01/2016 10:37 PM DNR [id number]  Karmen Bongo, MD Inpatient   02/26/2016  7:48 PM 02/26/2016  9:39 PM DNR [id number]  Dorie Rank, MD ED   01/31/2016  5:45 PM 02/07/2016 11:45 PM DNR [id number]  Flora Lipps, MD Inpatient   01/30/2016  11:19 PM 01/31/2016  5:44 PM Full Code [id number]  Harvie Bridge, DO Inpatient   11/26/2014  3:52 PM 11/28/2014  3:07 PM Full Code [id number]  Vipul  Manuella Ghazi, MD Inpatient   11/26/2014  8:59 AM 11/26/2014  3:52 PM Full Code [id number]  Max Sane, MD ED    Advance Directive Documentation   [place] Most Recent Value  Type of Advance Directive  Living will  Pre-existing out of facility DNR order (yellow form or pink MOST form)  No data  "MOST" Form in Place?  No data           Consults  cardiology   DVT Prophylaxis  eliquis  Lab Results  Component Value Date   PLT 293 03/09/2016     Time Spent in minutes  58min  Greater than 50% of time spent in care coordination and counseling patient regarding the condition and plan of care.   Dustin Flock M.D on 03/12/2016 at 12:21 PM  Between 7am to 6pm - Pager - [phone]  After 6pm go to [email or web address] - password [email or web address] [facility]   Office  [phone]

## 2016-03-12 NOTE — Clinical Social Work Note (Signed)
CSW met with the patient's daughter, [name], to discuss dc planning and for the CSW to provide emotional support to [place]. During the discussion, the patient and her daughter confirmed that the patient no longer wants to be DNR. The CSW updated the attending to this change from DNR to full code and the RN assigned to the room is aware as well. [name] daughter does not want anyone to inform her mother of the suspected mass on her kidney so as not to upset her. CSW reported that this information would be noted in the chart and to the receiving CSW for M-F. [name] daughter thanked the CSW for assistance. CSW will con't to follow pending additional dc needs and to coordinate dc to SNF when medically stable.  Santiago Bumpers, MSW, Latanya Presser [phone]

## 2016-03-13 ENCOUNTER — Encounter
Admission: RE | Admit: 2016-03-13 | Discharge: 2016-03-13 | Disposition: A | Discharge status: Home / Self Care | Payer: Medicare Other | Source: Ambulatory Visit | Attending: Internal Medicine | Admitting: Internal Medicine

## 2016-03-13 DIAGNOSIS — Z7189 Other specified counseling: Secondary | ICD-10-CM

## 2016-03-13 DIAGNOSIS — R627 Adult failure to thrive: Secondary | ICD-10-CM

## 2016-03-13 DIAGNOSIS — Z515 Encounter for palliative care: Secondary | ICD-10-CM

## 2016-03-13 LAB — BASIC METABOLIC PANEL
ANION GAP: 4 — AB (ref 5–15)
BUN: 18 mg/dL (ref 6–20)
CALCIUM: 7.8 mg/dL — AB (ref 8.9–10.3)
CO2: 33 mmol/L — AB (ref 22–32)
Chloride: 98 mmol/L — ABNORMAL LOW (ref 101–111)
Creatinine, Ser: 1.1 mg/dL — ABNORMAL HIGH (ref 0.44–1.00)
GFR, EST AFRICAN AMERICAN: 51 mL/min — AB (ref >60)
GFR, EST NON AFRICAN AMERICAN: 44 mL/min — AB (ref >60)
GLUCOSE: 165 mg/dL — AB (ref 65–99)
POTASSIUM: 4.1 mmol/L (ref 3.5–5.1)
Sodium: 135 mmol/L (ref 135–145)

## 2016-03-13 LAB — GLUCOSE, CAPILLARY
GLUCOSE-CAPILLARY: 206 mg/dL — AB (ref 65–99)
Glucose-Capillary: 174 mg/dL — ABNORMAL HIGH (ref 65–99)
Glucose-Capillary: 157 mg/dL — ABNORMAL HIGH (ref 65–99)

## 2016-03-13 LAB — CBC
HEMATOCRIT: 24.8 % — AB (ref 35.0–47.0)
Hemoglobin: 8.4 g/dL — ABNORMAL LOW (ref 12.0–16.0)
MCH: 28.4 pg (ref 26.0–34.0)
MCHC: 33.7 g/dL (ref 32.0–36.0)
MCV: 84.2 fL (ref 80.0–100.0)
Platelets: 318 10*3/uL (ref 150–440)
RBC: 2.94 MIL/uL — AB (ref 3.80–5.20)
RDW: 15.1 % — AB (ref 11.5–14.5)
WBC: 3.6 10*3/uL (ref 3.6–11.0)

## 2016-03-13 LAB — CYTOLOGY - NON PAP

## 2016-03-13 MED ORDER — TRAMADOL HCL 50 MG PO TABS: 50.0000 mg | ORAL_TABLET | Freq: Three times a day (TID) | ORAL | Status: DC | PRN

## 2016-03-13 MED ORDER — CIPROFLOXACIN HCL 500 MG PO TABS: 500.0000 mg | ORAL_TABLET | Freq: Two times a day (BID) | ORAL | 0 refills | Status: DC

## 2016-03-13 MED ORDER — CIPROFLOXACIN HCL 500 MG PO TABS
500.0000 mg | ORAL_TABLET | Freq: Two times a day (BID) | ORAL | Status: DC
  Administered 2016-03-13: 500 mg via ORAL
  Filled 2016-03-13: qty 1

## 2016-03-13 MED ORDER — PANTOPRAZOLE SODIUM 40 MG PO TBEC: 40.0000 mg | DELAYED_RELEASE_TABLET | Freq: Every day | ORAL | 0 refills | Status: DC

## 2016-03-13 MED ORDER — TAMSULOSIN HCL 0.4 MG PO CAPS: 0.4000 mg | ORAL_CAPSULE | Freq: Every day | ORAL | 0 refills | Status: DC

## 2016-03-13 NOTE — Progress Notes (Signed)
Report called to Hilliard Clark, Therapist, sports at [facility]. All questions answered. Will call EMS for transport to facility. Patient updated.

## 2016-03-13 NOTE — Consult Note (Signed)
Consultation Note Date: 03/13/2016   Patient Name: Sherry [name]  DOB: Apr 14, 1927  MRN: [id number]  Age / Sex: 83 y.o., female  PCP: [facility] Referring Physician: Fritzi Mandes, MD  Reason for Consultation: Establishing goals of care  HPI/Patient Profile: 83 y.o. female  with past medical history of dementia, DM, CKD, HTN, hyperlipidemia, recent PE (02/26/16- on Eliquis) admitted on 03/09/2016 with chest pain. Workup showed left bundle branch block, troponin negative. CT showed clearing of PE, with large right and small left pleural effusions. There is also a intrarenal and R pelvic mass noted on CT concerning for malignancy. This is the 3rd admission in the last 6 months (1/21- sepsis, 2/17- PE) Palliative medicine consulted for [place].  Clinical Assessment and Goals of Care: Meeting was arranged with patient's daughter- [name] for 3:30pm, however, [name] was unable to attend. I was able to meet with patient. Patient is poor historian due to dementia, but we were able to talk some about her health and goals. Patient tells me she hasn't walked in 6 weeks due to "just being weak". She tells me she is ready for discharge to [facility] and has goals of working with PT to be able to walk again. Noted patient was DNR on admission- but during discussion with Dr. Posey Pronto patient's daughter changed status to Full Code. Patient tells me that if she dies she does not want "to be worked on, but my daughter wants it so I have to do that".  Discussed outpatient Palliative f/u with patient and over phone with [name]. Both are agreeable to referral as patient is scheduled for discharge currently. Of note palliative team has consulted previously on patient and recommended outpatient palliative services as well.  Primary Decision Maker HCPOA - daughter HCPOA    SUMMARY OF RECOMMENDATIONS -Full code -Palliative  services at [facility]- for continued [place]    Code Status/Advance Care Planning:  Full code  Prognosis:    Unable to determine  Discharge Planning: [place] for rehab with Palliative care service follow-up  Primary Diagnoses: Present on Admission: . Chest pain . UTI (urinary tract infection)   I have reviewed the medical record, interviewed the patient and family, and examined the patient. The following aspects are pertinent.  Past Medical History:  Diagnosis Date  . Diabetes mellitus without complication ([place])   . Hyperlipidemia   . Hypertension    Social History   Social History  . Marital status: Single    Spouse name: N/A  . Number of children: N/A  . Years of education: N/A   Social History Main Topics  . Smoking status: Never Smoker  . Smokeless tobacco: Never Used  . Alcohol use No  . Drug use: No  . Sexual activity: Not Asked   Other Topics Concern  . None   Social History Narrative  . None   History reviewed. No pertinent family history. Scheduled Meds: . alum & mag hydroxide-simeth  15 mL Oral BID  .  amLODipine  5 mg Oral Daily  . apixaban  5 mg Oral BID  . atorvastatin  20 mg Oral QHS  . ciprofloxacin  500 mg Oral BID  . feeding supplement (ENSURE ENLIVE)  237 mL Oral TID WC  . gabapentin  300 mg Oral QHS  . insulin aspart  0-5 Units Subcutaneous QHS  . insulin aspart  0-9 Units Subcutaneous TID WC  . ondansetron  4 mg Oral BID  . pantoprazole  40 mg Oral BID  . tamsulosin  0.4 mg Oral Daily   Continuous Infusions: PRN Meds:.acetaminophen, ondansetron (ZOFRAN) IV, traMADol Medications Prior to Admission:  Prior to Admission medications   Medication Sig Start Date End Date Taking? Authorizing Provider  amLODipine (NORVASC) 5 MG tablet Take 5 mg by mouth daily.   Yes Historical Provider, MD  apixaban (ELIQUIS) 5 MG TABS tablet Take 1 tablet (5 mg total) by mouth 2 (two) times daily. 03/08/16  Yes Erline Hau, MD  atorvastatin (LIPITOR) 20 MG tablet Take 1 tablet by mouth at bedtime.   Yes Historical Provider, MD  gabapentin (NEURONTIN) 300 MG capsule Take 1 capsule by mouth at bedtime.   Yes Historical Provider, MD  glipiZIDE (GLUCOTROL XL) 10 MG 24 hr tablet Take 1 tablet by mouth daily.   Yes Historical Provider, MD  Insulin Detemir (LEVEMIR) 100 UNIT/ML Pen Inject 13 Units into the skin every morning.   Yes Historical Provider, MD  lisinopril-hydrochlorothiazide (PRINZIDE,ZESTORETIC) 20-12.5 MG tablet Take 2 tablets by mouth daily.    Yes Historical Provider, MD  meloxicam (MOBIC) 7.5 MG tablet Take 7.5 mg by mouth daily.   Yes Historical Provider, MD  metFORMIN (GLUCOPHAGE) 1000 MG tablet Take 1,000 mg by mouth 2 (two) times daily with a meal.   Yes Historical Provider, MD  acetaminophen (TYLENOL) 325 MG tablet Take 650 mg by mouth every 4 (four) hours as needed for mild pain or moderate pain.    Historical Provider, MD  apixaban (ELIQUIS) 5 MG TABS tablet Take 2 tablets (10 mg total) by mouth 2 (two) times daily. Patient not taking: Reported on 03/09/2016 03/01/16   Erline Hau, MD  ciprofloxacin (CIPRO) 500 MG tablet Take 1 tablet (500 mg total) by mouth 2 (two) times daily. 03/13/16   Fritzi Mandes, MD  feeding supplement, ENSURE ENLIVE, (ENSURE ENLIVE) LIQD Take 237 mLs by mouth 3 (three) times daily with meals. 02/07/16   Demetrios Loll, MD  ondansetron (ZOFRAN) 4 MG tablet Take 4 mg by mouth 2 (two) times daily. *May take every 8 hours as needed for nausea and vomiting    Historical Provider, MD  pantoprazole (PROTONIX) 40 MG tablet Take 1 tablet (40 mg total) by mouth daily. 03/13/16   Fritzi Mandes, MD  potassium chloride SA (K-DUR,KLOR-CON) 20 MEQ tablet Take 1 tablet (20 mEq total) by mouth 2 (two) times daily. For 4 days Patient not taking: Reported on 03/09/2016 03/01/16   Erline Hau, MD  tamsulosin (FLOMAX) 0.4 MG CAPS capsule Take 1 capsule (0.4 mg total) by mouth daily.  03/14/16   Fritzi Mandes, MD   No Known Allergies Review of Systems  Physical Exam  Constitutional: No distress.  Frail, elderly  HENT:  Head: Normocephalic.  Cardiovascular: Normal rate and regular rhythm.   Pulmonary/Chest: Effort normal.  Neurological: She is alert.  Oriented to person and place  Skin: There is pallor.  Nursing note and vitals reviewed.   Vital Signs: BP (!) 135/45 (BP Location:  Left Arm)   Pulse 81   Temp 98.1 F (36.7 C) (Oral)   Resp 18   Ht 5\' 4"  (1.626 m)   Wt 74.7 kg (164 lb 11.2 oz)   SpO2 95%   BMI 28.27 kg/m  Pain Assessment: 0-10   Pain Score: 0-No pain   SpO2: SpO2: 95 % O2 Device:SpO2: 95 % O2 Flow Rate: .O2 Flow Rate (L/min): 2 L/min  IO: Intake/output summary:  Intake/Output Summary (Last 24 hours) at 03/13/16 1543 Last data filed at 03/13/16 1420  Gross per 24 hour  Intake              650 ml  Output                0 ml  Net              650 ml    LBM: Last BM Date: 03/12/16 Baseline Weight: Weight: 74.7 kg (164 lb 11.2 oz) Most recent weight: Weight: 74.7 kg (164 lb 11.2 oz)     Palliative Assessment/Data: PPS: 40%   Flowsheet Rows   Flowsheet Row Most Recent Value  Intake Tab  Referral Department  Hospitalist  Unit at Time of Referral  Cardiac/Telemetry Unit  Palliative Care Primary Diagnosis  Cardiac  Date Notified  03/10/16  Palliative Care Type  Return patient Palliative Care  Reason for referral  Clarify Goals of Care  Date of Admission  03/09/16  Date first seen by Palliative Care  03/13/16  # of days Palliative referral response time  3 Day(s)  # of days IP prior to Palliative referral  1  Clinical Assessment  Psychosocial & Spiritual Assessment  Palliative Care Outcomes  Actual Discharge Date  03/13/16 [SNF]      Thank you for this consult. Palliative medicine will continue to follow and assist as needed.    Time Total:60 minutes Greater than 50%  of this time was spent counseling and coordinating care  related to the above assessment and plan.  Signed by: Mariana Kaufman, AGNP-C Palliative Medicine    Please contact Palliative Medicine Team phone at [phone] for questions and concerns.  For individual provider: See Shea Evans

## 2016-03-13 NOTE — Discharge Summary (Addendum)
[facility] [place] NAME: Sherry [name]    MR#:  [id number]  DATE OF BIRTH:  1927/12/28  DATE OF ADMISSION:  03/09/2016 ADMITTING PHYSICIAN: Nicholes Mango, MD  DATE OF DISCHARGE: 03/13/2016  PRIMARY CARE PHYSICIAN: [place]    ADMISSION DIAGNOSIS:  Chest pain, moderate coronary artery risk [R07.9] Chest pain [R07.9]  DISCHARGE DIAGNOSIS:  Chest pain atypical New right kidney mass---f/u as out pt with Dr Caryl Pina brandon for further w/u Right pleural effusion s/p thoracentesis H/o Pulmonary embolism (new in feb 2018-on po eliquis) ECOLI UTI  SECONDARY DIAGNOSIS:   Past Medical History:  Diagnosis Date  . Diabetes mellitus without complication ([place])   . Hyperlipidemia   . Hypertension     HOSPITAL COURSE:   Sherry [name] a 83 y.o. femalewith a known history of Diabetes mellitus, hypertension, hyper lipidemia recent history of pulmonary embolism diagnosed on 02/26/2016 at [facility], currently on eliquisis coming to the ED via EMS with a chief complaint of chest pain.EKG with a left bundle branch block which is old when compared to the previous records. Initial troponin is negative. Patient is a poor historian from chronic dementia.    #Chest pain appears atypical Cardiac enzymes are negative Seen by cardiology no further workup needed  Possible noncardiac chest pain  related to esophagitis continue  PPIs twice a day  #Urinary retention patient started on Flomax continues to have urinary retention Foley in place -spoke with Dr Erlene Quan for pt to f/u as out pt  # UTI due to enterobac special resistant to ceftriaxone -switch to oral cipro  #Large right-sided pleural effusion etiology unclear Status post drainage Cytology pending -removed 400 cc of fluid  # H/o PE Pt is on Eliquis  CT shows less pulmonary artery burden  #Possible right kidney mass and urothelial cancer cytology of  the urine pending Out pt urology eval-Dr brandon aware  # DM  Continue ssi Per diabetes coordinator stop glipizide -pt on levemir and SSI D/ced metformin due to creat  Overall [place]  D/w dter Sherry [name] Pt is Full code now D/c to rehab today  PALLIATIVE CARE TO F/U at SNF CONSULTS OBTAINED:  Treatment Team:  Isaias Cowman, MD  DRUG ALLERGIES:  No Known Allergies  DISCHARGE MEDICATIONS:   Current Discharge Medication List    START taking these medications   Details  ciprofloxacin (CIPRO) 500 MG tablet Take 1 tablet (500 mg total) by mouth 2 (two) times daily. Qty: 10 tablet, Refills: 0    pantoprazole (PROTONIX) 40 MG tablet Take 1 tablet (40 mg total) by mouth daily. Qty: 30 tablet, Refills: 0    tamsulosin (FLOMAX) 0.4 MG CAPS capsule Take 1 capsule (0.4 mg total) by mouth daily. Qty: 30 capsule, Refills: 0      CONTINUE these medications which have NOT CHANGED   Details  amLODipine (NORVASC) 5 MG tablet Take 5 mg by mouth daily.    apixaban (ELIQUIS) 5 MG TABS tablet Take 1 tablet (5 mg total) by mouth 2 (two) times daily. Qty: 60 tablet, Refills: 3    atorvastatin (LIPITOR) 20 MG tablet Take 1 tablet by mouth at bedtime.    gabapentin (NEURONTIN) 300 MG capsule Take 1 capsule by mouth at bedtime.    Insulin Detemir (LEVEMIR) 100 UNIT/ML Pen Inject 13 Units into the skin every morning.    lisinopril-hydrochlorothiazide (PRINZIDE,ZESTORETIC) 20-12.5 MG tablet Take 2 tablets by mouth daily.     meloxicam (  MOBIC) 7.5 MG tablet Take 7.5 mg by mouth daily.    acetaminophen (TYLENOL) 325 MG tablet Take 650 mg by mouth every 4 (four) hours as needed for mild pain or moderate pain.    feeding supplement, ENSURE ENLIVE, (ENSURE ENLIVE) LIQD Take 237 mLs by mouth 3 (three) times daily with meals. Qty: 237 mL, Refills: 12    ondansetron (ZOFRAN) 4 MG tablet Take 4 mg by mouth 2 (two) times daily. *May take every 8 hours as needed for nausea and vomiting     potassium chloride SA (K-DUR,KLOR-CON) 20 MEQ tablet Take 1 tablet (20 mEq total) by mouth 2 (two) times daily. For 4 days Qty: 8 tablet, Refills: 0      STOP taking these medications     glipiZIDE (GLUCOTROL XL) 10 MG 24 hr tablet      metFORMIN (GLUCOPHAGE) 1000 MG tablet         If you experience worsening of your admission symptoms, develop shortness of breath, life threatening emergency, suicidal or homicidal thoughts you must seek medical attention immediately by calling 911 or calling your MD immediately  if symptoms less severe.  You Must read complete instructions/literature along with all the possible adverse reactions/side effects for all the Medicines you take and that have been prescribed to you. Take any new Medicines after you have completely understood and accept all the possible adverse reactions/side effects.   Please note  You were cared for by a hospitalist during your hospital stay. If you have any questions about your discharge medications or the care you received while you were in the hospital after you are discharged, you can call the unit and asked to speak with the hospitalist on call if the hospitalist that took care of you is not available. Once you are discharged, your primary care physician will handle any further medical issues. Please note that NO REFILLS for any discharge medications will be authorized once you are discharged, as it is imperative that you return to your primary care physician (or establish a relationship with a primary care physician if you do not have one) for your aftercare needs so that they can reassess your need for medications and monitor your lab values. Today   SUBJECTIVE   Doing well  VITAL SIGNS:  Blood pressure (!) 141/43, pulse 88, temperature 98.3 F (36.8 C), temperature source Oral, resp. rate 18, height 5\' 4"  (1.626 m), weight 74.7 kg (164 lb 11.2 oz), SpO2 96 %.  I/O:    Intake/Output Summary (Last 24 hours) at  03/13/16 1045 Last data filed at 03/13/16 1010  Gross per 24 hour  Intake              530 ml  Output              300 ml  Net              230 ml    PHYSICAL EXAMINATION:  GENERAL:  83 y.o.-year-old patient lying in the bed with no acute distress.  EYES: Pupils equal, round, reactive to light and accommodation. No scleral icterus. Extraocular muscles intact.  HEENT: Head atraumatic, normocephalic. Oropharynx and nasopharynx clear.  NECK:  Supple, no jugular venous distention. No thyroid enlargement, no tenderness.  LUNGS: Normal breath sounds bilaterally, no wheezing, rales,rhonchi or crepitation. No use of accessory muscles of respiration.  CARDIOVASCULAR: S1, S2 normal. No murmurs, rubs, or gallops.  ABDOMEN: Soft, non-tender, non-distended. Bowel sounds present. No organomegaly or mass. Foley +  EXTREMITIES: No pedal edema, cyanosis, or clubbing.  NEUROLOGIC: Cranial nerves II through XII are intact. Muscle strength 5/5 in all extremities. Sensation intact. Gait not checked.  PSYCHIATRIC: The patient is alert and oriented x 3.  SKIN: No obvious rash, lesion, or ulcer.   DATA REVIEW:   CBC   Recent Labs Lab 03/13/16 0425  WBC 3.6  HGB 8.4*  HCT 24.8*  PLT 318    Chemistries   Recent Labs Lab 03/09/16 1324  03/13/16 0425  NA 134*  < > 135  K 3.7  < > 4.1  CL 89*  < > 98*  CO2 35*  < > 33*  GLUCOSE 227*  < > 165*  BUN 32*  < > 18  CREATININE 1.50*  < > 1.10*  CALCIUM 8.1*  < > 7.8*  AST 14*  --   --   ALT 10*  --   --   ALKPHOS 68  --   --   BILITOT 0.9  --   --   < > = values in this interval not displayed.  Microbiology Results   Recent Results (from the past 240 hour(s))  Blood Culture (routine x 2)     Status: None (Preliminary result)   Collection Time: 03/09/16  3:11 PM  Result Value Ref Range Status   Specimen Description BLOOD RIGHT AC  Final   Special Requests BOTTLES DRAWN AEROBIC AND ANAEROBIC BCAV  Final   Culture NO GROWTH 4 DAYS  Final    Report Status PENDING  Incomplete  Blood Culture (routine x 2)     Status: None (Preliminary result)   Collection Time: 03/09/16  3:40 PM  Result Value Ref Range Status   Specimen Description BLOOD RIGHT AC  Final   Special Requests BOTTLES DRAWN AEROBIC AND ANAEROBIC BCAV  Final   Culture NO GROWTH 4 DAYS  Final   Report Status PENDING  Incomplete  Body fluid culture     Status: None (Preliminary result)   Collection Time: 03/10/16 10:45 AM  Result Value Ref Range Status   Specimen Description PLEURAL  Final   Special Requests NONE  Final   Gram Stain   Final    ABUNDANT WBC PRESENT, PREDOMINANTLY MONONUCLEAR NO ORGANISMS SEEN    Culture   Final    NO GROWTH 3 DAYS Performed at [facility], [facility] [address] [place], [place] [address]    Report Status PENDING  Incomplete  Urine culture     Status: Abnormal   Collection Time: 03/10/16 12:16 PM  Result Value Ref Range Status   Specimen Description URINE, RANDOM  Final   Special Requests NONE  Final   Culture >=100,000 COLONIES/mL ENTEROBACTER SPECIES (A)  Final   Report Status 03/12/2016 FINAL  Final   Organism ID, Bacteria ENTEROBACTER SPECIES (A)  Final      Susceptibility   Enterobacter species - MIC*    CEFAZOLIN >=64 RESISTANT Resistant     CEFTRIAXONE >=64 RESISTANT Resistant     CIPROFLOXACIN <=0.25 SENSITIVE Sensitive     GENTAMICIN <=1 SENSITIVE Sensitive     IMIPENEM 1 SENSITIVE Sensitive     NITROFURANTOIN 64 INTERMEDIATE Intermediate     TRIMETH/SULFA <=20 SENSITIVE Sensitive     PIP/TAZO >=128 RESISTANT Resistant     * >=100,000 COLONIES/mL ENTEROBACTER SPECIES    RADIOLOGY:  No results found.   Management plans discussed with the patient, family and they are in agreement.  CODE STATUS:  Code Status Orders        Start     Ordered   03/12/16 1222  Full code  Continuous     03/12/16 1221    Code Status History    Date Active Date Inactive Code Status Order ID Comments User Context    03/09/2016  4:33 PM 03/12/2016 12:21 PM DNR [id number]  Nicholes Mango, MD ED   02/26/2016  9:39 PM 03/01/2016 10:37 PM DNR [id number]  Karmen Bongo, MD Inpatient   02/26/2016  7:48 PM 02/26/2016  9:39 PM DNR [id number]  Dorie Rank, MD ED   01/31/2016  5:45 PM 02/07/2016 11:45 PM DNR [id number]  Flora Lipps, MD Inpatient   01/30/2016 11:19 PM 01/31/2016  5:44 PM Full Code [id number]  Harvie Bridge, DO Inpatient   11/26/2014  3:52 PM 11/28/2014  3:07 PM Full Code [id number]  Max Sane, MD Inpatient   11/26/2014  8:59 AM 11/26/2014  3:52 PM Full Code [id number]  Max Sane, MD ED    Advance Directive Documentation   Flowsheet Row Most Recent Value  Type of Advance Directive  Living will  Pre-existing out of facility DNR order (yellow form or pink MOST form)  No data  "MOST" Form in Place?  No data      TOTAL TIME TAKING CARE OF THIS PATIENT: 40 minutes.    Sherry [name] M.D on 03/13/2016 at 10:45 AM  Between 7am to 6pm - Pager - [phone] After 6pm go to [email or web address] - password [email or web address] [facility]  Office  [phone]  CC: Primary care physician; [facility]

## 2016-03-13 NOTE — Discharge Instructions (Signed)
Follow up Urology as out pt Foley care per protocol  Palliative care to f/u at [facility]

## 2016-03-13 NOTE — Progress Notes (Signed)
No charge note:  Palliative medicine consult received. Meeting arranged with daughter- [name] at 3:30pm today.  Mariana Kaufman, AGNP-C Palliative Medicine  Please call Palliative Medicine team phone with any questions [phone]. For individual providers please see AMION.

## 2016-03-13 NOTE — Progress Notes (Signed)
New referral for Palliative NP to follow at [facility] following discharge received from [place]. Referral made aware. Thank you. Flo Shanks RN, BSN, [place] [facility], hospital Liaison [phone]

## 2016-03-13 NOTE — Progress Notes (Signed)
Requested EMS for transfer to [facility]

## 2016-03-13 NOTE — Clinical Social Work Note (Addendum)
CSW received phone call from patient's daughter [name], CSW presented bed offers to her, patient's daughter stated she will call CSW back with decision.  CSW explained to patient's daughter that patient will be discharging today, and will need decision as soon as possible.  CSW awaiting call back from daughter with SNF decision.  1:20pm  CSW received phone call from patient's daughter [name] and she chose [facility] for short term rehab.  CSW contacted [facility], and they can accept patient today.  Patient to be d/c'ed today to [facility].  Patient and family agreeable to plans will transport via ems RN to call report to room 109 A wing [phone].  3:30pm CSW received consult that palliative care recommends patient be followed by palliative at SNF, CSW updated physician and made referral to nurse liaison from [facility] [place].  Jones Broom. [place], MSW, [place]  03/13/2016 11:33 AM

## 2016-03-13 NOTE — Progress Notes (Signed)
Discharge paperwork reviewed with patient. Information regarding follow-up appointments, medications and education for all newly prescribed meds was provided, all questions answered. Peripheral IV removed, catheter intact. Heart monitor removed, returned to the nurse station. Copy of AVS placed in discharge packet, to be taken with patient to facility.

## 2016-03-13 NOTE — Care Management (Signed)
RNCM has updated [facility] that patient will be discharging to [facility] with palliative referral. No other RNCM need.

## 2016-03-14 LAB — BODY FLUID CULTURE: CULTURE: NO GROWTH

## 2016-03-14 LAB — CULTURE, BLOOD (ROUTINE X 2)
CULTURE: NO GROWTH
Culture: NO GROWTH

## 2016-03-23 ENCOUNTER — Ambulatory Visit: Payer: Medicare Other | Admitting: Urology

## 2016-03-27 ENCOUNTER — Emergency Department: Payer: Medicare Other

## 2016-03-27 ENCOUNTER — Inpatient Hospital Stay
Admission: EM | Admit: 2016-03-27 | Discharge: 2016-03-29 | DRG: 700 | Disposition: A | Discharge status: Skilled Nursing Facility | Payer: Medicare Other | Attending: Internal Medicine | Admitting: Internal Medicine

## 2016-03-27 DIAGNOSIS — N390 Urinary tract infection, site not specified: Secondary | ICD-10-CM | POA: Diagnosis present

## 2016-03-27 DIAGNOSIS — Z87440 Personal history of urinary (tract) infections: Secondary | ICD-10-CM | POA: Diagnosis not present

## 2016-03-27 DIAGNOSIS — Z794 Long term (current) use of insulin: Secondary | ICD-10-CM

## 2016-03-27 DIAGNOSIS — Y92129 Unspecified place in nursing home as the place of occurrence of the external cause: Secondary | ICD-10-CM | POA: Diagnosis not present

## 2016-03-27 DIAGNOSIS — T83511A Infection and inflammatory reaction due to indwelling urethral catheter, initial encounter: Secondary | ICD-10-CM | POA: Diagnosis not present

## 2016-03-27 DIAGNOSIS — E119 Type 2 diabetes mellitus without complications: Secondary | ICD-10-CM | POA: Diagnosis present

## 2016-03-27 DIAGNOSIS — R112 Nausea with vomiting, unspecified: Secondary | ICD-10-CM | POA: Diagnosis present

## 2016-03-27 DIAGNOSIS — Z86711 Personal history of pulmonary embolism: Secondary | ICD-10-CM

## 2016-03-27 DIAGNOSIS — A084 Viral intestinal infection, unspecified: Secondary | ICD-10-CM | POA: Diagnosis present

## 2016-03-27 DIAGNOSIS — N2889 Other specified disorders of kidney and ureter: Secondary | ICD-10-CM | POA: Diagnosis present

## 2016-03-27 DIAGNOSIS — Y846 Urinary catheterization as the cause of abnormal reaction of the patient, or of later complication, without mention of misadventure at the time of the procedure: Secondary | ICD-10-CM | POA: Diagnosis present

## 2016-03-27 DIAGNOSIS — Z8701 Personal history of pneumonia (recurrent): Secondary | ICD-10-CM

## 2016-03-27 DIAGNOSIS — E785 Hyperlipidemia, unspecified: Secondary | ICD-10-CM | POA: Diagnosis present

## 2016-03-27 DIAGNOSIS — R1111 Vomiting without nausea: Secondary | ICD-10-CM

## 2016-03-27 DIAGNOSIS — I10 Essential (primary) hypertension: Secondary | ICD-10-CM | POA: Diagnosis present

## 2016-03-27 DIAGNOSIS — R338 Other retention of urine: Secondary | ICD-10-CM | POA: Diagnosis not present

## 2016-03-27 DIAGNOSIS — Z7901 Long term (current) use of anticoagulants: Secondary | ICD-10-CM

## 2016-03-27 LAB — URINALYSIS, COMPLETE (UACMP) WITH MICROSCOPIC
BACTERIA UA: NONE SEEN
BILIRUBIN URINE: NEGATIVE
GLUCOSE, UA: NEGATIVE mg/dL
KETONES UR: 20 mg/dL — AB
Nitrite: NEGATIVE
PROTEIN: NEGATIVE mg/dL
Specific Gravity, Urine: 1.015 (ref 1.005–1.030)
pH: 5 (ref 5.0–8.0)

## 2016-03-27 LAB — GLUCOSE, CAPILLARY: GLUCOSE-CAPILLARY: 179 mg/dL — AB (ref 65–99)

## 2016-03-27 LAB — BASIC METABOLIC PANEL
Anion gap: 10 (ref 5–15)
BUN: 16 mg/dL (ref 6–20)
CHLORIDE: 94 mmol/L — AB (ref 101–111)
CO2: 29 mmol/L (ref 22–32)
CREATININE: 0.96 mg/dL (ref 0.44–1.00)
Calcium: 8.1 mg/dL — ABNORMAL LOW (ref 8.9–10.3)
GFR calc non Af Amer: 52 mL/min — ABNORMAL LOW (ref >60)
Glucose, Bld: 134 mg/dL — ABNORMAL HIGH (ref 65–99)
Potassium: 3.3 mmol/L — ABNORMAL LOW (ref 3.5–5.1)
Sodium: 133 mmol/L — ABNORMAL LOW (ref 135–145)

## 2016-03-27 LAB — CBC
HCT: 31.8 % — ABNORMAL LOW (ref 35.0–47.0)
Hemoglobin: 10.7 g/dL — ABNORMAL LOW (ref 12.0–16.0)
MCH: 27.9 pg (ref 26.0–34.0)
MCHC: 33.7 g/dL (ref 32.0–36.0)
MCV: 82.8 fL (ref 80.0–100.0)
PLATELETS: 231 10*3/uL (ref 150–440)
RBC: 3.84 MIL/uL (ref 3.80–5.20)
RDW: 15.9 % — ABNORMAL HIGH (ref 11.5–14.5)
WBC: 4.6 10*3/uL (ref 3.6–11.0)

## 2016-03-27 LAB — HEPATIC FUNCTION PANEL
ALT: 8 U/L — ABNORMAL LOW (ref 14–54)
AST: 13 U/L — ABNORMAL LOW (ref 15–41)
Albumin: 2.5 g/dL — ABNORMAL LOW (ref 3.5–5.0)
Alkaline Phosphatase: 57 U/L (ref 38–126)
BILIRUBIN DIRECT: 0.1 mg/dL (ref 0.1–0.5)
BILIRUBIN INDIRECT: 0.8 mg/dL (ref 0.3–0.9)
Total Bilirubin: 0.9 mg/dL (ref 0.3–1.2)
Total Protein: 5.4 g/dL — ABNORMAL LOW (ref 6.5–8.1)

## 2016-03-27 LAB — TROPONIN I

## 2016-03-27 LAB — LIPASE, BLOOD

## 2016-03-27 MED ORDER — SODIUM CHLORIDE 0.9 % IV BOLUS (SEPSIS)
500.0000 mL | Freq: Once | INTRAVENOUS | Status: AC
  Administered 2016-03-27: 500 mL via INTRAVENOUS

## 2016-03-27 MED ORDER — BISACODYL 5 MG PO TBEC: 5.0000 mg | DELAYED_RELEASE_TABLET | Freq: Every day | ORAL | Status: DC | PRN

## 2016-03-27 MED ORDER — AMLODIPINE BESYLATE 5 MG PO TABS
5.0000 mg | ORAL_TABLET | Freq: Every day | ORAL | Status: DC
  Administered 2016-03-28 (×2): 5 mg via ORAL
  Filled 2016-03-27 (×2): qty 1

## 2016-03-27 MED ORDER — ONDANSETRON HCL 4 MG PO TABS
4.0000 mg | ORAL_TABLET | Freq: Two times a day (BID) | ORAL | Status: DC
  Administered 2016-03-28 – 2016-03-29 (×4): 4 mg via ORAL
  Filled 2016-03-27 (×4): qty 1

## 2016-03-27 MED ORDER — ACETAMINOPHEN 325 MG PO TABS: 650.0000 mg | ORAL_TABLET | Freq: Four times a day (QID) | ORAL | Status: DC | PRN

## 2016-03-27 MED ORDER — ONDANSETRON HCL 4 MG/2ML IJ SOLN: 4.0000 mg | Freq: Four times a day (QID) | INTRAMUSCULAR | Status: DC | PRN

## 2016-03-27 MED ORDER — APIXABAN 5 MG PO TABS
5.0000 mg | ORAL_TABLET | Freq: Two times a day (BID) | ORAL | Status: DC
  Administered 2016-03-28 – 2016-03-29 (×4): 5 mg via ORAL
  Filled 2016-03-27 (×4): qty 1

## 2016-03-27 MED ORDER — SODIUM CHLORIDE 0.9 % IV SOLN
1.0000 g | Freq: Two times a day (BID) | INTRAVENOUS | Status: DC
  Filled 2016-03-27 (×2): qty 1

## 2016-03-27 MED ORDER — DOCUSATE SODIUM 100 MG PO CAPS
100.0000 mg | ORAL_CAPSULE | Freq: Two times a day (BID) | ORAL | Status: DC
  Administered 2016-03-28 – 2016-03-29 (×4): 100 mg via ORAL
  Filled 2016-03-27 (×4): qty 1

## 2016-03-27 MED ORDER — TRAMADOL HCL 50 MG PO TABS: 50.0000 mg | ORAL_TABLET | Freq: Four times a day (QID) | ORAL | Status: DC | PRN

## 2016-03-27 MED ORDER — SODIUM CHLORIDE 0.9 % IV SOLN
INTRAVENOUS | Status: DC
  Administered 2016-03-27 – 2016-03-29 (×4): via INTRAVENOUS

## 2016-03-27 MED ORDER — INSULIN DETEMIR 100 UNIT/ML FLEXPEN: 15.0000 [IU] | PEN_INJECTOR | Freq: Every morning | SUBCUTANEOUS | Status: DC

## 2016-03-27 MED ORDER — SENNOSIDES-DOCUSATE SODIUM 8.6-50 MG PO TABS: 1.0000 | ORAL_TABLET | Freq: Every evening | ORAL | Status: DC | PRN

## 2016-03-27 MED ORDER — PANTOPRAZOLE SODIUM 40 MG PO TBEC
40.0000 mg | DELAYED_RELEASE_TABLET | Freq: Every day | ORAL | Status: DC
  Administered 2016-03-28 – 2016-03-29 (×3): 40 mg via ORAL
  Filled 2016-03-27 (×3): qty 1

## 2016-03-27 MED ORDER — INSULIN DETEMIR 100 UNIT/ML ~~LOC~~ SOLN
15.0000 [IU] | Freq: Every day | SUBCUTANEOUS | Status: DC
  Administered 2016-03-28 (×2): 15 [IU] via SUBCUTANEOUS
  Filled 2016-03-27 (×2): qty 0.15

## 2016-03-27 MED ORDER — ACETAMINOPHEN 325 MG PO TABS: 650.0000 mg | ORAL_TABLET | ORAL | Status: DC | PRN

## 2016-03-27 MED ORDER — MEROPENEM-SODIUM CHLORIDE 1 GM/50ML IV SOLR
1.0000 g | Freq: Two times a day (BID) | INTRAVENOUS | Status: DC
  Administered 2016-03-28 – 2016-03-29 (×4): 1 g via INTRAVENOUS
  Filled 2016-03-27 (×5): qty 50

## 2016-03-27 MED ORDER — ACETAMINOPHEN 650 MG RE SUPP: 650.0000 mg | Freq: Four times a day (QID) | RECTAL | Status: DC | PRN

## 2016-03-27 MED ORDER — TAMSULOSIN HCL 0.4 MG PO CAPS
0.4000 mg | ORAL_CAPSULE | Freq: Every day | ORAL | Status: DC
  Administered 2016-03-28 – 2016-03-29 (×3): 0.4 mg via ORAL
  Filled 2016-03-27 (×3): qty 1

## 2016-03-27 MED ORDER — ONDANSETRON HCL 4 MG/2ML IJ SOLN
4.0000 mg | Freq: Once | INTRAMUSCULAR | Status: AC
  Administered 2016-03-27: 4 mg via INTRAVENOUS

## 2016-03-27 MED ORDER — ONDANSETRON HCL 4 MG/2ML IJ SOLN
INTRAMUSCULAR | Status: AC
  Administered 2016-03-27: 4 mg via INTRAVENOUS
  Filled 2016-03-27: qty 2

## 2016-03-27 MED ORDER — ATORVASTATIN CALCIUM 20 MG PO TABS
20.0000 mg | ORAL_TABLET | Freq: Every day | ORAL | Status: DC
  Administered 2016-03-28 (×2): 20 mg via ORAL
  Filled 2016-03-27 (×2): qty 1

## 2016-03-27 MED ORDER — GABAPENTIN 300 MG PO CAPS
300.0000 mg | ORAL_CAPSULE | Freq: Every day | ORAL | Status: DC
  Administered 2016-03-28 (×2): 300 mg via ORAL
  Filled 2016-03-27 (×2): qty 1

## 2016-03-27 MED ORDER — ENOXAPARIN SODIUM 40 MG/0.4ML ~~LOC~~ SOLN: 40.0000 mg | SUBCUTANEOUS | Status: DC

## 2016-03-27 MED ORDER — ONDANSETRON HCL 4 MG PO TABS: 4.0000 mg | ORAL_TABLET | Freq: Four times a day (QID) | ORAL | Status: DC | PRN

## 2016-03-27 MED ORDER — GUAIFENESIN ER 600 MG PO TB12
600.0000 mg | ORAL_TABLET | Freq: Two times a day (BID) | ORAL | Status: DC
  Administered 2016-03-28 – 2016-03-29 (×4): 600 mg via ORAL
  Filled 2016-03-27 (×4): qty 1

## 2016-03-27 NOTE — ED Notes (Signed)
Pt informed this RN that she had been several episodes of emesis yesterday, none today

## 2016-03-27 NOTE — Consult Note (Signed)
Pharmacy Antibiotic Note  Sherry [name] is a 83 y.o. female admitted on 03/27/2016 with UTI.  Pharmacy has been consulted for meropenem dosing.  Plan: meropenem 1g q 12 hours  Height: 5\' 4"  (162.6 cm) Weight: 163 lb (73.9 kg) IBW/kg (Calculated) : 54.7  Temp (24hrs), Avg:97.9 F (36.6 C), Min:97.9 F (36.6 C), Max:97.9 F (36.6 C)   Recent Labs Lab 03/27/16 1340  WBC 4.6  CREATININE 0.96    Estimated Creatinine Clearance: 41.4 mL/min (by C-G formula based on SCr of 0.96 mg/dL).    No Known Allergies  Antimicrobials this admission: meropenem 3/19 >>  Pt was on levofloxacin op Finished a course of cipro for an enterobacter species UTI on 3/10  Dose adjustments this admission:   Microbiology results: 3/19 Ucx: UA: no bacteria, TNC WBC, RBC, moderate leukocytes  Thank you for allowing pharmacy to be a part of this patient's care.  Ramond Dial, Pharm.D, BCPS Clinical Pharmacist  03/27/2016 8:22 PM

## 2016-03-27 NOTE — ED Triage Notes (Signed)
Pt bib EMS from [facility] w/ c/o worsening PNA. Pt was dx in house w/ PNA 4 days ago and started on antibiotics.  Pt sts that she is not feeling any better, still CP and SOB.  Pt A/OCX4, resp even and unlabored.  Pt also c/o "sick to stomach" since Tuesday, denies n/v.

## 2016-03-27 NOTE — ED Provider Notes (Addendum)
[facility] Emergency Department Provider Note  ____________________________________________   I have reviewed the triage vital signs and the nursing notes.   HISTORY  Chief Complaint Pneumonia    HPI Sherry [name] is a 83 y.o. female who presents today complaining of nausea and vomiting. Patient is being treated for pneumonia. She states that her breathing is much better actually her cough is improved but she had some nausea last night and she wants to make sure that everything is okay. She denies fever or chills. She is on 2 L home oxygen at all times and she has had no difficulty with that. She's had no need to increase her oxygen.  The patient overall is feeling better but she did do some vomiting last night which raised concern. She denies having significant diarrhea and she states she's had no melena rectum or hematemesis   Past Medical History:  Diagnosis Date  . Diabetes mellitus without complication ([place])   . Hyperlipidemia   . Hypertension     Patient Active Problem List   Diagnosis Date Noted  . Advance care planning   . UTI (urinary tract infection) 03/12/2016  . Chest pain 03/09/2016  . PE (pulmonary thromboembolism) ([place]) 02/26/2016  . Diabetes mellitus, type 2 ([place]) 02/26/2016  . Anemia 02/26/2016  . Chronic kidney disease (CKD), stage III (moderate) 02/26/2016  . Essential hypertension 02/26/2016  . Goals of care, counseling/discussion   . Adult failure to thrive syndrome   . DNR (do not resuscitate)   . Palliative care by specialist   . Sepsis ([place]) 11/26/2014    Past Surgical History:  Procedure Laterality Date  . BACK SURGERY    . SHOULDER SURGERY      Prior to Admission medications   Medication Sig Start Date End Date Taking? Authorizing Provider  acetaminophen (TYLENOL) 325 MG tablet Take 650 mg by mouth every 4 (four) hours as needed for mild pain or moderate pain.    Historical Provider, MD  amLODipine (NORVASC) 5 MG  tablet Take 5 mg by mouth daily.    Historical Provider, MD  apixaban (ELIQUIS) 5 MG TABS tablet Take 1 tablet (5 mg total) by mouth 2 (two) times daily. 03/08/16   Erline Hau, MD  atorvastatin (LIPITOR) 20 MG tablet Take 1 tablet by mouth at bedtime.    Historical Provider, MD  ciprofloxacin (CIPRO) 500 MG tablet Take 1 tablet (500 mg total) by mouth 2 (two) times daily. 03/13/16   Fritzi Mandes, MD  feeding supplement, ENSURE ENLIVE, (ENSURE ENLIVE) LIQD Take 237 mLs by mouth 3 (three) times daily with meals. 02/07/16   Demetrios Loll, MD  gabapentin (NEURONTIN) 300 MG capsule Take 1 capsule by mouth at bedtime.    Historical Provider, MD  Insulin Detemir (LEVEMIR) 100 UNIT/ML Pen Inject 13 Units into the skin every morning.    Historical Provider, MD  lisinopril-hydrochlorothiazide (PRINZIDE,ZESTORETIC) 20-12.5 MG tablet Take 2 tablets by mouth daily.     Historical Provider, MD  meloxicam (MOBIC) 7.5 MG tablet Take 7.5 mg by mouth daily.    Historical Provider, MD  ondansetron (ZOFRAN) 4 MG tablet Take 4 mg by mouth 2 (two) times daily. *May take every 8 hours as needed for nausea and vomiting    Historical Provider, MD  pantoprazole (PROTONIX) 40 MG tablet Take 1 tablet (40 mg total) by mouth daily. 03/13/16   Fritzi Mandes, MD  potassium chloride SA (K-DUR,KLOR-CON) 20 MEQ tablet Take 1 tablet (20 mEq total) by  mouth 2 (two) times daily. For 4 days Patient not taking: Reported on 03/09/2016 03/01/16   Erline Hau, MD  tamsulosin (FLOMAX) 0.4 MG CAPS capsule Take 1 capsule (0.4 mg total) by mouth daily. 03/14/16   Fritzi Mandes, MD    Allergies Patient has no known allergies.  No family history on file.  Social History Social History  Substance Use Topics  . Smoking status: Never Smoker  . Smokeless tobacco: Never Used  . Alcohol use No    Review of Systems Constitutional: No fever/chills Eyes: No visual changes. ENT: No sore throat. No stiff neck no neck  pain Cardiovascular: Denies chest pain. Respiratory: Denies shortness of breath. Gastrointestinal:   Positive vomiting.  No diarrhea.  No constipation. Genitourinary: Negative for dysuria. Musculoskeletal: Negative lower extremity swelling Skin: Negative for rash. Neurological: Negative for severe headaches, focal weakness or numbness. 10-point ROS otherwise negative.  ____________________________________________   PHYSICAL EXAM:  VITAL SIGNS: ED Triage Vitals  Enc Vitals Group     BP 03/27/16 1339 127/64     Pulse Rate 03/27/16 1339 83     Resp 03/27/16 1339 20     Temp 03/27/16 1339 97.9 F (36.6 C)     Temp Source 03/27/16 1339 Oral     SpO2 03/27/16 1339 99 %     Weight 03/27/16 1340 163 lb (73.9 kg)     Height 03/27/16 1340 5\' 4"  (1.626 m)     Head Circumference --      Peak Flow --      Pain Score 03/27/16 1340 5     Pain Loc --      Pain Edu? --      Excl. in [place]? --     Constitutional: Alert and oriented. Well appearing and in no acute distress. Eyes: Conjunctivae are normal. PERRL. EOMI. Head: Atraumatic. Nose: No congestion/rhinnorhea. Mouth/Throat: Mucous membranes are moist.  Oropharynx non-erythematous. Neck: No stridor.   Nontender with no meningismus Cardiovascular: Normal rate, regular rhythm. Grossly normal heart sounds.  Good peripheral circulation. Respiratory: Normal respiratory effort.  No retractions. Lungs CTAB. Abdominal: Soft and nontender. No distention. No guarding no rebound Back:  There is no focal tenderness or step off.  there is no midline tenderness there are no lesions noted. there is no CVA tenderness Musculoskeletal: No lower extremity tenderness, no upper extremity tenderness. No joint effusions, no DVT signs strong distal pulses no edema Neurologic:  Normal speech and language. No gross focal neurologic deficits are appreciated.  Skin:  Skin is warm, dry and intact. No rash noted. Psychiatric: Mood and affect are normal. Speech and  behavior are normal.  ____________________________________________   LABS (all labs ordered are listed, but only abnormal results are displayed)  Labs Reviewed  BASIC METABOLIC PANEL - Abnormal; Notable for the following:       Result Value   Sodium 133 (*)    Potassium 3.3 (*)    Chloride 94 (*)    Glucose, Bld 134 (*)    Calcium 8.1 (*)    GFR calc non Af Amer 52 (*)    All other components within normal limits  CBC - Abnormal; Notable for the following:    Hemoglobin 10.7 (*)    HCT 31.8 (*)    RDW 15.9 (*)    All other components within normal limits  HEPATIC FUNCTION PANEL - Abnormal; Notable for the following:    Total Protein 5.4 (*)    Albumin 2.5 (*)  AST 13 (*)    ALT 8 (*)    All other components within normal limits  LIPASE, BLOOD - Abnormal; Notable for the following:    Lipase <10 (*)    All other components within normal limits  TROPONIN I  URINALYSIS, COMPLETE (UACMP) WITH MICROSCOPIC   ____________________________________________  EKG  I personally interpreted any EKGs ordered by me or triage Past rhythm rate 87 bpm, left bundle branch block noted first degree AV block noted. ____________________________________________  [id number]  I reviewed any imaging ordered by me or triage that were performed during my shift and, if possible, patient and/or family made aware of any abnormal findings. ____________________________________________   PROCEDURES  Procedure(s) performed: None  Procedures  Critical Care performed: None  ____________________________________________   INITIAL IMPRESSION / ASSESSMENT AND PLAN / ED COURSE  Pertinent labs & imaging results that were available during my care of the patient were reviewed by me and considered in my medical decision making (see chart for details). Very asymptomatic here with very good waveform on her pulse ox showing high oxygen saturation on home oxygen. X-ray is greatly improved. EKG is  unchanged from baseline, blood work is quite reassuring with no elevated white count. Pressures are reassuring. She did have some vomiting. She was concerned that maybe this was making her pneumonia worse but clinically and subjectively she is actually doing much better. We will try by mouth challenge and see if we can get her safely home  ----------------------------------------- 4:33 PM on 03/27/2016 -----------------------------------------  Administration able to drink but still feels nauseated with the Zofran and reassess  ----------------------------------------- 7:41 PM on 03/27/2016 -----------------------------------------  Urinalysis from her Foley shows a ongoing urinary tract infection. Will change the Foley. Patient already on Levaquin. Unfortunately this is one of the few medications to which her last urinary tract infection was sensitive. We will, after discussion with Dr. Posey Pronto, hold and about a change pending culture.      ____________________________________________   FINAL CLINICAL IMPRESSION(S) / ED DIAGNOSES  Final diagnoses:  None      This chart was dictated using voice recognition software.  Despite best efforts to proofread,  errors can occur which can change meaning.      Schuyler Amor, MD 03/27/16 1620    Schuyler Amor, MD 03/27/16 [place], MD 03/27/16 [place], MD 03/27/16 1007

## 2016-03-27 NOTE — ED Notes (Signed)
Pt able to eat/drink w/o emesis

## 2016-03-27 NOTE — H&P (Signed)
[facility] [place] NAME: Sherry [name]    MR#:  [id number]  DATE OF BIRTH:  04/10/1927  DATE OF ADMISSION:  03/27/2016  PRIMARY CARE PHYSICIAN: [place]   REQUESTING/REFERRING PHYSICIAN: Dr. Burlene Arnt  CHIEF COMPLAINT:   Nausea vomiting for by mouth intake for 3-4 days patient is from [facility] No family in the room HISTORY OF PRESENT ILLNESS:  Sherry [name]  is a 83 y.o. female with a known history of Diabetes, hyperlipidemia, hypertension and history of pulmonary embolism who is on oral anticoagulation comes in the emergency room with nausea vomiting poor by mouth intake for 3-4 days at [facility]. She has some cough nonproductive and was started on Levaquin. Her end date for Levaquin was 04/01/2016. Patient's chest x-ray looks better. She had thoracentesis done during last admission 03/13/2016 and transudative fluid about 400 cc was removed. Patient was also sent home on Foley catheter secondary to urinary retention she was supposed to follow up with neurology as outpatient however has not been able to do so. She also has possible right renal mass which was going to be followed by urology as well.  UA shows urinary tract infection. Patient will be started on IV meropenem given her sensitivity results from last admission which showed Enterobacter.  PAST MEDICAL HISTORY:   Past Medical History:  Diagnosis Date  . Diabetes mellitus without complication ([place])   . Hyperlipidemia   . Hypertension     PAST SURGICAL HISTOIRY:   Past Surgical History:  Procedure Laterality Date  . BACK SURGERY    . SHOULDER SURGERY      SOCIAL HISTORY:   Social History  Substance Use Topics  . Smoking status: Never Smoker  . Smokeless tobacco: Never Used  . Alcohol use No    FAMILY HISTORY:  No family history on file.  DRUG ALLERGIES:  No Known Allergies  REVIEW OF SYSTEMS:  Review of Systems   Constitutional: Negative for chills, fever and weight loss.  HENT: Negative for ear discharge, ear pain and nosebleeds.   Eyes: Negative for blurred vision, pain and discharge.  Respiratory: Negative for sputum production, shortness of breath, wheezing and stridor.   Cardiovascular: Negative for chest pain, palpitations, orthopnea and PND.  Gastrointestinal: Positive for nausea and vomiting. Negative for abdominal pain and diarrhea.  Genitourinary: Negative for frequency and urgency.  Musculoskeletal: Negative for back pain and joint pain.  Neurological: Positive for weakness. Negative for sensory change, speech change and focal weakness.  Psychiatric/Behavioral: Negative for depression and hallucinations. The patient is not nervous/anxious.      MEDICATIONS AT HOME:   Prior to Admission medications   Medication Sig Start Date End Date Taking? Authorizing Provider  acetaminophen (TYLENOL) 325 MG tablet Take 650 mg by mouth every 4 (four) hours as needed for mild pain or moderate pain.   Yes Historical Provider, MD  amLODipine (NORVASC) 5 MG tablet Take 5 mg by mouth daily.   Yes Historical Provider, MD  apixaban (ELIQUIS) 5 MG TABS tablet Take 1 tablet (5 mg total) by mouth 2 (two) times daily. 03/08/16  Yes Erline Hau, MD  atorvastatin (LIPITOR) 20 MG tablet Take 1 tablet by mouth at bedtime.   Yes Historical Provider, MD  gabapentin (NEURONTIN) 300 MG capsule Take 1 capsule by mouth at bedtime.   Yes Historical Provider, MD  guaiFENesin (MUCINEX) 600 MG 12 hr tablet Take by mouth 2 (two) times  daily.   Yes Historical Provider, MD  Insulin Detemir (LEVEMIR) 100 UNIT/ML Pen Inject 15 Units into the skin every morning.    Yes Historical Provider, MD  lisinopril-hydrochlorothiazide (PRINZIDE,ZESTORETIC) 20-12.5 MG tablet Take 2 tablets by mouth daily.    Yes Historical Provider, MD  ondansetron (ZOFRAN) 4 MG tablet Take 4 mg by mouth 2 (two) times daily. *May take every 8 hours  as needed for nausea and vomiting   Yes Historical Provider, MD  pantoprazole (PROTONIX) 40 MG tablet Take 1 tablet (40 mg total) by mouth daily. 03/13/16  Yes Fritzi Mandes, MD  tamsulosin (FLOMAX) 0.4 MG CAPS capsule Take 1 capsule (0.4 mg total) by mouth daily. 03/14/16  Yes Fritzi Mandes, MD  meloxicam (MOBIC) 7.5 MG tablet Take 7.5 mg by mouth daily.    Historical Provider, MD      VITAL SIGNS:  Blood pressure (!) 130/55, pulse (!) 46, temperature 97.9 F (36.6 C), temperature source Oral, resp. rate (!) 24, height 5\' 4"  (1.626 m), weight 73.9 kg (163 lb), SpO2 96 %.  PHYSICAL EXAMINATION:  GENERAL:  83 y.o.-year-old patient lying in the bed with no acute distress. Appears chronically ill EYES: Pupils equal, round, reactive to light and accommodation. No scleral icterus. Extraocular muscles intact.  HEENT: Head atraumatic, normocephalic. Oropharynx and nasopharynx clear.  NECK:  Supple, no jugular venous distention. No thyroid enlargement, no tenderness.  LUNGS: Normal breath sounds bilaterally, no wheezing, rales,rhonchi or crepitation. No use of accessory muscles of respiration.  CARDIOVASCULAR: S1, S2 normal. No murmurs, rubs, or gallops.  ABDOMEN: Soft, nontender, nondistended. Bowel sounds present. No organomegaly or mass. Indwelling Foley catheter since 03/13/2016 EXTREMITIES: No pedal edema, cyanosis, or clubbing.  NEUROLOGIC: Cranial nerves II through XII are intact. Muscle strength 5/5 in all extremities. Sensation intact. Gait not checked.  PSYCHIATRIC: The patient is alert and oriented x 3.  SKIN: No obvious rash, lesion, or ulcer.   LABORATORY PANEL:   CBC  Recent Labs Lab 03/27/16 1340  WBC 4.6  HGB 10.7*  HCT 31.8*  PLT 231   ------------------------------------------------------------------------------------------------------------------  Chemistries   Recent Labs Lab 03/27/16 1340  NA 133*  K 3.3*  CL 94*  CO2 29  GLUCOSE 134*  BUN 16  CREATININE 0.96   CALCIUM 8.1*  AST 13*  ALT 8*  ALKPHOS 57  BILITOT 0.9   ------------------------------------------------------------------------------------------------------------------  Cardiac Enzymes  Recent Labs Lab 03/27/16 1340  TROPONINI <0.03   ------------------------------------------------------------------------------------------------------------------  RADIOLOGY:  Dg Chest 2 View  Result Date: 03/27/2016 CLINICAL DATA:  Pneumonia. Cough. Chest pain and shortness of breath. EXAM: CHEST  2 VIEW COMPARISON:  Chest x-rays dated 03/10/2016, 03/09/2016 and 03/07/2016 and chest CT dated 03/09/2016 FINDINGS: The heart is at the upper limits of normal in size. Pulmonary vascularity is normal. There has been marked decrease in the right pleural effusion with a small to moderate residual right effusion. There is a tiny residual left effusion. No consolidative infiltrates. No acute bone abnormality. Calcification in the arch of the aorta. IMPRESSION: Marked improvement in the right pleural effusion. Almost complete resolution of left pleural effusion. No new abnormalities. Electronically Signed   By: Lorriane Shire M.D.   On: 03/27/2016 14:52    EKG:    IMPRESSION AND PLAN:   Sherry [name]  is a 83 y.o. female with a known history of Diabetes, hyperlipidemia, hypertension and history of pulmonary embolism who is on oral anticoagulation comes in the emergency room with nausea vomiting poor by mouth intake for  3-4 days at [facility].  1. Intractable nausea vomiting secondary to recurrent UTI in the setting of indwelling Foley catheter that was placed on 03/13/2016 when patient was discharged to [facility] start her on IV meropenem -ID consult -Follow blood cultures  2. Right renal mass possible with urinary retention and indwelling Foley catheter (since 03/13/16) -Patient was supposed to see Dr.  Hollice Espy as outpatient -Consider urology consult  3. Diabetes continue home  meds and sliding scale  4. History of PE on oral anticoagulation will continue that  5. DVT prophylaxis on oral anticoagulation   All the records are reviewed and case discussed with ED provider. Management plans discussed with the patient, family and they are in agreement.  CODE STATUS: Full  TOTAL TIME TAKING CARE OF THIS PATIENT: 50 minutes.    Sherry [name] M.D on 03/27/2016 at 9:07 PM  Between 7am to 6pm - Pager - [phone]  After 6pm go to [email or web address] - password [email or web address] [facility]  [facility]  Office  [phone]  CC: Primary care physician; [facility]

## 2016-03-27 NOTE — ED Notes (Signed)
Pt given graham crackers and diet coke.

## 2016-03-28 ENCOUNTER — Telehealth: Payer: Self-pay

## 2016-03-28 DIAGNOSIS — R338 Other retention of urine: Secondary | ICD-10-CM

## 2016-03-28 DIAGNOSIS — N2889 Other specified disorders of kidney and ureter: Secondary | ICD-10-CM

## 2016-03-28 LAB — GLUCOSE, CAPILLARY
GLUCOSE-CAPILLARY: 83 mg/dL (ref 65–99)
GLUCOSE-CAPILLARY: 123 mg/dL — AB (ref 65–99)
GLUCOSE-CAPILLARY: 59 mg/dL — AB (ref 65–99)
GLUCOSE-CAPILLARY: 45 mg/dL — AB (ref 65–99)
Glucose-Capillary: 95 mg/dL (ref 65–99)
Glucose-Capillary: 49 mg/dL — ABNORMAL LOW (ref 65–99)
Glucose-Capillary: 64 mg/dL — ABNORMAL LOW (ref 65–99)
Glucose-Capillary: 88 mg/dL (ref 65–99)

## 2016-03-28 LAB — MRSA PCR SCREENING: MRSA by PCR: NEGATIVE

## 2016-03-28 MED ORDER — INSULIN ASPART 100 UNIT/ML ~~LOC~~ SOLN: 0.0000 [IU] | Freq: Every day | SUBCUTANEOUS | Status: DC

## 2016-03-28 MED ORDER — POTASSIUM CHLORIDE CRYS ER 20 MEQ PO TBCR
40.0000 meq | EXTENDED_RELEASE_TABLET | Freq: Once | ORAL | Status: AC
  Administered 2016-03-28: 09:00:00 40 meq via ORAL
  Filled 2016-03-28: qty 2

## 2016-03-28 MED ORDER — INSULIN ASPART 100 UNIT/ML ~~LOC~~ SOLN
0.0000 [IU] | Freq: Three times a day (TID) | SUBCUTANEOUS | Status: DC
  Administered 2016-03-29: 2 [IU] via SUBCUTANEOUS
  Filled 2016-03-28: qty 2

## 2016-03-28 NOTE — Clinical Social Work Note (Signed)
Clinical Social Work Assessment  Patient Details  Name: Sherry [name] MRN: [id number] Date of Birth: 1927-11-18  Date of referral:  03/28/16               Reason for consult:  Facility Placement, Discharge Planning                Permission sought to share information with:  Chartered certified accountant granted to share information::  Yes, Verbal Permission Granted  Name::      [place]::   [place]   Relationship::     Contact Information:     Housing/Transportation Living arrangements for the past 2 months:  [place] of Information:  Patient Patient Interpreter Needed:  None Criminal Activity/Legal Involvement Pertinent to Current Situation/Hospitalization:  No - Comment as needed Significant Relationships:  Adult Children Lives with:  Adult Children (Son Sherry [name]) Do you feel safe going back to the place where you live?  Yes Need for family participation in patient care:  Yes (Comment)  Care giving concerns:  Patient is a short term rehab resident at [facility].    Social Worker assessment / plan:  Social work Theatre manager received consult. PT has not assessed patient at this time. Social work Theatre manager met with patient alone at bedside. Patient was alert and oriented x4 and was watching TV. Social work inter introduced herself and explained role of social work department. Per patient, she has been at [facility] for about a week now and is receiving rehab. Patient has two children, Sherry [name] and Sherry [name] that live in the area. Patient lives with her son, Sherry [name] in [place]. Patient does not have an HPOA at this time. Patient expressed that she is on chronic oxygen and uses a walker at baseline. Clinical Education officer, museum (CSW) spoke to [employer], Development worker, international aid at [employer]. Neoma Laming confirmed that patient can return when medically stable for discharge. Patient is agreeable to return to [facility] when medically  stable for discharge. Social work Theatre manager will continue to assist and follow as needed.   Fl2 completed and faxed out.   Employment status:  Unemployed Nurse, adult PT Recommendations:  Not assessed at this time Information / Referral to community resources:  [place]  Patient/Family's Response to care:  Patient is agreeable to return to [facility] when medically stable for discharge.   Patient/Family's Understanding of and Emotional Response to Diagnosis, Current Treatment, and Prognosis:  Patient was pleasant and thanked social work Theatre manager for visiting.   Emotional Assessment Appearance:  Appears stated age Attitude/Demeanor/Rapport:    Affect (typically observed):  Accepting, Adaptable, Appropriate Orientation:  Oriented to Self, Oriented to Place, Oriented to  Time, Oriented to Situation Alcohol / Substance use:  Not Applicable Psych involvement (Current and /or in the community):  No (Comment)  Discharge Needs  Concerns to be addressed:  Basic Needs Readmission within the last 30 days:  No Current discharge risk:  Chronically ill, Dependent with Mobility Barriers to Discharge:  Continued Medical Work up   [employer], [place] Work 03/28/2016, 4:29 PM

## 2016-03-28 NOTE — Consult Note (Signed)
[facility] Infectious Disease     Reason for Consult: UTI  Referring Physician: Dolores Frame Date of Admission:  03/27/2016   Active Problems:   Nausea & vomiting   HPI: Sherry [name] is a 83 y.o. female seen in ED on 03/27/16 and admitted for c/o x3-4 days of nausea and vomiting, and poor by mouth intake. She was diagnosed at [facility] with PNA on 3/15, started on Levaquin, with end date of 04/01/2016. Patient had foley catheter placed on 03/13/16 for urinary retention and was supposed to follow up with urology outpatient for possible right renal mass. However, the patient returned to ED on 3/19 prior to her follow up appointment. UTI was found on UA with TNTC WBC on 3/19 and treated with Meropenem, due to sensitivity of enterobacter from last admission.   Patient is currently feeling improved from admission. She experienced one episode of nausea this morning, since resolved. Denies fever, chills, vomiting, abd pain, CP, and SOB. Has not yet eaten afternoon meal.   Past Medical History:  Diagnosis Date  . Diabetes mellitus without complication ([place])   . Hyperlipidemia   . Hypertension    Past Surgical History:  Procedure Laterality Date  . BACK SURGERY    . SHOULDER SURGERY     Social History  Substance Use Topics  . Smoking status: Never Smoker  . Smokeless tobacco: Never Used  . Alcohol use No   History reviewed. No pertinent family history.  Allergies: No Known Allergies  Current antibiotics: Antibiotics Given (last 72 hours)    Date/Time Action Medication Dose Rate   03/28/16 0005 Given   meropenem (MERREM) IVPB SOLR 1 g 1 g 100 mL/hr   03/28/16 0848 Given   meropenem (MERREM) IVPB SOLR 1 g 1 g 100 mL/hr      MEDICATIONS: . amLODipine  5 mg Oral Daily  . apixaban  5 mg Oral BID  . atorvastatin  20 mg Oral QHS  . docusate sodium  100 mg Oral BID  . gabapentin  300 mg Oral QHS  . guaiFENesin  600 mg Oral BID  . insulin aspart  0-5 Units Subcutaneous QHS  .  insulin aspart  0-9 Units Subcutaneous TID WC  . meropenem  1 g Intravenous Q12H  . ondansetron  4 mg Oral BID  . pantoprazole  40 mg Oral Daily  . tamsulosin  0.4 mg Oral Daily    Review of Systems - 11 systems reviewed and negative per HPI   OBJECTIVE: Temp:  [97.4 F (36.3 C)-98.3 F (36.8 C)] 98.3 F (36.8 C) (03/20 0849) Pulse Rate:  [41-101] 86 (03/20 0849) Resp:  [13-24] 16 (03/20 0849) BP: (113-154)/(31-80) 118/65 (03/20 0849) SpO2:  [96 %-100 %] 98 % (03/20 0849) Weight:  [68.4 kg (150 lb 14.4 oz)] 68.4 kg (150 lb 14.4 oz) (03/19 2300)  General: Thin elderly appearing female in no acute distress. Sleeping comfortably upon entry into exam room. Skin: No rashes noted Neck: No swelling, FROM CV: RRR, no murmurs, rubs, or gallops. No carotid bruits. Pulmonary: CTAB, no wheezes, rales, rhonchi. Not in acute respiratory distress, no accessory muscle use. Abdominal: Soft, non tender, non distended. MSK: No LE edema Neuro: Alert and conversational but drowsy Psych: Appropriate mood and affect  LABS: Results for orders placed or performed during the hospital encounter of 03/27/16 (from the past 48 hour(s))  Basic metabolic panel     Status: Abnormal   Collection Time: 03/27/16  1:40 PM  Result Value  Ref Range   Sodium 133 (L) 135 - 145 mmol/L   Potassium 3.3 (L) 3.5 - 5.1 mmol/L   Chloride 94 (L) 101 - 111 mmol/L   CO2 29 22 - 32 mmol/L   Glucose, Bld 134 (H) 65 - 99 mg/dL   BUN 16 6 - 20 mg/dL   Creatinine, Ser 0.96 0.44 - 1.00 mg/dL   Calcium 8.1 (L) 8.9 - 10.3 mg/dL   GFR calc non Af Amer 52 (L) >60 mL/min   GFR calc Af Amer >60 >60 mL/min    Comment: (NOTE) The eGFR has been calculated using the CKD EPI equation. This calculation has not been validated in all clinical situations. eGFR's persistently <60 mL/min signify possible Chronic Kidney Disease.    Anion gap 10 5 - 15  CBC     Status: Abnormal   Collection Time: 03/27/16  1:40 PM  Result Value Ref Range    WBC 4.6 3.6 - 11.0 K/uL   RBC 3.84 3.80 - 5.20 MIL/uL   Hemoglobin 10.7 (L) 12.0 - 16.0 g/dL   HCT 31.8 (L) 35.0 - 47.0 %   MCV 82.8 80.0 - 100.0 fL   MCH 27.9 26.0 - 34.0 pg   MCHC 33.7 32.0 - 36.0 g/dL   RDW 15.9 (H) 11.5 - 14.5 %   Platelets 231 150 - 440 K/uL  Hepatic function panel     Status: Abnormal   Collection Time: 03/27/16  1:40 PM  Result Value Ref Range   Total Protein 5.4 (L) 6.5 - 8.1 g/dL   Albumin 2.5 (L) 3.5 - 5.0 g/dL   AST 13 (L) 15 - 41 U/L   ALT 8 (L) 14 - 54 U/L   Alkaline Phosphatase 57 38 - 126 U/L   Total Bilirubin 0.9 0.3 - 1.2 mg/dL   Bilirubin, Direct 0.1 0.1 - 0.5 mg/dL   Indirect Bilirubin 0.8 0.3 - 0.9 mg/dL  Lipase, blood     Status: Abnormal   Collection Time: 03/27/16  1:40 PM  Result Value Ref Range   Lipase <10 (L) 11 - 51 U/L  Troponin I     Status: None   Collection Time: 03/27/16  1:40 PM  Result Value Ref Range   Troponin I <0.03 <0.03 ng/mL  Urinalysis, Complete w Microscopic     Status: Abnormal   Collection Time: 03/27/16  6:20 PM  Result Value Ref Range   Color, Urine YELLOW (A) YELLOW   APPearance HAZY (A) CLEAR   Specific Gravity, Urine 1.015 1.005 - 1.030   pH 5.0 5.0 - 8.0   Glucose, UA NEGATIVE NEGATIVE mg/dL   Hgb urine dipstick MODERATE (A) NEGATIVE   Bilirubin Urine NEGATIVE NEGATIVE   Ketones, ur 20 (A) NEGATIVE mg/dL   Protein, ur NEGATIVE NEGATIVE mg/dL   Nitrite NEGATIVE NEGATIVE   Leukocytes, UA MODERATE (A) NEGATIVE   RBC / HPF TOO NUMEROUS TO COUNT 0 - 5 RBC/hpf   WBC, UA TOO NUMEROUS TO COUNT 0 - 5 WBC/hpf   Bacteria, UA NONE SEEN NONE SEEN   Squamous Epithelial / LPF 0-5 (A) NONE SEEN   Mucous PRESENT    Budding Yeast PRESENT    Hyaline Casts, UA PRESENT    Non Squamous Epithelial 0-5 (A) NONE SEEN  Glucose, capillary     Status: Abnormal   Collection Time: 03/27/16 10:47 PM  Result Value Ref Range   Glucose-Capillary 179 (H) 65 - 99 mg/dL  MRSA PCR Screening     Status: None  Collection Time:  03/27/16 11:06 PM  Result Value Ref Range   MRSA by PCR NEGATIVE NEGATIVE    Comment:        The GeneXpert MRSA Assay (FDA approved for NASAL specimens only), is one component of a comprehensive MRSA colonization surveillance program. It is not intended to diagnose MRSA infection nor to guide or monitor treatment for MRSA infections.   Glucose, capillary     Status: None   Collection Time: 03/28/16  7:18 AM  Result Value Ref Range   Glucose-Capillary 83 65 - 99 mg/dL  Glucose, capillary     Status: Abnormal   Collection Time: 03/28/16 12:02 PM  Result Value Ref Range   Glucose-Capillary 123 (H) 65 - 99 mg/dL   No components found for: ESR, C REACTIVE PROTEIN MICRO: Recent Results (from the past 720 hour(s))  Blood Culture (routine x 2)     Status: None   Collection Time: 03/09/16  3:11 PM  Result Value Ref Range Status   Specimen Description BLOOD RIGHT AC  Final   Special Requests BOTTLES DRAWN AEROBIC AND ANAEROBIC BCAV  Final   Culture NO GROWTH 5 DAYS  Final   Report Status 03/14/2016 FINAL  Final  Blood Culture (routine x 2)     Status: None   Collection Time: 03/09/16  3:40 PM  Result Value Ref Range Status   Specimen Description BLOOD RIGHT AC  Final   Special Requests BOTTLES DRAWN AEROBIC AND ANAEROBIC BCAV  Final   Culture NO GROWTH 5 DAYS  Final   Report Status 03/14/2016 FINAL  Final  Body fluid culture     Status: None   Collection Time: 03/10/16 10:45 AM  Result Value Ref Range Status   Specimen Description PLEURAL  Final   Special Requests NONE  Final   Gram Stain   Final    ABUNDANT WBC PRESENT, PREDOMINANTLY MONONUCLEAR NO ORGANISMS SEEN    Culture   Final    NO GROWTH 3 DAYS Performed at [facility], [address] [place], [place] [address]    Report Status 03/14/2016 FINAL  Final  Urine culture     Status: Abnormal   Collection Time: 03/10/16 12:16 PM  Result Value Ref Range Status   Specimen Description URINE, RANDOM  Final    Special Requests NONE  Final   Culture >=100,000 COLONIES/mL ENTEROBACTER SPECIES (A)  Final   Report Status 03/12/2016 FINAL  Final   Organism ID, Bacteria ENTEROBACTER SPECIES (A)  Final      Susceptibility   Enterobacter species - MIC*    CEFAZOLIN >=64 RESISTANT Resistant     CEFTRIAXONE >=64 RESISTANT Resistant     CIPROFLOXACIN <=0.25 SENSITIVE Sensitive     GENTAMICIN <=1 SENSITIVE Sensitive     IMIPENEM 1 SENSITIVE Sensitive     NITROFURANTOIN 64 INTERMEDIATE Intermediate     TRIMETH/SULFA <=20 SENSITIVE Sensitive     PIP/TAZO >=128 RESISTANT Resistant     * >=100,000 COLONIES/mL ENTEROBACTER SPECIES  MRSA PCR Screening     Status: None   Collection Time: 03/27/16 11:06 PM  Result Value Ref Range Status   MRSA by PCR NEGATIVE NEGATIVE Final    Comment:        The GeneXpert MRSA Assay (FDA approved for NASAL specimens only), is one component of a comprehensive MRSA colonization surveillance program. It is not intended to diagnose MRSA infection nor to guide or monitor treatment for MRSA infections.     IMAGING: Dg Chest  1 View  Result Date: 03/10/2016 CLINICAL DATA:  Status post right thoracentesis. EXAM: CHEST 1 VIEW COMPARISON:  03/09/2016 FINDINGS: No significant residual right pleural fluid volume after thoracentesis. No pneumothorax. Residual atelectasis remains in both lower lungs, right greater than left. No pulmonary edema. The heart size is stable. IMPRESSION: No significant residual right pleural fluid after thoracentesis. No pneumothorax is seen. Both lower lobes demonstrate atelectasis, right greater than left. Electronically Signed   By: Aletta Edouard M.D.   On: 03/10/2016 11:18   Dg Chest 2 View  Result Date: 03/27/2016 CLINICAL DATA:  Pneumonia. Cough. Chest pain and shortness of breath. EXAM: CHEST  2 VIEW COMPARISON:  Chest x-rays dated 03/10/2016, 03/09/2016 and 03/07/2016 and chest CT dated 03/09/2016 FINDINGS: The heart is at the upper limits of  normal in size. Pulmonary vascularity is normal. There has been marked decrease in the right pleural effusion with a small to moderate residual right effusion. There is a tiny residual left effusion. No consolidative infiltrates. No acute bone abnormality. Calcification in the arch of the aorta. IMPRESSION: Marked improvement in the right pleural effusion. Almost complete resolution of left pleural effusion. No new abnormalities. Electronically Signed   By: Lorriane Shire M.D.   On: 03/27/2016 14:52   Dg Chest 2 View  Result Date: 03/07/2016 CLINICAL DATA:  Hyperglycemia, dyspnea and weakness. Diabetes and hypertension. History of recent pulmonary embolus. EXAM: CHEST  2 VIEW COMPARISON:  February 26, 2016 chest CT and CXR FINDINGS: Low lung volumes are noted on current exam. Stable cardiomegaly with aortic atherosclerosis. Moderate right effusion with adjacent compressive atelectasis without significant change allowing for low lung volumes. It appears slightly greater on the frontal projection does smaller on the lateral since previous. Small left pleural effusion. No overt pulmonary edema or pneumothorax. ACDF of the cervical spine. Osteoarthritis of both glenohumeral and AC joints. Chronic stable degenerate change of the dorsal spine. IMPRESSION: 1. Low lung volumes on current exam with chronic moderate right and small left pleural effusions. Likely adjacent to compressive atelectasis due to the pleural effusions bilaterally. 2. Aortic atherosclerosis. Electronically Signed   By: Ashley Royalty M.D.   On: 03/07/2016 17:35   Ct Angio Chest Pe W Or Wo Contrast  Result Date: 03/09/2016 CLINICAL DATA:  Left-sided chest pain. Pain to palpation along the left abdomen. EXAM: CT ANGIOGRAPHY CHEST CT ABDOMEN AND PELVIS WITH CONTRAST TECHNIQUE: Multidetector CT imaging of the chest was performed using the standard protocol during bolus administration of intravenous contrast. Multiplanar CT image reconstructions and  MIPs were obtained to evaluate the vascular anatomy. Multidetector CT imaging of the abdomen and pelvis was performed using the standard protocol during bolus administration of intravenous contrast. CONTRAST:  75 cc Isovue 370 intravenous COMPARISON:  02/26/2016 FINDINGS: CTA CHEST FINDINGS Cardiovascular: Borderline cardiomegaly. Question small volume pericardial fluid posteriorly. Satisfactory opacification of the pulmonary arteries. Significant decrease in pulmonary artery clot that was seen previously. No web or new embolus identified. Normal right to left ventricle ratio. Atherosclerosis, including the coronary arteries. No acute aortic finding. Mediastinum/Nodes: Sliding hiatal hernia with prominent thickening an mucosal hyperenhancement of the lower esophagus. Superiorly the esophagus is patulous. Lungs/Pleura: Moderate to large right and small left pleural effusions with multi segment atelectasis. No edema or consolidation. Musculoskeletal: No acute or aggressive finding Review of the MIP images confirms the above findings. CT ABDOMEN and PELVIS FINDINGS Hepatobiliary: No focal liver abnormality.No evidence of biliary obstruction or stone. Pancreas: Atrophic appearance of the pancreas tail without evidence  of mass or ductal dilatation. Spleen: Unremarkable. Adrenals/Urinary Tract: Negative adrenals. Mass within the collecting system of of the upper and interpolar right kidney with speckled calcifications. The mass is somewhat low-density, but still primarily concerning for urothelial carcinoma. Given low-density debris/chronic hemorrhage is also considered. No related obstruction. No high-density clot. Mild hydronephrosis on the left. The urinary bladder is markedly distended. Stomach/Bowel: No obstruction. No inflammatory changes. Hiatal hernia described above. Vascular/Lymphatic: No acute vascular abnormality. No mass or adenopathy. Reproductive:Negative Other: No ascites or pneumoperitoneum.  Musculoskeletal: Remote L3 compression fracture. Lower lumbar degenerative disc narrowing. Review of the MIP images confirms the above findings. IMPRESSION: 1. Excellent clearing of previously seen pulmonary emboli. No interval embolus identified. 2. Large right and small left pleural effusions are stable from 02/26/2016. Multi segment atelectasis. 3. Intrarenal and renal pelvis collecting system mass on the right. Recommend workup for urothelial carcinoma. 4. Lower esophageal thickening and mucosal hyper enhancement, likely reflux esophagitis in the setting of hiatal hernia. Follow-up is recommended. 5. Over distended urinary bladder. Electronically Signed   By: Monte Fantasia M.D.   On: 03/09/2016 15:04   Ct Abdomen Pelvis W Contrast  Result Date: 03/09/2016 CLINICAL DATA:  Left-sided chest pain. Pain to palpation along the left abdomen. EXAM: CT ANGIOGRAPHY CHEST CT ABDOMEN AND PELVIS WITH CONTRAST TECHNIQUE: Multidetector CT imaging of the chest was performed using the standard protocol during bolus administration of intravenous contrast. Multiplanar CT image reconstructions and MIPs were obtained to evaluate the vascular anatomy. Multidetector CT imaging of the abdomen and pelvis was performed using the standard protocol during bolus administration of intravenous contrast. CONTRAST:  75 cc Isovue 370 intravenous COMPARISON:  02/26/2016 FINDINGS: CTA CHEST FINDINGS Cardiovascular: Borderline cardiomegaly. Question small volume pericardial fluid posteriorly. Satisfactory opacification of the pulmonary arteries. Significant decrease in pulmonary artery clot that was seen previously. No web or new embolus identified. Normal right to left ventricle ratio. Atherosclerosis, including the coronary arteries. No acute aortic finding. Mediastinum/Nodes: Sliding hiatal hernia with prominent thickening an mucosal hyperenhancement of the lower esophagus. Superiorly the esophagus is patulous. Lungs/Pleura: Moderate to  large right and small left pleural effusions with multi segment atelectasis. No edema or consolidation. Musculoskeletal: No acute or aggressive finding Review of the MIP images confirms the above findings. CT ABDOMEN and PELVIS FINDINGS Hepatobiliary: No focal liver abnormality.No evidence of biliary obstruction or stone. Pancreas: Atrophic appearance of the pancreas tail without evidence of mass or ductal dilatation. Spleen: Unremarkable. Adrenals/Urinary Tract: Negative adrenals. Mass within the collecting system of of the upper and interpolar right kidney with speckled calcifications. The mass is somewhat low-density, but still primarily concerning for urothelial carcinoma. Given low-density debris/chronic hemorrhage is also considered. No related obstruction. No high-density clot. Mild hydronephrosis on the left. The urinary bladder is markedly distended. Stomach/Bowel: No obstruction. No inflammatory changes. Hiatal hernia described above. Vascular/Lymphatic: No acute vascular abnormality. No mass or adenopathy. Reproductive:Negative Other: No ascites or pneumoperitoneum. Musculoskeletal: Remote L3 compression fracture. Lower lumbar degenerative disc narrowing. Review of the MIP images confirms the above findings. IMPRESSION: 1. Excellent clearing of previously seen pulmonary emboli. No interval embolus identified. 2. Large right and small left pleural effusions are stable from 02/26/2016. Multi segment atelectasis. 3. Intrarenal and renal pelvis collecting system mass on the right. Recommend workup for urothelial carcinoma. 4. Lower esophageal thickening and mucosal hyper enhancement, likely reflux esophagitis in the setting of hiatal hernia. Follow-up is recommended. 5. Over distended urinary bladder. Electronically Signed   By: Neva Seat.D.  On: 03/09/2016 15:04   US Venous Img Lower Bilateral  Result Date: 02/27/2016 CLINICAL DATA:  Pulmonary thromboembolism yesterday EXAM: BILATERAL LOWER  EXTREMITY VENOUS DUPLEX ULTRASOUND TECHNIQUE: Doppler venous assessment of the left lower extremity deep venous system was performed, including characterization of spectral flow, compressibility, and phasicity. COMPARISON:  None. FINDINGS: There is partially occlusive thrombus in the right common femoral vein. The femoral and popliteal veins are compressible and are without thrombus. Doppler analysis demonstrates respiratory phasicity in the deep venous system of the right lower extremity. In the left lower extremity, there is complete compressibility of the left common femoral, femoral, and popliteal veins. There is partially occlusive thrombus within the posterior tibial vein of the left calf. There is also superficial vein thrombus in the left lesser saphenous vein. IMPRESSION: The study is positive for DVT. There is partially occlusive thrombus in the right common femoral vein and left posterior tibial veins. The femoral and popliteal veins are patent bilaterally. Electronically Signed   By: Marybelle Killings M.D.   On: 02/27/2016 14:40   Dg Chest Portable 1 View  Result Date: 03/09/2016 CLINICAL DATA:  Shortness of breath and chest pain EXAM: PORTABLE CHEST 1 VIEW COMPARISON:  March 07, 2016 FINDINGS: There is partially loculated pleural effusion on the right. There is patchy atelectasis in the right mid lung and right base regions. There is no demonstrable airspace consolidation currently in this area. On the left, there is slight left base atelectasis. Left lung otherwise clear. Heart is mildly enlarged with pulmonary vascularity within normal limits. There is atherosclerotic calcification in the aorta. There is no evident adenopathy. There is atherosclerotic calcification in both carotid arteries. There is evidence of old trauma involving the acromioclavicular joint with separation in this area, chronic. Bones are osteoporotic. Postoperative changes noted in the lower cervical spine. IMPRESSION: Partially  loculated right pleural effusion with patchy atelectasis in the right mid and lower lung zones. No frank consolidation currently in the right base. Mild left base atelectasis. Stable cardiomegaly with pulmonary vascularity within normal limits. Bones osteoporotic. Chronic acromioclavicular separation on the right. Extensive carotid artery calcification bilaterally noted. There is also aortic atherosclerosis. Electronically Signed   By: Lowella Grip III M.D.   On: 03/09/2016 14:18   US Thoracentesis Asp Pleural Space W/img Guide  Result Date: 03/10/2016 INDICATION: Chest pain and right pleural effusion on recent imaging. EXAM: ULTRASOUND GUIDED RIGHT THORACENTESIS MEDICATIONS: None. COMPLICATIONS: None immediate. PROCEDURE: An ultrasound guided thoracentesis was thoroughly discussed with the patient and questions answered. The benefits, risks, alternatives and complications were also discussed. The patient understands and wishes to proceed with the procedure. Written consent was obtained. Ultrasound was performed to localize and mark an adequate pocket of fluid in the right chest. The area was then prepped and draped in the normal sterile fashion. 1% Lidocaine was used for local anesthesia. Under ultrasound guidance a 6 Fr Safe-T-Centesis catheter was introduced. Thoracentesis was performed. The catheter was removed and a dressing applied. FINDINGS: A total of approximately 410 mL of yellow fluid was removed. Samples were sent to the laboratory as requested by the clinical team. IMPRESSION: Successful ultrasound guided right thoracentesis yielding 410 mL of pleural fluid. Electronically Signed   By: Markus Daft M.D.   On: 03/10/2016 11:30    Assessment:   Sherry [name] is a 83 y.o. female was admitted for intractable N/V on 3/19 and found to have UTI on UA in hospital. Patient is currently receiving IV meropenem, and is without  N/V at this time. No wbc or fevers. Had a foley in place for urinary retention  as well as a renal mass. Foley dced by urology for voiding trial. Plan on repeat imaging to follow renal mass She had multiple issues prior with pleural effusions and PE.  Effusions resolved on recent CXR.    Recommendations Continue meropenem for now  Await results of culture and will adjust as needed Thank you very much for allowing me to participate in the care of this patient. Please call with questions.   Cheral Marker. Ola Spurr, MD

## 2016-03-28 NOTE — Consult Note (Signed)
Consult: Urinary retention, right collecting system mass first hemorrhage Requested by: Dr. Anselm Jungling, V  History of Present Illness: 83 year old consult for right collecting system mass and urinary retention.   1) right collecting system mass - Patient was admitted 03/09/2016 with chest pain likely due to a pleural effusion and history of PE. She is on Eliquis. CT scan of the abdomen and pelvis was obtained which showed some thickening of the right upper pole collecting system and a possible low-density lesion with speckled calcification in the right renal pelvis - neoplasm versus inflammation versus clot.There was no hydronephrosis. She denies any urologic history. She's had no gross hematuria. I reviewed the images.   2) urinary retention-on admission 03/09/2016 a distended bladder was noted on the CT scan and a Foley catheter was placed. She was started on tamsulosin. Urine culture grew greater than 100,000 Enterobacter and she was treated with antibiotics.  Today, Sherry [name] is seen for the above. She was readmitted last night with nausea and vomiting. Her labs actually looked very good. White count 4.6, BUN 16, creatinine 0.96. UA showed too numerous to count red cells, too numerous to count white cells, no bacteria (patient has a Foley). She was discussed with urology last visit and was to follow-up outpatient but has not done so. She feels well today.    Past Medical History:  Diagnosis Date  . Diabetes mellitus without complication ([place])   . Hyperlipidemia   . Hypertension    Past Surgical History:  Procedure Laterality Date  . BACK SURGERY    . SHOULDER SURGERY      Home Medications:  Prescriptions Prior to Admission  Medication Sig Dispense Refill Last Dose  . acetaminophen (TYLENOL) 325 MG tablet Take 650 mg by mouth every 4 (four) hours as needed for mild pain or moderate pain.   prn at prn  . amLODipine (NORVASC) 5 MG tablet Take 5 mg by mouth daily.   03/27/2016 at 0947  .  apixaban (ELIQUIS) 5 MG TABS tablet Take 1 tablet (5 mg total) by mouth 2 (two) times daily. 60 tablet 3 03/27/2016 at 0947  . atorvastatin (LIPITOR) 20 MG tablet Take 1 tablet by mouth at bedtime.   03/26/2016 at 2006  . gabapentin (NEURONTIN) 300 MG capsule Take 1 capsule by mouth at bedtime.   03/26/2016 at 2006  . guaiFENesin (MUCINEX) 600 MG 12 hr tablet Take by mouth 2 (two) times daily.     . Insulin Detemir (LEVEMIR) 100 UNIT/ML Pen Inject 15 Units into the skin every morning.    03/27/2016 at 0947  . lisinopril-hydrochlorothiazide (PRINZIDE,ZESTORETIC) 20-12.5 MG tablet Take 2 tablets by mouth daily.    03/27/2016 at 0947  . ondansetron (ZOFRAN) 4 MG tablet Take 4 mg by mouth 2 (two) times daily. *May take every 8 hours as needed for nausea and vomiting   03/27/2016 at 0537  . pantoprazole (PROTONIX) 40 MG tablet Take 1 tablet (40 mg total) by mouth daily. 30 tablet 0 03/27/2016 at 0947  . tamsulosin (FLOMAX) 0.4 MG CAPS capsule Take 1 capsule (0.4 mg total) by mouth daily. 30 capsule 0 03/27/2016 at 0947  . meloxicam (MOBIC) 7.5 MG tablet Take 7.5 mg by mouth daily.   Not Taking at Unknown time   Allergies: No Known Allergies  History reviewed. No pertinent family history. Social History:  reports that she has never smoked. She has never used smokeless tobacco. She reports that she does not drink alcohol or use drugs.  ROS: A  complete review of systems was performed.  All systems are negative except for pertinent findings as noted. ROS   Physical Exam:  Vital signs in last 24 hours: Temp:  [97.4 F (36.3 C)-98.3 F (36.8 C)] 98.3 F (36.8 C) (03/20 0849) Pulse Rate:  [41-101] 86 (03/20 0849) Resp:  [13-24] 16 (03/20 0849) BP: (113-154)/(31-80) 118/65 (03/20 0849) SpO2:  [96 %-100 %] 98 % (03/20 0849) Weight:  [68.4 kg (150 lb 14.4 oz)-73.9 kg (163 lb)] 68.4 kg (150 lb 14.4 oz) (03/19 2300) General:  Alert and oriented, No acute distress, elderly, on O2 HEENT: Normocephalic,  atraumatic Cardiovascular: Regular rate and rhythm Lungs: Regular rate and effort Abdomen: Soft, nontender, nondistended, no abdominal masses Back: No CVA tenderness Extremities: No edema Neurologic: Grossly intact GU: Urine clear   Laboratory Data:  Results for orders placed or performed during the hospital encounter of 03/27/16 (from the past 24 hour(s))  Basic metabolic panel     Status: Abnormal   Collection Time: 03/27/16  1:40 PM  Result Value Ref Range   Sodium 133 (L) 135 - 145 mmol/L   Potassium 3.3 (L) 3.5 - 5.1 mmol/L   Chloride 94 (L) 101 - 111 mmol/L   CO2 29 22 - 32 mmol/L   Glucose, Bld 134 (H) 65 - 99 mg/dL   BUN 16 6 - 20 mg/dL   Creatinine, Ser 0.96 0.44 - 1.00 mg/dL   Calcium 8.1 (L) 8.9 - 10.3 mg/dL   GFR calc non Af Amer 52 (L) >60 mL/min   GFR calc Af Amer >60 >60 mL/min   Anion gap 10 5 - 15  CBC     Status: Abnormal   Collection Time: 03/27/16  1:40 PM  Result Value Ref Range   WBC 4.6 3.6 - 11.0 K/uL   RBC 3.84 3.80 - 5.20 MIL/uL   Hemoglobin 10.7 (L) 12.0 - 16.0 g/dL   HCT 31.8 (L) 35.0 - 47.0 %   MCV 82.8 80.0 - 100.0 fL   MCH 27.9 26.0 - 34.0 pg   MCHC 33.7 32.0 - 36.0 g/dL   RDW 15.9 (H) 11.5 - 14.5 %   Platelets 231 150 - 440 K/uL  Hepatic function panel     Status: Abnormal   Collection Time: 03/27/16  1:40 PM  Result Value Ref Range   Total Protein 5.4 (L) 6.5 - 8.1 g/dL   Albumin 2.5 (L) 3.5 - 5.0 g/dL   AST 13 (L) 15 - 41 U/L   ALT 8 (L) 14 - 54 U/L   Alkaline Phosphatase 57 38 - 126 U/L   Total Bilirubin 0.9 0.3 - 1.2 mg/dL   Bilirubin, Direct 0.1 0.1 - 0.5 mg/dL   Indirect Bilirubin 0.8 0.3 - 0.9 mg/dL  Lipase, blood     Status: Abnormal   Collection Time: 03/27/16  1:40 PM  Result Value Ref Range   Lipase <10 (L) 11 - 51 U/L  Troponin I     Status: None   Collection Time: 03/27/16  1:40 PM  Result Value Ref Range   Troponin I <0.03 <0.03 ng/mL  Urinalysis, Complete w Microscopic     Status: Abnormal   Collection Time:  03/27/16  6:20 PM  Result Value Ref Range   Color, Urine YELLOW (A) YELLOW   APPearance HAZY (A) CLEAR   Specific Gravity, Urine 1.015 1.005 - 1.030   pH 5.0 5.0 - 8.0   Glucose, UA NEGATIVE NEGATIVE mg/dL   Hgb urine dipstick MODERATE (A) NEGATIVE  Bilirubin Urine NEGATIVE NEGATIVE   Ketones, ur 20 (A) NEGATIVE mg/dL   Protein, ur NEGATIVE NEGATIVE mg/dL   Nitrite NEGATIVE NEGATIVE   Leukocytes, UA MODERATE (A) NEGATIVE   RBC / HPF TOO NUMEROUS TO COUNT 0 - 5 RBC/hpf   WBC, UA TOO NUMEROUS TO COUNT 0 - 5 WBC/hpf   Bacteria, UA NONE SEEN NONE SEEN   Squamous Epithelial / LPF 0-5 (A) NONE SEEN   Mucous PRESENT    Budding Yeast PRESENT    Hyaline Casts, UA PRESENT    Non Squamous Epithelial 0-5 (A) NONE SEEN  Glucose, capillary     Status: Abnormal   Collection Time: 03/27/16 10:47 PM  Result Value Ref Range   Glucose-Capillary 179 (H) 65 - 99 mg/dL  MRSA PCR Screening     Status: None   Collection Time: 03/27/16 11:06 PM  Result Value Ref Range   MRSA by PCR NEGATIVE NEGATIVE  Glucose, capillary     Status: None   Collection Time: 03/28/16  7:18 AM  Result Value Ref Range   Glucose-Capillary 83 65 - 99 mg/dL   Recent Results (from the past 240 hour(s))  MRSA PCR Screening     Status: None   Collection Time: 03/27/16 11:06 PM  Result Value Ref Range Status   MRSA by PCR NEGATIVE NEGATIVE Final    Comment:        The GeneXpert MRSA Assay (FDA approved for NASAL specimens only), is one component of a comprehensive MRSA colonization surveillance program. It is not intended to diagnose MRSA infection nor to guide or monitor treatment for MRSA infections.    Creatinine:  Recent Labs  03/27/16 1340  CREATININE 0.96    Impression/Assessment/plan:  1) Urinary retention-I discussed with the patient voiding trial and she elected to proceed. Will discontinue Foley. This is a good time for a voiding trial while she is on antibiotics and relatively stable. Follow urine  cx.   2) right collecting system mass-differential diagnosis includes hemorrhage, inflammation or neoplasm. She is not a good surgical candidate and therefore I would consider reimaging at about 6 weeks or the middle of April to ensure these findings are still present, better or worse. I sent a message for follow-up to have this arranged in the outpatient. We could also send a urine cytology then. Discussed importance of follow-up with the patient.  Will sign off please call with any questions.   Sherry [name] 03/28/2016, 12:10 PM

## 2016-03-28 NOTE — Telephone Encounter (Signed)
done

## 2016-03-28 NOTE — NC FL2 (Signed)
[place] LEVEL OF CARE SCREENING TOOL     IDENTIFICATION  Patient Name: Sherry [name] Birthdate: February 14, 1927 Sex: female Admission Date (Current Location): 03/27/2016  [place] and [place] Number:  Engineering geologist and Address:  [facility], [address], [place], [place] [address]      Provider Number: [id number]  Attending Physician Name and Address:  Vaughan Basta, MD  Relative Name and Phone Number:       Current Level of Care: Hospital Recommended Level of Care: [place] Prior Approval Number:    Date Approved/Denied:   PASRR Number:  [id number])  Discharge Plan: SNF    Current Diagnoses: Patient Active Problem List   Diagnosis Date Noted  . Nausea & vomiting 03/27/2016  . Advance care planning   . UTI (urinary tract infection) 03/12/2016  . Chest pain 03/09/2016  . PE (pulmonary thromboembolism) ([place]) 02/26/2016  . Diabetes mellitus, type 2 ([place]) 02/26/2016  . Anemia 02/26/2016  . Chronic kidney disease (CKD), stage III (moderate) 02/26/2016  . Essential hypertension 02/26/2016  . Goals of care, counseling/discussion   . Adult failure to thrive syndrome   . DNR (do not resuscitate)   . Palliative care by specialist   . Sepsis ([place]) 11/26/2014    Orientation RESPIRATION BLADDER Height & Weight     Self, Time, Situation, Place  O2 (Nasal Cannula 2L/min ) External catheter Weight: 150 lb 14.4 oz (68.4 kg) Height:  5\' 4"  (162.6 cm)  BEHAVIORAL SYMPTOMS/MOOD NEUROLOGICAL BOWEL NUTRITION STATUS   (None. )  (None. ) Incontinent Soft Diet  AMBULATORY STATUS COMMUNICATION OF NEEDS Skin   Limited Assist Verbally Normal                       Personal Care Assistance Level of Assistance  Bathing, Feeding, Dressing Bathing Assistance: Independent Feeding assistance: Limited assistance Dressing Assistance: Independent     Functional Limitations Info  Sight, Hearing, Speech Sight Info:  Adequate Hearing Info: Adequate Speech Info: Adequate    SPECIAL CARE FACTORS FREQUENCY  PT (By licensed PT), OT (By licensed OT)     PT Frequency:  (5) OT Frequency:  (5)            Contractures      Additional Factors Info  Code Status, Allergies, Insulin Sliding Scale Code Status Info:  (Full Code) Allergies Info:  (No Known Allergies)   Insulin Sliding Scale Info:  (NovoLog)       Current Medications (03/28/2016):  This is the current hospital active medication list Current Facility-Administered Medications  Medication Dose Route Frequency Provider Last Rate Last Dose  . 0.9 %  sodium chloride infusion   Intravenous Continuous Fritzi Mandes, MD 100 mL/hr at 03/28/16 0908    . acetaminophen (TYLENOL) tablet 650 mg  650 mg Oral Q6H PRN Fritzi Mandes, MD       Or  . acetaminophen (TYLENOL) suppository 650 mg  650 mg Rectal Q6H PRN Fritzi Mandes, MD      . amLODipine (NORVASC) tablet 5 mg  5 mg Oral Daily Fritzi Mandes, MD   5 mg at 03/28/16 0901  . apixaban (ELIQUIS) tablet 5 mg  5 mg Oral BID Fritzi Mandes, MD   5 mg at 03/28/16 0901  . atorvastatin (LIPITOR) tablet 20 mg  20 mg Oral QHS Fritzi Mandes, MD   20 mg at 03/28/16 0006  . bisacodyl (DULCOLAX) EC tablet 5 mg  5 mg Oral Daily  PRN Fritzi Mandes, MD      . docusate sodium (COLACE) capsule 100 mg  100 mg Oral BID Fritzi Mandes, MD   100 mg at 03/28/16 0901  . gabapentin (NEURONTIN) capsule 300 mg  300 mg Oral QHS Fritzi Mandes, MD   300 mg at 03/28/16 0006  . guaiFENesin (MUCINEX) 12 hr tablet 600 mg  600 mg Oral BID Fritzi Mandes, MD   600 mg at 03/28/16 0901  . insulin aspart (novoLOG) injection 0-5 Units  0-5 Units Subcutaneous QHS Vaughan Basta, MD      . insulin aspart (novoLOG) injection 0-9 Units  0-9 Units Subcutaneous TID WC Vaughan Basta, MD      . meropenem (MERREM) IVPB SOLR 1 g  1 g Intravenous Q12H Fritzi Mandes, MD   1 g at 03/28/16 0848  . ondansetron (ZOFRAN) tablet 4 mg  4 mg Oral Q6H PRN Fritzi Mandes, MD       Or   . ondansetron (ZOFRAN) injection 4 mg  4 mg Intravenous Q6H PRN Fritzi Mandes, MD      . ondansetron (ZOFRAN) tablet 4 mg  4 mg Oral BID Fritzi Mandes, MD   4 mg at 03/28/16 0901  . pantoprazole (PROTONIX) EC tablet 40 mg  40 mg Oral Daily Fritzi Mandes, MD   40 mg at 03/28/16 0901  . senna-docusate (Senokot-S) tablet 1 tablet  1 tablet Oral QHS PRN Fritzi Mandes, MD      . tamsulosin (FLOMAX) capsule 0.4 mg  0.4 mg Oral Daily Fritzi Mandes, MD   0.4 mg at 03/28/16 0901  . traMADol (ULTRAM) tablet 50 mg  50 mg Oral Q6H PRN Fritzi Mandes, MD         Discharge Medications: Please see discharge summary for a list of discharge medications.  Relevant Imaging Results:  Relevant Lab Results:   Additional Information  (SSN: [id number])  Danie Chandler, Student-Social Work

## 2016-03-28 NOTE — Telephone Encounter (Signed)
-----   Message from Festus Aloe, MD sent at 03/28/2016 12:23 PM EDT ----- Pt need f/u with any MD in about 2 weeks

## 2016-03-28 NOTE — Progress Notes (Signed)
[place] [facility] [place] NAME: Sherry [name]    MR#:  [id number]  DATE OF BIRTH:  Nov 11, 1927  SUBJECTIVE:  CHIEF COMPLAINT:   Chief Complaint  Patient presents with  . Pneumonia     Recent admission for pneumonia, pleural effusion, UTI. Sent home with indwelling catheter and sent back from nursing home with UTI. Also found to have small mass in the kidney in last admission and was advised to follow with urologist, but within 2 weeks before she can have appointment she is back with UTI.  REVIEW OF SYSTEMS:  CONSTITUTIONAL: No fever, fatigue or weakness.  EYES: No blurred or double vision.  EARS, NOSE, AND THROAT: No tinnitus or ear pain.  RESPIRATORY: No cough, shortness of breath, wheezing or hemoptysis.  CARDIOVASCULAR: No chest pain, orthopnea, edema.  GASTROINTESTINAL: No nausea, vomiting, diarrhea or abdominal pain.  GENITOURINARY: No dysuria, hematuria.  ENDOCRINE: No polyuria, nocturia,  HEMATOLOGY: No anemia, easy bruising or bleeding SKIN: No rash or lesion. MUSCULOSKELETAL: No joint pain or arthritis.   NEUROLOGIC: No tingling, numbness, weakness.  PSYCHIATRY: No anxiety or depression.   ROS  DRUG ALLERGIES:  No Known Allergies  VITALS:  Blood pressure 118/65, pulse 86, temperature 98.3 F (36.8 C), temperature source Oral, resp. rate 16, height 5\' 4"  (1.626 m), weight 68.4 kg (150 lb 14.4 oz), SpO2 98 %.  PHYSICAL EXAMINATION:  GENERAL:  83 y.o.-year-old patient lying in the bed with no acute distress.  EYES: Pupils equal, round, reactive to light and accommodation. No scleral icterus. Extraocular muscles intact.  HEENT: Head atraumatic, normocephalic. Oropharynx and nasopharynx clear.  NECK:  Supple, no jugular venous distention. No thyroid enlargement, no tenderness.  LUNGS: Normal breath sounds bilaterally, no wheezing, rales,rhonchi or crepitation. No use of accessory muscles of respiration.  CARDIOVASCULAR: S1, S2 normal.  No murmurs, rubs, or gallops.  ABDOMEN: Soft, nontender, nondistended. Bowel sounds present. No organomegaly or mass. Foley catheter in place. EXTREMITIES: No pedal edema, cyanosis, or clubbing.  NEUROLOGIC: Cranial nerves II through XII are intact. Muscle strength 5/5 in all extremities. Sensation intact. Gait not checked.  PSYCHIATRIC: The patient is alert and oriented x 3.  SKIN: No obvious rash, lesion, or ulcer.   Physical Exam LABORATORY PANEL:   CBC  Recent Labs Lab 03/27/16 1340  WBC 4.6  HGB 10.7*  HCT 31.8*  PLT 231   ------------------------------------------------------------------------------------------------------------------  Chemistries   Recent Labs Lab 03/27/16 1340  NA 133*  K 3.3*  CL 94*  CO2 29  GLUCOSE 134*  BUN 16  CREATININE 0.96  CALCIUM 8.1*  AST 13*  ALT 8*  ALKPHOS 57  BILITOT 0.9   ------------------------------------------------------------------------------------------------------------------  Cardiac Enzymes  Recent Labs Lab 03/27/16 1340  TROPONINI <0.03   ------------------------------------------------------------------------------------------------------------------  RADIOLOGY:  Dg Chest 2 View  Result Date: 03/27/2016 CLINICAL DATA:  Pneumonia. Cough. Chest pain and shortness of breath. EXAM: CHEST  2 VIEW COMPARISON:  Chest x-rays dated 03/10/2016, 03/09/2016 and 03/07/2016 and chest CT dated 03/09/2016 FINDINGS: The heart is at the upper limits of normal in size. Pulmonary vascularity is normal. There has been marked decrease in the right pleural effusion with a small to moderate residual right effusion. There is a tiny residual left effusion. No consolidative infiltrates. No acute bone abnormality. Calcification in the arch of the aorta. IMPRESSION: Marked improvement in the right pleural effusion. Almost complete resolution of left pleural effusion. No new abnormalities. Electronically Signed   By: Lorriane Shire M.D.  On: 03/27/2016 14:52    ASSESSMENT AND PLAN:   Active Problems:   Nausea & vomiting  Sherry [name]  is a 83 y.o. female with a known history of Diabetes, hyperlipidemia, hypertension and history of pulmonary embolism who is on oral anticoagulation comes in the emergency room with nausea vomiting poor by mouth intake for 3-4 days at [facility].  1. Intractable nausea vomiting secondary to recurrent UTI in the setting of indwelling Foley catheter that was placed on 03/13/2016 when patient was discharged to [facility] - on IV meropenem -ID consult -Follow blood cultures  2. Right renal mass possible with urinary retention and indwelling Foley catheter (since 03/13/16) -Patient was supposed to see Dr.  Hollice Espy as outpatient [facility] urology consult  3. Diabetes continue home meds and sliding scale  4. History of PE on oral anticoagulation will continue that  5. DVT prophylaxis on oral anticoagulation    All the records are reviewed and case discussed with Care Management/Social Workerr. Management plans discussed with the patient, family and they are in agreement.  CODE STATUS: full.  TOTAL TIME TAKING CARE OF THIS PATIENT: 35 minutes.     POSSIBLE D/C IN 1-2 DAYS, DEPENDING ON CLINICAL CONDITION.   Vaughan Basta M.D on 03/28/2016   Between 7am to 6pm - Pager - [phone]  After 6pm go to [email or web address] - password [email or web address] [facility]  Office  [phone]  CC: Primary care physician; [facility]  Note: This dictation was prepared with Dragon dictation along with smaller phrase technology. Any transcriptional errors that result from this process are unintentional.

## 2016-03-29 LAB — URINE CULTURE: CULTURE: NO GROWTH

## 2016-03-29 LAB — GLUCOSE, CAPILLARY
GLUCOSE-CAPILLARY: 57 mg/dL — AB (ref 65–99)
Glucose-Capillary: 97 mg/dL (ref 65–99)
Glucose-Capillary: 167 mg/dL — ABNORMAL HIGH (ref 65–99)
Glucose-Capillary: 91 mg/dL (ref 65–99)

## 2016-03-29 NOTE — Discharge Summary (Signed)
[facility] [place] NAME: Sherry [name]    MR#:  [id number]  DATE OF BIRTH:  09/04/1927  DATE OF ADMISSION:  03/27/2016 ADMITTING PHYSICIAN: Fritzi Mandes, MD  DATE OF DISCHARGE: 03/29/16  PRIMARY CARE PHYSICIAN: Aubrey    ADMISSION DIAGNOSIS:  Urinary tract infection without hematuria, site unspecified [N39.0] Non-intractable vomiting without nausea, unspecified vomiting type [R11.11]  DISCHARGE DIAGNOSIS:  Acute Gastroenteritis-suspected viral Urinary retention-d/ced foley---needs urology to see as out pt  SECONDARY DIAGNOSIS:   Past Medical History:  Diagnosis Date  . Diabetes mellitus without complication ([place])   . Hyperlipidemia   . Hypertension     HOSPITAL COURSE:  Sherry [name]  is a 83 y.o. female with a known history of Diabetes, hyperlipidemia, hypertension and history of pulmonary embolism who is on oral anticoagulation comes in the emergency room with nausea vomiting poor by mouth intake for 3-4 days at [facility].  1. Intractable nausea vomiting acute GE vs due to secondary to recurrent UTI in the setting of indwelling Foley catheter that was placed on 03/13/2016 when patient was discharged to [facility] start her on IV meropenem---UC negative d/c abxs -ID consult apprecaited -Follow blood cultures negative  2. Right renal mass possible with urinary retention and indwelling Foley catheter (since 03/13/16) -Patient was supposed to see Dr.  Hollice Espy as outpatient -seen by  urology dr eskrdige. D/ced foley. Pt urinated. Prn in and out cath if needed -on flomax  3. Diabetes continue home meds and sliding scale  4. History of PE on oral anticoagulation will continue that  5. DVT prophylaxis on oral anticoagulation  pt will d/c to rehab D/w dter [name] [facility]  CONSULTS OBTAINED:  Treatment Team:  Festus Aloe, MD  DRUG ALLERGIES:  No Known Allergies  DISCHARGE  MEDICATIONS:   Current Discharge Medication List    CONTINUE these medications which have NOT CHANGED   Details  acetaminophen (TYLENOL) 325 MG tablet Take 650 mg by mouth every 4 (four) hours as needed for mild pain or moderate pain.    amLODipine (NORVASC) 5 MG tablet Take 5 mg by mouth daily.    apixaban (ELIQUIS) 5 MG TABS tablet Take 1 tablet (5 mg total) by mouth 2 (two) times daily. Qty: 60 tablet, Refills: 3    atorvastatin (LIPITOR) 20 MG tablet Take 1 tablet by mouth at bedtime.    gabapentin (NEURONTIN) 300 MG capsule Take 1 capsule by mouth at bedtime.    guaiFENesin (MUCINEX) 600 MG 12 hr tablet Take by mouth 2 (two) times daily.    Insulin Detemir (LEVEMIR) 100 UNIT/ML Pen Inject 15 Units into the skin every morning.     lisinopril-hydrochlorothiazide (PRINZIDE,ZESTORETIC) 20-12.5 MG tablet Take 2 tablets by mouth daily.     ondansetron (ZOFRAN) 4 MG tablet Take 4 mg by mouth 2 (two) times daily. *May take every 8 hours as needed for nausea and vomiting    pantoprazole (PROTONIX) 40 MG tablet Take 1 tablet (40 mg total) by mouth daily. Qty: 30 tablet, Refills: 0    tamsulosin (FLOMAX) 0.4 MG CAPS capsule Take 1 capsule (0.4 mg total) by mouth daily. Qty: 30 capsule, Refills: 0      STOP taking these medications     meloxicam (MOBIC) 7.5 MG tablet         If you experience worsening of your admission symptoms, develop shortness of breath, life threatening emergency, suicidal or homicidal thoughts  you must seek medical attention immediately by calling 911 or calling your MD immediately  if symptoms less severe.  You Must read complete instructions/literature along with all the possible adverse reactions/side effects for all the Medicines you take and that have been prescribed to you. Take any new Medicines after you have completely understood and accept all the possible adverse reactions/side effects.   Please note  You were cared for by a hospitalist during  your hospital stay. If you have any questions about your discharge medications or the care you received while you were in the hospital after you are discharged, you can call the unit and asked to speak with the hospitalist on call if the hospitalist that took care of you is not available. Once you are discharged, your primary care physician will handle any further medical issues. Please note that NO REFILLS for any discharge medications will be authorized once you are discharged, as it is imperative that you return to your primary care physician (or establish a relationship with a primary care physician if you do not have one) for your aftercare needs so that they can reassess your need for medications and monitor your lab values. Today   SUBJECTIVE   Doing well  VITAL SIGNS:  Blood pressure 129/67, pulse 80, temperature 97.5 F (36.4 C), temperature source Oral, resp. rate 16, height 5\' 4"  (1.626 m), weight 68.4 kg (150 lb 14.4 oz), SpO2 100 %.  I/O:   Intake/Output Summary (Last 24 hours) at 03/29/16 1151 Last data filed at 03/29/16 0800  Gross per 24 hour  Intake          2501.67 ml  Output             1310 ml  Net          1191.67 ml    PHYSICAL EXAMINATION:  GENERAL:  83 y.o.-year-old patient lying in the bed with no acute distress.  EYES: Pupils equal, round, reactive to light and accommodation. No scleral icterus. Extraocular muscles intact.  HEENT: Head atraumatic, normocephalic. Oropharynx and nasopharynx clear.  NECK:  Supple, no jugular venous distention. No thyroid enlargement, no tenderness.  LUNGS: Normal breath sounds bilaterally, no wheezing, rales,rhonchi or crepitation. No use of accessory muscles of respiration.  CARDIOVASCULAR: S1, S2 normal. No murmurs, rubs, or gallops.  ABDOMEN: Soft, non-tender, non-distended. Bowel sounds present. No organomegaly or mass.  EXTREMITIES: No pedal edema, cyanosis, or clubbing.  NEUROLOGIC: Cranial nerves II through XII are intact.  Muscle strength 5/5 in all extremities. Sensation intact. Gait not checked.  PSYCHIATRIC: The patient is alert and oriented x 3.  SKIN: No obvious rash, lesion, or ulcer.   DATA REVIEW:   CBC   Recent Labs Lab 03/27/16 1340  WBC 4.6  HGB 10.7*  HCT 31.8*  PLT 231    Chemistries   Recent Labs Lab 03/27/16 1340  NA 133*  K 3.3*  CL 94*  CO2 29  GLUCOSE 134*  BUN 16  CREATININE 0.96  CALCIUM 8.1*  AST 13*  ALT 8*  ALKPHOS 14  BILITOT 0.9    Microbiology Results   Recent Results (from the past 240 hour(s))  Urine culture     Status: None   Collection Time: 03/27/16  6:20 PM  Result Value Ref Range Status   Specimen Description URINE, RANDOM  Final   Special Requests NONE  Final   Culture   Final    NO GROWTH Performed at [facility], [place]  [address] [place], [place] [address]    Report Status 03/29/2016 FINAL  Final  MRSA PCR Screening     Status: None   Collection Time: 03/27/16 11:06 PM  Result Value Ref Range Status   MRSA by PCR NEGATIVE NEGATIVE Final    Comment:        The GeneXpert MRSA Assay (FDA approved for NASAL specimens only), is one component of a comprehensive MRSA colonization surveillance program. It is not intended to diagnose MRSA infection nor to guide or monitor treatment for MRSA infections.     RADIOLOGY:  Dg Chest 2 View  Result Date: 03/27/2016 CLINICAL DATA:  Pneumonia. Cough. Chest pain and shortness of breath. EXAM: CHEST  2 VIEW COMPARISON:  Chest x-rays dated 03/10/2016, 03/09/2016 and 03/07/2016 and chest CT dated 03/09/2016 FINDINGS: The heart is at the upper limits of normal in size. Pulmonary vascularity is normal. There has been marked decrease in the right pleural effusion with a small to moderate residual right effusion. There is a tiny residual left effusion. No consolidative infiltrates. No acute bone abnormality. Calcification in the arch of the aorta. IMPRESSION: Marked improvement in the right pleural  effusion. Almost complete resolution of left pleural effusion. No new abnormalities. Electronically Signed   By: Lorriane Shire M.D.   On: 03/27/2016 14:52     Management plans discussed with the patient, family and they are in agreement.  CODE STATUS:     Code Status Orders        Start     Ordered   03/27/16 2140  Full code  Continuous     03/27/16 2139    Code Status History    Date Active Date Inactive Code Status Order ID Comments User Context   03/12/2016 12:21 PM 03/13/2016  8:32 PM Full Code [id number]  Dustin Flock, MD Inpatient   03/09/2016  4:33 PM 03/12/2016 12:21 PM DNR [id number]  Nicholes Mango, MD ED   02/26/2016  9:39 PM 03/01/2016 10:37 PM DNR [id number]  Karmen Bongo, MD Inpatient   02/26/2016  7:48 PM 02/26/2016  9:39 PM DNR [id number]  Dorie Rank, MD ED   01/31/2016  5:45 PM 02/07/2016 11:45 PM DNR [id number]  Flora Lipps, MD Inpatient   01/30/2016 11:19 PM 01/31/2016  5:44 PM Full Code [id number]  Harvie Bridge, DO Inpatient   11/26/2014  3:52 PM 11/28/2014  3:07 PM Full Code [id number]  Max Sane, MD Inpatient   11/26/2014  8:59 AM 11/26/2014  3:52 PM Full Code [id number]  Max Sane, MD ED    Advance Directive Documentation     Most Recent Value  Type of Advance Directive  Living will  Pre-existing out of facility DNR order (yellow form or pink MOST form)  -  "MOST" Form in Place?  -      TOTAL TIME TAKING CARE OF THIS PATIENT: 40 minutes.    Purvis Sidle M.D on 03/29/2016 at 11:51 AM  Between 7am to 6pm - Pager - [phone] After 6pm go to [email or web address] - password [email or web address] [facility]  Office  [phone]  CC: Primary care physician; [facility]

## 2016-03-29 NOTE — Clinical Social Work Note (Signed)
Pt is ready for discharge today and will return to [facility]. Pt and family are aware and agreeable to discharge plan. Facility is ready to admit pt as they received discharge information. RN will call report. [facility] will provide transportation. CSW is signing off as no further needs identified.   Darden Dates, MSW, LCSW  Clinical Social Worker  [phone]

## 2016-03-29 NOTE — Progress Notes (Signed)
Spoke to Dr. Estanislado Pandy regarding patient's blood pressure of 132/38 map (61) via phone. MD was made aware and no further order is made at this time. Will continue to monitor patient.

## 2016-03-29 NOTE — Discharge Instructions (Signed)
IN AND OUT CATHETER BID PRN FOR URINARY RETENTION  MAKE SURE PATIENT GOES TO HER UROLOGY APPOINTMENT AS SCHEDULED

## 2016-03-29 NOTE — Progress Notes (Addendum)
Bladder Scan showed 391 ml

## 2016-03-29 NOTE — Progress Notes (Signed)
Report called to [place] LPN and [facility], called EMS to pick up

## 2016-04-06 ENCOUNTER — Ambulatory Visit (INDEPENDENT_AMBULATORY_CARE_PROVIDER_SITE_OTHER): Payer: Medicare Other | Admitting: Urology

## 2016-04-06 ENCOUNTER — Encounter: Payer: Self-pay | Admitting: Urology

## 2016-04-06 VITALS — BP 95/55 | HR 83 | Ht 64.0 in

## 2016-04-06 DIAGNOSIS — R339 Retention of urine, unspecified: Secondary | ICD-10-CM

## 2016-04-06 DIAGNOSIS — D49519 Neoplasm of unspecified behavior of unspecified kidney: Secondary | ICD-10-CM | POA: Diagnosis not present

## 2016-04-06 NOTE — Progress Notes (Signed)
Simple Catheter Placement  Due to urinary retention patient is present today for a foley cath placement.  Patient was cleaned and prepped in a sterile fashion with betadine and lidocaine jelly 2% was instilled into the urethra.  A 16 FR foley catheter was inserted, urine return was noted  1663ml, urine was orange in color.  The balloon was filled with 10cc of sterile water.  A night bag was attached for drainage. Patient was also given a leg bag to take home and was given instruction on how to change from one bag to another.  Patient was given instruction on proper catheter care.  Patient tolerated well, no complications were noted   Preformed by: Elberta Leatherwood, CMA  Additional notes/ Follow up: 3 weeks

## 2016-04-06 NOTE — Progress Notes (Signed)
04/06/2016 11:55 AM   Sherry [name] [name] 1927-05-18 [id number]  Referring provider: [facility] [place], [place] [address]  Chief Complaint  Patient presents with  . New Patient (Initial Visit)    Kidney mass    HPI: 83 year old follow up after being seen as an inpatient for right collecting system mass and urinary retention.   1) right collecting system mass - Patient was admitted 03/09/2016 with chest pain likely due to a pleural effusion and history of PE. He is on Eliquis. CT scan of the abdomen and pelvis was obtained which showed some thickening of the right upper pole collecting system and a possible low-density lesion with speckled calcification in the right renal pelvis - neoplasm versus inflammation versus clot.There was no hydronephrosis. She denies any urologic history. she's had no gross hematuria. I reviewed the images personally today.   2) hx of urinary retention-on admission to hospital on 03/09/2016 a distended bladder was noted on the CT scan and a Foley catheter was placed. She was started on tamsulosin. Urine culture grew greater than 100,000 Enterobacter and she was treated with antibiotics. Foley d/c-ed in hospital. PVR today 1,000 cc. Has no urge to void currently. Denies frequent UTIs    PMH: Past Medical History:  Diagnosis Date  . Diabetes mellitus without complication ([place])   . Hyperlipidemia   . Hypertension     Surgical History: Past Surgical History:  Procedure Laterality Date  . APPENDECTOMY    . BACK SURGERY    . SHOULDER SURGERY      Home Medications:  Allergies as of 04/06/2016   No Known Allergies     Medication List       Accurate as of 04/06/16 11:55 AM. Always use your most recent med list.          acetaminophen 325 MG tablet Commonly known as:  TYLENOL Take 650 mg by mouth every 4 (four) hours as needed for mild pain or moderate pain.   amLODipine 5 MG tablet Commonly known as:   NORVASC Take 5 mg by mouth daily.   apixaban 5 MG Tabs tablet Commonly known as:  ELIQUIS Take 1 tablet (5 mg total) by mouth 2 (two) times daily.   atorvastatin 20 MG tablet Commonly known as:  LIPITOR Take 1 tablet by mouth at bedtime.   gabapentin 300 MG capsule Commonly known as:  NEURONTIN Take 1 capsule by mouth at bedtime.   guaiFENesin 600 MG 12 hr tablet Commonly known as:  MUCINEX Take by mouth 2 (two) times daily.   Insulin Detemir 100 UNIT/ML Pen Commonly known as:  LEVEMIR Inject 15 Units into the skin every morning.   lisinopril-hydrochlorothiazide 20-12.5 MG tablet Commonly known as:  PRINZIDE,ZESTORETIC Take 2 tablets by mouth daily.   ondansetron 4 MG tablet Commonly known as:  ZOFRAN Take 4 mg by mouth 2 (two) times daily. *May take every 8 hours as needed for nausea and vomiting   pantoprazole 40 MG tablet Commonly known as:  PROTONIX Take 1 tablet (40 mg total) by mouth daily.   tamsulosin 0.4 MG Caps capsule Commonly known as:  FLOMAX Take 1 capsule (0.4 mg total) by mouth daily.       Allergies: No Known Allergies  Family History: Family History  Problem Relation Age of Onset  . Bladder Cancer Neg Hx   . Kidney cancer Neg Hx     Social History:  reports that she has never smoked. She has never  used smokeless tobacco. She reports that she does not drink alcohol or use drugs.  ROS: UROLOGY Frequent Urination?: Yes Hard to postpone urination?: No Burning/pain with urination?: No Get up at night to urinate?: No Leakage of urine?: Yes Urine stream starts and stops?: No Trouble starting stream?: No Do you have to strain to urinate?: No Blood in urine?: No Urinary tract infection?: Yes Sexually transmitted disease?: No Injury to kidneys or bladder?: No Painful intercourse?: No Weak stream?: No Currently pregnant?: No Vaginal bleeding?: No Last menstrual period?: n  Gastrointestinal Nausea?: Yes Vomiting?:  Yes Indigestion/heartburn?: No Diarrhea?: Yes Constipation?: No  Constitutional Fever: No Night sweats?: No Weight loss?: No Fatigue?: Yes  Skin Skin rash/lesions?: No Itching?: No  Eyes Blurred vision?: No Double vision?: No  Ears/Nose/Throat Sore throat?: No Sinus problems?: No  Hematologic/Lymphatic Swollen glands?: No Easy bruising?: Yes  Cardiovascular Leg swelling?: Yes Chest pain?: No  Respiratory Cough?: Yes Shortness of breath?: Yes  Endocrine Excessive thirst?: No  Musculoskeletal Back pain?: No Joint pain?: No  Neurological Headaches?: No Dizziness?: No  Psychologic Depression?: No Anxiety?: No  Physical Exam: BP (!) 95/55 (BP Location: Left Arm, Patient Position: Sitting, Cuff Size: Normal)   Pulse 83   Ht 5\' 4"  (1.626 m)   Constitutional:  Alert and oriented, No acute distress. HEENT: [place] AT, moist mucus membranes.  Trachea midline, no masses. Cardiovascular: No clubbing, cyanosis, or edema. Respiratory: Normal respiratory effort, no increased work of breathing. GI: Abdomen is soft, nontender, nondistended, no abdominal masses GU: No CVA tenderness.  Skin: No rashes, bruises or suspicious lesions. Lymph: No cervical or inguinal adenopathy. Neurologic: Grossly intact, no focal deficits, moving all 4 extremities. Psychiatric: Normal mood and affect.  Laboratory Data: Lab Results  Component Value Date   WBC 4.6 03/27/2016   HGB 10.7 (L) 03/27/2016   HCT 31.8 (L) 03/27/2016   MCV 82.8 03/27/2016   PLT 231 03/27/2016    Lab Results  Component Value Date   CREATININE 0.96 03/27/2016    No results found for: PSA  No results found for: TESTOSTERONE  Lab Results  Component Value Date   HGBA1C 5.8 11/26/2014    Urinalysis    Component Value Date/Time   COLORURINE YELLOW (A) 03/27/2016 1820   APPEARANCEUR HAZY (A) 03/27/2016 1820   APPEARANCEUR Clear 10/21/2012 1333   LABSPEC 1.015 03/27/2016 1820   LABSPEC 1.006  10/21/2012 1333   PHURINE 5.0 03/27/2016 1820   GLUCOSEU NEGATIVE 03/27/2016 1820   GLUCOSEU 50 mg/dL 10/21/2012 1333   HGBUR MODERATE (A) 03/27/2016 1820   BILIRUBINUR NEGATIVE 03/27/2016 1820   BILIRUBINUR Negative 10/21/2012 1333   KETONESUR 20 (A) 03/27/2016 1820   PROTEINUR NEGATIVE 03/27/2016 1820   NITRITE NEGATIVE 03/27/2016 1820   LEUKOCYTESUR MODERATE (A) 03/27/2016 1820   LEUKOCYTESUR Negative 10/21/2012 1333    Pertinent Imaging: CLINICAL DATA:  Left-sided chest pain. Pain to palpation along the left abdomen.  EXAM: CT ANGIOGRAPHY CHEST  CT ABDOMEN AND PELVIS WITH CONTRAST  TECHNIQUE: Multidetector CT imaging of the chest was performed using the standard protocol during bolus administration of intravenous contrast. Multiplanar CT image reconstructions and MIPs were obtained to evaluate the vascular anatomy. Multidetector CT imaging of the abdomen and pelvis was performed using the standard protocol during bolus administration of intravenous contrast.  CONTRAST:  75 cc Isovue 370 intravenous  COMPARISON:  02/26/2016  FINDINGS: CTA CHEST FINDINGS  Cardiovascular: Borderline cardiomegaly. Question small volume pericardial fluid posteriorly. Satisfactory opacification of the pulmonary arteries. Significant  decrease in pulmonary artery clot that was seen previously. No web or new embolus identified. Normal right to left ventricle ratio. Atherosclerosis, including the coronary arteries. No acute aortic finding.  Mediastinum/Nodes: Sliding hiatal hernia with prominent thickening an mucosal hyperenhancement of the lower esophagus. Superiorly the esophagus is patulous.  Lungs/Pleura: Moderate to large right and small left pleural effusions with multi segment atelectasis. No edema or consolidation.  Musculoskeletal: No acute or aggressive finding  Review of the MIP images confirms the above findings.  CT ABDOMEN and PELVIS  FINDINGS  Hepatobiliary: No focal liver abnormality.No evidence of biliary obstruction or stone.  Pancreas: Atrophic appearance of the pancreas tail without evidence of mass or ductal dilatation.  Spleen: Unremarkable.  Adrenals/Urinary Tract: Negative adrenals. Mass within the collecting system of of the upper and interpolar right kidney with speckled calcifications. The mass is somewhat low-density, but still primarily concerning for urothelial carcinoma. Given low-density debris/chronic hemorrhage is also considered. No related obstruction. No high-density clot. Mild hydronephrosis on the left. The urinary bladder is markedly distended.  Stomach/Bowel: No obstruction. No inflammatory changes. Hiatal hernia described above.  Vascular/Lymphatic: No acute vascular abnormality. No mass or adenopathy.  Reproductive:Negative  Other: No ascites or pneumoperitoneum.  Musculoskeletal: Remote L3 compression fracture. Lower lumbar degenerative disc narrowing.  Review of the MIP images confirms the above findings.  IMPRESSION: 1. Excellent clearing of previously seen pulmonary emboli. No interval embolus identified. 2. Large right and small left pleural effusions are stable from 02/26/2016. Multi segment atelectasis. 3. Intrarenal and renal pelvis collecting system mass on the right. Recommend workup for urothelial carcinoma. 4. Lower esophageal thickening and mucosal hyper enhancement, likely reflux esophagitis in the setting of hiatal hernia. Follow-up is recommended. 5. Over distended urinary bladder.  Assessment & Plan:    1) urinary retention - continue flomax. Replace foley. Trial of void at next visit, though patient likely to fail this as well given large volume retention with no urge to void.  2) right collecting system mass-differential diagnosis includes hemorrhage, inflammation or neoplasm. She is not a good surgical candidate and will repeat CT urogram  in 2 weeks (6 weeks from previous imaging) to revaluate this finding. If still present patient likely will be followed conservatively due to her recent PE requiring home oxygen, need for eliquis, and overall health status.  Return in about 3 weeks (around 04/27/2016) for CT Urogram a few days prior to appt. TOV during appt.  Nickie Retort, MD  [facility] [address] [place], [place] [address] [phone]

## 2016-04-18 ENCOUNTER — Inpatient Hospital Stay
Admission: EM | Admit: 2016-04-18 | Discharge: 2016-04-21 | DRG: 698 | Disposition: A | Discharge status: Skilled Nursing Facility | Payer: Medicare Other | Attending: Internal Medicine | Admitting: Internal Medicine

## 2016-04-18 ENCOUNTER — Emergency Department: Payer: Medicare Other

## 2016-04-18 DIAGNOSIS — Z86711 Personal history of pulmonary embolism: Secondary | ICD-10-CM | POA: Diagnosis not present

## 2016-04-18 DIAGNOSIS — N2889 Other specified disorders of kidney and ureter: Secondary | ICD-10-CM | POA: Diagnosis present

## 2016-04-18 DIAGNOSIS — R4701 Aphasia: Secondary | ICD-10-CM | POA: Diagnosis present

## 2016-04-18 DIAGNOSIS — T83518A Infection and inflammatory reaction due to other urinary catheter, initial encounter: Secondary | ICD-10-CM | POA: Diagnosis present

## 2016-04-18 DIAGNOSIS — G9349 Other encephalopathy: Secondary | ICD-10-CM | POA: Diagnosis present

## 2016-04-18 DIAGNOSIS — Z7901 Long term (current) use of anticoagulants: Secondary | ICD-10-CM | POA: Diagnosis not present

## 2016-04-18 DIAGNOSIS — Z79899 Other long term (current) drug therapy: Secondary | ICD-10-CM

## 2016-04-18 DIAGNOSIS — E11649 Type 2 diabetes mellitus with hypoglycemia without coma: Secondary | ICD-10-CM | POA: Diagnosis present

## 2016-04-18 DIAGNOSIS — N183 Chronic kidney disease, stage 3 unspecified: Secondary | ICD-10-CM | POA: Diagnosis present

## 2016-04-18 DIAGNOSIS — Y846 Urinary catheterization as the cause of abnormal reaction of the patient, or of later complication, without mention of misadventure at the time of the procedure: Secondary | ICD-10-CM | POA: Diagnosis present

## 2016-04-18 DIAGNOSIS — I129 Hypertensive chronic kidney disease with stage 1 through stage 4 chronic kidney disease, or unspecified chronic kidney disease: Secondary | ICD-10-CM | POA: Diagnosis present

## 2016-04-18 DIAGNOSIS — E1122 Type 2 diabetes mellitus with diabetic chronic kidney disease: Secondary | ICD-10-CM | POA: Diagnosis present

## 2016-04-18 DIAGNOSIS — K5900 Constipation, unspecified: Secondary | ICD-10-CM | POA: Diagnosis present

## 2016-04-18 DIAGNOSIS — E785 Hyperlipidemia, unspecified: Secondary | ICD-10-CM | POA: Diagnosis present

## 2016-04-18 DIAGNOSIS — R4182 Altered mental status, unspecified: Secondary | ICD-10-CM

## 2016-04-18 DIAGNOSIS — B964 Proteus (mirabilis) (morganii) as the cause of diseases classified elsewhere: Secondary | ICD-10-CM | POA: Diagnosis present

## 2016-04-18 DIAGNOSIS — N289 Disorder of kidney and ureter, unspecified: Secondary | ICD-10-CM | POA: Diagnosis present

## 2016-04-18 DIAGNOSIS — N390 Urinary tract infection, site not specified: Secondary | ICD-10-CM | POA: Diagnosis present

## 2016-04-18 DIAGNOSIS — K209 Esophagitis, unspecified: Secondary | ICD-10-CM | POA: Diagnosis present

## 2016-04-18 DIAGNOSIS — Z66 Do not resuscitate: Secondary | ICD-10-CM | POA: Diagnosis present

## 2016-04-18 DIAGNOSIS — Z794 Long term (current) use of insulin: Secondary | ICD-10-CM

## 2016-04-18 DIAGNOSIS — I10 Essential (primary) hypertension: Secondary | ICD-10-CM | POA: Diagnosis present

## 2016-04-18 DIAGNOSIS — E119 Type 2 diabetes mellitus without complications: Secondary | ICD-10-CM

## 2016-04-18 DIAGNOSIS — G459 Transient cerebral ischemic attack, unspecified: Secondary | ICD-10-CM | POA: Diagnosis not present

## 2016-04-18 LAB — DIFFERENTIAL
Basophils Absolute: 0.1 10*3/uL (ref 0–0.1)
Basophils Relative: 1 %
EOS ABS: 0.1 10*3/uL (ref 0–0.7)
EOS PCT: 1 %
Lymphocytes Relative: 38 %
Lymphs Abs: 2.6 10*3/uL (ref 1.0–3.6)
Monocytes Absolute: 0.4 10*3/uL (ref 0.2–0.9)
Monocytes Relative: 6 %
NEUTROS PCT: 54 %
Neutro Abs: 3.6 10*3/uL (ref 1.4–6.5)

## 2016-04-18 LAB — URINALYSIS, COMPLETE (UACMP) WITH MICROSCOPIC
Glucose, UA: NEGATIVE mg/dL
Hgb urine dipstick: NEGATIVE
Ketones, ur: NEGATIVE mg/dL
Nitrite: POSITIVE — AB
Protein, ur: 100 mg/dL — AB
SPECIFIC GRAVITY, URINE: 1.016 (ref 1.005–1.030)
pH: 8 (ref 5.0–8.0)

## 2016-04-18 LAB — COMPREHENSIVE METABOLIC PANEL
ALBUMIN: 2.9 g/dL — AB (ref 3.5–5.0)
ALT: 10 U/L — ABNORMAL LOW (ref 14–54)
ANION GAP: 7 (ref 5–15)
AST: 24 U/L (ref 15–41)
Alkaline Phosphatase: 59 U/L (ref 38–126)
BILIRUBIN TOTAL: 0.6 mg/dL (ref 0.3–1.2)
BUN: 14 mg/dL (ref 6–20)
CO2: 27 mmol/L (ref 22–32)
Calcium: 8.6 mg/dL — ABNORMAL LOW (ref 8.9–10.3)
Chloride: 102 mmol/L (ref 101–111)
Creatinine, Ser: 1.17 mg/dL — ABNORMAL HIGH (ref 0.44–1.00)
GFR calc Af Amer: 47 mL/min — ABNORMAL LOW (ref >60)
GFR calc non Af Amer: 41 mL/min — ABNORMAL LOW (ref >60)
GLUCOSE: 88 mg/dL (ref 65–99)
POTASSIUM: 3.8 mmol/L (ref 3.5–5.1)
Sodium: 136 mmol/L (ref 135–145)
TOTAL PROTEIN: 5.8 g/dL — AB (ref 6.5–8.1)

## 2016-04-18 LAB — GLUCOSE, CAPILLARY
GLUCOSE-CAPILLARY: 114 mg/dL — AB (ref 65–99)
Glucose-Capillary: 129 mg/dL — ABNORMAL HIGH (ref 65–99)
Glucose-Capillary: 83 mg/dL (ref 65–99)

## 2016-04-18 LAB — CBC
HCT: 33.1 % — ABNORMAL LOW (ref 35.0–47.0)
Hemoglobin: 10.7 g/dL — ABNORMAL LOW (ref 12.0–16.0)
MCH: 27.5 pg (ref 26.0–34.0)
MCHC: 32.4 g/dL (ref 32.0–36.0)
MCV: 84.9 fL (ref 80.0–100.0)
PLATELETS: 252 10*3/uL (ref 150–440)
RBC: 3.9 MIL/uL (ref 3.80–5.20)
RDW: 16.7 % — AB (ref 11.5–14.5)
WBC: 6.9 10*3/uL (ref 3.6–11.0)

## 2016-04-18 LAB — PROTIME-INR
INR: 1.15
PROTHROMBIN TIME: 14.8 s (ref 11.4–15.2)

## 2016-04-18 LAB — TROPONIN I

## 2016-04-18 LAB — APTT: aPTT: 32 seconds (ref 24–36)

## 2016-04-18 MED ORDER — CEFTRIAXONE SODIUM-DEXTROSE 1-3.74 GM-% IV SOLR
1.0000 g | Freq: Once | INTRAVENOUS | Status: AC
  Administered 2016-04-18: 1 g via INTRAVENOUS
  Filled 2016-04-18: qty 50

## 2016-04-18 MED ORDER — ONDANSETRON HCL 4 MG/2ML IJ SOLN
4.0000 mg | Freq: Once | INTRAMUSCULAR | Status: AC
  Administered 2016-04-18: 4 mg via INTRAVENOUS
  Filled 2016-04-18: qty 2

## 2016-04-18 MED ORDER — DEXTROSE 5 % IV SOLN: 1.0000 g | Freq: Once | INTRAVENOUS | Status: DC

## 2016-04-18 NOTE — ED Notes (Signed)
Pt tolerated soap suds enema well, pt currently sitting on BR toilet in NAD at this time.

## 2016-04-18 NOTE — ED Notes (Signed)
Pt reports abd pain and nausea since BM, pt reports she can not remember when her last BM was. EDP notified.

## 2016-04-18 NOTE — ED Notes (Addendum)
Pts door open, in view of nursing station.  Pt able to answer some question sts"I feel good"

## 2016-04-18 NOTE — ED Provider Notes (Signed)
[facility] Emergency Department Provider Note  ____________________________________________  Time seen: Approximately 8:50 PM  I have reviewed the triage vital signs and the nursing notes.   HISTORY  Chief Complaint Code Stroke  Level 5 caveat:  Portions of the history and physical were unable to be obtained due to the patient's acute illness   HPI Sherry [name] is a 83 y.o. female brought to the ED due to sudden onset of unresponsiveness at her nursing home. To the ED for evaluation, patient unable to provide any history.     Past Medical History:  Diagnosis Date  . Diabetes mellitus without complication ([place])   . Hyperlipidemia   . Hypertension      Patient Active Problem List   Diagnosis Date Noted  . Nausea & vomiting 03/27/2016  . Advance care planning   . UTI (urinary tract infection) 03/12/2016  . Chest pain 03/09/2016  . PE (pulmonary thromboembolism) ([place]) 02/26/2016  . Diabetes mellitus, type 2 ([place]) 02/26/2016  . Anemia 02/26/2016  . Chronic kidney disease (CKD), stage III (moderate) 02/26/2016  . Essential hypertension 02/26/2016  . Goals of care, counseling/discussion   . Adult failure to thrive syndrome   . DNR (do not resuscitate)   . Palliative care by specialist   . Sepsis ([place]) 11/26/2014     Past Surgical History:  Procedure Laterality Date  . APPENDECTOMY    . BACK SURGERY    . SHOULDER SURGERY       Prior to Admission medications   Medication Sig Start Date End Date Taking? Authorizing Provider  acetaminophen (TYLENOL) 325 MG tablet Take 650 mg by mouth every 4 (four) hours as needed for mild pain or moderate pain.   Yes Historical Provider, MD  amLODipine (NORVASC) 5 MG tablet Take 5 mg by mouth daily.   Yes Historical Provider, MD  apixaban (ELIQUIS) 5 MG TABS tablet Take 1 tablet (5 mg total) by mouth 2 (two) times daily. 03/08/16  Yes Erline Hau, MD  atorvastatin (LIPITOR) 20 MG tablet Take 1  tablet by mouth at bedtime.   Yes Historical Provider, MD  gabapentin (NEURONTIN) 300 MG capsule Take 1 capsule by mouth at bedtime.   Yes Historical Provider, MD  guaiFENesin (MUCINEX) 600 MG 12 hr tablet Take by mouth 2 (two) times daily.   Yes Historical Provider, MD  Insulin Detemir (LEVEMIR) 100 UNIT/ML Pen Inject 15 Units into the skin every morning.    Yes Historical Provider, MD  insulin lispro (HUMALOG) 100 UNIT/ML injection Inject into the skin 3 (three) times daily before meals. Sliding scale insulin. 0-99=0 units 100-149=2 units 150-199=3 units 200-249=4 units 250-299=5 units 300-349=6 units 350-399=7 units 400-449=8 units 450-499=9 units   Yes Historical Provider, MD  lisinopril-hydrochlorothiazide (PRINZIDE,ZESTORETIC) 20-12.5 MG tablet Take 2 tablets by mouth daily.    Yes Historical Provider, MD  ondansetron (ZOFRAN) 4 MG tablet Take 4 mg by mouth 2 (two) times daily. *May take every 8 hours as needed for nausea and vomiting   Yes Historical Provider, MD  pantoprazole (PROTONIX) 40 MG tablet Take 1 tablet (40 mg total) by mouth daily. 03/13/16  Yes Fritzi Mandes, MD  tamsulosin (FLOMAX) 0.4 MG CAPS capsule Take 1 capsule (0.4 mg total) by mouth daily. 03/14/16  Yes Fritzi Mandes, MD     Allergies Patient has no known allergies.   Family History  Problem Relation Age of Onset  . Bladder Cancer Neg Hx   . Kidney cancer Neg Hx  Social History Social History  Substance Use Topics  . Smoking status: Never Smoker  . Smokeless tobacco: Never Used  . Alcohol use No    Review of Systems Unable to obtain due to altered mental status  ____________________________________________   PHYSICAL EXAM:  VITAL SIGNS: ED Triage Vitals  Enc Vitals Group     BP 04/18/16 1800 (!) 134/52     Pulse Rate 04/18/16 1800 73     Resp 04/18/16 1800 14     Temp 04/18/16 1800 97.4 F (36.3 C)     Temp Source 04/18/16 1800 Oral     SpO2 04/18/16 1800 97 %     Weight 04/18/16 1801 150  lb (68 kg)     Height 04/18/16 1801 5\' 4"  (1.626 m)     Head Circumference --      Peak Flow --      Pain Score 04/18/16 1800 0     Pain Loc --      Pain Edu? --      Excl. in [place]? --     Vital signs reviewed, nursing assessments reviewed.   Constitutional:   Awake, does not appear aware of her surroundings. Not in distress. Eyes:   No scleral icterus. No conjunctival pallor. PERRL. EOMI.  No nystagmus. ENT   Head:   Normocephalic and atraumatic.   Nose:   No congestion/rhinnorhea. No septal hematoma   Mouth/Throat:   MMM, no pharyngeal erythema. No peritonsillar mass.    Neck:   No stridor. No SubQ emphysema. No meningismus. Hematological/Lymphatic/Immunilogical:   No cervical lymphadenopathy. Cardiovascular:   RRR. Symmetric bilateral radial and DP pulses.  No murmurs.  Respiratory:   Normal respiratory effort without tachypnea nor retractions. Breath sounds are clear and equal bilaterally. No wheezes/rales/rhonchi. Gastrointestinal:   Soft and nontender. Non distended. There is no CVA tenderness.  No rebound, rigidity, or guarding. Genitourinary:   deferred Musculoskeletal:   Normal range of motion in all extremities. No joint effusions.  No lower extremity tenderness.  No edema. Neurologic:   Aphasia without any attempt to speak at all  CN 2-10 normal. Motor grossly intact. Stroke scale 1, difficult to assess  Skin:    Skin is warm, dry and intact. No rash noted.  No petechiae, purpura, or bullae.  ____________________________________________    LABS (pertinent positives/negatives) (all labs ordered are listed, but only abnormal results are displayed) Labs Reviewed  CBC - Abnormal; Notable for the following:       Result Value   Hemoglobin 10.7 (*)    HCT 33.1 (*)    RDW 16.7 (*)    All other components within normal limits  COMPREHENSIVE METABOLIC PANEL - Abnormal; Notable for the following:    Creatinine, Ser 1.17 (*)    Calcium 8.6 (*)    Total  Protein 5.8 (*)    Albumin 2.9 (*)    ALT 10 (*)    GFR calc non Af Amer 41 (*)    GFR calc Af Amer 47 (*)    All other components within normal limits  PROTIME-INR  APTT  DIFFERENTIAL  TROPONIN I  GLUCOSE, CAPILLARY  URINALYSIS, ROUTINE W REFLEX MICROSCOPIC  URINALYSIS, COMPLETE (UACMP) WITH MICROSCOPIC   ____________________________________________   EKG  Interpreted by me Sinus rhythm rate of 74, first-degree AV block with PR interval 240 ms. Normal axis. Left bundle branch block. No acute ischemic changes.  ____________________________________________    [id number]  Dg Chest Portable 1 View  Result Date: 04/18/2016  CLINICAL DATA:  Altered level of consciousness. Code stroke. Weakness. EXAM: PORTABLE CHEST 1 VIEW COMPARISON:  03/27/2016 and previous FINDINGS: Chronic cardiomegaly. Chronic aortic atherosclerosis. Less pleural fluid and less atelectasis in the lower lungs. Upper lungs are clear. No worsening or new findings. IMPRESSION: Radiographic improvement with resolving effusions and much less basilar atelectasis. Electronically Signed   By: Nelson Chimes M.D.   On: 04/18/2016 18:36   Ct Head Code Stroke W/o Cm  Result Date: 04/18/2016 CLINICAL DATA:  Code stroke. Aphasia, last seen normal at 1532 hours. History of hypertension, hyperlipidemia and diabetes. EXAM: CT HEAD WITHOUT CONTRAST TECHNIQUE: Contiguous axial images were obtained from the base of the skull through the vertex without intravenous contrast. COMPARISON:  None. FINDINGS: BRAIN: No intraparenchymal hemorrhage, mass effect nor midline shift. The ventricles and sulci are normal for age. No acute large vascular territory infarct. Old moderate LEFT parietal lobe infarct. Patchy to confluent supratentorial white matter hypodensities. Old basal ganglia lacunar infarcts. Age indeterminate thalamus lacunar infarcts. Patchy supratentorial white matter hypodensities. VASCULAR: Moderate calcific atherosclerosis of the  carotid siphons. SKULL: No skull fracture. Severe LEFT temporomandibular osteoarthrosis. No significant scalp soft tissue swelling. SINUSES/ORBITS: The mastoid air-cells and included paranasal sinuses are well-aerated.The included ocular globes and orbital contents are non-suspicious. OTHER: Patient is edentulous. ASPECTS [facility] Stroke Program Early CT Score) - Ganglionic level infarction (caudate, lentiform nuclei, internal capsule, insula, M1-M3 cortex): 7 - Supraganglionic infarction (M4-M6 cortex): 3 Total score (0-10 with 10 being normal): 10 IMPRESSION: 1. No acute intracranial process. 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 Old basal ganglia and likely chronic bilateral thalamus infarcts. Moderate to severe chronic small vessel ischemic disease. 2. ASPECTS is 10. Critical Value/emergent results were called by telephone at the time of interpretation on 04/18/2016 at 6:24 pm to Dr. Carrie Mew , who verbally acknowledged these results. Electronically Signed   By: Elon Alas M.D.   On: 04/18/2016 18:25    ____________________________________________   PROCEDURES Procedures  ____________________________________________   INITIAL IMPRESSION / ASSESSMENT AND PLAN / ED COURSE  Pertinent labs & imaging results that were available during my care of the patient were reviewed by me and considered in my medical decision making (see chart for details).  Patient presents with altered mental status. Initiate code stroke after initial assessment.     Clinical Course as of Apr 18 2048  Tue Apr 18, 2016  1825 D/w Neuro.  Exam nonfocal, on eliquis, not a tpa candidate. Recommend hospitalization for further stroke vs seizure workup.   [PS]    Clinical Course User Index [PS] Carrie Mew, MD     ----------------------------------------- 8:54 PM on 04/18/2016 -----------------------------------------  Case discussed with hospitalist for admission. Does have improved alertness.  Is speaking somewhat very limited vocabulary, but also vomiting. We'll give Zofran.  ____________________________________________   FINAL CLINICAL IMPRESSION(S) / ED DIAGNOSES  Final diagnoses:  Aphasia  Altered mental status, unspecified altered mental status type      New Prescriptions   No medications on file     Portions of this note were generated with dragon dictation software. Dictation errors may occur despite best attempts at proofreading.    Carrie Mew, MD 04/18/16 [phone]

## 2016-04-18 NOTE — H&P (Signed)
[place] [facility] [place] NAME: Sherry [name]    MR#:  [id number]  DATE OF BIRTH:  Dec 01, 1927  DATE OF ADMISSION:  04/18/2016  PRIMARY CARE PHYSICIAN: [place]   REQUESTING/REFERRING PHYSICIAN: Joni Fears, MD  CHIEF COMPLAINT:   Chief Complaint  Patient presents with  . Code Stroke    HISTORY OF PRESENT ILLNESS:  Sherry [name]  is a 83 y.o. female who presents with Unresponsiveness, initially felt in the ED to have a aphasia. Code stroke process initiated and hospitals were called for admission. On workup he was found to have a significant UTI on UA. Her neurological symptoms improved greatly while in the ED.  PAST MEDICAL HISTORY:   Past Medical History:  Diagnosis Date  . Diabetes mellitus without complication ([place])   . Hyperlipidemia   . Hypertension     PAST SURGICAL HISTORY:   Past Surgical History:  Procedure Laterality Date  . APPENDECTOMY    . BACK SURGERY    . SHOULDER SURGERY      SOCIAL HISTORY:   Social History  Substance Use Topics  . Smoking status: Never Smoker  . Smokeless tobacco: Never Used  . Alcohol use No    FAMILY HISTORY:   Family History  Problem Relation Age of Onset  . Diabetes Mother   . Bladder Cancer Neg Hx   . Kidney cancer Neg Hx     DRUG ALLERGIES:  No Known Allergies  MEDICATIONS AT HOME:   Prior to Admission medications   Medication Sig Start Date End Date Taking? Authorizing Provider  acetaminophen (TYLENOL) 325 MG tablet Take 650 mg by mouth every 4 (four) hours as needed for mild pain or moderate pain.   Yes Historical Provider, MD  amLODipine (NORVASC) 5 MG tablet Take 5 mg by mouth daily.   Yes Historical Provider, MD  apixaban (ELIQUIS) 5 MG TABS tablet Take 1 tablet (5 mg total) by mouth 2 (two) times daily. 03/08/16  Yes Erline Hau, MD  atorvastatin (LIPITOR) 20 MG tablet Take 1 tablet by mouth at bedtime.   Yes Historical Provider, MD   gabapentin (NEURONTIN) 300 MG capsule Take 1 capsule by mouth at bedtime.   Yes Historical Provider, MD  guaiFENesin (MUCINEX) 600 MG 12 hr tablet Take by mouth 2 (two) times daily.   Yes Historical Provider, MD  Insulin Detemir (LEVEMIR) 100 UNIT/ML Pen Inject 15 Units into the skin every morning.    Yes Historical Provider, MD  insulin lispro (HUMALOG) 100 UNIT/ML injection Inject into the skin 3 (three) times daily before meals. Sliding scale insulin. 0-99=0 units 100-149=2 units 150-199=3 units 200-249=4 units 250-299=5 units 300-349=6 units 350-399=7 units 400-449=8 units 450-499=9 units   Yes Historical Provider, MD  lisinopril-hydrochlorothiazide (PRINZIDE,ZESTORETIC) 20-12.5 MG tablet Take 2 tablets by mouth daily.    Yes Historical Provider, MD  ondansetron (ZOFRAN) 4 MG tablet Take 4 mg by mouth 2 (two) times daily. *May take every 8 hours as needed for nausea and vomiting   Yes Historical Provider, MD  pantoprazole (PROTONIX) 40 MG tablet Take 1 tablet (40 mg total) by mouth daily. 03/13/16  Yes Fritzi Mandes, MD  tamsulosin (FLOMAX) 0.4 MG CAPS capsule Take 1 capsule (0.4 mg total) by mouth daily. 03/14/16  Yes Fritzi Mandes, MD    REVIEW OF SYSTEMS:  Review of Systems  Unable to perform ROS: Acuity of condition     VITAL SIGNS:   Vitals:   04/18/16  2157 04/18/16 2200 04/18/16 2224 04/18/16 2230  BP: (!) 125/56 (!) 144/40  (!) 131/47  Pulse: (!) 104  89 92  Resp: 13 17 14 17   Temp:      TempSrc:      SpO2: 96% 94% 97% 98%  Weight:      Height:       Wt Readings from Last 3 Encounters:  04/18/16 68 kg (150 lb)  03/27/16 68.4 kg (150 lb 14.4 oz)  03/09/16 74.7 kg (164 lb 11.2 oz)    PHYSICAL EXAMINATION:  Physical Exam  Vitals reviewed. Constitutional: She is oriented to person, place, and time. She appears well-developed and well-nourished. No distress.  HENT:  Head: Normocephalic and atraumatic.  Mouth/Throat: Oropharynx is clear and moist.  Eyes: Conjunctivae and  EOM are normal. Pupils are equal, round, and reactive to light. No scleral icterus.  Neck: Normal range of motion. Neck supple. No JVD present. No thyromegaly present.  Cardiovascular: Normal rate, regular rhythm and intact distal pulses.  Exam reveals no gallop and no friction rub.   No murmur heard. Respiratory: Effort normal and breath sounds normal. No respiratory distress. She has no wheezes. She has no rales.  GI: Soft. Bowel sounds are normal. She exhibits no distension. There is no tenderness.  Musculoskeletal: Normal range of motion. She exhibits no edema.  No arthritis, no gout  Lymphadenopathy:    She has no cervical adenopathy.  Neurological: She is alert and oriented to person, place, and time. No cranial nerve deficit.  Neurologic: Cranial nerves II-XII intact, Sensation intact to light touch/pinprick, 4/5 strength in all extremities, no dysarthria, no aphasia.   Skin: Skin is warm and dry. No rash noted. No erythema.  Psychiatric: She has a normal mood and affect. Her behavior is normal. Judgment and thought content normal.    LABORATORY PANEL:   CBC  Recent Labs Lab 04/18/16 1814  WBC 6.9  HGB 10.7*  HCT 33.1*  PLT 252   ------------------------------------------------------------------------------------------------------------------  Chemistries   Recent Labs Lab 04/18/16 1814  NA 136  K 3.8  CL 102  CO2 27  GLUCOSE 88  BUN 14  CREATININE 1.17*  CALCIUM 8.6*  AST 24  ALT 10*  ALKPHOS 59  BILITOT 0.6   ------------------------------------------------------------------------------------------------------------------  Cardiac Enzymes  Recent Labs Lab 04/18/16 1814  TROPONINI <0.03   ------------------------------------------------------------------------------------------------------------------  RADIOLOGY:  Dg Chest Portable 1 View  Result Date: 04/18/2016 CLINICAL DATA:  Altered level of consciousness. Code stroke. Weakness. EXAM:  PORTABLE CHEST 1 VIEW COMPARISON:  03/27/2016 and previous FINDINGS: Chronic cardiomegaly. Chronic aortic atherosclerosis. Less pleural fluid and less atelectasis in the lower lungs. Upper lungs are clear. No worsening or new findings. IMPRESSION: Radiographic improvement with resolving effusions and much less basilar atelectasis. Electronically Signed   By: Nelson Chimes M.D.   On: 04/18/2016 18:36   Ct Head Code Stroke W/o Cm  Result Date: 04/18/2016 CLINICAL DATA:  Code stroke. Aphasia, last seen normal at 1532 hours. History of hypertension, hyperlipidemia and diabetes. EXAM: CT HEAD WITHOUT CONTRAST TECHNIQUE: Contiguous axial images were obtained from the base of the skull through the vertex without intravenous contrast. COMPARISON:  None. FINDINGS: BRAIN: No intraparenchymal hemorrhage, mass effect nor midline shift. The ventricles and sulci are normal for age. No acute large vascular territory infarct. Old moderate LEFT parietal lobe infarct. Patchy to confluent supratentorial white matter hypodensities. Old basal ganglia lacunar infarcts. Age indeterminate thalamus lacunar infarcts. Patchy supratentorial white matter hypodensities. VASCULAR: Moderate calcific atherosclerosis of  the carotid siphons. SKULL: No skull fracture. Severe LEFT temporomandibular osteoarthrosis. No significant scalp soft tissue swelling. SINUSES/ORBITS: The mastoid air-cells and included paranasal sinuses are well-aerated.The included ocular globes and orbital contents are non-suspicious. OTHER: Patient is edentulous. ASPECTS [facility] Stroke Program Early CT Score) - Ganglionic level infarction (caudate, lentiform nuclei, internal capsule, insula, M1-M3 cortex): 7 - Supraganglionic infarction (M4-M6 cortex): 3 Total score (0-10 with 10 being normal): 10 IMPRESSION: 1. No acute intracranial process. 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 Old basal ganglia and likely chronic bilateral thalamus infarcts. Moderate to severe chronic  small vessel ischemic disease. 2. ASPECTS is 10. Critical Value/emergent results were called by telephone at the time of interpretation on 04/18/2016 at 6:24 pm to Dr. Carrie Mew , who verbally acknowledged these results. Electronically Signed   By: Elon Alas M.D.   On: 04/18/2016 18:25    EKG:   Orders placed or performed during the hospital encounter of 04/18/16  . ED EKG  . ED EKG  . EKG 12-Lead  . EKG 12-Lead    IMPRESSION AND PLAN:  Principal Problem:   Aphasia - currently resolved, however we will workup the patient for stroke via order set with appropriate imaging and neurology consult Active Problems:   UTI (urinary tract infection) - very possibly a cause of her confusion and lethargy, antibiotics started and culture sent   Diabetes mellitus, type 2 (HCC) - sliding scale insulin with corresponding glucose checks   Essential hypertension - continue home meds   Chronic kidney disease (CKD), stage III (moderate) - stable, monitor and avoid nephrotoxins  All the records are reviewed and case discussed with ED provider. Management plans discussed with the patient and/or family.  DVT PROPHYLAXIS: Systemic anticoagulation  GI PROPHYLAXIS: None  ADMISSION STATUS: Inpatient  CODE STATUS: Full Code Status History    Date Active Date Inactive Code Status Order ID Comments User Context   03/27/2016  9:39 PM 03/29/2016  9:41 PM Full Code [id number]  Fritzi Mandes, MD ED   03/12/2016 12:21 PM 03/13/2016  8:32 PM Full Code [id number]  Dustin Flock, MD Inpatient   03/09/2016  4:33 PM 03/12/2016 12:21 PM DNR [id number]  Nicholes Mango, MD ED   02/26/2016  9:39 PM 03/01/2016 10:37 PM DNR [id number]  Karmen Bongo, MD Inpatient   02/26/2016  7:48 PM 02/26/2016  9:39 PM DNR [id number]  Dorie Rank, MD ED   01/31/2016  5:45 PM 02/07/2016 11:45 PM DNR [id number]  Flora Lipps, MD Inpatient   01/30/2016 11:19 PM 01/31/2016  5:44 PM Full Code [id number]  Harvie Bridge, DO Inpatient   11/26/2014  3:52 PM  11/28/2014  3:07 PM Full Code [id number]  Max Sane, MD Inpatient   11/26/2014  8:59 AM 11/26/2014  3:52 PM Full Code [id number]  Max Sane, MD ED    Advance Directive Documentation     Most Recent Value  Type of Advance Directive  Living will  Pre-existing out of facility DNR order (yellow form or pink MOST form)  -  "MOST" Form in Place?  -      TOTAL TIME TAKING CARE OF THIS PATIENT: 45 minutes.   Tylasia Fletchall [place] 04/18/2016, 11:12 PM  [facility] [facility]  Office  [phone]  CC: Primary care physician; [facility]  Note:  This document was prepared using Dragon voice recognition software and may include unintentional dictation errors.

## 2016-04-18 NOTE — ED Notes (Signed)
SOC    CALLED 

## 2016-04-18 NOTE — ED Notes (Signed)
Pt currently trying to have BM, pt ambulated with assistance to BR without difficulty.

## 2016-04-18 NOTE — ED Notes (Signed)
Pt bib EMS from [facility] w/ c/o AMS.  Per EMS, staff reported finding pt unresponsive in BR.  No CPR started per EMS, pt became alert but "not back to her normal".  Per EMS, pts baseline alert, talking and oriented to self.  Upon arrival pt nonverbal, will follow some commands such as lean forward or open mouth but does not answer question. PERRLA, and alert.  Afebrile, no n/v/d present.  Resp even and unlabored

## 2016-04-18 NOTE — ED Notes (Signed)
CODE STROKE CALLED TO 333 

## 2016-04-19 ENCOUNTER — Inpatient Hospital Stay: Payer: Medicare Other

## 2016-04-19 ENCOUNTER — Inpatient Hospital Stay (HOSPITAL_COMMUNITY)
Admit: 2016-04-19 | Discharge: 2016-04-19 | Disposition: A | Discharge status: Home / Self Care | Payer: Medicare Other | Attending: Internal Medicine | Admitting: Internal Medicine

## 2016-04-19 ENCOUNTER — Encounter: Payer: Self-pay | Admitting: Internal Medicine

## 2016-04-19 ENCOUNTER — Inpatient Hospital Stay: Admit: 2016-04-19 | Payer: Medicare Other

## 2016-04-19 DIAGNOSIS — G459 Transient cerebral ischemic attack, unspecified: Secondary | ICD-10-CM

## 2016-04-19 DIAGNOSIS — R4701 Aphasia: Secondary | ICD-10-CM

## 2016-04-19 LAB — CBC
HEMATOCRIT: 33.4 % — AB (ref 35.0–47.0)
HEMOGLOBIN: 10.9 g/dL — AB (ref 12.0–16.0)
MCH: 26.8 pg (ref 26.0–34.0)
MCHC: 32.8 g/dL (ref 32.0–36.0)
MCV: 81.8 fL (ref 80.0–100.0)
Platelets: 275 10*3/uL (ref 150–440)
RBC: 4.08 MIL/uL (ref 3.80–5.20)
RDW: 16.7 % — ABNORMAL HIGH (ref 11.5–14.5)
WBC: 13.2 10*3/uL — ABNORMAL HIGH (ref 3.6–11.0)

## 2016-04-19 LAB — BASIC METABOLIC PANEL
ANION GAP: 6 (ref 5–15)
BUN: 16 mg/dL (ref 6–20)
CO2: 29 mmol/L (ref 22–32)
Calcium: 8 mg/dL — ABNORMAL LOW (ref 8.9–10.3)
Chloride: 99 mmol/L — ABNORMAL LOW (ref 101–111)
Creatinine, Ser: 0.99 mg/dL (ref 0.44–1.00)
GFR calc Af Amer: 58 mL/min — ABNORMAL LOW (ref >60)
GFR calc non Af Amer: 50 mL/min — ABNORMAL LOW (ref >60)
GLUCOSE: 192 mg/dL — AB (ref 65–99)
POTASSIUM: 3.7 mmol/L (ref 3.5–5.1)
Sodium: 134 mmol/L — ABNORMAL LOW (ref 135–145)

## 2016-04-19 LAB — LIPID PANEL
CHOL/HDL RATIO: 1.8 ratio
CHOLESTEROL: 112 mg/dL (ref 0–200)
HDL: 61 mg/dL (ref >40)
LDL Cholesterol: 34 mg/dL (ref 0–99)
TRIGLYCERIDES: 83 mg/dL (ref <150)
VLDL: 17 mg/dL (ref 0–40)

## 2016-04-19 LAB — GLUCOSE, CAPILLARY
GLUCOSE-CAPILLARY: 173 mg/dL — AB (ref 65–99)
GLUCOSE-CAPILLARY: 139 mg/dL — AB (ref 65–99)

## 2016-04-19 MED ORDER — ATORVASTATIN CALCIUM 20 MG PO TABS
20.0000 mg | ORAL_TABLET | Freq: Every day | ORAL | Status: DC
  Administered 2016-04-20: 20 mg via ORAL
  Filled 2016-04-19: qty 1

## 2016-04-19 MED ORDER — ONDANSETRON HCL 4 MG/2ML IJ SOLN
INTRAMUSCULAR | Status: AC
  Administered 2016-04-19: 4 mg via INTRAVENOUS
  Filled 2016-04-19: qty 2

## 2016-04-19 MED ORDER — INSULIN DETEMIR 100 UNIT/ML ~~LOC~~ SOLN
12.0000 [IU] | Freq: Every morning | SUBCUTANEOUS | Status: DC
  Administered 2016-04-19: 18:00:00 12 [IU] via SUBCUTANEOUS
  Filled 2016-04-19 (×2): qty 0.12

## 2016-04-19 MED ORDER — DEXTROSE 5 % IV SOLN
1.0000 g | INTRAVENOUS | Status: DC
  Administered 2016-04-19 – 2016-04-20 (×2): 1 g via INTRAVENOUS
  Filled 2016-04-19 (×3): qty 10

## 2016-04-19 MED ORDER — GABAPENTIN 300 MG PO CAPS
300.0000 mg | ORAL_CAPSULE | Freq: Every day | ORAL | Status: DC
  Administered 2016-04-20: 300 mg via ORAL
  Filled 2016-04-19: qty 1

## 2016-04-19 MED ORDER — STROKE: EARLY STAGES OF RECOVERY BOOK
Freq: Once | Status: AC
  Administered 2016-04-19: 01:00:00

## 2016-04-19 MED ORDER — ONDANSETRON HCL 4 MG/2ML IJ SOLN
4.0000 mg | INTRAMUSCULAR | Status: DC | PRN
  Administered 2016-04-19 (×3): 4 mg via INTRAVENOUS
  Filled 2016-04-19 (×3): qty 2

## 2016-04-19 MED ORDER — CEFTRIAXONE SODIUM-DEXTROSE 1-3.74 GM-% IV SOLR
1.0000 g | INTRAVENOUS | Status: DC
  Filled 2016-04-19: qty 50

## 2016-04-19 MED ORDER — ONDANSETRON HCL 4 MG PO TABS: 4.0000 mg | ORAL_TABLET | ORAL | Status: DC | PRN

## 2016-04-19 MED ORDER — INSULIN ASPART 100 UNIT/ML ~~LOC~~ SOLN
0.0000 [IU] | Freq: Three times a day (TID) | SUBCUTANEOUS | Status: DC
  Administered 2016-04-19: 2 [IU] via SUBCUTANEOUS
  Administered 2016-04-20 (×2): 1 [IU] via SUBCUTANEOUS
  Administered 2016-04-21: 12:00:00 5 [IU] via SUBCUTANEOUS
  Administered 2016-04-21: 1 [IU] via SUBCUTANEOUS
  Administered 2016-04-21: 3 [IU] via SUBCUTANEOUS
  Filled 2016-04-19: qty 3
  Filled 2016-04-19: qty 5
  Filled 2016-04-19: qty 1
  Filled 2016-04-19: qty 2
  Filled 2016-04-19 (×2): qty 1

## 2016-04-19 MED ORDER — ONDANSETRON HCL 4 MG PO TABS: 4.0000 mg | ORAL_TABLET | Freq: Four times a day (QID) | ORAL | Status: DC | PRN

## 2016-04-19 MED ORDER — PANTOPRAZOLE SODIUM 40 MG PO TBEC: 40.0000 mg | DELAYED_RELEASE_TABLET | Freq: Every day | ORAL | Status: DC

## 2016-04-19 MED ORDER — SODIUM CHLORIDE 0.9 % IV SOLN
INTRAVENOUS | Status: DC
  Administered 2016-04-19: 08:00:00 via INTRAVENOUS

## 2016-04-19 MED ORDER — SODIUM CHLORIDE 0.9% FLUSH
3.0000 mL | Freq: Two times a day (BID) | INTRAVENOUS | Status: DC
  Administered 2016-04-19 – 2016-04-21 (×5): 3 mL via INTRAVENOUS

## 2016-04-19 MED ORDER — PANTOPRAZOLE SODIUM 40 MG IV SOLR
40.0000 mg | Freq: Two times a day (BID) | INTRAVENOUS | Status: DC
  Administered 2016-04-19 – 2016-04-21 (×6): 40 mg via INTRAVENOUS
  Filled 2016-04-19 (×6): qty 40

## 2016-04-19 MED ORDER — ACETAMINOPHEN 650 MG RE SUPP: 650.0000 mg | Freq: Four times a day (QID) | RECTAL | Status: DC | PRN

## 2016-04-19 MED ORDER — SODIUM CHLORIDE 0.9 % IV BOLUS (SEPSIS)
1000.0000 mL | Freq: Once | INTRAVENOUS | Status: AC
  Administered 2016-04-19: 06:00:00 1000 mL via INTRAVENOUS

## 2016-04-19 MED ORDER — APIXABAN 5 MG PO TABS: 5.0000 mg | ORAL_TABLET | Freq: Two times a day (BID) | ORAL | Status: DC

## 2016-04-19 MED ORDER — AMLODIPINE BESYLATE 5 MG PO TABS
5.0000 mg | ORAL_TABLET | Freq: Every day | ORAL | Status: DC
  Administered 2016-04-19 – 2016-04-21 (×3): 5 mg via ORAL
  Filled 2016-04-19 (×3): qty 1

## 2016-04-19 MED ORDER — IOPAMIDOL (ISOVUE-370) INJECTION 76%
100.0000 mL | Freq: Once | INTRAVENOUS | Status: AC | PRN
  Administered 2016-04-19: 80 mL via INTRAVENOUS

## 2016-04-19 MED ORDER — ONDANSETRON HCL 4 MG/2ML IJ SOLN
4.0000 mg | Freq: Four times a day (QID) | INTRAMUSCULAR | Status: DC | PRN
  Administered 2016-04-19: 4 mg via INTRAVENOUS

## 2016-04-19 MED ORDER — ACETAMINOPHEN 325 MG PO TABS: 650.0000 mg | ORAL_TABLET | Freq: Four times a day (QID) | ORAL | Status: DC | PRN

## 2016-04-19 NOTE — ED Notes (Signed)
Pt gown and linens changed, pt placed in brief and pt given zofran for emesis

## 2016-04-19 NOTE — Plan of Care (Signed)
Problem: Fluid Volume: Goal: Ability to maintain a balanced intake and output will improve Outcome: Not Progressing Vomited coffee-ground emesis. Protonix IV given along with Zofran. Pt still vomited. Notified MD twice on this matter. Currently, NS bolus infusing then maintenance fluids to continue at 100 ml/hr. Awaiting am labs.   Problem: Nutrition: Goal: Risk of aspiration will decrease Outcome: Not Progressing Pt encouraged to turn to her side with head elevated while vomiting.

## 2016-04-19 NOTE — Progress Notes (Signed)
PT Cancellation Note  Patient Details Name: Sherry [name] MRN: [id number] DOB: 09-04-1927   Cancelled Treatment:    Reason Eval/Treat Not Completed: Medical issues which prohibited therapy; Pt with frequent bouts of nausea and vomiting this date with nursing aware and requesting PT hold until pt's symptoms have resolved.  Will attempt to evaluate pt at a future date/time as appropriate.     Blanche [place] 04/19/2016, 11:03 AM

## 2016-04-19 NOTE — Progress Notes (Signed)
SLP Cancellation Note  Patient Details Name: Sherry [name] MRN: [id number] DOB: [birth year]   Cancelled treatment:       Reason Eval/Treat Not Completed: Fatigue/lethargy limiting ability to participate (chart reviewed; consulted NSG then pt). Pt awakened easily w/ verbal stim. Pt verbally indicated she "did not feel well right now" and said "thank you" when SLP offered to return tomorrow for any cognitive-linguistic assessment. Pt's speech was clear but minimal - suspect d/t not feeling well overall. [facility] staff also indicated that pt was going for a test this afternoon.  ST services will f/u tomorrow w/ pt's status.   Orinda Kenner, MS, CCC-SLP Watson,Katherine 04/19/2016, 2:36 PM

## 2016-04-19 NOTE — Progress Notes (Signed)
[facility] NAME: Sherry [name]    MR#:  [id number]  DATE OF BIRTH:  1927-04-10  SUBJECTIVE:  CHIEF COMPLAINT:   Chief Complaint  Patient presents with  . Code Stroke   Had vomiting earlier. Feels weak. More awake. Afebrile. Has a Foley catheter placed as outpatient. Due to urinary retention.  REVIEW OF SYSTEMS:    Review of Systems  Constitutional: Positive for malaise/fatigue. Negative for chills and fever.  HENT: Negative for sore throat.   Eyes: Negative for blurred vision, double vision and pain.  Respiratory: Negative for cough, hemoptysis, shortness of breath and wheezing.   Cardiovascular: Negative for chest pain, palpitations, orthopnea and leg swelling.  Gastrointestinal: Negative for abdominal pain, constipation, diarrhea, heartburn, nausea and vomiting.  Genitourinary: Negative for dysuria and hematuria.  Musculoskeletal: Negative for back pain and joint pain.  Skin: Negative for rash.  Neurological: Positive for weakness. Negative for sensory change, speech change, focal weakness and headaches.  Endo/Heme/Allergies: Does not bruise/bleed easily.  Psychiatric/Behavioral: Negative for depression. The patient is not nervous/anxious.     DRUG ALLERGIES:  No Known Allergies  VITALS:  Blood pressure (!) 137/52, pulse 93, temperature 98.9 F (37.2 C), temperature source Oral, resp. rate 18, height 5\' 4"  (1.626 m), weight 66.4 kg (146 lb 6.4 oz), SpO2 100 %.  PHYSICAL EXAMINATION:   Physical Exam  GENERAL:  83 y.o.-year-old patient lying in the bed with no acute distress.  EYES: Pupils equal, round, reactive to light and accommodation. No scleral icterus. Extraocular muscles intact.  HEENT: Head atraumatic, normocephalic. Oropharynx and nasopharynx clear.  NECK:  Supple, no jugular venous distention. No thyroid enlargement, no tenderness.  LUNGS: Normal breath sounds bilaterally, no wheezing, rales, rhonchi. No use of  accessory muscles of respiration.  CARDIOVASCULAR: S1, S2 normal. No murmurs, rubs, or gallops.  ABDOMEN: Soft. Diffuse tenderness on deep palpation. Foley catheter in place EXTREMITIES: No cyanosis, clubbing or edema b/l.    NEUROLOGIC: Cranial nerves II through XII are intact. No focal Motor or sensory deficits b/l.   PSYCHIATRIC: The patient is alert and oriented SKIN: No obvious rash, lesion, or ulcer.   LABORATORY PANEL:   CBC  Recent Labs Lab 04/19/16 0806  WBC 13.2*  HGB 10.9*  HCT 33.4*  PLT 275   ------------------------------------------------------------------------------------------------------------------ Chemistries   Recent Labs Lab 04/18/16 1814 04/19/16 0806  NA 136 134*  K 3.8 3.7  CL 102 99*  CO2 27 29  GLUCOSE 88 192*  BUN 14 16  CREATININE 1.17* 0.99  CALCIUM 8.6* 8.0*  AST 24  --   ALT 10*  --   ALKPHOS 59  --   BILITOT 0.6  --    ------------------------------------------------------------------------------------------------------------------  Cardiac Enzymes  Recent Labs Lab 04/18/16 1814  TROPONINI <0.03   ------------------------------------------------------------------------------------------------------------------  RADIOLOGY:  US Carotid Bilateral (at [facility] Only)  Result Date: 04/19/2016 CLINICAL DATA:  83 year old female with a history of aphasia. Cardiovascular risk factors include diabetes EXAM: BILATERAL CAROTID DUPLEX ULTRASOUND TECHNIQUE: Pearline Cables scale imaging, color Doppler and duplex ultrasound were performed of bilateral carotid and vertebral arteries in the neck. COMPARISON:  No prior duplex FINDINGS: Criteria: Quantification of carotid stenosis is based on velocity parameters that correlate the residual internal carotid diameter with NASCET-based stenosis levels, using the diameter of the distal internal carotid lumen as the denominator for stenosis measurement. The following velocity measurements were obtained: RIGHT  ICA:  Systolic 448 cm/sec, Diastolic 26 cm/sec CCA:  89 cm/sec SYSTOLIC ICA/CCA RATIO:  1.9 ECA:  117 cm/sec LEFT ICA:  Systolic 408 cm/sec, Diastolic 10 cm/sec CCA:  71 cm/sec SYSTOLIC ICA/CCA RATIO:  1.8 ECA:  131 cm/sec Right Brachial SBP: Not acquired Left Brachial SBP: Not acquired RIGHT CAROTID ARTERY: No significant calcifications of the right common carotid artery. Intermediate waveform maintained. Heterogeneous and partially calcified plaque at the right carotid bifurcation. No significant lumen shadowing. Low resistance waveform of the right ICA. No significant tortuosity. RIGHT VERTEBRAL ARTERY: Antegrade flow with low resistance waveform. LEFT CAROTID ARTERY: No significant calcifications of the left common carotid artery. Intermediate waveform maintained. Heterogeneous and partially calcified plaque at the left carotid bifurcation without significant lumen shadowing. Low resistance waveform of the left ICA. No significant tortuosity. LEFT VERTEBRAL ARTERY:  Antegrade flow with low resistance waveform. IMPRESSION: Right: Heterogeneous and partially calcified plaque at the right carotid bifurcation, with discordant results regarding degree of stenosis by established duplex criteria. Peak velocity suggests 50%- 69% stenosis, with the ICA/ CCA ratio suggesting a lesser degree of stenosis. If establishing a more accurate degree of stenosis is required, cerebral angiogram should be considered, or as a second best test, CTA. Left: Color duplex indicates minimal heterogeneous and calcified plaque, with no hemodynamically significant stenosis by duplex criteria in the extracranial cerebrovascular circulation. Signed, Dulcy Fanny. Earleen Newport, DO [facility] Electronically Signed   By: Corrie Mckusick D.O.   On: 04/19/2016 12:33   Dg Chest Portable 1 View  Result Date: 04/18/2016 CLINICAL DATA:  Altered level of consciousness. Code stroke. Weakness. EXAM:  PORTABLE CHEST 1 VIEW COMPARISON:  03/27/2016 and previous FINDINGS: Chronic cardiomegaly. Chronic aortic atherosclerosis. Less pleural fluid and less atelectasis in the lower lungs. Upper lungs are clear. No worsening or new findings. IMPRESSION: Radiographic improvement with resolving effusions and much less basilar atelectasis. Electronically Signed   By: Nelson Chimes M.D.   On: 04/18/2016 18:36   Ct Head Code Stroke W/o Cm  Result Date: 04/18/2016 CLINICAL DATA:  Code stroke. Aphasia, last seen normal at 1532 hours. History of hypertension, hyperlipidemia and diabetes. EXAM: CT HEAD WITHOUT CONTRAST TECHNIQUE: Contiguous axial images were obtained from the base of the skull through the vertex without intravenous contrast. COMPARISON:  None. FINDINGS: BRAIN: No intraparenchymal hemorrhage, mass effect nor midline shift. The ventricles and sulci are normal for age. No acute large vascular territory infarct. Old moderate LEFT parietal lobe infarct. Patchy to confluent supratentorial white matter hypodensities. Old basal ganglia lacunar infarcts. Age indeterminate thalamus lacunar infarcts. Patchy supratentorial white matter hypodensities. VASCULAR: Moderate calcific atherosclerosis of the carotid siphons. SKULL: No skull fracture. Severe LEFT temporomandibular osteoarthrosis. No significant scalp soft tissue swelling. SINUSES/ORBITS: The mastoid air-cells and included paranasal sinuses are well-aerated.The included ocular globes and orbital contents are non-suspicious. OTHER: Patient is edentulous. ASPECTS [facility] Stroke Program Early CT Score) - Ganglionic level infarction (caudate, lentiform nuclei, internal capsule, insula, M1-M3 cortex): 7 - Supraganglionic infarction (M4-M6 cortex): 3 Total score (0-10 with 10 being normal): 10 IMPRESSION: 1. No acute intracranial process. 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 Old basal ganglia and likely chronic bilateral thalamus infarcts. Moderate to severe chronic  small vessel ischemic disease. 2. ASPECTS is 10. Critical Value/emergent results were called by telephone at the time of interpretation on 04/18/2016 at 6:24 pm to Dr. Carrie Mew , who verbally acknowledged these results. Electronically Signed   By: Elon Alas M.D.   On: 04/18/2016 18:25     ASSESSMENT AND PLAN:   *  TIA versus acute CVA with aphasia Symptoms resolve. Waiting for MRI. Carotid Dopplers. Echocardiogram. On Eliquis.  * UTI with acute encephalopathy On IV ceftriaxone. Cultures pending. Check CT scan of the abdomen to rule out pyelonephritis. She did have a right renal mass on recent CT scan of the abdomen. Needs follow-up.  * Diabetes mellitus. Continue patient's Levemir at reduced dose. Sliding scale insulin.  * History of pulmonary embolism on Eliquis  All the records are reviewed and case discussed with Care Management/Social Worker Management plans discussed with the patient, family and they are in agreement.  CODE STATUS: FULL CODE  DVT Prophylaxis: SCDs  TOTAL TIME TAKING CARE OF THIS PATIENT: 35 minutes.   POSSIBLE D/C IN 2-3 DAYS, DEPENDING ON CLINICAL CONDITION.  Hillary Bow R M.D on 04/19/2016 at 2:36 PM  Between 7am to 6pm - Pager - [phone]  After 6pm go to [email or web address] - password [email or web address] [facility]  Office  [phone]  CC: Primary care physician; [employer]  Note: This dictation was prepared with Dragon dictation along with smaller phrase technology. Any transcriptional errors that result from this process are unintentional.

## 2016-04-19 NOTE — Consult Note (Signed)
Reason for Consult:confusion  Referring Physician: Dr. Darvin Neighbours  CC: Confusion/unresponsive 83 y.o. female with hx of HTN, CKD, anemia, PE on eliquis who presents with snresponsiveness, initially felt in the ED to have a aphasia. Code stroke process initiated and hospitals were called for admission. On workup he was found to have a significant UTI on UA. Currently pt appears to be at baseline.    Past Medical History:  Diagnosis Date  . Diabetes mellitus without complication ([place])   . Hyperlipidemia   . Hypertension     Past Surgical History:  Procedure Laterality Date  . APPENDECTOMY    . BACK SURGERY    . SHOULDER SURGERY      Family History  Problem Relation Age of Onset  . Diabetes Mother   . Bladder Cancer Neg Hx   . Kidney cancer Neg Hx     Social History:  reports that she has never smoked. She has never used smokeless tobacco. She reports that she does not drink alcohol or use drugs.  No Known Allergies  Medications: I have reviewed the patient's current medications.  ROS: History obtained from the patient  General ROS: negative for - chills, fatigue, fever, night sweats, weight gain or weight loss Psychological ROS: negative for - behavioral disorder, hallucinations, memory difficulties, mood swings or suicidal ideation Ophthalmic ROS: negative for - blurry vision, double vision, eye pain or loss of vision ENT ROS: negative for - epistaxis, nasal discharge, oral lesions, sore throat, tinnitus or vertigo Allergy and Immunology ROS: negative for - hives or itchy/watery eyes Hematological and Lymphatic ROS: negative for - bleeding problems, bruising or swollen lymph nodes Endocrine ROS: negative for - galactorrhea, hair pattern changes, polydipsia/polyuria or temperature intolerance Respiratory ROS: negative for - cough, hemoptysis, shortness of breath or wheezing Cardiovascular ROS: negative for - chest pain, dyspnea on exertion, edema or irregular  heartbeat Gastrointestinal ROS: negative for - abdominal pain, diarrhea, hematemesis, nausea/vomiting or stool incontinence Genito-Urinary ROS: negative for - dysuria, hematuria, incontinence or urinary frequency/urgency Musculoskeletal ROS: negative for - joint swelling or muscular weakness Neurological ROS: as noted in HPI Dermatological ROS: negative for rash and skin lesion changes  Physical Examination: Blood pressure (!) 148/53, pulse 90, temperature 98.1 F (36.7 C), temperature source Oral, resp. rate 18, height 5\' 4"  (1.626 m), weight 66.4 kg (146 lb 6.4 oz), SpO2 100 %.  HEENT-  Normocephalic, no lesions, without obvious abnormality.  Normal external eye and conjunctiva.  Normal TM's bilaterally.  Normal auditory canals and external ears. Normal external nose, mucus membranes and septum.  Normal pharynx. Cardiovascular- regular rate and rhythm, pulses palpable throughout   Lungs- Heart exam - S1, S2 normal, no murmur, no gallop, rate regular Abdomen- soft, non-tender; bowel sounds normal; no masses,  no organomegaly Extremities- less then 2 second capillary refill Lymph-no adenopathy palpable Musculoskeletal-no joint tenderness, deformity or swelling Skin-warm and dry, no hyperpigmentation, vitiligo, or suspicious lesions  Neurological Examination   Mental Status: Alert, oriented, thought content appropriate.  Speech fluent without evidence of aphasia.  Able to follow 3 step commands without difficulty. Cranial Nerves: II: Discs flat bilaterally; Visual fields grossly normal, pupils equal, round, reactive to light and accommodation III,IV, VI: ptosis not present, extra-ocular motions intact bilaterally V,VII: smile symmetric, facial light touch sensation normal bilaterally VIII: hearing normal bilaterally IX,X: gag reflex present XI: bilateral shoulder shrug XII: midline tongue extension Motor: Right : Upper extremity   5-/5    Left:     Upper extremity  5-/5  Lower  extremity   4+/5     Lower extremity   4+/5 Tone and bulk:normal tone throughout; no atrophy noted Sensory: Pinprick and light touch intact throughout, bilaterally Deep Tendon Reflexes: 1+ and symmetric throughout Plantars: Right: downgoing   Left: downgoing Cerebellar: normal finger-to-nose, normal rapid alternating movements and normal heel-to-shin test Gait: not tested      Laboratory Studies:   Basic Metabolic Panel:  Recent Labs Lab 04/18/16 1814 04/19/16 0806  NA 136 134*  K 3.8 3.7  CL 102 99*  CO2 27 29  GLUCOSE 88 192*  BUN 14 16  CREATININE 1.17* 0.99  CALCIUM 8.6* 8.0*    Liver Function Tests:  Recent Labs Lab 04/18/16 1814  AST 24  ALT 10*  ALKPHOS 59  BILITOT 0.6  PROT 5.8*  ALBUMIN 2.9*   No results for input(s): LIPASE, AMYLASE in the last 168 hours. No results for input(s): AMMONIA in the last 168 hours.  CBC:  Recent Labs Lab 04/18/16 1814 04/19/16 0806  WBC 6.9 13.2*  NEUTROABS 3.6  --   HGB 10.7* 10.9*  HCT 33.1* 33.4*  MCV 84.9 81.8  PLT 252 275    Cardiac Enzymes:  Recent Labs Lab 04/18/16 1814  TROPONINI <0.03    BNP: Invalid input(s): POCBNP  CBG:  Recent Labs Lab 04/18/16 1816 04/18/16 2152 04/18/16 2351  GLUCAP 83 114* 129*    Microbiology: Results for orders placed or performed during the hospital encounter of 03/27/16  Urine culture     Status: None   Collection Time: 03/27/16  6:20 PM  Result Value Ref Range Status   Specimen Description URINE, RANDOM  Final   Special Requests NONE  Final   Culture   Final    NO GROWTH Performed at [facility], [place] [address] [place], [place] [address]    Report Status 03/29/2016 FINAL  Final  MRSA PCR Screening     Status: None   Collection Time: 03/27/16 11:06 PM  Result Value Ref Range Status   MRSA by PCR NEGATIVE NEGATIVE Final    Comment:        The GeneXpert MRSA Assay (FDA approved for NASAL specimens only), is one component of  a comprehensive MRSA colonization surveillance program. It is not intended to diagnose MRSA infection nor to guide or monitor treatment for MRSA infections.     Coagulation Studies:  Recent Labs  04/18/16 1814  LABPROT 14.8  INR 1.15    Urinalysis:  Recent Labs Lab 04/18/16 2005  COLORURINE AMBER*  LABSPEC 1.016  PHURINE 8.0  GLUCOSEU NEGATIVE  HGBUR NEGATIVE  BILIRUBINUR SMALL*  KETONESUR NEGATIVE  PROTEINUR 100*  NITRITE POSITIVE*  LEUKOCYTESUR LARGE*    Lipid Panel:     Component Value Date/Time   CHOL 112 04/19/2016 0806   TRIG 83 04/19/2016 0806   HDL 61 04/19/2016 0806   CHOLHDL 1.8 04/19/2016 0806   VLDL 17 04/19/2016 0806   LDLCALC 34 04/19/2016 0806    HgbA1C:  Lab Results  Component Value Date   HGBA1C 5.8 11/26/2014    Urine Drug Screen:  No results found for: LABOPIA, COCAINSCRNUR, LABBENZ, AMPHETMU, THCU, LABBARB  Alcohol Level: No results for input(s): ETH in the last 168 hours.  Other results: EKG: normal EKG, normal sinus rhythm, unchanged from previous tracings.  Imaging: Dg Chest Portable 1 View  Result Date: 04/18/2016 CLINICAL DATA:  Altered level of consciousness. Code stroke. Weakness. EXAM: PORTABLE CHEST 1 VIEW COMPARISON:  03/27/2016 and previous FINDINGS: Chronic cardiomegaly. Chronic aortic atherosclerosis. Less pleural fluid and less atelectasis in the lower lungs. Upper lungs are clear. No worsening or new findings. IMPRESSION: Radiographic improvement with resolving effusions and much less basilar atelectasis. Electronically Signed   By: Nelson Chimes M.D.   On: 04/18/2016 18:36   Ct Head Code Stroke W/o Cm  Result Date: 04/18/2016 CLINICAL DATA:  Code stroke. Aphasia, last seen normal at 1532 hours. History of hypertension, hyperlipidemia and diabetes. EXAM: CT HEAD WITHOUT CONTRAST TECHNIQUE: Contiguous axial images were obtained from the base of the skull through the vertex without intravenous contrast. COMPARISON:   None. FINDINGS: BRAIN: No intraparenchymal hemorrhage, mass effect nor midline shift. The ventricles and sulci are normal for age. No acute large vascular territory infarct. Old moderate LEFT parietal lobe infarct. Patchy to confluent supratentorial white matter hypodensities. Old basal ganglia lacunar infarcts. Age indeterminate thalamus lacunar infarcts. Patchy supratentorial white matter hypodensities. VASCULAR: Moderate calcific atherosclerosis of the carotid siphons. SKULL: No skull fracture. Severe LEFT temporomandibular osteoarthrosis. No significant scalp soft tissue swelling. SINUSES/ORBITS: The mastoid air-cells and included paranasal sinuses are well-aerated.The included ocular globes and orbital contents are non-suspicious. OTHER: Patient is edentulous. ASPECTS [facility] Stroke Program Early CT Score) - Ganglionic level infarction (caudate, lentiform nuclei, internal capsule, insula, M1-M3 cortex): 7 - Supraganglionic infarction (M4-M6 cortex): 3 Total score (0-10 with 10 being normal): 10 IMPRESSION: 1. No acute intracranial process. 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 Old basal ganglia and likely chronic bilateral thalamus infarcts. Moderate to severe chronic small vessel ischemic disease. 2. ASPECTS is 10. Critical Value/emergent results were called by telephone at the time of interpretation on 04/18/2016 at 6:24 pm to Dr. Carrie Mew , who verbally acknowledged these results. Electronically Signed   By: Elon Alas M.D.   On: 04/18/2016 18:25     Assessment/Plan:  83 y.o. female with hx of HTN, CKD, anemia, PE on eliquis who presents with snresponsiveness, initially felt in the ED to have a aphasia. Code stroke process initiated and hospitals were called for admission. On workup he was found to have a significant UTI on UA. Currently pt appears to be at baseline.  CTH no acute abnormalities  - Not convinced stroke as nearly complete improvement - would hold of MRI or further  imaging - UTI treatment - call with questions.   - con't eliquis   04/19/2016, 11:58 AM

## 2016-04-19 NOTE — Progress Notes (Signed)
Pharmacy Antibiotic Note  EDDA OREA is a 83 y.o. female admitted on 04/18/2016 with UTI.  Pharmacy has been consulted for ceftriaxone dosing.  Plan: Patient received CTX 1g IV x 1 in ED  Will initiate CTX 1g IV daily for UTI  Height: 5\' 4"  (162.6 cm) Weight: 146 lb 6.4 oz (66.4 kg) IBW/kg (Calculated) : 54.7  Temp (24hrs), Avg:97.5 F (36.4 C), Min:97.4 F (36.3 C), Max:97.6 F (36.4 C)   Recent Labs Lab 04/18/16 1814  WBC 6.9  CREATININE 1.17*    Estimated Creatinine Clearance: 32.4 mL/min (A) (by C-G formula based on SCr of 1.17 mg/dL (H)).    No Known Allergies   Thank you for allowing pharmacy to be a part of this patient's care.  Tobie Lords, PharmD, BCPS Clinical Pharmacist 04/19/2016

## 2016-04-19 NOTE — Evaluation (Signed)
Occupational Therapy Evaluation Patient Details Name: Sherry [name] MRN: [id number] DOB: 1927/02/24 Today's Date: 04/19/2016    History of Present Illness 83 y.o. female who presents with Unresponsiveness, initially felt in the ED to have aphasia. Code stroke process initiated and hospitals were called for admission. On workup she was found to have a significant UTI on UA. Her neurological symptoms improved greatly while in the ED   Clinical Impression   Pt seen for OT evaluation this date. Pt agreeable to session, alert, responds appropriately to questions asked, however OT unclear about accuracy of information provided. Pt oriented to date, place, and self, but then reports living with son who helps her with her medications. However, chart review indicates pt lives in [facility] No family member present to confirm. Pt reports using RW for ambulation and independent with bathing, dressing. Pt notified OT that she "needed to be changed", aware she had a BM. CNA notified, assisted with bathing, toileting, and changing dressing to sacrum. Pt able to participate with rolling side to side with moderate assist required and verbal cues for hand placement on bed rails. Pt became nauseous, vomiting into emesis bag. CNA assisted with perineal care after second very loose BM. RN came to administer IV medications, checked emesis for "coffee grounds" as previously noted in pt chart. Additional functional mobility/ADL deferred due to pt nausea and vomiting. Pt would benefit from additional skilled OT services to address decreased strength, activity tolerance, and functional deficits in self care to maximize return to PLOF, minimize need for assist/burden of care, and minimize falls risk.    Follow Up Recommendations  SNF    Equipment Recommendations  Other (comment) (defer to next venue of care)    Recommendations for Other Services       Precautions / Restrictions Precautions Precautions:  Fall Restrictions Weight Bearing Restrictions: No      Mobility Bed Mobility Overal bed mobility: Needs Assistance Bed Mobility: Rolling Rolling: Mod assist         General bed mobility comments: with verbal cues for hand placement on bed rails to assist  Transfers                 General transfer comment: deferred this session due to safety, pt nauseated and throwing up    Balance                                           ADL either performed or assessed with clinical judgement   ADL Overall ADL's : Needs assistance/impaired     Grooming: Wash/dry face;Set up;Bed level       Lower Body Bathing: Bed level;Total assistance   Upper Body Dressing : Moderate assistance;Bed level   Lower Body Dressing: Maximal assistance;Bed level                 General ADL Comments: pt generally Max A for LB ADL at time of evaluation     Vision Baseline Vision/History: Wears glasses Wears Glasses: Reading only Patient Visual Report: No change from baseline Vision Assessment?: No apparent visual deficits     Perception     Praxis Praxis Praxis tested?: Within functional limits    Pertinent Vitals/Pain Pain Assessment: No/denies pain     Hand Dominance Right   Extremity/Trunk Assessment Upper Extremity Assessment Upper Extremity Assessment: Generalized weakness (grossly 4-/5, ROM WFL )  Lower Extremity Assessment Lower Extremity Assessment: Defer to PT evaluation       Communication Communication Communication: HOH   Cognition Arousal/Alertness: Awake/alert Behavior During Therapy: WFL for tasks assessed/performed Overall Cognitive Status: No family/caregiver present to determine baseline cognitive functioning                                 General Comments: pt oriented to day of the week, place, and self; reports living with son but chart review indicates she lives at [place]  pt  reports soreness in perineal area while nursing staff performed hygiene, barrier cream applied, RN notified    Exercises     Shoulder Instructions      Home Living Family/patient expects to be discharged to:: Skilled nursing facility Living Arrangements: Other (Comment) (pt lives in [facility])                               Additional Comments: Pt reports living with son however, chart review indicates she lives at [facility]      Prior Functioning/Environment Level of Independence: Needs assistance  Gait / Transfers Assistance Needed: per pt, she ambulates with 2WW, chart review indicates pt possibly using rollator, no family present to confirm ADL's / Homemaking Assistance Needed: pt reports independent with bathing, dressing, cooking/cleaning, however unable to confirm; pt reports it's been approx 1 yr since she last drove and does not drive anymore   Comments: per chart review, pt was indep with bathing, dressing, cooking approx 2 months ago, was not driving        OT Problem List: Decreased strength;Decreased cognition;Decreased activity tolerance;Decreased safety awareness;Decreased knowledge of use of DME or AE      OT Treatment/Interventions: Self-care/ADL training;Energy conservation;DME and/or AE instruction;Patient/family education;Therapeutic exercise;Therapeutic activities    OT Goals(Current goals can be found in the care plan section) Acute Rehab OT Goals Patient Stated Goal: feel better OT Goal Formulation: With patient Time For Goal Achievement: 05/03/16 Potential to Achieve Goals: Good  OT Frequency: Min 1X/week   Barriers to D/C:            Co-evaluation              End of Session    Activity Tolerance: Treatment limited secondary to medical complications (Comment) (pt very nauseated, vomiting ) Patient left: in bed;with call bell/phone within reach;with bed alarm set;Other (comment) (HOB elevated with emesis bag next to  pt head)  OT Visit Diagnosis: Muscle weakness (generalized) (M62.81);History of falling (Z91.81);Other abnormalities of gait and mobility (R26.89)                Time: 5631-4970 OT Time Calculation (min): 27 min Charges:  OT General Charges $OT Visit: 1 Procedure OT Evaluation $OT Eval Moderate Complexity: 1 Procedure OT Treatments $Self Care/Home Management : 8-22 mins G-Codes:     Jeni Salles, MPH, MS, OTR/L ascom [phone] 04/19/16, 10:05 AM

## 2016-04-20 ENCOUNTER — Ambulatory Visit: Admission: RE | Admit: 2016-04-20 | Payer: Medicare Other | Source: Ambulatory Visit

## 2016-04-20 LAB — CBC WITH DIFFERENTIAL/PLATELET
BASOS PCT: 0 %
Basophils Absolute: 0 10*3/uL (ref 0–0.1)
EOS ABS: 0 10*3/uL (ref 0–0.7)
Eosinophils Relative: 0 %
HCT: 31.6 % — ABNORMAL LOW (ref 35.0–47.0)
HEMOGLOBIN: 10.2 g/dL — AB (ref 12.0–16.0)
Lymphocytes Relative: 13 %
Lymphs Abs: 1.4 10*3/uL (ref 1.0–3.6)
MCH: 27 pg (ref 26.0–34.0)
MCHC: 32.2 g/dL (ref 32.0–36.0)
MCV: 84 fL (ref 80.0–100.0)
Monocytes Absolute: 0.7 10*3/uL (ref 0.2–0.9)
Monocytes Relative: 6 %
NEUTROS PCT: 81 %
Neutro Abs: 8.7 10*3/uL — ABNORMAL HIGH (ref 1.4–6.5)
Platelets: 284 10*3/uL (ref 150–440)
RBC: 3.76 MIL/uL — AB (ref 3.80–5.20)
RDW: 16.8 % — ABNORMAL HIGH (ref 11.5–14.5)
WBC: 10.7 10*3/uL (ref 3.6–11.0)

## 2016-04-20 LAB — BASIC METABOLIC PANEL
ANION GAP: 6 (ref 5–15)
BUN: 17 mg/dL (ref 6–20)
CALCIUM: 8.2 mg/dL — AB (ref 8.9–10.3)
CO2: 29 mmol/L (ref 22–32)
Chloride: 100 mmol/L — ABNORMAL LOW (ref 101–111)
Creatinine, Ser: 1.11 mg/dL — ABNORMAL HIGH (ref 0.44–1.00)
GFR, EST AFRICAN AMERICAN: 51 mL/min — AB (ref >60)
GFR, EST NON AFRICAN AMERICAN: 44 mL/min — AB (ref >60)
Glucose, Bld: 149 mg/dL — ABNORMAL HIGH (ref 65–99)
Potassium: 3.3 mmol/L — ABNORMAL LOW (ref 3.5–5.1)
Sodium: 135 mmol/L (ref 135–145)

## 2016-04-20 LAB — HEMOGLOBIN A1C
Hgb A1c MFr Bld: 5.9 % — ABNORMAL HIGH (ref 4.8–5.6)
Mean Plasma Glucose: 123 mg/dL

## 2016-04-20 LAB — GLUCOSE, CAPILLARY
GLUCOSE-CAPILLARY: 33 mg/dL — AB (ref 65–99)
GLUCOSE-CAPILLARY: 146 mg/dL — AB (ref 65–99)
GLUCOSE-CAPILLARY: 188 mg/dL — AB (ref 65–99)
Glucose-Capillary: 49 mg/dL — ABNORMAL LOW (ref 65–99)
Glucose-Capillary: 126 mg/dL — ABNORMAL HIGH (ref 65–99)
Glucose-Capillary: 139 mg/dL — ABNORMAL HIGH (ref 65–99)

## 2016-04-20 LAB — ECHOCARDIOGRAM COMPLETE
Height: 64 in
Weight: 2342.4 oz

## 2016-04-20 LAB — MAGNESIUM: MAGNESIUM: 1.7 mg/dL (ref 1.7–2.4)

## 2016-04-20 MED ORDER — DEXTROSE 50 % IV SOLN
25.0000 mL | Freq: Once | INTRAVENOUS | Status: AC
  Administered 2016-04-20: 25 mL via INTRAVENOUS

## 2016-04-20 MED ORDER — INSULIN DETEMIR 100 UNIT/ML ~~LOC~~ SOLN: 5.0000 [IU] | Freq: Every morning | SUBCUTANEOUS | Status: DC

## 2016-04-20 MED ORDER — APIXABAN 5 MG PO TABS
5.0000 mg | ORAL_TABLET | Freq: Two times a day (BID) | ORAL | Status: DC
  Administered 2016-04-21: 5 mg via ORAL
  Filled 2016-04-20: qty 1

## 2016-04-20 MED ORDER — DOCUSATE SODIUM 100 MG PO CAPS
200.0000 mg | ORAL_CAPSULE | Freq: Two times a day (BID) | ORAL | Status: DC
  Administered 2016-04-20 – 2016-04-21 (×3): 200 mg via ORAL
  Filled 2016-04-20 (×3): qty 2

## 2016-04-20 MED ORDER — DEXTROSE 50 % IV SOLN
INTRAVENOUS | Status: AC
  Administered 2016-04-20: 25 mL via INTRAVENOUS
  Filled 2016-04-20: qty 50

## 2016-04-20 MED ORDER — KCL IN DEXTROSE-NACL 20-5-0.9 MEQ/L-%-% IV SOLN
INTRAVENOUS | Status: DC
  Administered 2016-04-20 – 2016-04-21 (×2): via INTRAVENOUS
  Filled 2016-04-20 (×3): qty 1000

## 2016-04-20 MED ORDER — POTASSIUM CHLORIDE CRYS ER 20 MEQ PO TBCR
40.0000 meq | EXTENDED_RELEASE_TABLET | Freq: Once | ORAL | Status: AC
  Administered 2016-04-20: 12:00:00 40 meq via ORAL
  Filled 2016-04-20: qty 2

## 2016-04-20 NOTE — Progress Notes (Signed)
SLP Cancellation Note  Patient Details Name: Sherry [name] MRN: [id number] DOB: 1927-09-08   Cancelled treatment:       Reason Eval/Treat Not Completed: Fatigue/lethargy limiting ability to participate (reviewed chart notes; consulted NSG re: status this AM). Upon chart review of MRI and Head CT, pt has a Baseline of Multiple small chronic infarcts, Moderate, Chronic microvascular ischemic changes of the brain, and Moderate brain parenchymal volume loss. This coupled w/ pt's admitting dx of a significant UTI on UA may help explain pt's communication behavior and presentation.  NSG indicated pt was verbally responsive and answering general, basic questions and following basic commands; no swallowing deficits noted w/ few po's taken(pt has not accepted much in light of N/V and diarrhea).  ST services will be available for assessment as indicated as pt's medical status continues to improve and pt feels better to participate in assessment. NSG agreed. General aspiration precautions recommended w/ po's.   Orinda Kenner, MS, CCC-SLP Watson,Katherine 04/20/2016, 9:40 AM

## 2016-04-20 NOTE — Plan of Care (Signed)
Problem: Fluid Volume: Goal: Ability to maintain a balanced intake and output will improve Outcome: Not Progressing Nauseated intermittently. Poor po intake.   Problem: Tissue Perfusion: Goal: Complications of Ischemic Stroke will be minimized (choose ONE based on patient diagnosis) Outcome: Progressing NIH 4. Lethargic from other medical conditions. No complaints of pain. Chronic foley with current UTI.

## 2016-04-20 NOTE — Progress Notes (Signed)
[facility] [place] NAME: Sherry [name]    MR#:  [id number]  DATE OF BIRTH:  [birth year]  SUBJECTIVE:  CHIEF COMPLAINT:   Chief Complaint  Patient presents with  . Code Stroke   Weaker and drowzy. Checked BS and 33. D 50 given  No vomiting or abd pain.  REVIEW OF SYSTEMS:    Review of Systems  Constitutional: Positive for malaise/fatigue. Negative for chills and fever.  HENT: Negative for sore throat.   Eyes: Negative for blurred vision, double vision and pain.  Respiratory: Negative for cough, hemoptysis, shortness of breath and wheezing.   Cardiovascular: Negative for chest pain, palpitations, orthopnea and leg swelling.  Gastrointestinal: Negative for abdominal pain, constipation, diarrhea, heartburn, nausea and vomiting.  Genitourinary: Negative for dysuria and hematuria.  Musculoskeletal: Negative for back pain and joint pain.  Skin: Negative for rash.  Neurological: Positive for weakness. Negative for sensory change, speech change, focal weakness and headaches.  Endo/Heme/Allergies: Does not bruise/bleed easily.  Psychiatric/Behavioral: Negative for depression. The patient is not nervous/anxious.     DRUG ALLERGIES:  No Known Allergies  VITALS:  Blood pressure (!) 110/54, pulse 85, temperature 98.1 F (36.7 C), temperature source Oral, resp. rate 18, height 5\' 4"  (1.626 m), weight 66.4 kg (146 lb 6.4 oz), SpO2 97 %.  PHYSICAL EXAMINATION:   Physical Exam  GENERAL:  82 y.o.-year-old patient lying in the bed with no acute distress.  EYES: Pupils equal, round, reactive to light and accommodation. No scleral icterus. Extraocular muscles intact.  HEENT: Head atraumatic, normocephalic. Oropharynx and nasopharynx clear.  NECK:  Supple, no jugular venous distention. No thyroid enlargement, no tenderness.  LUNGS: Normal breath sounds bilaterally, no wheezing, rales, rhonchi. No use of accessory muscles of respiration.   CARDIOVASCULAR: S1, S2 normal. No murmurs, rubs, or gallops.  ABDOMEN: Soft. No tenderness. Foley catheter in place EXTREMITIES: No cyanosis, clubbing or edema b/l.    NEUROLOGIC: Cranial nerves II through XII are intact. No focal Motor or sensory deficits b/l.   PSYCHIATRIC: The patient is alert and oriented SKIN: No obvious rash, lesion, or ulcer.   LABORATORY PANEL:   CBC  Recent Labs Lab 04/20/16 0823  WBC 10.7  HGB 10.2*  HCT 31.6*  PLT 284   ------------------------------------------------------------------------------------------------------------------ Chemistries   Recent Labs Lab 04/18/16 1814  04/20/16 0823  NA 136  < > 135  K 3.8  < > 3.3*  CL 102  < > 100*  CO2 27  < > 29  GLUCOSE 88  < > 149*  BUN 14  < > 17  CREATININE 1.17*  < > 1.11*  CALCIUM 8.6*  < > 8.2*  MG  --   --  1.7  AST 24  --   --   ALT 10*  --   --   ALKPHOS 59  --   --   BILITOT 0.6  --   --   < > = values in this interval not displayed. ------------------------------------------------------------------------------------------------------------------  Cardiac Enzymes  Recent Labs Lab 04/18/16 1814  TROPONINI <0.03   ------------------------------------------------------------------------------------------------------------------  RADIOLOGY:  Ct Abdomen Pelvis W Wo Contrast  Result Date: 04/19/2016 CLINICAL DATA:  Possible renal mass on CT scan. EXAM: CT ABDOMEN AND PELVIS WITHOUT AND WITH CONTRAST TECHNIQUE: Multidetector CT imaging of the abdomen and pelvis was performed following the standard protocol before and following the bolus administration of intravenous contrast. CONTRAST:  80 cc Isovue 370 COMPARISON:  CT scan 03/09/2016 FINDINGS:  Lower chest: Small right pleural effusion. Coronary artery calcification is noted. Hepatobiliary: 13 mm subcapsular low-density lesion identified inferior right liver (image 60 series 6) with other scattered smaller inferior right subcapsular  lesions too small to characterize. There is no evidence for gallstones, gallbladder wall thickening, or pericholecystic fluid. No intrahepatic or extrahepatic biliary dilation. Pancreas: No focal mass lesion. No dilatation of the main duct. No intraparenchymal cyst. No peripancreatic edema. Spleen: No splenomegaly. No focal mass lesion. Adrenals/Urinary Tract: No adrenal nodule or mass. Precontrast imaging shows no stones in either kidney. Imaging after IV contrast administration shows areas of cortical scarring in the kidneys bilaterally. Delayed imaging was performed before contrast excretion in either kidney. As such, intrarenal collecting system and renal pelvis of each kidney are not opacified and assessment for the filling defects seen previously is not possible. There is persistent fullness of the upper pole collecting system of the right kidney, remaining suspicious for urothelial lesion. No evidence for hydroureter. Bladder decompressed by Foley catheter with gas in the bladder lumen compatible with instrumentation. Stomach/Bowel: Marked circumferential wall thickening and edema noted distal esophagus (image 12 series 11). 6 mm short axis distal paraesophageal lymph nodes evident, similar to prior. Small hiatal hernia noted. Suggestion of wall thickening gastric antrum may be related to peristalsis. Duodenal diverticulum noted. No small bowel wall thickening. No small bowel dilatation. The terminal ileum is normal. Markedly redundant sigmoid colon. Large volume stool in the rectum with rectal vault measuring 8.3 cm coronal diameter. Vascular/Lymphatic: There is abdominal aortic atherosclerosis without aneurysm. There is no gastrohepatic or hepatoduodenal ligament lymphadenopathy. No intraperitoneal or retroperitoneal lymphadenopathy. No pelvic sidewall lymphadenopathy. Reproductive: Gas is identified in the vagina. Uterus unremarkable. There is no adnexal mass. Other: Prominent perirectal edema/inflammation  is new since 03/09/2016. Musculoskeletal: Bone windows reveal no worrisome lytic or sclerotic osseous lesions. IMPRESSION: 1. Collecting system abnormality seen on previous study cannot be assessed today as the delayed imaging was performed before contrast was being excreted from the kidneys. There is persistent fullness of the upper pole calices of the right kidney, suspicious for urothelial abnormality. 2. Marked circumferential wall thickening and edema in the distal esophagus. This may be related to prominent esophagitis, but esophageal neoplasm could have this appearance. Distal paraesophageal lymph nodes are associated. 3. Interval development of dilated stool-filled rectum with perirectal edema/inflammation. In the appropriate clinical setting, these imaging features could be compatible with fecal impaction. 4. Small to moderate hiatal hernia. 5. Small right pleural effusion. 6.  Abdominal Aortic Atherosclerois (ICD10-170.0) Electronically Signed   By: Misty Stanley M.D.   On: 04/19/2016 15:41   Mr Brain Wo Contrast  Result Date: 04/19/2016 CLINICAL DATA:  83 y/o F; aphasia with interval improvement in neurologic symptoms. EXAM: MRI HEAD WITHOUT CONTRAST TECHNIQUE: Multiplanar, multiecho pulse sequences of the brain and surrounding structures were obtained without intravenous contrast. COMPARISON:  04/18/2016 CT head FINDINGS: Brain: No acute infarction, hemorrhage, hydrocephalus, extra-axial collection or mass lesion. Small chronic cortical infarcts are present within the left posterior temporal lobe and left parietal lobe. Small chronic lacunar infarcts in the right anterior and left mid corona radiata. Moderate chronic microvascular ischemic changes and parenchymal volume loss of the brain. Vascular: Normal flow voids. Skull and upper cervical spine: Normal marrow signal. Sinuses/Orbits: Mild ethmoid sinus mucosal thickening and small mucous retention cysts within the left maxillary sinus. Bilateral  intra-ocular lens replacement. No abnormal signal of mastoid air cells. Other: None. IMPRESSION: 1. No acute intracranial abnormality identified. 2. Multiple small  chronic infarcts, moderate chronic microvascular ischemic changes of the brain, and moderate brain parenchymal volume loss. Electronically Signed   By: Kristine Garbe M.D.   On: 04/19/2016 15:49   US Carotid Bilateral (at [facility] And [facility] Only)  Result Date: 04/19/2016 CLINICAL DATA:  83 year old female with a history of aphasia. Cardiovascular risk factors include diabetes EXAM: BILATERAL CAROTID DUPLEX ULTRASOUND TECHNIQUE: Pearline Cables scale imaging, color Doppler and duplex ultrasound were performed of bilateral carotid and vertebral arteries in the neck. COMPARISON:  No prior duplex FINDINGS: Criteria: Quantification of carotid stenosis is based on velocity parameters that correlate the residual internal carotid diameter with NASCET-based stenosis levels, using the diameter of the distal internal carotid lumen as the denominator for stenosis measurement. The following velocity measurements were obtained: RIGHT ICA:  Systolic 546 cm/sec, Diastolic 26 cm/sec CCA:  89 cm/sec SYSTOLIC ICA/CCA RATIO:  1.9 ECA:  117 cm/sec LEFT ICA:  Systolic 568 cm/sec, Diastolic 10 cm/sec CCA:  71 cm/sec SYSTOLIC ICA/CCA RATIO:  1.8 ECA:  131 cm/sec Right Brachial SBP: Not acquired Left Brachial SBP: Not acquired RIGHT CAROTID ARTERY: No significant calcifications of the right common carotid artery. Intermediate waveform maintained. Heterogeneous and partially calcified plaque at the right carotid bifurcation. No significant lumen shadowing. Low resistance waveform of the right ICA. No significant tortuosity. RIGHT VERTEBRAL ARTERY: Antegrade flow with low resistance waveform. LEFT CAROTID ARTERY: No significant calcifications of the left common carotid artery. Intermediate waveform maintained. Heterogeneous and partially calcified plaque at the left carotid bifurcation  without significant lumen shadowing. Low resistance waveform of the left ICA. No significant tortuosity. LEFT VERTEBRAL ARTERY:  Antegrade flow with low resistance waveform. IMPRESSION: Right: Heterogeneous and partially calcified plaque at the right carotid bifurcation, with discordant results regarding degree of stenosis by established duplex criteria. Peak velocity suggests 50%- 69% stenosis, with the ICA/ CCA ratio suggesting a lesser degree of stenosis. If establishing a more accurate degree of stenosis is required, cerebral angiogram should be considered, or as a second best test, CTA. Left: Color duplex indicates minimal heterogeneous and calcified plaque, with no hemodynamically significant stenosis by duplex criteria in the extracranial cerebrovascular circulation. Signed, Dulcy Fanny. Earleen Newport, DO [facility] Electronically Signed   By: Corrie Mckusick D.O.   On: 04/19/2016 12:33   Dg Chest Portable 1 View  Result Date: 04/18/2016 CLINICAL DATA:  Altered level of consciousness. Code stroke. Weakness. EXAM: PORTABLE CHEST 1 VIEW COMPARISON:  03/27/2016 and previous FINDINGS: Chronic cardiomegaly. Chronic aortic atherosclerosis. Less pleural fluid and less atelectasis in the lower lungs. Upper lungs are clear. No worsening or new findings. IMPRESSION: Radiographic improvement with resolving effusions and much less basilar atelectasis. Electronically Signed   By: Nelson Chimes M.D.   On: 04/18/2016 18:36   Ct Head Code Stroke W/o Cm  Result Date: 04/18/2016 CLINICAL DATA:  Code stroke. Aphasia, last seen normal at 1532 hours. History of hypertension, hyperlipidemia and diabetes. EXAM: CT HEAD WITHOUT CONTRAST TECHNIQUE: Contiguous axial images were obtained from the base of the skull through the vertex without intravenous contrast. COMPARISON:  None. FINDINGS: BRAIN: No intraparenchymal hemorrhage, mass effect nor midline shift. The ventricles and  sulci are normal for age. No acute large vascular territory infarct. Old moderate LEFT parietal lobe infarct. Patchy to confluent supratentorial white matter hypodensities. Old basal ganglia lacunar infarcts. Age indeterminate thalamus lacunar infarcts. Patchy supratentorial white matter hypodensities. VASCULAR: Moderate calcific atherosclerosis of the carotid siphons. SKULL: No skull fracture. Severe LEFT temporomandibular osteoarthrosis.  No significant scalp soft tissue swelling. SINUSES/ORBITS: The mastoid air-cells and included paranasal sinuses are well-aerated.The included ocular globes and orbital contents are non-suspicious. OTHER: Patient is edentulous. ASPECTS [facility] Stroke Program Early CT Score) - Ganglionic level infarction (caudate, lentiform nuclei, internal capsule, insula, M1-M3 cortex): 7 - Supraganglionic infarction (M4-M6 cortex): 3 Total score (0-10 with 10 being normal): 10 IMPRESSION: 1. No acute intracranial process. 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 Old basal ganglia and likely chronic bilateral thalamus infarcts. Moderate to severe chronic small vessel ischemic disease. 2. ASPECTS is 10. Critical Value/emergent results were called by telephone at the time of interpretation on 04/18/2016 at 6:24 pm to Dr. Carrie Mew , who verbally acknowledged these results. Electronically Signed   By: Elon Alas M.D.   On: 04/18/2016 18:25     ASSESSMENT AND PLAN:   * Hypoglycemia with IDDM Due to poor oral intake BS - 33. STAT D 50 given and orange juice. Patient mental status improved Start D5-NS at 50 ml/hr  * Aphasia - likely from UTI. Not CVA Symptoms resolved. MRI - nothing acute On Eliquis.  * UTI with acute encephalopathy On IV ceftriaxone. Cultures pending. CT abd showed no pyelonephritis. Renal mass seems unchanged per radiology. OP urology f/u.  * History of pulmonary embolism on Eliquis. Held due to concern regarding some dark vomiting.  All the  records are reviewed and case discussed with Care Management/Social Worker Management plans discussed with the patient, family and they are in agreement.  CODE STATUS: FULL CODE  DVT Prophylaxis: SCDs  TOTAL CC TIME TAKING CARE OF THIS PATIENT: 35 minutes.   POSSIBLE D/C IN 2-3 DAYS, DEPENDING ON CLINICAL CONDITION.  Hillary Bow R M.D on 04/20/2016 at 11:36 AM  Between 7am to 6pm - Pager - [phone]  After 6pm go to [email or web address] - password [email or web address] [facility]  Office  [phone]  CC: Primary care physician; [facility]  Note: This dictation was prepared with Dragon dictation along with smaller phrase technology. Any transcriptional errors that result from this process are unintentional.

## 2016-04-20 NOTE — Evaluation (Signed)
Physical Therapy Evaluation Patient Details Name: Sherry [name] MRN: [id number] DOB: 06-19-1927 Today's Date: 04/20/2016   History of Present Illness  83 y.o. female who presents with Unresponsiveness, initially felt in the ED to have aphasia. Code stroke process initiated and hospitals were called for admission. On workup she was found to have a significant UTI on UA. Her neurological symptoms improved greatly while in the ED  Clinical Impression  Pt is a pleasant 83 year old female who was admitted for unresponsive episode. Pt now diagnosed with UTI. Per visual assessment, urine very cloudy. Pt with incontinent episode this date, needs +2 assist for hygiene. Pt performs bed mobility with min assist and able to sit at EOB. Pt then unable to demonstrate sufficient strength for transfers attempt at bedside and reports she is having BM. Pt then assisted back supine in bed for hygiene. Pt demonstrates deficits with strength/mobility/endurance/cognition. Unsure of accurate history of PLOF as pt is poor historian. Per report, she lives with son and is ambulatory, however per chart review, is from SNF.  Would benefit from skilled PT to address above deficits and promote optimal return to PLOF; recommend transition to STR upon discharge from acute hospitalization.       Follow Up Recommendations SNF    Equipment Recommendations  None recommended by PT    Recommendations for Other Services       Precautions / Restrictions Precautions Precautions: Fall Restrictions Weight Bearing Restrictions: No      Mobility  Bed Mobility Overal bed mobility: Needs Assistance Bed Mobility: Rolling;Supine to Sit Rolling: Min guard   Supine to sit: Min assist     General bed mobility comments: needs cues for rolling for bed change; assist required for transfer to EOB. Pt with forward flexed posture. Unable to demonstrate marching or LAQ at EOB, unable to attempt standing this date secondary to severe B  LEs weakness  Transfers                 General transfer comment: unable due to safety/weakness. Pt then needs to have BM, transfer back to supine  Ambulation/Gait                Stairs            Wheelchair Mobility    Modified Rankin (Stroke Patients Only)       Balance Overall balance assessment: Needs assistance Sitting-balance support: Bilateral upper extremity supported;Feet supported Sitting balance-Leahy Scale: Fair                                       Pertinent Vitals/Pain Pain Assessment: No/denies pain    Home Living Family/patient expects to be discharged to:: Private residence Living Arrangements: Children (reports she lives with her son) Available Help at Discharge: Available 24 hours/day Type of Home: Apartment Home Access: Level entry     Home Layout: One level Home Equipment: Environmental consultant - 4 wheels Additional Comments: Pt reports living with son however, chart review indicates she lives at [facility]    Prior Function Level of Independence: Needs assistance   Gait / Transfers Assistance Needed: Pt reports she ambulates limited household distances with rollater     Comments: Pt reports her son assists with ADLs     Hand Dominance        Extremity/Trunk Assessment   Upper Extremity Assessment Upper Extremity Assessment: Defer to OT evaluation  Lower Extremity Assessment Lower Extremity Assessment: Generalized weakness (B LE grossly 3/5; however very weak)       Communication   Communication: HOH  Cognition Arousal/Alertness: Lethargic Behavior During Therapy: WFL for tasks assessed/performed Overall Cognitive Status: No family/caregiver present to determine baseline cognitive functioning                                 General Comments: pt oriented to day of the week, place, and self; reports living with son but chart review indicates she lives at [place]      Exercises Other Exercises Other Exercises: Pt had BM in diaper, needs cga for rolling to sides and max assist for hygiene. +2 assist required. Pt very sore on buttock. RN aware.   Assessment/Plan    PT Assessment Patient needs continued PT services  PT Problem List Decreased strength;Decreased activity tolerance;Decreased balance;Decreased mobility       PT Treatment Interventions Gait training;Therapeutic exercise    PT Goals (Current goals can be found in the Care Plan section)  Acute Rehab PT Goals Patient Stated Goal: feel better PT Goal Formulation: Patient unable to participate in goal setting Time For Goal Achievement: 05/04/16 Potential to Achieve Goals: Fair    Frequency Min 2X/week   Barriers to discharge        Co-evaluation               End of Session   Activity Tolerance: Patient limited by fatigue Patient left: in bed;with bed alarm set Nurse Communication: Mobility status PT Visit Diagnosis: Muscle weakness (generalized) (M62.81);Difficulty in walking, not elsewhere classified (R26.2)    Time: 4008-6761 PT Time Calculation (min) (ACUTE ONLY): 13 min   Charges:   PT Evaluation $PT Eval Low Complexity: 1 Procedure PT Treatments $Therapeutic Activity: 8-22 mins   PT G Codes:        Sherry [name], PT, DPT [phone]   Sherry [name] 04/20/2016, 11:53 AM

## 2016-04-20 NOTE — NC FL2 (Signed)
[place] LEVEL OF CARE SCREENING TOOL     IDENTIFICATION  Patient Name: Sherry [name] Birthdate: 11-24-1927 Sex: female Admission Date (Current Location): 04/18/2016  [place] and [place] Number:  Engineering geologist and Address:  [facility], [address] [place], [place] [address]      Provider Number: [id number]  Attending Physician Name and Address:  Hillary Bow, MD  Relative Name and Phone Number:       Current Level of Care: Hospital Recommended Level of Care: [place] Prior Approval Number:    Date Approved/Denied:   PASRR Number:  [id number])  Discharge Plan: SNF    Current Diagnoses: Patient Active Problem List   Diagnosis Date Noted  . Aphasia 04/18/2016  . Nausea & vomiting 03/27/2016  . Advance care planning   . UTI (urinary tract infection) 03/12/2016  . Chest pain 03/09/2016  . PE (pulmonary thromboembolism) ([place]) 02/26/2016  . Diabetes mellitus, type 2 ([place]) 02/26/2016  . Anemia 02/26/2016  . Chronic kidney disease (CKD), stage III (moderate) 02/26/2016  . Essential hypertension 02/26/2016  . Goals of care, counseling/discussion   . Adult failure to thrive syndrome   . Palliative care by specialist   . Sepsis ([place]) 11/26/2014    Orientation RESPIRATION BLADDER Height & Weight     Self, Place  Normal Indwelling catheter, Incontinent (Chronic catheter use. ) Weight: 146 lb 6.4 oz (66.4 kg) Height:  5\' 4"  (162.6 cm)  BEHAVIORAL SYMPTOMS/MOOD NEUROLOGICAL BOWEL NUTRITION STATUS   (None.)  (None. ) Incontinent Diet (Diet: Heart)  AMBULATORY STATUS COMMUNICATION OF NEEDS Skin   Extensive Assist Verbally Normal                       Personal Care Assistance Level of Assistance  Bathing, Feeding, Dressing Bathing Assistance: Limited assistance Feeding assistance: Independent Dressing Assistance: Limited assistance     Functional Limitations Info  Sight, Hearing, Speech Sight  Info: Adequate Hearing Info: Adequate Speech Info: Impaired (Upper and lower dentures)    SPECIAL CARE FACTORS FREQUENCY  PT (By licensed PT), OT (By licensed OT)     PT Frequency:  (5) OT Frequency:  (5)            Contractures      Additional Factors Info  Code Status, Allergies Code Status Info:  (Full Code) Allergies Info:  (No Known Allergies)           Current Medications (04/20/2016):  This is the current hospital active medication list Current Facility-Administered Medications  Medication Dose Route Frequency Provider Last Rate Last Dose  . acetaminophen (TYLENOL) tablet 650 mg  650 mg Oral Q6H PRN Lance Coon, MD       Or  . acetaminophen (TYLENOL) suppository 650 mg  650 mg Rectal Q6H PRN Lance Coon, MD      . amLODipine (NORVASC) tablet 5 mg  5 mg Oral Daily Hillary Bow, MD   5 mg at 04/20/16 4765  . [START ON 04/21/2016] apixaban (ELIQUIS) tablet 5 mg  5 mg Oral BID Srikar Sudini, MD      . atorvastatin (LIPITOR) tablet 20 mg  20 mg Oral QHS Lance Coon, MD      . cefTRIAXone (ROCEPHIN) 1 g in dextrose 5 % 50 mL IVPB  1 g Intravenous Q24H Bettey Costa, MD   1 g at 04/19/16 1731  . dextrose 5 % and 0.9 % NaCl with KCl 20 mEq/L infusion  Intravenous Continuous Hillary Bow, MD 50 mL/hr at 04/20/16 0932    . docusate sodium (COLACE) capsule 200 mg  200 mg Oral BID Hillary Bow, MD   200 mg at 04/20/16 2297  . gabapentin (NEURONTIN) capsule 300 mg  300 mg Oral QHS Srikar Sudini, MD      . insulin aspart (novoLOG) injection 0-9 Units  0-9 Units Subcutaneous TID WC Hillary Bow, MD   2 Units at 04/19/16 1730  . ondansetron (ZOFRAN) tablet 4 mg  4 mg Oral Q4H PRN Harrie Foreman, MD       Or  . ondansetron [facility]) injection 4 mg  4 mg Intravenous Q4H PRN Harrie Foreman, MD   4 mg at 04/19/16 1611  . pantoprazole (PROTONIX) injection 40 mg  40 mg Intravenous Q12H Harrie Foreman, MD   40 mg at 04/20/16 9892  . potassium chloride SA (K-DUR,KLOR-CON) CR  tablet 40 mEq  40 mEq Oral Once [employer], MD      . sodium chloride flush (NS) 0.9 % injection 3 mL  3 mL Intravenous Q12H Lance Coon, MD   3 mL at 04/20/16 1194     Discharge Medications: Please see discharge summary for a list of discharge medications.  Relevant Imaging Results:  Relevant Lab Results:   Additional Information  (SSN: [id number])  Danie Chandler, Student-Social Work

## 2016-04-21 LAB — URINE CULTURE: Culture: >=100000 — AB

## 2016-04-21 LAB — GLUCOSE, CAPILLARY
GLUCOSE-CAPILLARY: 215 mg/dL — AB (ref 65–99)
GLUCOSE-CAPILLARY: 255 mg/dL — AB (ref 65–99)
GLUCOSE-CAPILLARY: 121 mg/dL — AB (ref 65–99)

## 2016-04-21 MED ORDER — CEPHALEXIN 500 MG PO CAPS: 500.0000 mg | ORAL_CAPSULE | Freq: Three times a day (TID) | ORAL | 0 refills | Status: DC

## 2016-04-21 NOTE — Discharge Summary (Signed)
[facility] [facility] [place] NAME: Sherry [name]    MR#:  [id number]  DATE OF BIRTH:  1927-11-02  DATE OF ADMISSION:  04/18/2016 ADMITTING PHYSICIAN: Lance Coon, MD  DATE OF DISCHARGE: 04/21/2016  PRIMARY CARE PHYSICIAN: [place]   ADMISSION DIAGNOSIS:  Aphasia [R47.01] Altered mental status, unspecified altered mental status type [R41.82]  DISCHARGE DIAGNOSIS:  Principal Problem:   Aphasia Active Problems:   Diabetes mellitus, type 2 ([place])   Chronic kidney disease (CKD), stage III (moderate)   Essential hypertension   UTI (urinary tract infection)   SECONDARY DIAGNOSIS:   Past Medical History:  Diagnosis Date  . Diabetes mellitus without complication ([place])   . Hyperlipidemia   . Hypertension      ADMITTING HISTORY  HISTORY OF PRESENT ILLNESS:  Sherry [name]  is a 83 y.o. female who presents with Unresponsiveness, initially felt in the ED to have a aphasia. Code stroke process initiated and hospitals were called for admission. On workup he was found to have a significant UTI on UA. Her neurological symptoms improved greatly while in the ED.   HOSPITAL COURSE:   * Proteus UTI, catheter related Treated with IV ceftriaxone in the hospital. Blood cultures negative. Afebrile and normal WBC. Sensitive to Keflex and patient will be discharged on oral Keflex for 5 more days. Her Foley catheter was changed in the hospital. Continue Foley catheter due to urinary retention.  * Hypoglycemia with Insulin dependent diabetes Patient was continued on her Lantus but due to inadequate oral intake had hypoglycemia at 33. This improved well with D50 and D5 normal saline. Today patient's appetite is improved and she will be resumed on her home regimen.  * Constipation Improved with laxatives. Had some nausea and weight abdominal pain which has resolved. CT scan of the chest has shown some esophagitis and constipation.  * Esophagitis.  Start PPI.  * Acute encephalopathy due to UTI Resolved  * HTN No change in medications  * h/o PE On Eliquis  * Right renal mass Unchanged on repeat CT scan. She does have follow-up with urology as outpatient.  Discussed plan of care with patient and daughter on day of discharge.  Stable for discharge back to skilled nursing facility  CONSULTS OBTAINED:  Treatment Team:  Leotis Pain, MD Catarina Hartshorn, MD  DRUG ALLERGIES:  No Known Allergies  DISCHARGE MEDICATIONS:   Current Discharge Medication List    START taking these medications   Details  cephALEXin (KEFLEX) 500 MG capsule Take 1 capsule (500 mg total) by mouth 3 (three) times daily. Qty: 15 capsule, Refills: 0      CONTINUE these medications which have NOT CHANGED   Details  acetaminophen (TYLENOL) 325 MG tablet Take 650 mg by mouth every 4 (four) hours as needed for mild pain or moderate pain.    amLODipine (NORVASC) 5 MG tablet Take 5 mg by mouth daily.    apixaban (ELIQUIS) 5 MG TABS tablet Take 1 tablet (5 mg total) by mouth 2 (two) times daily. Qty: 60 tablet, Refills: 3    atorvastatin (LIPITOR) 20 MG tablet Take 1 tablet by mouth at bedtime.    gabapentin (NEURONTIN) 300 MG capsule Take 1 capsule by mouth at bedtime.    guaiFENesin (MUCINEX) 600 MG 12 hr tablet Take by mouth 2 (two) times daily.    Insulin Detemir (LEVEMIR) 100 UNIT/ML Pen Inject 15 Units into the skin every morning.     insulin lispro (  HUMALOG) 100 UNIT/ML injection Inject into the skin 3 (three) times daily before meals. Sliding scale insulin. 0-99=0 units 100-149=2 units 150-199=3 units 200-249=4 units 250-299=5 units 300-349=6 units 350-399=7 units 400-449=8 units 450-499=9 units    lisinopril-hydrochlorothiazide (PRINZIDE,ZESTORETIC) 20-12.5 MG tablet Take 2 tablets by mouth daily.     ondansetron (ZOFRAN) 4 MG tablet Take 4 mg by mouth 2 (two) times daily. *May take every 8 hours as needed for nausea and  vomiting    pantoprazole (PROTONIX) 40 MG tablet Take 1 tablet (40 mg total) by mouth daily. Qty: 30 tablet, Refills: 0    tamsulosin (FLOMAX) 0.4 MG CAPS capsule Take 1 capsule (0.4 mg total) by mouth daily. Qty: 30 capsule, Refills: 0        Today   VITAL SIGNS:  Blood pressure (!) 143/47, pulse 80, temperature 99 F (37.2 C), temperature source Oral, resp. rate 16, height 5\' 4"  (1.626 m), weight 66.4 kg (146 lb 6.4 oz), SpO2 96 %.  I/O:   Intake/Output Summary (Last 24 hours) at 04/21/16 1055 Last data filed at 04/21/16 0959  Gross per 24 hour  Intake          1750.83 ml  Output              100 ml  Net          1650.83 ml    PHYSICAL EXAMINATION:  Physical Exam  GENERAL:  83 y.o.-year-old patient lying in the bed with no acute distress.  LUNGS: Normal breath sounds bilaterally, no wheezing, rales,rhonchi or crepitation. No use of accessory muscles of respiration.  CARDIOVASCULAR: S1, S2 normal. No murmurs, rubs, or gallops.  ABDOMEN: Soft, non-tender, non-distended. Bowel sounds present. No organomegaly or mass.  NEUROLOGIC: Moves all 4 extremities. PSYCHIATRIC: The patient is alert and oriented x 3.  SKIN: No obvious rash, lesion, or ulcer.   DATA REVIEW:   CBC  Recent Labs Lab 04/20/16 0823  WBC 10.7  HGB 10.2*  HCT 31.6*  PLT 284    Chemistries   Recent Labs Lab 04/18/16 1814  04/20/16 0823  NA 136  < > 135  K 3.8  < > 3.3*  CL 102  < > 100*  CO2 27  < > 29  GLUCOSE 88  < > 149*  BUN 14  < > 17  CREATININE 1.17*  < > 1.11*  CALCIUM 8.6*  < > 8.2*  MG  --   --  1.7  AST 24  --   --   ALT 10*  --   --   ALKPHOS 59  --   --   BILITOT 0.6  --   --   < > = values in this interval not displayed.  Cardiac Enzymes  Recent Labs Lab 04/18/16 1814  TROPONINI <0.03    Microbiology Results  Results for orders placed or performed during the hospital encounter of 04/18/16  Urine culture     Status: Abnormal   Collection Time: 04/18/16  8:05  PM  Result Value Ref Range Status   Specimen Description URINE, RANDOM  Final   Special Requests NONE  Final   Culture >=100,000 COLONIES/mL PROTEUS MIRABILIS (A)  Final   Report Status 04/21/2016 FINAL  Final   Organism ID, Bacteria PROTEUS MIRABILIS (A)  Final      Susceptibility   Proteus mirabilis - MIC*    AMPICILLIN <=2 SENSITIVE Sensitive     CEFAZOLIN <=4 SENSITIVE Sensitive     CEFTRIAXONE <=1  SENSITIVE Sensitive     CIPROFLOXACIN >=4 RESISTANT Resistant     GENTAMICIN 8 INTERMEDIATE Intermediate     IMIPENEM 4 SENSITIVE Sensitive     NITROFURANTOIN RESISTANT Resistant     TRIMETH/SULFA >=320 RESISTANT Resistant     AMPICILLIN/SULBACTAM <=2 SENSITIVE Sensitive     PIP/TAZO <=4 SENSITIVE Sensitive     * >=100,000 COLONIES/mL PROTEUS MIRABILIS  CULTURE, BLOOD (ROUTINE X 2) w Reflex to ID Panel     Status: None (Preliminary result)   Collection Time: 04/20/16 11:39 AM  Result Value Ref Range Status   Specimen Description BLOOD R HAND  Final   Special Requests   Final    BOTTLES DRAWN AEROBIC AND ANAEROBIC Blood Culture adequate volume   Culture NO GROWTH < 24 HOURS  Final   Report Status PENDING  Incomplete  CULTURE, BLOOD (ROUTINE X 2) w Reflex to ID Panel     Status: None (Preliminary result)   Collection Time: 04/20/16 11:39 AM  Result Value Ref Range Status   Specimen Description BLOOD L AC  Final   Special Requests   Final    BOTTLES DRAWN AEROBIC AND ANAEROBIC Blood Culture adequate volume   Culture NO GROWTH < 24 HOURS  Final   Report Status PENDING  Incomplete    RADIOLOGY:  Ct Abdomen Pelvis W Wo Contrast  Result Date: 04/19/2016 CLINICAL DATA:  Possible renal mass on CT scan. EXAM: CT ABDOMEN AND PELVIS WITHOUT AND WITH CONTRAST TECHNIQUE: Multidetector CT imaging of the abdomen and pelvis was performed following the standard protocol before and following the bolus administration of intravenous contrast. CONTRAST:  80 cc Isovue 370 COMPARISON:  CT scan  03/09/2016 FINDINGS: Lower chest: Small right pleural effusion. Coronary artery calcification is noted. Hepatobiliary: 13 mm subcapsular low-density lesion identified inferior right liver (image 60 series 6) with other scattered smaller inferior right subcapsular lesions too small to characterize. There is no evidence for gallstones, gallbladder wall thickening, or pericholecystic fluid. No intrahepatic or extrahepatic biliary dilation. Pancreas: No focal mass lesion. No dilatation of the main duct. No intraparenchymal cyst. No peripancreatic edema. Spleen: No splenomegaly. No focal mass lesion. Adrenals/Urinary Tract: No adrenal nodule or mass. Precontrast imaging shows no stones in either kidney. Imaging after IV contrast administration shows areas of cortical scarring in the kidneys bilaterally. Delayed imaging was performed before contrast excretion in either kidney. As such, intrarenal collecting system and renal pelvis of each kidney are not opacified and assessment for the filling defects seen previously is not possible. There is persistent fullness of the upper pole collecting system of the right kidney, remaining suspicious for urothelial lesion. No evidence for hydroureter. Bladder decompressed by Foley catheter with gas in the bladder lumen compatible with instrumentation. Stomach/Bowel: Marked circumferential wall thickening and edema noted distal esophagus (image 12 series 11). 6 mm short axis distal paraesophageal lymph nodes evident, similar to prior. Small hiatal hernia noted. Suggestion of wall thickening gastric antrum may be related to peristalsis. Duodenal diverticulum noted. No small bowel wall thickening. No small bowel dilatation. The terminal ileum is normal. Markedly redundant sigmoid colon. Large volume stool in the rectum with rectal vault measuring 8.3 cm coronal diameter. Vascular/Lymphatic: There is abdominal aortic atherosclerosis without aneurysm. There is no gastrohepatic or  hepatoduodenal ligament lymphadenopathy. No intraperitoneal or retroperitoneal lymphadenopathy. No pelvic sidewall lymphadenopathy. Reproductive: Gas is identified in the vagina. Uterus unremarkable. There is no adnexal mass. Other: Prominent perirectal edema/inflammation is new since 03/09/2016. Musculoskeletal: Bone windows reveal no worrisome  lytic or sclerotic osseous lesions. IMPRESSION: 1. Collecting system abnormality seen on previous study cannot be assessed today as the delayed imaging was performed before contrast was being excreted from the kidneys. There is persistent fullness of the upper pole calices of the right kidney, suspicious for urothelial abnormality. 2. Marked circumferential wall thickening and edema in the distal esophagus. This may be related to prominent esophagitis, but esophageal neoplasm could have this appearance. Distal paraesophageal lymph nodes are associated. 3. Interval development of dilated stool-filled rectum with perirectal edema/inflammation. In the appropriate clinical setting, these imaging features could be compatible with fecal impaction. 4. Small to moderate hiatal hernia. 5. Small right pleural effusion. 6.  Abdominal Aortic Atherosclerois (ICD10-170.0) Electronically Signed   By: Misty Stanley M.D.   On: 04/19/2016 15:41   Mr Brain Wo Contrast  Result Date: 04/19/2016 CLINICAL DATA:  84 y/o F; aphasia with interval improvement in neurologic symptoms. EXAM: MRI HEAD WITHOUT CONTRAST TECHNIQUE: Multiplanar, multiecho pulse sequences of the brain and surrounding structures were obtained without intravenous contrast. COMPARISON:  04/18/2016 CT head FINDINGS: Brain: No acute infarction, hemorrhage, hydrocephalus, extra-axial collection or mass lesion. Small chronic cortical infarcts are present within the left posterior temporal lobe and left parietal lobe. Small chronic lacunar infarcts in the right anterior and left mid corona radiata. Moderate chronic microvascular  ischemic changes and parenchymal volume loss of the brain. Vascular: Normal flow voids. Skull and upper cervical spine: Normal marrow signal. Sinuses/Orbits: Mild ethmoid sinus mucosal thickening and small mucous retention cysts within the left maxillary sinus. Bilateral intra-ocular lens replacement. No abnormal signal of mastoid air cells. Other: None. IMPRESSION: 1. No acute intracranial abnormality identified. 2. Multiple small chronic infarcts, moderate chronic microvascular ischemic changes of the brain, and moderate brain parenchymal volume loss. Electronically Signed   By: Kristine Garbe M.D.   On: 04/19/2016 15:49   US Carotid Bilateral (at [facility] Only)  Result Date: 04/19/2016 CLINICAL DATA:  83 year old female with a history of aphasia. Cardiovascular risk factors include diabetes EXAM: BILATERAL CAROTID DUPLEX ULTRASOUND TECHNIQUE: Pearline Cables scale imaging, color Doppler and duplex ultrasound were performed of bilateral carotid and vertebral arteries in the neck. COMPARISON:  No prior duplex FINDINGS: Criteria: Quantification of carotid stenosis is based on velocity parameters that correlate the residual internal carotid diameter with NASCET-based stenosis levels, using the diameter of the distal internal carotid lumen as the denominator for stenosis measurement. The following velocity measurements were obtained: RIGHT ICA:  Systolic 947 cm/sec, Diastolic 26 cm/sec CCA:  89 cm/sec SYSTOLIC ICA/CCA RATIO:  1.9 ECA:  117 cm/sec LEFT ICA:  Systolic 096 cm/sec, Diastolic 10 cm/sec CCA:  71 cm/sec SYSTOLIC ICA/CCA RATIO:  1.8 ECA:  131 cm/sec Right Brachial SBP: Not acquired Left Brachial SBP: Not acquired RIGHT CAROTID ARTERY: No significant calcifications of the right common carotid artery. Intermediate waveform maintained. Heterogeneous and partially calcified plaque at the right carotid bifurcation. No significant lumen shadowing. Low resistance waveform of the right ICA. No significant  tortuosity. RIGHT VERTEBRAL ARTERY: Antegrade flow with low resistance waveform. LEFT CAROTID ARTERY: No significant calcifications of the left common carotid artery. Intermediate waveform maintained. Heterogeneous and partially calcified plaque at the left carotid bifurcation without significant lumen shadowing. Low resistance waveform of the left ICA. No significant tortuosity. LEFT VERTEBRAL ARTERY:  Antegrade flow with low resistance waveform. IMPRESSION: Right: Heterogeneous and partially calcified plaque at the right carotid bifurcation, with discordant results regarding degree of stenosis by established duplex criteria. Peak velocity suggests 50%-  69% stenosis, with the ICA/ CCA ratio suggesting a lesser degree of stenosis. If establishing a more accurate degree of stenosis is required, cerebral angiogram should be considered, or as a second best test, CTA. Left: Color duplex indicates minimal heterogeneous and calcified plaque, with no hemodynamically significant stenosis by duplex criteria in the extracranial cerebrovascular circulation. Signed, Dulcy Fanny. Earleen Newport, DO [facility] Electronically Signed   By: Corrie Mckusick D.O.   On: 04/19/2016 12:33    Follow up with PCP in 1 week.  Management plans discussed with the patient, family and they are in agreement.  CODE STATUS:     Code Status Orders        Start     Ordered   04/19/16 0050  Full code  Continuous     04/19/16 0050    Code Status History    Date Active Date Inactive Code Status Order ID Comments User Context   03/27/2016  9:39 PM 03/29/2016  9:41 PM Full Code [id number]  Fritzi Mandes, MD ED   03/12/2016 12:21 PM 03/13/2016  8:32 PM Full Code [id number]  Dustin Flock, MD Inpatient   03/09/2016  4:33 PM 03/12/2016 12:21 PM DNR [id number]  Nicholes Mango, MD ED   02/26/2016  9:39 PM 03/01/2016 10:37 PM DNR [id number]  Karmen Bongo, MD Inpatient   02/26/2016  7:48 PM 02/26/2016  9:39 PM DNR  [id number]  Dorie Rank, MD ED   01/31/2016  5:45 PM 02/07/2016 11:45 PM DNR [id number]  Flora Lipps, MD Inpatient   01/30/2016 11:19 PM 01/31/2016  5:44 PM Full Code [id number]  Harvie Bridge, DO Inpatient   11/26/2014  3:52 PM 11/28/2014  3:07 PM Full Code [id number]  Max Sane, MD Inpatient   11/26/2014  8:59 AM 11/26/2014  3:52 PM Full Code [id number]  Max Sane, MD ED    Advance Directive Documentation     Most Recent Value  Type of Advance Directive  Living will  Pre-existing out of facility DNR order (yellow form or pink MOST form)  -  "MOST" Form in Place?  -      TOTAL TIME TAKING CARE OF THIS PATIENT ON DAY OF DISCHARGE: more than 30 minutes.   Hillary Bow R M.D on 04/21/2016 at 10:55 AM  Between 7am to 6pm - Pager - [phone]  After 6pm go to [email or web address] - password [email or web address] [place] [facility]  Office  [phone]  CC: Primary care physician; [facility]  Note: This dictation was prepared with Dragon dictation along with smaller phrase technology. Any transcriptional errors that result from this process are unintentional.

## 2016-04-21 NOTE — Care Management Important Message (Signed)
Important Message  Patient Details  Name: Sherry [name] MRN: [id number] Date of Birth: [birth year]   Medicare Important Message Given:  Yes    Shelbie Ammons, RN 04/21/2016, 8:18 AM

## 2016-04-21 NOTE — Progress Notes (Signed)
Pt is being discharged to [facility]. AVS given and explained to pt. Educational handouts about CAUTI given and explained to pt. Pt verbalized understanding. A copy of AVS placed in discharge packet. Report given to Sherren Mocha, Therapist, sports. Awaiting EMS.

## 2016-04-21 NOTE — Plan of Care (Signed)
Problem: Fluid Volume: Goal: Ability to maintain a balanced intake and output will improve Outcome: Not Progressing Pt still with poor appetite and nausea when eating

## 2016-04-21 NOTE — Progress Notes (Signed)
Pt left via EMS at 2059. At discharge pt shows no signs of distress.

## 2016-04-21 NOTE — Clinical Social Work Note (Signed)
Clinical Social Work Assessment  Patient Details  Name: Sherry [name] MRN: [id number] Date of Birth: 14-Mar-1927  Date of referral:  04/21/16               Reason for consult:  Facility Placement, Discharge Planning                Permission sought to share information with:  Family Supports Permission granted to share information::  Yes, Verbal Permission Granted  Name::     [name]  Relationship::  daughter  Contact Information:  [phone]  Housing/Transportation Living arrangements for the past 2 months:   Facility Source of Information:  Patient Patient Interpreter Needed:  None Criminal Activity/Legal Involvement Pertinent to Current Situation/Hospitalization:  No - Comment as needed Significant Relationships:  Adult Children Lives with:  Facility Resident Do you feel safe going back to the place where you live?  Yes Need for family participation in patient care:  No (Coment)  Care giving concerns:  No care giving concerns identified.   Social Worker assessment / plan:  CSW met with pt to address consult as pt was was admitted from [facility]. CSW introduced herself and explained role of social work. Pt is able to return to facility. Facility is ready to admit pt as they have received discharge information. CSW left a message for pt's daughter, requesting a return phone call. RN will call report. [facility] will provide transportation. CSW is signing off as no further needs identified.  Employment status:  Retired Nurse, adult PT Recommendations:  [place] / Referral to community resources:  [place]  Patient/Family's Response to care:  Pt was appreciative of CSW support.   Patient/Family's Understanding of and Emotional Response to Diagnosis, Current Treatment, and Prognosis:  Pt understands that she needs continued care at [facility].  Emotional Assessment Appearance:  Appears stated  age Attitude/Demeanor/Rapport:   (appropriate) Affect (typically observed):  Accepting, Adaptable, Pleasant Orientation:  Oriented to Situation, Oriented to Self, Oriented to Place, Oriented to  Time Alcohol / Substance use:  Not Applicable Psych involvement (Current and /or in the community):  No (Comment)  Discharge Needs  Concerns to be addressed:  Adjustment to Illness Readmission within the last 30 days:  No Current discharge risk:  Chronically ill Barriers to Discharge:  No Barriers Identified   Darden Dates, LCSW 04/21/2016, 12:37 PM

## 2016-04-24 ENCOUNTER — Inpatient Hospital Stay
Admission: EM | Admit: 2016-04-24 | Discharge: 2016-04-27 | DRG: 392 | Disposition: A | Discharge status: Skilled Nursing Facility | Payer: Medicare Other | Attending: Internal Medicine | Admitting: Internal Medicine

## 2016-04-24 ENCOUNTER — Encounter: Payer: Self-pay | Admitting: Emergency Medicine

## 2016-04-24 DIAGNOSIS — E11649 Type 2 diabetes mellitus with hypoglycemia without coma: Secondary | ICD-10-CM | POA: Diagnosis not present

## 2016-04-24 DIAGNOSIS — Z87440 Personal history of urinary (tract) infections: Secondary | ICD-10-CM

## 2016-04-24 DIAGNOSIS — N189 Chronic kidney disease, unspecified: Secondary | ICD-10-CM | POA: Diagnosis present

## 2016-04-24 DIAGNOSIS — E1122 Type 2 diabetes mellitus with diabetic chronic kidney disease: Secondary | ICD-10-CM | POA: Diagnosis present

## 2016-04-24 DIAGNOSIS — Z86711 Personal history of pulmonary embolism: Secondary | ICD-10-CM

## 2016-04-24 DIAGNOSIS — Z7901 Long term (current) use of anticoagulants: Secondary | ICD-10-CM

## 2016-04-24 DIAGNOSIS — E876 Hypokalemia: Secondary | ICD-10-CM | POA: Diagnosis not present

## 2016-04-24 DIAGNOSIS — Z515 Encounter for palliative care: Secondary | ICD-10-CM | POA: Diagnosis present

## 2016-04-24 DIAGNOSIS — D638 Anemia in other chronic diseases classified elsewhere: Secondary | ICD-10-CM | POA: Diagnosis present

## 2016-04-24 DIAGNOSIS — Z66 Do not resuscitate: Secondary | ICD-10-CM | POA: Diagnosis present

## 2016-04-24 DIAGNOSIS — K529 Noninfective gastroenteritis and colitis, unspecified: Principal | ICD-10-CM | POA: Diagnosis present

## 2016-04-24 DIAGNOSIS — R112 Nausea with vomiting, unspecified: Secondary | ICD-10-CM | POA: Diagnosis not present

## 2016-04-24 DIAGNOSIS — I4892 Unspecified atrial flutter: Secondary | ICD-10-CM | POA: Diagnosis present

## 2016-04-24 DIAGNOSIS — Z794 Long term (current) use of insulin: Secondary | ICD-10-CM

## 2016-04-24 DIAGNOSIS — R197 Diarrhea, unspecified: Secondary | ICD-10-CM

## 2016-04-24 DIAGNOSIS — I129 Hypertensive chronic kidney disease with stage 1 through stage 4 chronic kidney disease, or unspecified chronic kidney disease: Secondary | ICD-10-CM | POA: Diagnosis present

## 2016-04-24 DIAGNOSIS — Z79899 Other long term (current) drug therapy: Secondary | ICD-10-CM

## 2016-04-24 DIAGNOSIS — E785 Hyperlipidemia, unspecified: Secondary | ICD-10-CM | POA: Diagnosis present

## 2016-04-24 HISTORY — DX: Other pulmonary embolism without acute cor pulmonale: I26.99

## 2016-04-24 LAB — COMPREHENSIVE METABOLIC PANEL
ALBUMIN: 2.7 g/dL — AB (ref 3.5–5.0)
ALT: 9 U/L — ABNORMAL LOW (ref 14–54)
AST: 17 U/L (ref 15–41)
Alkaline Phosphatase: 68 U/L (ref 38–126)
Anion gap: 11 (ref 5–15)
BUN: 11 mg/dL (ref 6–20)
CHLORIDE: 98 mmol/L — AB (ref 101–111)
CO2: 28 mmol/L (ref 22–32)
Calcium: 8.5 mg/dL — ABNORMAL LOW (ref 8.9–10.3)
Creatinine, Ser: 0.89 mg/dL (ref 0.44–1.00)
GFR calc Af Amer: >60 mL/min (ref >60)
GFR, EST NON AFRICAN AMERICAN: 57 mL/min — AB (ref >60)
Glucose, Bld: 186 mg/dL — ABNORMAL HIGH (ref 65–99)
POTASSIUM: 3.5 mmol/L (ref 3.5–5.1)
Sodium: 137 mmol/L (ref 135–145)
Total Bilirubin: 0.5 mg/dL (ref 0.3–1.2)
Total Protein: 6.2 g/dL — ABNORMAL LOW (ref 6.5–8.1)

## 2016-04-24 LAB — CBC WITH DIFFERENTIAL/PLATELET
BASOS ABS: 0 10*3/uL (ref 0–0.1)
BASOS PCT: 0 %
EOS PCT: 0 %
Eosinophils Absolute: 0 10*3/uL (ref 0–0.7)
HCT: 36.1 % (ref 35.0–47.0)
Hemoglobin: 11.8 g/dL — ABNORMAL LOW (ref 12.0–16.0)
Lymphocytes Relative: 14 %
Lymphs Abs: 1.4 10*3/uL (ref 1.0–3.6)
MCH: 26.8 pg (ref 26.0–34.0)
MCHC: 32.7 g/dL (ref 32.0–36.0)
MCV: 81.9 fL (ref 80.0–100.0)
MONO ABS: 0.4 10*3/uL (ref 0.2–0.9)
Monocytes Relative: 4 %
Neutro Abs: 8 10*3/uL — ABNORMAL HIGH (ref 1.4–6.5)
Neutrophils Relative %: 82 %
PLATELETS: 370 10*3/uL (ref 150–440)
RBC: 4.41 MIL/uL (ref 3.80–5.20)
RDW: 16.6 % — AB (ref 11.5–14.5)
WBC: 9.8 10*3/uL (ref 3.6–11.0)

## 2016-04-24 LAB — GLUCOSE, CAPILLARY: GLUCOSE-CAPILLARY: 99 mg/dL (ref 65–99)

## 2016-04-24 LAB — URINALYSIS, COMPLETE (UACMP) WITH MICROSCOPIC
Bacteria, UA: NONE SEEN
Bilirubin Urine: NEGATIVE
GLUCOSE, UA: 50 mg/dL — AB
HGB URINE DIPSTICK: NEGATIVE
Ketones, ur: 5 mg/dL — AB
NITRITE: NEGATIVE
PH: 7 (ref 5.0–8.0)
PROTEIN: 100 mg/dL — AB
SPECIFIC GRAVITY, URINE: 1.015 (ref 1.005–1.030)
SQUAMOUS EPITHELIAL / LPF: NONE SEEN

## 2016-04-24 LAB — LIPASE, BLOOD: LIPASE: 12 U/L (ref 11–51)

## 2016-04-24 LAB — MAGNESIUM: Magnesium: 1.6 mg/dL — ABNORMAL LOW (ref 1.7–2.4)

## 2016-04-24 MED ORDER — PANTOPRAZOLE SODIUM 40 MG PO TBEC: 40.0000 mg | DELAYED_RELEASE_TABLET | Freq: Every day | ORAL | Status: DC

## 2016-04-24 MED ORDER — SODIUM CHLORIDE 0.9 % IV BOLUS (SEPSIS)
1000.0000 mL | Freq: Once | INTRAVENOUS | Status: AC
Start: 2016-04-24 — End: 2016-04-24
  Administered 2016-04-24: 1000 mL via INTRAVENOUS

## 2016-04-24 MED ORDER — APIXABAN 5 MG PO TABS
5.0000 mg | ORAL_TABLET | Freq: Two times a day (BID) | ORAL | Status: DC
  Administered 2016-04-24 – 2016-04-27 (×6): 5 mg via ORAL
  Filled 2016-04-24 (×6): qty 1

## 2016-04-24 MED ORDER — SODIUM CHLORIDE 0.9 % IV SOLN
30.0000 meq | Freq: Once | INTRAVENOUS | Status: AC
  Administered 2016-04-24: 23:00:00 30 meq via INTRAVENOUS
  Filled 2016-04-24: qty 15

## 2016-04-24 MED ORDER — ACETAMINOPHEN 650 MG RE SUPP: 650.0000 mg | Freq: Four times a day (QID) | RECTAL | Status: DC | PRN

## 2016-04-24 MED ORDER — TAMSULOSIN HCL 0.4 MG PO CAPS
0.4000 mg | ORAL_CAPSULE | Freq: Every day | ORAL | Status: DC
  Administered 2016-04-25 – 2016-04-27 (×3): 0.4 mg via ORAL
  Filled 2016-04-24 (×3): qty 1

## 2016-04-24 MED ORDER — RISAQUAD PO CAPS
1.0000 | ORAL_CAPSULE | Freq: Every day | ORAL | Status: DC
  Administered 2016-04-25 – 2016-04-27 (×3): 1 via ORAL
  Filled 2016-04-24 (×3): qty 1

## 2016-04-24 MED ORDER — DEXTROSE 5 % IV SOLN
1.0000 g | INTRAVENOUS | Status: DC
  Administered 2016-04-24 – 2016-04-26 (×3): 1 g via INTRAVENOUS
  Filled 2016-04-24 (×4): qty 10

## 2016-04-24 MED ORDER — SODIUM CHLORIDE 0.9 % IV BOLUS (SEPSIS)
1000.0000 mL | Freq: Once | INTRAVENOUS | Status: AC
  Administered 2016-04-24: 1000 mL via INTRAVENOUS

## 2016-04-24 MED ORDER — INSULIN ASPART 100 UNIT/ML ~~LOC~~ SOLN
0.0000 [IU] | Freq: Three times a day (TID) | SUBCUTANEOUS | Status: DC
Start: 2016-04-25 — End: 2016-04-27
  Administered 2016-04-25: 2 [IU] via SUBCUTANEOUS
  Administered 2016-04-26 (×2): 1 [IU] via SUBCUTANEOUS
  Administered 2016-04-26 – 2016-04-27 (×3): 2 [IU] via SUBCUTANEOUS
  Filled 2016-04-24 (×2): qty 1
  Filled 2016-04-24 (×4): qty 2

## 2016-04-24 MED ORDER — INSULIN DETEMIR 100 UNIT/ML ~~LOC~~ SOLN
15.0000 [IU] | Freq: Every day | SUBCUTANEOUS | Status: DC
  Filled 2016-04-24: qty 0.15

## 2016-04-24 MED ORDER — MAGNESIUM SULFATE 2 GM/50ML IV SOLN
2.0000 g | Freq: Once | INTRAVENOUS | Status: AC
  Administered 2016-04-24: 2 g via INTRAVENOUS
  Filled 2016-04-24: qty 50

## 2016-04-24 MED ORDER — AMLODIPINE BESYLATE 5 MG PO TABS
5.0000 mg | ORAL_TABLET | Freq: Every day | ORAL | Status: DC
  Administered 2016-04-25 – 2016-04-27 (×3): 5 mg via ORAL
  Filled 2016-04-24 (×3): qty 1

## 2016-04-24 MED ORDER — ONDANSETRON HCL 4 MG/2ML IJ SOLN
4.0000 mg | Freq: Four times a day (QID) | INTRAMUSCULAR | Status: DC | PRN
  Administered 2016-04-26 (×2): 4 mg via INTRAVENOUS
  Filled 2016-04-24 (×2): qty 2

## 2016-04-24 MED ORDER — SODIUM CHLORIDE 0.9 % IV SOLN
INTRAVENOUS | Status: DC
  Administered 2016-04-24 – 2016-04-26 (×3): via INTRAVENOUS

## 2016-04-24 MED ORDER — PANTOPRAZOLE SODIUM 40 MG IV SOLR
40.0000 mg | Freq: Two times a day (BID) | INTRAVENOUS | Status: DC
  Administered 2016-04-24 – 2016-04-27 (×6): 40 mg via INTRAVENOUS
  Filled 2016-04-24 (×6): qty 40

## 2016-04-24 MED ORDER — GABAPENTIN 300 MG PO CAPS
300.0000 mg | ORAL_CAPSULE | Freq: Every day | ORAL | Status: DC
  Administered 2016-04-24 – 2016-04-26 (×3): 300 mg via ORAL
  Filled 2016-04-24 (×3): qty 1

## 2016-04-24 MED ORDER — ACETAMINOPHEN 325 MG PO TABS: 650.0000 mg | ORAL_TABLET | ORAL | Status: DC | PRN

## 2016-04-24 MED ORDER — ATORVASTATIN CALCIUM 20 MG PO TABS
20.0000 mg | ORAL_TABLET | Freq: Every day | ORAL | Status: DC
  Administered 2016-04-24 – 2016-04-26 (×3): 20 mg via ORAL
  Filled 2016-04-24 (×3): qty 1

## 2016-04-24 MED ORDER — ONDANSETRON HCL 4 MG/2ML IJ SOLN
4.0000 mg | Freq: Once | INTRAMUSCULAR | Status: AC
Start: 2016-04-24 — End: 2016-04-24
  Administered 2016-04-24: 4 mg via INTRAVENOUS
  Filled 2016-04-24: qty 2

## 2016-04-24 MED ORDER — INSULIN DETEMIR 100 UNIT/ML FLEXPEN: 15.0000 [IU] | PEN_INJECTOR | Freq: Every morning | SUBCUTANEOUS | Status: DC

## 2016-04-24 MED ORDER — INSULIN ASPART 100 UNIT/ML ~~LOC~~ SOLN: 0.0000 [IU] | Freq: Every day | SUBCUTANEOUS | Status: DC

## 2016-04-24 MED ORDER — METOCLOPRAMIDE HCL 5 MG/ML IJ SOLN
10.0000 mg | Freq: Once | INTRAMUSCULAR | Status: AC
  Administered 2016-04-24: 10 mg via INTRAVENOUS

## 2016-04-24 MED ORDER — ACETAMINOPHEN 325 MG PO TABS: 650.0000 mg | ORAL_TABLET | Freq: Four times a day (QID) | ORAL | Status: DC | PRN

## 2016-04-24 MED ORDER — METOCLOPRAMIDE HCL 5 MG/ML IJ SOLN
INTRAMUSCULAR | Status: AC
Start: 2016-04-24 — End: 2016-04-24
  Administered 2016-04-24: 10 mg via INTRAVENOUS
  Filled 2016-04-24: qty 2

## 2016-04-24 NOTE — ED Notes (Signed)
Patient spitting up and dry heaving. Brown emesis noted in emesis bag.

## 2016-04-24 NOTE — H&P (Signed)
[facility] NAME: Sherry [name]    MR#:  [id number]  DATE OF BIRTH:  Apr 29, 1927  DATE OF ADMISSION:  04/24/2016  PRIMARY CARE PHYSICIAN: Dr Overton Mam   REQUESTING/REFERRING PHYSICIAN: Dr Gonzella Lex  CHIEF COMPLAINT:   Chief Complaint  Patient presents with  . Abdominal Pain    HISTORY OF PRESENT ILLNESS:  Sherry [name]  is a 83 y.o. female sent in from one of monitor for nausea vomiting and diarrhea.. Patient is a poor historian. Nausea vomiting going on for couple days also diarrhea a few times per day. In the ER has not had an episode of diarrhea. Patient states that she has not had any hematemesis. States she does have some blood in the bowel movements. Patient states that she had abdominal pain but now it is resolved. In the ER, she was found to be in atrial flutter. Of note she was recently in the hospital and discharged on Keflex for Proteus mirabilis pyelonephritis.  PAST MEDICAL HISTORY:   Past Medical History:  Diagnosis Date  . Diabetes mellitus without complication ([place])   . Hyperlipidemia   . Hypertension   . Pulmonary embolism ([place])     PAST SURGICAL HISTORY:   Past Surgical History:  Procedure Laterality Date  . APPENDECTOMY    . BACK SURGERY    . SHOULDER SURGERY      SOCIAL HISTORY:   Social History  Substance Use Topics  . Smoking status: Never Smoker  . Smokeless tobacco: Never Used  . Alcohol use No    FAMILY HISTORY:   Family History  Problem Relation Age of Onset  . Diabetes Mother   . Cancer Mother   . Cancer Father   . Bladder Cancer Neg Hx   . Kidney cancer Neg Hx     DRUG ALLERGIES:  No Known Allergies  REVIEW OF SYSTEMS:  CONSTITUTIONAL: No fever, fatigue or weakness. Positive for chills and sweats EYES: No blurred or double vision.  EARS, NOSE, AND THROAT: No tinnitus or ear pain. No sore throat. Positive for runny nose RESPIRATORY: No cough, positive  for shortness of breath. No wheezing or hemoptysis.  CARDIOVASCULAR: Positive for chest pain, no orthopnea.  GASTROINTESTINAL: Positive for nausea, vomiting, diarrhea and abdominal pain. No blood in bowel movements GENITOURINARY: No dysuria, hematuria. Chronic Foley catheter ENDOCRINE: No polyuria, nocturia,  HEMATOLOGY: No anemia, easy bruising or bleeding SKIN: No rash or lesion. MUSCULOSKELETAL: No joint pain or arthritis.   NEUROLOGIC: No tingling, numbness, weakness.  PSYCHIATRY: No anxiety or depression.   MEDICATIONS AT HOME:   Prior to Admission medications   Medication Sig Start Date End Date Taking? Authorizing Provider  acidophilus (RISAQUAD) CAPS capsule Take 1 capsule by mouth daily.   Yes Historical Provider, MD  apixaban (ELIQUIS) 5 MG TABS tablet Take 1 tablet (5 mg total) by mouth 2 (two) times daily. 03/08/16  Yes Erline Hau, MD  guaiFENesin (MUCINEX) 600 MG 12 hr tablet Take by mouth 2 (two) times daily.   Yes Historical Provider, MD  Insulin Detemir (LEVEMIR) 100 UNIT/ML Pen Inject 15 Units into the skin every morning.    Yes Historical Provider, MD  insulin lispro (HUMALOG) 100 UNIT/ML injection Inject into the skin 3 (three) times daily before meals. Sliding scale insulin. 0-99=0 units 100-149=2 units 150-199=3 units 200-249=4 units 250-299=5 units 300-349=6 units 350-399=7 units 400-449=8 units 450-499=9 units   Yes Historical Provider, MD  ondansetron (ZOFRAN) 4 MG  tablet Take 4 mg by mouth 2 (two) times daily. *May take every 8 hours as needed for nausea and vomiting   Yes Historical Provider, MD  pantoprazole (PROTONIX) 40 MG tablet Take 1 tablet (40 mg total) by mouth daily. 03/13/16  Yes Fritzi Mandes, MD  promethazine (PHENERGAN) 25 MG/ML injection Inject into the vein once.   Yes Historical Provider, MD  tamsulosin (FLOMAX) 0.4 MG CAPS capsule Take 1 capsule (0.4 mg total) by mouth daily. 03/14/16  Yes Fritzi Mandes, MD  acetaminophen (TYLENOL) 325 MG  tablet Take 650 mg by mouth every 4 (four) hours as needed for mild pain or moderate pain.    Historical Provider, MD  amLODipine (NORVASC) 5 MG tablet Take 5 mg by mouth daily.    Historical Provider, MD  atorvastatin (LIPITOR) 20 MG tablet Take 1 tablet by mouth at bedtime.    Historical Provider, MD  cephALEXin (KEFLEX) 500 MG capsule Take 1 capsule (500 mg total) by mouth 3 (three) times daily. 04/21/16   Srikar Sudini, MD  gabapentin (NEURONTIN) 300 MG capsule Take 1 capsule by mouth at bedtime.    Historical Provider, MD  lisinopril-hydrochlorothiazide (PRINZIDE,ZESTORETIC) 20-12.5 MG tablet Take 2 tablets by mouth daily.     Historical Provider, MD      VITAL SIGNS:  Blood pressure (!) 157/74, pulse 91, temperature 97.8 F (36.6 C), temperature source Oral, resp. rate (!) 21, height 5\' 4"  (1.626 m), weight 64.9 kg (143 lb), SpO2 96 %.  PHYSICAL EXAMINATION:  GENERAL:  83 y.o.-year-old patient lying in the bed with no acute distress.  EYES: Pupils equal, round, reactive to light and accommodation. No scleral icterus. Extraocular muscles intact.  HEENT: Head atraumatic, normocephalic. Oropharynx and nasopharynx clear.  NECK:  Supple, no jugular venous distention. No thyroid enlargement, no tenderness.  LUNGS: Normal breath sounds bilaterally, no wheezing, rales,rhonchi or crepitation. No use of accessory muscles of respiration.  CARDIOVASCULAR: S1, S2 normal. No murmurs, rubs, or gallops.  ABDOMEN: Soft, Slight tenderness lower abdomen, nondistended. Bowel sounds present. No organomegaly or mass.  EXTREMITIES:   1+ edema.  nocyanosis, or clubbing.  NEUROLOGIC: Cranial nerves II through XII are intact. Muscle strength 5/5 in all extremities. Sensation intact. Gait not checked.  PSYCHIATRIC: The patient is alert.  SKIN: No rash, lesion, or ulcer.   LABORATORY PANEL:   CBC  Recent Labs Lab 04/24/16 1339  WBC 9.8  HGB 11.8*  HCT 36.1  PLT 370    ------------------------------------------------------------------------------------------------------------------  Chemistries   Recent Labs Lab 04/24/16 1339  NA 137  K 3.5  CL 98*  CO2 28  GLUCOSE 186*  BUN 11  CREATININE 0.89  CALCIUM 8.5*  MG 1.6*  AST 17  ALT 9*  ALKPHOS 68  BILITOT 0.5   ------------------------------------------------------------------------------------------------------------------  Cardiac Enzymes  Recent Labs Lab 04/18/16 1814  TROPONINI <0.03   ------------------------------------------------------------------------------------------------------------------   EKG:   Atrial flutter 83 bpm for 1 block and interventricular conduction delay  IMPRESSION AND PLAN:   1. Acute gastroenteritis. Since the patient is out of facility will send off stool studies. Could be viral nature. Since the patient was recently on antibiotics it could also be C. difficile. Monitor for right now supportive care with nausea medications and IV fluids. Obtain abdominal x-ray. Reviewed CT scan from last admission that showed possible esophagitis will put on IV Protonix also. Also had fecal impaction at that time. 2. Atrial flutter 83 bpm 4-1 block. Give IV magnesium and IV fluids. Patient already on anticoagulation  for different reason. 4. Hypomagnesemia. Replace magnesium IV 5. Hypokalemia replace potassium IV  6. Chronic Foley catheter. Change Foley catheter since urine analysis still positive. Patient was on Keflex for urinary tract infection from last admission. Change to Rocephin since the patient is having nausea vomiting. 7. History of PE on anticoagulation 8. Essential hypertension on Norvasc and hold hydrochlorothiazide and lisinopril 9. Type 2 diabetes mellitus we'll put on sliding scale   All the records are reviewed and case discussed with ED provider. Management plans discussed with the patient, family and they are in agreement.  CODE STATUS: full  code   TOTAL TIME TAKING CARE OF THIS PATIENT: 50  minutes.    Loletha Grayer M.D on 04/24/2016 at 7:14 PM  Between 7am to 6pm - Pager - [phone]  After 6pm call admission pager [phone]  [facility]  [phone]  CC: Primary care physician; Dr Charyl Bigger

## 2016-04-24 NOTE — ED Notes (Signed)
Patient unable to tolerate fluids. Patient states she is still not feeling well. MD aware.

## 2016-04-24 NOTE — ED Notes (Signed)
Patient tolerated water. Patient denied saline crackers. Patient states she feels much better. No emesis or diarrhea noted while being here in the ED.

## 2016-04-24 NOTE — ED Triage Notes (Signed)
Per [facility], patient comes from [facility] for N/V/D that started on Thursday. Patient with chronic foley catheter. Patient was seen here 4/10 for a uti. Patient c/o generalized abdominal pain. A&O x4.

## 2016-04-24 NOTE — ED Provider Notes (Signed)
[facility] Emergency Department Provider Note  ____________________________________________  Time seen: Approximately 1:43 PM  I have reviewed the triage vital signs and the nursing notes.   HISTORY  Chief Complaint Abdominal Pain   HPI TALLYN HOLROYD is a 83 y.o. female with a history of diabetes, hypertension, hyperlipidemia, CKD, urinary retention with Foley (last changed during her last admission 04/19/16) who presents for evaluation of nausea vomiting and diarrhea. Patient reports that she has been having these symptoms for the last 3 days.She denies abdominal pain. Several episodes a day of nonbloody nonbilious emesis and black stools. No hematemesis or coffee-ground emesis. She denies lightheadedness, fever, chills, dysuria. Patient is still on Keflex for urinary tract infection. She is on Eliquis for PE. She does not know if she has had GIB in the past. No chest pain or shortness of breath.  Past Medical History:  Diagnosis Date  . Diabetes mellitus without complication ([place])   . Hyperlipidemia   . Hypertension     Patient Active Problem List   Diagnosis Date Noted  . Aphasia 04/18/2016  . Nausea & vomiting 03/27/2016  . Advance care planning   . UTI (urinary tract infection) 03/12/2016  . Chest pain 03/09/2016  . PE (pulmonary thromboembolism) ([place]) 02/26/2016  . Diabetes mellitus, type 2 ([place]) 02/26/2016  . Anemia 02/26/2016  . Chronic kidney disease (CKD), stage III (moderate) 02/26/2016  . Essential hypertension 02/26/2016  . Goals of care, counseling/discussion   . Adult failure to thrive syndrome   . Palliative care by specialist   . Sepsis ([place]) 11/26/2014    Past Surgical History:  Procedure Laterality Date  . APPENDECTOMY    . BACK SURGERY    . SHOULDER SURGERY      Prior to Admission medications   Medication Sig Start Date End Date Taking? Authorizing Provider  acidophilus (RISAQUAD) CAPS capsule Take 1 capsule by mouth  daily.   Yes Historical Provider, MD  apixaban (ELIQUIS) 5 MG TABS tablet Take 1 tablet (5 mg total) by mouth 2 (two) times daily. 03/08/16  Yes Erline Hau, MD  guaiFENesin (MUCINEX) 600 MG 12 hr tablet Take by mouth 2 (two) times daily.   Yes Historical Provider, MD  Insulin Detemir (LEVEMIR) 100 UNIT/ML Pen Inject 15 Units into the skin every morning.    Yes Historical Provider, MD  insulin lispro (HUMALOG) 100 UNIT/ML injection Inject into the skin 3 (three) times daily before meals. Sliding scale insulin. 0-99=0 units 100-149=2 units 150-199=3 units 200-249=4 units 250-299=5 units 300-349=6 units 350-399=7 units 400-449=8 units 450-499=9 units   Yes Historical Provider, MD  ondansetron (ZOFRAN) 4 MG tablet Take 4 mg by mouth 2 (two) times daily. *May take every 8 hours as needed for nausea and vomiting   Yes Historical Provider, MD  pantoprazole (PROTONIX) 40 MG tablet Take 1 tablet (40 mg total) by mouth daily. 03/13/16  Yes Fritzi Mandes, MD  promethazine (PHENERGAN) 25 MG/ML injection Inject into the vein once.   Yes Historical Provider, MD  tamsulosin (FLOMAX) 0.4 MG CAPS capsule Take 1 capsule (0.4 mg total) by mouth daily. 03/14/16  Yes Fritzi Mandes, MD  acetaminophen (TYLENOL) 325 MG tablet Take 650 mg by mouth every 4 (four) hours as needed for mild pain or moderate pain.    Historical Provider, MD  amLODipine (NORVASC) 5 MG tablet Take 5 mg by mouth daily.    Historical Provider, MD  atorvastatin (LIPITOR) 20 MG tablet Take 1 tablet by mouth  at bedtime.    Historical Provider, MD  cephALEXin (KEFLEX) 500 MG capsule Take 1 capsule (500 mg total) by mouth 3 (three) times daily. 04/21/16   Srikar Sudini, MD  gabapentin (NEURONTIN) 300 MG capsule Take 1 capsule by mouth at bedtime.    Historical Provider, MD  lisinopril-hydrochlorothiazide (PRINZIDE,ZESTORETIC) 20-12.5 MG tablet Take 2 tablets by mouth daily.     Historical Provider, MD    Allergies Patient has no known  allergies.  Family History  Problem Relation Age of Onset  . Diabetes Mother   . Bladder Cancer Neg Hx   . Kidney cancer Neg Hx     Social History Social History  Substance Use Topics  . Smoking status: Never Smoker  . Smokeless tobacco: Never Used  . Alcohol use No    Review of Systems  Constitutional: Negative for fever. Eyes: Negative for visual changes. ENT: Negative for sore throat. Neck: No neck pain  Cardiovascular: Negative for chest pain. Respiratory: Negative for shortness of breath. Gastrointestinal: Negative for abdominal pain. + nausea, vomiting and diarrhea. Genitourinary: Negative for dysuria. Musculoskeletal: Negative for back pain. Skin: Negative for rash. Neurological: Negative for headaches, weakness or numbness. Psych: No SI or HI  ____________________________________________   PHYSICAL EXAM:  VITAL SIGNS: ED Triage Vitals  Enc Vitals Group     BP 04/24/16 1329 (!) 152/72     Pulse Rate 04/24/16 1329 95     Resp --      Temp 04/24/16 1329 97.8 F (36.6 C)     Temp Source 04/24/16 1329 Oral     SpO2 04/24/16 1329 97 %     Weight 04/24/16 1325 143 lb (64.9 kg)     Height 04/24/16 1325 5\' 4"  (1.626 m)     Head Circumference --      Peak Flow --      Pain Score 04/24/16 1327 8     Pain Loc --      Pain Edu? --      Excl. in [place]? --     Constitutional: Alert and oriented, no distress, looks umcorfotable.  HEENT:      Head: Normocephalic and atraumatic.         Eyes: Conjunctivae are normal. Sclera is non-icteric. EOMI. PERRL      Mouth/Throat: Mucous membranes are dry.       Neck: Supple with no signs of meningismus. Cardiovascular: Regular rate and rhythm. No murmurs, gallops, or rubs. 2+ symmetrical distal pulses are present in all extremities. No JVD. Respiratory: Normal respiratory effort. Lungs are clear to auscultation bilaterally. No wheezes, crackles, or rhonchi.  Gastrointestinal: Soft, diffusely tender to palpation, non  distended with positive bowel sounds. No rebound or guarding. Genitourinary: No CVA tenderness. Rectal exam showing mucus only, no stool, guaiac negative Musculoskeletal: Nontender with normal range of motion in all extremities. No edema, cyanosis, or erythema of extremities. Neurologic: Normal speech and language. Face is symmetric. Moving all extremities. No gross focal neurologic deficits are appreciated. Skin: Skin is warm, dry and intact. No rash noted. Psychiatric: Mood and affect are normal. Speech and behavior are normal.  ____________________________________________   LABS (all labs ordered are listed, but only abnormal results are displayed)  Labs Reviewed  CBC WITH DIFFERENTIAL/PLATELET - Abnormal; Notable for the following:       Result Value   Hemoglobin 11.8 (*)    RDW 16.6 (*)    Neutro Abs 8.0 (*)    All other components within normal limits  COMPREHENSIVE METABOLIC PANEL - Abnormal; Notable for the following:    Chloride 98 (*)    Glucose, Bld 186 (*)    Calcium 8.5 (*)    Total Protein 6.2 (*)    Albumin 2.7 (*)    ALT 9 (*)    GFR calc non Af Amer 57 (*)    All other components within normal limits  URINALYSIS, COMPLETE (UACMP) WITH MICROSCOPIC - Abnormal; Notable for the following:    Color, Urine YELLOW (*)    APPearance CLOUDY (*)    Glucose, UA 50 (*)    Ketones, ur 5 (*)    Protein, ur 100 (*)    Leukocytes, UA LARGE (*)    Non Squamous Epithelial 6-30 (*)    All other components within normal limits  C DIFFICILE QUICK SCREEN W PCR REFLEX  URINE CULTURE  LIPASE, BLOOD  MAGNESIUM   ____________________________________________  EKG  ED ECG REPORT I, Rudene Re, the attending physician, personally viewed and interpreted this ECG.  Atrial flutter, rate of 83, left bundle branch block, prolonged QTC, normal axis, no ST elevations or depressions.   ____________________________________________  RADIOLOGY  None ____________________________________________   PROCEDURES  Procedure(s) performed: None Procedures Critical Care performed:  None ____________________________________________   INITIAL IMPRESSION / ASSESSMENT AND PLAN / ED COURSE  83 y.o. female with a history of diabetes, hypertension, hyperlipidemia, CKD, urinary retention with Foley (last changed during her last admission 04/19/16) who presents for evaluation of nausea vomiting and diarrhea x 3 days. Patient endorses the stools and she is on a blood thinner. Rectal exam was unable to obtain any stool only mucus. We'll check her hemoglobin and guaiac her stool when she has a bowel movement. We'll send it for C. difficile. We'll give IV fluids and Zofran. We'll check basic blood work for evidence of dehydration or electrolyte abnormalities. Will check urinalysis.  Clinical Course as of Apr 24 1844  Mon Apr 24, 2016  1613 Patient reports that she feels markedly improved after IVF. No vomiting or diarrhea in the ED after 3 hours of observation. Labs WNL. UA with blood and WBC but no bacteria. Patient is currently on keflex for UTI. Ucx added, will not change treatment at this time. No abdominal pain or tenderness on exam. Will PO challenge at this time.   [CV]  8889 Patient started to vomit after PO challenge. Will give reglan and another IVF bolus and try again.  [CV]  1694 Patient received several liters of fluid and continued to have significant nausea. EKG shows atrial flutter which patient has no history of. Rate is well controlled. We'll admit to the hospitalist at this time.  [CV]    Clinical Course User Index [CV] Rudene Re, MD    Pertinent labs & imaging results that were available during my care of the patient were reviewed by me and considered in my medical decision making (see chart for  details).    ____________________________________________   FINAL CLINICAL IMPRESSION(S) / ED DIAGNOSES  Final diagnoses:  Nausea vomiting and diarrhea  Atrial flutter, unspecified type ([place])      NEW MEDICATIONS STARTED DURING THIS VISIT:  New Prescriptions   No medications on file     Note:  This document was prepared using Dragon voice recognition software and may include unintentional dictation errors.    Rudene Re, MD 04/24/16 [phone]

## 2016-04-25 DIAGNOSIS — Z7189 Other specified counseling: Secondary | ICD-10-CM | POA: Diagnosis not present

## 2016-04-25 DIAGNOSIS — Z87440 Personal history of urinary (tract) infections: Secondary | ICD-10-CM | POA: Diagnosis not present

## 2016-04-25 DIAGNOSIS — D638 Anemia in other chronic diseases classified elsewhere: Secondary | ICD-10-CM | POA: Diagnosis present

## 2016-04-25 DIAGNOSIS — Z794 Long term (current) use of insulin: Secondary | ICD-10-CM | POA: Diagnosis not present

## 2016-04-25 DIAGNOSIS — I129 Hypertensive chronic kidney disease with stage 1 through stage 4 chronic kidney disease, or unspecified chronic kidney disease: Secondary | ICD-10-CM | POA: Diagnosis present

## 2016-04-25 DIAGNOSIS — R112 Nausea with vomiting, unspecified: Secondary | ICD-10-CM | POA: Diagnosis present

## 2016-04-25 DIAGNOSIS — Z66 Do not resuscitate: Secondary | ICD-10-CM | POA: Diagnosis present

## 2016-04-25 DIAGNOSIS — Z86711 Personal history of pulmonary embolism: Secondary | ICD-10-CM | POA: Diagnosis not present

## 2016-04-25 DIAGNOSIS — E876 Hypokalemia: Secondary | ICD-10-CM | POA: Diagnosis not present

## 2016-04-25 DIAGNOSIS — Z7901 Long term (current) use of anticoagulants: Secondary | ICD-10-CM | POA: Diagnosis not present

## 2016-04-25 DIAGNOSIS — N189 Chronic kidney disease, unspecified: Secondary | ICD-10-CM | POA: Diagnosis present

## 2016-04-25 DIAGNOSIS — Z515 Encounter for palliative care: Secondary | ICD-10-CM | POA: Diagnosis not present

## 2016-04-25 DIAGNOSIS — I4892 Unspecified atrial flutter: Secondary | ICD-10-CM | POA: Diagnosis not present

## 2016-04-25 DIAGNOSIS — K529 Noninfective gastroenteritis and colitis, unspecified: Secondary | ICD-10-CM | POA: Diagnosis not present

## 2016-04-25 DIAGNOSIS — E785 Hyperlipidemia, unspecified: Secondary | ICD-10-CM | POA: Diagnosis present

## 2016-04-25 DIAGNOSIS — Z79899 Other long term (current) drug therapy: Secondary | ICD-10-CM | POA: Diagnosis not present

## 2016-04-25 DIAGNOSIS — E1122 Type 2 diabetes mellitus with diabetic chronic kidney disease: Secondary | ICD-10-CM | POA: Diagnosis present

## 2016-04-25 DIAGNOSIS — E11649 Type 2 diabetes mellitus with hypoglycemia without coma: Secondary | ICD-10-CM | POA: Diagnosis not present

## 2016-04-25 LAB — CULTURE, BLOOD (ROUTINE X 2)
CULTURE: NO GROWTH
CULTURE: NO GROWTH
Special Requests: ADEQUATE
Special Requests: ADEQUATE

## 2016-04-25 LAB — GLUCOSE, CAPILLARY
GLUCOSE-CAPILLARY: 102 mg/dL — AB (ref 65–99)
GLUCOSE-CAPILLARY: 32 mg/dL — AB (ref 65–99)
GLUCOSE-CAPILLARY: 167 mg/dL — AB (ref 65–99)
GLUCOSE-CAPILLARY: 124 mg/dL — AB (ref 65–99)
Glucose-Capillary: 60 mg/dL — ABNORMAL LOW (ref 65–99)
Glucose-Capillary: 184 mg/dL — ABNORMAL HIGH (ref 65–99)
Glucose-Capillary: 173 mg/dL — ABNORMAL HIGH (ref 65–99)

## 2016-04-25 LAB — CBC
HCT: 28.5 % — ABNORMAL LOW (ref 35.0–47.0)
Hemoglobin: 9.3 g/dL — ABNORMAL LOW (ref 12.0–16.0)
MCH: 27.1 pg (ref 26.0–34.0)
MCHC: 32.4 g/dL (ref 32.0–36.0)
MCV: 83.6 fL (ref 80.0–100.0)
PLATELETS: 351 10*3/uL (ref 150–440)
RBC: 3.41 MIL/uL — AB (ref 3.80–5.20)
RDW: 16.6 % — AB (ref 11.5–14.5)
WBC: 11.3 10*3/uL — ABNORMAL HIGH (ref 3.6–11.0)

## 2016-04-25 LAB — BASIC METABOLIC PANEL
Anion gap: 6 (ref 5–15)
BUN: 12 mg/dL (ref 6–20)
CALCIUM: 8 mg/dL — AB (ref 8.9–10.3)
CHLORIDE: 106 mmol/L (ref 101–111)
CO2: 30 mmol/L (ref 22–32)
CREATININE: 0.89 mg/dL (ref 0.44–1.00)
GFR, EST NON AFRICAN AMERICAN: 57 mL/min — AB (ref >60)
Glucose, Bld: 38 mg/dL — CL (ref 65–99)
Potassium: 2.5 mmol/L — CL (ref 3.5–5.1)
Sodium: 142 mmol/L (ref 135–145)

## 2016-04-25 LAB — MAGNESIUM: Magnesium: 2.1 mg/dL (ref 1.7–2.4)

## 2016-04-25 LAB — URINE CULTURE: Culture: >=100000 — AB

## 2016-04-25 LAB — POTASSIUM: Potassium: 4 mmol/L (ref 3.5–5.1)

## 2016-04-25 MED ORDER — HYDROCHLOROTHIAZIDE 12.5 MG PO CAPS
12.5000 mg | ORAL_CAPSULE | Freq: Every day | ORAL | Status: DC
  Administered 2016-04-25 – 2016-04-27 (×3): 12.5 mg via ORAL
  Filled 2016-04-25 (×3): qty 1

## 2016-04-25 MED ORDER — LISINOPRIL 20 MG PO TABS
20.0000 mg | ORAL_TABLET | Freq: Every day | ORAL | Status: DC
  Administered 2016-04-25 – 2016-04-27 (×3): 20 mg via ORAL
  Filled 2016-04-25 (×3): qty 1

## 2016-04-25 MED ORDER — DEXTROSE 50 % IV SOLN
INTRAVENOUS | Status: AC
  Filled 2016-04-25: qty 50

## 2016-04-25 MED ORDER — DEXTROSE 50 % IV SOLN
25.0000 mL | Freq: Once | INTRAVENOUS | Status: AC
  Administered 2016-04-25: 07:00:00 25 mL via INTRAVENOUS

## 2016-04-25 MED ORDER — SODIUM CHLORIDE 0.9 % IV SOLN
Freq: Once | INTRAVENOUS | Status: AC
  Administered 2016-04-25: 11:00:00 via INTRAVENOUS
  Filled 2016-04-25: qty 1000

## 2016-04-25 NOTE — Progress Notes (Signed)
Family Meeting Note  Advance Directive:yes  Today a meeting took place with the Patient.  The following clinical team members were present during this meeting:MD  The following were discussed:Patient's diagnosis: , Patient's progosis: > 12 months and Goals for treatment: Full Code  Additional follow-up to be provided: Palliative care c/s  Time spent during discussion:20 minutes  Max Sane, MD

## 2016-04-25 NOTE — Progress Notes (Signed)
Inpatient Diabetes Program Recommendations  AACE/ADA: New Consensus Statement on Inpatient Glycemic Control (2015)  Target Ranges:  Prepandial:   less than 140 mg/dL      Peak postprandial:   less than 180 mg/dL (1-2 hours)      Critically ill patients:  140 - 180 mg/dL   Lab Results  Component Value Date   GLUCAP 102 (H) 04/25/2016   HGBA1C 5.9 (H) 04/19/2016    Review of Glycemic Control  Results for ALYRA, PATTY (MRN [id number]) as of 04/25/2016 08:34  Ref. Range 04/24/2016 22:04 04/25/2016 07:00 04/25/2016 07:21  Glucose-Capillary Latest Ref Range: 65 - 99 mg/dL 99 32 (LL) 102 (H)    Diabetes history: DM2  Outpatient Diabetes medications: Levemir 13 units QAM,  Humalog 0-9 units tid before meals  Current orders for Inpatient glycemic control:  Levemir 13 units QAM, Novolog 0-9 units TID with meals, Novolog 0-5 units QHS  Inpatient Diabetes Program Recommendations: Consider discontinuing Levemir at this time.   Gentry Fitz, RN, BA, MHA, CDE Diabetes Coordinator Inpatient Diabetes Program  [phone] (Team Pager) [phone] ([place]) 04/25/2016 8:37 AM

## 2016-04-25 NOTE — Progress Notes (Signed)
[place] [facility] [place] NAME: Sherry [name]    MR#:  [id number]  DATE OF BIRTH:  Mar 04, 1927  SUBJECTIVE:  CHIEF COMPLAINT:   Chief Complaint  Patient presents with  . Abdominal Pain   Weak and tremulous, not having any further diarrhea/abd pain/vomiting BS 32 earlier and came up to 102 later REVIEW OF SYSTEMS:    Review of Systems  Constitutional: Positive for malaise/fatigue. Negative for chills and fever.  HENT: Negative for sore throat.   Eyes: Negative for blurred vision, double vision and pain.  Respiratory: Negative for cough, hemoptysis, shortness of breath and wheezing.   Cardiovascular: Negative for chest pain, palpitations, orthopnea and leg swelling.  Gastrointestinal: Negative for abdominal pain, constipation, diarrhea, heartburn, nausea and vomiting.  Genitourinary: Negative for dysuria and hematuria.  Musculoskeletal: Negative for back pain and joint pain.  Skin: Negative for rash.  Neurological: Positive for weakness. Negative for sensory change, speech change, focal weakness and headaches.  Endo/Heme/Allergies: Does not bruise/bleed easily.  Psychiatric/Behavioral: Negative for depression. The patient is not nervous/anxious.    DRUG ALLERGIES:  No Known Allergies  VITALS:  Blood pressure (!) 170/73, pulse (!) 106, temperature 98.4 F (36.9 C), temperature source Oral, resp. rate 18, height 5\' 4"  (1.626 m), weight 64.9 kg (143 lb), SpO2 96 %.  PHYSICAL EXAMINATION:   Physical Exam  GENERAL:  83 y.o.-year-old patient lying in the bed with no acute distress.  EYES: Pupils equal, round, reactive to light and accommodation. No scleral icterus. Extraocular muscles intact.  HEENT: Head atraumatic, normocephalic. Oropharynx and nasopharynx clear.  NECK:  Supple, no jugular venous distention. No thyroid enlargement, no tenderness.  LUNGS: Normal breath sounds bilaterally, no wheezing, rales, rhonchi. No use of accessory muscles  of respiration.  CARDIOVASCULAR: S1, S2 normal. No murmurs, rubs, or gallops.  ABDOMEN: Soft. No tenderness. Foley catheter in place EXTREMITIES: No cyanosis, clubbing or edema b/l.    NEUROLOGIC: Cranial nerves II through XII are intact. No focal Motor or sensory deficits b/l. tremulous PSYCHIATRIC: The patient is alert and oriented SKIN: No obvious rash, lesion, or ulcer.   LABORATORY PANEL:   CBC  Recent Labs Lab 04/25/16 0513  WBC 11.3*  HGB 9.3*  HCT 28.5*  PLT 351   ------------------------------------------------------------------------------------------------------------------ Chemistries   Recent Labs Lab 04/24/16 1339 04/25/16 0513  NA 137 142  K 3.5 2.5*  CL 98* 106  CO2 28 30  GLUCOSE 186* 38*  BUN 11 12  CREATININE 0.89 0.89  CALCIUM 8.5* 8.0*  MG 1.6* 2.1  AST 17  --   ALT 9*  --   ALKPHOS 68  --   BILITOT 0.5  --    ------------------------------------------------------------------------------------------------------------------  Cardiac Enzymes  Recent Labs Lab 04/18/16 1814  TROPONINI <0.03   ------------------------------------------------------------------------------------------------------------------  RADIOLOGY:  No results found.  ASSESSMENT AND PLAN:  83 y.o. female admitted for nausea vomiting and diarrhea  * Hypoglycemia with IDDM Due to poor oral intake BS - 32. STAT D 50 given and orange juice as need and monitor. Patient mental status improved Consider D5-NS at 50 ml/hr if no improvement   * Acute gastroenteritis: pending stool studies as she's had no BM while inpt. Could be viral nature. Since the patient was recently on antibiotics it could also be C. difficile. Monitor for right now supportive care with nausea medications and IV fluids.   * Hypokalemia: replete and recheck  * chronic Atrial flutter: rate controlled   * Hypomagnesemia. Replace  magnesium IV  * Chronic Foley catheter. Change Foley catheter since  urine analysis still positive. Patient was on Keflex for urinary tract infection from last admission. Change to Rocephin since the patient is having nausea vomiting.  * History of PE on anticoagulation with eliquis  * Essential hypertension on Norvasc and hold hydrochlorothiazide and lisinopril    All the records are reviewed and case discussed with Care Management/Social Worker Management plans discussed with the patient, nursing and they are in agreement.  CODE STATUS: FULL CODE, I addressed it again and she would like to be full code, will request palliative care c/s due to recurrent admissions.  DVT Prophylaxis: SCDs  TOTAL CC TIME TAKING CARE OF THIS PATIENT: 35 minutes.   POSSIBLE D/C IN 1-2 DAYS, DEPENDING ON CLINICAL CONDITION.  Max Sane M.D on 04/25/2016 at 9:55 AM  Between 7am to 6pm - Pager - [phone]  After 6pm go to [email or web address] - password [email or web address] [facility]  Office  [phone]  CC: Primary care physician; [facility]  Note: This dictation was prepared with Dragon dictation along with smaller phrase technology. Any transcriptional errors that result from this process are unintentional.

## 2016-04-25 NOTE — Progress Notes (Signed)
MEDICATION RELATED CONSULT NOTE - INITIAL   Pharmacy Consult for electrolyte monitoring and replacement Indication: hypokalemia  No Known Allergies  Patient Measurements: Height: 5\' 4"  (162.6 cm) Weight: 143 lb (64.9 kg) IBW/kg (Calculated) : 54.7  Vital Signs: Temp: 98 F (36.7 C) (04/17 1929) Temp Source: Oral (04/17 1929) BP: 127/49 (04/17 1929) Pulse Rate: 77 (04/17 1929) Intake/Output from previous day: 04/16 0701 - 04/17 0700 In: 2310.8 [I.V.:250.8; IV Piggyback:2050] Out: 950 [Urine:950] Intake/Output from this shift: No intake/output data recorded.  Labs:  Recent Labs  04/24/16 1339 04/25/16 0513  WBC 9.8 11.3*  HGB 11.8* 9.3*  HCT 36.1 28.5*  PLT 370 351  CREATININE 0.89 0.89  MG 1.6* 2.1  ALBUMIN 2.7*  --   PROT 6.2*  --   AST 17  --   ALT 9*  --   ALKPHOS 68  --   BILITOT 0.5  --    Estimated Creatinine Clearance: 39.2 mL/min (by C-G formula based on SCr of 0.89 mg/dL).   Medical History: Past Medical History:  Diagnosis Date  . Diabetes mellitus without complication ([place])   . Hyperlipidemia   . Hypertension   . Pulmonary embolism [facility])    Assessment: Pharmacy consulted to monitor and replace electrolytes as needed in this 83 year old female admitted with gastroenteritis. She received KCl 30 mEq IV and Mg 2 g IV on admission last night.    Goal of Therapy: Electrolytes WNL  Plan:  4/17 PM: K+ 4.0 no replacement needed at this time. Will recheck electrolytes with AM labs  Pernell Dupre, PharmD Clinical Pharmacist 04/25/2016,9:02 PM

## 2016-04-25 NOTE — Progress Notes (Signed)
MEDICATION RELATED CONSULT NOTE - INITIAL   Pharmacy Consult for electrolyte monitoring and replacement Indication: hypokalemia  No Known Allergies  Patient Measurements: Height: 5\' 4"  (162.6 cm) Weight: 143 lb (64.9 kg) IBW/kg (Calculated) : 54.7  Vital Signs:   Intake/Output from previous day: 04/16 0701 - 04/17 0700 In: 2310.8 [I.V.:250.8; IV Piggyback:2050] Out: 950 [Urine:950] Intake/Output from this shift: No intake/output data recorded.  Labs:  Recent Labs  04/24/16 1339 04/25/16 0513  WBC 9.8 11.3*  HGB 11.8* 9.3*  HCT 36.1 28.5*  PLT 370 351  CREATININE 0.89 0.89  MG 1.6* 2.1  ALBUMIN 2.7*  --   PROT 6.2*  --   AST 17  --   ALT 9*  --   ALKPHOS 68  --   BILITOT 0.5  --    Estimated Creatinine Clearance: 39.2 mL/min (by C-G formula based on SCr of 0.89 mg/dL).   Medical History: Past Medical History:  Diagnosis Date  . Diabetes mellitus without complication ([place])   . Hyperlipidemia   . Hypertension   . Pulmonary embolism [facility])    Assessment: Pharmacy consulted to monitor and replace electrolytes as needed in this 83 year old female admitted with gastroenteritis. She received KCl 30 mEq IV and Mg 2 g IV on admission last night.   K = 2.5 this morning Mg = 2.1 this morning  Goal of Therapy: Electrolytes WNL  Plan:  Ordered KCl 80 mEq/1000 mL IV once to infuse over 8 hours.  Will recheck K one hour after infusion.  Lenis Noon, PharmD Clinical Pharmacist 04/25/2016,10:03 AM

## 2016-04-26 DIAGNOSIS — Z7189 Other specified counseling: Secondary | ICD-10-CM

## 2016-04-26 DIAGNOSIS — I4892 Unspecified atrial flutter: Secondary | ICD-10-CM

## 2016-04-26 DIAGNOSIS — K529 Noninfective gastroenteritis and colitis, unspecified: Principal | ICD-10-CM

## 2016-04-26 DIAGNOSIS — Z515 Encounter for palliative care: Secondary | ICD-10-CM

## 2016-04-26 LAB — BASIC METABOLIC PANEL
Anion gap: 4 — ABNORMAL LOW (ref 5–15)
BUN: 9 mg/dL (ref 6–20)
CHLORIDE: 106 mmol/L (ref 101–111)
CO2: 27 mmol/L (ref 22–32)
Calcium: 7.8 mg/dL — ABNORMAL LOW (ref 8.9–10.3)
Creatinine, Ser: 0.89 mg/dL (ref 0.44–1.00)
GFR calc Af Amer: >60 mL/min (ref >60)
GFR calc non Af Amer: 57 mL/min — ABNORMAL LOW (ref >60)
GLUCOSE: 144 mg/dL — AB (ref 65–99)
POTASSIUM: 3.2 mmol/L — AB (ref 3.5–5.1)
Sodium: 137 mmol/L (ref 135–145)

## 2016-04-26 LAB — MAGNESIUM: MAGNESIUM: 1.6 mg/dL — AB (ref 1.7–2.4)

## 2016-04-26 LAB — GLUCOSE, CAPILLARY
GLUCOSE-CAPILLARY: 162 mg/dL — AB (ref 65–99)
GLUCOSE-CAPILLARY: 160 mg/dL — AB (ref 65–99)
Glucose-Capillary: 132 mg/dL — ABNORMAL HIGH (ref 65–99)
Glucose-Capillary: 149 mg/dL — ABNORMAL HIGH (ref 65–99)

## 2016-04-26 LAB — CBC
HEMATOCRIT: 28.4 % — AB (ref 35.0–47.0)
HEMOGLOBIN: 9.2 g/dL — AB (ref 12.0–16.0)
MCH: 27.2 pg (ref 26.0–34.0)
MCHC: 32.4 g/dL (ref 32.0–36.0)
MCV: 83.9 fL (ref 80.0–100.0)
Platelets: 307 10*3/uL (ref 150–440)
RBC: 3.39 MIL/uL — AB (ref 3.80–5.20)
RDW: 16.7 % — ABNORMAL HIGH (ref 11.5–14.5)
WBC: 5.3 10*3/uL (ref 3.6–11.0)

## 2016-04-26 LAB — POTASSIUM: Potassium: 3.8 mmol/L (ref 3.5–5.1)

## 2016-04-26 MED ORDER — POTASSIUM CHLORIDE CRYS ER 20 MEQ PO TBCR
40.0000 meq | EXTENDED_RELEASE_TABLET | Freq: Once | ORAL | Status: AC
  Administered 2016-04-26: 09:00:00 40 meq via ORAL
  Filled 2016-04-26: qty 2

## 2016-04-26 MED ORDER — MAGNESIUM SULFATE 2 GM/50ML IV SOLN
2.0000 g | Freq: Once | INTRAVENOUS | Status: AC
  Administered 2016-04-26: 20:00:00 2 g via INTRAVENOUS
  Filled 2016-04-26: qty 50

## 2016-04-26 NOTE — Clinical Social Work Note (Signed)
Clinical Social Work Assessment  Patient Details  Name: Sherry [name] MRN: [id number] Date of Birth: 1927-12-09  Date of referral:  04/26/16               Reason for consult:  Facility Placement, Discharge Planning                Permission sought to share information with:  Family Supports Permission granted to share information::  Yes, Verbal Permission Granted  Name::     Sherry [name]  Relationship::  daughter  Contact Information:  [phone]  Housing/Transportation Living arrangements for the past 2 months:  [place] of Information:  Patient Patient Interpreter Needed:  None Criminal Activity/Legal Involvement Pertinent to Current Situation/Hospitalization:  No - Comment as needed Significant Relationships:  Adult Children Lives with:  Facility Resident Do you feel safe going back to the place where you live?  Yes Need for family participation in patient care:  No (Coment) (Pt is alert and oriented)  Care giving concerns:  No care giving concerns identified.   Social Worker assessment / plan:  CSW met with pt to address consult for pt as she was admitted from [facility]. CSW introduced herself and explained of social work. CSW also explained the process of returning to facility. Pt is agreeable to returning to [facility] to continue to continue her rehab. CSW will reach out to pt's daughter. CSW will update facility and continue to follow.   Employment status:  Retired Nurse, adult PT Recommendations:  Not assessed at this time Information / Referral to community resources:  Other (Comment Required) ([place])  Patient/Family's Response to care:  Pt was appreciative of CSW support  Patient/Family's Understanding of and Emotional Response to Diagnosis, Current Treatment, and Prognosis:  Pt understands that she will return to facility to continue her rehab.   Emotional Assessment Appearance:  Appears stated  age Attitude/Demeanor/Rapport:  Other (Appropriate) Affect (typically observed):  Accepting, Adaptable, Pleasant Orientation:  Oriented to Self, Oriented to Place, Oriented to  Time, Oriented to Situation Alcohol / Substance use:  Not Applicable Psych involvement (Current and /or in the community):  No (Comment)  Discharge Needs  Concerns to be addressed:  Adjustment to Illness Readmission within the last 30 days:  Yes Current discharge risk:  Chronically ill Barriers to Discharge:  Continued Medical Work up   [employer], LCSW 04/26/2016, 4:00 PM

## 2016-04-26 NOTE — Progress Notes (Signed)
MEDICATION RELATED CONSULT NOTE - INITIAL   Pharmacy Consult for electrolyte monitoring and replacement Indication: hypokalemia and hypomag  No Known Allergies  Patient Measurements: Height: 5\' 4"  (162.6 cm) Weight: 143 lb (64.9 kg) IBW/kg (Calculated) : 54.7  Vital Signs: Temp: 98.9 F (37.2 C) (04/18 0811) Temp Source: Oral (04/18 0811) BP: 148/49 (04/18 0811) Pulse Rate: 82 (04/18 0811) Intake/Output from previous day: 04/17 0701 - 04/18 0700 In: 2544.7 [P.O.:240; I.V.:2254.7; IV Piggyback:50] Out: 625 [Urine:625] Intake/Output from this shift: No intake/output data recorded.  Labs:  Recent Labs  04/24/16 1339 04/25/16 0513 04/26/16 0728 04/26/16 1825  WBC 9.8 11.3* 5.3  --   HGB 11.8* 9.3* 9.2*  --   HCT 36.1 28.5* 28.4*  --   PLT 370 351 307  --   CREATININE 0.89 0.89 0.89  --   MG 1.6* 2.1  --  1.6*  ALBUMIN 2.7*  --   --   --   PROT 6.2*  --   --   --   AST 17  --   --   --   ALT 9*  --   --   --   ALKPHOS 68  --   --   --   BILITOT 0.5  --   --   --    Estimated Creatinine Clearance: 39.2 mL/min (by C-G formula based on SCr of 0.89 mg/dL).   Medical History: Past Medical History:  Diagnosis Date  . Diabetes mellitus without complication ([place])   . Hyperlipidemia   . Hypertension   . Pulmonary embolism [facility])    Assessment: Pharmacy consulted to monitor and replace electrolytes as needed in this 83 year old female admitted with gastroenteritis.   Goal of Therapy: Electrolytes WNL  Plan:  4/18 1830 mag = 1.6 and K = 3.8 Will give magnesium 2 g IV x 1 Will f/u K and mag with AM labs  Darrow Bussing, PharmD Pharmacy Resident 04/26/2016 7:07 PM

## 2016-04-26 NOTE — Consult Note (Signed)
Consultation Note Date: 04/26/16  Patient Name: Sherry [name]  DOB: 04/12/1927  MRN: [id number]  Age / Sex: 83 y.o., female  PCP: [facility] Referring Physician: Loletha Grayer, MD  Reason for Consultation: Establishing goals of care  HPI/Patient Profile: 83 y.o. female  with past medical history of pulmonary embolism, hypertension, hyperlipidemia, and diabetes admitted on 04/24/2016 with abdominal pain, nausea, vomiting, diarrhea. Multiple admissions--most recent for UTI with chronic foley. UA positive. Stool pending. Recent CT revealed possible esophagitis and fecal impaction. Supportive care with IVF and medications as needed for nausea. FULL code. Palliative medicine consultation for goals of care.       Clinical Assessment and Goals of Care: I have reviewed medical records and met the patient at bedside to discuss diagnosis, GOC, EOL wishes, disposition and options. No family at bedside. Patient awake, alert, answers questions appropriately. Complains of nausea this morning.   Introduced Palliative Medicine as specialized medical care for people living with serious illness. It focuses on providing relief from the symptoms and stress of a serious illness. The goal is to improve quality of life for both the patient and the family.  We discussed a brief life review of the patient. She has had multiple hospitalizations this year. Back and forth from rehab to hospital. Prior to this, she was living at home with her daughter. Patient tells me at rehab she was ambulating with walker with assist.     Discussed diagnoses and interventions.   Advanced directives, concepts specific to code status, and artifical feeding and hydration were discussed. Patient does not have a designated HCPOA. Asked if her heart stop and she naturally died, would she want to be resuscitated and on life support. Patient  tells me "yes, I want everything." Educated on my recommendation for DNR/DNI with age, frailty, and chronic co-morbidities. Encouraged she discuss these big decisions she may be faced with in the future with her family.     Palliative Care services outpatient were explained and offered.  Questions and concerns were addressed. Voicemail left for daughter, [name].   SUMMARY OF RECOMMENDATIONS    FULL code. FULL scope treatment.  Voicemail left for family to arrange [place] meeting.   Patient/family need further education on advanced directives/code status.   Please have palliative services follow at SNF on discharge.   PMT will continue to support patient/family through hospitalization.   Code Status/Advance Care Planning:  Full code   Symptom Management:   Per attending  Palliative Prophylaxis:   Aspiration, Delirium Protocol, Oral Care and Turn Reposition  Additional Recommendations (Limitations, Scope, Preferences):  Full Scope Treatment  Psycho-social/Spiritual:   Desire for further Chaplaincy support: no  Additional Recommendations: Caregiving  Support/Resources and Education on Hospice  Prognosis:   Unable to determine  Discharge Planning: [place] for rehab with Palliative care service follow-up      Primary Diagnoses: Present on Admission: . Gastroenteritis  I have reviewed the medical record, interviewed the patient and family, and examined the patient. The  following aspects are pertinent.  Past Medical History:  Diagnosis Date  . Diabetes mellitus without complication ([place])   . Hyperlipidemia   . Hypertension   . Pulmonary embolism [facility])    Social History   Social History  . Marital status: Single    Spouse name: N/A  . Number of children: N/A  . Years of education: N/A   Social History Main Topics  . Smoking status: Never Smoker  . Smokeless tobacco: Never Used  . Alcohol use No  . Drug use: No  . Sexual activity: No     Other Topics Concern  . None   Social History Narrative  . None   Family History  Problem Relation Age of Onset  . Diabetes Mother   . Cancer Mother   . Cancer Father   . Bladder Cancer Neg Hx   . Kidney cancer Neg Hx    Scheduled Meds: . acidophilus  1 capsule Oral Daily  . amLODipine  5 mg Oral Daily  . apixaban  5 mg Oral BID  . atorvastatin  20 mg Oral QHS  . gabapentin  300 mg Oral QHS  . lisinopril  20 mg Oral Daily   And  . hydrochlorothiazide  12.5 mg Oral Daily  . insulin aspart  0-5 Units Subcutaneous QHS  . insulin aspart  0-9 Units Subcutaneous TID WC  . pantoprazole (PROTONIX) IV  40 mg Intravenous Q12H  . potassium chloride  40 mEq Oral Once  . tamsulosin  0.4 mg Oral Daily   Continuous Infusions: . sodium chloride 50 mL/hr at 04/26/16 1600  . cefTRIAXone (ROCEPHIN)  IV     PRN Meds:.acetaminophen **OR** acetaminophen, ondansetron (ZOFRAN) IV Medications Prior to Admission:  Prior to Admission medications   Medication Sig Start Date End Date Taking? Authorizing Provider  acidophilus (RISAQUAD) CAPS capsule Take 1 capsule by mouth daily.   Yes Historical Provider, MD  apixaban (ELIQUIS) 5 MG TABS tablet Take 1 tablet (5 mg total) by mouth 2 (two) times daily. 03/08/16  Yes Erline Hau, MD  guaiFENesin (MUCINEX) 600 MG 12 hr tablet Take by mouth 2 (two) times daily.   Yes Historical Provider, MD  Insulin Detemir (LEVEMIR) 100 UNIT/ML Pen Inject 15 Units into the skin every morning.    Yes Historical Provider, MD  insulin lispro (HUMALOG) 100 UNIT/ML injection Inject into the skin 3 (three) times daily before meals. Sliding scale insulin. 0-99=0 units 100-149=2 units 150-199=3 units 200-249=4 units 250-299=5 units 300-349=6 units 350-399=7 units 400-449=8 units 450-499=9 units   Yes Historical Provider, MD  ondansetron (ZOFRAN) 4 MG tablet Take 4 mg by mouth 2 (two) times daily. *May take every 8 hours as needed for nausea and vomiting    Yes Historical Provider, MD  pantoprazole (PROTONIX) 40 MG tablet Take 1 tablet (40 mg total) by mouth daily. 03/13/16  Yes Fritzi Mandes, MD  promethazine (PHENERGAN) 25 MG/ML injection Inject into the vein once.   Yes Historical Provider, MD  tamsulosin (FLOMAX) 0.4 MG CAPS capsule Take 1 capsule (0.4 mg total) by mouth daily. 03/14/16  Yes Fritzi Mandes, MD  acetaminophen (TYLENOL) 325 MG tablet Take 650 mg by mouth every 4 (four) hours as needed for mild pain or moderate pain.    Historical Provider, MD  amLODipine (NORVASC) 5 MG tablet Take 5 mg by mouth daily.    Historical Provider, MD  atorvastatin (LIPITOR) 20 MG tablet Take 1 tablet by mouth at bedtime.    Historical Provider,  MD  cephALEXin (KEFLEX) 500 MG capsule Take 1 capsule (500 mg total) by mouth 3 (three) times daily. 04/21/16   Srikar Sudini, MD  gabapentin (NEURONTIN) 300 MG capsule Take 1 capsule by mouth at bedtime.    Historical Provider, MD  lisinopril-hydrochlorothiazide (PRINZIDE,ZESTORETIC) 20-12.5 MG tablet Take 2 tablets by mouth daily.     Historical Provider, MD   No Known Allergies Review of Systems  Gastrointestinal: Positive for diarrhea, nausea and vomiting.  Neurological: Positive for weakness.  All other systems reviewed and are negative.  Physical Exam  Constitutional: She is oriented to person, place, and time. She is cooperative. She appears ill.  HENT:  Head: Normocephalic and atraumatic.  Cardiovascular: An irregularly irregular rhythm present.  Pulmonary/Chest: Effort normal and breath sounds normal.  Abdominal: Normal appearance.  Neurological: She is alert and oriented to person, place, and time.  Skin: Skin is warm and dry. There is pallor.  Psychiatric: She has a normal mood and affect. Her speech is normal and behavior is normal.  Nursing note and vitals reviewed.  Vital Signs: BP (!) 161/53 (BP Location: Right Arm)   Pulse 78   Temp 98 F (36.7 C) (Oral)   Resp 18   Ht 5' 4"  (1.626 m)   Wt 64.9  kg (143 lb)   SpO2 93%   BMI 24.55 kg/m  Pain Assessment: 0-10   Pain Score: 0-No pain   SpO2: SpO2: 93 % O2 Device:SpO2: 93 % O2 Flow Rate: .   IO: Intake/output summary:   Intake/Output Summary (Last 24 hours) at 04/27/16 0830 Last data filed at 04/27/16 3335  Gross per 24 hour  Intake             2047 ml  Output              700 ml  Net             1347 ml    LBM: Last BM Date: 04/24/16 Baseline Weight: Weight: 64.9 kg (143 lb) Most recent weight: Weight: 64.9 kg (143 lb)     Palliative Assessment/Data: PPS 40%   Flowsheet Rows     Most Recent Value  Intake Tab  Referral Department  Hospitalist  Unit at Time of Referral  Oncology Unit  Palliative Care Primary Diagnosis  Cardiac  Date Notified  04/24/16  Palliative Care Type  Return patient Palliative Care  Reason for referral  Clarify Goals of Care  Date of Admission  04/24/16  Date first seen by Palliative Care  04/27/16  # of days IP prior to Palliative referral  0  Clinical Assessment  Palliative Performance Scale Score  40%  Psychosocial & Spiritual Assessment  Palliative Care Outcomes  Patient/Family meeting held?  Yes  Who was at the meeting?  patient  Palliative Care Outcomes  Clarified goals of care, Provided psychosocial or spiritual support, Linked to palliative care logitudinal support, ACP counseling assistance      Time In: 0900 Time Out: 0950 Time Total: 31mn Greater than 50%  of this time was spent counseling and coordinating care related to the above assessment and plan.  Signed by:  MIhor Dow FNP-C Palliative Medicine Team  Phone: [phone]: [phone] Please contact Palliative Medicine Team phone at [phone] questions and concerns.  For individual provider: See AShea Evans

## 2016-04-26 NOTE — NC FL2 (Signed)
[place] LEVEL OF CARE SCREENING TOOL     IDENTIFICATION  Patient Name: Sherry [name] Birthdate: 07-08-1927 Sex: female Admission Date (Current Location): 04/24/2016  [place] and [place] Number:  Engineering geologist and Address:  [facility], [address] [place], [place] [address]      Provider Number: [id number]  Attending Physician Name and Address:  Max Sane, MD  Relative Name and Phone Number:       Current Level of Care: Hospital Recommended Level of Care: [place] Prior Approval Number:    Date Approved/Denied:   PASRR Number: [id number]  Discharge Plan: SNF    Current Diagnoses: Patient Active Problem List   Diagnosis Date Noted  . Gastroenteritis 04/24/2016  . Aphasia 04/18/2016  . Nausea & vomiting 03/27/2016  . Advance care planning   . UTI (urinary tract infection) 03/12/2016  . Chest pain 03/09/2016  . PE (pulmonary thromboembolism) ([place]) 02/26/2016  . Diabetes mellitus, type 2 ([place]) 02/26/2016  . Anemia 02/26/2016  . Chronic kidney disease (CKD), stage III (moderate) 02/26/2016  . Essential hypertension 02/26/2016  . Goals of care, counseling/discussion   . Adult failure to thrive syndrome   . Palliative care by specialist   . Sepsis ([place]) 11/26/2014    Orientation RESPIRATION BLADDER Height & Weight     Self, Time, Situation, Place  Normal Continent Weight: 143 lb (64.9 kg) Height:  5\' 4"  (162.6 cm)  BEHAVIORAL SYMPTOMS/MOOD NEUROLOGICAL BOWEL NUTRITION STATUS      Incontinent Diet (Soft Diet, Thin Liquids)  AMBULATORY STATUS COMMUNICATION OF NEEDS Skin   Extensive Assist Verbally Normal                       Personal Care Assistance Level of Assistance  Bathing, Feeding, Dressing Bathing Assistance: Limited assistance Feeding assistance: Independent Dressing Assistance: Limited assistance     Functional Limitations Info  Sight, Hearing, Speech Sight Info:  Adequate Hearing Info: Adequate Speech Info: Adequate    SPECIAL CARE FACTORS FREQUENCY  PT (By licensed PT), OT (By licensed OT)     PT Frequency: 5 OT Frequency: 5            Contractures Contractures Info: Not present    Additional Factors Info  Code Status, Allergies, Isolation Precautions Code Status Info: Full Code Allergies Info: No Known Allergies     Isolation Precautions Info: Enteric Precautions     Current Medications (04/26/2016):  This is the current hospital active medication list Current Facility-Administered Medications  Medication Dose Route Frequency Provider Last Rate Last Dose  . 0.9 %  sodium chloride infusion   Intravenous Continuous Loletha Grayer, MD 50 mL/hr at 04/26/16 [phone]    . acetaminophen (TYLENOL) tablet 650 mg  650 mg Oral Q6H PRN Loletha Grayer, MD       Or  . acetaminophen (TYLENOL) suppository 650 mg  650 mg Rectal Q6H PRN Loletha Grayer, MD      . acidophilus (RISAQUAD) capsule 1 capsule  1 capsule Oral Daily Loletha Grayer, MD   1 capsule at 04/26/16 0839  . amLODipine (NORVASC) tablet 5 mg  5 mg Oral Daily Loletha Grayer, MD   5 mg at 04/26/16 0839  . apixaban (ELIQUIS) tablet 5 mg  5 mg Oral BID Loletha Grayer, MD   5 mg at 04/26/16 0839  . atorvastatin (LIPITOR) tablet 20 mg  20 mg Oral QHS Loletha Grayer, MD   20 mg at 04/25/16 2026  .  cefTRIAXone (ROCEPHIN) 1 g in dextrose 5 % 50 mL IVPB  1 g Intravenous Q24H Loletha Grayer, MD   1 g at 04/25/16 2021  . gabapentin (NEURONTIN) capsule 300 mg  300 mg Oral QHS Loletha Grayer, MD   300 mg at 04/25/16 2027  . lisinopril (PRINIVIL,ZESTRIL) tablet 20 mg  20 mg Oral Daily Lenis Noon, RPH   20 mg at 04/26/16 3709   And  . hydrochlorothiazide (MICROZIDE) capsule 12.5 mg  12.5 mg Oral Daily Lenis Noon, RPH   12.5 mg at 04/26/16 0840  . insulin aspart (novoLOG) injection 0-5 Units  0-5 Units Subcutaneous QHS Loletha Grayer, MD      . insulin aspart (novoLOG) injection 0-9 Units   0-9 Units Subcutaneous TID WC Loletha Grayer, MD   2 Units at 04/26/16 1246  . ondansetron (ZOFRAN) injection 4 mg  4 mg Intravenous Q6H PRN Loletha Grayer, MD   4 mg at 04/26/16 0906  . pantoprazole (PROTONIX) injection 40 mg  40 mg Intravenous Q12H Loletha Grayer, MD   40 mg at 04/26/16 0839  . tamsulosin (FLOMAX) capsule 0.4 mg  0.4 mg Oral Daily Loletha Grayer, MD   0.4 mg at 04/26/16 0840     Discharge Medications: Please see discharge summary for a list of discharge medications.  Relevant Imaging Results:  Relevant Lab Results:   Additional Information SSN:  [id number]  Darden Dates, LCSW

## 2016-04-26 NOTE — Progress Notes (Signed)
MEDICATION RELATED CONSULT NOTE - INITIAL   Pharmacy Consult for electrolyte monitoring and replacement Indication: hypokalemia  No Known Allergies  Patient Measurements: Height: 5\' 4"  (162.6 cm) Weight: 143 lb (64.9 kg) IBW/kg (Calculated) : 54.7  Vital Signs: Temp: 98.9 F (37.2 C) (04/18 0811) Temp Source: Oral (04/18 0811) BP: 148/49 (04/18 0811) Pulse Rate: 82 (04/18 0811) Intake/Output from previous day: 04/17 0701 - 04/18 0700 In: 2544.7 [P.O.:240; I.V.:2254.7; IV Piggyback:50] Out: 625 [Urine:625] Intake/Output from this shift: No intake/output data recorded.  Labs:  Recent Labs  04/24/16 1339 04/25/16 0513 04/26/16 0728  WBC 9.8 11.3* 5.3  HGB 11.8* 9.3* 9.2*  HCT 36.1 28.5* 28.4*  PLT 370 351 307  CREATININE 0.89 0.89 0.89  MG 1.6* 2.1  --   ALBUMIN 2.7*  --   --   PROT 6.2*  --   --   AST 17  --   --   ALT 9*  --   --   ALKPHOS 68  --   --   BILITOT 0.5  --   --    Estimated Creatinine Clearance: 39.2 mL/min (by C-G formula based on SCr of 0.89 mg/dL).   Medical History: Past Medical History:  Diagnosis Date  . Diabetes mellitus without complication ([place])   . Hyperlipidemia   . Hypertension   . Pulmonary embolism [facility])    Assessment: Pharmacy consulted to monitor and replace electrolytes as needed in this 83 year old female admitted with gastroenteritis.   Goal of Therapy: Electrolytes WNL  Plan:  K = 3.2 this morning. Ordered KCl 40 mEq PO this morning and will recheck Mg/K at 1800.  Lenis Noon, PharmD, BCPS Clinical Pharmacist 04/26/2016,9:20 AM

## 2016-04-26 NOTE — Progress Notes (Signed)
[place] at [place] NAME: Sherry [name]    MR#:  [id number]  DATE OF BIRTH:  02-Apr-1927  SUBJECTIVE:  CHIEF COMPLAINT:   Chief Complaint  Patient presents with  . Abdominal Pain  Feels very weak, now she is feeling sick to stomach and having dry heaves, she is feeling that she may vomit, does not feel she can go today REVIEW OF SYSTEMS:    Review of Systems  Constitutional: Positive for malaise/fatigue. Negative for chills and fever.  HENT: Negative for sore throat.   Eyes: Negative for blurred vision, double vision and pain.  Respiratory: Negative for cough, hemoptysis, shortness of breath and wheezing.   Cardiovascular: Negative for chest pain, palpitations, orthopnea and leg swelling.  Gastrointestinal: Negative for abdominal pain, constipation, diarrhea, heartburn, nausea and vomiting.  Genitourinary: Negative for dysuria and hematuria.  Musculoskeletal: Negative for back pain and joint pain.  Skin: Negative for rash.  Neurological: Positive for weakness. Negative for sensory change, speech change, focal weakness and headaches.  Endo/Heme/Allergies: Does not bruise/bleed easily.  Psychiatric/Behavioral: Negative for depression. The patient is not nervous/anxious.    DRUG ALLERGIES:  No Known Allergies  VITALS:  Blood pressure (!) 148/49, pulse 82, temperature 98.9 F (37.2 C), temperature source Oral, resp. rate 20, height 5\' 4"  (1.626 m), weight 64.9 kg (143 lb), SpO2 93 %.  PHYSICAL EXAMINATION:   Physical Exam  GENERAL:  83 y.o.-year-old patient lying in the bed with no acute distress.  EYES: Pupils equal, round, reactive to light and accommodation. No scleral icterus. Extraocular muscles intact.  HEENT: Head atraumatic, normocephalic. Oropharynx and nasopharynx clear.  NECK:  Supple, no jugular venous distention. No thyroid enlargement, no tenderness.  LUNGS: Normal breath sounds bilaterally, no wheezing, rales, rhonchi. No  use of accessory muscles of respiration.  CARDIOVASCULAR: S1, S2 normal. No murmurs, rubs, or gallops.  ABDOMEN: Soft. No tenderness. Foley catheter in place EXTREMITIES: No cyanosis, clubbing or edema b/l.    NEUROLOGIC: Cranial nerves II through XII are intact. No focal Motor or sensory deficits b/l. tremulous PSYCHIATRIC: The patient is alert and oriented SKIN: No obvious rash, lesion, or ulcer.   LABORATORY PANEL:   CBC  Recent Labs Lab 04/26/16 0728  WBC 5.3  HGB 9.2*  HCT 28.4*  PLT 307   ------------------------------------------------------------------------------------------------------------------ Chemistries   Recent Labs Lab 04/24/16 1339 04/25/16 0513  04/26/16 0728  NA 137 142  --  137  K 3.5 2.5*  < > 3.2*  CL 98* 106  --  106  CO2 28 30  --  27  GLUCOSE 186* 38*  --  144*  BUN 11 12  --  9  CREATININE 0.89 0.89  --  0.89  CALCIUM 8.5* 8.0*  --  7.8*  MG 1.6* 2.1  --   --   AST 17  --   --   --   ALT 9*  --   --   --   ALKPHOS 68  --   --   --   BILITOT 0.5  --   --   --   < > = values in this interval not displayed. ------------------------------------------------------------------------------------------------------------------  Cardiac Enzymes No results for input(s): TROPONINI in the last 168 hours. ------------------------------------------------------------------------------------------------------------------  RADIOLOGY:  No results found.  ASSESSMENT AND PLAN:  83 y.o. female admitted for nausea vomiting and diarrhea  * Hypoglycemia with IDDM - Due to poor oral intake - Blood sugar was in 30s yesterday morning  which has now improved  * Acute gastroenteritis: pending stool studies as she's had no BM while inpt. Could be viral in nature. Since the patient was recently on antibiotics it could also be C. difficile. Monitor for right now supportive care with nausea medications and IV fluids.  - Advance diet from clear liquid to soft and see  if she tolerates  * Hypokalemia: replete and recheck, pharmacy managing this  * Chronic Atrial flutter: rate controlled   * Hypomagnesemia. Replaced  * Chronic Foley catheter. Change Foley catheter since urine analysis still positive. Patient was on Keflex for urinary tract infection from last admission. Changed to Rocephin since the patient is having nausea vomiting.  * History of PE on anticoagulation with eliquis  * Essential hypertension on Norvasc and hold hydrochlorothiazide and lisinopril    All the records are reviewed and case discussed with Care Management/Social Worker Management plans discussed with the patient, nursing and they are in agreement.  CODE STATUS: FULL CODE, maybe palliative care as an outpatient  DVT Prophylaxis: SCDs  TOTAL CC TIME TAKING CARE OF THIS PATIENT: 25 minutes.   POSSIBLE D/C IN 1-2 DAYS, DEPENDING ON CLINICAL CONDITION.  Max Sane M.D on 04/26/2016 at 4:18 PM  Between 7am to 6pm - Pager - [phone]  After 6pm go to [email or web address] - password [email or web address] [facility]  Office  [phone]  CC: Primary care physician; [facility]  Note: This dictation was prepared with Dragon dictation along with smaller phrase technology. Any transcriptional errors that result from this process are unintentional.

## 2016-04-27 ENCOUNTER — Ambulatory Visit: Payer: Medicare Other

## 2016-04-27 DIAGNOSIS — I4892 Unspecified atrial flutter: Secondary | ICD-10-CM

## 2016-04-27 LAB — GASTROINTESTINAL PANEL BY PCR, STOOL (REPLACES STOOL CULTURE)
ADENOVIRUS F40/41: NOT DETECTED
ASTROVIRUS: NOT DETECTED
CAMPYLOBACTER SPECIES: NOT DETECTED
CYCLOSPORA CAYETANENSIS: NOT DETECTED
Cryptosporidium: NOT DETECTED
ENTEROAGGREGATIVE E COLI (EAEC): NOT DETECTED
ENTEROPATHOGENIC E COLI (EPEC): NOT DETECTED
Entamoeba histolytica: NOT DETECTED
Enterotoxigenic E coli (ETEC): NOT DETECTED
Giardia lamblia: NOT DETECTED
Norovirus GI/GII: NOT DETECTED
PLESIMONAS SHIGELLOIDES: NOT DETECTED
ROTAVIRUS A: NOT DETECTED
SALMONELLA SPECIES: NOT DETECTED
Sapovirus (I, II, IV, and V): NOT DETECTED
Shiga like toxin producing E coli (STEC): NOT DETECTED
Shigella/Enteroinvasive E coli (EIEC): NOT DETECTED
VIBRIO CHOLERAE: NOT DETECTED
Vibrio species: NOT DETECTED
Yersinia enterocolitica: NOT DETECTED

## 2016-04-27 LAB — BASIC METABOLIC PANEL
ANION GAP: 5 (ref 5–15)
BUN: 7 mg/dL (ref 6–20)
CHLORIDE: 102 mmol/L (ref 101–111)
CO2: 26 mmol/L (ref 22–32)
Calcium: 7.6 mg/dL — ABNORMAL LOW (ref 8.9–10.3)
Creatinine, Ser: 0.85 mg/dL (ref 0.44–1.00)
GFR calc non Af Amer: >60 mL/min (ref >60)
Glucose, Bld: 165 mg/dL — ABNORMAL HIGH (ref 65–99)
Potassium: 3.5 mmol/L (ref 3.5–5.1)
Sodium: 133 mmol/L — ABNORMAL LOW (ref 135–145)

## 2016-04-27 LAB — GLUCOSE, CAPILLARY
GLUCOSE-CAPILLARY: 167 mg/dL — AB (ref 65–99)
Glucose-Capillary: 160 mg/dL — ABNORMAL HIGH (ref 65–99)

## 2016-04-27 LAB — MAGNESIUM: Magnesium: 1.9 mg/dL (ref 1.7–2.4)

## 2016-04-27 MED ORDER — MAGNESIUM OXIDE 400 (241.3 MG) MG PO TABS: 400.0000 mg | ORAL_TABLET | Freq: Every day | ORAL | 0 refills | Status: DC

## 2016-04-27 MED ORDER — POLYETHYLENE GLYCOL 3350 17 G PO PACK: 17.0000 g | PACK | Freq: Every day | ORAL | 0 refills | Status: DC | PRN

## 2016-04-27 MED ORDER — FLEET ENEMA 7-19 GM/118ML RE ENEM
1.0000 | ENEMA | Freq: Once | RECTAL | Status: AC
  Administered 2016-04-27: 11:00:00 1 via RECTAL

## 2016-04-27 MED ORDER — POTASSIUM CHLORIDE CRYS ER 10 MEQ PO TBCR: 10.0000 meq | EXTENDED_RELEASE_TABLET | Freq: Every day | ORAL | 0 refills | Status: DC

## 2016-04-27 MED ORDER — BISACODYL 10 MG RE SUPP: 10.0000 mg | Freq: Every day | RECTAL | Status: DC | PRN

## 2016-04-27 MED ORDER — INSULIN DETEMIR 100 UNIT/ML FLEXPEN: 8.0000 [IU] | PEN_INJECTOR | Freq: Every morning | SUBCUTANEOUS | 0 refills | Status: DC

## 2016-04-27 MED ORDER — PANTOPRAZOLE SODIUM 40 MG PO TBEC: 40.0000 mg | DELAYED_RELEASE_TABLET | Freq: Two times a day (BID) | ORAL | 0 refills | Status: DC

## 2016-04-27 MED ORDER — POTASSIUM CHLORIDE CRYS ER 20 MEQ PO TBCR
40.0000 meq | EXTENDED_RELEASE_TABLET | Freq: Once | ORAL | Status: AC
  Administered 2016-04-27: 40 meq via ORAL
  Filled 2016-04-27: qty 2

## 2016-04-27 MED ORDER — MAGNESIUM OXIDE 400 (241.3 MG) MG PO TABS
400.0000 mg | ORAL_TABLET | Freq: Every day | ORAL | Status: DC
  Administered 2016-04-27: 11:00:00 400 mg via ORAL
  Filled 2016-04-27: qty 1

## 2016-04-27 NOTE — Clinical Social Work Note (Signed)
Pt is ready for discharge today and will return to [facility]. Pt is aware and agreeable to discharge plan. CSW left a message with pt's daughter. Facility is ready to admit pt as they have received discharge information. RN to call report. [facility] to provide transportation. CSW is signing off as no further needs.   Darden Dates, MSW, LCSW  Clinical Social Worker  [phone]

## 2016-04-27 NOTE — Progress Notes (Signed)
New referral for Palliative services to follow at [facility] received from [place]. Referral made aware. Thank you. Flo Shanks RN, BSN, [facility] [place], hospital liaison [phone]

## 2016-04-27 NOTE — Progress Notes (Signed)
Pt is being discharged to [facility]. Pt is being discharged with foley. Report given to [facility], LPN. Discharge papers given and explained to pt. Pt verbalized understanding. Additional copy of AVS placed in discharge packet. Meds and f/u appointments reviewed with pt. Awaiting EMS.

## 2016-04-27 NOTE — Discharge Summary (Signed)
[place] at [place] NAME: Sherry [name]    MR#:  [id number]  DATE OF BIRTH:  Jan 17, 1927  DATE OF ADMISSION:  04/24/2016 ADMITTING PHYSICIAN: Loletha Grayer, MD  DATE OF DISCHARGE: 04/27/2016  PRIMARY CARE PHYSICIAN: Dr Joaquin Courts   ADMISSION DIAGNOSIS:  Nausea vomiting and diarrhea [R11.2, R19.7] Atrial flutter, unspecified type ([place]) [I48.92]  DISCHARGE DIAGNOSIS:  Active Problems:   Gastroenteritis   Atrial flutter ([place])   SECONDARY DIAGNOSIS:   Past Medical History:  Diagnosis Date  . Diabetes mellitus without complication ([place])   . Hyperlipidemia   . Hypertension   . Pulmonary embolism ([place])     HOSPITAL COURSE:   1.  Acute gastroenteritis. Patient feeling better today than when she came in. In reviewing previous CT scan she did have inflammation of the distal esophagus. Her Protonix was given IV while here. We'll switch over to oral twice a day at facility. The patient was able to tolerate diet this morning and felt better. The patient had no bowel movements while here in the hospital, so stool studies were not sent. We'll give the patient an enema and/or suppository prior to disposition. 2. Hypokalemia and hypomagnesemia. Oral supplementation will be given. Recheck one week. If these electrolytes are still low consider discontinuing hydrochlorothiazide. 3. Atrial flutter. On Eliquis.  4. Hypoglycemia with insulin-dependent diabetes. Patient was put on sliding scale. Since the patient's oral intake is better. Can consider restarting the Lantus 8 units subcutaneous injection daily but make sure she eats breakfast. Sliding scale insulin 5. Chronic Foley catheter with recent urinary tract infection. This urine culture is growing yeast which I do not treat. Change Foley catheter. I did give Rocephin here in the hospital for previous urinary tract infection will stop antibiotics. 6. Essential hypertension continue usual medications 7.  History of constipation. MiraLAX daily when necessary  8. Numerous hospitalizations. Palliative care consultation appreciated. Palliative care to follow at the facility. Patient full code at this point. 9. Anemia of chronic disease 10. Physical therapy to work with the patient at facility   DISCHARGE CONDITIONS:   Satisfactory  CONSULTS OBTAINED:  None  DRUG ALLERGIES:  No Known Allergies  DISCHARGE MEDICATIONS:   Current Discharge Medication List    START taking these medications   Details  magnesium oxide (MAG-OX) 400 (241.3 Mg) MG tablet Take 1 tablet (400 mg total) by mouth daily. Qty: 30 tablet, Refills: 0    polyethylene glycol (MIRALAX) packet Take 17 g by mouth daily as needed. Qty: 30 each, Refills: 0    potassium chloride SA (K-DUR,KLOR-CON) 10 MEQ tablet Take 1 tablet (10 mEq total) by mouth daily. Qty: 30 tablet, Refills: 0      CONTINUE these medications which have CHANGED   Details  Insulin Detemir (LEVEMIR) 100 UNIT/ML Pen Inject 8 Units into the skin every morning. Qty: 15 mL, Refills: 0    pantoprazole (PROTONIX) 40 MG tablet Take 1 tablet (40 mg total) by mouth 2 (two) times daily. Qty: 60 tablet, Refills: 0      CONTINUE these medications which have NOT CHANGED   Details  acidophilus (RISAQUAD) CAPS capsule Take 1 capsule by mouth daily.    apixaban (ELIQUIS) 5 MG TABS tablet Take 1 tablet (5 mg total) by mouth 2 (two) times daily. Qty: 60 tablet, Refills: 3    guaiFENesin (MUCINEX) 600 MG 12 hr tablet Take by mouth 2 (two) times daily.    insulin lispro (HUMALOG) 100 UNIT/ML injection Inject  into the skin 3 (three) times daily before meals. Sliding scale insulin. 0-99=0 units 100-149=2 units 150-199=3 units 200-249=4 units 250-299=5 units 300-349=6 units 350-399=7 units 400-449=8 units 450-499=9 units    ondansetron (ZOFRAN) 4 MG tablet Take 4 mg by mouth 2 (two) times daily. *May take every 8 hours as needed for nausea and vomiting     promethazine (PHENERGAN) 25 MG/ML injection Inject into the vein once.    tamsulosin (FLOMAX) 0.4 MG CAPS capsule Take 1 capsule (0.4 mg total) by mouth daily. Qty: 30 capsule, Refills: 0    acetaminophen (TYLENOL) 325 MG tablet Take 650 mg by mouth every 4 (four) hours as needed for mild pain or moderate pain.    amLODipine (NORVASC) 5 MG tablet Take 5 mg by mouth daily.    atorvastatin (LIPITOR) 20 MG tablet Take 1 tablet by mouth at bedtime.    gabapentin (NEURONTIN) 300 MG capsule Take 1 capsule by mouth at bedtime.    lisinopril-hydrochlorothiazide (PRINZIDE,ZESTORETIC) 20-12.5 MG tablet Take 2 tablets by mouth daily.       STOP taking these medications     cephALEXin (KEFLEX) 500 MG capsule          DISCHARGE INSTRUCTIONS:   Follow up with doctor at facility  If you experience worsening of your admission symptoms, develop shortness of breath, life threatening emergency, suicidal or homicidal thoughts you must seek medical attention immediately by calling 911 or calling your MD immediately  if symptoms less severe.  You Must read complete instructions/literature along with all the possible adverse reactions/side effects for all the Medicines you take and that have been prescribed to you. Take any new Medicines after you have completely understood and accept all the possible adverse reactions/side effects.   Please note  You were cared for by a hospitalist during your hospital stay. If you have any questions about your discharge medications or the care you received while you were in the hospital after you are discharged, you can call the unit and asked to speak with the hospitalist on call if the hospitalist that took care of you is not available. Once you are discharged, your primary care physician will handle any further medical issues. Please note that NO REFILLS for any discharge medications will be authorized once you are discharged, as it is imperative that you return to  your primary care physician (or establish a relationship with a primary care physician if you do not have one) for your aftercare needs so that they can reassess your need for medications and monitor your lab values.    Today   CHIEF COMPLAINT:   Chief Complaint  Patient presents with  . Abdominal Pain    HISTORY OF PRESENT ILLNESS:  Keshauna Degraffenreid  is a 83 y.o. female resented with abdominal pain nausea vomiting and diarrhea   VITAL SIGNS:  Blood pressure (!) 161/53, pulse 78, temperature 98 F (36.7 C), temperature source Oral, resp. rate 18, height 5\' 4"  (1.626 m), weight 64.9 kg (143 lb), SpO2 93 %.   PHYSICAL EXAMINATION:  GENERAL:  83 y.o.-year-old patient lying in the bed with no acute distress.  EYES: Pupils equal, round, reactive to light and accommodation. No scleral icterus. Extraocular muscles intact.  HEENT: Head atraumatic, normocephalic. Oropharynx and nasopharynx clear.  NECK:  Supple, no jugular venous distention. No thyroid enlargement, no tenderness.  LUNGS: Normal breath sounds bilaterally, no wheezing, rales,rhonchi or crepitation. No use of accessory muscles of respiration.  CARDIOVASCULAR: S1, S2 normal. No murmurs,  rubs, or gallops.  ABDOMEN: Soft, non-tender, non-distended. Bowel sounds present. No organomegaly or mass.  EXTREMITIES: Trace  edema,  no cyanosis, or clubbing.  NEUROLOGIC: Cranial nerves II through XII are intact. Muscle strength 5/5 in all extremities. Sensation intact. Gait not checked.  PSYCHIATRIC: The patient is alert and oriented x 3.  SKIN: No obvious rash, lesion, or ulcer.   DATA REVIEW:   CBC  Recent Labs Lab 04/26/16 0728  WBC 5.3  HGB 9.2*  HCT 28.4*  PLT 307    Chemistries   Recent Labs Lab 04/24/16 1339  04/27/16 0625  NA 137  < > 133*  K 3.5  < > 3.5  CL 98*  < > 102  CO2 28  < > 26  GLUCOSE 186*  < > 165*  BUN 11  < > 7  CREATININE 0.89  < > 0.85  CALCIUM 8.5*  < > 7.6*  MG 1.6*  < > 1.9  AST 17  --   --    ALT 9*  --   --   ALKPHOS 68  --   --   BILITOT 0.5  --   --   < > = values in this interval not displayed.   Microbiology Results  Results for orders placed or performed during the hospital encounter of 04/24/16  Urine culture     Status: Abnormal   Collection Time: 04/24/16  1:39 PM  Result Value Ref Range Status   Specimen Description URINE, RANDOM  Final   Special Requests NONE  Final   Culture >=100,000 COLONIES/mL YEAST (A)  Final   Report Status 04/25/2016 FINAL  Final     Management plans discussed with the patient, And she is in agreement.I left message for the daughter  CODE STATUS:     Code Status Orders        Start     Ordered   04/24/16 1928  Full code  Continuous     04/24/16 1927    Code Status History    Date Active Date Inactive Code Status Order ID Comments User Context   04/19/2016 12:50 AM 04/22/2016 12:10 AM Full Code [id number]  Lance Coon, MD Inpatient   03/27/2016  9:39 PM 03/29/2016  9:41 PM Full Code [id number]  Fritzi Mandes, MD ED   03/12/2016 12:21 PM 03/13/2016  8:32 PM Full Code [id number]  Dustin Flock, MD Inpatient   03/09/2016  4:33 PM 03/12/2016 12:21 PM DNR [id number]  Nicholes Mango, MD ED   02/26/2016  9:39 PM 03/01/2016 10:37 PM DNR [id number]  Karmen Bongo, MD Inpatient   02/26/2016  7:48 PM 02/26/2016  9:39 PM DNR [id number]  Dorie Rank, MD ED   01/31/2016  5:45 PM 02/07/2016 11:45 PM DNR [id number]  Flora Lipps, MD Inpatient   01/30/2016 11:19 PM 01/31/2016  5:44 PM Full Code [id number]  Harvie Bridge, DO Inpatient   11/26/2014  3:52 PM 11/28/2014  3:07 PM Full Code [id number]  Max Sane, MD Inpatient   11/26/2014  8:59 AM 11/26/2014  3:52 PM Full Code [id number]  Max Sane, MD ED    Advance Directive Documentation     Most Recent Value  Type of Advance Directive  Living will  Pre-existing out of facility DNR order (yellow form or pink MOST form)  -  "MOST" Form in Place?  -      TOTAL TIME TAKING CARE OF THIS PATIENT: 35  minutes.     Loletha Grayer M.D on 04/27/2016 at 9:26  AM  Between 7am to 6pm - Pager - [phone]  After 6pm go to [email or web address] - password [email or web address]  [facility] [employer] Office  [phone]  CC: Primary care physician; Dr Alvester Morin

## 2016-04-27 NOTE — Progress Notes (Signed)
MEDICATION RELATED CONSULT NOTE - INITIAL   Pharmacy Consult for electrolyte monitoring and replacement Indication: hypokalemia and hypomagnesemia  No Known Allergies  Patient Measurements: Height: 5\' 4"  (162.6 cm) Weight: 143 lb (64.9 kg) IBW/kg (Calculated) : 54.7  Vital Signs: Temp: 98 F (36.7 C) (04/19 0621) Temp Source: Oral (04/19 0621) BP: 161/53 (04/19 7482) Pulse Rate: 78 (04/19 0621) Intake/Output from previous day: 04/18 0701 - 04/19 0700 In: 2047 [P.O.:120; I.V.:1877; IV Piggyback:50] Out: 700 [Urine:700] Intake/Output from this shift: No intake/output data recorded.  Labs:  Recent Labs  04/24/16 1339 04/25/16 0513 04/26/16 0728 04/26/16 1825 04/27/16 0625  WBC 9.8 11.3* 5.3  --   --   HGB 11.8* 9.3* 9.2*  --   --   HCT 36.1 28.5* 28.4*  --   --   PLT 370 351 307  --   --   CREATININE 0.89 0.89 0.89  --  0.85  MG 1.6* 2.1  --  1.6* 1.9  ALBUMIN 2.7*  --   --   --   --   PROT 6.2*  --   --   --   --   AST 17  --   --   --   --   ALT 9*  --   --   --   --   ALKPHOS 68  --   --   --   --   BILITOT 0.5  --   --   --   --    Estimated Creatinine Clearance: 41 mL/min (by C-G formula based on SCr of 0.85 mg/dL).   Medical History: Past Medical History:  Diagnosis Date  . Diabetes mellitus without complication ([place])   . Hyperlipidemia   . Hypertension   . Pulmonary embolism [facility])    Assessment: Pharmacy consulted to monitor and replace electrolytes as needed in this 83 year old female admitted with gastroenteritis.   K= 3.5 Mg = 1.9  Goal of Therapy: Electrolytes WNL  Plan:  Electrolytes WNL this morning - no supplementation needed. Will recheck electrolytes with AM labs tomorrow and sign off if within normal limits again.  Lenis Noon, PharmD Clinical Pharmacist 04/27/2016 7:21 AM

## 2016-05-05 ENCOUNTER — Ambulatory Visit: Payer: Medicare Other | Admitting: Urology

## 2016-05-24 ENCOUNTER — Ambulatory Visit: Payer: Medicare Other | Admitting: Urology

## 2016-05-27 ENCOUNTER — Encounter (HOSPITAL_COMMUNITY): Payer: Self-pay | Admitting: Emergency Medicine

## 2016-05-27 ENCOUNTER — Emergency Department (HOSPITAL_COMMUNITY)
Admission: EM | Admit: 2016-05-27 | Discharge: 2016-05-27 | Disposition: A | Discharge status: Home / Self Care | Payer: Medicare Other | Attending: Emergency Medicine | Admitting: Emergency Medicine

## 2016-05-27 DIAGNOSIS — Z794 Long term (current) use of insulin: Secondary | ICD-10-CM | POA: Insufficient documentation

## 2016-05-27 DIAGNOSIS — E1122 Type 2 diabetes mellitus with diabetic chronic kidney disease: Secondary | ICD-10-CM | POA: Insufficient documentation

## 2016-05-27 DIAGNOSIS — I129 Hypertensive chronic kidney disease with stage 1 through stage 4 chronic kidney disease, or unspecified chronic kidney disease: Secondary | ICD-10-CM | POA: Insufficient documentation

## 2016-05-27 DIAGNOSIS — Z79899 Other long term (current) drug therapy: Secondary | ICD-10-CM | POA: Insufficient documentation

## 2016-05-27 DIAGNOSIS — Z7901 Long term (current) use of anticoagulants: Secondary | ICD-10-CM | POA: Insufficient documentation

## 2016-05-27 DIAGNOSIS — N183 Chronic kidney disease, stage 3 (moderate): Secondary | ICD-10-CM | POA: Insufficient documentation

## 2016-05-27 DIAGNOSIS — R1310 Dysphagia, unspecified: Secondary | ICD-10-CM

## 2016-05-27 LAB — BASIC METABOLIC PANEL
Anion gap: 10 (ref 5–15)
BUN: 16 mg/dL (ref 6–20)
CHLORIDE: 97 mmol/L — AB (ref 101–111)
CO2: 28 mmol/L (ref 22–32)
CREATININE: 0.99 mg/dL (ref 0.44–1.00)
Calcium: 9.1 mg/dL (ref 8.9–10.3)
GFR calc Af Amer: 58 mL/min — ABNORMAL LOW (ref >60)
GFR calc non Af Amer: 50 mL/min — ABNORMAL LOW (ref >60)
Glucose, Bld: 220 mg/dL — ABNORMAL HIGH (ref 65–99)
Potassium: 3.6 mmol/L (ref 3.5–5.1)
Sodium: 135 mmol/L (ref 135–145)

## 2016-05-27 LAB — CBC WITH DIFFERENTIAL/PLATELET
Basophils Absolute: 0 10*3/uL (ref 0.0–0.1)
Basophils Relative: 0 %
Eosinophils Absolute: 0 10*3/uL (ref 0.0–0.7)
Eosinophils Relative: 0 %
HEMATOCRIT: 38.9 % (ref 36.0–46.0)
HEMOGLOBIN: 12.5 g/dL (ref 12.0–15.0)
Lymphocytes Relative: 16 %
Lymphs Abs: 1.4 10*3/uL (ref 0.7–4.0)
MCH: 27.3 pg (ref 26.0–34.0)
MCHC: 32.1 g/dL (ref 30.0–36.0)
MCV: 84.9 fL (ref 78.0–100.0)
MONOS PCT: 5 %
Monocytes Absolute: 0.4 10*3/uL (ref 0.1–1.0)
NEUTROS ABS: 6.6 10*3/uL (ref 1.7–7.7)
NEUTROS PCT: 79 %
Platelets: 269 10*3/uL (ref 150–400)
RBC: 4.58 MIL/uL (ref 3.87–5.11)
RDW: 15 % (ref 11.5–15.5)
WBC: 8.4 10*3/uL (ref 4.0–10.5)

## 2016-05-27 MED ORDER — SODIUM CHLORIDE 0.9 % IV BOLUS (SEPSIS)
500.0000 mL | Freq: Once | INTRAVENOUS | Status: AC
  Administered 2016-05-27: 500 mL via INTRAVENOUS

## 2016-05-27 MED ORDER — PANTOPRAZOLE SODIUM 20 MG PO TBEC: 20.0000 mg | DELAYED_RELEASE_TABLET | Freq: Two times a day (BID) | ORAL | 1 refills | Status: DC

## 2016-05-27 MED ORDER — SODIUM CHLORIDE 0.9 % IV SOLN
INTRAVENOUS | Status: DC
  Administered 2016-05-27: 14:00:00 via INTRAVENOUS

## 2016-05-27 NOTE — ED Notes (Signed)
Returned call received from Education officer, museum [phone] with social working noting she spoke with pt daughter and she is agreeing to allow pt to return to their home once her husband can get off work.  Social worker notes she gave pt family resuources for assisted living.  Pt daughter called at 45 noting her husband is now off work and on his way.

## 2016-05-27 NOTE — ED Triage Notes (Signed)
Pt here for choking sensation.  States this is chronic issue and was supposed to follow up with ENT, but cancelled her appt last week.

## 2016-05-27 NOTE — ED Notes (Signed)
Pt ready for discharge pt daughter phoned by off going nurse Meyer Russel with daughter stating she will be unable to allow pt to return to her home due to multiple steps at all entrances and she has been having to call EMS to assist pt in and out of home.  On call social worker notified.  Pt recently discharged from rehab facility and pt is addiment she does not want to go back to skilled nursing facility.  Pt states her son's house in [place] has no steps and she is willing to go stay with him.  Will await return call from Education officer, museum.

## 2016-05-27 NOTE — ED Provider Notes (Addendum)
[facility] DEPT Provider Note   CSN: [id number] Arrival date & time: 05/27/16  1148     History   Chief Complaint Chief Complaint  Patient presents with  . Choking    chronic    HPI Sherry [name] is a 83 y.o. female.  Patient brought in by EMS. Patient states that she been having difficulty swallowing liquids or food for several months. Patient does not appear sniffily dehydrated. Patient states that she is able to swallow her saliva fine. Patient without any other specific complaints. Patient has been home with her daughter now for 2 weeks. Was in a nursing facility in the [place] area prior to that.      Past Medical History:  Diagnosis Date  . Diabetes mellitus without complication ([place])   . Hyperlipidemia   . Hypertension   . Pulmonary embolism [facility])     Patient Active Problem List   Diagnosis Date Noted  . Atrial flutter ([place])   . Gastroenteritis 04/24/2016  . Aphasia 04/18/2016  . Nausea & vomiting 03/27/2016  . Advance care planning   . UTI (urinary tract infection) 03/12/2016  . Chest pain 03/09/2016  . PE (pulmonary thromboembolism) ([place]) 02/26/2016  . Diabetes mellitus, type 2 ([place]) 02/26/2016  . Anemia 02/26/2016  . Chronic kidney disease (CKD), stage III (moderate) 02/26/2016  . Essential hypertension 02/26/2016  . Goals of care, counseling/discussion   . Adult failure to thrive syndrome   . Palliative care by specialist   . Sepsis ([place]) 11/26/2014    Past Surgical History:  Procedure Laterality Date  . APPENDECTOMY    . BACK SURGERY    . SHOULDER SURGERY      OB History    No data available       Home Medications    Prior to Admission medications   Medication Sig Start Date End Date Taking? Authorizing Provider  acetaminophen (TYLENOL) 325 MG tablet Take 650 mg by mouth every 4 (four) hours as needed for mild pain or moderate pain.    [provider]  acidophilus (RISAQUAD) CAPS capsule Take 1 capsule by mouth daily.     [provider]  amLODipine (NORVASC) 5 MG tablet Take 5 mg by mouth daily.    [provider]  apixaban (ELIQUIS) 5 MG TABS tablet Take 1 tablet (5 mg total) by mouth 2 (two) times daily. 03/08/16   Isaac Bliss, Rayford Halsted, MD  atorvastatin (LIPITOR) 20 MG tablet Take 1 tablet by mouth at bedtime.    [provider]  gabapentin (NEURONTIN) 300 MG capsule Take 1 capsule by mouth at bedtime.    [provider]  guaiFENesin (MUCINEX) 600 MG 12 hr tablet Take by mouth 2 (two) times daily.    [provider]  Insulin Detemir (LEVEMIR) 100 UNIT/ML Pen Inject 8 Units into the skin every morning. 04/27/16   Loletha Grayer, MD  insulin lispro (HUMALOG) 100 UNIT/ML injection Inject into the skin 3 (three) times daily before meals. Sliding scale insulin. 0-99=0 units 100-149=2 units 150-199=3 units 200-249=4 units 250-299=5 units 300-349=6 units 350-399=7 units 400-449=8 units 450-499=9 units    [provider]  lisinopril-hydrochlorothiazide (PRINZIDE,ZESTORETIC) 20-12.5 MG tablet Take 2 tablets by mouth daily.     [provider]  magnesium oxide (MAG-OX) 400 (241.3 Mg) MG tablet Take 1 tablet (400 mg total) by mouth daily. 04/27/16   Loletha Grayer, MD  ondansetron (ZOFRAN) 4 MG tablet Take 4 mg by mouth 2 (two) times daily. *May take every  8 hours as needed for nausea and vomiting    [provider]  pantoprazole (PROTONIX) 20 MG tablet Take 1 tablet (20 mg total) by mouth 2 (two) times daily. 05/27/16   Fredia Sorrow, MD  pantoprazole (PROTONIX) 40 MG tablet Take 1 tablet (40 mg total) by mouth 2 (two) times daily. 04/27/16   Loletha Grayer, MD  polyethylene glycol [facility]) packet Take 17 g by mouth daily as needed. 04/27/16   Loletha Grayer, MD  potassium chloride SA (K-DUR,KLOR-CON) 10 MEQ tablet Take 1 tablet (10 mEq total) by mouth daily. 04/27/16   Loletha Grayer, MD  promethazine (PHENERGAN) 25 MG/ML injection  Inject into the vein once.    [provider]  tamsulosin (FLOMAX) 0.4 MG CAPS capsule Take 1 capsule (0.4 mg total) by mouth daily. 03/14/16   Fritzi Mandes, MD    Family History Family History  Problem Relation Age of Onset  . Diabetes Mother   . Cancer Mother   . Cancer Father   . Bladder Cancer Neg Hx   . Kidney cancer Neg Hx     Social History Social History  Substance Use Topics  . Smoking status: Never Smoker  . Smokeless tobacco: Never Used  . Alcohol use No     Allergies   Patient has no known allergies.   Review of Systems Review of Systems  Constitutional: Negative for fever.  HENT: Positive for trouble swallowing. Negative for congestion.   Eyes: Negative for redness.  Respiratory: Negative for shortness of breath.   Cardiovascular: Negative for chest pain.  Gastrointestinal: Negative for abdominal pain.  Genitourinary: Negative for dysuria.  Musculoskeletal: Negative for back pain and neck pain.  Neurological: Negative for light-headedness and headaches.  Hematological: Bruises/bleeds easily.  Psychiatric/Behavioral: Negative for confusion.     Physical Exam Updated Vital Signs BP (!) 171/70   Pulse 86   Temp 98 F (36.7 C) (Oral)   Resp 16   Ht 5\' 4"  (1.626 m)   Wt 143 lb (64.9 kg)   SpO2 96%   BMI 24.55 kg/m   Physical Exam  Constitutional: She is oriented to person, place, and time. She appears well-developed and well-nourished. No distress.  HENT:  Head: Normocephalic and atraumatic.  Mouth/Throat: Oropharynx is clear and moist. No oropharyngeal exudate.  Eyes: Conjunctivae and EOM are normal. Pupils are equal, round, and reactive to light.  Neck: Normal range of motion. Neck supple.  Cardiovascular: Normal rate, regular rhythm and normal heart sounds.   Pulmonary/Chest: Effort normal and breath sounds normal. No respiratory distress.  Abdominal: Soft. Bowel sounds are normal. There is no tenderness.  Musculoskeletal: Normal  range of motion. She exhibits no edema.  Neurological: She is alert and oriented to person, place, and time. No cranial nerve deficit or sensory deficit. She exhibits normal muscle tone. Coordination normal.  Skin: Skin is warm. Capillary refill takes less than 2 seconds.  Nursing note and vitals reviewed.    ED Treatments / Results  Labs (all labs ordered are listed, but only abnormal results are displayed) Labs Reviewed  BASIC METABOLIC PANEL - Abnormal; Notable for the following:       Result Value   Chloride 97 (*)    Glucose, Bld 220 (*)    GFR calc non Af Amer 50 (*)    GFR calc Af Amer 58 (*)    All other components within normal limits  CBC WITH DIFFERENTIAL/PLATELET    EKG  EKG Interpretation None  Radiology No results found.  Procedures Procedures (including critical care time)  Medications Ordered in ED Medications  0.9 %  sodium chloride infusion ( Intravenous New Bag/Given 05/27/16 1348)  sodium chloride 0.9 % bolus 500 mL (0 mLs Intravenous Stopped 05/27/16 1446)     Initial Impression / Assessment and Plan / ED Course  I have reviewed the triage vital signs and the nursing notes.  Pertinent labs & imaging results that were available during my care of the patient were reviewed by me and considered in my medical decision making (see chart for details).     [name] labs without any significant dehydration. Patient given some IV fluids here. Doubt patient has significant problem swallowing at least liquids because her BUN and creatinine are not particularly off. And a mucous membranes are moist.  Final Clinical Impressions(s) / ED Diagnoses   Final diagnoses:  Dysphagia, unspecified type    New Prescriptions New Prescriptions   PANTOPRAZOLE (PROTONIX) 20 MG TABLET    Take 1 tablet (20 mg total) by mouth 2 (two) times daily.     Fredia Sorrow, MD 05/27/16 1518  Addendum: Unknown to [place] when we contacted daughter to come pick her up  daughter refuses to take her home. Patient is under palliative care. So nurses are trying to contact palliative care. No clear indication for admission and we do not have a Education officer, museum here today. We'll turn the situation over to the evening emergency physician.    Fredia Sorrow, MD 05/27/16 [phone]

## 2016-05-27 NOTE — Clinical Social Work Note (Addendum)
CSW notified by [facility] in ED, pt medically ready to DC and family refusing to pick up pt.  CSW contacted pt's dtr to discuss. Dtr reports that she never said she wasn't picking up pt, dtr is waiting for her husband to get off work and they will pick pt up. Dtr did discuss pt being at her house for 4 days and reports that she has 14 stairs on 1 side and 16 stairs on the other side. Dtr reports that she will take pt to her niece's that has no stairs until she and family can figure out the best option for pt.  CSW discussed with dtr maybe considering a ALF for her mother and provided facilities in [place].  No other DC needs identified at this time.  CSW notified [facility] in ED of the families plan.  CSW is signing off.  Dawsyn Ramsaran B. Joline Maxcy Clinical Social Work Dept Weekend Social Worker [phone] 3:39 PM

## 2016-05-27 NOTE — Discharge Instructions (Signed)
Follow-up with the specialist provided above. Make an appointment to see GI medicine. Make an appointment to see neurology. Make an appointment to see her nose and throat. A batch or difficulty swallowing. Make sure you're taking your Protonix. Today's labs without any significant abnormalities.

## 2016-05-28 ENCOUNTER — Inpatient Hospital Stay (HOSPITAL_COMMUNITY)
Admission: EM | Admit: 2016-05-28 | Discharge: 2016-06-02 | DRG: 392 | Disposition: A | Discharge status: Home / Self Care | Payer: Medicare Other | Attending: Oncology | Admitting: Oncology

## 2016-05-28 ENCOUNTER — Emergency Department (HOSPITAL_COMMUNITY): Payer: Medicare Other

## 2016-05-28 ENCOUNTER — Encounter (HOSPITAL_COMMUNITY): Payer: Self-pay | Admitting: *Deleted

## 2016-05-28 DIAGNOSIS — K921 Melena: Secondary | ICD-10-CM | POA: Diagnosis present

## 2016-05-28 DIAGNOSIS — E1165 Type 2 diabetes mellitus with hyperglycemia: Secondary | ICD-10-CM | POA: Diagnosis present

## 2016-05-28 DIAGNOSIS — I4892 Unspecified atrial flutter: Secondary | ICD-10-CM | POA: Diagnosis present

## 2016-05-28 DIAGNOSIS — K209 Esophagitis, unspecified without bleeding: Secondary | ICD-10-CM | POA: Diagnosis present

## 2016-05-28 DIAGNOSIS — I5020 Unspecified systolic (congestive) heart failure: Secondary | ICD-10-CM | POA: Diagnosis present

## 2016-05-28 DIAGNOSIS — N3090 Cystitis, unspecified without hematuria: Secondary | ICD-10-CM | POA: Diagnosis present

## 2016-05-28 DIAGNOSIS — F329 Major depressive disorder, single episode, unspecified: Secondary | ICD-10-CM | POA: Diagnosis present

## 2016-05-28 DIAGNOSIS — Z7901 Long term (current) use of anticoagulants: Secondary | ICD-10-CM

## 2016-05-28 DIAGNOSIS — E876 Hypokalemia: Secondary | ICD-10-CM | POA: Diagnosis not present

## 2016-05-28 DIAGNOSIS — R339 Retention of urine, unspecified: Secondary | ICD-10-CM | POA: Diagnosis present

## 2016-05-28 DIAGNOSIS — K219 Gastro-esophageal reflux disease without esophagitis: Secondary | ICD-10-CM

## 2016-05-28 DIAGNOSIS — Z794 Long term (current) use of insulin: Secondary | ICD-10-CM

## 2016-05-28 DIAGNOSIS — N189 Chronic kidney disease, unspecified: Secondary | ICD-10-CM | POA: Diagnosis present

## 2016-05-28 DIAGNOSIS — D509 Iron deficiency anemia, unspecified: Secondary | ICD-10-CM | POA: Diagnosis present

## 2016-05-28 DIAGNOSIS — I130 Hypertensive heart and chronic kidney disease with heart failure and stage 1 through stage 4 chronic kidney disease, or unspecified chronic kidney disease: Secondary | ICD-10-CM | POA: Diagnosis present

## 2016-05-28 DIAGNOSIS — K449 Diaphragmatic hernia without obstruction or gangrene: Secondary | ICD-10-CM | POA: Diagnosis present

## 2016-05-28 DIAGNOSIS — N2889 Other specified disorders of kidney and ureter: Secondary | ICD-10-CM | POA: Diagnosis present

## 2016-05-28 DIAGNOSIS — D62 Acute posthemorrhagic anemia: Secondary | ICD-10-CM | POA: Diagnosis present

## 2016-05-28 DIAGNOSIS — L8990 Pressure ulcer of unspecified site, unspecified stage: Secondary | ICD-10-CM | POA: Insufficient documentation

## 2016-05-28 DIAGNOSIS — K920 Hematemesis: Secondary | ICD-10-CM

## 2016-05-28 DIAGNOSIS — E860 Dehydration: Secondary | ICD-10-CM | POA: Diagnosis present

## 2016-05-28 DIAGNOSIS — Z86711 Personal history of pulmonary embolism: Secondary | ICD-10-CM

## 2016-05-28 DIAGNOSIS — K922 Gastrointestinal hemorrhage, unspecified: Secondary | ICD-10-CM | POA: Diagnosis present

## 2016-05-28 DIAGNOSIS — Z79899 Other long term (current) drug therapy: Secondary | ICD-10-CM

## 2016-05-28 DIAGNOSIS — E1122 Type 2 diabetes mellitus with diabetic chronic kidney disease: Secondary | ICD-10-CM | POA: Diagnosis present

## 2016-05-28 DIAGNOSIS — N179 Acute kidney failure, unspecified: Secondary | ICD-10-CM | POA: Diagnosis present

## 2016-05-28 DIAGNOSIS — K210 Gastro-esophageal reflux disease with esophagitis: Principal | ICD-10-CM | POA: Diagnosis present

## 2016-05-28 DIAGNOSIS — R739 Hyperglycemia, unspecified: Secondary | ICD-10-CM

## 2016-05-28 DIAGNOSIS — E785 Hyperlipidemia, unspecified: Secondary | ICD-10-CM | POA: Diagnosis present

## 2016-05-28 LAB — I-STAT CHEM 8, ED
BUN: 13 mg/dL (ref 6–20)
CHLORIDE: 94 mmol/L — AB (ref 101–111)
CREATININE: 0.8 mg/dL (ref 0.44–1.00)
Calcium, Ion: 1.07 mmol/L — ABNORMAL LOW (ref 1.15–1.40)
Glucose, Bld: 400 mg/dL — ABNORMAL HIGH (ref 65–99)
HEMATOCRIT: 45 % (ref 36.0–46.0)
Hemoglobin: 15.3 g/dL — ABNORMAL HIGH (ref 12.0–15.0)
Potassium: 3.1 mmol/L — ABNORMAL LOW (ref 3.5–5.1)
SODIUM: 135 mmol/L (ref 135–145)
TCO2: 24 mmol/L (ref 0–100)

## 2016-05-28 LAB — COMPREHENSIVE METABOLIC PANEL
ALBUMIN: 3.7 g/dL (ref 3.5–5.0)
ALK PHOS: 71 U/L (ref 38–126)
ALT: 10 U/L — AB (ref 14–54)
ANION GAP: 18 — AB (ref 5–15)
AST: 19 U/L (ref 15–41)
BILIRUBIN TOTAL: 1 mg/dL (ref 0.3–1.2)
BUN: 12 mg/dL (ref 6–20)
CALCIUM: 9.4 mg/dL (ref 8.9–10.3)
CO2: 22 mmol/L (ref 22–32)
CREATININE: 1.1 mg/dL — AB (ref 0.44–1.00)
Chloride: 93 mmol/L — ABNORMAL LOW (ref 101–111)
GFR calc non Af Amer: 44 mL/min — ABNORMAL LOW (ref >60)
GFR, EST AFRICAN AMERICAN: 51 mL/min — AB (ref >60)
GLUCOSE: 379 mg/dL — AB (ref 65–99)
Potassium: 3 mmol/L — ABNORMAL LOW (ref 3.5–5.1)
SODIUM: 133 mmol/L — AB (ref 135–145)
TOTAL PROTEIN: 6.8 g/dL (ref 6.5–8.1)

## 2016-05-28 LAB — PROTIME-INR
INR: 1.02
Prothrombin Time: 13.5 seconds (ref 11.4–15.2)

## 2016-05-28 LAB — GLUCOSE, CAPILLARY
Glucose-Capillary: 330 mg/dL — ABNORMAL HIGH (ref 65–99)
Glucose-Capillary: 225 mg/dL — ABNORMAL HIGH (ref 65–99)
Glucose-Capillary: 123 mg/dL — ABNORMAL HIGH (ref 65–99)

## 2016-05-28 LAB — CBC
HEMATOCRIT: 43.8 % (ref 36.0–46.0)
HEMOGLOBIN: 14.2 g/dL (ref 12.0–15.0)
MCH: 27.1 pg (ref 26.0–34.0)
MCHC: 32.4 g/dL (ref 30.0–36.0)
MCV: 83.6 fL (ref 78.0–100.0)
Platelets: 312 10*3/uL (ref 150–400)
RBC: 5.24 MIL/uL — ABNORMAL HIGH (ref 3.87–5.11)
RDW: 14.7 % (ref 11.5–15.5)
WBC: 12.3 10*3/uL — ABNORMAL HIGH (ref 4.0–10.5)

## 2016-05-28 LAB — TYPE AND SCREEN
ABO/RH(D): O POS
Antibody Screen: NEGATIVE

## 2016-05-28 LAB — CBG MONITORING, ED: Glucose-Capillary: 379 mg/dL — ABNORMAL HIGH (ref 65–99)

## 2016-05-28 LAB — MAGNESIUM: Magnesium: 1.6 mg/dL — ABNORMAL LOW (ref 1.7–2.4)

## 2016-05-28 LAB — POC OCCULT BLOOD, ED: FECAL OCCULT BLD: NEGATIVE

## 2016-05-28 MED ORDER — PANTOPRAZOLE SODIUM 40 MG IV SOLR: 40.0000 mg | Freq: Two times a day (BID) | INTRAVENOUS | Status: DC

## 2016-05-28 MED ORDER — AMLODIPINE BESYLATE 5 MG PO TABS
5.0000 mg | ORAL_TABLET | Freq: Every day | ORAL | Status: DC
  Administered 2016-05-28 – 2016-06-02 (×4): 5 mg via ORAL
  Filled 2016-05-28 (×7): qty 1

## 2016-05-28 MED ORDER — INSULIN ASPART 100 UNIT/ML ~~LOC~~ SOLN
0.0000 [IU] | Freq: Three times a day (TID) | SUBCUTANEOUS | Status: DC
Start: 2016-05-28 — End: 2016-06-02
  Administered 2016-05-28: 4 [IU] via SUBCUTANEOUS
  Administered 2016-05-28: 3 [IU] via SUBCUTANEOUS
  Administered 2016-05-29: 1 [IU] via SUBCUTANEOUS
  Administered 2016-05-29: 2 [IU] via SUBCUTANEOUS
  Administered 2016-05-30: 7 [IU] via SUBCUTANEOUS
  Administered 2016-05-30: 2 [IU] via SUBCUTANEOUS
  Administered 2016-05-31: 3 [IU] via SUBCUTANEOUS
  Administered 2016-05-31: 5 [IU] via SUBCUTANEOUS
  Administered 2016-05-31 – 2016-06-01 (×3): 3 [IU] via SUBCUTANEOUS
  Administered 2016-06-01 – 2016-06-02 (×3): 2 [IU] via SUBCUTANEOUS

## 2016-05-28 MED ORDER — ACETAMINOPHEN 650 MG RE SUPP: 650.0000 mg | Freq: Four times a day (QID) | RECTAL | Status: DC | PRN

## 2016-05-28 MED ORDER — SODIUM CHLORIDE 0.9 % IV SOLN
8.0000 mg/h | INTRAVENOUS | Status: DC
  Administered 2016-05-28 – 2016-05-29 (×3): 8 mg/h via INTRAVENOUS
  Filled 2016-05-28 (×6): qty 80

## 2016-05-28 MED ORDER — SODIUM CHLORIDE 0.9% FLUSH: 3.0000 mL | Freq: Two times a day (BID) | INTRAVENOUS | Status: DC

## 2016-05-28 MED ORDER — ONDANSETRON HCL 4 MG/2ML IJ SOLN
4.0000 mg | Freq: Once | INTRAMUSCULAR | Status: AC
  Administered 2016-05-28: 4 mg via INTRAVENOUS
  Filled 2016-05-28: qty 2

## 2016-05-28 MED ORDER — PANTOPRAZOLE SODIUM 40 MG IV SOLR
40.0000 mg | Freq: Once | INTRAVENOUS | Status: AC
  Administered 2016-05-28: 40 mg via INTRAVENOUS
  Filled 2016-05-28: qty 40

## 2016-05-28 MED ORDER — MAGNESIUM SULFATE 2 GM/50ML IV SOLN
2.0000 g | Freq: Once | INTRAVENOUS | Status: AC
  Administered 2016-05-28: 2 g via INTRAVENOUS
  Filled 2016-05-28: qty 50

## 2016-05-28 MED ORDER — POTASSIUM CHLORIDE IN NACL 40-0.9 MEQ/L-% IV SOLN
INTRAVENOUS | Status: DC
  Administered 2016-05-28 – 2016-05-29 (×2): 75 mL/h via INTRAVENOUS
  Filled 2016-05-28 (×3): qty 1000

## 2016-05-28 MED ORDER — INSULIN ASPART 100 UNIT/ML ~~LOC~~ SOLN
0.0000 [IU] | Freq: Every day | SUBCUTANEOUS | Status: DC
  Administered 2016-05-29: 3 [IU] via SUBCUTANEOUS
  Administered 2016-05-31 (×2): 2 [IU] via SUBCUTANEOUS

## 2016-05-28 MED ORDER — SODIUM CHLORIDE 0.9 % IV BOLUS (SEPSIS)
500.0000 mL | Freq: Once | INTRAVENOUS | Status: AC
  Administered 2016-05-28: 500 mL via INTRAVENOUS

## 2016-05-28 MED ORDER — PROMETHAZINE HCL 25 MG/ML IJ SOLN
6.2500 mg | INTRAMUSCULAR | Status: DC | PRN
Start: 2016-05-28 — End: 2016-05-28
  Administered 2016-05-28: 6.25 mg via INTRAVENOUS
  Filled 2016-05-28: qty 1

## 2016-05-28 MED ORDER — SODIUM CHLORIDE 0.9 % IV SOLN: 250.0000 mL | INTRAVENOUS | Status: DC | PRN

## 2016-05-28 MED ORDER — PROMETHAZINE HCL 25 MG/ML IJ SOLN
6.2500 mg | Freq: Four times a day (QID) | INTRAMUSCULAR | Status: DC | PRN
  Administered 2016-05-29: 6.25 mg via INTRAVENOUS
  Filled 2016-05-28: qty 1

## 2016-05-28 MED ORDER — SODIUM CHLORIDE 0.9 % IV SOLN
INTRAVENOUS | Status: DC
  Filled 2016-05-28: qty 1000

## 2016-05-28 MED ORDER — SODIUM CHLORIDE 0.9% FLUSH: 3.0000 mL | INTRAVENOUS | Status: DC | PRN

## 2016-05-28 MED ORDER — ACETAMINOPHEN 325 MG PO TABS
650.0000 mg | ORAL_TABLET | Freq: Four times a day (QID) | ORAL | Status: DC | PRN
  Filled 2016-05-28: qty 2

## 2016-05-28 NOTE — ED Notes (Signed)
Admitting Provider at bedside. 

## 2016-05-28 NOTE — Progress Notes (Signed)
Spoke with daughter she is unable to visit her mother due to Vertigo. She reports that the patient has had the foley due to mass on kidney. She reports that the patient has an appoint coming up next week with the urologist. Will have tech complete foley care. Cont with plan of care

## 2016-05-28 NOTE — H&P (Signed)
Date: 05/28/2016               Patient Name:  Sherry [name] MRN: [id number]  DOB: [birth year] Age / Sex: 83 y.o., female   PCP: [facility] Service: Internal Medicine Teaching Service         Attending Physician: Dr. Beryle Beams, Alyson Locket, MD    First Contact: Dr. Reesa Chew Pager: [phone]  Second Contact: Dr. Marlowe Sax Pager: [phone]       After Hours (After 5p/  First Contact Pager: [phone]  weekends / holidays): Second Contact Pager: [phone]   Chief Complaint: Hematemesis and melena.  History of Present Illness: Sherry [name] is a 83 y.o.  elderly lady with past medical history significant for diabetes, hypertension, hyperlipidemia, atrial flutter on Eliquis , recent pulmonary embolism in 02/2016 an incidental finding of right renal mass of unknown etiology came to ED with complaints of coffee-ground vomitus and melena since last night.  Patient was seen at [facility] emergency department yesterday with complaint of choking and Dysphagia. Her labs are reassuring and patient was discharged home on Protonix which she has not yet begun to take. Overnight, patient had continued vomiting, bloody/coffee-ground. She has not had bloody vomit before. She was also complaining of black-colored stools but her FOBT done in ED was negative. She denies any abdominal pain or urinary symptoms. She denies any chest pain, palpitations or shortness of breath. She denies any fever or chills. No history of any recent sick contacts.  In ED patient was having mild tachycardia, stable vitals, actively vomiting dark maroon vomitus, mild leukocytosis and hypokalemia. GI was consulted in ED-going for EGD tomorrow morning.  Meds:  Current Meds  Medication Sig  . acetaminophen (TYLENOL) 325 MG tablet Take 650 mg by mouth every 4 (four) hours as needed for mild pain or moderate pain.  Marland Kitchen acidophilus (RISAQUAD) CAPS capsule Take 1 capsule by mouth daily.  Marland Kitchen amLODipine (NORVASC) 5 MG  tablet Take 5 mg by mouth daily.  Marland Kitchen apixaban (ELIQUIS) 5 MG TABS tablet Take 1 tablet (5 mg total) by mouth 2 (two) times daily.  Marland Kitchen atorvastatin (LIPITOR) 20 MG tablet Take 1 tablet by mouth at bedtime.  . gabapentin (NEURONTIN) 300 MG capsule Take 1 capsule by mouth at bedtime.  Marland Kitchen guaiFENesin (MUCINEX) 600 MG 12 hr tablet Take by mouth 2 (two) times daily.  . Insulin Detemir (LEVEMIR) 100 UNIT/ML Pen Inject 8 Units into the skin every morning.  . insulin lispro (HUMALOG) 100 UNIT/ML injection Inject into the skin 3 (three) times daily before meals. Sliding scale insulin. 0-99=0 units 100-149=2 units 150-199=3 units 200-249=4 units 250-299=5 units 300-349=6 units 350-399=7 units 400-449=8 units 450-499=9 units  . lisinopril-hydrochlorothiazide (PRINZIDE,ZESTORETIC) 20-12.5 MG tablet Take 2 tablets by mouth daily.   . magnesium oxide (MAG-OX) 400 (241.3 Mg) MG tablet Take 1 tablet (400 mg total) by mouth daily.  . ondansetron (ZOFRAN) 4 MG tablet Take 4 mg by mouth 2 (two) times daily. *May take every 8 hours as needed for nausea and vomiting  . pantoprazole (PROTONIX) 20 MG tablet Take 1 tablet (20 mg total) by mouth 2 (two) times daily.  . pantoprazole (PROTONIX) 40 MG tablet Take 1 tablet (40 mg total) by mouth 2 (two) times daily.  . polyethylene glycol (MIRALAX) packet Take 17 g by mouth daily as needed.  . potassium chloride SA (K-DUR,KLOR-CON) 10 MEQ tablet Take 1 tablet (10 mEq  total) by mouth daily.  . promethazine (PHENERGAN) 25 MG/ML injection Inject into the vein once.  . tamsulosin (FLOMAX) 0.4 MG CAPS capsule Take 1 capsule (0.4 mg total) by mouth daily.     Allergies: Allergies as of 05/28/2016  . (No Known Allergies)   Past Medical History:  Diagnosis Date  . Diabetes mellitus without complication ([place])   . Hyperlipidemia   . Hypertension   . Pulmonary embolism ([place])     Family History: Noncontributory.  Social History: Currently lives with her daughter for  last 2 weeks, before she was in a nursing facility. Nonsmoker, denies any alcohol and illicit drug use.  Review of Systems: A complete ROS was negative except as per HPI.   Physical Exam: Blood pressure (!) 160/73, pulse 98, temperature 97.1 F (36.2 C), temperature source Rectal, resp. rate 20, SpO2 100 %. Vitals:   05/28/16 1045 05/28/16 1100 05/28/16 1115 05/28/16 1200  BP: (!) 182/89 (!) 175/83 (!) 163/69 (!) 160/73  Pulse: 94 98 87 98  Resp: 19 (!) 21 19 20   Temp:      TempSrc:      SpO2: 100% 100% 100% 100%   General: Vital signs reviewed.  Patient is well-developed, frail elderly lady, in no acute distress and cooperative with exam.  Head: Normocephalic and atraumatic. Eyes: EOMI, conjunctivae normal, no scleral icterus.  Neck: Supple, trachea midline, normal ROM, no JVD, masses, thyromegaly, or carotid bruit present.  Cardiovascular: RRR, S1 normal, S2 normal, no murmurs, gallops, or rubs. Pulmonary/Chest: Clear to auscultation bilaterally, no wheezes, rales, or rhonchi. Abdominal: Soft, non-tender, non-distended, BS +, no masses, organomegaly, or guarding present.  Extremities: No lower extremity edema bilaterally,  pulses symmetric and intact bilaterally. No cyanosis or clubbing. Neurological: A&O x3, Strength is normal and symmetric bilaterally, cranial nerve II-XII are grossly intact, no focal motor deficit, sensory intact to light touch bilaterally.    Labs. CBC Latest Ref Rng & Units 05/28/2016 05/28/2016 05/27/2016  WBC 4.0 - 10.5 K/uL - 12.3(H) 8.4  Hemoglobin 12.0 - 15.0 g/dL 15.3(H) 14.2 12.5  Hematocrit 36.0 - 46.0 % 45.0 43.8 38.9  Platelets 150 - 400 K/uL - 312 269   CMP Latest Ref Rng & Units 05/28/2016 05/28/2016 05/27/2016  Glucose 65 - 99 mg/dL 400(H) 379(H) 220(H)  BUN 6 - 20 mg/dL 13 12 16   Creatinine 0.44 - 1.00 mg/dL 0.80 1.10(H) 0.99  Sodium 135 - 145 mmol/L 135 133(L) 135  Potassium 3.5 - 5.1 mmol/L 3.1(L) 3.0(L) 3.6  Chloride 101 - 111 mmol/L 94(L)  93(L) 97(L)  CO2 22 - 32 mmol/L - 22 28  Calcium 8.9 - 10.3 mg/dL - 9.4 9.1  Total Protein 6.5 - 8.1 g/dL - 6.8 -  Total Bilirubin 0.3 - 1.2 mg/dL - 1.0 -  Alkaline Phos 38 - 126 U/L - 71 -  AST 15 - 41 U/L - 19 -  ALT 14 - 54 U/L - 10(L) -   PT/INR. 13.5/1.02 FOBT. Negative Magnesium. 1.6  EKG: Sinus rhythm with First-degree heart andleft bundle branch block, T-wave inversion in lead 1 and aVL,V 5 and V6-unchanged from her prior tracings.  QTc 513.  CXR: FINDINGS: Cardiac silhouette is normal in size. No mediastinal or hilar masses. No evidence of adenopathy.  Mild stable scarring at the right apex. Lungs otherwise clear. No pleural effusion. No pneumothorax.  Skeletal structures are demineralized. There stable changes from a previous anterior cervical spine fusion.  IMPRESSION: No acute cardiopulmonary disease.   Assessment &  Plan by Problem:  DAVEDA LAROCK is a 83 y.o.  elderly lady with past medical history significant for diabetes, hypertension, hyperlipidemia, atrial flutter on Eliquis , recent pulmonary embolism in 02/2016 an incidental finding of right renal mass of unknown etiology came to ED with complaints of coffee-ground vomitus and melena since last night.  Upper GI bleed. Patient is on Eliquis for many years. She has no prior history of GI bleed. Vitals and hemoglobin stable. FOBT negative with complaints of melena. CT scan scan done in April 2018 showed some circumferential esophageal wall thickening and edema along with distal paraesophageal lymphadenopathy. There was also some concerning lesions in the right lobe of liver. She was seen and [facility] ED yesterday with complaints of worsening dysphagia. There is a concern for esophageal malignancy. She was given normal saline bolus of 500 mL in ED. GI is following.-Going for EGD tomorrow morning. -Admit to telemetry. -Nothing by mouth because of active vomiting. -Protonix infusion -Normal saline at 75 mL  per hour. -Phenergan 6.25 mg every 4 hourly when necessary-can be increased to 12.5 mg if needed. -Type and crossmatch -Repeat CBC in the morning.  Mild hyponatremia/Hypokalemia. she was found to have a potassium of 3, and sodium of 133, most likely because of active GI bleed. -Replace potassium with IV normal saline. -Repeat BMP in the morning.  AKI.  She was found to have mildly elevated creatinine at 1.10, baseline 0.85-0.89. Most likely due to dehydration in the setting of nausea and vomiting. -Repeat BMP after IV fluids.  Diabetes. Her recent A1c done on 04/19/2016 was 5.9. Her prior A1c done on 02/10/2016 was 10.8. She was on Levemir 8 units daily along with sliding scale at home. Her blood sugar during current admission ranges between 379-400. -CBC monitoring every 4 hourly. -Sensitive sliding scale.  Hypertension. Her blood pressure was elevated on presentation to ED. She was on amlodipine 5 mg daily and Zestoretic 20-12 .5 daily at home. -Continue amlodipine 5 mg daily. -We will hold Zestoretic, it can be restarted as needed.  History of atrial flutter. She has a long-standing history of atrial flutter, on Eliquis for many years according to patient. Her current EKG does not show any flutter waves. -Continue telemetry. -Hold Eliquis because of GI bleeding.  History of recent PE. She has an history of PE in February 2018, while she was on Eliquis, which was cleared on subsequent imaging. She denies any dyspnea and saturating well on room air.  HFrEF. Echo done in 04/20/2016 shows ejection fraction of 40-45%, mildly reduced from her previous echo done in January 2018. It was 45-50%. She denies any chest pain or shortness of breath. She was saturating well on room air. Chest x-ray was negative for any pulmonary congestion.  Hyperlipidemia. She is on Lipitor 20 mg daily at home, we will hold it currently because of active vomiting.  Right renal mass/urinary retention. During her  admission on 03/09/2016 with chest pain CT abdomen was obtained which shows incidental finding of right renal pelvic mass. There was some concern for urothelial malignancy.She is following with [facility] urology. They think that she is not a good surgical candidate and they want to repeat CT urogram after 6 weeks of initial imaging, patient was unable to keep up with her appointment for CT urogram. According to Dr. she has an upcoming appointment next week with urology.  Urinary retention. During her admission in early March 2018 she was found to have distended bladder with urinary retention, also  grew Proteus mirabilis on urinary culture which was treated with antibiotics. Foley was discontinued on discharge. During her visit to urology office on 04/06/2016 she was found to have more than 1000 cc of urine in her bladder. A Foley was placed with a plan to do voiding trials in 1-2 weeks. I cannot see any follow-up visit after that on Epic. She was also placed on Flomax during that visit. Patient came to ED with Foley. -Check UA. -We will confirm with daughter about when current Foley was placed, might need a replacement.  CODE STATUS. Full according to patient. It was partial code prior. Need to be confirmed from her daughter.  DVT prophylaxis. SCD's  Diet. Nothing by mouth.   Dispo: Admit patient to Observation with expected length of stay less than 2 midnights.  SignedLorella Nimrod, MD 05/28/2016, 12:39 PM  Pager: [phone]

## 2016-05-28 NOTE — ED Provider Notes (Signed)
[place] DEPT Provider Note   CSN: [id number] Arrival date & time: 05/28/16  0844     History   Chief Complaint Chief Complaint  Patient presents with  . Hematemesis    HPI Sherry [name] is a 83 y.o. female.  Patient with history of PE, hypertension, diabetes, atrial flutter on Eliquis, CKD -- presents with complaint of hematemesis. Patient was seen at [facility] emergency department yesterday with complaint of choking and vomiting. Her labs are reassuring and patient was discharged home on Protonix which Sherry [name] has not yet begun to take. Overnight, patient had continued vomiting, bloody/coffee-ground. Sherry [name] has not had bloody vomit before. No dark or bloody stools. Patient denies any chest pain or shortness of breath. No abdominal pain or urinary symptoms. No history of alcoholism or liver cirrhosis. No previous EGD results within Epic. Onset of symptoms acute. Course is constant. Nothing makes symptoms better or worse.      Past Medical History:  Diagnosis Date  . Diabetes mellitus without complication ([place])   . Hyperlipidemia   . Hypertension   . Pulmonary embolism [facility])     Patient Active Problem List   Diagnosis Date Noted  . Atrial flutter ([place])   . Gastroenteritis 04/24/2016  . Aphasia 04/18/2016  . Nausea & vomiting 03/27/2016  . Advance care planning   . UTI (urinary tract infection) 03/12/2016  . Chest pain 03/09/2016  . PE (pulmonary thromboembolism) ([place]) 02/26/2016  . Diabetes mellitus, type 2 ([place]) 02/26/2016  . Anemia 02/26/2016  . Chronic kidney disease (CKD), stage III (moderate) 02/26/2016  . Essential hypertension 02/26/2016  . Goals of care, counseling/discussion   . Adult failure to thrive syndrome   . Palliative care by specialist   . Sepsis ([place]) 11/26/2014    Past Surgical History:  Procedure Laterality Date  . APPENDECTOMY    . BACK SURGERY    . SHOULDER SURGERY      OB History    No data available       Home Medications     Prior to Admission medications   Medication Sig Start Date End Date Taking? Authorizing Provider  acetaminophen (TYLENOL) 325 MG tablet Take 650 mg by mouth every 4 (four) hours as needed for mild pain or moderate pain.    [provider]  acidophilus (RISAQUAD) CAPS capsule Take 1 capsule by mouth daily.    [provider]  amLODipine (NORVASC) 5 MG tablet Take 5 mg by mouth daily.    [provider]  apixaban (ELIQUIS) 5 MG TABS tablet Take 1 tablet (5 mg total) by mouth 2 (two) times daily. 03/08/16   Isaac Bliss, Rayford Halsted, MD  atorvastatin (LIPITOR) 20 MG tablet Take 1 tablet by mouth at bedtime.    [provider]  gabapentin (NEURONTIN) 300 MG capsule Take 1 capsule by mouth at bedtime.    [provider]  guaiFENesin (MUCINEX) 600 MG 12 hr tablet Take by mouth 2 (two) times daily.    [provider]  Insulin Detemir (LEVEMIR) 100 UNIT/ML Pen Inject 8 Units into the skin every morning. 04/27/16   Loletha Grayer, MD  insulin lispro (HUMALOG) 100 UNIT/ML injection Inject into the skin 3 (three) times daily before meals. Sliding scale insulin. 0-99=0 units 100-149=2 units 150-199=3 units 200-249=4 units 250-299=5 units 300-349=6 units 350-399=7 units 400-449=8 units 450-499=9 units    [provider]  lisinopril-hydrochlorothiazide (PRINZIDE,ZESTORETIC) 20-12.5 MG tablet Take 2 tablets by mouth daily.     [provider]  magnesium oxide (MAG-OX) 400 (241.3 Mg) MG tablet Take 1 tablet (400 mg total) by mouth daily. 04/27/16   Loletha Grayer, MD  ondansetron (ZOFRAN) 4 MG tablet Take 4 mg by mouth 2 (two) times daily. *May take every 8 hours as needed for nausea and vomiting    [provider]  pantoprazole (PROTONIX) 20 MG tablet Take 1 tablet (20 mg total) by mouth 2 (two) times daily. 05/27/16   Fredia Sorrow, MD  pantoprazole (PROTONIX) 40 MG tablet Take 1 tablet (40 mg total) by mouth 2 (two)  times daily. 04/27/16   Loletha Grayer, MD  polyethylene glycol [facility]) packet Take 17 g by mouth daily as needed. 04/27/16   Loletha Grayer, MD  potassium chloride SA (K-DUR,KLOR-CON) 10 MEQ tablet Take 1 tablet (10 mEq total) by mouth daily. 04/27/16   Loletha Grayer, MD  promethazine (PHENERGAN) 25 MG/ML injection Inject into the vein once.    [provider]  tamsulosin (FLOMAX) 0.4 MG CAPS capsule Take 1 capsule (0.4 mg total) by mouth daily. 03/14/16   Fritzi Mandes, MD    Family History Family History  Problem Relation Age of Onset  . Diabetes Mother   . Cancer Mother   . Cancer Father   . Bladder Cancer Neg Hx   . Kidney cancer Neg Hx     Social History Social History  Substance Use Topics  . Smoking status: Never Smoker  . Smokeless tobacco: Never Used  . Alcohol use No     Allergies   Patient has no known allergies.   Review of Systems Review of Systems  Constitutional: Negative for fever.  HENT: Negative for rhinorrhea and sore throat.   Eyes: Negative for redness.  Respiratory: Negative for cough.   Cardiovascular: Negative for chest pain.  Gastrointestinal: Positive for nausea and vomiting. Negative for abdominal pain and diarrhea.  Genitourinary: Negative for dysuria.  Musculoskeletal: Negative for myalgias.  Skin: Negative for rash.  Neurological: Negative for headaches.     Physical Exam Updated Vital Signs There were no vitals taken for this visit.  Physical Exam  Constitutional: Sherry [name] appears well-developed and well-nourished.  HENT:  Head: Normocephalic and atraumatic.  Dark dried blood noted in oropharynx.  Eyes: Conjunctivae are normal. Right eye exhibits no discharge. Left eye exhibits no discharge.  Conjunctiva are not pale, capillaries visible.  Neck: Normal range of motion. Neck supple.  Cardiovascular: Normal rate, regular rhythm and normal heart sounds.   Pulmonary/Chest: Effort normal and breath sounds normal. No  respiratory distress. Sherry [name] has no wheezes. Sherry [name] has no rales.  Abdominal: Soft. There is no tenderness. There is no rebound and no guarding.  Neurological: Sherry [name] is alert.  Skin: Skin is warm and dry.  Psychiatric: Sherry [name] has a normal mood and affect.  Nursing note and vitals reviewed.    ED Treatments / Results  Labs (all labs ordered are listed, but only abnormal results are displayed) Labs Reviewed  CBC - Abnormal; Notable for the following:       Result Value   WBC 12.3 (*)    RBC 5.24 (*)    All other components within normal limits  COMPREHENSIVE METABOLIC PANEL - Abnormal; Notable for the following:    Sodium 133 (*)    Potassium 3.0 (*)    Chloride 93 (*)    Glucose, Bld 379 (*)    Creatinine, Ser 1.10 (*)    ALT 10 (*)    GFR calc non Af Amer 44 (*)  GFR calc Af Amer 51 (*)    Anion gap 18 (*)    All other components within normal limits  I-STAT CHEM 8, ED - Abnormal; Notable for the following:    Potassium 3.1 (*)    Chloride 94 (*)    Glucose, Bld 400 (*)    Calcium, Ion 1.07 (*)    Hemoglobin 15.3 (*)    All other components within normal limits  CBG MONITORING, ED - Abnormal; Notable for the following:    Glucose-Capillary 379 (*)    All other components within normal limits  PROTIME-INR  PH, GASTRIC FLUID (GASTROCCULT CARD)  POC OCCULT BLOOD, ED  TYPE AND SCREEN    EKG  EKG Interpretation  Date/Time:  Sunday May 28 2016 09:07:23 EDT Ventricular Rate:  95 PR Interval:    QRS Duration: 146 QT Interval:  408 QTC Calculation: 513 R Axis:   -25 Text Interpretation:  Sinus or ectopic atrial rhythm Left bundle branch block Since last tracing artifact resolved Confirmed by Daleen Bo [id number]) on 05/28/2016 9:22:33 AM       Radiology Dg Chest Portable 1 View  Result Date: 05/28/2016 CLINICAL DATA:  Hematemesis. Family reported to EMS that Pt vomited blood all night. Pt went to [facility] on Sat. and was DC with a RX for Protonix. Hx of HTN and  diabetes. Non-smoker. EXAM: PORTABLE CHEST 1 VIEW COMPARISON:  4/10/8 FINDINGS: Cardiac silhouette is normal in size. No mediastinal or hilar masses. No evidence of adenopathy. Mild stable scarring at the right apex. Lungs otherwise clear. No pleural effusion. No pneumothorax. Skeletal structures are demineralized. There stable changes from a previous anterior cervical spine fusion. IMPRESSION: No acute cardiopulmonary disease. Electronically Signed   By: Lajean Manes M.D.   On: 05/28/2016 10:11    Procedures Procedures (including critical care time)  Medications Ordered in ED Medications  pantoprazole (PROTONIX) 80 mg in sodium chloride 0.9 % 250 mL (0.32 mg/mL) infusion (8 mg/hr Intravenous New Bag/Given 05/28/16 1018)  pantoprazole (PROTONIX) injection 40 mg (not administered)  sodium chloride 0.9 % bolus 500 mL (0 mLs Intravenous Stopped 05/28/16 1022)  pantoprazole (PROTONIX) injection 40 mg (40 mg Intravenous Given 05/28/16 0940)  ondansetron (ZOFRAN) injection 4 mg (4 mg Intravenous Given 05/28/16 0938)  pantoprazole (PROTONIX) injection 40 mg (40 mg Intravenous Given 05/28/16 0950)     Initial Impression / Assessment and Plan / ED Course  I have reviewed the triage vital signs and the nursing notes.  Pertinent labs & imaging results that were available during my care of the patient were reviewed by me and considered in my medical decision making (see chart for details).     Patient seen and examined. Pt actively vomiting dark maroon vomitus. Type and screen ordered. IV fluids and IV Protonix ordered. Patient currently stable but will need closely monitored. Will need admission for GI consult and monitoring. Workup ongoing.  Vital signs reviewed and are as follows: BP (!) 167/85 (BP Location: Right Arm)   Pulse 96   Temp 97.1 F (36.2 C) (Rectal)   Resp 18   SpO2 100%   9:42 AM Spoke with Dr. Michail Sermon who will consult.   Istat hgb 15. Vitals stable. Pt did have active bloody  vomit here.    10:29 AM IMTS to admit.   Final Clinical Impressions(s) / ED Diagnoses   Final diagnoses:  Hematemesis with nausea  Hypokalemia  Hyperglycemia   Admit.   New Prescriptions New Prescriptions   No medications  on file     Carlisle Cater, Hershal Coria 05/28/16 1030    Daleen Bo, MD 05/28/16 [phone]

## 2016-05-28 NOTE — ED Notes (Signed)
Pt states stomach is feeling better.

## 2016-05-28 NOTE — Consult Note (Signed)
Referring Provider: Dr. Eulis Foster Primary Care Physician:  [facility] Primary Gastroenterologist:  UNASSIGNED  Reason for Consultation:  GI bleed  HPI: Sherry [name] is a 83 y.o. female with the acute onset of vomiting food several times last night followed by vomiting black fluid that happened multiple times at home. Reports multiple episodes of melenic stools last night as well and she thinks the melena started before the coffee grounds emesis. Has been having crampy abdominal pain as well. Hypertensive with HR 80's - 104 in ER. Denies dizziness or lightheadedness. Denies red blood vomitus or hematochezia. She is on Eliquis for Aflutter. Denies NSAIDs or alcohol. Denies having GI bleeding in the past and no known history of ulcers. She was in [facility] ER yesterday for dysphagia to solids and liquids that reportedly had been occurring for several months. She was d/c'd on PO Protonix. She reportedly vomited about a half cup of black fluid in the ER. Hgb 15.3. BUN 13.  No family present during my evaluation.  Past Medical History:  Diagnosis Date  . Diabetes mellitus without complication ([place])   . Hyperlipidemia   . Hypertension   . Pulmonary embolism [facility])     Past Surgical History:  Procedure Laterality Date  . APPENDECTOMY    . BACK SURGERY    . SHOULDER SURGERY      Prior to Admission medications   Medication Sig Start Date End Date Taking? Authorizing Provider  acetaminophen (TYLENOL) 325 MG tablet Take 650 mg by mouth every 4 (four) hours as needed for mild pain or moderate pain.    [provider]  acidophilus (RISAQUAD) CAPS capsule Take 1 capsule by mouth daily.    [provider]  amLODipine (NORVASC) 5 MG tablet Take 5 mg by mouth daily.    [provider]  apixaban (ELIQUIS) 5 MG TABS tablet Take 1 tablet (5 mg total) by mouth 2 (two) times daily. 03/08/16   Isaac Bliss, Rayford Halsted, MD  atorvastatin (LIPITOR) 20 MG tablet Take 1  tablet by mouth at bedtime.    [provider]  gabapentin (NEURONTIN) 300 MG capsule Take 1 capsule by mouth at bedtime.    [provider]  guaiFENesin (MUCINEX) 600 MG 12 hr tablet Take by mouth 2 (two) times daily.    [provider]  Insulin Detemir (LEVEMIR) 100 UNIT/ML Pen Inject 8 Units into the skin every morning. 04/27/16   Loletha Grayer, MD  insulin lispro (HUMALOG) 100 UNIT/ML injection Inject into the skin 3 (three) times daily before meals. Sliding scale insulin. 0-99=0 units 100-149=2 units 150-199=3 units 200-249=4 units 250-299=5 units 300-349=6 units 350-399=7 units 400-449=8 units 450-499=9 units    [provider]  lisinopril-hydrochlorothiazide (PRINZIDE,ZESTORETIC) 20-12.5 MG tablet Take 2 tablets by mouth daily.     [provider]  magnesium oxide (MAG-OX) 400 (241.3 Mg) MG tablet Take 1 tablet (400 mg total) by mouth daily. 04/27/16   Loletha Grayer, MD  ondansetron (ZOFRAN) 4 MG tablet Take 4 mg by mouth 2 (two) times daily. *May take every 8 hours as needed for nausea and vomiting    [provider]  pantoprazole (PROTONIX) 20 MG tablet Take 1 tablet (20 mg total) by mouth 2 (two) times daily. 05/27/16   Fredia Sorrow, MD  pantoprazole (PROTONIX) 40 MG tablet Take 1 tablet (40 mg total) by mouth 2 (two) times daily. 04/27/16   Loletha Grayer, MD  polyethylene glycol [facility]) packet Take 17 g by mouth daily  as needed. 04/27/16   Loletha Grayer, MD  potassium chloride SA (K-DUR,KLOR-CON) 10 MEQ tablet Take 1 tablet (10 mEq total) by mouth daily. 04/27/16   Loletha Grayer, MD  promethazine (PHENERGAN) 25 MG/ML injection Inject into the vein once.    [provider]  tamsulosin (FLOMAX) 0.4 MG CAPS capsule Take 1 capsule (0.4 mg total) by mouth daily. 03/14/16   Fritzi Mandes, MD    Scheduled Meds: . [START ON 05/31/2016] pantoprazole  40 mg Intravenous Q12H   Continuous Infusions: . pantoprozole  (PROTONIX) infusion    . sodium chloride 500 mL (05/28/16 0944)   PRN Meds:.  Allergies as of 05/28/2016  . (No Known Allergies)    Family History  Problem Relation Age of Onset  . Diabetes Mother   . Cancer Mother   . Cancer Father   . Bladder Cancer Neg Hx   . Kidney cancer Neg Hx     Social History   Social History  . Marital status: Single    Spouse name: N/A  . Number of children: N/A  . Years of education: N/A   Occupational History  . Not on file.   Social History Main Topics  . Smoking status: Never Smoker  . Smokeless tobacco: Never Used  . Alcohol use No  . Drug use: No  . Sexual activity: No   Other Topics Concern  . Not on file   Social History Narrative  . No narrative on file    Review of Systems: All negative except as stated above in HPI.  Physical Exam: Vital signs: Vitals:   05/28/16 0927  BP: (!) 167/85  Pulse: 96  Resp: 18  Temp: 97.1 F (36.2 C)     General:   Somnolent, elderly, frail, thin, no acute distress  Head: atraumatic, normocephalic Eyes: anicteric sclera ENT: black colored tongue, oropharynx clear Neck: supple, nontender Lungs:  Clear throughout to auscultation.   No wheezes, crackles, or rhonchi. No acute distress. Heart:  Regular rate and rhythm; no murmurs, clicks, rubs,  or gallops. Abdomen: epigastric tenderness with minimal guarding, soft, nondistended, +BS  Rectal:  Deferred Ext: no edema  GI:  Lab Results:  Recent Labs  05/27/16 1324 05/28/16 0903 05/28/16 0921  WBC 8.4 12.3*  --   HGB 12.5 14.2 15.3*  HCT 38.9 43.8 45.0  PLT 269 312  --    BMET  Recent Labs  05/27/16 1324 05/28/16 0903 05/28/16 0921  NA 135 133* 135  K 3.6 3.0* 3.1*  CL 97* 93* 94*  CO2 28 22  --   GLUCOSE 220* 379* 400*  BUN 16 12 13   CREATININE 0.99 1.10* 0.80  CALCIUM 9.1 9.4  --    LFT  Recent Labs  05/28/16 0903  PROT 6.8  ALBUMIN 3.7  AST 19  ALT 10*  ALKPHOS 71  BILITOT 1.0   PT/INR  Recent  Labs  05/28/16 0903  LABPROT 13.5  INR 1.02     Studies/Results: No results found.  Impression/Plan: 83 yo woman on Eliquis presenting with an upper GI bleed with coffee grounds emesis and melena. Hemodynamically stable. With profuse nonbloody vomiting prior to onset of coffee grounds emesis she may have a Mallory Weiss tear. Peptic ulcer disease and malignancy also in differential for this presentation. Due to likely need for resuming Eliquis at discharge and because of the amount of GI bleeding, will have one of my partners do an EGD tomorrow to further evaluate. Keep NPO. Protonix infusion. IVFs.  Supportive care. EGD tomorrow with Propofol sedation. Will follow in consultation.    LOS: 0 days   [place] C.  05/28/2016, 10:03 AM  Pager [phone]  AFTER 5 pm or on weekends please call [phone]

## 2016-05-28 NOTE — ED Provider Notes (Signed)
  Face-to-face evaluation   History: Persistent vomiting, which appears dark, bloody.  Was evaluated yesterday for dyspepsia.  Physical exam: Alert elderly female.  Actively vomiting old blood.  She is lucid and controlling her airway at this time.  Heart tachycardic without murmur lungs clear.  Abdomen soft nontender.   Patient Vitals for the past 24 hrs:  BP Temp Temp src Pulse Resp SpO2  05/28/16 1200 (!) 160/73 - - 98 20 100 %  05/28/16 1115 (!) 163/69 - - 87 19 100 %  05/28/16 1100 (!) 175/83 - - 98 (!) 21 100 %  05/28/16 1045 (!) 182/89 - - 94 19 100 %  05/28/16 1030 (!) 174/78 - - 87 17 100 %  05/28/16 1015 (!) 169/68 - - 81 17 99 %  05/28/16 1000 (!) 185/81 - - 96 20 100 %  05/28/16 0945 (!) 182/85 - - 93 (!) 23 100 %  05/28/16 0930 (!) 187/76 - - 89 20 100 %  05/28/16 0927 (!) 167/85 97.1 F (36.2 C) Rectal 96 18 100 %  05/28/16 0915 (!) 167/85 - - (!) 104 (!) 24 100 %       Medical screening examination/treatment/procedure(s) were conducted as a shared visit with non-physician practitioner(s) and myself.  I personally evaluated the patient during the encounter    Daleen Bo, MD 05/28/16 [phone]

## 2016-05-28 NOTE — ED Triage Notes (Signed)
INformation provided By EMS. Family reported to EMS that Pt vomited blood all night . Pt was went to [facility] on sat and was DC with a RX for Protonix Family wanted to wait until The Pt PCP saw PT. Family did not fill RX for Protonix.

## 2016-05-28 NOTE — ED Notes (Signed)
Gastro MD at bedside  

## 2016-05-29 ENCOUNTER — Observation Stay (HOSPITAL_COMMUNITY): Payer: Medicare Other | Admitting: Anesthesiology

## 2016-05-29 ENCOUNTER — Encounter (HOSPITAL_COMMUNITY): Payer: Self-pay | Admitting: *Deleted

## 2016-05-29 ENCOUNTER — Encounter (HOSPITAL_COMMUNITY)
Admission: EM | Disposition: A | Discharge status: Home / Self Care | Payer: Self-pay | Source: Home / Self Care | Attending: Oncology

## 2016-05-29 DIAGNOSIS — Z79899 Other long term (current) drug therapy: Secondary | ICD-10-CM | POA: Diagnosis not present

## 2016-05-29 DIAGNOSIS — I5022 Chronic systolic (congestive) heart failure: Secondary | ICD-10-CM

## 2016-05-29 DIAGNOSIS — I110 Hypertensive heart disease with heart failure: Secondary | ICD-10-CM | POA: Diagnosis not present

## 2016-05-29 DIAGNOSIS — K922 Gastrointestinal hemorrhage, unspecified: Secondary | ICD-10-CM | POA: Diagnosis not present

## 2016-05-29 DIAGNOSIS — K449 Diaphragmatic hernia without obstruction or gangrene: Secondary | ICD-10-CM

## 2016-05-29 DIAGNOSIS — I2782 Chronic pulmonary embolism: Secondary | ICD-10-CM

## 2016-05-29 DIAGNOSIS — E119 Type 2 diabetes mellitus without complications: Secondary | ICD-10-CM

## 2016-05-29 DIAGNOSIS — E876 Hypokalemia: Secondary | ICD-10-CM

## 2016-05-29 DIAGNOSIS — N179 Acute kidney failure, unspecified: Secondary | ICD-10-CM

## 2016-05-29 DIAGNOSIS — Z7901 Long term (current) use of anticoagulants: Secondary | ICD-10-CM

## 2016-05-29 DIAGNOSIS — K219 Gastro-esophageal reflux disease without esophagitis: Secondary | ICD-10-CM

## 2016-05-29 DIAGNOSIS — K210 Gastro-esophageal reflux disease with esophagitis: Principal | ICD-10-CM

## 2016-05-29 DIAGNOSIS — Z794 Long term (current) use of insulin: Secondary | ICD-10-CM | POA: Diagnosis not present

## 2016-05-29 DIAGNOSIS — I4892 Unspecified atrial flutter: Secondary | ICD-10-CM

## 2016-05-29 DIAGNOSIS — E871 Hypo-osmolality and hyponatremia: Secondary | ICD-10-CM

## 2016-05-29 HISTORY — PX: ESOPHAGOGASTRODUODENOSCOPY: SHX5428

## 2016-05-29 LAB — HEMOGLOBIN A1C
Hgb A1c MFr Bld: 6.7 % — ABNORMAL HIGH (ref 4.8–5.6)
Mean Plasma Glucose: 146 mg/dL

## 2016-05-29 LAB — COMPREHENSIVE METABOLIC PANEL
ALT: 7 U/L — ABNORMAL LOW (ref 14–54)
ANION GAP: 9 (ref 5–15)
AST: 15 U/L (ref 15–41)
Albumin: 2.7 g/dL — ABNORMAL LOW (ref 3.5–5.0)
Alkaline Phosphatase: 52 U/L (ref 38–126)
BUN: 17 mg/dL (ref 6–20)
CALCIUM: 8.3 mg/dL — AB (ref 8.9–10.3)
CHLORIDE: 106 mmol/L (ref 101–111)
CO2: 26 mmol/L (ref 22–32)
Creatinine, Ser: 1.39 mg/dL — ABNORMAL HIGH (ref 0.44–1.00)
GFR calc non Af Amer: 33 mL/min — ABNORMAL LOW (ref >60)
GFR, EST AFRICAN AMERICAN: 39 mL/min — AB (ref >60)
Glucose, Bld: 180 mg/dL — ABNORMAL HIGH (ref 65–99)
Potassium: 4.1 mmol/L (ref 3.5–5.1)
SODIUM: 141 mmol/L (ref 135–145)
Total Bilirubin: 0.8 mg/dL (ref 0.3–1.2)
Total Protein: 5.1 g/dL — ABNORMAL LOW (ref 6.5–8.1)

## 2016-05-29 LAB — URINALYSIS, ROUTINE W REFLEX MICROSCOPIC
BILIRUBIN URINE: NEGATIVE
Glucose, UA: NEGATIVE mg/dL
Ketones, ur: 20 mg/dL — AB
Nitrite: NEGATIVE
PROTEIN: NEGATIVE mg/dL
Specific Gravity, Urine: 1.011 (ref 1.005–1.030)
pH: 5 (ref 5.0–8.0)

## 2016-05-29 LAB — VITAMIN B12: Vitamin B-12: 214 pg/mL (ref 180–914)

## 2016-05-29 LAB — IRON AND TIBC
IRON: 17 ug/dL — AB (ref 28–170)
SATURATION RATIOS: 7 % — AB (ref 10.4–31.8)
TIBC: 234 ug/dL — AB (ref 250–450)
UIBC: 217 ug/dL

## 2016-05-29 LAB — PROTIME-INR
INR: 1.08
Prothrombin Time: 14 seconds (ref 11.4–15.2)

## 2016-05-29 LAB — GLUCOSE, CAPILLARY
GLUCOSE-CAPILLARY: 122 mg/dL — AB (ref 65–99)
GLUCOSE-CAPILLARY: 129 mg/dL — AB (ref 65–99)
GLUCOSE-CAPILLARY: 162 mg/dL — AB (ref 65–99)
GLUCOSE-CAPILLARY: 166 mg/dL — AB (ref 65–99)
GLUCOSE-CAPILLARY: 171 mg/dL — AB (ref 65–99)
GLUCOSE-CAPILLARY: 254 mg/dL — AB (ref 65–99)
Glucose-Capillary: 115 mg/dL — ABNORMAL HIGH (ref 65–99)

## 2016-05-29 LAB — CBC
HEMATOCRIT: 33.6 % — AB (ref 36.0–46.0)
HEMOGLOBIN: 10.6 g/dL — AB (ref 12.0–15.0)
MCH: 26.8 pg (ref 26.0–34.0)
MCHC: 31.5 g/dL (ref 30.0–36.0)
MCV: 84.8 fL (ref 78.0–100.0)
Platelets: 232 10*3/uL (ref 150–400)
RBC: 3.96 MIL/uL (ref 3.87–5.11)
RDW: 15.2 % (ref 11.5–15.5)
WBC: 8.3 10*3/uL (ref 4.0–10.5)

## 2016-05-29 LAB — RETICULOCYTES
RBC.: 4.48 MIL/uL (ref 3.87–5.11)
RETIC CT PCT: 1.1 % (ref 0.4–3.1)
Retic Count, Absolute: 49.3 10*3/uL (ref 19.0–186.0)

## 2016-05-29 LAB — FERRITIN: FERRITIN: 90 ng/mL (ref 11–307)

## 2016-05-29 LAB — FOLATE: Folate: 20.9 ng/mL (ref >5.9)

## 2016-05-29 LAB — MAGNESIUM: Magnesium: 2.4 mg/dL (ref 1.7–2.4)

## 2016-05-29 SURGERY — EGD (ESOPHAGOGASTRODUODENOSCOPY)
Anesthesia: Monitor Anesthesia Care

## 2016-05-29 MED ORDER — ATORVASTATIN CALCIUM 20 MG PO TABS
20.0000 mg | ORAL_TABLET | Freq: Every day | ORAL | Status: DC
  Administered 2016-05-29 – 2016-06-01 (×3): 20 mg via ORAL
  Filled 2016-05-29 (×4): qty 1

## 2016-05-29 MED ORDER — ONDANSETRON HCL 4 MG/2ML IJ SOLN
4.0000 mg | Freq: Once | INTRAMUSCULAR | Status: AC
  Administered 2016-05-29: 4 mg via INTRAVENOUS
  Filled 2016-05-29: qty 2

## 2016-05-29 MED ORDER — DEXTROSE-NACL 5-0.45 % IV SOLN
INTRAVENOUS | Status: DC
  Administered 2016-05-29: 100 mL/h via INTRAVENOUS
  Administered 2016-05-30: 05:00:00 via INTRAVENOUS

## 2016-05-29 MED ORDER — MEPERIDINE HCL 25 MG/ML IJ SOLN: 6.2500 mg | INTRAMUSCULAR | Status: DC | PRN

## 2016-05-29 MED ORDER — HYDROMORPHONE HCL 1 MG/ML IJ SOLN: 0.2500 mg | INTRAMUSCULAR | Status: DC | PRN

## 2016-05-29 MED ORDER — PROPOFOL 10 MG/ML IV BOLUS
INTRAVENOUS | Status: DC | PRN
  Administered 2016-05-29 (×3): 10 mg via INTRAVENOUS

## 2016-05-29 MED ORDER — SODIUM CHLORIDE 0.9 % IV SOLN
INTRAVENOUS | Status: DC
  Administered 2016-05-29: 100 mL/h via INTRAVENOUS

## 2016-05-29 MED ORDER — PROMETHAZINE HCL 25 MG/ML IJ SOLN: 6.2500 mg | Freq: Once | INTRAMUSCULAR | Status: DC

## 2016-05-29 MED ORDER — DEXTROSE 5 % IV SOLN
1.0000 g | INTRAVENOUS | Status: DC
  Administered 2016-05-29 – 2016-06-01 (×4): 1 g via INTRAVENOUS
  Filled 2016-05-29 (×5): qty 10

## 2016-05-29 MED ORDER — APIXABAN 5 MG PO TABS
5.0000 mg | ORAL_TABLET | Freq: Two times a day (BID) | ORAL | Status: DC
  Administered 2016-05-29 – 2016-06-02 (×5): 5 mg via ORAL
  Filled 2016-05-29 (×8): qty 1

## 2016-05-29 MED ORDER — PROMETHAZINE HCL 25 MG/ML IJ SOLN
12.5000 mg | Freq: Four times a day (QID) | INTRAMUSCULAR | Status: DC | PRN
  Administered 2016-05-30 – 2016-06-01 (×5): 12.5 mg via INTRAVENOUS
  Filled 2016-05-29 (×6): qty 1

## 2016-05-29 MED ORDER — ONDANSETRON HCL 4 MG/2ML IJ SOLN: 4.0000 mg | Freq: Once | INTRAMUSCULAR | Status: DC | PRN

## 2016-05-29 MED ORDER — PANTOPRAZOLE SODIUM 40 MG PO TBEC
40.0000 mg | DELAYED_RELEASE_TABLET | Freq: Two times a day (BID) | ORAL | Status: DC
  Administered 2016-05-29 – 2016-05-30 (×3): 40 mg via ORAL
  Filled 2016-05-29 (×5): qty 1

## 2016-05-29 MED ORDER — PROPOFOL 500 MG/50ML IV EMUL
INTRAVENOUS | Status: DC | PRN
  Administered 2016-05-29: 50 ug/kg/min via INTRAVENOUS

## 2016-05-29 MED ORDER — LACTATED RINGERS IV SOLN
INTRAVENOUS | Status: DC | PRN
  Administered 2016-05-29: 11:00:00 via INTRAVENOUS

## 2016-05-29 NOTE — Anesthesia Preprocedure Evaluation (Signed)
Anesthesia Evaluation  Patient identified by MRN, date of birth, ID band Patient awake    Reviewed: Allergy & Precautions, NPO status , Patient's Chart, lab work & pertinent test results  Airway Mallampati: I  TM Distance: >3 FB Neck ROM: Full    Dental   Pulmonary    Pulmonary exam normal        Cardiovascular hypertension, Normal cardiovascular exam     Neuro/Psych    GI/Hepatic   Endo/Other  diabetes, Type 2, Insulin Dependent  Renal/GU Renal InsufficiencyRenal disease     Musculoskeletal   Abdominal   Peds  Hematology   Anesthesia Other Findings   Reproductive/Obstetrics                             Anesthesia Physical Anesthesia Plan  ASA: III  Anesthesia Plan: MAC   Post-op Pain Management:    Induction: Intravenous  Airway Management Planned: Simple Face Mask  Additional Equipment:   Intra-op Plan:   Post-operative Plan:   Informed Consent: I have reviewed the patients History and Physical, chart, labs and discussed the procedure including the risks, benefits and alternatives for the proposed anesthesia with the patient or authorized representative who has indicated his/her understanding and acceptance.     Plan Discussed with: CRNA and Surgeon  Anesthesia Plan Comments:         Anesthesia Quick Evaluation

## 2016-05-29 NOTE — Progress Notes (Signed)
Subjective: The patient continues to report nausea and is unable to tolerate liquids well.  She is still fatigued but is not reporting any more coffee ground emesis, but does have non bloody vomiting.    Objective: Vital signs in last 24 hours: Vitals:   05/29/16 1119 05/29/16 1205 05/29/16 1252 05/29/16 1253  BP: (!) 182/50 (!) 180/66 (!) 180/49 136/69  Pulse: 78 80 79 83  Resp: 16 20 17    Temp:  98.8 F (37.1 C) 98.4 F (36.9 C)   TempSrc:  Oral Oral   SpO2: 100% 100% 100%    Intake/Output Summary (Last 24 hours) at 05/29/16 1136 Last data filed at 05/29/16 1045  Gross per 24 hour  Intake              375 ml  Output              600 ml  Net             -225 ml   General appearance: fatigued and mild distress Lungs: clear to auscultation bilaterally Abdomen: soft, non-tender; bowel sounds normal; no masses,  no organomegaly Lab Results: CMP Latest Ref Rng & Units 05/29/2016 05/28/2016 05/28/2016  Glucose 65 - 99 mg/dL 180(H) 400(H) 379(H)  BUN 6 - 20 mg/dL 17 13 12   Creatinine 0.44 - 1.00 mg/dL 1.39(H) 0.80 1.10(H)  Sodium 135 - 145 mmol/L 141 135 133(L)  Potassium 3.5 - 5.1 mmol/L 4.1 3.1(L) 3.0(L)  Chloride 101 - 111 mmol/L 106 94(L) 93(L)  CO2 22 - 32 mmol/L 26 - 22  Calcium 8.9 - 10.3 mg/dL 8.3(L) - 9.4  Total Protein 6.5 - 8.1 g/dL 5.1(L) - 6.8  Total Bilirubin 0.3 - 1.2 mg/dL 0.8 - 1.0  Alkaline Phos 38 - 126 U/L 52 - 71  AST 15 - 41 U/L 15 - 19  ALT 14 - 54 U/L 7(L) - 10(L)   CBC Latest Ref Rng & Units 05/29/2016 05/28/2016 05/28/2016  WBC 4.0 - 10.5 K/uL 8.3 - 12.3(H)  Hemoglobin 12.0 - 15.0 g/dL 10.6(L) 15.3(H) 14.2  Hematocrit 36.0 - 46.0 % 33.6(L) 45.0 43.8  Platelets 150 - 400 K/uL 232 - 312   Studies/Results: Dg Chest Portable 1 View  Result Date: 05/28/2016 CLINICAL DATA:  Hematemesis. Family reported to EMS that Pt vomited blood all night. Pt went to [facility] on Sat. and was DC with a RX for Protonix. Hx of HTN and diabetes. Non-smoker. EXAM: PORTABLE  CHEST 1 VIEW COMPARISON:  4/10/8 FINDINGS: Cardiac silhouette is normal in size. No mediastinal or hilar masses. No evidence of adenopathy. Mild stable scarring at the right apex. Lungs otherwise clear. No pleural effusion. No pneumothorax. Skeletal structures are demineralized. There stable changes from a previous anterior cervical spine fusion. IMPRESSION: No acute cardiopulmonary disease. Electronically Signed   By: Lajean Manes M.D.   On: 05/28/2016 10:11   Procedures: EGD (05/29/16) - Normal larynx. - Medium-sized hiatal hernia. - LA Grade D reflux esophagitis. Biopsied. - Normal stomach. - Normal duodenal bulb, first portion of the duodenum, second portion of the duodenum and third portion of the duodenum. - The examination was otherwise normal.  Medications: I have reviewed the patient's current medications. Scheduled Meds: . amLODipine  5 mg Oral Daily  . insulin aspart  0-5 Units Subcutaneous QHS  . insulin aspart  0-9 Units Subcutaneous TID WC  . pantoprazole  40 mg Oral BID   Continuous Infusions: . sodium chloride     PRN Meds:.acetaminophen **OR**  acetaminophen, promethazine Assessment/Plan: Active Problems:   GI bleed   Upper GI bleeding   Pressure injury of skin  Mrs. Leamy is an 83 yo woman with a PMHx significant for atrial flutter, DM, HTN, hyperlipidemia, a recent PE in February, who presented to the ED with complaints of coffee ground vomiting and melena and was found to have reflux esophagitis and hiatal hernia on EGD.   UGI bleed:  The patient has has a hemoglobin of 10.6, down from 15.3 from yesterday.  She currently reports no N/V or melena.  She has been on eliquis for many years which may be associated with her initial bleeding.  EGD showed low grade reflux esophagitis, biopsy pending, with a normal stomach and duodenum.   It is possible her bleedings is distal to the duodenum, in the jejunum or ileum, though this is rare. Per uptodate: Lysbeth Galas lesions are  erosions or ulcers occurring in the sac of a hiatal hernia and can sometimes cause acute UGI bleeds. This might explain the bleeding. Plan: - advance diet as tolerated  -40 mg protonix BID -Normal saline at 100 mL per hour. -Phenergan 6.25 mg every 4 hourly when necessary-can be increased to 12.5 mg if needed.  AKI: The patients creatinine is now 1.39, up from 0.8 yesterday.   Plan: -increase to 100 mL per hour continuous IVF   Diabetes: Her most recent A1C was 5.9 (04/19/2016) She was on Levemir 8 units daily along with sliding scale at home. Her blood sugar during current admission ranges between 379-400. -CBC monitoring every 4 hourly. -aspart sliding scale   Hypertension.  She is on amlodipine 5 mg daily and Zestoretic 20-12 .5 daily at home. Plan: -amlodipine 5 mg daily to control HTN in hospital setting  -Hold Zestoretic  Atrial Flutter: The patient is on eliquis for atrial flutter which is not apparent on EKG, but is reported to have a long history of.    Plan: -Continue telemetry. -start back on eliquis today   Urinary retention.  She was found to have a distended bladder and urinary retention in March as well as a proreus mirabilis UTI.   She was discharged on Abx and foley but was found to have a postvoid residual volume of 1051mL after foley was removed so it was replaced and was present when she presented to the ED for this admission.   Plan:  -Check UA. -have patient follow with urology to determine future of foley/ if it needs to be replaced   CODE STATUS. Full according to patient. It was partial code prior. Need to be confirmed from her daughter. DVT prophylaxis. SCD's  Diet. advance as tolerated  Dispo: Admit patient to Observation with expected length of stay less than 2 midnights.  This is a Careers information officer Note.  The care of the patient was discussed with Dr. Reesa Chew and the assessment and plan formulated with their assistance.  Please see their  attached note for official documentation of the daily encounter.   LOS: 0 days   Brendolyn Patty, Medical Student 05/29/2016, 11:36 AM

## 2016-05-29 NOTE — Progress Notes (Signed)
Wyline Mood 10:02 AM  Subjective: Patient without further signs of bleeding and no pain and no other complaints hospital computer chart reviewed and case discussed with my partners  Objective: Vital signs stable afebrile no acute distress exam please see preassessment evaluation decreased hemoglobin other labs okay CTs reviewed  Assessment: Multiple medical problems including dysphasia or weight loss and seemingly self-limited hematemesis  Plan: I discussed endoscopy with the patient and my partner discussed on the phone with her daughter and will proceed this morning with further workup and plans pending those findings  [facility]  Pager [phone] After 5PM or if no answer call [phone]

## 2016-05-29 NOTE — Progress Notes (Signed)
Consent given by daughter [name] for EGD, last dose of Eliquis was on 05/25/16, daughter unable to come over today due to work but would like an update after procedure.

## 2016-05-29 NOTE — Discharge Summary (Signed)
Name: Sherry [name] MRN: [id number] DOB: [birth year] 83 y.o. PCP: [facility]  Date of Admission: 05/28/2016  8:44 AM Date of Discharge: 06/02/2016 Attending Physician: Annia Belt, MD  Discharge Diagnosis: 1. Upper GI bleed. 2. Hiatal hernia  3. Esophagitis  Discharge Medications: Allergies as of 06/02/2016   No Known Allergies     Medication List    TAKE these medications   amLODipine 5 MG tablet Commonly known as:  NORVASC Take 5 mg by mouth daily.   apixaban 5 MG Tabs tablet Commonly known as:  ELIQUIS Take 1 tablet (5 mg total) by mouth 2 (two) times daily. What changed:  how much to take   atorvastatin 20 MG tablet Commonly known as:  LIPITOR Take 20 mg by mouth at bedtime.   cefdinir 300 MG capsule Commonly known as:  OMNICEF Take 1 capsule (300 mg total) by mouth 2 (two) times daily.   diphenhydrAMINE 25 MG tablet Commonly known as:  BENADRYL Take 25 mg by mouth daily as needed for itching or allergies.   ferrous sulfate 325 (65 FE) MG tablet Take 1 tablet (325 mg total) by mouth daily with breakfast. Start taking on:  06/03/2016   gabapentin 300 MG capsule Commonly known as:  NEURONTIN Take 300 mg by mouth at bedtime.   Insulin Detemir 100 UNIT/ML Pen Commonly known as:  LEVEMIR Inject 8 Units into the skin every morning. What changed:  how much to take  when to take this   lisinopril-hydrochlorothiazide 20-12.5 MG tablet Commonly known as:  PRINZIDE,ZESTORETIC Take 2 tablets by mouth daily.   magnesium oxide 400 (241.3 Mg) MG tablet Commonly known as:  MAG-OX Take 1 tablet (400 mg total) by mouth daily.   meloxicam 7.5 MG tablet Commonly known as:  MOBIC Take 7.5 mg by mouth daily.   metoCLOPramide 5 MG tablet Commonly known as:  REGLAN Take 1 tablet (5 mg total) by mouth 2 (two) times daily.   oxyCODONE-acetaminophen 5-325 MG tablet Commonly known as:  PERCOCET/ROXICET Take 2 tablets by mouth 2 (two)  times daily.   OXYGEN Inhale 2 L into the lungs continuous.   pantoprazole 40 MG tablet Commonly known as:  PROTONIX Take 1 tablet (40 mg total) by mouth 2 (two) times daily. What changed:  when to take this  Another medication with the same name was removed. Continue taking this medication, and follow the directions you see here.   polyethylene glycol packet Commonly known as:  MIRALAX Take 17 g by mouth daily as needed.   potassium chloride SA 20 MEQ tablet Commonly known as:  K-DUR,KLOR-CON Take 1 tablet (20 mEq total) by mouth daily. What changed:  medication strength  how much to take   sertraline 25 MG tablet Commonly known as:  ZOLOFT Take 1 tablet (25 mg total) by mouth daily.   sucralfate 1 GM/10ML suspension Commonly known as:  CARAFATE Take 10 mLs (1 g total) by mouth 4 (four) times daily -  with meals and at bedtime.   tamsulosin 0.4 MG Caps capsule Commonly known as:  FLOMAX Take 1 capsule (0.4 mg total) by mouth daily. What changed:  when to take this       Disposition and follow-up:   Sherry [name] was discharged from [facility] in Good condition.  At the hospital follow up visit please address:  1.  According to a note that her daughter states that she was taking Eliquis 10 mg twice daily  since 05/14/2016. Please make sure that she will take her medications as prescribed. -She needs to follow-up with GI in 6 weeks and a repeat EGD in 2-3 month. -Her blood sugar and adjust her insulin dose accordingly. -We started her on Zoloft-please monitor her response. -She was having persistent hypokalemia, please check her potassium level regularly. -She needs to follow-up with urology for her Foley and the right renal mass.  2.  Labs / imaging needed at time of follow-up: BMP  3.  Pending labs/ test needing follow-up: None  Follow-up Appointments: Contact information for after-discharge care    [facility] .   Specialty:  [place] Contact information: [place] Hospital Course by problem list:  Sherry [name] a 83 y.o.elderly lady with past medical history significant for diabetes, hypertension, hyperlipidemia,atrial flutter on Eliquis ,recent pulmonary embolism in 02/2016 an incidental finding of right renal mass of unknown etiology came to ED with complaints of coffee-ground vomitus and melena since last night.  Hiatal hernia with reflux esophagitis. In ED she was noticed to have maroon-colored vomiting, her FOBT was negative. She underwent EGD which shows hiatal hernia with reflux esophagitis and few mucosal break in esophageal circumference, but no active bleeding found. Biopsy was taken and pathology results pending. She was advised to use Protonix 40 mg twice daily for 4 weeks. She will need to follow up with GI in 6 weeks and a repeat EGD in 2-3 months to document healing.  Electrolyte abnormalities. On presentation she was found to have mild hyponatremia, hypokalemia and hypomagnesemia. She did develop hypokalemia and hypomagnesemia later during her course in hospital. Electrolytes were replaced.  AKI. Her creatinine was elevated on presentation to 1.1 which bumped up to 1.39, most likely because of GI bleed and dehydration. Her AK I resolved with fluid resuscitation.  Diabetes. Her repeat A1c done on 05/28/2016 was 6.7. Her previous A1c done on 04/19/2016 was 5.9, and there was one documented A1c done by a [facility] physician in 02/10/2016 was 10.8. There is a lot of variation in  her A1c.  On presentation to ED she was having elevated blood sugars at 400. She was placed on sensitive sliding scale and her blood sugars responded appropriately. She was having mostly elevated CBGs, her Levemir was increased to 8 units and we kept her on SSI along with Levemir.  Hypertension.Her blood pressure  was elevated on presentation to ED. She was on amlodipine 5 mg daily and Zestoretic 20-12 .5 daily at home. She never took her home meds on the day of presentation. She became normotensive with amlodipine 5 mg daily, taper her blood pressure started going up and we slowly introduced lisinopril and hydrochlorothiazide. She can resume her home dose of amlodipine and Zestoretic on discharge.  History of atrial flutter. She has a long-standing history of atrial flutter, on Eliquis for many years according to patient. Her current EKG does not show any flutter waves. Initially her Eliquis was held because of possibility of active GI bleed. It was restarted once EGD did not found any active source of bleeding. Her daughter to hold of her pharmacist that she was taking 10 mg twice daily instead of 5 since 05/14/2016, which might be responsible for her current presentation. She was advised to take Eliquis as prescribed.  History of recent PE. She has an history of  PE in February 2018, while she was on Eliquis, which was cleared on subsequent imaging. She denies any dyspnea and saturating well on room air. She was advised to continue her Eliquis on  discharge at 5 mg twice daily.  HFrEF. Echo done in 04/20/2016 shows ejection fraction of 40-45%, mildly reduced from her previous echo done in January 2018. It was 45-50%. She denies any chest pain or shortness of breath. She was saturating well on room air. Chest x-ray was negative for any pulmonary congestion.  Right renal mass.During her admission on 03/09/2016 with chest pain CT abdomen was obtained which shows incidental finding of right renal pelvic mass. There was some concern for urothelial malignancy.She is following with [facility] urology. They think that she is not a good surgical candidate and they want to repeat CT urogram after 6 weeks of initial imaging, patient was unable to keep up with her appointment for CT urogram. According to daughter she has  an upcoming appointment next week with urology. She was advised to follow up with urology.  Urinary retention. During her admission in early March 2018 she was found to have distended bladder with urinary retention, also grew Proteus mirabilis on urinary culture which was treated with antibiotics. Foley was discontinued on discharge. During her visit to urology office on 04/06/2016 she was found to have more than 1000 cc of urine in her bladder. A Foley was placed with a plan to do voiding trials in 1-2 weeks. She missed her next follow-up. She was presented with Foley in place.""""  Discharge Vitals:   BP (!) 144/61 (BP Location: Right Arm)   Pulse 85   Temp 99.2 F (37.3 C)   Resp 20   SpO2 100%   Gen.Frail,emaciated elderly lady,in no acute distress, appears depressed. Lungs.Clear bilaterally. CV.Regular rate and rhythm. Abdomen.Soft, nontenderness, bowel sounds positive. Extremities.No edema, no cyanosis, pulses intact and symmetrical.  Pertinent Labs, Studies, and Procedures:   CMP     Component Value Date/Time   NA 134 (L) 06/02/2016 0553   NA 139 10/21/2012 1333   K 3.0 (L) 06/02/2016 0553   K 4.5 10/21/2012 1333   CL 98 (L) 06/02/2016 0553   CL 108 (H) 10/21/2012 1333   CO2 26 06/02/2016 0553   CO2 24 10/21/2012 1333   GLUCOSE 183 (H) 06/02/2016 0553   GLUCOSE 136 (H) 10/21/2012 1333   BUN 19 06/02/2016 0553   BUN 10 10/21/2012 1333   CREATININE 0.81 06/02/2016 0553   CREATININE 0.81 10/21/2012 1333   CALCIUM 8.5 (L) 06/02/2016 0553   CALCIUM 8.5 10/21/2012 1333   PROT 5.1 (L) 05/29/2016 0731   PROT 6.4 10/21/2012 1333   ALBUMIN 2.7 (L) 05/29/2016 0731   ALBUMIN 3.1 (L) 10/21/2012 1333   AST 15 05/29/2016 0731   AST 30 10/21/2012 1333   ALT 7 (L) 05/29/2016 0731   ALT 18 10/21/2012 1333   ALKPHOS 52 05/29/2016 0731   ALKPHOS 100 10/21/2012 1333   BILITOT 0.8 05/29/2016 0731   BILITOT 0.3 10/21/2012 1333   GFRNONAA >60 06/02/2016 0553   GFRNONAA  >60 10/21/2012 1333   GFRAA >60 06/02/2016 0553   GFRAA >60 10/21/2012 1333    Recent Labs Lab 05/31/16 0412 06/01/16 0527 06/02/16 0553  HGB 13.7 13.1 10.7*  HCT 42.3 38.8 33.4*  WBC 13.4* 11.9* 7.7  PLT 291 243 242    Recent Labs Lab 05/28/16 1219 05/29/16 4193 05/30/16 0703 05/31/16 7902 06/01/16 0527 06/02/16 0553 06/02/16 0728  NA  --  141 135 134* 132* 134*  --   K  --  4.1 3.3* 3.5 3.1* 3.0*  --   CL  --  106 101 94* 97* 98*  --   CO2  --  26 25 25 24 26   --   GLUCOSE  --  180* 213* 283* 249* 183*  --   BUN  --  17 12 11  23* 19  --   CREATININE  --  1.39* 0.96 1.00 0.93 0.81  --   CALCIUM  --  8.3* 8.2* 9.2 8.9 8.5*  --   MG 1.6* 2.4  --   --   --   --  1.5*   Urinalysis    Component Value Date/Time   COLORURINE YELLOW 05/28/2016 1519   APPEARANCEUR CLOUDY (A) 05/28/2016 1519   APPEARANCEUR Clear 10/21/2012 1333   LABSPEC 1.011 05/28/2016 1519   LABSPEC 1.006 10/21/2012 1333   PHURINE 5.0 05/28/2016 1519   GLUCOSEU NEGATIVE 05/28/2016 1519   GLUCOSEU 50 mg/dL 10/21/2012 1333   HGBUR SMALL (A) 05/28/2016 1519   BILIRUBINUR NEGATIVE 05/28/2016 1519   BILIRUBINUR Negative 10/21/2012 1333   KETONESUR 20 (A) 05/28/2016 1519   PROTEINUR NEGATIVE 05/28/2016 1519   NITRITE NEGATIVE 05/28/2016 1519   LEUKOCYTESUR LARGE (A) 05/28/2016 1519   LEUKOCYTESUR Negative 10/21/2012 1333    Culture, Urine  Order: [id number]  Status:  Final result Visible to patient:  No (Not Released) Next appt:  None   2d ago  Specimen Description URINE, RANDOM   Special Requests NONE   Culture MULTIPLE SPECIES PRESENT, SUGGEST RECOLLECTION          Iron/TIBC/Ferritin/ %Sat    Component Value Date/Time   IRON 17 (L) 05/29/2016 1401   TIBC 234 (L) 05/29/2016 1401   FERRITIN 90 05/29/2016 1401   IRONPCTSAT 7 (L) 05/29/2016 1401   Folate. 20.9 B12. 214 PT/INR. 13.5/1.02 FOBT. Negative Magnesium. 1.6  EKG: Sinus rhythm with First-degree heart andleft bundle branch  block, T-wave inversion in lead 1 and aVL,V 5 and V6-unchanged from her prior tracings.  QTc 513.  CXR: FINDINGS: Cardiac silhouette is normal in size. No mediastinal or hilar masses. No evidence of adenopathy.  Mild stable scarring at the right apex. Lungs otherwise clear. No pleural effusion. No pneumothorax.  Skeletal structures are demineralized. There stable changes from a previous anterior cervical spine fusion.  IMPRESSION: No acute cardiopulmonary disease.  Procedures: EGD (05/29/16) - Normal larynx. - Medium-sized hiatal hernia. - LA Grade D reflux esophagitis. Biopsied. - Normal stomach. - Normal duodenal bulb, first portion of the duodenum, second portion of the duodenum and third portion of the duodenum. - The examination was otherwise normal.  Esophagus, biopsy. - NECROINFLAMMATORY DEBRIS - SEE COMMENT Microscopic Comment The biopsy is of necroinflammatory debris and crushed tissue with a cellular infiltrate of undetermined significance. PAS stain is negative for fungal organisms. Recommend repeat biopsy; if clinically indicated.   Discharge Instructions: Discharge Instructions    Diet - low sodium heart healthy    Complete by:  As directed    Increase activity slowly    Complete by:  As directed       Signed: Lorella Nimrod, MD 06/02/2016, 1:32 PM   Pager: [phone]

## 2016-05-29 NOTE — Progress Notes (Signed)
Medicine attending: I examined this patient today together with resident physician Dr. Rosiland Oz and I concur with her evaluation and management plan which we discussed together.  Please see separate attending admission note for complete details. Endoscopy done for unexplained dysphagia, hematemesis, and an abnormal CT scan with thickened distal esophagus shows hiatus hernia with reflux but no other abnormalities.  She is still nauseous.  We will continue overnight observation, hydration, and Protonix.  Anticipate discharge tomorrow.

## 2016-05-29 NOTE — Op Note (Signed)
[facility] Patient Name: Sherry [name] Procedure Date : 05/29/2016 MRN: [id number] Attending MD: Clarene Essex , MD Date of Birth: 09-15-1927 CSN: [id number] Age: 83 Admit Type: Inpatient Procedure:                Upper GI endoscopy Indications:              Dysphagia, Hematemesis Providers:                Clarene Essex, MD, Cleda Daub, RN, Cletis Athens,                            Technician, Alan Mulder, Technician, Phill Myron.                            Proofreader, CRNA Referring MD:              Medicines:                Propofol total dose 50 mg IV Complications:            No immediate complications. Estimated Blood Loss:     Estimated blood loss: none. Procedure:                Pre-Anesthesia Assessment:                           - Prior to the procedure, a History and Physical                            was performed, and patient medications and                            allergies were reviewed. [name] tolerance of                            previous anesthesia was also reviewed. The risks                            and benefits of the procedure and the sedation                            options and risks were discussed with the patient.                            All questions were answered, and informed consent                            was obtained. Prior Anticoagulants: The patient has                            taken Eliquis (apixaban), last dose was 3 days                            prior to procedure. ASA Grade Assessment: III - A  patient with severe systemic disease. After                            reviewing the risks and benefits, the patient was                            deemed in satisfactory condition to undergo the                            procedure.                           After obtaining informed consent, the endoscope was                            passed under direct vision. Throughout the           procedure, the patient's blood pressure, pulse, and                            oxygen saturations were monitored continuously. The                            Endoscope was introduced through the mouth, and                            advanced to the third part of duodenum. The upper                            GI endoscopy was accomplished without difficulty.                            The patient tolerated the procedure well. Scope In: Scope Out: Findings:      The larynx was normal.      A medium-sized hiatal hernia was present.      LA Grade D (one or more mucosal breaks involving at least 75% of       esophageal circumference) esophagitis with no bleeding was found. 2       Biopsies were taken Of distal esophagus with a cold forceps for       histology.      The entire examined stomach was normal.      The duodenal bulb, first portion of the duodenum, second portion of the       duodenum and third portion of the duodenum were normal.      The exam was otherwise without abnormality. Impression:               - Normal larynx.                           - Medium-sized hiatal hernia.                           - LA Grade D reflux esophagitis. Biopsied.                           - Normal  stomach.                           - Normal duodenal bulb, first portion of the                            duodenum, second portion of the duodenum and third                            portion of the duodenum.                           - The examination was otherwise normal. Moderate Sedation:      moderate sedation-none Recommendation:           - Patient has a contact number available for                            emergencies. The signs and symptoms of potential                            delayed complications were discussed with the                            patient. Return to normal activities tomorrow.                            Written discharge instructions were provided to the                             patient.                           - Full liquid diet - advance as tolerated to resume                            regular diet today.                           - Continue present medications.                           - Use Protonix (pantoprazole) 40 mg PO BID for 4                            weeks. then decrease to once a day long-term                           - Resume Eliquis (apixaban) at prior dose tomorrow                            if she still requires blood thinners.                           - Await pathology results.                           -  Return to GI clinic in 6 weeks. consider repeat                            endoscopy in 2-3 months to document healing                           - Telephone GI clinic for pathology results in 1                            week.                           - Telephone GI clinic if symptomatic PRN. Procedure Code(s):        --- Professional ---                           [phone], Esophagogastroduodenoscopy, flexible,                            transoral; with biopsy, single or multiple Diagnosis Code(s):        --- Professional ---                           K44.9, Diaphragmatic hernia without obstruction or                            gangrene                           K21.0, Gastro-esophageal reflux disease with                            esophagitis                           R13.10, Dysphagia, unspecified                           K92.0, Hematemesis CPT copyright 2016 [employer]. All rights reserved. The codes documented in this report are preliminary and upon coder review may  be revised to meet current compliance requirements. Clarene Essex, MD 05/29/2016 11:06:03 AM This report has been signed electronically. Number of Addenda: 0

## 2016-05-29 NOTE — Care Management Obs Status (Signed)
[place] NOTIFICATION   Patient Details  Name: Sherry [name] MRN: [id number] Date of Birth: Jun 19, 1927   Medicare Observation Status Notification Given:  Yes    Erenest Rasher, RN 05/29/2016, 4:46 PM

## 2016-05-29 NOTE — Anesthesia Postprocedure Evaluation (Signed)
Anesthesia Post Note  Patient: Sherry [name]  Procedure(s) Performed: Procedure(s) (LRB): ESOPHAGOGASTRODUODENOSCOPY (EGD) (N/A)  Patient location during evaluation: PACU Anesthesia Type: MAC Level of consciousness: awake and alert Pain management: pain level controlled Vital Signs Assessment: post-procedure vital signs reviewed and stable Respiratory status: spontaneous breathing, nonlabored ventilation, respiratory function stable and patient connected to nasal cannula oxygen Cardiovascular status: stable and blood pressure returned to baseline Anesthetic complications: no       Last Vitals:  Vitals:   05/29/16 1252 05/29/16 1253  BP: (!) 180/49 136/69  Pulse: 79 83  Resp: 17   Temp: 36.9 C     Last Pain:  Vitals:   05/29/16 1252  TempSrc: Oral                 Mechel Schutter DAVID

## 2016-05-29 NOTE — Progress Notes (Signed)
Subjective:  Patient was still complaining of mild nausea, did not had any vomiting since last night. She denies any abdominal pain. She did not had any bowel movements overnight.  Objective:  Vital signs in last 24 hours: Vitals:   05/29/16 1119 05/29/16 1205 05/29/16 1252 05/29/16 1253  BP: (!) 182/50 (!) 180/66 (!) 180/49 136/69  Pulse: 78 80 79 83  Resp: 16 20 17    Temp:  98.8 F (37.1 C) 98.4 F (36.9 C)   TempSrc:  Oral Oral   SpO2: 100% 100% 100%    Gen. Well-developed, frail elderly lady, in no acute distress. Abdomen. Soft, nontender, bowel sounds positive. Lungs. Clear bilaterally. CV. Regular rate and rhythm. Extremities. No edema, no cyanosis, pulses intact and symmetrical.  Labs. CBC Latest Ref Rng & Units 05/29/2016 05/28/2016 05/28/2016  WBC 4.0 - 10.5 K/uL 8.3 - 12.3(H)  Hemoglobin 12.0 - 15.0 g/dL 10.6(L) 15.3(H) 14.2  Hematocrit 36.0 - 46.0 % 33.6(L) 45.0 43.8  Platelets 150 - 400 K/uL 232 - 312   CMP Latest Ref Rng & Units 05/29/2016 05/28/2016 05/28/2016  Glucose 65 - 99 mg/dL 180(H) 400(H) 379(H)  BUN 6 - 20 mg/dL 17 13 12   Creatinine 0.44 - 1.00 mg/dL 1.39(H) 0.80 1.10(H)  Sodium 135 - 145 mmol/L 141 135 133(L)  Potassium 3.5 - 5.1 mmol/L 4.1 3.1(L) 3.0(L)  Chloride 101 - 111 mmol/L 106 94(L) 93(L)  CO2 22 - 32 mmol/L 26 - 22  Calcium 8.9 - 10.3 mg/dL 8.3(L) - 9.4  Total Protein 6.5 - 8.1 g/dL 5.1(L) - 6.8  Total Bilirubin 0.3 - 1.2 mg/dL 0.8 - 1.0  Alkaline Phos 38 - 126 U/L 52 - 71  AST 15 - 41 U/L 15 - 19  ALT 14 - 54 U/L 7(L) - 10(L)   Magnesium. 2.4  Assessment/Plan:  Sherry [name] a 83 y.o. elderly lady with past medical history significant for diabetes, hypertension, hyperlipidemia, atrial flutter on Eliquis , recent pulmonary embolism in 02/2016 an incidental finding of right renal mass of unknown etiology came to ED with complaints of coffee-ground vomitus and melena since last night.  Upper GI bleed. She had her EGD today which  shows hiatal hernia with reflux esophagitis and few mucosal break in esophageal circumference, but no active bleeding found. She did drop her hemoglobin today to 10.6, it's very close to her baseline, yesterday's reading was most likely hemo concentrated. -Start her on liquid diet and progress as tolerated. -Protonix 40 mg twice daily for 4 weeks. -She will follow up with GI in 6 weeks. -GI will call the patient with pathology results. -GI was recommending repeat EGD in 2-3 months to document healing. -Anemia panel-she can be placed on iron supplement if needed.  Electrolyte abnormalities. Resolved after replacement today.  AKI.  Her creatinine worsened today to 1.39. -IV normal saline 100 mL per hour. -Repeat BMP tomorrow morning.  Diabetes. A1c done yesterday was 6.7,Her blood sugar improved today. She was given total of 10 units over the period of 24 hours since yesterday. We will add basal insulin once she will start tolerating food. -Continue SSI.  Hypertension. She was normotensive today. -Continue amlodipine 5 mg daily. -We will keep holding Zestoretic.  History of atrial flutter. Currently in sinus rhythm. -Eliquis will be restarted from tonight.  History of recent PE. No current concerns. She will be restarted on her Eliquis tonight.  HFrEF. No sign of volume overload. Gentle IV fluids and watch for any pulmonary congestion.  Right renal mass/urinary retention. She is following up with urology and have an other upcoming appointment next week. UA was ordered yesterday which has not been collected yet. We reminded the nurse again today. She is on Foley since 04/06/2016.  Dispo: Anticipated discharge in approximately 1 day(s).   Lorella Nimrod, MD 05/29/2016, 1:23 PM Pager: [phone]

## 2016-05-29 NOTE — Progress Notes (Addendum)
[facility] Gastroenterology Progress Note  Sherry [name] 83 y.o. July 14, 1927  CC:  Coffee-ground emesis   Subjective: Patient with no acute issues overnight. Discussed with the nursing staff. No reported incidence of coffee-ground emesis or black color stool overnight or this morning. Patient remains somewhat confused. She does not remember what medications she takes or when was her last dose of Eliquis.   ROS : Difficult to ascertain but she denied any active chest pain or difficulty breathing. Denied any fever or chills.   Objective: Vital signs in last 24 hours: Vitals:   05/29/16 0506 05/29/16 0836  BP: (!) 114/95 (!) 140/46  Pulse: 77   Resp: 18   Temp: 99.2 F (37.3 C)     Physical Exam:  General:  Alert But somewhat confused   Head:  Normocephalic, without obvious abnormality, atraumatic  Eyes:  , EOM's intact,   Lungs:   Clear to auscultation bilaterally, respirations unlabored. Anterior exam only   Heart:  Regular rate and rhythm, S1, S2 normal  Abdomen:   Soft, non-tender, bowel sounds active all four quadrants,  no masses,   Extremities: Extremities normal, atraumatic, trace  edema    Lab Results:  Recent Labs  05/28/16 0903 05/28/16 0921 05/28/16 1219 05/29/16 0731  NA 133* 135  --  141  K 3.0* 3.1*  --  4.1  CL 93* 94*  --  106  CO2 22  --   --  26  GLUCOSE 379* 400*  --  180*  BUN 12 13  --  17  CREATININE 1.10* 0.80  --  1.39*  CALCIUM 9.4  --   --  8.3*  MG  --   --  1.6* 2.4    Recent Labs  05/28/16 0903 05/29/16 0731  AST 19 15  ALT 10* 7*  ALKPHOS 71 52  BILITOT 1.0 0.8  PROT 6.8 5.1*  ALBUMIN 3.7 2.7*    Recent Labs  05/27/16 1324 05/28/16 0903 05/28/16 0921 05/29/16 0731  WBC 8.4 12.3*  --  8.3  NEUTROABS 6.6  --   --   --   HGB 12.5 14.2 15.3* 10.6*  HCT 38.9 43.8 45.0 33.6*  MCV 84.9 83.6  --  84.8  PLT 269 312  --  232    Recent Labs  05/28/16 0903 05/29/16 0731  LABPROT 13.5 14.0  INR 1.02 1.08       Assessment/Plan: - Coffee-ground emesis and melena with h/o dysphagia since last few months. Need to rule out esophagitis/esophageal stricture/esophageal mass. - Acute blood loss anemia. Hemoglobin down to 10.6.  -History of atrial flutter. Last dose of Eliquis  on 05/25/2016 according to daughter. - History of pulmonary embolism in February 2018.  Recommendations ------------------------- -  CT can on 04/19/2016 showed marked circumferential wall thickening and edema in the distal esophagus along with the distal paraesophageal lymphadenopathy. It also showed multiple scattered small subcapsular lesion in the right lobe of the liver. - EGD today for further evaluation. Consent has been obtained with daughter.Last dose of Eliquis  on 05/25/2016 according to daughter. - Further plan based on endoscopic finding.   Otis Brace MD, Index 05/29/2016, 9:24 AM  Pager [phone]  If no answer or after 5 PM call [phone]

## 2016-05-29 NOTE — Transfer of Care (Signed)
Immediate Anesthesia Transfer of Care Note  Patient: Sherry [name]  Procedure(s) Performed: Procedure(s): ESOPHAGOGASTRODUODENOSCOPY (EGD) (N/A)  Patient Location: Endoscopy Unit  Anesthesia Type:MAC  Level of Consciousness: sedated  Airway & Oxygen Therapy: Patient Spontanous Breathing and Patient connected to nasal cannula oxygen  Post-op Assessment: Report given to RN, Post -op Vital signs reviewed and stable and Patient moving all extremities X 4  Post vital signs: Reviewed and stable  Last Vitals:  Vitals:   05/29/16 0836 05/29/16 0953  BP: (!) 140/46 (!) 140/36  Pulse:  83  Resp:  (!) 21  Temp:  37.1 C    Last Pain:  Vitals:   05/29/16 0953  TempSrc: Oral         Complications: No apparent anesthesia complications

## 2016-05-30 ENCOUNTER — Ambulatory Visit: Payer: Medicare Other | Admitting: Urology

## 2016-05-30 ENCOUNTER — Encounter: Payer: Self-pay | Admitting: Urology

## 2016-05-30 ENCOUNTER — Telehealth: Payer: Self-pay | Admitting: Urology

## 2016-05-30 DIAGNOSIS — K922 Gastrointestinal hemorrhage, unspecified: Secondary | ICD-10-CM | POA: Diagnosis not present

## 2016-05-30 DIAGNOSIS — I110 Hypertensive heart disease with heart failure: Secondary | ICD-10-CM | POA: Diagnosis not present

## 2016-05-30 DIAGNOSIS — E876 Hypokalemia: Secondary | ICD-10-CM

## 2016-05-30 DIAGNOSIS — K449 Diaphragmatic hernia without obstruction or gangrene: Secondary | ICD-10-CM | POA: Diagnosis not present

## 2016-05-30 DIAGNOSIS — N3090 Cystitis, unspecified without hematuria: Secondary | ICD-10-CM

## 2016-05-30 LAB — CBC WITH DIFFERENTIAL/PLATELET
Basophils Absolute: 0 10*3/uL (ref 0.0–0.1)
Basophils Relative: 0 %
Eosinophils Absolute: 0 10*3/uL (ref 0.0–0.7)
Eosinophils Relative: 0 %
HCT: 34.6 % — ABNORMAL LOW (ref 36.0–46.0)
Hemoglobin: 11 g/dL — ABNORMAL LOW (ref 12.0–15.0)
Lymphocytes Relative: 42 %
Lymphs Abs: 3.1 10*3/uL (ref 0.7–4.0)
MCH: 27.2 pg (ref 26.0–34.0)
MCHC: 31.8 g/dL (ref 30.0–36.0)
MCV: 85.6 fL (ref 78.0–100.0)
Monocytes Absolute: 0.4 10*3/uL (ref 0.1–1.0)
Monocytes Relative: 6 %
Neutro Abs: 3.9 10*3/uL (ref 1.7–7.7)
Neutrophils Relative %: 52 %
Platelets: 208 10*3/uL (ref 150–400)
RBC: 4.04 MIL/uL (ref 3.87–5.11)
RDW: 15.1 % (ref 11.5–15.5)
WBC: 7.5 10*3/uL (ref 4.0–10.5)

## 2016-05-30 LAB — BASIC METABOLIC PANEL
Anion gap: 9 (ref 5–15)
BUN: 12 mg/dL (ref 6–20)
CO2: 25 mmol/L (ref 22–32)
Calcium: 8.2 mg/dL — ABNORMAL LOW (ref 8.9–10.3)
Chloride: 101 mmol/L (ref 101–111)
Creatinine, Ser: 0.96 mg/dL (ref 0.44–1.00)
GFR calc Af Amer: >60 mL/min (ref >60)
GFR calc non Af Amer: 52 mL/min — ABNORMAL LOW (ref >60)
Glucose, Bld: 213 mg/dL — ABNORMAL HIGH (ref 65–99)
Potassium: 3.3 mmol/L — ABNORMAL LOW (ref 3.5–5.1)
Sodium: 135 mmol/L (ref 135–145)

## 2016-05-30 LAB — GLUCOSE, CAPILLARY
GLUCOSE-CAPILLARY: 108 mg/dL — AB (ref 65–99)
Glucose-Capillary: 189 mg/dL — ABNORMAL HIGH (ref 65–99)
Glucose-Capillary: 246 mg/dL — ABNORMAL HIGH (ref 65–99)
Glucose-Capillary: 308 mg/dL — ABNORMAL HIGH (ref 65–99)

## 2016-05-30 MED ORDER — INSULIN DETEMIR 100 UNIT/ML ~~LOC~~ SOLN
6.0000 [IU] | Freq: Every day | SUBCUTANEOUS | Status: DC
  Administered 2016-05-31 (×2): 6 [IU] via SUBCUTANEOUS
  Filled 2016-05-30 (×2): qty 0.06

## 2016-05-30 MED ORDER — POTASSIUM CHLORIDE 10 MEQ/100ML IV SOLN
10.0000 meq | INTRAVENOUS | Status: AC
  Administered 2016-05-30 (×4): 10 meq via INTRAVENOUS
  Filled 2016-05-30 (×4): qty 100

## 2016-05-30 MED ORDER — LISINOPRIL 20 MG PO TABS
20.0000 mg | ORAL_TABLET | Freq: Every day | ORAL | Status: DC
  Administered 2016-05-30 – 2016-06-02 (×3): 20 mg via ORAL
  Filled 2016-05-30 (×4): qty 1

## 2016-05-30 MED ORDER — FERROUS SULFATE 325 (65 FE) MG PO TABS
325.0000 mg | ORAL_TABLET | Freq: Every day | ORAL | Status: DC
  Administered 2016-06-02: 325 mg via ORAL
  Filled 2016-05-30 (×3): qty 1

## 2016-05-30 NOTE — Progress Notes (Signed)
[facility] Gastroenterology Progress Note  Sherry [name] 83 y.o. 1927/02/09  CC:  Coffee-ground emesis   Subjective:   Patient had episodes of nausea yesterday which is resolved at this time. Tolerating full liquid diet. Denied further bleeding episodes.   Objective: Vital signs in last 24 hours: Vitals:   05/29/16 2123 05/30/16 0555  BP: (!) 148/55 (!) 152/52  Pulse: 71 77  Resp: 20   Temp:  98.8 F (37.1 C)    Physical Exam:  General:  Alert , No acute distress . Receiving oxygen by nasal cannula   Head:  Normocephalic, without obvious abnormality, atraumatic  Eyes:  , EOM's intact,   Lungs:   Decreased air entry respirations unlabored. Anterior exam only   Heart:  Regular rate and rhythm, S1, S2 normal  Abdomen:   Soft, non-tender, bowel sounds active all four quadrants,  no masses,   Extremities: Extremities normal, atraumatic, trace  edema    Lab Results:  Recent Labs  05/28/16 1219 05/29/16 0731 05/30/16 0703  NA  --  141 135  K  --  4.1 3.3*  CL  --  106 101  CO2  --  26 25  GLUCOSE  --  180* 213*  BUN  --  17 12  CREATININE  --  1.39* 0.96  CALCIUM  --  8.3* 8.2*  MG 1.6* 2.4  --     Recent Labs  05/28/16 0903 05/29/16 0731  AST 19 15  ALT 10* 7*  ALKPHOS 71 52  BILITOT 1.0 0.8  PROT 6.8 5.1*  ALBUMIN 3.7 2.7*    Recent Labs  05/27/16 1324  05/29/16 0731 05/30/16 0703  WBC 8.4  < > 8.3 7.5  NEUTROABS 6.6  --   --  3.9  HGB 12.5  < > 10.6* 11.0*  HCT 38.9  < > 33.6* 34.6*  MCV 84.9  < > 84.8 85.6  PLT 269  < > 232 208  < > = values in this interval not displayed.  Recent Labs  05/28/16 0903 05/29/16 0731  LABPROT 13.5 14.0  INR 1.02 1.08      Assessment/Plan: - Coffee-ground emesis and melena with h/o dysphagia since last few months. EGD yesterday showed LA  grade D esophagitis. - Acute blood loss anemia. Hemoglobin stable. No further overt bleeding. -History of atrial flutter. Last dose of Eliquis  on 05/25/2016 according to  daughter. - History of pulmonary embolism in February 2018.  Recommendations ------------------------- -  EGD yesterday showed LA grade D esophagitis. No evidence of active bleeding. - Continue twice a day PPI for 4 weeks and then decrease to once a day. - Ok  resume anticoagulation from GI standpoint. - Follow-up with Dr. Watt Climes  in 4 weeks after discharge. - Need for repeat endoscopy to document healing will be decided based on outpatient follow-up. Patient is a 83 year old and has multiple comorbidities. - Okay to discharge from GI standpoint. GI will sign off. Call us back if needed.   Otis Brace MD, [place] 05/30/2016, 10:23 AM  Pager [phone]  If no answer or after 5 PM call [phone]

## 2016-05-30 NOTE — Progress Notes (Signed)
Subjective: The patient was more alert this morning and reported feeling better compared to yesterday.  She reports 1 bout of coffee ground emesis yesterday afternoon but no vomiting since then, though she continues to have mild nausea this morning.  Yesterday she was able to drink a sprite and tolerate it but did not feel comfortable trying any other foods.  She reports no dysphagia with liquids.  She still has not had a BM, and does not feel constipated.  She reports no dyspnea, pain or GERD symptoms.  She feels tired but attributes that to being in the hospital with people coming in and out of the room.    Objective: Vital signs in last 24 hours: Vitals:   05/29/16 1252 05/29/16 1253 05/29/16 2123 05/30/16 0555  BP: (!) 180/49 136/69 (!) 148/55 (!) 152/52  Pulse: 79 83 71 77  Resp: 17  20   Temp: 98.4 F (36.9 C)   98.8 F (37.1 C)  TempSrc: Oral     SpO2: 100%  100% 100%  .urin   Intake/Output Summary (Last 24 hours) at 05/30/16 0748 Last data filed at 05/30/16 0653  Gross per 24 hour  Intake          1301.67 ml  Output              300 ml  Net          1001.67 ml   General appearance: cooperative and no distress Lungs: clear to auscultation bilaterally Heart: regular rate and rhythm, S1, S2 normal, no murmur, click, rub or gallop Abdomen: soft, mildly tender in the suprapubic region, bowel sounds normal, no masses, no organomegaly  Neurologic: Alert and oriented X 3, normal strength and tone. Normal symmetric reflexes. Normal coordination and gait Lab Results: CMP Latest Ref Rng & Units 05/30/2016 05/29/2016 05/28/2016  Glucose 65 - 99 mg/dL 213(H) 180(H) 400(H)  BUN 6 - 20 mg/dL 12 17 13   Creatinine 0.44 - 1.00 mg/dL 0.96 1.39(H) 0.80  Sodium 135 - 145 mmol/L 135 141 135  Potassium 3.5 - 5.1 mmol/L 3.3(L) 4.1 3.1(L)  Chloride 101 - 111 mmol/L 101 106 94(L)  CO2 22 - 32 mmol/L 25 26 -  Calcium 8.9 - 10.3 mg/dL 8.2(L) 8.3(L) -  Total Protein 6.5 - 8.1 g/dL - 5.1(L) -    Total Bilirubin 0.3 - 1.2 mg/dL - 0.8 -  Alkaline Phos 38 - 126 U/L - 52 -  AST 15 - 41 U/L - 15 -  ALT 14 - 54 U/L - 7(L) -   CBC Latest Ref Rng & Units 05/30/2016 05/29/2016 05/28/2016  WBC 4.0 - 10.5 K/uL 7.5 8.3 -  Hemoglobin 12.0 - 15.0 g/dL 11.0(L) 10.6(L) 15.3(H)  Hematocrit 36.0 - 46.0 % 34.6(L) 33.6(L) 45.0  Platelets 150 - 400 K/uL 208 232 -   Urinalysis    Component Value Date/Time   COLORURINE YELLOW 05/28/2016 1519   APPEARANCEUR CLOUDY (A) 05/28/2016 1519   APPEARANCEUR Clear 10/21/2012 1333   LABSPEC 1.011 05/28/2016 1519   LABSPEC 1.006 10/21/2012 1333   PHURINE 5.0 05/28/2016 1519   GLUCOSEU NEGATIVE 05/28/2016 1519   GLUCOSEU 50 mg/dL 10/21/2012 1333   HGBUR SMALL (A) 05/28/2016 1519   BILIRUBINUR NEGATIVE 05/28/2016 1519   BILIRUBINUR Negative 10/21/2012 1333   KETONESUR 20 (A) 05/28/2016 1519   PROTEINUR NEGATIVE 05/28/2016 1519   NITRITE NEGATIVE 05/28/2016 1519   LEUKOCYTESUR LARGE (A) 05/28/2016 1519   LEUKOCYTESUR Negative 10/21/2012 1333  Bacteria-Many Squamous Cells - (0-5)  Iron/TIBC/Ferritin/ %Sat    Component Value Date/Time   IRON 17 (L) 05/29/2016 1401   TIBC 234 (L) 05/29/2016 1401   FERRITIN 90 05/29/2016 1401   IRONPCTSAT 7 (L) 05/29/2016 1401  Folate: 20.9 (normal) B12: 214 (normal)   Ref Range & Units 1d ago  Retic Ct Pct 0.4 - 3.1 % 1.1   RBC. 3.87 - 5.11 MIL/uL 4.48   Retic Count, Manual 19.0 - 186.0 K/uL 49.3      Studies/Results: Dg Chest Portable 1 View  Result Date: 05/28/2016 CLINICAL DATA:  Hematemesis. Family reported to EMS that Pt vomited blood all night. Pt went to [facility] on Sat. and was DC with a RX for Protonix. Hx of HTN and diabetes. Non-smoker. EXAM: PORTABLE CHEST 1 VIEW COMPARISON:  4/10/8 FINDINGS: Cardiac silhouette is normal in size. No mediastinal or hilar masses. No evidence of adenopathy. Mild stable scarring at the right apex. Lungs otherwise clear. No pleural effusion. No pneumothorax. Skeletal  structures are demineralized. There stable changes from a previous anterior cervical spine fusion. IMPRESSION: No acute cardiopulmonary disease. Electronically Signed   By: Lajean Manes M.D.   On: 05/28/2016 10:11   Procedures: EGD (05/29/16) - Normal larynx. - Medium-sized hiatal hernia. - LA Grade D reflux esophagitis. Biopsied. - Normal stomach. - Normal duodenal bulb, first portion of the duodenum, second portion of the duodenum and third portion of the duodenum. - The examination was otherwise normal.  Medications: I have reviewed the patient's current medications. Scheduled Meds: . amLODipine  5 mg Oral Daily  . apixaban  5 mg Oral BID  . atorvastatin  20 mg Oral QHS  . insulin aspart  0-5 Units Subcutaneous QHS  . insulin aspart  0-9 Units Subcutaneous TID WC  . pantoprazole  40 mg Oral BID   Continuous Infusions: . cefTRIAXone (ROCEPHIN)  IV Stopped (05/29/16 1854)  . dextrose 5 % and 0.45% NaCl 100 mL/hr at 05/30/16 0437  . potassium chloride     PRN Meds:.acetaminophen **OR** acetaminophen, promethazine Assessment/Plan: Active Problems:   GI bleed   Upper GI bleeding   Pressure injury of skin   HH (hiatus hernia)   Hiatal hernia with gastroesophageal reflux disease without esophagitis  [name] is an 83 yo woman with a PMHx significant for atrial flutter, DM, HTN, hyperlipidemia, a recent PE in February, who presented to the ED with complaints of coffee ground vomiting and melena and was found to have reflux esophagitis and hiatal hernia on EGD.   UGI bleed:  The patient has has a hemoglobin of 11.0 up from 10.6 yesterday and continues to report coffee ground emesis.   She has been on eliquis for many years which may be associated with her initial bleeding.  EGD showed low grade reflux esophagitis, biopsy pending, with a normal stomach and duodenum.   It is possible her bleedings is distal to the duodenum, in the jejunum or ileum, though this is rare. Per uptodate:  Lysbeth Galas lesions are erosions or ulcers occurring in the sac of a hiatal hernia and can sometimes cause acute UGI bleeds. This might explain the bleeding. Plan: -advance diet as tolerated  -40 mg protonix BID -Normal saline at 100 mL per hour. -Phenergan 6.25 mg every 4 hourly when necessary-can be increased to 12.5 mg if needed. -follow GI rec on how to proceed with hiatal hernia/ esophagitis -f/u on biopsy  AKI: The patients creatinine is now 0.96, down from 1.39 yesterday.   Plan: - 100 mL D5  1/2NS per hour continuous IVF   Diabetes: Her most recent A1C was 5.9 (04/19/2016) She was on Levemir 8 units daily along with sliding scale at home. Her blood sugar today is 213.   -CBC monitoring  -aspart sliding scale   Hypertension  She is on amlodipine 5 mg daily and Zestoretic 20-12 .5 daily at home. Plan: -amlodipine 5 mg daily to control HTN in hospital setting  -Hold Zestoretic  Hyperlipidemia: On atorvastatin 20mg  daily.  Last lipid panel showed in April was normal.    Hypokalemia: K+ this AM was 3.3 down from 4.1.  Likely secondary to vomiting.   Plan: -replete with 40 mEq KCl IV   Atrial Flutter: The patient is on eliquis for atrial flutter which is not apparent on EKG, but is reported to have a long history of.    Plan: -Continue telemetry. -continue eliquis   Urinary retention She was found to have a distended bladder and urinary retention in March as well as a proreus mirabilis UTI.   She was discharged on Abx and foley but was found to have a postvoid residual volume of 1052mL after foley was removed so it was replaced and was present when she presented to the ED for this admission.  UA came back positive but may be contaminated.  However she portrays suprapubic tenderness consistent with a UTI. It is very possible that her Upper GI bleed is 2/2 this UTI.   Plan:  -repeat UA, send for UCx -Keep on ceftriaxone till UCx comes back -have patient follow with urology  to determine future of foley/ if it needs to be replaced   CODE STATUS. Full according to patient. It was partial code prior. Need to be confirmed from her daughter. DVT prophylaxis. SCD's  Diet. advance as tolerated  Dispo: Admit patient to Observation with expected length of stay less than 2 midnights.  This is a Careers information officer Note.  The care of the patient was discussed with Dr. Reesa Chew and the assessment and plan formulated with their assistance.  Please see their attached note for official documentation of the daily encounter.   LOS: 0 days   Brendolyn Patty, Medical Student 05/30/2016, 7:48 AM

## 2016-05-30 NOTE — Telephone Encounter (Signed)
This patient needs to be contacted by radiology to schedule her CT urogram as previously recommended and sent a certified letter to follow-up.  Hollice Espy, MD

## 2016-05-30 NOTE — Evaluation (Signed)
Physical Therapy Evaluation Patient Details Name: Sherry [name] MRN: [id number] DOB: December 23, 1927 Today's Date: 05/30/2016   History of Present Illness  Patient is an 83 yo female admitted 05/28/16  with hematemesis/coffee-ground emesis for possible GIB.     PMH:  DM, HTN, HLD, Aflutter, recent PE, Rt renal mass, CHF.  Clinical Impression  Patient presents with problems listed below.  Will benefit from acute PT to maximize functional mobility prior to discharge.  Patient with decreased strength, balance, and cognition impacting mobility/gait/safety.  Recommend SNF at discharge for continued therapy.    Follow Up Recommendations SNF;Supervision/Assistance - 24 hour    Equipment Recommendations  None recommended by PT    Recommendations for Other Services       Precautions / Restrictions Precautions Precautions: Fall Restrictions Weight Bearing Restrictions: No      Mobility  Bed Mobility Overal bed mobility: Needs Assistance Bed Mobility: Supine to Sit;Sit to Supine     Supine to sit: Mod assist;HOB elevated Sit to supine: Mod assist;HOB elevated   General bed mobility comments: Patient required mod assist to move LE's off of bed and to raise trunk to sitting.  Patient with very flexed posture in sitting.  Required close min guard for safety.  Patient fatigued quickly, and abruptly leaned to rest on [facility] which was still elevated.  Patient declined further mobility, reporting feeling nauseated.  Returned to supine with mod assist.  Transfers                 General transfer comment: Declined  Ambulation/Gait                Stairs            Wheelchair Mobility    Modified Rankin (Stroke Patients Only)       Balance Overall balance assessment: Needs assistance Sitting-balance support: Single extremity supported;Feet supported Sitting balance-Leahy Scale: Poor                                       Pertinent Vitals/Pain Pain  Assessment: No/denies pain    Home Living Family/patient expects to be discharged to:: Skilled nursing facility Living Arrangements: Children (Daughter)             Home Equipment: Gilford Rile - 2 wheels;Hospital bed;Shower seat;Wheelchair - manual;Bedside commode      Prior Function Level of Independence: Needs assistance   Gait / Transfers Assistance Needed: Pt reports she ambulates limited household distances with rollater  ADL's / Homemaking Assistance Needed: Patient states she does her own bath.  Unsure of prior assist needed.        Hand Dominance        Extremity/Trunk Assessment   Upper Extremity Assessment Upper Extremity Assessment: Defer to OT evaluation    Lower Extremity Assessment Lower Extremity Assessment: Generalized weakness    Cervical / Trunk Assessment Cervical / Trunk Assessment: Kyphotic  Communication   Communication: HOH;Expressive difficulties (Appears to use incorrect words at times.)  Cognition Arousal/Alertness: Awake/alert Behavior During Therapy: Flat affect Overall Cognitive Status: No family/caregiver present to determine baseline cognitive functioning                                 General Comments: Patient disoriented to place and situation.  Increased time to follow commands.  Decreased safety awareness.  General Comments      Exercises     Assessment/Plan    PT Assessment Patient needs continued PT services  PT Problem List Decreased strength;Decreased activity tolerance;Decreased balance;Decreased mobility;Decreased cognition;Decreased knowledge of use of DME;Decreased safety awareness;Cardiopulmonary status limiting activity       PT Treatment Interventions DME instruction;Gait training;Functional mobility training;Therapeutic activities;Therapeutic exercise;Balance training;Cognitive remediation;Patient/family education    PT Goals (Current goals can be found in the Care Plan section)  Acute Rehab  PT Goals Patient Stated Goal: None stated PT Goal Formulation: With patient Time For Goal Achievement: 06/13/16 Potential to Achieve Goals: Fair    Frequency Min 2X/week   Barriers to discharge        Co-evaluation               AM-PAC PT "6 Clicks" Daily Activity  Outcome Measure Difficulty turning over in bed (including adjusting bedclothes, sheets and blankets)?: Total Difficulty moving from lying on back to sitting on the side of the bed? : Total Difficulty sitting down on and standing up from a chair with arms (e.g., wheelchair, bedside commode, etc,.)?: Total Help needed moving to and from a bed to chair (including a wheelchair)?: A Lot Help needed walking in hospital room?: A Lot Help needed climbing 3-5 steps with a railing? : A Lot 6 Click Score: 9    End of Session Equipment Utilized During Treatment: Oxygen Activity Tolerance: Patient limited by fatigue Patient left: in bed;with call bell/phone within reach;with SCD's reapplied (Rails up and pads next to bed.)   PT Visit Diagnosis: Other abnormalities of gait and mobility (R26.89);Muscle weakness (generalized) (M62.81)    Time: 4097-3532 PT Time Calculation (min) (ACUTE ONLY): 10 min   Charges:   PT Evaluation $PT Eval Moderate Complexity: 1 Procedure     PT G Codes:   PT G-Codes **NOT FOR INPATIENT CLASS** Functional Assessment Tool Used: AM-PAC 6 Clicks Basic Mobility;Clinical judgement Functional Limitation: Mobility: Walking and moving around Mobility: Walking and Moving Around Current Status (D9242): At least 60 percent but less than 80 percent impaired, limited or restricted Mobility: Walking and Moving Around Goal Status [phone]): At least 20 percent but less than 40 percent impaired, limited or restricted    Carita Pian. Sanjuana Kava, [facility] Acute Rehab Services Pager [place] 05/30/2016, 10:38 PM

## 2016-05-30 NOTE — Care Management Note (Signed)
Case Management Note  Patient Details  Name: Sherry [name] MRN: [id number] Date of Birth: 09-Jun-1927  Subjective/Objective:    UGI, hiatal hernia, UTI, Hypokalemia, HTN, AKI                Action/Plan: Discharge Planning: NCM spoke to pt and gave permission to speak to [facility] Robinson [phone]. Contacted dtr via phone. Dtr is requesting SNF rehab. Waiting PT/OT recommendations for home. CSW referral for SNF placement. Dtr requesting [facility] SNF in [place].  Attending notified of [place], [place] 05/31/2016. Explained ABN paperwork to dtr via phone. She verbalized understanding.  PCP [facility]    Expected Discharge Date:  06/02/16               Expected Discharge Plan:  Skilled Nursing Facility  In-House Referral:  Clinical Social Work  Discharge planning Services  CM Consult  Post Acute Care Choice:  NA Choice offered to:  NA  DME Arranged:  N/A DME Agency:  NA  HH Arranged:  NA HH Agency:  NA  Status of Service:  Completed, signed off  If discussed at [employer] of Stay Meetings, dates discussed:    Additional Comments:  Erenest Rasher, RN 05/30/2016, 4:43 PM

## 2016-05-30 NOTE — Progress Notes (Signed)
Medicine attending: I examined this patient today and I concur with the evaluation and management that will be detailed in subsequent note by resident physician Dr.Sumayya Amin. She is feeling better today with resolution of nausea.  Starting to take some p.o. liquids.  Abdomen is soft and nontender in the upper quadrants but she does have suprapubic tenderness.  Foley catheter in place.  Hemoglobin remains stable.  Borderline hypokalemia and potassium will be replaced. Urine culture results pending.  She is stable on Rocephin. 1.  Hemorrhagic gastritis, with associated nausea and vomiting resolved 2.  Likely urinary tract infection as etiology of her symptoms 3.  Possible right renal pelvis mass status post placement of Foley catheter with ongoing urologic follow-up planned after discharge.

## 2016-05-30 NOTE — Telephone Encounter (Signed)
Pt was a no show for new patient appt today.  She was coming for a right kidney mass pt released from [facility] and may need her catheter removed just fyi.

## 2016-05-30 NOTE — Progress Notes (Signed)
Subjective: Patient was feeling better when seen this morning. She denies anymore nausea or vomiting. She was able to tolerate some liquid diet and ice cream very well.  Objective:  Vital signs in last 24 hours: Vitals:   05/29/16 1252 05/29/16 1253 05/29/16 2123 05/30/16 0555  BP: (!) 180/49 136/69 (!) 148/55 (!) 152/52  Pulse: 79 83 71 77  Resp: 17  20   Temp: 98.4 F (36.9 C)   98.8 F (37.1 C)  TempSrc: Oral     SpO2: 100%  100% 100%   Gen.Frailelderly lady,in no acute distress. Abdomen. Soft, suprapubic tenderness, bowel sounds positive. Lungs. Clear bilaterally. CV. Regular rate and rhythm. Extremities. No edema, no cyanosis, pulses intact and symmetrical.  Labs. CBC Latest Ref Rng & Units 05/30/2016 05/29/2016 05/28/2016  WBC 4.0 - 10.5 K/uL 7.5 8.3 -  Hemoglobin 12.0 - 15.0 g/dL 11.0(L) 10.6(L) 15.3(H)  Hematocrit 36.0 - 46.0 % 34.6(L) 33.6(L) 45.0  Platelets 150 - 400 K/uL 208 232 -   BMP Latest Ref Rng & Units 05/30/2016 05/29/2016 05/28/2016  Glucose 65 - 99 mg/dL 213(H) 180(H) 400(H)  BUN 6 - 20 mg/dL 12 17 13   Creatinine 0.44 - 1.00 mg/dL 0.96 1.39(H) 0.80  Sodium 135 - 145 mmol/L 135 141 135  Potassium 3.5 - 5.1 mmol/L 3.3(L) 4.1 3.1(L)  Chloride 101 - 111 mmol/L 101 106 94(L)  CO2 22 - 32 mmol/L 25 26 -  Calcium 8.9 - 10.3 mg/dL 8.2(L) 8.3(L) -    Assessment/Plan:  Sherry [name] a 83 y.o.elderly lady with past medical history significant for diabetes, hypertension, hyperlipidemia,atrial flutter on Eliquis ,recent pulmonary embolism in 02/2016 an incidental finding of right renal mass of unknown etiology came to ED with complaints of coffee-ground vomitus and melena since last night.  Upper GI bleed. Self-limited, it has been dissolved, hemoglobin stable.  Hiatal hernia with reflux esophagitis. Patient denies anymore nausea and vomiting. Her nausea and vomiting along with leukocytosis might be multifactorial due to her esophagitis and UTI  both. -Advance diet as tolerated. -Continue Protonix 40 mg twice daily for 4 weeks. -Follow-up care GI as advised.  UTI. She is high risk because of Foley for last 6 weeks. Urine culture results are pending. -Continue ceftriaxone and we will narrow antibiotics according to sensitivity results. -Change Foleys today.  Hypokalemia. She was again hypokalemic with potassium of 3.3 this morning. -Replace potassium with KCl 40 mEq. -Repeat BMP in the morning.  Hypertension.  Her blood pressure was elevated today. -We will add her home dose of lisinopril 20 mg daily -Continue amlodipine 5 mg daily -We'll keep holding hydrochlorothiazide.  AKI. Her creatinine is back to her baseline, it was 0.96 today.  Iron deficiency anemia. Her anemia panel shows iron deficiency anemia with a ferritin of 90. It might be either due to some element of anemia of chronic illness or ferritin being evaluated as an acute phase reactant. -Start her on iron supplement.  Diabetes. Her CBGs remained between 189-308. As she is pain able to tolerate by mouth intake we will add her basal insulin And continue with sliding scale.  History of atrial flutter. Currently in sinus rhythm. -Continue  Eliquis  History of recent PE. No current concerns. -Continue  Eliquis  HFrEF. No sign of volume overload. -Discontinue IV fluids as patient is able to tolerate by mouth intake.  Right renal mass/urinary retention. She is following up with urology and have an other upcoming appointment next week. She is on Foley since  04/06/2016. -Change Foley today.  Dispo: Anticipated discharge in approximately 1 day(s).   Lorella Nimrod, MD 05/30/2016, 1:30 PM Pager: [phone]

## 2016-05-30 NOTE — Progress Notes (Signed)
Inpatient Diabetes Program Recommendations  AACE/ADA: New Consensus Statement on Inpatient Glycemic Control (2015)  Target Ranges:  Prepandial:   less than 140 mg/dL      Peak postprandial:   less than 180 mg/dL (1-2 hours)      Critically ill patients:  140 - 180 mg/dL   Lab Results  Component Value Date   GLUCAP 308 (H) 05/30/2016   HGBA1C 6.7 (H) 05/28/2016    Review of Glycemic Control  Blood sugars elevated. Home meds: Levemir 8 units QD  Inpatient Diabetes Program Recommendations:   Add Levemir 8 units Q24H.  Will follow. Thank you. Lorenda Peck, RD, LDN, CDE Inpatient Diabetes Coordinator [phone]

## 2016-05-31 DIAGNOSIS — E1122 Type 2 diabetes mellitus with diabetic chronic kidney disease: Secondary | ICD-10-CM | POA: Diagnosis present

## 2016-05-31 DIAGNOSIS — E1165 Type 2 diabetes mellitus with hyperglycemia: Secondary | ICD-10-CM | POA: Diagnosis present

## 2016-05-31 DIAGNOSIS — I130 Hypertensive heart and chronic kidney disease with heart failure and stage 1 through stage 4 chronic kidney disease, or unspecified chronic kidney disease: Secondary | ICD-10-CM | POA: Diagnosis present

## 2016-05-31 DIAGNOSIS — I4892 Unspecified atrial flutter: Secondary | ICD-10-CM | POA: Diagnosis present

## 2016-05-31 DIAGNOSIS — N189 Chronic kidney disease, unspecified: Secondary | ICD-10-CM | POA: Diagnosis present

## 2016-05-31 DIAGNOSIS — Z86711 Personal history of pulmonary embolism: Secondary | ICD-10-CM | POA: Diagnosis not present

## 2016-05-31 DIAGNOSIS — K209 Esophagitis, unspecified without bleeding: Secondary | ICD-10-CM | POA: Diagnosis present

## 2016-05-31 DIAGNOSIS — E876 Hypokalemia: Secondary | ICD-10-CM | POA: Diagnosis present

## 2016-05-31 DIAGNOSIS — Z79899 Other long term (current) drug therapy: Secondary | ICD-10-CM | POA: Diagnosis not present

## 2016-05-31 DIAGNOSIS — K210 Gastro-esophageal reflux disease with esophagitis: Secondary | ICD-10-CM | POA: Diagnosis present

## 2016-05-31 DIAGNOSIS — Z7901 Long term (current) use of anticoagulants: Secondary | ICD-10-CM | POA: Diagnosis not present

## 2016-05-31 DIAGNOSIS — F329 Major depressive disorder, single episode, unspecified: Secondary | ICD-10-CM | POA: Diagnosis present

## 2016-05-31 DIAGNOSIS — N2889 Other specified disorders of kidney and ureter: Secondary | ICD-10-CM | POA: Diagnosis present

## 2016-05-31 DIAGNOSIS — D62 Acute posthemorrhagic anemia: Secondary | ICD-10-CM | POA: Diagnosis present

## 2016-05-31 DIAGNOSIS — K920 Hematemesis: Secondary | ICD-10-CM | POA: Diagnosis present

## 2016-05-31 DIAGNOSIS — K922 Gastrointestinal hemorrhage, unspecified: Secondary | ICD-10-CM | POA: Diagnosis not present

## 2016-05-31 DIAGNOSIS — D509 Iron deficiency anemia, unspecified: Secondary | ICD-10-CM | POA: Diagnosis present

## 2016-05-31 DIAGNOSIS — R739 Hyperglycemia, unspecified: Secondary | ICD-10-CM

## 2016-05-31 DIAGNOSIS — K449 Diaphragmatic hernia without obstruction or gangrene: Secondary | ICD-10-CM | POA: Diagnosis present

## 2016-05-31 DIAGNOSIS — E785 Hyperlipidemia, unspecified: Secondary | ICD-10-CM | POA: Diagnosis present

## 2016-05-31 DIAGNOSIS — L8990 Pressure ulcer of unspecified site, unspecified stage: Secondary | ICD-10-CM | POA: Diagnosis present

## 2016-05-31 DIAGNOSIS — N3090 Cystitis, unspecified without hematuria: Secondary | ICD-10-CM | POA: Diagnosis present

## 2016-05-31 DIAGNOSIS — E860 Dehydration: Secondary | ICD-10-CM | POA: Diagnosis present

## 2016-05-31 DIAGNOSIS — K921 Melena: Secondary | ICD-10-CM | POA: Diagnosis present

## 2016-05-31 DIAGNOSIS — N179 Acute kidney failure, unspecified: Secondary | ICD-10-CM | POA: Diagnosis present

## 2016-05-31 DIAGNOSIS — Z794 Long term (current) use of insulin: Secondary | ICD-10-CM | POA: Diagnosis not present

## 2016-05-31 DIAGNOSIS — I5020 Unspecified systolic (congestive) heart failure: Secondary | ICD-10-CM | POA: Diagnosis present

## 2016-05-31 LAB — URINE CULTURE

## 2016-05-31 LAB — BASIC METABOLIC PANEL
Anion gap: 15 (ref 5–15)
BUN: 11 mg/dL (ref 6–20)
CO2: 25 mmol/L (ref 22–32)
Calcium: 9.2 mg/dL (ref 8.9–10.3)
Chloride: 94 mmol/L — ABNORMAL LOW (ref 101–111)
Creatinine, Ser: 1 mg/dL (ref 0.44–1.00)
GFR calc Af Amer: 57 mL/min — ABNORMAL LOW (ref >60)
GFR calc non Af Amer: 50 mL/min — ABNORMAL LOW (ref >60)
Glucose, Bld: 283 mg/dL — ABNORMAL HIGH (ref 65–99)
Potassium: 3.5 mmol/L (ref 3.5–5.1)
Sodium: 134 mmol/L — ABNORMAL LOW (ref 135–145)

## 2016-05-31 LAB — CBC
HCT: 42.3 % (ref 36.0–46.0)
Hemoglobin: 13.7 g/dL (ref 12.0–15.0)
MCH: 27.2 pg (ref 26.0–34.0)
MCHC: 32.4 g/dL (ref 30.0–36.0)
MCV: 83.9 fL (ref 78.0–100.0)
Platelets: 291 10*3/uL (ref 150–400)
RBC: 5.04 MIL/uL (ref 3.87–5.11)
RDW: 14.5 % (ref 11.5–15.5)
WBC: 13.4 10*3/uL — ABNORMAL HIGH (ref 4.0–10.5)

## 2016-05-31 LAB — GLUCOSE, CAPILLARY
GLUCOSE-CAPILLARY: 221 mg/dL — AB (ref 65–99)
Glucose-Capillary: 221 mg/dL — ABNORMAL HIGH (ref 65–99)
Glucose-Capillary: 258 mg/dL — ABNORMAL HIGH (ref 65–99)
Glucose-Capillary: 272 mg/dL — ABNORMAL HIGH (ref 65–99)
Glucose-Capillary: 294 mg/dL — ABNORMAL HIGH (ref 65–99)

## 2016-05-31 MED ORDER — METOCLOPRAMIDE HCL 10 MG PO TABS: 5.0000 mg | ORAL_TABLET | Freq: Two times a day (BID) | ORAL | Status: DC

## 2016-05-31 MED ORDER — METOCLOPRAMIDE HCL 5 MG/ML IJ SOLN
5.0000 mg | Freq: Two times a day (BID) | INTRAMUSCULAR | Status: DC
  Administered 2016-05-31 – 2016-06-01 (×3): 5 mg via INTRAVENOUS
  Filled 2016-05-31 (×3): qty 2

## 2016-05-31 MED ORDER — PANTOPRAZOLE SODIUM 40 MG IV SOLR
40.0000 mg | Freq: Two times a day (BID) | INTRAVENOUS | Status: DC
  Administered 2016-05-31 – 2016-06-02 (×4): 40 mg via INTRAVENOUS
  Filled 2016-05-31 (×5): qty 40

## 2016-05-31 MED ORDER — POTASSIUM CHLORIDE IN NACL 20-0.9 MEQ/L-% IV SOLN
INTRAVENOUS | Status: DC
  Administered 2016-06-01 – 2016-06-02 (×2): via INTRAVENOUS
  Filled 2016-05-31 (×4): qty 1000

## 2016-05-31 MED ORDER — SODIUM CHLORIDE 0.9 % IV SOLN
INTRAVENOUS | Status: DC
  Administered 2016-05-31: 15:00:00 via INTRAVENOUS

## 2016-05-31 NOTE — Telephone Encounter (Signed)
Called and left a message for her daughter Sherry [name] to cb to reschedule the follow up app and to let her know that she needs to get the CT scan scheduled first.  [name]

## 2016-05-31 NOTE — Progress Notes (Signed)
Subjective: The patient started vomiting dark liquid yesterday afternoon and continues to report suprapubic pain as well as a new onset of back pain. She did not notice any proceeding events to her vomiting such as eating/ drinking.  She is somnolent and did not respond to all of our questions.     Objective: Vital signs in last 24 hours: Vitals:   05/30/16 0555 05/30/16 1356 05/30/16 2205 05/31/16 0618  BP: (!) 152/52 (!) 170/65 (!) 178/71 (!) 142/61  Pulse: 77 76 91 95  Resp:  20 (!) 22 17  Temp: 98.8 F (37.1 C) 97.9 F (36.6 C) 99.1 F (37.3 C) 97.7 F (36.5 C)  TempSrc:  Oral  Oral  SpO2: 100% 100% 99% 100%    Intake/Output Summary (Last 24 hours) at 05/31/16 1129 Last data filed at 05/31/16 0900  Gross per 24 hour  Intake          1746.25 ml  Output             2300 ml  Net          -553.75 ml   General appearance: fatigued and moderate distress Lungs: clear to auscultation bilaterally Heart: regular rate and rhythm, S1, S2 normal, no murmur, click, rub or gallop Abdomen: soft, and markedly tender in the suprapubic region, bowel sounds were difficult to hear, no masses, no organomegaly  Neurologic: Grossly normal Lab Results: CMP Latest Ref Rng & Units 05/31/2016 05/30/2016 05/29/2016  Glucose 65 - 99 mg/dL 283(H) 213(H) 180(H)  BUN 6 - 20 mg/dL 11 12 17   Creatinine 0.44 - 1.00 mg/dL 1.00 0.96 1.39(H)  Sodium 135 - 145 mmol/L 134(L) 135 141  Potassium 3.5 - 5.1 mmol/L 3.5 3.3(L) 4.1  Chloride 101 - 111 mmol/L 94(L) 101 106  CO2 22 - 32 mmol/L 25 25 26   Calcium 8.9 - 10.3 mg/dL 9.2 8.2(L) 8.3(L)  Total Protein 6.5 - 8.1 g/dL - - 5.1(L)  Total Bilirubin 0.3 - 1.2 mg/dL - - 0.8  Alkaline Phos 38 - 126 U/L - - 52  AST 15 - 41 U/L - - 15  ALT 14 - 54 U/L - - 7(L)   CBC Latest Ref Rng & Units 05/31/2016 05/30/2016 05/29/2016  WBC 4.0 - 10.5 K/uL 13.4(H) 7.5 8.3  Hemoglobin 12.0 - 15.0 g/dL 13.7 11.0(L) 10.6(L)  Hematocrit 36.0 - 46.0 % 42.3 34.6(L) 33.6(L)  Platelets  150 - 400 K/uL 291 208 232   Urinalysis    Component Value Date/Time   COLORURINE YELLOW 05/28/2016 1519   APPEARANCEUR CLOUDY (A) 05/28/2016 1519   APPEARANCEUR Clear 10/21/2012 1333   LABSPEC 1.011 05/28/2016 1519   LABSPEC 1.006 10/21/2012 1333   PHURINE 5.0 05/28/2016 1519   GLUCOSEU NEGATIVE 05/28/2016 1519   GLUCOSEU 50 mg/dL 10/21/2012 1333   HGBUR SMALL (A) 05/28/2016 1519   BILIRUBINUR NEGATIVE 05/28/2016 1519   BILIRUBINUR Negative 10/21/2012 1333   KETONESUR 20 (A) 05/28/2016 1519   PROTEINUR NEGATIVE 05/28/2016 1519   NITRITE NEGATIVE 05/28/2016 1519   LEUKOCYTESUR LARGE (A) 05/28/2016 1519   LEUKOCYTESUR Negative 10/21/2012 1333  Bacteria-Many Squamous Cells - (0-5)  Urine Culture: Multiple species  Procedures: EGD (05/29/16) - Normal larynx. - Medium-sized hiatal hernia. - LA Grade D reflux esophagitis. Biopsied. - Normal stomach. - Normal duodenal bulb, first portion of the duodenum, second portion of the duodenum and third portion of the duodenum. - The examination was otherwise normal.  Medications: I have reviewed the patient's current medications. Scheduled Meds: . amLODipine  5 mg Oral Daily  . apixaban  5 mg Oral BID  . atorvastatin  20 mg Oral QHS  . ferrous sulfate  325 mg Oral Q breakfast  . insulin aspart  0-5 Units Subcutaneous QHS  . insulin aspart  0-9 Units Subcutaneous TID WC  . insulin detemir  6 Units Subcutaneous QHS  . lisinopril  20 mg Oral Daily  . pantoprazole  40 mg Oral BID   Continuous Infusions: . cefTRIAXone (ROCEPHIN)  IV Stopped (05/30/16 1719)   PRN Meds:.acetaminophen **OR** acetaminophen, promethazine Assessment/Plan: Active Problems:   GI bleed   Upper GI bleeding   Pressure injury of skin   HH (hiatus hernia)   Hiatal hernia with gastroesophageal reflux disease without esophagitis   Hypokalemia   Cystitis  Sherry [name] is an 83 yo woman with a PMHx significant for atrial flutter, DM, HTN, hyperlipidemia, a  recent PE in February, who presented to the ED with complaints of coffee ground vomiting and melena and was found to have reflux esophagitis and hiatal hernia on EGD.   Vomiting/GI bleed:  The patient has started vomiting again without a clear proceeding event.  It is possible her vomiting is secondary to an unresolved UTI or possibly a reaction to the ceftriaxone she is taking for the UTI.  Her vomit was fairly dark looking which makes me more concerned about a continuous upper GI bleed that has not resolved.  I am still worried about a cameron lesion as a possible cause of this vomiting.   Plan: -consider tagged RBC scan or CT angio if vomiting has not resolved by tomorrow  -advance diet as tolerated  -40 mg protonix BID -Phenergan 12.5 mg every 4 hours when necessary -f/u on biopsy  AKI: The patients creatinine is now 1.00, up from 0.96 yesterday.   Plan: - monitor  Diabetes: Her most recent A1C was 5.9 (04/19/2016) She was on Levemir 8 units daily along with sliding scale at home. Her blood sugar today is 213.   -CBC monitoring  -aspart sliding scale   Hypertension  She is on amlodipine 5 mg daily and Zestoretic 20-12 .5 daily at home. Plan: -amlodipine 5 mg daily to control HTN in hospital setting  -Hold Zestoretic  Hyperlipidemia: On atorvastatin 20mg  daily.  Last lipid panel showed in April was normal.    Hypokalemia: K+ this AM was normal at 3.5 up from 3.3.  Likely secondary to vomiting.   Plan: -monitor and replete with 40 mEq KCl IV as needed during stay  Atrial Flutter: The patient is on eliquis for atrial flutter which is not apparent on EKG, but is reported to have a long history of.    Plan: -Continue telemetry. -continue eliquis   Urinary retention She was found to have a distended bladder and urinary retention in March as well as a proreus mirabilis UTI.   She was discharged on Abx and foley but was found to have a postvoid residual volume of 104mL  after foley was removed so it was replaced and was present when she presented to the ED for this admission.  UA came back positive but may be contaminated.  Urine culture had multiple species grow. She portrays suprapubic tenderness consistent with a UTI. It is very possible that her Upper GI bleed is 2/2 this UTI.   Plan:  -continue treating with ceftriaxone -have patient follow with urology to determine future of foley/ if it needs to be replaced   CODE STATUS. Full according to patient.  It was partial code prior. Need to be confirmed from her daughter. DVT prophylaxis. SCD's  Diet. advance as tolerated  Dispo: Admit patient to Observation with expected length of stay less than 2 midnights.  This is a Careers information officer Note.  The care of the patient was discussed with Dr. Reesa Chew and the assessment and plan formulated with their assistance.  Please see their attached note for official documentation of the daily encounter.   LOS: 0 days   Sherry [name], Medical Student 05/31/2016, 11:29 AM

## 2016-05-31 NOTE — NC FL2 (Signed)
[place] LEVEL OF CARE SCREENING TOOL     IDENTIFICATION  Patient Name: Sherry [name] Birthdate: [birth year] Sex: female Admission Date (Current Location): 05/28/2016  [facility] and [place] Number:  Engineering geologist and Address:  [facility] [place], [place] [address] [place], [place] [address]      Provider Number: [id number]  Attending Physician Name and Address:  Annia Belt, MD  Relative Name and Phone Number:  [name], daughter, [phone]    Current Level of Care: Hospital Recommended Level of Care: [place] Prior Approval Number:    Date Approved/Denied:   PASRR Number: [id number]  Discharge Plan: SNF    Current Diagnoses: Patient Active Problem List   Diagnosis Date Noted  . Hypokalemia   . Cystitis   . HH (hiatus hernia)   . Hiatal hernia with gastroesophageal reflux disease without esophagitis   . GI bleed 05/28/2016  . Upper GI bleeding 05/28/2016  . Pressure injury of skin 05/28/2016  . Atrial flutter ([place])   . Gastroenteritis 04/24/2016  . Aphasia 04/18/2016  . Nausea & vomiting 03/27/2016  . Advance care planning   . UTI (urinary tract infection) 03/12/2016  . Chest pain 03/09/2016  . PE (pulmonary thromboembolism) ([place]) 02/26/2016  . Diabetes mellitus, type 2 ([place]) 02/26/2016  . Anemia 02/26/2016  . Chronic kidney disease (CKD), stage III (moderate) 02/26/2016  . Essential hypertension 02/26/2016  . Goals of care, counseling/discussion   . Adult failure to thrive syndrome   . Palliative care by specialist   . Sepsis ([place]) 11/26/2014    Orientation RESPIRATION BLADDER Height & Weight     Self, Time, Situation, Place  O2 (Nasal cannula 2L) Continent, Indwelling catheter Weight:   Height:     BEHAVIORAL SYMPTOMS/MOOD NEUROLOGICAL BOWEL NUTRITION STATUS      Continent Diet (Please see DC Summary)  AMBULATORY STATUS COMMUNICATION OF NEEDS Skin   Extensive Assist Verbally PU Stage and  Appropriate Care (Stage II pressure injury on sacrum)                       Personal Care Assistance Level of Assistance  Bathing, Feeding, Dressing Bathing Assistance: Limited assistance Feeding assistance: Independent Dressing Assistance: Limited assistance     Functional Limitations Info             SPECIAL CARE FACTORS FREQUENCY  PT (By licensed PT)     PT Frequency: 5x/week              Contractures      Additional Factors Info  Code Status, Allergies, Insulin Sliding Scale Code Status Info: Full Allergies Info: NKA   Insulin Sliding Scale Info: 3x daily with meals and at bedtime       Current Medications (05/31/2016):  This is the current hospital active medication list Current Facility-Administered Medications  Medication Dose Route Frequency Provider Last Rate Last Dose  . acetaminophen (TYLENOL) tablet 650 mg  650 mg Oral Q6H PRN Lorella Nimrod, MD       Or  . acetaminophen (TYLENOL) suppository 650 mg  650 mg Rectal Q6H PRN Lorella Nimrod, MD      . amLODipine (NORVASC) tablet 5 mg  5 mg Oral Daily Lorella Nimrod, MD   5 mg at 05/30/16 4268  . apixaban (ELIQUIS) tablet 5 mg  5 mg Oral BID Lorella Nimrod, MD   5 mg at 05/30/16 3419  . atorvastatin (LIPITOR) tablet 20 mg  20  mg Oral Connye Burkitt, MD   20 mg at 05/29/16 2155  . cefTRIAXone (ROCEPHIN) 1 g in dextrose 5 % 50 mL IVPB  1 g Intravenous Q24H Otilio Miu, [place]   Stopped at 05/30/16 1719  . ferrous sulfate tablet 325 mg  325 mg Oral Q breakfast Lorella Nimrod, MD      . insulin aspart (novoLOG) injection 0-5 Units  0-5 Units Subcutaneous QHS Lorella Nimrod, MD   2 Units at 05/31/16 0032  . insulin aspart (novoLOG) injection 0-9 Units  0-9 Units Subcutaneous TID WC Lorella Nimrod, MD   7 Units at 05/30/16 1223  . insulin detemir (LEVEMIR) injection 6 Units  6 Units Subcutaneous QHS Lorella Nimrod, MD   6 Units at 05/31/16 0033  . lisinopril (PRINIVIL,ZESTRIL) tablet 20 mg  20 mg Oral Daily  Lorella Nimrod, MD   20 mg at 05/30/16 1420  . pantoprazole (PROTONIX) EC tablet 40 mg  40 mg Oral BID Clarene Essex, MD   40 mg at 05/30/16 3704  . promethazine (PHENERGAN) injection 12.5 mg  12.5 mg Intravenous Q6H PRN Minus Liberty, MD   12.5 mg at 05/31/16 8889     Discharge Medications: Please see discharge summary for a list of discharge medications.  Relevant Imaging Results:  Relevant Lab Results:   Additional Information SSN:  [id number]  Benard Halsted, LCSWA

## 2016-05-31 NOTE — Progress Notes (Addendum)
Subjective: Patient was not feeling better since this morning. She was having coffee-ground emesis again since night.  Objective:  Vital signs in last 24 hours: Vitals:   05/30/16 0555 05/30/16 1356 05/30/16 2205 05/31/16 0618  BP: (!) 152/52 (!) 170/65 (!) 178/71 (!) 142/61  Pulse: 77 76 91 95  Resp:  20 (!) 22 17  Temp: 98.8 F (37.1 C) 97.9 F (36.6 C) 99.1 F (37.3 C) 97.7 F (36.5 C)  TempSrc:  Oral  Oral  SpO2: 100% 100% 99% 100%   Gen.Frailelderly lady,looks uncomfortable. Lungs.Clear bilaterally. CV.Regular rate and rhythm. Abdomen.Soft, suprapubic tenderness, bowel sounds positive. Extremities.No edema, no cyanosis, pulses intact and symmetrical.  Labs. CBC Latest Ref Rng & Units 05/31/2016 05/30/2016 05/29/2016  WBC 4.0 - 10.5 K/uL 13.4(H) 7.5 8.3  Hemoglobin 12.0 - 15.0 g/dL 13.7 11.0(L) 10.6(L)  Hematocrit 36.0 - 46.0 % 42.3 34.6(L) 33.6(L)  Platelets 150 - 400 K/uL 291 208 232   BMP Latest Ref Rng & Units 05/31/2016 05/30/2016 05/29/2016  Glucose 65 - 99 mg/dL 283(H) 213(H) 180(H)  BUN 6 - 20 mg/dL 11 12 17   Creatinine 0.44 - 1.00 mg/dL 1.00 0.96 1.39(H)  Sodium 135 - 145 mmol/L 134(L) 135 141  Potassium 3.5 - 5.1 mmol/L 3.5 3.3(L) 4.1  Chloride 101 - 111 mmol/L 94(L) 101 106  CO2 22 - 32 mmol/L 25 25 26   Calcium 8.9 - 10.3 mg/dL 9.2 8.2(L) 8.3(L)   Culture, Urine  Order: [id number]  Status:  Final result Visible to patient:  No (Not Released) Next appt:  None   2d ago  Specimen Description URINE, RANDOM   Special Requests NONE   Culture MULTIPLE SPECIES PRESENT, SUGGEST RECOLLECTION         Esophagus, biopsy.  - NECROINFLAMMATORY DEBRIS - SEE COMMENT Microscopic Comment The biopsy is of necroinflammatory debris and crushed tissue with a cellular infiltrate of undetermined significance. PAS stain is negative for fungal organisms. Recommend repeat biopsy; if clinically indicated.  Assessment/Plan:  Sherry Seier Pooleis a 83 y.o.elderly lady  with past medical history significant for diabetes, hypertension, hyperlipidemia,atrial flutter on Eliquis ,recent pulmonary embolism in 02/2016 an incidental finding of right renal mass of unknown etiology came to ED with complaints of coffee-ground vomitus and melena since last night.  Coffee-ground emesis. Unsure etiology, she had a EGD 2 days ago which does not show any bleeding source. She might be having some gastroparesis because of her history of diabetes. -We will start her on Reglan 5 mg twice daily. -Continue Phenergan IV when necessary. -IV NS at 75 mL per hour as she is unable to take any by mouth. -We can advance diet as tolerated later on. -Repeat CBC in the morning.  Hiatal hernia with reflux esophagitis.  May be contributory to her current nausea and vomiting. -Continue Protonix 40 mg twice daily for 4 weeks. -Follow up with GI as directed.  UTI.  Urine culture shows multiple organisms, most likely contamination. She is already on ceftriaxone. She remained afebrile with mild leukocytosis today. -Continue ceftriaxone-might need to produce in her coverage if she become febrile or leukocytosis started trending up. -Repeat CBC in the morning.  Hypertension.Mildly elevated blood pressure. Continue home dose of amlodipine and lisinopril. Keep holding hydrochlorothiazide.  Hypokalemia. Resolved today.  AKI. Resolved.  Iron deficiency anemia. Continue iron supplement.  Diabetes. Her CBGs remained elevated. We will continue her current dose of Levemir 6 units at bedtime with sliding scale, Levemir can be increased once started tolerating by mouth  intake.  History of atrial flutter.Currently in sinus rhythm. -Continue  Eliquis  History of recent PE. No current concerns. -Continue  Eliquis  HFrEF.No sign of volume overload. -Discontinue IV fluids as patient is able to tolerate by mouth intake.  Right renal mass/urinary retention.She is following up with  urology and have an other upcoming appointment next week. She is on Foley since 04/06/2016. -Foley was changed yesterday.   Dispo: Anticipated discharge in approximately 1 day(s).   Lorella Nimrod, MD 05/31/2016, 1:54 PM Pager: [phone]

## 2016-05-31 NOTE — Progress Notes (Signed)
Patient had several episodes of N/V on pm shift.  Small amounts of brown liquid emesis noted, abdomen soft and nontender.  Phenergan IV given and was effective for a short period of time.  Patient woke this morning with c/o N/V.  Emesis noted on patient's gown and bed.  Emesis color brown thin liquid.  Linens and patient's gown changed. Patient resting in bed when RN left room.

## 2016-05-31 NOTE — Discharge Instructions (Signed)
Information on my medicine - ELIQUIS (apixaban)  This medication education was reviewed with me or my healthcare representative as part of my discharge preparation.    Why was Eliquis prescribed for you? Eliquis was prescribed to treat blood clots that may have been found in the veins of your legs (deep vein thrombosis) or in your lungs (pulmonary embolism) and to reduce the risk of them occurring again.  THIS MEDICATION IS NOT NEW FOR YOU.  What do You need to know about Eliquis ? Continue taking ONE 5 mg tablet TWICE daily.  Eliquis may be taken with or without food.   Try to take the dose about the same time in the morning and in the evening. If you have difficulty swallowing the tablet whole please discuss with your pharmacist how to take the medication safely.  Take Eliquis exactly as prescribed and DO NOT stop taking Eliquis without talking to the doctor who prescribed the medication.  Stopping may increase your risk of developing a new blood clot.  Refill your prescription before you run out.  After discharge, you should have regular check-up appointments with your healthcare provider that is prescribing your Eliquis.    What do you do if you miss a dose? If a dose of ELIQUIS is not taken at the scheduled time, take it as soon as possible on the same day and twice-daily administration should be resumed. The dose should not be doubled to make up for a missed dose.  Important Safety Information A possible side effect of Eliquis is bleeding. You should call your healthcare provider right away if you experience any of the following: ? Bleeding from an injury or your nose that does not stop. ? Unusual colored urine (red or dark brown) or unusual colored stools (red or black). ? Unusual bruising for unknown reasons. ? A serious fall or if you hit your head (even if there is no bleeding).  Some medicines may interact with Eliquis and might increase your risk of bleeding or  clotting while on Eliquis. To help avoid this, consult your healthcare provider or pharmacist prior to using any new prescription or non-prescription medications, including herbals, vitamins, non-steroidal anti-inflammatory drugs (NSAIDs) and supplements.  This website has more information on Eliquis (apixaban): http://www.eliquis.com/eliquis/home

## 2016-05-31 NOTE — Progress Notes (Signed)
OT Cancellation Note  Patient Details Name: Sherry [name] MRN: [id number] DOB: 22-Sep-1927   Cancelled Treatment:    Reason Eval/Treat Not Completed: Fatigue/lethargy limiting ability to participate (Pt resting comfortably after medicine for n/v. Will follow.)  [name] 05/31/2016, 11:26 AM

## 2016-05-31 NOTE — Progress Notes (Signed)
Medicine attending: I examined this patient today and I concur with the evaluation and management plan as recorded by resident physician Jennefer Bravo which we discussed together. The patient is not improving.  Persistent nausea and vomiting despite maximum PPI therapy.  Large-volume dark brown liquid emesis this morning.  Uncomfortable.  Blood pressure remains stable.  No tachycardia.  Abdomen remains soft and nontender. Exaggerated rise in hemoglobin may be secondary to hemoconcentration. She is on treatment for urinary tract infection.  She may be getting some GI symptoms from the antibiotics.  Day 3 Rocephin.  Should be adequate treatment so we can stop it. We will continue to treat symptomatically.  Add low-dose Reglan.  Consider adding Carafate.  Resume IV hydration. We will ask GI consultants if they have any other recommendations.

## 2016-06-01 LAB — BASIC METABOLIC PANEL
Anion gap: 11 (ref 5–15)
BUN: 23 mg/dL — ABNORMAL HIGH (ref 6–20)
CHLORIDE: 97 mmol/L — AB (ref 101–111)
CO2: 24 mmol/L (ref 22–32)
Calcium: 8.9 mg/dL (ref 8.9–10.3)
Creatinine, Ser: 0.93 mg/dL (ref 0.44–1.00)
GFR calc Af Amer: >60 mL/min (ref >60)
GFR, EST NON AFRICAN AMERICAN: 54 mL/min — AB (ref >60)
GLUCOSE: 249 mg/dL — AB (ref 65–99)
POTASSIUM: 3.1 mmol/L — AB (ref 3.5–5.1)
Sodium: 132 mmol/L — ABNORMAL LOW (ref 135–145)

## 2016-06-01 LAB — CBC
HCT: 38.8 % (ref 36.0–46.0)
HEMOGLOBIN: 13.1 g/dL (ref 12.0–15.0)
MCH: 27.9 pg (ref 26.0–34.0)
MCHC: 33.8 g/dL (ref 30.0–36.0)
MCV: 82.7 fL (ref 78.0–100.0)
PLATELETS: 243 10*3/uL (ref 150–400)
RBC: 4.69 MIL/uL (ref 3.87–5.11)
RDW: 14.8 % (ref 11.5–15.5)
WBC: 11.9 10*3/uL — AB (ref 4.0–10.5)

## 2016-06-01 LAB — GLUCOSE, CAPILLARY
GLUCOSE-CAPILLARY: 177 mg/dL — AB (ref 65–99)
GLUCOSE-CAPILLARY: 141 mg/dL — AB (ref 65–99)
Glucose-Capillary: 208 mg/dL — ABNORMAL HIGH (ref 65–99)
Glucose-Capillary: 225 mg/dL — ABNORMAL HIGH (ref 65–99)

## 2016-06-01 MED ORDER — HYDROCHLOROTHIAZIDE 12.5 MG PO CAPS
12.5000 mg | ORAL_CAPSULE | Freq: Every day | ORAL | Status: DC
  Administered 2016-06-01 – 2016-06-02 (×2): 12.5 mg via ORAL
  Filled 2016-06-01 (×2): qty 1

## 2016-06-01 MED ORDER — METOCLOPRAMIDE HCL 10 MG PO TABS
5.0000 mg | ORAL_TABLET | Freq: Two times a day (BID) | ORAL | Status: DC
  Administered 2016-06-01 – 2016-06-02 (×3): 5 mg via ORAL
  Filled 2016-06-01 (×3): qty 1

## 2016-06-01 MED ORDER — INSULIN DETEMIR 100 UNIT/ML ~~LOC~~ SOLN
8.0000 [IU] | Freq: Every day | SUBCUTANEOUS | Status: DC
  Administered 2016-06-01: 8 [IU] via SUBCUTANEOUS
  Filled 2016-06-01 (×2): qty 0.08

## 2016-06-01 MED ORDER — SUCRALFATE 1 GM/10ML PO SUSP
1.0000 g | Freq: Three times a day (TID) | ORAL | Status: DC
  Administered 2016-06-01 – 2016-06-02 (×4): 1 g via ORAL
  Filled 2016-06-01 (×4): qty 10

## 2016-06-01 MED ORDER — POTASSIUM CHLORIDE 10 MEQ/100ML IV SOLN
10.0000 meq | INTRAVENOUS | Status: AC
  Administered 2016-06-01 (×3): 10 meq via INTRAVENOUS
  Filled 2016-06-01 (×4): qty 100

## 2016-06-01 NOTE — Progress Notes (Signed)
   Subjective:  Patient was feeling better and better this morning, denies any more emesis since this morning.  Objective:  Vital signs in last 24 hours: Vitals:   05/31/16 0618 05/31/16 1400 05/31/16 2220 06/01/16 0626  BP: (!) 142/61 (!) 169/84 (!) 151/64 (!) 153/68  Pulse: 95 92 (!) 59 99  Resp: 17  16 20   Temp: 97.7 F (36.5 C) 97.4 F (36.3 C) 98.9 F (37.2 C) 98.4 F (36.9 C)  TempSrc: Oral Oral  Oral  SpO2: 100% 100% 100% 93%   Gen. Frail, emaciated elderly lady, in no acute distress. Lungs.Clear bilaterally. CV.Regular rate and rhythm. Abdomen.Soft, non tenderness, bowel sounds positive. Extremities.No edema, no cyanosis, pulses intact and symmetrical.  Labs. CBC Latest Ref Rng & Units 06/01/2016 05/31/2016 05/30/2016  WBC 4.0 - 10.5 K/uL 11.9(H) 13.4(H) 7.5  Hemoglobin 12.0 - 15.0 g/dL 13.1 13.7 11.0(L)  Hematocrit 36.0 - 46.0 % 38.8 42.3 34.6(L)  Platelets 150 - 400 K/uL 243 291 208   BMP Latest Ref Rng & Units 06/01/2016 05/31/2016 05/30/2016  Glucose 65 - 99 mg/dL 249(H) 283(H) 213(H)  BUN 6 - 20 mg/dL 23(H) 11 12  Creatinine 0.44 - 1.00 mg/dL 0.93 1.00 0.96  Sodium 135 - 145 mmol/L 132(L) 134(L) 135  Potassium 3.5 - 5.1 mmol/L 3.1(L) 3.5 3.3(L)  Chloride 101 - 111 mmol/L 97(L) 94(L) 101  CO2 22 - 32 mmol/L 24 25 25   Calcium 8.9 - 10.3 mg/dL 8.9 9.2 8.2(L)    Assessment/Plan:  Sherry Gambone Pooleis a 83 y.o.elderly lady with past medical history significant for diabetes, hypertension, hyperlipidemia,atrial flutter on Eliquis ,recent pulmonary embolism in 02/2016 an incidental finding of right renal mass of unknown etiology came to ED with complaints of coffee-ground vomitus and melena since last night.  Emesis. She did not had any emesis since this morning. I started her on Reglan yesterday, with consideration of diabetic gastroparesis. We talked with GI, they want to Keep her nothing by mouth this morning. We appreciate their recommendations. If she does not  required any more procedure, we will start her on liquid diet and advance as tolerated. -Continue Reglan.  Hiatal hernia with reflux esophagitis. -Continue Protonix 40 mg twice daily for 4 weeks. -Follow up with GI as directed.  UTI. Clinically she seems improving, remained afebrile and no more leukocytosis, although urine culture was inconclusive due to contamination. She is high risk because of Foley for more than 6 weeks. -Continue ceftriaxone-can be changed to an oral equal and on discharge for total of 7 days.  Hypertension.Mildly elevated blood pressure. Continue home dose of amlodipine and lisinopril. Her home dose of hydrochlorothiazide can be added once she starts tolerating food.  Hypokalemia.  She was again hypokalemic this morning because of excessive emesis yesterday and overnight. -Continue normal saline with 20 mEq of KCl. -Replace KCl 40 mEq.  AKI. Resolved.  Iron deficiency anemia. Continue iron supplement.  Diabetes.Her CBGs remained elevated. -Increase Levemir to 8 units at bedtime. -Continue SSI.   Dispo: Anticipated discharge in approximately 1 day(s).  Keep her nothing by mouth Lorella Nimrod, MD 06/01/2016, 10:53 AM Pager: [phone]

## 2016-06-01 NOTE — Progress Notes (Signed)
Subjective: The patient continues to feel poorly, and was lying on her side in her bed motionless when I came into the room.  She reported that she has not felt able to eat or drink anything since yesterday.  She has vomited at least 3 times since yesterday and reported that some were bloody, although the staff has only seen spit up since yesterday.  She does not report anymore suprapubic pain.  She has not had any flatus or BM, and does not report any chest pain or dysuria.      Objective: Vital signs in last 24 hours: Vitals:   05/31/16 0618 05/31/16 1400 05/31/16 2220 06/01/16 0626  BP: (!) 142/61 (!) 169/84 (!) 151/64 (!) 153/68  Pulse: 95 92 (!) 59 99  Resp: 17  16 20   Temp: 97.7 F (36.5 C) 97.4 F (36.3 C) 98.9 F (37.2 C) 98.4 F (36.9 C)  TempSrc: Oral Oral  Oral  SpO2: 100% 100% 100% 93%    Intake/Output Summary (Last 24 hours) at 06/01/16 0739 Last data filed at 06/01/16 0600  Gross per 24 hour  Intake           281.25 ml  Output              600 ml  Net          -318.75 ml   General appearance: fatigued and moderate distress Lungs: clear to auscultation bilaterally Heart: regular rate and rhythm, S1, S2 normal, no murmur, click, rub or gallop Abdomen: soft, not tender in the suprapubic region, bowel sounds were difficult to hear, no masses, no organomegaly  Neurologic: Grossly normal Lab Results: CMP Latest Ref Rng & Units 06/01/2016 05/31/2016 05/30/2016  Glucose 65 - 99 mg/dL 249(H) 283(H) 213(H)  BUN 6 - 20 mg/dL 23(H) 11 12  Creatinine 0.44 - 1.00 mg/dL 0.93 1.00 0.96  Sodium 135 - 145 mmol/L 132(L) 134(L) 135  Potassium 3.5 - 5.1 mmol/L 3.1(L) 3.5 3.3(L)  Chloride 101 - 111 mmol/L 97(L) 94(L) 101  CO2 22 - 32 mmol/L 24 25 25   Calcium 8.9 - 10.3 mg/dL 8.9 9.2 8.2(L)  Total Protein 6.5 - 8.1 g/dL - - -  Total Bilirubin 0.3 - 1.2 mg/dL - - -  Alkaline Phos 38 - 126 U/L - - -  AST 15 - 41 U/L - - -  ALT 14 - 54 U/L - - -   CBC Latest Ref Rng & Units  06/01/2016 05/31/2016 05/30/2016  WBC 4.0 - 10.5 K/uL 11.9(H) 13.4(H) 7.5  Hemoglobin 12.0 - 15.0 g/dL 13.1 13.7 11.0(L)  Hematocrit 36.0 - 46.0 % 38.8 42.3 34.6(L)  Platelets 150 - 400 K/uL 243 291 208    Procedures: EGD (05/29/16) - Normal larynx. - Medium-sized hiatal hernia. - LA Grade D reflux esophagitis. Biopsied. - Normal stomach. - Normal duodenal bulb, first portion of the duodenum, second portion of the duodenum and third portion of the duodenum. - The examination was otherwise normal. Esophagus, biopsy  Biopsy Microscopic Comment The biopsy is of necroinflammatory debris and crushed tissue with a cellular infiltrate of undetermined significance. PAS stain is negative for fungal organisms. Recommend repeat biopsy; if clinically indicated.  Medications: I have reviewed the patient's current medications. Scheduled Meds: . amLODipine  5 mg Oral Daily  . apixaban  5 mg Oral BID  . atorvastatin  20 mg Oral QHS  . ferrous sulfate  325 mg Oral Q breakfast  . insulin aspart  0-5 Units Subcutaneous QHS  .  insulin aspart  0-9 Units Subcutaneous TID WC  . insulin detemir  6 Units Subcutaneous QHS  . lisinopril  20 mg Oral Daily  . metoCLOPramide (REGLAN) injection  5 mg Intravenous Q12H  . pantoprazole (PROTONIX) IV  40 mg Intravenous Q12H   Continuous Infusions: . 0.9 % NaCl with KCl 20 mEq / L    . cefTRIAXone (ROCEPHIN)  IV Stopped (05/31/16 1642)  . potassium chloride     PRN Meds:.acetaminophen **OR** acetaminophen, promethazine Assessment/Plan: Active Problems:   GI bleed   Upper GI bleeding   Pressure injury of skin   HH (hiatus hernia)   Hiatal hernia with gastroesophageal reflux disease without esophagitis   Hypokalemia   Cystitis   Esophagitis determined by endoscopy   Hyperglycemia  Sherry [name] is an 83 yo woman with a PMHx significant for atrial flutter, DM, HTN, hyperlipidemia, a recent PE in February, who presented to the ED with complaints of coffee  ground vomiting and melena and was found to have reflux esophagitis and hiatal hernia on EGD.   Vomiting/GI bleed:  The patient continues to have vomiting although it now is mostly sputum as she has not had anything to eat or drink for the past 24 hours or so.  She continues to report sever N/V but no abdominal pain except suprapubic tenderness.   Pathology from the last EGD showed "necroinflammtory debris of undetermined significance". GI was consulted and will come to assess her and see if another EGD is warranted.  I am concerned there is still a GI bleed or focal source of her N/V.  However her Hg has normalized over the last 2 days, making an occult bleed less likely.   Plan: -consider tagged RBC scan or CT angio if vomiting does not resolve -Follow GI consult  -advance diet as tolerated  -40 mg protonix BID -5 mg Reglan (metoclopramide) every 12 hours for N/V -Phenergan 12.5 mg every 6 hours when necessary for N/V -f/u on biopsy  AKI: The patients creatinine is now 0.93, down from 1.00 yesterday.  Her Bun is at 23 and she has not been drinking much.  Her sodium is 132  Plan: - consider IV NS bolus to help replete electrolytes    Diabetes: Her most recent A1C was 5.9 (04/19/2016) She was on Levemir 8 units daily along with sliding scale at home. Her blood sugar today is 249.   -CBC monitoring  -aspart sliding scale   Hypertension  She is on amlodipine 5 mg daily and Zestoretic 20-12 .5 daily at home. Plan: -amlodipine 5 mg daily to control HTN in hospital setting  -Hold Zestoretic  Hyperlipidemia: On atorvastatin 20mg  daily.  Last lipid panel showed in April was normal.    Hypokalemia: K+ this AM was normal at 3.1 up from 3.5.  Likely secondary to vomiting.   Plan: -monitor and replete with 40 mEq KCl IV as needed during stay  Atrial Flutter: The patient is on eliquis for atrial flutter which is not apparent on EKG, but is reported to have a long history of.     Plan: -Continue telemetry. -continue eliquis   Urinary retention She was found to have a distended bladder and urinary retention in March as well as a proreus mirabilis UTI.   She was discharged on Abx and foley but was found to have a postvoid residual volume of 102mL after foley was removed so it was replaced and was present when she presented to the ED for this admission.  UA came back positive but may be contaminated.  Urine culture had multiple species grow and we finished a course of ceftriaxone yesterday.  Plan:  -have patient follow with urology to determine future of foley/ if it needs to be replaced    CODE STATUS. Full according to patient. It was partial code prior. Need to be confirmed from her daughter. DVT prophylaxis. SCD's  Diet. advance as tolerated  Dispo: Admit patient to Observation with expected length of stay less than 2 midnights.  This is a Careers information officer Note.  The care of the patient was discussed with Dr. Reesa Chew and the assessment and plan formulated with their assistance.  Please see their attached note for official documentation of the daily encounter.   LOS: 1 day   Sherry [name], Medical Student 06/01/2016, 7:31 AM

## 2016-06-01 NOTE — Progress Notes (Signed)
Sherry [name] 12:48 PM  Subjective: We are asked to see patient by primary team and patient was found lying flat and nauseated without signs of obvious bleeding and she is currently nothing by mouth and does not want anything to eat but has no pain and has not moved her bowels but does not feel like she has to  Objective: Vital signs stable afebrile patient does not look as good as she did on Monday and not in as good as spirits but her abdomen is soft nontender labs stable fairly recent CT okay biopsies reveal significant ulceration and necrotic debris Assessment: Patient with multiple medical problems and significant reflux  Plan: She needs to have a bed elevated at all times. Allow clear liquids and hopefully can advance soon and agree with a trial of Reglan and consider adding Carafate slurry at some point and happy to see back in the office as an outpatient to determine if follow-up endoscopy in 2-3 months is warranted and wonder if depression is playing a role as well and please call us back this weekend if we can be of any further assistance [facility]  Pager [phone] After 5PM or if no answer call [phone]

## 2016-06-01 NOTE — Progress Notes (Signed)
Medicine attending: I examined this patient today and I concur with the evaluation and management plan as recorded by resident physician Dr.Sumayya Amin. Decreased vomiting since adding low-dose Reglan.  Abdomen is soft and nontender.  IV fluids had to be reinstituted due to inadequate oral intake and signs of hemoconcentration.  Potassium added to IV fluids but she is hypokalemic this morning and we will continue potassium replacement. No signs of active bleeding but she has been very slow to improve. We will continue current management plan. Impression: 1.  Hemorrhagic gastritis/esophagitis secondary to hiatus hernia with reflux versus systemic response to urinary tract infection 2.  Urinary tract infection.  Multiple species on culture.  Treated with Rocephin. 3.  Persistent nausea and vomiting 4.  Long-standing diabetes.  Suspect element of diabetic gastropathy.  Reglan just initiated.  Monitor response.

## 2016-06-01 NOTE — Evaluation (Signed)
Occupational Therapy Evaluation Patient Details Name: Sherry [name] MRN: [id number] DOB: [birth year] Today's Date: 06/01/2016    History of Present Illness Patient is an 83 yo female admitted 05/28/16  with hematemesis/coffee-ground emesis for possible GIB.     PMH:  DM, HTN, HLD, Aflutter, recent PE, Rt renal mass, CHF.   Clinical Impression   Limited evaluation due to poor tolerance of bed level mobility resulting in coughing and nausea, but did not vomit. Pt with generalized weakness, poor activity tolerance and impaired cognition. Pt will need post acute rehab in SNF. Will follow acutely.    Follow Up Recommendations  SNF;Supervision/Assistance - 24 hour    Equipment Recommendations       Recommendations for Other Services       Precautions / Restrictions Precautions Precautions: Fall Restrictions Weight Bearing Restrictions: No      Mobility Bed Mobility Overal bed mobility: Needs Assistance Bed Mobility: Rolling Rolling: Mod assist         General bed mobility comments: Pt with poor tolerance of any movement, began coughing and gagging with attempt to perform sit to supine.  Transfers                 General transfer comment: Declined    Balance                                           ADL either performed or assessed with clinical judgement   ADL                                         General ADL Comments: Pt currently requiring total assist.     Vision Patient Visual Report: No change from baseline       Perception     Praxis      Pertinent Vitals/Pain Pain Assessment: No/denies pain     Hand Dominance Right   Extremity/Trunk Assessment Upper Extremity Assessment Upper Extremity Assessment: Generalized weakness   Lower Extremity Assessment Lower Extremity Assessment: Defer to PT evaluation   Cervical / Trunk Assessment Cervical / Trunk Assessment: Kyphotic   Communication  Communication Communication: HOH   Cognition Arousal/Alertness: Awake/alert Behavior During Therapy: Flat affect Overall Cognitive Status: No family/caregiver present to determine baseline cognitive functioning                                 General Comments: Patient disoriented to place and situation.  Increased time to follow commands.  Decreased safety awareness.   General Comments       Exercises     Shoulder Instructions      Home Living Family/patient expects to be discharged to:: Skilled nursing facility Living Arrangements: [place]: [place] - 2 wheels;Hospital bed;Shower seat;Wheelchair - manual;Bedside commode          Prior Functioning/Environment Level of Independence: Needs assistance  Gait / Transfers Assistance Needed: Pt reports she ambulates limited household distances with rollater ADL's / Homemaking Assistance Needed: Patient states she does her own bath.  Unsure of prior assist needed.  OT Problem List: Decreased strength;Impaired balance (sitting and/or standing);Decreased cognition;Decreased activity tolerance;Decreased coordination;Decreased safety awareness      OT Treatment/Interventions: Self-care/ADL training;Therapeutic activities;Patient/family education;Balance training    OT Goals(Current goals can be found in the care plan section) Acute Rehab OT Goals Patient Stated Goal: None stated OT Goal Formulation: Patient unable to participate in goal setting Time For Goal Achievement: 06/15/16 Potential to Achieve Goals: Fair ADL Goals Pt Will Perform Eating: with set-up;sitting;bed level Pt Will Perform Grooming: with min assist;sitting;bed level Pt Will Transfer to Toilet: with mod assist;stand pivot transfer;bedside commode Additional ADL Goal #1: Pt will perform bed mobility with moderate assistance in preparation for ADL at EOB.  OT Frequency: Min 2X/week    Barriers to D/C:            Co-evaluation              AM-PAC PT "6 Clicks" Daily Activity     Outcome Measure Help from another person eating meals?: Total Help from another person taking care of personal grooming?: Total Help from another person toileting, which includes using toliet, bedpan, or urinal?: Total Help from another person bathing (including washing, rinsing, drying)?: Total Help from another person to put on and taking off regular upper body clothing?: Total Help from another person to put on and taking off regular lower body clothing?: Total 6 Click Score: 6   End of Session    Activity Tolerance: Treatment limited secondary to medical complications (Comment) (pt with nausea) Patient left: in bed;with call bell/phone within reach;with bed alarm set  OT Visit Diagnosis: Muscle weakness (generalized) (M62.81);Other symptoms and signs involving cognitive function                Time: 1021-1035 OT Time Calculation (min): 14 min Charges:  OT General Charges $OT Visit: 1 Procedure OT Evaluation $OT Eval Moderate Complexity: 1 Procedure G-Codes:      [name] 06/01/2016, 10:48 AM  [phone]

## 2016-06-02 DIAGNOSIS — F329 Major depressive disorder, single episode, unspecified: Secondary | ICD-10-CM

## 2016-06-02 LAB — BASIC METABOLIC PANEL
ANION GAP: 10 (ref 5–15)
BUN: 19 mg/dL (ref 6–20)
CALCIUM: 8.5 mg/dL — AB (ref 8.9–10.3)
CO2: 26 mmol/L (ref 22–32)
Chloride: 98 mmol/L — ABNORMAL LOW (ref 101–111)
Creatinine, Ser: 0.81 mg/dL (ref 0.44–1.00)
GFR calc non Af Amer: >60 mL/min (ref >60)
GLUCOSE: 183 mg/dL — AB (ref 65–99)
POTASSIUM: 3 mmol/L — AB (ref 3.5–5.1)
Sodium: 134 mmol/L — ABNORMAL LOW (ref 135–145)

## 2016-06-02 LAB — MAGNESIUM: Magnesium: 1.5 mg/dL — ABNORMAL LOW (ref 1.7–2.4)

## 2016-06-02 LAB — CBC
HEMATOCRIT: 33.4 % — AB (ref 36.0–46.0)
HEMOGLOBIN: 10.7 g/dL — AB (ref 12.0–15.0)
MCH: 26.5 pg (ref 26.0–34.0)
MCHC: 32 g/dL (ref 30.0–36.0)
MCV: 82.7 fL (ref 78.0–100.0)
Platelets: 242 10*3/uL (ref 150–400)
RBC: 4.04 MIL/uL (ref 3.87–5.11)
RDW: 14.3 % (ref 11.5–15.5)
WBC: 7.7 10*3/uL (ref 4.0–10.5)

## 2016-06-02 LAB — GLUCOSE, CAPILLARY
GLUCOSE-CAPILLARY: 157 mg/dL — AB (ref 65–99)
GLUCOSE-CAPILLARY: 192 mg/dL — AB (ref 65–99)
Glucose-Capillary: 171 mg/dL — ABNORMAL HIGH (ref 65–99)

## 2016-06-02 MED ORDER — POTASSIUM CHLORIDE 10 MEQ/100ML IV SOLN
10.0000 meq | INTRAVENOUS | Status: AC
  Administered 2016-06-02 (×6): 10 meq via INTRAVENOUS
  Filled 2016-06-02 (×3): qty 100

## 2016-06-02 MED ORDER — SERTRALINE HCL 25 MG PO TABS
25.0000 mg | ORAL_TABLET | Freq: Every day | ORAL | Status: DC
  Administered 2016-06-02: 25 mg via ORAL
  Filled 2016-06-02: qty 1

## 2016-06-02 MED ORDER — CEFDINIR 300 MG PO CAPS: 300.0000 mg | ORAL_CAPSULE | Freq: Two times a day (BID) | ORAL | 0 refills | Status: AC

## 2016-06-02 MED ORDER — MAGNESIUM SULFATE 2 GM/50ML IV SOLN
2.0000 g | Freq: Once | INTRAVENOUS | Status: AC
  Administered 2016-06-02: 2 g via INTRAVENOUS
  Filled 2016-06-02: qty 50

## 2016-06-02 MED ORDER — PRO-STAT SUGAR FREE PO LIQD: 30.0000 mL | Freq: Three times a day (TID) | ORAL | Status: DC

## 2016-06-02 MED ORDER — SERTRALINE HCL 25 MG PO TABS: 25.0000 mg | ORAL_TABLET | Freq: Every day | ORAL | 2 refills | Status: DC

## 2016-06-02 MED ORDER — ADULT MULTIVITAMIN W/MINERALS CH: 1.0000 | ORAL_TABLET | Freq: Every day | ORAL | 2 refills | Status: DC

## 2016-06-02 MED ORDER — SUCRALFATE 1 GM/10ML PO SUSP: 1.0000 g | Freq: Three times a day (TID) | ORAL | 0 refills | Status: DC

## 2016-06-02 MED ORDER — ADULT MULTIVITAMIN W/MINERALS CH: 1.0000 | ORAL_TABLET | Freq: Every day | ORAL | Status: DC

## 2016-06-02 MED ORDER — INSULIN DETEMIR 100 UNIT/ML FLEXPEN: 8.0000 [IU] | PEN_INJECTOR | Freq: Every morning | SUBCUTANEOUS | 0 refills | Status: DC

## 2016-06-02 MED ORDER — FERROUS SULFATE 325 (65 FE) MG PO TABS: 325.0000 mg | ORAL_TABLET | Freq: Every day | ORAL | 3 refills | Status: DC

## 2016-06-02 MED ORDER — METOCLOPRAMIDE HCL 5 MG PO TABS: 5.0000 mg | ORAL_TABLET | Freq: Two times a day (BID) | ORAL | 2 refills | Status: DC

## 2016-06-02 MED ORDER — PRO-STAT SUGAR FREE PO LIQD: 30.0000 mL | Freq: Three times a day (TID) | ORAL | 2 refills | Status: DC

## 2016-06-02 MED ORDER — POTASSIUM CHLORIDE CRYS ER 20 MEQ PO TBCR: 20.0000 meq | EXTENDED_RELEASE_TABLET | Freq: Every day | ORAL | 2 refills | Status: DC

## 2016-06-02 NOTE — Progress Notes (Signed)
PT Cancellation Note  Patient Details Name: ZYAIRA VEJAR MRN: [id number] DOB: 12-09-1927   Cancelled Treatment:    Reason Eval/Treat Not Completed: Patient declined, no reason specified;Medical issues which prohibited therapy.  Pt unable to participate due to nausea.  Will try back later as able. 06/02/2016  Donnella Sham, [place] [phone]  (pager)   Tessie Fass Rin Gorton 06/02/2016, 11:08 AM

## 2016-06-02 NOTE — Progress Notes (Signed)
[facility] to be D/C'd Skilled nursing facility per MD order.  Discussed with the PITAR and all questions fully answered. Report was given to the nurse at the SNF pt was transferred to.  VSS, Skin clean, dry and intact without evidence of skin break down, no evidence of skin tears noted. IV catheter discontinued intact. Site without signs and symptoms of complications. Dressing and pressure applied.  An After Visit Summary was printed and given to PTAR, and prescription was also given to them.  D/c education completed with PITAR including follow up instructions, medication list, d/c activities limitations if indicated, with other d/c instructions as indicated by MD   lPatient escorted via strecher, and D/C to SNF via ambulance.  Dorris Carnes 06/02/2016 4:41 PM

## 2016-06-02 NOTE — Progress Notes (Addendum)
Patient will DC to: [facility] Anticipated DC date: 06/02/16 Family notified: Daughter, Lexicographer by: Corey Harold   Per MD patient ready for DC to [facility]. RN, patient, patient's family, and facility notified of DC. Discharge Summary sent to facility. RN given number for report [phone] Room 2B). DC packet on chart. Ambulance transport requested for patient. CSW confirmed that MD is not prescribing Oxycodone and patient does not require a script.  CSW signing off.  Cedric Fishman, [place] Social Worker [phone]

## 2016-06-02 NOTE — Clinical Social Work Note (Signed)
Clinical Social Work Assessment  Patient Details  Name: Sherry [name] MRN: [id number] Date of Birth: 04/19/1927  Date of referral:  06/02/16               Reason for consult:  Facility Placement                Permission sought to share information with:  Facility Sport and exercise psychologist, Family Supports Permission granted to share information::  No (Patient nonverbal on day of assessment)  Name::     Lisa/Chris  Agency::  SNFs  Relationship::  Daughter/son-in-law  Contact Information:  [phone]  Housing/Transportation Living arrangements for the past 2 months:  [place], [place] of Information:  Adult Children Patient Interpreter Needed:  None Criminal Activity/Legal Involvement Pertinent to Current Situation/Hospitalization:  No - Comment as needed Significant Relationships:  Adult Children Lives with:  Adult Children Do you feel safe going back to the place where you live?  No Need for family participation in patient care:  Yes (Comment)  Care giving concerns:  CSW received consult for possible SNF placement at time of discharge. CSW spoke with patient's daughter and son-in-law regarding PT recommendation of SNF placement at time of discharge. Patient's son-in-law reported that patient's daughter is currently unable to care for patient at their home given patient's current physical needs and fall risk. Patient's daughter expressed understanding of PT recommendation and is agreeable to SNF placement at time of discharge. CSW to continue to follow and assist with discharge planning needs.   Social Worker assessment / plan:  CSW spoke with patient and daughter concerning possibility of rehab at [facility] before returning home.  Employment status:  Retired Nurse, adult, Medicaid In [place] PT Recommendations:  [place] / Referral to community resources:  [place]  Patient/Family's  Response to care:  Patient recognizes need for rehab before returning home and is agreeable to a SNF in [place]. Patient reported preference for [facility], however, CSW was told by facility that the patient owes money to that facility. CSW provided other bed offers.   Patient/Family's Understanding of and Emotional Response to Diagnosis, Current Treatment, and Prognosis:  Patient/family is realistic regarding therapy needs and expressed being hopeful for SNF placement. Patient would not engage with CSW. Patient's daughter and son-in-law expressed understanding of CSW role and discharge process and had questions about patient's condition. CSW directed them to RN. No questions/concerns about plan or treatment.    Emotional Assessment Appearance:  Appears stated age Attitude/Demeanor/Rapport:  Unable to Assess Affect (typically observed):  Unable to Assess Orientation:  Oriented to Self, Oriented to Situation, Oriented to Place, Oriented to  Time Alcohol / Substance use:  Not Applicable Psych involvement (Current and /or in the community):  No (Comment)  Discharge Needs  Concerns to be addressed:  Care Coordination Readmission within the last 30 days:  Yes Current discharge risk:  None Barriers to Discharge:  Continued Medical Work up   [employer], [place] 06/02/2016, 8:20 AM

## 2016-06-02 NOTE — Progress Notes (Signed)
   Subjective: Patient was feeling better better today, still lying curled up in her bed, stating that it appears that her stomach is settling down. She was able to tolerate her meals, denies any nausea or vomiting.  Objective:  Vital signs in last 24 hours: Vitals:   06/01/16 1446 06/01/16 2308 06/02/16 0532 06/02/16 0942  BP: (!) 178/67 (!) 164/75 (!) 163/78 (!) 159/56  Pulse: 89 (!) 102 86   Resp: 20 (!) 24 20   Temp: 98.9 F (37.2 C) 97.7 F (36.5 C) 97.4 F (36.3 C)   TempSrc: Oral  Oral   SpO2: 100% 100% 98%    Gen. Frail, emaciated elderly lady, in no acute distress, appears depressed. Lungs.Clear bilaterally. CV.Regular rate and rhythm. Abdomen.Soft, non tenderness, bowel sounds positive. Extremities.No edema, no cyanosis, pulses intact and symmetrical.  Labs. CBC Latest Ref Rng & Units 06/02/2016 06/01/2016 05/31/2016  WBC 4.0 - 10.5 K/uL 7.7 11.9(H) 13.4(H)  Hemoglobin 12.0 - 15.0 g/dL 10.7(L) 13.1 13.7  Hematocrit 36.0 - 46.0 % 33.4(L) 38.8 42.3  Platelets 150 - 400 K/uL 242 243 291   BMP Latest Ref Rng & Units 06/02/2016 06/01/2016 05/31/2016  Glucose 65 - 99 mg/dL 183(H) 249(H) 283(H)  BUN 6 - 20 mg/dL 19 23(H) 11  Creatinine 0.44 - 1.00 mg/dL 0.81 0.93 1.00  Sodium 135 - 145 mmol/L 134(L) 132(L) 134(L)  Potassium 3.5 - 5.1 mmol/L 3.0(L) 3.1(L) 3.5  Chloride 101 - 111 mmol/L 98(L) 97(L) 94(L)  CO2 22 - 32 mmol/L 26 24 25   Calcium 8.9 - 10.3 mg/dL 8.5(L) 8.9 9.2   Magnesium. 1.5  Assessment/Plan:  Sherry [name] a 83 y.o.elderly lady with past medical history significant for diabetes, hypertension, hyperlipidemia,atrial flutter on Eliquis ,recent pulmonary embolism in 02/2016 an incidental finding of right renal mass of unknown etiology came to ED with complaints of coffee-ground vomitus and melena since last night.  Emesis. She did not had any emesis since yesterday. Continue Reglan and Carafate.  Hiatal hernia with reflux esophagitis. -Continue  Protonix 40 mg twice daily for 4 weeks. -Follow up with GI as directed.  Drop in hemoglobin. Most likely dilutional because of continuous IV fluid, she has quite variability in the past. No obvious sign of bleeding. -Continue home dose of Eliquis.  UTI. Continue ceftriaxone during hospitalization need to complete total 7 days of antibiotic. Today is day 5.  Hypertension.Mildly elevated blood pressure. Restart her home dose of hydrochlorothiazide. Continue amlodipine and lisinopril.  Hypokalemia and hypomagnesemia. She is having persistent hypokalemia despite of replacement. She was having hypomagnesemia on presentation which were replaced and subsequent check was normal, on recheck again found to have magnesium of 1.5. -Replaced magnesium and potassium. -Repeat BMP in the morning.  Depression. Patient appears very depressed. Never seen a family member at bedside. Daughter is unable to take care of her at home and wants her to go to a skilled nursing facility. -Start her on Zoloft 25 mg daily.  AKI. Resolved.  Iron deficiency anemia.Continue iron supplement.  Diabetes. Her CBG improved with Increase of Levemir. Continue Levemir 8 units at bedtime. Continue SSI.   Dispo: Anticipated discharge in approximately 0-1 day(s). Depending on bed availability at SNF.  Lorella Nimrod, MD 06/02/2016, 12:32 PM Pager: [phone]

## 2016-06-02 NOTE — Clinical Social Work Placement (Signed)
   CLINICAL SOCIAL WORK PLACEMENT  NOTE  Date:  06/02/2016  Patient Details  Name: Sherry [name] MRN: [id number] Date of Birth: 28-Apr-1927  Clinical Social Work is seeking post-discharge placement for this patient at the [place] level of care (*CSW will initial, date and re-position this form in  chart as items are completed):  Yes   Patient/family provided with [facility] Work Department's list of facilities offering this level of care within the geographic area requested by the patient (or if unable, by the patient's family).  Yes   Patient/family informed of their freedom to choose among providers that offer the needed level of care, that participate in Medicare, Medicaid or managed care program needed by the patient, have an available bed and are willing to accept the patient.  Yes   Patient/family informed of [facility]'s ownership interest in [facility] and [facility], as well as of the fact that they are under no obligation to receive care at these facilities.  PASRR submitted to EDS on       PASRR number received on       Existing PASRR number confirmed on 05/31/16     FL2 transmitted to all facilities in geographic area requested by pt/family on 05/31/16     FL2 transmitted to all facilities within larger geographic area on       Patient informed that his/her managed care company has contracts with or will negotiate with certain facilities, including the following:        Yes   Patient/family informed of bed offers received.  Patient chooses bed at [facility]     Physician recommends and patient chooses bed at      Patient to be transferred to [facility] on 06/02/16.  Patient to be transferred to facility by [facility]     Patient family notified on 06/02/16 of transfer.  Name of family member notified:  [name], daughter     PHYSICIAN       Additional Comment:     _______________________________________________ Benard Halsted, [place] 06/02/2016, 12:47 PM

## 2016-06-02 NOTE — Progress Notes (Signed)
Initial Nutrition Assessment  DOCUMENTATION CODES:   Not applicable  INTERVENTION:   -30 ml Prostat TID, each supplement provides 100 kcals and 15 grams protein -MVI daily  NUTRITION DIAGNOSIS:   Increased nutrient needs related to wound healing as evidenced by estimated needs.  GOAL:   Patient will meet greater than or equal to 90% of their needs  MONITOR:   PO intake, Supplement acceptance, Labs, Weight trends, Skin, I & O's  REASON FOR ASSESSMENT:   Consult Assessment of nutrition requirement/status  ASSESSMENT:   Sherry [name] is a 83 y.o.  elderly lady with past medical history significant for diabetes, hypertension, hyperlipidemia, atrial flutter on Eliquis , recent pulmonary embolism in 02/2016 an incidental finding of right renal mass of unknown etiology came to ED with complaints of coffee-ground vomitus and melena since last night.  Pt admitted with nausea and vomiting.   Pt very lethargic and did not respond to RD when greeted or during exam. Noted tray on bedside has been unattempted.   Case discussed with RN, who reports pt has had minimal intake all day, but will take medications. She reports concern over pt's very poor oral intake, but does reports plan for pt to d/c to [facility] today.   Nutrition-Focused physical exam completed. Findings are no fat depletion, no muscle depletion, and no edema. Pt had severe depletion in calf area only, however, that may be related to limited mobility and advanced age.  Noted pt has experienced 18% wt loss over the past 3 months, however, using weight from PTA. Unable to obtain current wt. Suspect some wt loss may be related to dehydration.   Labs reviewed: Na: 134, K: 3.0, Mg: 1.5, CBGS: 157-192.   Diet Order:  Diet heart healthy/carb modified Room service appropriate? Yes; Fluid consistency: Thin Diet - low sodium heart healthy  Skin:  Wound (see comment) (stage II sacrum)  Last BM:  05/28/16  Height:    Ht Readings from Last 1 Encounters:  06/02/16 5\' 4"  (1.626 m)    Weight:   Wt Readings from Last 1 Encounters:  06/02/16 143 lb (64.9 kg)    Ideal Body Weight:  54.5 kg  BMI:  Body mass index is 24.55 kg/m.  Estimated Nutritional Needs:   Kcal:  1600-1800  Protein:  75-90 grams  Fluid:  >1.6 L  EDUCATION NEEDS:   Education needs addressed  Antha Niday A. Jimmye Norman, RD, LDN, CDE Pager: [phone] After hours Pager: [phone]

## 2016-06-07 ENCOUNTER — Telehealth: Payer: Self-pay

## 2016-06-07 NOTE — Telephone Encounter (Signed)
[facility] called requesting to remove pt foley cath. Per Dr. Pilar Jarvis pt should not have foley removed at this time. Pt should have CT hematuria workup and RTC for voiding trial in the morning. Made lady at [facility] aware. Follow up appt made for pt.

## 2016-06-22 ENCOUNTER — Ambulatory Visit
Admission: RE | Admit: 2016-06-22 | Discharge: 2016-06-22 | Disposition: A | Discharge status: Home / Self Care | Payer: Medicare Other | Source: Ambulatory Visit | Attending: Urology | Admitting: Urology

## 2016-06-22 DIAGNOSIS — R93421 Abnormal radiologic findings on diagnostic imaging of right kidney: Secondary | ICD-10-CM | POA: Diagnosis not present

## 2016-06-22 DIAGNOSIS — K229 Disease of esophagus, unspecified: Secondary | ICD-10-CM | POA: Insufficient documentation

## 2016-06-22 DIAGNOSIS — D49519 Neoplasm of unspecified behavior of unspecified kidney: Secondary | ICD-10-CM | POA: Insufficient documentation

## 2016-06-22 DIAGNOSIS — J90 Pleural effusion, not elsewhere classified: Secondary | ICD-10-CM | POA: Insufficient documentation

## 2016-06-22 DIAGNOSIS — M4856XA Collapsed vertebra, not elsewhere classified, lumbar region, initial encounter for fracture: Secondary | ICD-10-CM | POA: Insufficient documentation

## 2016-06-22 DIAGNOSIS — I2510 Atherosclerotic heart disease of native coronary artery without angina pectoris: Secondary | ICD-10-CM | POA: Insufficient documentation

## 2016-06-22 DIAGNOSIS — K5900 Constipation, unspecified: Secondary | ICD-10-CM | POA: Insufficient documentation

## 2016-06-22 DIAGNOSIS — K529 Noninfective gastroenteritis and colitis, unspecified: Secondary | ICD-10-CM | POA: Diagnosis not present

## 2016-06-22 DIAGNOSIS — I700 Atherosclerosis of aorta: Secondary | ICD-10-CM | POA: Diagnosis not present

## 2016-06-22 DIAGNOSIS — K449 Diaphragmatic hernia without obstruction or gangrene: Secondary | ICD-10-CM | POA: Diagnosis not present

## 2016-06-22 MED ORDER — IOPAMIDOL (ISOVUE-300) INJECTION 61%
100.0000 mL | Freq: Once | INTRAVENOUS | Status: AC | PRN
  Administered 2016-06-22: 100 mL via INTRAVENOUS

## 2016-06-30 ENCOUNTER — Emergency Department
Admission: EM | Admit: 2016-06-30 | Discharge: 2016-07-01 | Disposition: A | Discharge status: Skilled Nursing Facility | Payer: Medicare Other | Attending: Emergency Medicine | Admitting: Emergency Medicine

## 2016-06-30 ENCOUNTER — Emergency Department: Payer: Medicare Other

## 2016-06-30 ENCOUNTER — Encounter: Payer: Self-pay | Admitting: Emergency Medicine

## 2016-06-30 DIAGNOSIS — I129 Hypertensive chronic kidney disease with stage 1 through stage 4 chronic kidney disease, or unspecified chronic kidney disease: Secondary | ICD-10-CM | POA: Insufficient documentation

## 2016-06-30 DIAGNOSIS — E119 Type 2 diabetes mellitus without complications: Secondary | ICD-10-CM | POA: Insufficient documentation

## 2016-06-30 DIAGNOSIS — N183 Chronic kidney disease, stage 3 (moderate): Secondary | ICD-10-CM | POA: Diagnosis not present

## 2016-06-30 DIAGNOSIS — Z7901 Long term (current) use of anticoagulants: Secondary | ICD-10-CM | POA: Insufficient documentation

## 2016-06-30 DIAGNOSIS — Z794 Long term (current) use of insulin: Secondary | ICD-10-CM | POA: Diagnosis not present

## 2016-06-30 DIAGNOSIS — Z79899 Other long term (current) drug therapy: Secondary | ICD-10-CM | POA: Diagnosis not present

## 2016-06-30 DIAGNOSIS — R1312 Dysphagia, oropharyngeal phase: Secondary | ICD-10-CM | POA: Insufficient documentation

## 2016-06-30 DIAGNOSIS — R4702 Dysphasia: Secondary | ICD-10-CM

## 2016-06-30 LAB — CBC WITH DIFFERENTIAL/PLATELET
BASOS PCT: 1 %
Basophils Absolute: 0.1 10*3/uL (ref 0–0.1)
EOS ABS: 0.1 10*3/uL (ref 0–0.7)
EOS PCT: 1 %
HCT: 36.5 % (ref 35.0–47.0)
Hemoglobin: 12.1 g/dL (ref 12.0–16.0)
Lymphocytes Relative: 48 %
Lymphs Abs: 2.8 10*3/uL (ref 1.0–3.6)
MCH: 27.7 pg (ref 26.0–34.0)
MCHC: 33.2 g/dL (ref 32.0–36.0)
MCV: 83.5 fL (ref 80.0–100.0)
MONO ABS: 0.5 10*3/uL (ref 0.2–0.9)
Monocytes Relative: 8 %
Neutro Abs: 2.5 10*3/uL (ref 1.4–6.5)
Neutrophils Relative %: 42 %
PLATELETS: 304 10*3/uL (ref 150–440)
RBC: 4.37 MIL/uL (ref 3.80–5.20)
RDW: 14.5 % (ref 11.5–14.5)
WBC: 5.9 10*3/uL (ref 3.6–11.0)

## 2016-06-30 LAB — BASIC METABOLIC PANEL
ANION GAP: 7 (ref 5–15)
BUN: 12 mg/dL (ref 6–20)
CALCIUM: 8.7 mg/dL — AB (ref 8.9–10.3)
CO2: 30 mmol/L (ref 22–32)
CREATININE: 0.86 mg/dL (ref 0.44–1.00)
Chloride: 105 mmol/L (ref 101–111)
GFR calc Af Amer: >60 mL/min (ref >60)
GFR, EST NON AFRICAN AMERICAN: 59 mL/min — AB (ref >60)
GLUCOSE: 100 mg/dL — AB (ref 65–99)
Potassium: 3.6 mmol/L (ref 3.5–5.1)
Sodium: 142 mmol/L (ref 135–145)

## 2016-06-30 MED ORDER — PANTOPRAZOLE SODIUM 40 MG IV SOLR
40.0000 mg | Freq: Once | INTRAVENOUS | Status: AC
  Administered 2016-06-30: 40 mg via INTRAVENOUS
  Filled 2016-06-30: qty 40

## 2016-06-30 MED ORDER — SODIUM CHLORIDE 0.9 % IV BOLUS (SEPSIS)
1000.0000 mL | Freq: Once | INTRAVENOUS | Status: AC
  Administered 2016-06-30: 1000 mL via INTRAVENOUS

## 2016-06-30 NOTE — Discharge Instructions (Signed)
Results for orders placed or performed during the hospital encounter of 48/18/56  Basic metabolic panel  Result Value Ref Range   Sodium 142 135 - 145 mmol/L   Potassium 3.6 3.5 - 5.1 mmol/L   Chloride 105 101 - 111 mmol/L   CO2 30 22 - 32 mmol/L   Glucose, Bld 100 (H) 65 - 99 mg/dL   BUN 12 6 - 20 mg/dL   Creatinine, Ser 0.86 0.44 - 1.00 mg/dL   Calcium 8.7 (L) 8.9 - 10.3 mg/dL   GFR calc non Af Amer 59 (L) >60 mL/min   GFR calc Af Amer >60 >60 mL/min   Anion gap 7 5 - 15  CBC with Differential  Result Value Ref Range   WBC 5.9 3.6 - 11.0 K/uL   RBC 4.37 3.80 - 5.20 MIL/uL   Hemoglobin 12.1 12.0 - 16.0 g/dL   HCT 36.5 35.0 - 47.0 %   MCV 83.5 80.0 - 100.0 fL   MCH 27.7 26.0 - 34.0 pg   MCHC 33.2 32.0 - 36.0 g/dL   RDW 14.5 11.5 - 14.5 %   Platelets 304 150 - 440 K/uL   Neutrophils Relative % 42 %   Neutro Abs 2.5 1.4 - 6.5 K/uL   Lymphocytes Relative 48 %   Lymphs Abs 2.8 1.0 - 3.6 K/uL   Monocytes Relative 8 %   Monocytes Absolute 0.5 0.2 - 0.9 K/uL   Eosinophils Relative 1 %   Eosinophils Absolute 0.1 0 - 0.7 K/uL   Basophils Relative 1 %   Basophils Absolute 0.1 0 - 0.1 K/uL   Dg Neck Soft Tissue  Result Date: 06/30/2016 CLINICAL DATA:  Acute onset of throat pain and dysphagia. Initial encounter. EXAM: NECK SOFT TISSUES - 1+ VIEW COMPARISON:  CT myelogram of the cervical spine performed 09/28/2005 FINDINGS: Prevertebral soft tissues are within normal limits. The nasopharynx, oropharynx and hypopharynx are grossly unremarkable. The epiglottis is normal in thickness. The proximal trachea is unremarkable. The patient is status post anterior cervical spinal fusion at C4-C7. Anterior osteophyte formation is noted at C3-C4. The visualized lung apices are grossly clear. IMPRESSION: Unremarkable radiographs of the soft tissues of the neck. Electronically Signed   By: Garald Balding M.D.   On: 06/30/2016 22:28   Dg Chest 2 View  Result Date: 06/30/2016 CLINICAL DATA:  Dysphagia  and throat pain for 4 days. EXAM: CHEST  2 VIEW COMPARISON:  Chest radiographs 05/28/2016. CT 4 days prior 06/22/2016 FINDINGS: The cardiomediastinal contours are normal. Atherosclerosis of the thoracic aorta. Moderate hiatal hernia. Small right pleural effusion on recent abdominal CT not well seen radiographically. Pulmonary vasculature is normal. No consolidation or pneumothorax. The bones are under mineralized. Chronic change about the right shoulder. No acute osseous abnormalities are seen. IMPRESSION: 1. No acute abnormality.  Thoracic aortic atherosclerosis. 2. Hiatal hernia. Recent abdominal CT demonstrated esophageal thickening of the distal esophagus, possibly cause of patient's dysphasia. Electronically Signed   By: Jeb Levering M.D.   On: 06/30/2016 22:28   Ct Hematuria Workup  Result Date: 06/22/2016 CLINICAL DATA:  Renal mass follow up. EXAM: CT ABDOMEN AND PELVIS WITHOUT AND WITH CONTRAST TECHNIQUE: Multidetector CT imaging of the abdomen and pelvis was performed following the standard protocol before and following the bolus administration of intravenous contrast. CONTRAST:  152mL ISOVUE-300 IOPAMIDOL (ISOVUE-300) INJECTION 61% COMPARISON:  04/19/2016 FINDINGS: Lower chest: Moderate to large hiatal hernia. Distal esophageal wall thickening approaching the hiatal hernia as on image 1/5. Trace bilateral pleural  effusions. Coronary artery atherosclerosis. Borderline cardiomegaly. Hepatobiliary: Subtle subcapsular hypodensity node along the right hepatic lobe on image 14/11, similar to prior. Slight nodularity of the liver margin. Pancreas:  Circumportal pancreas.  Atrophy in the pancreatic tail. Spleen: Unremarkable Adrenals/Urinary Tract: Adrenal glands normal. As on the 03/09/2016 exam, low-density filling defect fills upper pole calices on the right and there is likewise abnormal filling defect in the right renal pelvis centrally. There is some faintly accentuated marginal or mucosal  enhancement along the right renal collecting system on portal venous phase images as on image 34/5. Borderline wall thickening in the right proximal ureter on images 39-40 series 15. Subtle mucosal filling defect along the right anterolateral urinary bladder on images 25-22 series 15, although there is no significant abnormal accentuated enhancement in this vicinity on the portal venous phase images. A Foley catheter is present in the urinary bladder. Stomach/Bowel: Periampullary duodenal diverticulum. Prominence of stool in the rectal vault with several hyperdensities possibly from suppositories or pill fragments. Mild presacral edema although less than on the prior exam. Generalized prominence of stool in the colon suggesting constipation. Vascular/Lymphatic: Aortoiliac atherosclerotic vascular disease. No pathologic adenopathy identified. Reproductive: Unremarkable Other: No supplemental non-categorized findings. Musculoskeletal: Stable approximately 50% L3 compression fracture with minimal posterior bony retropulsion. Lumbar spondylosis and degenerative disc disease causing right foraminal impingement at L5-S1. IMPRESSION: 1. Prominent filling defects in the right kidney upper pole calices and collecting system very similar in appearance to 03/09/2016. There is some faint mucosal enhancement in the collecting system but these filling defects themselves do not appreciably enhance. Possibilities include transitional cell carcinoma, fungus balls, or less likely chronic thrombus. 2. Distal esophageal wall thickening, probably from esophagitis. Moderate to large hiatal hernia. 3. The rectum is distended with stool and there some presacral stranding. Low-grade stercoral colitis is not totally excluded. However, there is generalized constipation and the degree of perirectal stranding is actually reduced compared to 04/19/2016. 4. Trace bilateral pleural effusions. 5. Coronary atherosclerosis.  Aortic Atherosclerosis  (ICD10-I70.0). 6. Chronic L3 compression fracture. 7. Circumportal pancreas. Electronically Signed   By: Van Clines M.D.   On: 06/22/2016 16:31

## 2016-06-30 NOTE — ED Triage Notes (Signed)
EMS reports they were unable to talk to nurse at [facility].  Pt. States difficulty swallowing for the past 4 days.  Pt. Able to hold down secretions.  Pt. Was evaluated by physician at facility and recommended appt. With ENT.

## 2016-06-30 NOTE — ED Provider Notes (Signed)
[facility] Emergency Department Provider Note  ____________________________________________  Time seen: Approximately 11:25 PM  I have reviewed the triage vital signs and the nursing notes.   HISTORY  Chief Complaint Dysphagia (Pt. here via EMS from [facility].)    HPI Sherry [name] is a 83 y.o. female sent to the ED for evaluation of dysphagia. Patient denies any complaints other than having difficulty swallowing. Denies any acute pain. Patient does have a history of hiatal hernia and GERD and esophagitis diagnosed on CT and endoscopy in the past. No fevers or chills. No vomiting. Pain did not start after eating solids.  Patient's nursing home inserted subcutaneous abdominal wall catheters for IV fluid hydration.     Past Medical History:  Diagnosis Date  . Diabetes mellitus without complication ([place])   . Hyperlipidemia   . Hypertension   . Pulmonary embolism [facility])      Patient Active Problem List   Diagnosis Date Noted  . Reactive depression   . Esophagitis determined by endoscopy 05/31/2016  . Hyperglycemia   . Hypokalemia   . Cystitis   . HH (hiatus hernia)   . Hiatal hernia with gastroesophageal reflux disease without esophagitis   . GI bleed 05/28/2016  . Upper GI bleeding 05/28/2016  . Pressure injury of skin 05/28/2016  . Atrial flutter ([place])   . Gastroenteritis 04/24/2016  . Aphasia 04/18/2016  . Nausea & vomiting 03/27/2016  . Advance care planning   . UTI (urinary tract infection) 03/12/2016  . Chest pain 03/09/2016  . PE (pulmonary thromboembolism) ([place]) 02/26/2016  . Diabetes mellitus type 2, insulin dependent ([place]) 02/26/2016  . Anemia 02/26/2016  . Chronic kidney disease (CKD), stage III (moderate) 02/26/2016  . Essential hypertension 02/26/2016  . Goals of care, counseling/discussion   . Adult failure to thrive syndrome   . Palliative care by specialist   . Sepsis ([place]) 11/26/2014     Past Surgical  History:  Procedure Laterality Date  . APPENDECTOMY    . BACK SURGERY    . ESOPHAGOGASTRODUODENOSCOPY N/A 05/29/2016   Procedure: ESOPHAGOGASTRODUODENOSCOPY (EGD);  Surgeon: Clarene [facility], MD;  Location: [facility];  Service: Gastroenterology;  Laterality: N/A;  . SHOULDER SURGERY       Prior to Admission medications   Medication Sig Start Date End Date Taking? Authorizing Provider  Amino Acids-Protein Hydrolys (FEEDING SUPPLEMENT, PRO-STAT SUGAR FREE 64,) LIQD Take 30 mLs by mouth 3 (three) times daily. 06/02/16   Shela Leff, MD  amLODipine (NORVASC) 5 MG tablet Take 5 mg by mouth daily.    [provider]  apixaban (ELIQUIS) 5 MG TABS tablet Take 1 tablet (5 mg total) by mouth 2 (two) times daily. Patient taking differently: Take 10 mg by mouth 2 (two) times daily.  03/08/16   Isaac Bliss, Rayford Halsted, MD  atorvastatin (LIPITOR) 20 MG tablet Take 20 mg by mouth at bedtime.     [provider]  diphenhydrAMINE (BENADRYL) 25 MG tablet Take 25 mg by mouth daily as needed for itching or allergies.    [provider]  ferrous sulfate 325 (65 FE) MG tablet Take 1 tablet (325 mg total) by mouth daily with breakfast. 06/03/16   Lorella Nimrod, MD  gabapentin (NEURONTIN) 300 MG capsule Take 300 mg by mouth at bedtime.     [provider]  Insulin Detemir (LEVEMIR) 100 UNIT/ML Pen Inject 8 Units into the skin every morning. 06/02/16   Lorella Nimrod, MD  lisinopril-hydrochlorothiazide (PRINZIDE,ZESTORETIC) 20-12.5 MG tablet  Take 2 tablets by mouth daily.     [provider]  magnesium oxide (MAG-OX) 400 (241.3 Mg) MG tablet Take 1 tablet (400 mg total) by mouth daily. Patient not taking: Reported on 05/28/2016 04/27/16   Loletha Grayer, MD  meloxicam (MOBIC) 7.5 MG tablet Take 7.5 mg by mouth daily.    [provider]  metoCLOPramide (REGLAN) 5 MG tablet Take 1 tablet (5 mg total) by mouth 2 (two) times daily. 06/02/16   Lorella Nimrod, MD   Multiple Vitamin (MULTIVITAMIN WITH MINERALS) TABS tablet Take 1 tablet by mouth daily. 06/02/16   Shela Leff, MD  oxyCODONE-acetaminophen (PERCOCET/ROXICET) 5-325 MG tablet Take 2 tablets by mouth 2 (two) times daily.    [provider]  OXYGEN Inhale 2 L into the lungs continuous.    [provider]  pantoprazole (PROTONIX) 40 MG tablet Take 1 tablet (40 mg total) by mouth 2 (two) times daily. Patient taking differently: Take 40 mg by mouth daily.  04/27/16   Loletha Grayer, MD  polyethylene glycol [facility]) packet Take 17 g by mouth daily as needed. Patient not taking: Reported on 05/28/2016 04/27/16   Loletha Grayer, MD  potassium chloride (K-DUR,KLOR-CON) 20 MEQ tablet Take 1 tablet (20 mEq total) by mouth daily. 06/02/16   Lorella Nimrod, MD  sertraline (ZOLOFT) 25 MG tablet Take 1 tablet (25 mg total) by mouth daily. 06/02/16   Lorella Nimrod, MD  sucralfate (CARAFATE) 1 GM/10ML suspension Take 10 mLs (1 g total) by mouth 4 (four) times daily -  with meals and at bedtime. 06/02/16   Lorella Nimrod, MD  tamsulosin (FLOMAX) 0.4 MG CAPS capsule Take 1 capsule (0.4 mg total) by mouth daily. Patient taking differently: Take 0.4 mg by mouth daily after lunch.  03/14/16   Fritzi Mandes, MD     Allergies Patient has no known allergies.   Family History  Problem Relation Age of Onset  . Diabetes Mother   . Cancer Mother   . Cancer Father   . Bladder Cancer Neg Hx   . Kidney cancer Neg Hx     Social History Social History  Substance Use Topics  . Smoking status: Never Smoker  . Smokeless tobacco: Never Used  . Alcohol use No    Review of Systems  Constitutional:   No fever or chills.  ENT:   No sore throat. No rhinorrhea.Positive dysphasia Cardiovascular:   No chest pain or syncope. Respiratory:   No dyspnea or cough. Gastrointestinal:   Negative for abdominal pain, vomiting and diarrhea.  Musculoskeletal:   Negative for focal pain or swelling All other  systems reviewed and are negative except as documented above in ROS and HPI.  ____________________________________________   PHYSICAL EXAM:  VITAL SIGNS: ED Triage Vitals  Enc Vitals Group     BP 06/30/16 2047 119/89     Pulse Rate 06/30/16 2047 81     Resp 06/30/16 2047 18     Temp 06/30/16 2047 98.4 F (36.9 C)     Temp Source 06/30/16 2047 Oral     SpO2 06/30/16 2047 97 %     Weight 06/30/16 2059 133 lb (60.3 kg)     Height 06/30/16 2059 5\' 4"  (1.626 m)     Head Circumference --      Peak Flow --      Pain Score 06/30/16 2121 9     Pain Loc --      Pain Edu? --      Excl. in  GC? --     Vital signs reviewed, nursing assessments reviewed.   Constitutional:   Alert and oriented. Well appearing and in no distress. Eyes:   No scleral icterus.  EOMI. No nystagmus. No conjunctival pallor. PERRL. ENT   Head:   Normocephalic and atraumatic.   Nose:   No congestion/rhinnorhea.    Mouth/Throat:   MMM, no pharyngeal erythema. No peritonsillar mass.    Neck:   No meningismus. Full ROM Hematological/Lymphatic/Immunilogical:   No cervical lymphadenopathy. Cardiovascular:   RRR. Symmetric bilateral radial and DP pulses.  No murmurs.  Respiratory:   Normal respiratory effort without tachypnea/retractions. Breath sounds are clear and equal bilaterally. No wheezes/rales/rhonchi. Gastrointestinal:   Soft and nontender. Non distended. There is no CVA tenderness.  No rebound, rigidity, or guarding. Genitourinary:   deferred Musculoskeletal:   Normal range of motion in all extremities. No joint effusions.  No lower extremity tenderness.  No edema. Neurologic:   Normal speech and language.  Motor grossly intact. No gross focal neurologic deficits are appreciated.  Skin:    Skin is warm, dry and intact. No rash noted.  No petechiae, purpura, or bullae.  ____________________________________________    LABS (pertinent positives/negatives) (all labs ordered are listed, but  only abnormal results are displayed) Labs Reviewed  BASIC METABOLIC PANEL - Abnormal; Notable for the following:       Result Value   Glucose, Bld 100 (*)    Calcium 8.7 (*)    GFR calc non Af Amer 59 (*)    All other components within normal limits  CBC WITH DIFFERENTIAL/PLATELET   ____________________________________________   EKG  Interpreted by me Sinus rhythm rate of 86, normal axis and intervals. Left bundle branch block. No acute ischemic changes.  ____________________________________________    RADIOLOGY  Dg Neck Soft Tissue  Result Date: 06/30/2016 CLINICAL DATA:  Acute onset of throat pain and dysphagia. Initial encounter. EXAM: NECK SOFT TISSUES - 1+ VIEW COMPARISON:  CT myelogram of the cervical spine performed 09/28/2005 FINDINGS: Prevertebral soft tissues are within normal limits. The nasopharynx, oropharynx and hypopharynx are grossly unremarkable. The epiglottis is normal in thickness. The proximal trachea is unremarkable. The patient is status post anterior cervical spinal fusion at C4-C7. Anterior osteophyte formation is noted at C3-C4. The visualized lung apices are grossly clear. IMPRESSION: Unremarkable radiographs of the soft tissues of the neck. Electronically Signed   By: Garald Balding M.D.   On: 06/30/2016 22:28   Dg Chest 2 View  Result Date: 06/30/2016 CLINICAL DATA:  Dysphagia and throat pain for 4 days. EXAM: CHEST  2 VIEW COMPARISON:  Chest radiographs 05/28/2016. CT 4 days prior 06/22/2016 FINDINGS: The cardiomediastinal contours are normal. Atherosclerosis of the thoracic aorta. Moderate hiatal hernia. Small right pleural effusion on recent abdominal CT not well seen radiographically. Pulmonary vasculature is normal. No consolidation or pneumothorax. The bones are under mineralized. Chronic change about the right shoulder. No acute osseous abnormalities are seen. IMPRESSION: 1. No acute abnormality.  Thoracic aortic atherosclerosis. 2. Hiatal hernia.  Recent abdominal CT demonstrated esophageal thickening of the distal esophagus, possibly cause of patient's dysphasia. Electronically Signed   By: Jeb Levering M.D.   On: 06/30/2016 22:28    ____________________________________________   PROCEDURES Procedures  ____________________________________________   INITIAL IMPRESSION / ASSESSMENT AND PLAN / ED COURSE  Pertinent labs & imaging results that were available during my care of the patient were reviewed by me and considered in my medical decision making (see chart for details).  Patient sent to the ED for dysphasia. No acute symptoms, this is been going on for at least 4 days. It's compatible with her previous history of esophagitis from GERD and hiatal hernia. I gave the patient IV fluids for hydration as well as IV Protonix. Patient will need to follow up with ENT.Considering the patient's symptoms, medical history, and physical examination today, I have low suspicion for ACS, PE, TAD, pneumothorax, carditis, mediastinitis, pneumonia, CHF, or sepsis.  Low suspicion of food impaction. Patient's well appearing and calm and comfortable and suitable for outpatient follow-up.      ____________________________________________   FINAL CLINICAL IMPRESSION(S) / ED DIAGNOSES  Final diagnoses:  Dysphasia      New Prescriptions   No medications on file     Portions of this note were generated with dragon dictation software. Dictation errors may occur despite best attempts at proofreading.    Carrie Mew, MD 06/30/16 2329

## 2016-06-30 NOTE — ED Notes (Signed)
Patient transported to X-ray at this time 

## 2016-06-30 NOTE — ED Notes (Signed)
Pt arrives with 2 butterfly needles in place with 1L of NS infusing SQ. Pt also has an indwelling urethral catheter in place, urine collection bag and tubing is filled with turbid, cloudy, yellow colored urine with numerous clumps and areas of sediment visualized.

## 2016-07-05 ENCOUNTER — Encounter: Payer: Self-pay | Admitting: Urology

## 2016-07-05 ENCOUNTER — Ambulatory Visit (INDEPENDENT_AMBULATORY_CARE_PROVIDER_SITE_OTHER): Payer: Medicare Other | Admitting: Urology

## 2016-07-05 VITALS — BP 138/74 | HR 90 | Ht 64.0 in

## 2016-07-05 DIAGNOSIS — R339 Retention of urine, unspecified: Secondary | ICD-10-CM

## 2016-07-05 DIAGNOSIS — D4111 Neoplasm of uncertain behavior of right renal pelvis: Secondary | ICD-10-CM

## 2016-07-05 NOTE — Progress Notes (Signed)
07/05/2016 11:49 AM   Sherry [name] Sherry [name] Nov 25, 1927 [id number]  Referring provider: Center, [facility] [place], [place] [address]  Chief Complaint  Patient presents with  . 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   1)right collecting system mass- Patient was admitted 03/09/2016 with chest pain likely due to a pleural effusion and history of PE. She is on Eliquis. CT scan of the abdomen and pelvis was obtained which showed some thickening of the right upper pole collecting system and a possible low-density lesion with speckled calcification in the right renal pelvis - neoplasm versus inflammation versus clot.There was no hydronephrosis. She denies any urologic history. she's had no gross hematuria. Repeat CT shows similar findings as previous study with filling defect remaining in the right renal pelvis concerning for TCC.  Patient does have recent PE 3-4 months ago. However, she is doing much better. She still Ellik was. She no longer is on home oxygen. She is being discharged for a nursing facility to her home today.  2) hx of urinary retention-on admission to hospital on 03/09/2016 a distended bladder was noted on the CT scan and a Foley catheter was placed. She was started on tamsulosin. Urine culture grew greater than 100,000 Enterobacter and she was treated with antibiotics. Foley d/c-ed in hospital. PVR today 1,000 cc. Has no urge to void currently. Denies frequent UTIs     PMH: Past Medical History:  Diagnosis Date  . Diabetes mellitus without complication ([place])   . Hyperlipidemia   . Hypertension   . Pulmonary embolism [facility])     Surgical History: Past Surgical History:  Procedure Laterality Date  . APPENDECTOMY    . BACK SURGERY    . ESOPHAGOGASTRODUODENOSCOPY N/A 05/29/2016   Procedure: ESOPHAGOGASTRODUODENOSCOPY (EGD);  Surgeon: Clarene Essex, MD;   Location: [place];  Service: Gastroenterology;  Laterality: N/A;  . SHOULDER SURGERY      Home Medications:  Allergies as of 07/05/2016   No Known Allergies     Medication List       Accurate as of 07/05/16 11:49 AM. Always use your most recent med list.          amLODipine 5 MG tablet Commonly known as:  NORVASC Take 5 mg by mouth daily.   apixaban 5 MG Tabs tablet Commonly known as:  ELIQUIS Take 1 tablet (5 mg total) by mouth 2 (two) times daily.   atorvastatin 20 MG tablet Commonly known as:  LIPITOR Take 20 mg by mouth at bedtime.   diphenhydrAMINE 25 MG tablet Commonly known as:  BENADRYL Take 25 mg by mouth daily as needed for itching or allergies.   feeding supplement (PRO-STAT SUGAR FREE 64) Liqd Take 30 mLs by mouth 3 (three) times daily.   ferrous sulfate 325 (65 FE) MG tablet Take 1 tablet (325 mg total) by mouth daily with breakfast.   gabapentin 300 MG capsule Commonly known as:  NEURONTIN Take 300 mg by mouth at bedtime.   Insulin Detemir 100 UNIT/ML Pen Commonly known as:  LEVEMIR Inject 8 Units into the skin every morning.   lisinopril-hydrochlorothiazide 20-12.5 MG tablet Commonly known as:  PRINZIDE,ZESTORETIC Take 2 tablets by mouth daily.   magnesium oxide 400 (241.3 Mg) MG tablet Commonly known as:  MAG-OX Take 1 tablet (400 mg total) by mouth daily.   meloxicam 7.5 MG tablet Commonly known as:  MOBIC Take  7.5 mg by mouth daily.   metoCLOPramide 5 MG tablet Commonly known as:  REGLAN Take 1 tablet (5 mg total) by mouth 2 (two) times daily.   multivitamin with minerals Tabs tablet Take 1 tablet by mouth daily.   oxyCODONE-acetaminophen 5-325 MG tablet Commonly known as:  PERCOCET/ROXICET Take 2 tablets by mouth 2 (two) times daily.   OXYGEN Inhale 2 L into the lungs continuous.   pantoprazole 40 MG tablet Commonly known as:  PROTONIX Take 1 tablet (40 mg total) by mouth 2 (two) times daily.   polyethylene glycol  packet Commonly known as:  MIRALAX Take 17 g by mouth daily as needed.   potassium chloride SA 20 MEQ tablet Commonly known as:  K-DUR,KLOR-CON Take 1 tablet (20 mEq total) by mouth daily.   sertraline 25 MG tablet Commonly known as:  ZOLOFT Take 1 tablet (25 mg total) by mouth daily.   sucralfate 1 GM/10ML suspension Commonly known as:  CARAFATE Take 10 mLs (1 g total) by mouth 4 (four) times daily -  with meals and at bedtime.   tamsulosin 0.4 MG Caps capsule Commonly known as:  FLOMAX Take 1 capsule (0.4 mg total) by mouth daily.       Allergies: No Known Allergies  Family History: Family History  Problem Relation Age of Onset  . Diabetes Mother   . Cancer Mother   . Cancer Father   . Bladder Cancer Neg Hx   . Kidney cancer Neg Hx     Social History:  reports that she has never smoked. She has never used smokeless tobacco. She reports that she does not drink alcohol or use drugs.  ROS: UROLOGY Frequent Urination?: No Hard to postpone urination?: No Burning/pain with urination?: No Get up at night to urinate?: No Leakage of urine?: No Urine stream starts and stops?: No Trouble starting stream?: No Do you have to strain to urinate?: No Blood in urine?: No Urinary tract infection?: No Sexually transmitted disease?: No Injury to kidneys or bladder?: No Painful intercourse?: No Weak stream?: No Currently pregnant?: No Vaginal bleeding?: No Last menstrual period?: n  Gastrointestinal Nausea?: No Vomiting?: No Indigestion/heartburn?: No Diarrhea?: No Constipation?: No  Constitutional Fever: No Night sweats?: No Weight loss?: No Fatigue?: No  Skin Skin rash/lesions?: No Itching?: No  Eyes Blurred vision?: No Double vision?: No  Ears/Nose/Throat Sore throat?: No Sinus problems?: No  Hematologic/Lymphatic Swollen glands?: No Easy bruising?: No  Cardiovascular Leg swelling?: No Chest pain?: No  Respiratory Cough?: No Shortness of  breath?: No  Endocrine Excessive thirst?: No  Musculoskeletal Back pain?: No Joint pain?: No  Neurological Headaches?: No Dizziness?: No  Psychologic Depression?: No Anxiety?: No  Physical Exam: BP 138/74 (BP Location: Left Arm, Patient Position: Sitting, Cuff Size: Normal)   Pulse 90   Ht 5\' 4"  (1.626 m)   Constitutional:  Alert and oriented, No acute distress. HEENT: [place] AT, moist mucus membranes.  Trachea midline, no masses. Cardiovascular: No clubbing, cyanosis, or edema. Respiratory: Normal respiratory effort, no increased work of breathing. GI: Abdomen is soft, nontender, nondistended, no abdominal masses GU: No CVA tenderness.  Skin: No rashes, bruises or suspicious lesions. Lymph: No cervical or inguinal adenopathy. Neurologic: Grossly intact, no focal deficits, moving all 4 extremities. Psychiatric: Normal mood and affect.  Laboratory Data: Lab Results  Component Value Date   WBC 5.9 06/30/2016   HGB 12.1 06/30/2016   HCT 36.5 06/30/2016   MCV 83.5 06/30/2016   PLT 304 06/30/2016  Lab Results  Component Value Date   CREATININE 0.86 06/30/2016    No results found for: PSA  No results found for: TESTOSTERONE  Lab Results  Component Value Date   HGBA1C 6.7 (H) 05/28/2016    Urinalysis    Component Value Date/Time   COLORURINE YELLOW 05/28/2016 1519   APPEARANCEUR CLOUDY (A) 05/28/2016 1519   APPEARANCEUR Clear 10/21/2012 1333   LABSPEC 1.011 05/28/2016 1519   LABSPEC 1.006 10/21/2012 1333   PHURINE 5.0 05/28/2016 1519   GLUCOSEU NEGATIVE 05/28/2016 1519   GLUCOSEU 50 mg/dL 10/21/2012 1333   HGBUR SMALL (A) 05/28/2016 1519   BILIRUBINUR NEGATIVE 05/28/2016 1519   BILIRUBINUR Negative 10/21/2012 1333   KETONESUR 20 (A) 05/28/2016 1519   PROTEINUR NEGATIVE 05/28/2016 1519   NITRITE NEGATIVE 05/28/2016 1519   LEUKOCYTESUR LARGE (A) 05/28/2016 1519   LEUKOCYTESUR Negative 10/21/2012 1333    Pertinent Imaging: CT urogram reviewed as  above  Assessment & Plan:   1)urinary retention - continue flomax. Patient passed trial of void today. We'll check a PVR at future follow-up appointments.  2) right collecting system mass-differential diagnosis includes hemorrhage, inflammation or neoplasm. At this point, the patient does appear to be a better surgical candidate than when previously seen. We will arrange for undergo cystoscopy, right ureteroscopy, right renal pelvis tumor biopsy, laser ablation of tumor, and right ureteral stent placement. We discussed the risks, benefits, indications of this procedure. She understands the risks include but not limited to bleeding, infection, iatrogenic injury, need for ureteral stent, need for repeat procedure.  Nickie Retort, MD  [facility] [address], [place], [place] [address] [phone]

## 2016-07-05 NOTE — Progress Notes (Signed)
Fill and Pull Catheter Removal  Patient is present today for a catheter removal. Was not able to instill water through existing cath. 87ml of water was then drained from the balloon.  A 16FR foley cath was removed from the bladder catheter was removed significant encrustation noted. A 14 FR red rubber was inserted and 87ml of dark yellow urine with sediment was drained. Patient was cleaned and prepped in a sterile fashion 133ml of sterile water/ saline was instilled into the bladder when the patient felt the urge to urinate. Patient as then given some time to void on their own.  Patient can void  325ml on their own after some time.  Patient tolerated well.  Preformed by: Fonnie Jarvis, CMA

## 2016-07-06 ENCOUNTER — Telehealth: Payer: Self-pay | Admitting: Radiology

## 2016-07-06 ENCOUNTER — Other Ambulatory Visit: Payer: Self-pay | Admitting: Radiology

## 2016-07-06 DIAGNOSIS — D4111 Neoplasm of uncertain behavior of right renal pelvis: Secondary | ICD-10-CM

## 2016-07-06 NOTE — Telephone Encounter (Signed)
LMOM. Need to discuss upcoming surgery information.

## 2016-07-07 NOTE — Telephone Encounter (Signed)
LMOM

## 2016-07-10 NOTE — Telephone Encounter (Signed)
Notified daughter, [name], of surgery scheduled on 07/28/16, pre-admit testing appt changed to 07/14/16 @10 :15 per daughter's request & to call day prior to surgery for arrival time to SDS. Advised pt to hold Eliquis beginning 07/26/16 per clearance obtained from Dr Beverlyn Roux. Questions answered to daughter's satisfaction. No further questions at this time. Daughter voices understanding.

## 2016-07-10 NOTE — Telephone Encounter (Signed)
LMOM

## 2016-07-10 NOTE — Telephone Encounter (Signed)
Surgery & pre-admit testing information mailed to pt.

## 2016-07-12 ENCOUNTER — Inpatient Hospital Stay
Admission: EM | Admit: 2016-07-12 | Discharge: 2016-07-18 | DRG: 392 | Disposition: A | Discharge status: Home / Self Care | Payer: Medicare Other | Attending: Internal Medicine | Admitting: Internal Medicine

## 2016-07-12 ENCOUNTER — Emergency Department: Payer: Medicare Other

## 2016-07-12 DIAGNOSIS — E876 Hypokalemia: Secondary | ICD-10-CM | POA: Diagnosis present

## 2016-07-12 DIAGNOSIS — R112 Nausea with vomiting, unspecified: Secondary | ICD-10-CM

## 2016-07-12 DIAGNOSIS — Z79899 Other long term (current) drug therapy: Secondary | ICD-10-CM

## 2016-07-12 DIAGNOSIS — N2889 Other specified disorders of kidney and ureter: Secondary | ICD-10-CM | POA: Diagnosis present

## 2016-07-12 DIAGNOSIS — I447 Left bundle-branch block, unspecified: Secondary | ICD-10-CM | POA: Diagnosis present

## 2016-07-12 DIAGNOSIS — E785 Hyperlipidemia, unspecified: Secondary | ICD-10-CM | POA: Diagnosis present

## 2016-07-12 DIAGNOSIS — R319 Hematuria, unspecified: Secondary | ICD-10-CM | POA: Diagnosis present

## 2016-07-12 DIAGNOSIS — Z809 Family history of malignant neoplasm, unspecified: Secondary | ICD-10-CM

## 2016-07-12 DIAGNOSIS — Z833 Family history of diabetes mellitus: Secondary | ICD-10-CM

## 2016-07-12 DIAGNOSIS — R748 Abnormal levels of other serum enzymes: Secondary | ICD-10-CM | POA: Diagnosis present

## 2016-07-12 DIAGNOSIS — I129 Hypertensive chronic kidney disease with stage 1 through stage 4 chronic kidney disease, or unspecified chronic kidney disease: Secondary | ICD-10-CM | POA: Diagnosis present

## 2016-07-12 DIAGNOSIS — Z9889 Other specified postprocedural states: Secondary | ICD-10-CM

## 2016-07-12 DIAGNOSIS — R109 Unspecified abdominal pain: Secondary | ICD-10-CM

## 2016-07-12 DIAGNOSIS — Z7982 Long term (current) use of aspirin: Secondary | ICD-10-CM

## 2016-07-12 DIAGNOSIS — Z7901 Long term (current) use of anticoagulants: Secondary | ICD-10-CM

## 2016-07-12 DIAGNOSIS — E119 Type 2 diabetes mellitus without complications: Secondary | ICD-10-CM | POA: Diagnosis present

## 2016-07-12 DIAGNOSIS — Z86711 Personal history of pulmonary embolism: Secondary | ICD-10-CM | POA: Diagnosis not present

## 2016-07-12 DIAGNOSIS — Z9049 Acquired absence of other specified parts of digestive tract: Secondary | ICD-10-CM

## 2016-07-12 DIAGNOSIS — E860 Dehydration: Secondary | ICD-10-CM | POA: Diagnosis present

## 2016-07-12 DIAGNOSIS — N179 Acute kidney failure, unspecified: Secondary | ICD-10-CM | POA: Diagnosis present

## 2016-07-12 DIAGNOSIS — I700 Atherosclerosis of aorta: Secondary | ICD-10-CM | POA: Diagnosis present

## 2016-07-12 DIAGNOSIS — R079 Chest pain, unspecified: Secondary | ICD-10-CM | POA: Diagnosis present

## 2016-07-12 DIAGNOSIS — K449 Diaphragmatic hernia without obstruction or gangrene: Secondary | ICD-10-CM | POA: Diagnosis present

## 2016-07-12 DIAGNOSIS — Z794 Long term (current) use of insulin: Secondary | ICD-10-CM

## 2016-07-12 DIAGNOSIS — K209 Esophagitis, unspecified: Secondary | ICD-10-CM | POA: Diagnosis not present

## 2016-07-12 DIAGNOSIS — Z515 Encounter for palliative care: Secondary | ICD-10-CM | POA: Diagnosis not present

## 2016-07-12 DIAGNOSIS — Z7189 Other specified counseling: Secondary | ICD-10-CM | POA: Diagnosis not present

## 2016-07-12 LAB — URINALYSIS, COMPLETE (UACMP) WITH MICROSCOPIC
BACTERIA UA: NONE SEEN
BILIRUBIN URINE: NEGATIVE
Glucose, UA: NEGATIVE mg/dL
KETONES UR: 5 mg/dL — AB
Nitrite: NEGATIVE
PROTEIN: 30 mg/dL — AB
Specific Gravity, Urine: 1.014 (ref 1.005–1.030)
pH: 5 (ref 5.0–8.0)

## 2016-07-12 LAB — BASIC METABOLIC PANEL
Anion gap: 12 (ref 5–15)
BUN: 18 mg/dL (ref 6–20)
CO2: 25 mmol/L (ref 22–32)
CREATININE: 1.34 mg/dL — AB (ref 0.44–1.00)
Calcium: 9 mg/dL (ref 8.9–10.3)
Chloride: 105 mmol/L (ref 101–111)
GFR calc non Af Amer: 35 mL/min — ABNORMAL LOW (ref >60)
GFR, EST AFRICAN AMERICAN: 40 mL/min — AB (ref >60)
GLUCOSE: 159 mg/dL — AB (ref 65–99)
Potassium: 3.3 mmol/L — ABNORMAL LOW (ref 3.5–5.1)
Sodium: 142 mmol/L (ref 135–145)

## 2016-07-12 LAB — TSH: TSH: 1.549 u[IU]/mL (ref 0.350–4.500)

## 2016-07-12 LAB — PHOSPHORUS: PHOSPHORUS: 3.8 mg/dL (ref 2.5–4.6)

## 2016-07-12 LAB — CBC
HCT: 39.6 % (ref 35.0–47.0)
Hemoglobin: 13.2 g/dL (ref 12.0–16.0)
MCH: 27.7 pg (ref 26.0–34.0)
MCHC: 33.3 g/dL (ref 32.0–36.0)
MCV: 83.3 fL (ref 80.0–100.0)
PLATELETS: 249 10*3/uL (ref 150–440)
RBC: 4.76 MIL/uL (ref 3.80–5.20)
RDW: 14.6 % — ABNORMAL HIGH (ref 11.5–14.5)
WBC: 4.8 10*3/uL (ref 3.6–11.0)

## 2016-07-12 LAB — CK: Total CK: 14 U/L — ABNORMAL LOW (ref 38–234)

## 2016-07-12 LAB — PROTIME-INR
INR: 0.92
Prothrombin Time: 12.4 seconds (ref 11.4–15.2)

## 2016-07-12 LAB — GLUCOSE, CAPILLARY: Glucose-Capillary: 108 mg/dL — ABNORMAL HIGH (ref 65–99)

## 2016-07-12 LAB — MAGNESIUM: Magnesium: 1.8 mg/dL (ref 1.7–2.4)

## 2016-07-12 LAB — TROPONIN I: TROPONIN I: 0.03 ng/mL — AB (ref <0.03)

## 2016-07-12 MED ORDER — ALBUTEROL SULFATE (2.5 MG/3ML) 0.083% IN NEBU: 2.5000 mg | INHALATION_SOLUTION | Freq: Four times a day (QID) | RESPIRATORY_TRACT | Status: DC | PRN

## 2016-07-12 MED ORDER — SODIUM CHLORIDE 0.9 % IV SOLN
INTRAVENOUS | Status: DC
  Administered 2016-07-12 – 2016-07-15 (×4): via INTRAVENOUS

## 2016-07-12 MED ORDER — PANTOPRAZOLE SODIUM 40 MG PO TBEC
40.0000 mg | DELAYED_RELEASE_TABLET | Freq: Every day | ORAL | Status: DC
  Filled 2016-07-12: qty 1

## 2016-07-12 MED ORDER — INSULIN ASPART 100 UNIT/ML ~~LOC~~ SOLN: 0.0000 [IU] | Freq: Every day | SUBCUTANEOUS | Status: DC

## 2016-07-12 MED ORDER — POTASSIUM CHLORIDE CRYS ER 20 MEQ PO TBCR
40.0000 meq | EXTENDED_RELEASE_TABLET | Freq: Two times a day (BID) | ORAL | Status: AC
  Administered 2016-07-13: 40 meq via ORAL
  Filled 2016-07-12 (×3): qty 2

## 2016-07-12 MED ORDER — ACETAMINOPHEN 650 MG RE SUPP: 650.0000 mg | Freq: Four times a day (QID) | RECTAL | Status: DC | PRN

## 2016-07-12 MED ORDER — MORPHINE SULFATE (PF) 2 MG/ML IV SOLN: 1.0000 mg | INTRAVENOUS | Status: DC | PRN

## 2016-07-12 MED ORDER — ATORVASTATIN CALCIUM 20 MG PO TABS
20.0000 mg | ORAL_TABLET | Freq: Every day | ORAL | Status: DC
  Administered 2016-07-13 – 2016-07-17 (×4): 20 mg via ORAL
  Filled 2016-07-12 (×6): qty 1

## 2016-07-12 MED ORDER — HYDROCHLOROTHIAZIDE 12.5 MG PO CAPS
12.5000 mg | ORAL_CAPSULE | Freq: Every day | ORAL | Status: DC
  Administered 2016-07-13 – 2016-07-17 (×5): 12.5 mg via ORAL
  Filled 2016-07-12 (×5): qty 1

## 2016-07-12 MED ORDER — IPRATROPIUM BROMIDE 0.02 % IN SOLN: 0.5000 mg | Freq: Four times a day (QID) | RESPIRATORY_TRACT | Status: DC | PRN

## 2016-07-12 MED ORDER — ASPIRIN EC 81 MG PO TBEC
81.0000 mg | DELAYED_RELEASE_TABLET | Freq: Every day | ORAL | Status: DC
  Administered 2016-07-13 – 2016-07-18 (×6): 81 mg via ORAL
  Filled 2016-07-12 (×6): qty 1

## 2016-07-12 MED ORDER — ONDANSETRON HCL 4 MG/2ML IJ SOLN
4.0000 mg | Freq: Four times a day (QID) | INTRAMUSCULAR | Status: DC | PRN
  Administered 2016-07-12 – 2016-07-14 (×3): 4 mg via INTRAVENOUS
  Filled 2016-07-12 (×3): qty 2

## 2016-07-12 MED ORDER — POLYETHYLENE GLYCOL 3350 17 G PO PACK: 17.0000 g | PACK | Freq: Every day | ORAL | Status: DC | PRN

## 2016-07-12 MED ORDER — NITROGLYCERIN 0.4 MG SL SUBL: 0.4000 mg | SUBLINGUAL_TABLET | SUBLINGUAL | Status: DC | PRN

## 2016-07-12 MED ORDER — FERROUS SULFATE 325 (65 FE) MG PO TABS
325.0000 mg | ORAL_TABLET | Freq: Every day | ORAL | Status: DC
  Administered 2016-07-13 – 2016-07-18 (×6): 325 mg via ORAL
  Filled 2016-07-12 (×6): qty 1

## 2016-07-12 MED ORDER — MAGNESIUM OXIDE 400 (241.3 MG) MG PO TABS
400.0000 mg | ORAL_TABLET | Freq: Every day | ORAL | Status: DC
  Administered 2016-07-13 – 2016-07-18 (×6): 400 mg via ORAL
  Filled 2016-07-12 (×6): qty 1

## 2016-07-12 MED ORDER — ACETAMINOPHEN 325 MG PO TABS: 650.0000 mg | ORAL_TABLET | Freq: Four times a day (QID) | ORAL | Status: DC | PRN

## 2016-07-12 MED ORDER — OXYCODONE-ACETAMINOPHEN 5-325 MG PO TABS
2.0000 | ORAL_TABLET | Freq: Two times a day (BID) | ORAL | Status: DC
  Administered 2016-07-13: 2 via ORAL
  Filled 2016-07-12 (×2): qty 2

## 2016-07-12 MED ORDER — AMLODIPINE BESYLATE 5 MG PO TABS
5.0000 mg | ORAL_TABLET | Freq: Every day | ORAL | Status: DC
  Administered 2016-07-13 – 2016-07-17 (×5): 5 mg via ORAL
  Filled 2016-07-12 (×5): qty 1

## 2016-07-12 MED ORDER — INSULIN ASPART 100 UNIT/ML ~~LOC~~ SOLN
0.0000 [IU] | Freq: Three times a day (TID) | SUBCUTANEOUS | Status: DC
  Administered 2016-07-13: 1 [IU] via SUBCUTANEOUS
  Administered 2016-07-16: 2 [IU] via SUBCUTANEOUS
  Administered 2016-07-16: 1 [IU] via SUBCUTANEOUS
  Administered 2016-07-17: 2 [IU] via SUBCUTANEOUS
  Administered 2016-07-17 – 2016-07-18 (×3): 1 [IU] via SUBCUTANEOUS
  Administered 2016-07-18: 2 [IU] via SUBCUTANEOUS
  Filled 2016-07-12 (×8): qty 1

## 2016-07-12 MED ORDER — ADULT MULTIVITAMIN W/MINERALS CH
1.0000 | ORAL_TABLET | Freq: Every day | ORAL | Status: DC
  Administered 2016-07-13 – 2016-07-18 (×6): 1 via ORAL
  Filled 2016-07-12 (×6): qty 1

## 2016-07-12 MED ORDER — ASPIRIN 81 MG PO CHEW
324.0000 mg | CHEWABLE_TABLET | Freq: Once | ORAL | Status: AC
Start: 2016-07-12 — End: 2016-07-12
  Administered 2016-07-12: 324 mg via ORAL
  Filled 2016-07-12: qty 4

## 2016-07-12 MED ORDER — INSULIN DETEMIR 100 UNIT/ML ~~LOC~~ SOLN
8.0000 [IU] | Freq: Every morning | SUBCUTANEOUS | Status: DC
  Administered 2016-07-13: 8 [IU] via SUBCUTANEOUS
  Filled 2016-07-12: qty 0.08

## 2016-07-12 MED ORDER — POTASSIUM CHLORIDE CRYS ER 20 MEQ PO TBCR
20.0000 meq | EXTENDED_RELEASE_TABLET | Freq: Every day | ORAL | Status: DC
  Administered 2016-07-13 – 2016-07-14 (×2): 20 meq via ORAL
  Filled 2016-07-12 (×2): qty 1

## 2016-07-12 MED ORDER — TAMSULOSIN HCL 0.4 MG PO CAPS
0.4000 mg | ORAL_CAPSULE | Freq: Every day | ORAL | Status: DC
  Administered 2016-07-13 – 2016-07-18 (×5): 0.4 mg via ORAL
  Filled 2016-07-12 (×5): qty 1

## 2016-07-12 MED ORDER — APIXABAN 5 MG PO TABS
10.0000 mg | ORAL_TABLET | Freq: Two times a day (BID) | ORAL | Status: DC
  Filled 2016-07-12 (×2): qty 2

## 2016-07-12 MED ORDER — GABAPENTIN 300 MG PO CAPS
300.0000 mg | ORAL_CAPSULE | Freq: Every day | ORAL | Status: DC
  Administered 2016-07-13 – 2016-07-17 (×4): 300 mg via ORAL
  Filled 2016-07-12 (×6): qty 1

## 2016-07-12 MED ORDER — ONDANSETRON HCL 4 MG PO TABS
4.0000 mg | ORAL_TABLET | Freq: Four times a day (QID) | ORAL | Status: DC | PRN
  Administered 2016-07-16: 4 mg via ORAL
  Filled 2016-07-12: qty 1

## 2016-07-12 MED ORDER — SERTRALINE HCL 50 MG PO TABS
25.0000 mg | ORAL_TABLET | Freq: Every day | ORAL | Status: DC
  Administered 2016-07-13 – 2016-07-18 (×6): 25 mg via ORAL
  Filled 2016-07-12 (×6): qty 1

## 2016-07-12 MED ORDER — SUCRALFATE 1 GM/10ML PO SUSP
1.0000 g | Freq: Three times a day (TID) | ORAL | Status: DC
  Administered 2016-07-13 – 2016-07-18 (×20): 1 g via ORAL
  Filled 2016-07-12 (×24): qty 10

## 2016-07-12 NOTE — ED Notes (Signed)
Dr Jacqualine Code notified of troponin 0.03 - no new orders

## 2016-07-12 NOTE — ED Triage Notes (Signed)
Pt arrived via ems for c/o weakness and n/v - pt has not eaten or drank in the last 3 days - she reports that she is having chest pain over the left side of her chest and shortness of breath - symptoms have been present for the last 3 days - pt was given zofran in route and still c/o nausea at this time - pt color is pale - she is a&o x4 - Dr Kennon Rounds has assessed pt

## 2016-07-12 NOTE — Progress Notes (Signed)
Patient arrived to 2A Room 240. Patient denies pain and all questions answered. Patient oriented to unit and Fall Safety Plan signed. Skin assessment completed with Wendelyn Breslow RN and healed stage II ulcer noted on mid bilateral buttocks. A&Ox4, VSS, and NSR on verified tele-box #[id number]. Nursing staff will continue to monitor for any changes in patient status. Earleen Reaper, RN

## 2016-07-12 NOTE — ED Notes (Signed)
Patient transported to CT 

## 2016-07-12 NOTE — H&P (Signed)
History and Physical   [facility] - [place] @ [facility] Admission History and Physical [employer], D.O.    Patient Name: Sherry [name] MR#: [id number] Date of Birth: 07/26/1927 Date of Admission: 07/12/2016  Referring MD/NP/PA: Dr. Jacqualine Code Primary Care Physician: Center, [facility] Patient coming from: Home  Chief Complaint:  Chief Complaint  Patient presents with  . Weakness  . Chest Pain    HPI: Sherry [name] is a 83 y.o. female with a known history of DM, HTN, HLD, PE presents to the emergency department for evaluation of weakness, chest pain, decreased appetite.  Patient was in a usual state of health until The past 3-4 days when she has had decreased appetite secondary to abdominal pain, constipation and severe weakness. She reports that she left rehabilitation 4 days ago and has not felt well since. She describes central chest pressure which has been intermittent and not associated with any palpitations, shortness of breath or diaphoresis however she has had nausea ongoing..  Patient denies fevers/chills, dizziness, shortness of breath, , abdominal pain, dysuria/frequency, changes in mental status.    Of note she was seen in the emergency department on June 22 for dysphasia and discharged to rehabilitation Otherwise there has been no change in status.   EMS/ED Course: Patient received aspirin. Medical admission was requested for ongoing management and workup of acute kidney injury secondary to dehydration, chest pain with elevated troponin and left bundle branch block  Review of Systems:  CONSTITUTIONAL: Positive generalized weakness and fatigue. No fever/chills, , weight gain/loss, headache. EYES: No blurry or double vision. ENT: No tinnitus, postnasal drip, redness or soreness of the oropharynx. RESPIRATORY: No cough, dyspnea, wheeze.  No hemoptysis.  CARDIOVASCULAR: No chest pain, palpitations, syncope, orthopnea. No lower extremity edema.  GASTROINTESTINAL:  Positive decreased appetite, nausea, constipation. No vomiting, abdominal pain, diarrhea,  No hematemesis, melena or hematochezia. GENITOURINARY: No dysuria, frequency, hematuria. ENDOCRINE: No polyuria or nocturia. No heat or cold intolerance. HEMATOLOGY: No anemia, bruising, bleeding. INTEGUMENTARY: No rashes, ulcers, lesions. MUSCULOSKELETAL: No arthritis, gout, dyspnea. NEUROLOGIC: No numbness, tingling, ataxia, seizure-type activity, weakness. PSYCHIATRIC: No anxiety, depression, insomnia.   Past Medical History:  Diagnosis Date  . Diabetes mellitus without complication ([place])   . Hyperlipidemia   . Hypertension   . Pulmonary embolism [facility])     Past Surgical History:  Procedure Laterality Date  . APPENDECTOMY    . BACK SURGERY    . ESOPHAGOGASTRODUODENOSCOPY N/A 05/29/2016   Procedure: ESOPHAGOGASTRODUODENOSCOPY (EGD);  Surgeon: Clarene Essex, MD;  Location: [place];  Service: Gastroenterology;  Laterality: N/A;  . SHOULDER SURGERY       reports that she has never smoked. She has never used smokeless tobacco. She reports that she does not drink alcohol or use drugs.  No Known Allergies  Family History  Problem Relation Age of Onset  . Diabetes Mother   . Cancer Mother   . Cancer Father   . Bladder Cancer Neg Hx   . Kidney cancer Neg Hx     Prior to Admission medications   Medication Sig Start Date End Date Taking? Authorizing Provider  Amino Acids-Protein Hydrolys (FEEDING SUPPLEMENT, PRO-STAT SUGAR FREE 64,) LIQD Take 30 mLs by mouth 3 (three) times daily. Patient not taking: Reported on 07/05/2016 06/02/16   Shela Leff, MD  amLODipine (NORVASC) 5 MG tablet Take 5 mg by mouth daily.    [provider]  apixaban (ELIQUIS) 5 MG TABS tablet Take 1 tablet (5 mg total) by mouth 2 (  two) times daily. Patient taking differently: Take 10 mg by mouth 2 (two) times daily.  03/08/16   Isaac Bliss, Rayford Halsted, MD  atorvastatin (LIPITOR) 20 MG tablet Take 20  mg by mouth at bedtime.     [provider]  diphenhydrAMINE (BENADRYL) 25 MG tablet Take 25 mg by mouth daily as needed for itching or allergies.    [provider]  ferrous sulfate 325 (65 FE) MG tablet Take 1 tablet (325 mg total) by mouth daily with breakfast. 06/03/16   Lorella Nimrod, MD  gabapentin (NEURONTIN) 300 MG capsule Take 300 mg by mouth at bedtime.     [provider]  Insulin Detemir (LEVEMIR) 100 UNIT/ML Pen Inject 8 Units into the skin every morning. 06/02/16   Lorella Nimrod, MD  lisinopril-hydrochlorothiazide (PRINZIDE,ZESTORETIC) 20-12.5 MG tablet Take 2 tablets by mouth daily.     [provider]  magnesium oxide (MAG-OX) 400 (241.3 Mg) MG tablet Take 1 tablet (400 mg total) by mouth daily. 04/27/16   Loletha Grayer, MD  meloxicam (MOBIC) 7.5 MG tablet Take 7.5 mg by mouth daily.    [provider]  metoCLOPramide (REGLAN) 5 MG tablet Take 1 tablet (5 mg total) by mouth 2 (two) times daily. 06/02/16   Lorella Nimrod, MD  Multiple Vitamin (MULTIVITAMIN WITH MINERALS) TABS tablet Take 1 tablet by mouth daily. 06/02/16   Shela Leff, MD  oxyCODONE-acetaminophen (PERCOCET/ROXICET) 5-325 MG tablet Take 2 tablets by mouth 2 (two) times daily.    [provider]  OXYGEN Inhale 2 L into the lungs continuous.    [provider]  pantoprazole (PROTONIX) 40 MG tablet Take 1 tablet (40 mg total) by mouth 2 (two) times daily. Patient taking differently: Take 40 mg by mouth daily.  04/27/16   Loletha Grayer, MD  polyethylene glycol [facility]) packet Take 17 g by mouth daily as needed. 04/27/16   Loletha Grayer, MD  potassium chloride (K-DUR,KLOR-CON) 20 MEQ tablet Take 1 tablet (20 mEq total) by mouth daily. 06/02/16   Lorella Nimrod, MD  sertraline (ZOLOFT) 25 MG tablet Take 1 tablet (25 mg total) by mouth daily. 06/02/16   Lorella Nimrod, MD  sucralfate (CARAFATE) 1 GM/10ML suspension Take 10 mLs (1 g total) by mouth 4 (four)  times daily -  with meals and at bedtime. 06/02/16   Lorella Nimrod, MD  tamsulosin (FLOMAX) 0.4 MG CAPS capsule Take 1 capsule (0.4 mg total) by mouth daily. Patient taking differently: Take 0.4 mg by mouth daily after lunch.  03/14/16   Fritzi Mandes, MD    Physical Exam: Vitals:   07/12/16 1850 07/12/16 1854  SpO2: 96%   Weight:  54 kg (119 lb)  Height:  5\' 4"  (1.626 m)    GENERAL: 83 y.o.-year-old Frail female patient, well-developed, lying in the bed in no acute distress.  Pleasant and cooperative.   HEENT: Head atraumatic, normocephalic. Pupils equal, round, reactive to light and accommodation. No scleral icterus. Extraocular muscles intact. Nares are patent. Oropharynx is clear. Mucus membranes dry NECK: Supple, full range of motion. No JVD, no bruit heard. No thyroid enlargement, no tenderness, no cervical lymphadenopathy. CHEST: Normal breath sounds bilaterally. No wheezing, rales, rhonchi or crackles. No use of accessory muscles of respiration.  No reproducible chest wall tenderness.  CARDIOVASCULAR: S1, S2 normal. No murmurs, rubs, or gallops. Cap refill <2 seconds. Pulses intact distally.  ABDOMEN: Soft, nondistended, positive tenderness diffusely. Decreased bowel sounds No rebound, guarding, rigidity.  EXTREMITIES: No pedal edema, cyanosis, or  clubbing. No calf tenderness or Homan's sign.  NEUROLOGIC: The patient is alert and oriented x 3. Cranial nerves II through XII are grossly intact with no focal sensorimotor deficit. Muscle strength 5/5 in all extremities. Sensation intact. Gait not checked. PSYCHIATRIC:  Normal affect, mood, thought content. SKIN: Warm, dry, and intact without obvious rash, lesion, or ulcer.    Labs on Admission:  CBC:  Recent Labs Lab 07/12/16 1916  WBC 4.8  HGB 13.2  HCT 39.6  MCV 83.3  PLT 423   Basic Metabolic Panel:  Recent Labs Lab 07/12/16 1916  NA 142  K 3.3*  CL 105  CO2 25  GLUCOSE 159*  BUN 18  CREATININE 1.34*  CALCIUM 9.0    GFR: Estimated Creatinine Clearance: 25.7 mL/min (A) (by C-G formula based on SCr of 1.34 mg/dL (H)). Liver Function Tests: No results for input(s): AST, ALT, ALKPHOS, BILITOT, PROT, ALBUMIN in the last 168 hours. No results for input(s): LIPASE, AMYLASE in the last 168 hours. No results for input(s): AMMONIA in the last 168 hours. Coagulation Profile:  Recent Labs Lab 07/12/16 1902  INR 0.92   Cardiac Enzymes:  Recent Labs Lab 07/12/16 1902 07/12/16 1916  CKTOTAL 14*  --   TROPONINI  --  0.03*   BNP (last 3 results) No results for input(s): PROBNP in the last 8760 hours. HbA1C: No results for input(s): HGBA1C in the last 72 hours. CBG: No results for input(s): GLUCAP in the last 168 hours. Lipid Profile: No results for input(s): CHOL, HDL, LDLCALC, TRIG, CHOLHDL, LDLDIRECT in the last 72 hours. Thyroid Function Tests: No results for input(s): TSH, T4TOTAL, FREET4, T3FREE, THYROIDAB in the last 72 hours. Anemia Panel: No results for input(s): VITAMINB12, FOLATE, FERRITIN, TIBC, IRON, RETICCTPCT in the last 72 hours. Urine analysis:    Component Value Date/Time   COLORURINE AMBER (A) 07/12/2016 1902   APPEARANCEUR CLOUDY (A) 07/12/2016 1902   APPEARANCEUR Clear 10/21/2012 1333   LABSPEC 1.014 07/12/2016 1902   LABSPEC 1.006 10/21/2012 1333   PHURINE 5.0 07/12/2016 1902   GLUCOSEU NEGATIVE 07/12/2016 1902   GLUCOSEU 50 mg/dL 10/21/2012 1333   HGBUR LARGE (A) 07/12/2016 1902   BILIRUBINUR NEGATIVE 07/12/2016 1902   BILIRUBINUR Negative 10/21/2012 1333   KETONESUR 5 (A) 07/12/2016 1902   PROTEINUR 30 (A) 07/12/2016 1902   NITRITE NEGATIVE 07/12/2016 1902   LEUKOCYTESUR TRACE (A) 07/12/2016 1902   LEUKOCYTESUR Negative 10/21/2012 1333   Sepsis Labs: @LABRCNTIP (procalcitonin:4,lacticidven:4) )No results found for this or any previous visit (from the past 240 hour(s)).   Radiological Exams on Admission: Dg Chest 2 View  Result Date: 07/12/2016 CLINICAL DATA:   Chest pain. Weakness, nausea and vomiting. No p.o. intake for 3 days. EXAM: CHEST  2 VIEW COMPARISON:  Most recent comparison radiograph 06/30/2016. Chest CT 03/09/2016 FINDINGS: The cardiomediastinal contours are normal. Again seen atherosclerosis of thoracic aorta. Hiatal hernia. Minimal basilar atelectasis. Pulmonary vasculature is normal. No consolidation, pleural effusion, or pneumothorax. No acute osseous abnormalities are seen. Chronic widening of right acromioclavicular joint. Bones are under mineralized. IMPRESSION: No acute abnormality. Electronically Signed   By: Jeb Levering M.D.   On: 07/12/2016 19:44   Ct Head Wo Contrast  Result Date: 07/12/2016 CLINICAL DATA:  Generalized weakness with nausea and vomiting. EXAM: CT HEAD WITHOUT CONTRAST TECHNIQUE: Contiguous axial images were obtained from the base of the skull through the vertex without intravenous contrast. COMPARISON:  Brain MRI 04/19/2016.  Head CT 04/18/2016 FINDINGS: Brain: No evidence of acute infarction,  hemorrhage, hydrocephalus, extra-axial collection or mass lesion/mass effect. Mild to moderate for age atrophy, most prominent in the frontal lobes. 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 Extensive chronic microvascular ischemic gliosis in the cerebral white matter. Vascular: Atherosclerotic calcification.  No hyperdense vessel. Skull: No acute finding Sinuses/Orbits: No acute finding.  Bilateral cataract resection. IMPRESSION: 1. No acute finding or change from prior. 2. Chronic small vessel ischemic injury. Electronically Signed   By: Monte Fantasia M.D.   On: 07/12/2016 20:21    EKG: Normal sinus rhythm at 90 bpm with normal axis, left bundle branch block, inferior ST depressions and nonspecific ST-T wave changes.   Assessment/Plan  This is a 83 y.o. female with a history of diabetes, hypertension, hyperlipidemia and PE now being admitted with:  #. Chest pain, elevated troponin, left  bundle-branch block rule out ACS - Admit to inpatient with telemetry monitoring. - Trend troponins, check lipids and TSH. - Morphine, nitro, aspirin and statin ordered.   - Check echo - Cardiology consultation has been requested.  #. Acute kidney injury likely secondary to dehydration/gastritis - IV fluids and repeat BMP in AM.  - Avoid nephrotoxic medications: Hold lisinopril, mobile back - Bladder scan and place foley catheter if evidence of urinary retention -Antiemetics as needed. -IV Protonix  #. Hypokalemia, mild -Replace by mouth and recheck BMP in a.m.  #. Hematuria and renal mass - Consider urology evaluation - Repeat UA  #. History of Diabetes - Accuchecks achs with RISS coverage - Heart healthy, carb controlled diet -Continue Levemir  #. History of PE - Continue Eliquis  #. History of hyperlipidemia - Continue Lipitor   #. History of hypertension - Continue Norvasc, HCTZ -Hold lisinopril  Admission status: Inpatient IV Fluids: Normal saline Diet/Nutrition: Heart healthy, carb controlled Consults called: None  DVT Px: Eliquis, SCDs and early ambulation. Code Status: Full Code  Disposition Plan: To home in 2-3 days  All the records are reviewed and case discussed with ED provider. Management plans discussed with the patient and/or family who express understanding and agree with plan of care.  Allea Kassner D.O. on 07/12/2016 at 9:23 PM Between 7am to 6pm - Pager - [phone] After 6pm go to [email or web address] - [employer] [facility] Office [phone] CC: Primary care physician; Center, [facility]   07/12/2016, 9:23 PM

## 2016-07-12 NOTE — ED Provider Notes (Signed)
[facility] Emergency Department Provider Note   ____________________________________________   First MD Initiated Contact with Patient 07/12/16 1900     (approximate)  I have reviewed the triage vital signs and the nursing notes.   HISTORY  Chief Complaint Weakness and Chest Pain    HPI Sherry [name] is a 83 y.o. female reports for 3 days she's been so weak and tired that she has not been able to get up, she's felt weak and has not had anything to eat or drink for 3 days. She reports she left the nursing home or rehabilitation about 4 days ago.   Describes a very light feeling of discomfort across her mid chest. Described as mild. And present consistently for 3 days  She reports that she's having a mild discomfort across her chest that is been present for 3 days. Nausea and weakness.  Denies changes with speech. Denies any weakness in her arms or legs. No shortness of breath.  Past Medical History:  Diagnosis Date  . Diabetes mellitus without complication ([place])   . Hyperlipidemia   . Hypertension   . Pulmonary embolism [facility])     Patient Active Problem List   Diagnosis Date Noted  . Reactive depression   . Esophagitis determined by endoscopy 05/31/2016  . Hyperglycemia   . Hypokalemia   . Cystitis   . HH (hiatus hernia)   . Hiatal hernia with gastroesophageal reflux disease without esophagitis   . GI bleed 05/28/2016  . Upper GI bleeding 05/28/2016  . Pressure injury of skin 05/28/2016  . Atrial flutter ([place])   . Gastroenteritis 04/24/2016  . Aphasia 04/18/2016  . Nausea & vomiting 03/27/2016  . Advance care planning   . UTI (urinary tract infection) 03/12/2016  . Chest pain 03/09/2016  . PE (pulmonary thromboembolism) ([place]) 02/26/2016  . Diabetes mellitus type 2, insulin dependent ([place]) 02/26/2016  . Anemia 02/26/2016  . Chronic kidney disease (CKD), stage III (moderate) 02/26/2016  . Essential hypertension 02/26/2016  . Goals of  care, counseling/discussion   . Adult failure to thrive syndrome   . Palliative care by specialist   . Sepsis ([place]) 11/26/2014    Past Surgical History:  Procedure Laterality Date  . APPENDECTOMY    . BACK SURGERY    . ESOPHAGOGASTRODUODENOSCOPY N/A 05/29/2016   Procedure: ESOPHAGOGASTRODUODENOSCOPY (EGD);  Surgeon: Clarene Essex, MD;  Location: [place];  Service: Gastroenterology;  Laterality: N/A;  . SHOULDER SURGERY      Prior to Admission medications   Medication Sig Start Date End Date Taking? Authorizing Provider  Amino Acids-Protein Hydrolys (FEEDING SUPPLEMENT, PRO-STAT SUGAR FREE 64,) LIQD Take 30 mLs by mouth 3 (three) times daily. Patient not taking: Reported on 07/05/2016 06/02/16   Shela Leff, MD  amLODipine (NORVASC) 5 MG tablet Take 5 mg by mouth daily.    [provider]  apixaban (ELIQUIS) 5 MG TABS tablet Take 1 tablet (5 mg total) by mouth 2 (two) times daily. Patient taking differently: Take 10 mg by mouth 2 (two) times daily.  03/08/16   Isaac Bliss, Rayford Halsted, MD  atorvastatin (LIPITOR) 20 MG tablet Take 20 mg by mouth at bedtime.     [provider]  diphenhydrAMINE (BENADRYL) 25 MG tablet Take 25 mg by mouth daily as needed for itching or allergies.    [provider]  ferrous sulfate 325 (65 FE) MG tablet Take 1 tablet (325 mg total) by mouth daily with breakfast. 06/03/16   Lorella Nimrod, MD  gabapentin (NEURONTIN) 300 MG capsule Take 300 mg by mouth at bedtime.     [provider]  Insulin Detemir (LEVEMIR) 100 UNIT/ML Pen Inject 8 Units into the skin every morning. 06/02/16   Lorella Nimrod, MD  lisinopril-hydrochlorothiazide (PRINZIDE,ZESTORETIC) 20-12.5 MG tablet Take 2 tablets by mouth daily.     [provider]  magnesium oxide (MAG-OX) 400 (241.3 Mg) MG tablet Take 1 tablet (400 mg total) by mouth daily. 04/27/16   Loletha Grayer, MD  meloxicam (MOBIC) 7.5 MG tablet Take 7.5 mg by mouth daily.     [provider]  metoCLOPramide (REGLAN) 5 MG tablet Take 1 tablet (5 mg total) by mouth 2 (two) times daily. 06/02/16   Lorella Nimrod, MD  Multiple Vitamin (MULTIVITAMIN WITH MINERALS) TABS tablet Take 1 tablet by mouth daily. 06/02/16   Shela Leff, MD  oxyCODONE-acetaminophen (PERCOCET/ROXICET) 5-325 MG tablet Take 2 tablets by mouth 2 (two) times daily.    [provider]  OXYGEN Inhale 2 L into the lungs continuous.    [provider]  pantoprazole (PROTONIX) 40 MG tablet Take 1 tablet (40 mg total) by mouth 2 (two) times daily. Patient taking differently: Take 40 mg by mouth daily.  04/27/16   Loletha Grayer, MD  polyethylene glycol [facility]) packet Take 17 g by mouth daily as needed. 04/27/16   Loletha Grayer, MD  potassium chloride (K-DUR,KLOR-CON) 20 MEQ tablet Take 1 tablet (20 mEq total) by mouth daily. 06/02/16   Lorella Nimrod, MD  sertraline (ZOLOFT) 25 MG tablet Take 1 tablet (25 mg total) by mouth daily. 06/02/16   Lorella Nimrod, MD  sucralfate (CARAFATE) 1 GM/10ML suspension Take 10 mLs (1 g total) by mouth 4 (four) times daily -  with meals and at bedtime. 06/02/16   Lorella Nimrod, MD  tamsulosin (FLOMAX) 0.4 MG CAPS capsule Take 1 capsule (0.4 mg total) by mouth daily. Patient taking differently: Take 0.4 mg by mouth daily after lunch.  03/14/16   Fritzi Mandes, MD    Allergies Patient has no known allergies.  Family History  Problem Relation Age of Onset  . Diabetes Mother   . Cancer Mother   . Cancer Father   . Bladder Cancer Neg Hx   . Kidney cancer Neg Hx     Social History Social History  Substance Use Topics  . Smoking status: Never Smoker  . Smokeless tobacco: Never Used  . Alcohol use No    Review of Systems Constitutional: No fever/chills Eyes: No visual changes. ENT: No sore throat. Cardiovascular: See history of present illness Respiratory: Denies shortness of breath. Gastrointestinal: No abdominal pain.  No nausea, no  vomiting. First she said and no appetite  No diarrhea.  No constipation. Genitourinary: Negative for dysuria. Musculoskeletal: Negative for back pain. Skin: Negative for rash. Neurological: Negative for headaches, focal weakness or numbness.    ____________________________________________   PHYSICAL EXAM:  VITAL SIGNS: ED Triage Vitals  Enc Vitals Group     BP --      Pulse --      Resp --      Temp --      Temp src --      SpO2 07/12/16 1850 96 %     Weight 07/12/16 1854 119 lb (54 kg)     Height 07/12/16 1854 5\' 4"  (1.626 m)     Head Circumference --      Peak Flow --      Pain Score 07/12/16 1854 8  Pain Loc --      Pain Edu? --      Excl. in [place]? --     Constitutional: Alert and oriented. Fatigued, somewhat ill-appearing but in no acute distress. Eyes: Conjunctivae are normal. Head: Atraumatic. Nose: No congestion/rhinnorhea. Mouth/Throat: Mucous membranes are very dry. Neck: No stridor.   Cardiovascular: Normal rate, regular rhythm. Grossly normal heart sounds.  Good peripheral circulation. Respiratory: Normal respiratory effort.  No retractions. Lungs CTAB. Gastrointestinal: Soft and nontender. No distention. Musculoskeletal: No lower extremity tenderness nor edema. She demonstrates generalized weakness in all extremities, greater in the legs than in the arms but reports she has difficulty walking at baseline Neurologic:  Normal speech and language. No gross focal neurologic deficits are appreciated.  Skin:  Skin is warm, dry and intact. No rash noted. Psychiatric: Mood and affect are normal. Speech and behavior are normal.  ____________________________________________   LABS (all labs ordered are listed, but only abnormal results are displayed)  Labs Reviewed  BASIC METABOLIC PANEL - Abnormal; Notable for the following:       Result Value   Potassium 3.3 (*)    Glucose, Bld 159 (*)    Creatinine, Ser 1.34 (*)    GFR calc non Af Amer 35 (*)    GFR  calc Af Amer 40 (*)    All other components within normal limits  CBC - Abnormal; Notable for the following:    RDW 14.6 (*)    All other components within normal limits  TROPONIN I - Abnormal; Notable for the following:    Troponin I 0.03 (*)    All other components within normal limits  CK - Abnormal; Notable for the following:    Total CK 14 (*)    All other components within normal limits  PROTIME-INR  URINALYSIS, COMPLETE (UACMP) WITH MICROSCOPIC   ____________________________________________  EKG  Reviewed and interpreted by me at 1850 Heart rate 90 Chris 140 QTc 510 Normal sinus rhythm, left bundle branch block appearance. No scarbosa criteria. However, notable depressions in inferior distribution could be indicative of ischemia. No STEMI. ____________________________________________  RADIOLOGY  Dg Chest 2 View  Result Date: 07/12/2016 CLINICAL DATA:  Chest pain. Weakness, nausea and vomiting. No p.o. intake for 3 days. EXAM: CHEST  2 VIEW COMPARISON:  Most recent comparison radiograph 06/30/2016. Chest CT 03/09/2016 FINDINGS: The cardiomediastinal contours are normal. Again seen atherosclerosis of thoracic aorta. Hiatal hernia. Minimal basilar atelectasis. Pulmonary vasculature is normal. No consolidation, pleural effusion, or pneumothorax. No acute osseous abnormalities are seen. Chronic widening of right acromioclavicular joint. Bones are under mineralized. IMPRESSION: No acute abnormality. Electronically Signed   By: Jeb Levering M.D.   On: 07/12/2016 19:44   Ct Head Wo Contrast  Result Date: 07/12/2016 CLINICAL DATA:  Generalized weakness with nausea and vomiting. EXAM: CT HEAD WITHOUT CONTRAST TECHNIQUE: Contiguous axial images were obtained from the base of the skull through the vertex without intravenous contrast. COMPARISON:  Brain MRI 04/19/2016.  Head CT 04/18/2016 FINDINGS: Brain: No evidence of acute infarction, hemorrhage, hydrocephalus, extra-axial collection  or mass lesion/mass effect. Mild to moderate for age atrophy, most prominent in the frontal lobes. 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 Extensive chronic microvascular ischemic gliosis in the cerebral white matter. Vascular: Atherosclerotic calcification.  No hyperdense vessel. Skull: No acute finding Sinuses/Orbits: No acute finding.  Bilateral cataract resection. IMPRESSION: 1. No acute finding or change from prior. 2. Chronic small vessel ischemic injury. Electronically Signed   By:  Monte Fantasia M.D.   On: 07/12/2016 20:21    ____________________________________________   PROCEDURES  Procedure(s) performed: None  Procedures  Critical Care performed: No  ____________________________________________   INITIAL IMPRESSION / ASSESSMENT AND PLAN / ED COURSE  Pertinent labs & imaging results that were available during my care of the patient were reviewed by me and considered in my medical decision making (see chart for details).  Patient presents for evaluation of fatigue and severe weakness for the last 3 days. She appears generally fatigued, and she does report anterior chest discomfort which has been consistent for 3 days. On exam she appears fatigued, but in no acute distress. She is alert and oriented at this time, EMS reports she had a staring or syncope type episode    ----------------------------------------- 8:30 PM on 07/12/2016 -----------------------------------------  Patient resting comfortably. Appears improved after light hydration. We'll admit to hospital, evidence of acute kidney injury, probable dehydration, also concern for slightly elevated troponin with EKG changes.  ____________________________________________   FINAL CLINICAL IMPRESSION(S) / ED DIAGNOSES  Final diagnoses:  Moderate risk chest pain  Dehydration  AKI (acute kidney injury) ([place])      NEW MEDICATIONS STARTED DURING THIS VISIT:  New Prescriptions   No  medications on file     Note:  This document was prepared using Dragon voice recognition software and may include unintentional dictation errors.     Delman Kitten, MD 07/12/16 2033

## 2016-07-13 ENCOUNTER — Inpatient Hospital Stay: Admit: 2016-07-13 | Payer: Medicare Other

## 2016-07-13 ENCOUNTER — Other Ambulatory Visit: Payer: Medicare Other

## 2016-07-13 ENCOUNTER — Inpatient Hospital Stay: Payer: Medicare Other

## 2016-07-13 LAB — TROPONIN I
TROPONIN I: 0.03 ng/mL — AB (ref <0.03)
Troponin I: 0.03 ng/mL (ref <0.03)
Troponin I: 0.03 ng/mL (ref <0.03)

## 2016-07-13 LAB — BASIC METABOLIC PANEL
ANION GAP: 4 — AB (ref 5–15)
BUN: 17 mg/dL (ref 6–20)
CO2: 30 mmol/L (ref 22–32)
Calcium: 8.6 mg/dL — ABNORMAL LOW (ref 8.9–10.3)
Chloride: 110 mmol/L (ref 101–111)
Creatinine, Ser: 1.13 mg/dL — ABNORMAL HIGH (ref 0.44–1.00)
GFR calc non Af Amer: 43 mL/min — ABNORMAL LOW (ref >60)
GFR, EST AFRICAN AMERICAN: 50 mL/min — AB (ref >60)
Glucose, Bld: 71 mg/dL (ref 65–99)
POTASSIUM: 2.9 mmol/L — AB (ref 3.5–5.1)
Sodium: 144 mmol/L (ref 135–145)

## 2016-07-13 LAB — CBC
HEMATOCRIT: 36.1 % (ref 35.0–47.0)
HEMOGLOBIN: 12 g/dL (ref 12.0–16.0)
MCH: 27.7 pg (ref 26.0–34.0)
MCHC: 33.3 g/dL (ref 32.0–36.0)
MCV: 83.3 fL (ref 80.0–100.0)
Platelets: 230 10*3/uL (ref 150–440)
RBC: 4.33 MIL/uL (ref 3.80–5.20)
RDW: 14.6 % — ABNORMAL HIGH (ref 11.5–14.5)
WBC: 4.8 10*3/uL (ref 3.6–11.0)

## 2016-07-13 LAB — GLUCOSE, CAPILLARY
GLUCOSE-CAPILLARY: 71 mg/dL (ref 65–99)
GLUCOSE-CAPILLARY: 57 mg/dL — AB (ref 65–99)

## 2016-07-13 MED ORDER — INSULIN DETEMIR 100 UNIT/ML ~~LOC~~ SOLN
6.0000 [IU] | Freq: Every morning | SUBCUTANEOUS | Status: DC
  Administered 2016-07-14: 6 [IU] via SUBCUTANEOUS
  Filled 2016-07-13 (×2): qty 0.06

## 2016-07-13 MED ORDER — PANTOPRAZOLE SODIUM 40 MG IV SOLR
40.0000 mg | Freq: Two times a day (BID) | INTRAVENOUS | Status: DC
  Administered 2016-07-13 – 2016-07-18 (×11): 40 mg via INTRAVENOUS
  Filled 2016-07-13 (×11): qty 40

## 2016-07-13 MED ORDER — OXYCODONE-ACETAMINOPHEN 5-325 MG PO TABS: 1.0000 | ORAL_TABLET | Freq: Four times a day (QID) | ORAL | Status: DC | PRN

## 2016-07-13 MED ORDER — APIXABAN 5 MG PO TABS
5.0000 mg | ORAL_TABLET | Freq: Two times a day (BID) | ORAL | Status: DC
  Administered 2016-07-13 – 2016-07-18 (×10): 5 mg via ORAL
  Filled 2016-07-13 (×11): qty 1

## 2016-07-13 NOTE — Plan of Care (Signed)
Problem: Nutrition: Goal: Adequate nutrition will be maintained Outcome: Not Progressing Patient blood sugar level has dropped due to poor oral intake, encourage snacks. Dietary consult

## 2016-07-13 NOTE — Progress Notes (Signed)
Patient complained of nausea while attempting to take meds; PRN Zofran IV given.

## 2016-07-13 NOTE — Care Management (Signed)
patient has had 8 admissions to [facility] with in  6 months.  She was discharged from  [facility] to home and reaching out to facility to determine if patient had any home health set up at that time.  She has been followed by palliative at facility in March but appears to have been lost to palliative follow up after March.  Requested palliative care to address full code status and frequent admissions.  Informed by  [facility] that patient did not have any services set up at discharge.  Have reached out to patient's daughter [name] to discuss discharge disposition.  left voicemail on home phone and cell.  Physical therapy has assessed and is recommending skilled nursing.

## 2016-07-13 NOTE — Consult Note (Signed)
[name]  CARDIOLOGY CONSULT NOTE  Patient ID: Sherry [name] MRN: [id number] DOB/AGE: 15-May-1927 83 y.o.  Admit date: 07/12/2016 Referring Physician Dr. Verdell Carmine Primary Physician [facility] Primary Cardiologist none Reason for Consultation Chest pain  HPI: Patient is an 83 year old female with history of diabetes, hyperlipidemia, hypertension, pulmonary embolism treated with Eliquis. She was admitted with chest pain. Patient is a very difficult historian. She states she has had constant chest pain "for as long as she can remember". She is ruled out for myocardial infarction. She also had reported nausea and vomiting for 3 days. Chest x-ray was unremarkable. Brain CT was negative for any acute findings. There is chronic small vessel disease. Abdominal CT revealed moderate hiatal hernia and distal esophageal wall thickening, aortic atherosclerosis. EKG revealed sinus rhythm with interventricular conduction delay with PACs. Cardiac markers are normal. Echocardiogram is pending. Echocardiogram done in March 2018 revealed an ejection fraction of 40-45% with no significant valvular disease. She is currently hemodynamically stable.  Review of Systems  Constitutional: Positive for malaise/fatigue.  HENT: Negative.   Eyes: Negative.   Respiratory: Positive for shortness of breath.   Cardiovascular: Positive for chest pain.  Gastrointestinal: Positive for abdominal pain, nausea and vomiting.  Genitourinary: Negative.   Musculoskeletal: Negative.   Skin: Negative.   Neurological: Positive for weakness.  Endo/Heme/Allergies: Negative.   Psychiatric/Behavioral: Positive for memory loss.    Past Medical History:  Diagnosis Date  . Diabetes mellitus without complication ([place])   . Hyperlipidemia   . Hypertension   . Pulmonary embolism ([facility])     Family History  Problem Relation Age of Onset  . Diabetes Mother   . Cancer Mother   . Cancer  Father   . Bladder Cancer Neg Hx   . Kidney cancer Neg Hx     Social History   Social History  . Marital status: Single    Spouse name: N/A  . Number of children: N/A  . Years of education: N/A   Occupational History  . Not on file.   Social History Main Topics  . Smoking status: Never Smoker  . Smokeless tobacco: Never Used  . Alcohol use No  . Drug use: No  . Sexual activity: No   Other Topics Concern  . Not on file   Social History Narrative  . No narrative on file    Past Surgical History:  Procedure Laterality Date  . APPENDECTOMY    . BACK SURGERY    . ESOPHAGOGASTRODUODENOSCOPY N/A 05/29/2016   Procedure: ESOPHAGOGASTRODUODENOSCOPY (EGD);  Surgeon: Clarene Essex, MD;  Location: [place];  Service: Gastroenterology;  Laterality: N/A;  . SHOULDER SURGERY       Prescriptions Prior to Admission  Medication Sig Dispense Refill Last Dose  . amLODipine (NORVASC) 5 MG tablet Take 5 mg by mouth daily.   Unknown at Unknown  . apixaban (ELIQUIS) 5 MG TABS tablet Take 1 tablet (5 mg total) by mouth 2 (two) times daily. (Patient taking differently: Take 2.5 mg by mouth 2 (two) times daily. ) 60 tablet 3 Past Week at Unknown time  . atorvastatin (LIPITOR) 20 MG tablet Take 20 mg by mouth at bedtime.    Unknown at Unknown  . cefdinir (OMNICEF) 300 MG capsule Take 300 mg by mouth 2 (two) times daily.   Past Week at Unknown time  . diphenhydrAMINE (BENADRYL) 25 MG tablet Take 25 mg by mouth daily as needed for itching or allergies.  prn at prn  . ferrous sulfate 325 (65 FE) MG tablet Take 1 tablet (325 mg total) by mouth daily with breakfast. 60 tablet 3 Past Week at Unknown time  . gabapentin (NEURONTIN) 300 MG capsule Take 300 mg by mouth at bedtime.    Past Week at Unknown time  . insulin degludec (TRESIBA FLEXTOUCH) 100 UNIT/ML SOPN FlexTouch Pen Inject 10 Units into the skin every morning.   Past Week at Unknown time  . Insulin Detemir (LEVEMIR) 100 UNIT/ML Pen Inject 8  Units into the skin every morning. 15 mL 0 Unknown at Unknown  . lisinopril-hydrochlorothiazide (PRINZIDE,ZESTORETIC) 20-12.5 MG tablet Take 2 tablets by mouth daily.    Unknown at Unknown  . magnesium oxide (MAG-OX) 400 (241.3 Mg) MG tablet Take 1 tablet (400 mg total) by mouth daily. 30 tablet 0 Unknown at Unknown  . meloxicam (MOBIC) 7.5 MG tablet Take 7.5 mg by mouth daily.   Unknown at Unknown  . metoCLOPramide (REGLAN) 5 MG tablet Take 1 tablet (5 mg total) by mouth 2 (two) times daily. 60 tablet 2 Past Week at Unknown time  . Multiple Vitamin (MULTIVITAMIN WITH MINERALS) TABS tablet Take 1 tablet by mouth daily. 30 tablet 2 Unknown at Unknown  . ondansetron (ZOFRAN-ODT) 4 MG disintegrating tablet Take 4 mg by mouth every 6 (six) hours as needed for nausea or vomiting.   PRN at PRN  . oxyCODONE-acetaminophen (PERCOCET) 7.5-325 MG tablet Take 1 tablet by mouth 2 (two) times daily as needed for severe pain.  0 Unknown at Unknown  . pantoprazole (PROTONIX) 40 MG tablet Take 1 tablet (40 mg total) by mouth 2 (two) times daily. (Patient taking differently: Take 40 mg by mouth daily. ) 60 tablet 0 Past Week at Unknown time  . polyethylene glycol (MIRALAX) packet Take 17 g by mouth daily as needed. 30 each 0 PRN at PRN  . sertraline (ZOLOFT) 25 MG tablet Take 1 tablet (25 mg total) by mouth daily. 30 tablet 2 Past Week at Unknown time  . sucralfate (CARAFATE) 1 GM/10ML suspension Take 10 mLs (1 g total) by mouth 4 (four) times daily -  with meals and at bedtime. 420 mL 0 Past Week at Unknown time  . tamsulosin (FLOMAX) 0.4 MG CAPS capsule Take 1 capsule (0.4 mg total) by mouth daily. (Patient taking differently: Take 0.4 mg by mouth daily after lunch. ) 30 capsule 0 Past Week at Unknown time  . Amino Acids-Protein Hydrolys (FEEDING SUPPLEMENT, PRO-STAT SUGAR FREE 64,) LIQD Take 30 mLs by mouth 3 (three) times daily. (Patient not taking: Reported on 07/05/2016) 900 mL 2 Not Taking at Unknown time  .  potassium chloride (K-DUR,KLOR-CON) 20 MEQ tablet Take 1 tablet (20 mEq total) by mouth daily. (Patient not taking: Reported on 07/13/2016) 30 tablet 2 Not Taking at Unknown time    Physical Exam: Blood pressure 139/62, pulse 88, temperature 97.6 F (36.4 C), temperature source Oral, resp. rate 20, height 5\' 4"  (1.626 m), weight 54.9 kg (121 lb), SpO2 98 %.   Wt Readings from Last 1 Encounters:  07/12/16 54.9 kg (121 lb)     General appearance: cooperative and slowed mentation Resp: clear to auscultation bilaterally Cardio: regular rate and rhythm GI: normal findings: bowel sounds normal, no masses palpable and no organomegaly and abnormal findings:  mild tenderness in the epigastrium Extremities: extremities normal, atraumatic, no cyanosis or edema Neurologic: Grossly normal  Labs:   Lab Results  Component Value Date   WBC 4.8 07/13/2016  HGB 12.0 07/13/2016   HCT 36.1 07/13/2016   MCV 83.3 07/13/2016   PLT 230 07/13/2016    Recent Labs Lab 07/13/16 0740  NA 144  K 2.9*  CL 110  CO2 30  BUN 17  CREATININE 1.13*  CALCIUM 8.6*  GLUCOSE 71   Lab Results  Component Value Date   CKTOTAL 14 (L) 07/12/2016   CKMB 1.2 04/21/2012   TROPONINI 0.03 (HH) 07/13/2016      ASSESSMENT AND PLAN:  83 year old female with history of diabetes, DVT on Eliquis who is admitted with nausea, vomiting and chest discomfort. She was noted to have a hiatal hernia. She is ruled out for myocardial infarction. Electrocardiogram is unrevealing. Pain does not appear to be significant for ischemia. She does have relative hypokalemia and this has been repleted. Admission potassium was 2.9. She also has a chronic renal insufficiency with current creatinine of 1.13. Echocardiogram is pending. If this is unremarkable for a significant change from one done in April 2018, with discharge cardiac standpoint on current regimen. She has ruled out for an mi.  Signed: Teodoro Spray MD, [facility] 07/13/2016, 1:31  PM

## 2016-07-13 NOTE — Progress Notes (Signed)
[facility] [place] NAME: Sherry [name]    MR#:  [id number]  DATE OF BIRTH:  [birth year]  SUBJECTIVE:   Patient here due to generalized weakness, nausea vomiting and also atypical chest pain. Patient denies any chest pain presently. She is quite nauseated and not eating much. No other acute events overnight.  REVIEW OF SYSTEMS:    Review of Systems  Constitutional: Negative for chills and fever.  HENT: Negative for congestion and tinnitus.   Eyes: Negative for blurred vision and double vision.  Respiratory: Negative for cough, shortness of breath and wheezing.   Cardiovascular: Negative for chest pain, orthopnea and PND.  Gastrointestinal: Positive for nausea and vomiting. Negative for abdominal pain and diarrhea.  Genitourinary: Negative for dysuria and hematuria.  Neurological: Positive for weakness. Negative for dizziness, sensory change and focal weakness.  All other systems reviewed and are negative.   Nutrition: Heart Healthy/Carb control Tolerating Diet: Yes Tolerating PT: Await Eval.   DRUG ALLERGIES:  No Known Allergies  VITALS:  Blood pressure 139/62, pulse 88, temperature 97.6 F (36.4 C), temperature source Oral, resp. rate 20, height 5\' 4"  (1.626 m), weight 54.9 kg (121 lb), SpO2 98 %.  PHYSICAL EXAMINATION:   Physical Exam  GENERAL:  83 y.o.-year-old cachectic patient lying in bed in mild GI Distress.   EYES: Pupils equal, round, reactive to light and accommodation. No scleral icterus. Extraocular muscles intact.  HEENT: Head atraumatic, normocephalic. Oropharynx and nasopharynx clear. Dry Oral Mucosa. NECK:  Supple, no jugular venous distention. No thyroid enlargement, no tenderness.  LUNGS: Normal breath sounds bilaterally, no wheezing, rales, rhonchi. No use of accessory muscles of respiration.  CARDIOVASCULAR: S1, S2 normal. No murmurs, rubs, or gallops.  ABDOMEN: Soft, nontender, nondistended. Bowel sounds present.  No organomegaly or mass.  EXTREMITIES: No cyanosis, clubbing or edema b/l.    NEUROLOGIC: Cranial nerves II through XII are intact. No focal Motor or sensory deficits b/l. Globally weak. PSYCHIATRIC: The patient is alert and oriented x 3.  SKIN: No obvious rash, lesion, or ulcer.    LABORATORY PANEL:   CBC  Recent Labs Lab 07/13/16 0740  WBC 4.8  HGB 12.0  HCT 36.1  PLT 230   ------------------------------------------------------------------------------------------------------------------  Chemistries   Recent Labs Lab 07/12/16 1916 07/13/16 0740  NA 142 144  K 3.3* 2.9*  CL 105 110  CO2 25 30  GLUCOSE 159* 71  BUN 18 17  CREATININE 1.34* 1.13*  CALCIUM 9.0 8.6*  MG 1.8  --    ------------------------------------------------------------------------------------------------------------------  Cardiac Enzymes  Recent Labs Lab 07/13/16 0740  TROPONINI 0.03*   ------------------------------------------------------------------------------------------------------------------  RADIOLOGY:  Ct Abdomen Pelvis Wo Contrast  Result Date: 07/13/2016 CLINICAL DATA:  Abdominal pain.  No p.o. intake for 3 days. EXAM: CT ABDOMEN AND PELVIS WITHOUT CONTRAST TECHNIQUE: Multidetector CT imaging of the abdomen and pelvis was performed following the standard protocol without IV contrast. COMPARISON:  Renal protocol CT 06/22/2016. FINDINGS: Lower chest: The lung bases are clear. Again seen hiatal hernia with thickening of distal esophagus. Hepatobiliary: Punctate hepatic granuloma. Low-density lesion in the liver on prior contrast-enhanced exam is not well visualized without contrast. Gallbladder physiologically distended, no calcified stone. No biliary dilatation. Pancreas: Parenchymal atrophy. No ductal dilatation or inflammation. Spleen: Normal in size without focal abnormality. Adrenals/Urinary Tract: Normal adrenal glands. Abnormal density in the right renal pelvis which was previously  assessed on renal protocol CT. There is prominence of the a upper pole calices of the  right kidney. Filling defects on prior exam not well visualized without contrast. Mild perinephric edema about both kidneys is unchanged from prior. Urinary bladder is physiologically distended. Air in the urinary bladder likely related to prior Foley catheter. No definite bladder wall thickening. Stomach/Bowel: Moderate hiatal hernia with thickening of the distal esophagus. No small bowel dilatation or inflammation. Colonic tortuosity without inflammatory change. Duodenum diverticulum is again seen without inflammation. Appendix is not visualized. Vascular/Lymphatic: Aortic and branch atherosclerosis. No aneurysm. No adenopathy. Reproductive: Atrophic uterus, normal for age.  No adnexal mass. Other: No free air, free fluid, or intra-abdominal fluid collection. Musculoskeletal: Chronic L3 compression fracture is stable from prior exam. Again seen degenerative change in the spine. IMPRESSION: 1. Again seen moderate hiatal hernia and distal esophageal wall thickening. 2. Abnormal density in the right renal pelvis, better characterized on previous contrast-enhanced exams, concerning for transitional cell carcinoma. 3. No new abnormality is seen. 4. Aortic atherosclerosis. Electronically Signed   By: Jeb Levering M.D.   On: 07/13/2016 01:39   Dg Chest 2 View  Result Date: 07/12/2016 CLINICAL DATA:  Chest pain. Weakness, nausea and vomiting. No p.o. intake for 3 days. EXAM: CHEST  2 VIEW COMPARISON:  Most recent comparison radiograph 06/30/2016. Chest CT 03/09/2016 FINDINGS: The cardiomediastinal contours are normal. Again seen atherosclerosis of thoracic aorta. Hiatal hernia. Minimal basilar atelectasis. Pulmonary vasculature is normal. No consolidation, pleural effusion, or pneumothorax. No acute osseous abnormalities are seen. Chronic widening of right acromioclavicular joint. Bones are under mineralized. IMPRESSION: No acute  abnormality. Electronically Signed   By: Jeb Levering M.D.   On: 07/12/2016 19:44   Ct Head Wo Contrast  Result Date: 07/12/2016 CLINICAL DATA:  Generalized weakness with nausea and vomiting. EXAM: CT HEAD WITHOUT CONTRAST TECHNIQUE: Contiguous axial images were obtained from the base of the skull through the vertex without intravenous contrast. COMPARISON:  Brain MRI 04/19/2016.  Head CT 04/18/2016 FINDINGS: Brain: No evidence of acute infarction, hemorrhage, hydrocephalus, extra-axial collection or mass lesion/mass effect. Mild to moderate for age atrophy, most prominent in the frontal lobes. 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 Extensive chronic microvascular ischemic gliosis in the cerebral white matter. Vascular: Atherosclerotic calcification.  No hyperdense vessel. Skull: No acute finding Sinuses/Orbits: No acute finding.  Bilateral cataract resection. IMPRESSION: 1. No acute finding or change from prior. 2. Chronic small vessel ischemic injury. Electronically Signed   By: Monte Fantasia M.D.   On: 07/12/2016 20:21     ASSESSMENT AND PLAN:   82 year old female with past medical history of diabetes, hypertension, hyperlipidemia, history of pulmonary embolism, recent admission at [facility] for nausea vomiting and abdominal pain who presents to the hospital due to atypical chest pain, nausea vomiting.  1. Nausea vomiting-etiology unclear but suspected to be secondary to esophagitis. Patient was recently hospitalized at [facility] in May underwent an upper GI endoscopy which showed grade D esophagitis with a moderate size hiatal hernia. She was placed on PPI and Carafate. -Continue supportive care with IV fluids, antiemetics, IV Protonix, Carafate.  2. Atypical chest pain with elevated troponin-patient denies any chest pain presently. Cardiac markers 3 have been negative. -Appreciate cardiology consult and they recommended getting a repeat echocardiogram but she  had one in April which was essentially benign therefore will not repeat presently. -Continue aspirin, nitroglycerin, statin.  3. Hypokalemia-secondary to the nausea vomiting. We'll continue to supplement, repeat level in the morning. -Check magnesium level.  4. History of a renal mass-patient has a  renal mass which is being worked up by urology as an outpatient. She is scheduled for surgery by urology coming up later this month. Follow-up if any hematuria, we'll consult urology if needed.  5. History of pulmonary embolism-continue Eliquis.  6. Essential hypertension-continue Norvasc, hydrochlorothiazide  7. Diabetes type 2 without complication-continue Levemir, sliding scale insulin.  8. Hyperlipidemia-continue atorvastatin.  Will get Palliative care consult to discuss goals of care as pt. Has been hospitalized multiple times in the past 6 months. Discussed plan of care with patient's daughter over the phone.  All the records are reviewed and case discussed with Care Management/Social Worker. Management plans discussed with the patient, family and they are in agreement.  CODE STATUS: Full code  DVT Prophylaxis: Eliquis  TOTAL TIME TAKING CARE OF THIS PATIENT: 30 minutes.   POSSIBLE D/C IN 1-2 DAYS, DEPENDING ON CLINICAL CONDITION.   Henreitta Leber M.D on 07/13/2016 at 2:33 PM  Between 7am to 6pm - Pager - [phone]  After 6pm go to [email or web address] - [employer] [facility]  Office  [phone]  CC: Primary care physician; Center, [facility]

## 2016-07-13 NOTE — Progress Notes (Signed)
Palliative NP to meet with patient and daughter tomorrow 07/14/16 @2pm .  NO CHARGE  Ihor Dow, FNP-C Palliative Medicine Team  Phone: [phone] Fax: [phone]

## 2016-07-13 NOTE — Progress Notes (Signed)
  Hypoglycemic Event  CBG: 57  Treatment: 4oz of orange juice and  4ozsoda  Symptoms: tremors  Follow-up CBG: Time:2043 CBG Result:86  Possible Reasons for Event: Poor oral intake  Comments/MD notified:n/a    Joanne Gavel  \[phone]\

## 2016-07-13 NOTE — Evaluation (Signed)
Physical Therapy Evaluation Patient Details Name: Sherry [name] MRN: [id number] DOB: 07-14-1927 Today's Date: 07/13/2016   History of Present Illness  presented to ER secondary to weakness, chest and abdominal pain since discharge from recent rehab stay; admitted with acute kidney injury related to dehydration and gastritis.  Cardiology consult pending due to reported chest pain; however, troponins flat, patient cleared for particiaption with session by attending physician Verdell Carmine)  Clinical Impression  Patient with very flat affect, generally disengaged with therapist throughout session.  Responds to all questions/conversation, though requires increased time and displays limited self-initiation.  Bilat UE/LE generally weak and deconditioned.  Requiring min assist with RW for all functional mobiltiy at this time; 1-2 posterior LOB during gait trial with min assist from therapist to recover.  Unable to tolerate additional activity/mobility due to fatigue. Patient fatigues quickly; HR elevation to 130s with mobility (no reports of chest pain), but recovers to baseline (low 100s) with 1-2 min seated rest period. Would benefit from skilled PT to address above deficits and promote optimal return to PLOF; recommend transition to STR upon discharge from acute hospitalization.     Follow Up Recommendations SNF    Equipment Recommendations  Rolling walker with 5" wheels    Recommendations for Other Services       Precautions / Restrictions Precautions Precautions: Fall Restrictions Weight Bearing Restrictions: No      Mobility  Bed Mobility Overal bed mobility: Needs Assistance Bed Mobility: Supine to Sit     Supine to sit: Supervision        Transfers Overall transfer level: Needs assistance Equipment used: Rolling walker (2 wheeled) Transfers: Sit to/from Stand Sit to Stand: Min assist         General transfer comment: tends to pull up on RW despite cuing from  therapist  Ambulation/Gait Ambulation/Gait assistance: Min assist Ambulation Distance (Feet): 50 Feet Assistive device: Rolling walker (2 wheeled)     Gait velocity interpretation: Below normal speed for age/gender General Gait Details: broad BOS with staggering steps, poor dynamic balance.  Broad turning radius with poor cadence and overall gait speed.  Posterior LOB x1-2 during gait trial, requiring min assist +1 from therapist for correction.  Stairs            Wheelchair Mobility    Modified Rankin (Stroke Patients Only)       Balance Overall balance assessment: Needs assistance Sitting-balance support: No upper extremity supported;Feet supported Sitting balance-Leahy Scale: Fair     Standing balance support: Bilateral upper extremity supported Standing balance-Leahy Scale: Poor                               Pertinent Vitals/Pain Pain Assessment: No/denies pain    Home Living Family/patient expects to be discharged to:: Private residence Living Arrangements: Children (son) Available Help at Discharge: Available 24 hours/day Type of Home: Apartment Home Access: Level entry     Home Layout: One level Home Equipment: Walker - 2 wheels;Hospital bed;Shower seat;Wheelchair - manual;Bedside commode      Prior Function Level of Independence: Independent with assistive device(s)         Comments: Patient reports indep with ADLs, but son assists with mobility, household chores/activities as needed.  Denies recent fall history.  Intermittent use of RW.     Hand Dominance        Extremity/Trunk Assessment   Upper Extremity Assessment Upper Extremity Assessment: Generalized weakness  Lower Extremity Assessment Lower Extremity Assessment: Generalized weakness (grossly 3+/5 throughout)       Communication   Communication: HOH  Cognition Arousal/Alertness: Lethargic Behavior During Therapy: WFL for tasks assessed/performed Overall  Cognitive Status:  (lethargic, frequently closing eyes during session; arouses easily with verbal cuing)                                 General Comments: generally disengaged, disinterested in mobility efforts throughout session      General Comments      Exercises     Assessment/Plan    PT Assessment Patient needs continued PT services  PT Problem List Decreased strength;Decreased range of motion;Decreased activity tolerance;Decreased balance;Decreased mobility;Decreased coordination;Decreased cognition;Decreased knowledge of use of DME;Decreased safety awareness;Decreased knowledge of precautions       PT Treatment Interventions DME instruction;Gait training;Functional mobility training;Therapeutic activities;Therapeutic exercise;Balance training;Patient/family education    PT Goals (Current goals can be found in the Care Plan section)  Acute Rehab PT Goals PT Goal Formulation: Patient unable to participate in goal setting Time For Goal Achievement: 07/27/16 Potential to Achieve Goals: Fair    Frequency Min 2X/week   Barriers to discharge Decreased caregiver support      Co-evaluation               AM-PAC PT "6 Clicks" Daily Activity  Outcome Measure Difficulty turning over in bed (including adjusting bedclothes, sheets and blankets)?: A Little Difficulty moving from lying on back to sitting on the side of the bed? : A Little Difficulty sitting down on and standing up from a chair with arms (e.g., wheelchair, bedside commode, etc,.)?: Total Help needed moving to and from a bed to chair (including a wheelchair)?: A Little Help needed walking in hospital room?: A Little Help needed climbing 3-5 steps with a railing? : A Lot 6 Click Score: 15    End of Session Equipment Utilized During Treatment: Gait belt Activity Tolerance: Patient limited by lethargy Patient left: in chair;with call bell/phone within reach;with chair alarm set (alarm pad  connected to box; CNA to locate/place cord to call bell system as available) Nurse Communication: Mobility status PT Visit Diagnosis: Difficulty in walking, not elsewhere classified (R26.2);Muscle weakness (generalized) (M62.81)    Time: 2119-4174 PT Time Calculation (min) (ACUTE ONLY): 27 min   Charges:   PT Evaluation $PT Eval Low Complexity: 1 Procedure PT Treatments $Therapeutic Activity: 8-22 mins   PT G Codes:        Jenai Scaletta H. Owens Shark, PT, DPT, NCS 07/13/16, 12:35 PM [phone]

## 2016-07-14 ENCOUNTER — Inpatient Hospital Stay: Admission: RE | Admit: 2016-07-14 | Payer: Medicare Other | Source: Ambulatory Visit

## 2016-07-14 DIAGNOSIS — R112 Nausea with vomiting, unspecified: Secondary | ICD-10-CM

## 2016-07-14 DIAGNOSIS — Z515 Encounter for palliative care: Secondary | ICD-10-CM

## 2016-07-14 DIAGNOSIS — N2889 Other specified disorders of kidney and ureter: Secondary | ICD-10-CM

## 2016-07-14 DIAGNOSIS — E860 Dehydration: Secondary | ICD-10-CM

## 2016-07-14 DIAGNOSIS — Z7189 Other specified counseling: Secondary | ICD-10-CM

## 2016-07-14 LAB — GLUCOSE, CAPILLARY
GLUCOSE-CAPILLARY: 91 mg/dL (ref 65–99)
GLUCOSE-CAPILLARY: 121 mg/dL — AB (ref 65–99)
GLUCOSE-CAPILLARY: 86 mg/dL (ref 65–99)
GLUCOSE-CAPILLARY: 125 mg/dL — AB (ref 65–99)
GLUCOSE-CAPILLARY: 100 mg/dL — AB (ref 65–99)
Glucose-Capillary: 67 mg/dL (ref 65–99)
Glucose-Capillary: 64 mg/dL — ABNORMAL LOW (ref 65–99)
Glucose-Capillary: 79 mg/dL (ref 65–99)
Glucose-Capillary: 92 mg/dL (ref 65–99)

## 2016-07-14 LAB — HEMOGLOBIN A1C
Hgb A1c MFr Bld: 7.2 % — ABNORMAL HIGH (ref 4.8–5.6)
Mean Plasma Glucose: 160 mg/dL

## 2016-07-14 LAB — POTASSIUM: POTASSIUM: 3.2 mmol/L — AB (ref 3.5–5.1)

## 2016-07-14 LAB — MAGNESIUM: Magnesium: 1.6 mg/dL — ABNORMAL LOW (ref 1.7–2.4)

## 2016-07-14 MED ORDER — PROMETHAZINE HCL 25 MG/ML IJ SOLN
12.5000 mg | Freq: Four times a day (QID) | INTRAMUSCULAR | Status: DC | PRN
  Administered 2016-07-14: 12.5 mg via INTRAVENOUS
  Filled 2016-07-14: qty 1

## 2016-07-14 MED ORDER — MAGNESIUM SULFATE 4 GM/100ML IV SOLN
4.0000 g | Freq: Once | INTRAVENOUS | Status: AC
  Administered 2016-07-14: 4 g via INTRAVENOUS
  Filled 2016-07-14: qty 100

## 2016-07-14 MED ORDER — POTASSIUM CHLORIDE CRYS ER 20 MEQ PO TBCR
20.0000 meq | EXTENDED_RELEASE_TABLET | Freq: Two times a day (BID) | ORAL | Status: DC
  Administered 2016-07-14 – 2016-07-15 (×2): 20 meq via ORAL
  Filled 2016-07-14 (×2): qty 1

## 2016-07-14 NOTE — Progress Notes (Signed)
[place] [facility] NAME: Sherry [name]    MR#:  [id number]  DATE OF BIRTH:  05-21-1927  SUBJECTIVE:   Patient here due to generalized weakness, nausea, vomiting and also atypical chest pain. Still continues to have persistent nausea vomiting. By mouth intake is quite poor. Palliative care to have meeting with daughter about goals of care today.  REVIEW OF SYSTEMS:    Review of Systems  Constitutional: Negative for chills and fever.  HENT: Negative for congestion and tinnitus.   Eyes: Negative for blurred vision and double vision.  Respiratory: Negative for cough, shortness of breath and wheezing.   Cardiovascular: Negative for chest pain, orthopnea and PND.  Gastrointestinal: Positive for nausea and vomiting. Negative for abdominal pain and diarrhea.  Genitourinary: Negative for dysuria and hematuria.  Neurological: Positive for weakness. Negative for dizziness, sensory change and focal weakness.  All other systems reviewed and are negative.   Nutrition: Heart Healthy/Carb control Tolerating Diet: Yes Tolerating PT: Await Eval.   DRUG ALLERGIES:  No Known Allergies  VITALS:  Blood pressure (!) 128/41, pulse 96, temperature 97.6 F (36.4 C), temperature source Oral, resp. rate 18, height 5\' 4"  (1.626 m), weight 54.9 kg (121 lb), SpO2 95 %.  PHYSICAL EXAMINATION:   Physical Exam  GENERAL:  83 y.o.-year-old cachectic patient lying in bed in mild GI Distress.   EYES: Pupils equal, round, reactive to light and accommodation. No scleral icterus. Extraocular muscles intact.  HEENT: Head atraumatic, normocephalic. Oropharynx and nasopharynx clear. Dry Oral Mucosa. NECK:  Supple, no jugular venous distention. No thyroid enlargement, no tenderness.  LUNGS: Normal breath sounds bilaterally, no wheezing, rales, rhonchi. No use of accessory muscles of respiration.  CARDIOVASCULAR: S1, S2 normal. No murmurs, rubs, or gallops.  ABDOMEN: Soft,  nontender, nondistended. Bowel sounds present. No organomegaly or mass.  EXTREMITIES: No cyanosis, clubbing or edema b/l.    NEUROLOGIC: Cranial nerves II through XII are intact. No focal Motor or sensory deficits b/l. Globally weak. PSYCHIATRIC: The patient is alert and oriented x 3.  SKIN: No obvious rash, lesion, or ulcer.    LABORATORY PANEL:   CBC  Recent Labs Lab 07/13/16 0740  WBC 4.8  HGB 12.0  HCT 36.1  PLT 230   ------------------------------------------------------------------------------------------------------------------  Chemistries   Recent Labs Lab 07/13/16 0740 07/14/16 0416  NA 144  --   K 2.9* 3.2*  CL 110  --   CO2 30  --   GLUCOSE 71  --   BUN 17  --   CREATININE 1.13*  --   CALCIUM 8.6*  --   MG  --  1.6*   ------------------------------------------------------------------------------------------------------------------  Cardiac Enzymes  Recent Labs Lab 07/13/16 1502  TROPONINI 0.03*   ------------------------------------------------------------------------------------------------------------------  RADIOLOGY:  Ct Abdomen Pelvis Wo Contrast  Result Date: 07/13/2016 CLINICAL DATA:  Abdominal pain.  No p.o. intake for 3 days. EXAM: CT ABDOMEN AND PELVIS WITHOUT CONTRAST TECHNIQUE: Multidetector CT imaging of the abdomen and pelvis was performed following the standard protocol without IV contrast. COMPARISON:  Renal protocol CT 06/22/2016. FINDINGS: Lower chest: The lung bases are clear. Again seen hiatal hernia with thickening of distal esophagus. Hepatobiliary: Punctate hepatic granuloma. Low-density lesion in the liver on prior contrast-enhanced exam is not well visualized without contrast. Gallbladder physiologically distended, no calcified stone. No biliary dilatation. Pancreas: Parenchymal atrophy. No ductal dilatation or inflammation. Spleen: Normal in size without focal abnormality. Adrenals/Urinary Tract: Normal adrenal glands. Abnormal  density in the right  renal pelvis which was previously assessed on renal protocol CT. There is prominence of the a upper pole calices of the right kidney. Filling defects on prior exam not well visualized without contrast. Mild perinephric edema about both kidneys is unchanged from prior. Urinary bladder is physiologically distended. Air in the urinary bladder likely related to prior Foley catheter. No definite bladder wall thickening. Stomach/Bowel: Moderate hiatal hernia with thickening of the distal esophagus. No small bowel dilatation or inflammation. Colonic tortuosity without inflammatory change. Duodenum diverticulum is again seen without inflammation. Appendix is not visualized. Vascular/Lymphatic: Aortic and branch atherosclerosis. No aneurysm. No adenopathy. Reproductive: Atrophic uterus, normal for age.  No adnexal mass. Other: No free air, free fluid, or intra-abdominal fluid collection. Musculoskeletal: Chronic L3 compression fracture is stable from prior exam. Again seen degenerative change in the spine. IMPRESSION: 1. Again seen moderate hiatal hernia and distal esophageal wall thickening. 2. Abnormal density in the right renal pelvis, better characterized on previous contrast-enhanced exams, concerning for transitional cell carcinoma. 3. No new abnormality is seen. 4. Aortic atherosclerosis. Electronically Signed   By: Jeb Levering M.D.   On: 07/13/2016 01:39   Dg Chest 2 View  Result Date: 07/12/2016 CLINICAL DATA:  Chest pain. Weakness, nausea and vomiting. No p.o. intake for 3 days. EXAM: CHEST  2 VIEW COMPARISON:  Most recent comparison radiograph 06/30/2016. Chest CT 03/09/2016 FINDINGS: The cardiomediastinal contours are normal. Again seen atherosclerosis of thoracic aorta. Hiatal hernia. Minimal basilar atelectasis. Pulmonary vasculature is normal. No consolidation, pleural effusion, or pneumothorax. No acute osseous abnormalities are seen. Chronic widening of right acromioclavicular  joint. Bones are under mineralized. IMPRESSION: No acute abnormality. Electronically Signed   By: Jeb Levering M.D.   On: 07/12/2016 19:44   Ct Head Wo Contrast  Result Date: 07/12/2016 CLINICAL DATA:  Generalized weakness with nausea and vomiting. EXAM: CT HEAD WITHOUT CONTRAST TECHNIQUE: Contiguous axial images were obtained from the base of the skull through the vertex without intravenous contrast. COMPARISON:  Brain MRI 04/19/2016.  Head CT 04/18/2016 FINDINGS: Brain: No evidence of acute infarction, hemorrhage, hydrocephalus, extra-axial collection or mass lesion/mass effect. Mild to moderate for age atrophy, most prominent in the frontal lobes. 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 Extensive chronic microvascular ischemic gliosis in the cerebral white matter. Vascular: Atherosclerotic calcification.  No hyperdense vessel. Skull: No acute finding Sinuses/Orbits: No acute finding.  Bilateral cataract resection. IMPRESSION: 1. No acute finding or change from prior. 2. Chronic small vessel ischemic injury. Electronically Signed   By: Monte Fantasia M.D.   On: 07/12/2016 20:21     ASSESSMENT AND PLAN:   83 year old female with past medical history of diabetes, hypertension, hyperlipidemia, history of pulmonary embolism, recent admission at [facility] for nausea vomiting and abdominal pain who presents to the hospital due to atypical chest pain, nausea vomiting.  1. Nausea vomiting-etiology unclear but suspected to be secondary to esophagitis. Patient was recently hospitalized at [facility] in May underwent an upper GI endoscopy which showed grade D esophagitis with a moderate size hiatal hernia. She was placed on PPI and Carafate. -Continues to have persistent nausea vomiting. I'll place the patient on scheduled Zofran, add some as needed Phenergan. Continue supportive care with IV fluids, PPI, Carafate.  2. Atypical chest pain with elevated troponin-patient denies any  chest pain presently. Cardiac markers 3 have been negative. -Appreciate cardiology consult and they recommended getting a repeat echocardiogram but she had one in April which was essentially benign  therefore will not repeat presently. -Continue aspirin, nitroglycerin, statin.  3. Hypokalemia-secondary to the nausea vomiting. Improved w/ supplementation and will monitor.  - Mg. Was low and will give IV mg. Sulfate.   4. History of a renal mass-patient has a renal mass which is being worked up by urology as an outpatient. She is scheduled for surgery by urology coming up later this month. Follow-up if any hematuria, we'll consult urology if needed.  5. History of pulmonary embolism-continue Eliquis.  6. Essential hypertension-continue Norvasc, hydrochlorothiazide  7. Diabetes type 2 without complication- was a bit hypoglycemic this a.m. Due to Poor PO intake.  - will taper Levemir, cont. SSI.   8. Hyperlipidemia-continue atorvastatin.  Palliative care to have meeting with Pt's daughter this afternoon about goals of care.   All the records are reviewed and case discussed with Care Management/Social Worker. Management plans discussed with the patient, family and they are in agreement.  CODE STATUS: Full code  DVT Prophylaxis: Eliquis  TOTAL TIME TAKING CARE OF THIS PATIENT: 25 minutes.   POSSIBLE D/C IN 2-3 DAYS, DEPENDING ON CLINICAL CONDITION.   Henreitta Leber M.D on 07/14/2016 at 1:29 PM  Between 7am to 6pm - Pager - [phone]  After 6pm go to [email or web address] - [employer] [facility]  Office  [phone]  CC: Primary care physician; [facility], [facility]

## 2016-07-14 NOTE — Consult Note (Signed)
Consultation Note Date: 07/14/2016   Patient Name: Sherry [name]  DOB: [birth year]  MRN: [id number]  Age / Sex: 83 y.o., female  PCP: [facility] Referring Physician: Henreitta Leber, MD  Reason for Consultation: Establishing goals of care  HPI/Patient Profile: 83 y.o. female  with past medical history of hypertension, hyperlipidemia, diabetes mellitus, and PE admitted on 07/12/2016 with weakness and chest pain. Recently discharged home from rehab facility. Multiple recent hospitalizations. Recent EGD revealed esophagitis and moderate hiatal hernia. Receiving PPI and Carafate. C/o chest pain. Troponins negative. Takes aspirin, nitro, and statin. Patient has been followed outpatient by urology for renal mass. Planned to have cystoscopy/biopsy on 7/20. Palliative medicine consultation for goals of care.  Clinical Assessment and Goals of Care: I have reviewed medical records, discussed with care team and met with patient at bedside to discuss diagnosis, GOC, EOL wishes, and options. Daughter, [name] was planning to be at bedside but per patient, she is not available this afternoon anymore due to work.   Introduced Palliative Medicine as specialized medical care for people living with serious illness. It focuses on providing relief from the symptoms and stress of a serious illness. The goal is to improve quality of life for both the patient and the family.  We discussed a brief life review of the patient. Lives with son, [name]. Four children all together-two have died. Speaks highly of [name], who is with her around the clock at home. Recently discharged from rehab and states "I was doing well with physical therapy." She returned home and able to ambulate with walker and perform ADL's with minimal assist. C/o 3-4 days of weakness with poor appetite due to nausea/vomiting. She speaks of her health declining the  last 6 months with recurrent hospitalizations.   Discussed hospital diagnoses and interventions. Patient states "they think I might have cancer." She is planning to see urology on 7/20 to evaluate renal mass.    Advanced directives, concepts specific to code status, and artifical feeding and hydration were discussed. Initially, patient tells me she would NOT want to be resuscitated or on life support if she naturally die but states "I have changed my mind" because "my children want me worked on." Strongly encouraged her to discuss EOL wishes with [name] and David--big decisions she is faced with in the future with her age, co-morbidities, frequent hospitalizations, and overall declining health.   She does tell me being at home is most important. She is hopeful to regain strength to ambulate with walker.   Questions and concerns were addressed. Patient shared stories about her family. Emotional support provided.     SUMMARY OF RECOMMENDATIONS    FULL code/FULL scope  Daughter did not show for our [place] meeting. VM left. Patient known to PMT--it has remained challenging for [place] to meet with this daughter.   Patient plans to pursue urology procedure this month regarding renal mass.   May benefit from outpatient palliative to follow on discharge to facilitate [place] conversations with patient and family.  PMT not at [facility] over the weekend but will f/u next week if still hospitalized.    Code Status/Advance Care Planning:  Full code  Symptom Management:   Per attending  Palliative Prophylaxis:   Aspiration, Delirium Protocol, Oral Care and Turn Reposition  Additional Recommendations (Limitations, Scope, Preferences):  Full Scope Treatment  Psycho-social/Spiritual:   Desire for further Chaplaincy support: no  Additional Recommendations: Caregiving  Support/Resources and Education on Hospice  Prognosis:   Unable to determine  Discharge Planning: To Be Determined      Primary  Diagnoses: Present on Admission: . Chest pain, rule out acute myocardial infarction   I have reviewed the medical record, interviewed the patient and family, and examined the patient. The following aspects are pertinent.  Past Medical History:  Diagnosis Date  . Diabetes mellitus without complication ([place])   . Hyperlipidemia   . Hypertension   . Pulmonary embolism [facility])    Social History   Social History  . Marital status: Single    Spouse name: N/A  . Number of children: N/A  . Years of education: N/A   Social History Main Topics  . Smoking status: Never Smoker  . Smokeless tobacco: Never Used  . Alcohol use No  . Drug use: No  . Sexual activity: No   Other Topics Concern  . None   Social History Narrative  . None   Family History  Problem Relation Age of Onset  . Diabetes Mother   . Cancer Mother   . Cancer Father   . Bladder Cancer Neg Hx   . Kidney cancer Neg Hx    Scheduled Meds: . amLODipine  5 mg Oral Daily  . apixaban  5 mg Oral BID  . aspirin EC  81 mg Oral Daily  . atorvastatin  20 mg Oral QHS  . ferrous sulfate  325 mg Oral Q breakfast  . gabapentin  300 mg Oral QHS  . hydrochlorothiazide  12.5 mg Oral Daily  . insulin aspart  0-5 Units Subcutaneous QHS  . insulin aspart  0-9 Units Subcutaneous TID WC  . insulin detemir  6 Units Subcutaneous q morning - 10a  . magnesium oxide  400 mg Oral Daily  . multivitamin with minerals  1 tablet Oral Daily  . pantoprazole (PROTONIX) IV  40 mg Intravenous Q12H  . potassium chloride  20 mEq Oral BID  . sertraline  25 mg Oral Daily  . sucralfate  1 g Oral TID WC & HS  . tamsulosin  0.4 mg Oral QPC lunch   Continuous Infusions: . sodium chloride 75 mL/hr at 07/14/16 1300   PRN Meds:.acetaminophen **OR** acetaminophen, albuterol, ipratropium, nitroGLYCERIN, ondansetron **OR** ondansetron (ZOFRAN) IV, oxyCODONE-acetaminophen, polyethylene glycol, promethazine Medications Prior to Admission:  Prior to  Admission medications   Medication Sig Start Date End Date Taking? Authorizing Provider  amLODipine (NORVASC) 5 MG tablet Take 5 mg by mouth daily.   Yes [provider]  apixaban (ELIQUIS) 5 MG TABS tablet Take 1 tablet (5 mg total) by mouth 2 (two) times daily. Patient taking differently: Take 2.5 mg by mouth 2 (two) times daily.  03/08/16  Yes Isaac Bliss, Rayford Halsted, MD  atorvastatin (LIPITOR) 20 MG tablet Take 20 mg by mouth at bedtime.    Yes [provider]  cefdinir (OMNICEF) 300 MG capsule Take 300 mg by mouth 2 (two) times daily. 07/05/16 07/20/16 Yes [provider]  diphenhydrAMINE (BENADRYL) 25 MG tablet Take 25 mg by mouth daily as needed  for itching or allergies.   Yes [provider]  ferrous sulfate 325 (65 FE) MG tablet Take 1 tablet (325 mg total) by mouth daily with breakfast. 06/03/16  Yes Lorella Nimrod, MD  gabapentin (NEURONTIN) 300 MG capsule Take 300 mg by mouth at bedtime.    Yes [provider]  insulin degludec (TRESIBA FLEXTOUCH) 100 UNIT/ML SOPN FlexTouch Pen Inject 10 Units into the skin every morning.   Yes [provider]  Insulin Detemir (LEVEMIR) 100 UNIT/ML Pen Inject 8 Units into the skin every morning. 06/02/16  Yes Lorella Nimrod, MD  lisinopril-hydrochlorothiazide (PRINZIDE,ZESTORETIC) 20-12.5 MG tablet Take 2 tablets by mouth daily.    Yes [provider]  magnesium oxide (MAG-OX) 400 (241.3 Mg) MG tablet Take 1 tablet (400 mg total) by mouth daily. 04/27/16  Yes Wieting, Richard, MD  meloxicam (MOBIC) 7.5 MG tablet Take 7.5 mg by mouth daily.   Yes [provider]  metoCLOPramide (REGLAN) 5 MG tablet Take 1 tablet (5 mg total) by mouth 2 (two) times daily. 06/02/16  Yes Lorella Nimrod, MD  Multiple Vitamin (MULTIVITAMIN WITH MINERALS) TABS tablet Take 1 tablet by mouth daily. 06/02/16  Yes Shela Leff, MD  ondansetron (ZOFRAN-ODT) 4 MG disintegrating tablet Take 4 mg by mouth every 6  (six) hours as needed for nausea or vomiting.   Yes [provider]  oxyCODONE-acetaminophen (PERCOCET) 7.5-325 MG tablet Take 1 tablet by mouth 2 (two) times daily as needed for severe pain. 07/11/16  Yes [provider]  pantoprazole (PROTONIX) 40 MG tablet Take 1 tablet (40 mg total) by mouth 2 (two) times daily. Patient taking differently: Take 40 mg by mouth daily.  04/27/16  Yes Wieting, Richard, MD  polyethylene glycol [facility]) packet Take 17 g by mouth daily as needed. 04/27/16  Yes Wieting, Richard, MD  sertraline (ZOLOFT) 25 MG tablet Take 1 tablet (25 mg total) by mouth daily. 06/02/16  Yes Lorella Nimrod, MD  sucralfate (CARAFATE) 1 GM/10ML suspension Take 10 mLs (1 g total) by mouth 4 (four) times daily -  with meals and at bedtime. 06/02/16  Yes Lorella Nimrod, MD  tamsulosin (FLOMAX) 0.4 MG CAPS capsule Take 1 capsule (0.4 mg total) by mouth daily. Patient taking differently: Take 0.4 mg by mouth daily after lunch.  03/14/16  Yes Fritzi Mandes, MD  Amino Acids-Protein Hydrolys (FEEDING SUPPLEMENT, PRO-STAT SUGAR FREE 64,) LIQD Take 30 mLs by mouth 3 (three) times daily. Patient not taking: Reported on 07/05/2016 06/02/16   Shela Leff, MD  potassium chloride (K-DUR,KLOR-CON) 20 MEQ tablet Take 1 tablet (20 mEq total) by mouth daily. Patient not taking: Reported on 07/13/2016 06/02/16   Lorella Nimrod, MD   No Known Allergies Review of Systems  Constitutional: Positive for activity change and appetite change.  Gastrointestinal: Positive for nausea and vomiting.  Neurological: Positive for weakness.   Physical Exam  Constitutional: She is oriented to person, place, and time. She is cooperative.  HENT:  Head: Normocephalic and atraumatic.  Cardiovascular: Regular rhythm.   Pulmonary/Chest: Effort normal and breath sounds normal. No accessory muscle usage. No tachypnea. No respiratory distress.  Abdominal: Normal appearance. There is no tenderness.  Neurological: She is  alert and oriented to person, place, and time.  Skin: Skin is warm and dry.  Psychiatric: She has a normal mood and affect. Her speech is normal and behavior is normal.  Nursing note and vitals reviewed.  Vital Signs: BP (!) 124/46 (BP Location: Left Arm)   Pulse 86  Temp 97.9 F (36.6 C) (Oral)   Resp 18   Ht _0  (1.626 m)   Wt 54.9 kg (121 lb)   SpO2 98%   BMI 20.77 kg/m  Pain Assessment: No/denies pain   Pain Score: 4   SpO2: SpO2: 98 % O2 Device:SpO2: 98 % O2 Flow Rate: .   IO: Intake/output summary:   Intake/Output Summary (Last 24 hours) at 07/14/16 1615 Last data filed at 07/14/16 1104  Gross per 24 hour  Intake             1565 ml  Output              350 ml  Net             1215 ml    LBM: Last BM Date: 07/12/16 Baseline Weight: Weight: 54 kg (119 lb) Most recent weight: Weight: 54.9 kg (121 lb)     Palliative Assessment/Data: PPS 40%   Flowsheet Rows     Most Recent Value  Intake Tab  Referral Department  Hospitalist  Unit at Time of Referral  Med/Surg Unit  Palliative Care Primary Diagnosis  Other (Comment)  Date Notified  07/13/16  Palliative Care Type  Return patient Palliative Care  Reason for referral  Clarify Goals of Care  Date of Admission  07/12/16  Date first seen by Palliative Care  07/13/16  # of days Palliative referral response time  1 Day(s)  # of days IP prior to Palliative referral  1  Clinical Assessment  Palliative Performance Scale Score  40%  Psychosocial & Spiritual Assessment  Palliative Care Outcomes  Patient/Family meeting held?  Yes  Who was at the meeting?  patient. (daughter no show)  Palliative Care Outcomes  Clarified goals of care, ACP counseling assistance, Provided psychosocial or spiritual support      Time In: 1420 Time Out: 1530 Time Total: 39mn Greater than 50%  of this time was spent counseling and coordinating care related to the above assessment and plan.  Signed by:  MIhor Dow  FNP-C Palliative Medicine Team  Phone: [phone]: [phone] Please contact Palliative Medicine Team phone at [phone] questions and concerns.  For individual provider: See AShea Evans

## 2016-07-14 NOTE — Care Management (Signed)
CM has left a third VM message for daughter lisa.  She was a no show for meeting with palliative care.  CM firm in message that needed to speak with her regarding discharge and stated firmly that needed return call ASAP.  Unable to proceed with disposition without speaking with family. spoke with another listed relative contact Leigh Aurora and he says [name] is suppose to be home in 5-10 minutes and will have her call CM.

## 2016-07-14 NOTE — Care Management (Signed)
have left a second voicemail message for daughter to discuss discharge disposition.  Per Palliaitve, she is suppose to be on the unit at 2pm today.  Patient says she has round the clock caregiver (son) and is never left alone.  Unable to confirm. Patient says she does not want to go back to skilled nursing which has been recommended by physical therapy

## 2016-07-14 NOTE — Progress Notes (Signed)
Inpatient Diabetes Program Recommendations  AACE/ADA: New Consensus Statement on Inpatient Glycemic Control (2015)  Target Ranges:  Prepandial:   less than 140 mg/dL      Peak postprandial:   less than 180 mg/dL (1-2 hours)      Critically ill patients:  140 - 180 mg/dL   Results for Sherry [name], Sherry [name] (MRN [id number]) as of 07/14/2016 10:31  Ref. Range 07/13/2016 07:43 07/13/2016 08:06 07/13/2016 12:05 07/13/2016 15:52 07/13/2016 22:06  Glucose-Capillary Latest Ref Range: 65 - 99 mg/dL 67 91 121 (H) 71 57 (L)   Results for Sherry [name], Sherry [name] (MRN [id number]) as of 07/14/2016 10:31  Ref. Range 07/14/2016 08:03  Glucose-Capillary Latest Ref Range: 65 - 99 mg/dL 64 (L)    Admit with: Weakness/ CP  History: DM  Home DM Meds: Levemir 8 units QAM  Current Insulin Orders: Levemir 6 units QAM      Novolog Sensitive Correction Scale/ SSI (0-9 units) TID AC + HS     Note patient with Hypoglycemia this AM after receiving 8 units Levemir yesterday AM.  Levemir reduced to 6 units QAM today.  Agree with reduction.     --Will follow patient during hospitalization--  Wyn Quaker RN, MSN, CDE Diabetes Coordinator Inpatient Glycemic Control Team Team Pager: [phone] (8a-5p)

## 2016-07-14 NOTE — Clinical Social Work Note (Signed)
CSW received referral for SNF.  Case discussed with case manager and plan is to discharge home with home health.  CSW to sign off please re-consult if social work needs arise.  Remedios Mckone R. Dajaun Goldring, MSW, LCSWA [phone]  

## 2016-07-14 NOTE — Care Management (Signed)
Received call back from patient's daughter- lisa.  She does not intend for patient to return to a skilled nursing facility.  Patient will not be left alone at home.  Provided [name] with brochure for med alert and medicaid pcs.  Home health referral to [facility]- choice- for SN PT OT Aide and SW.  Referral accepted.  patient has access to walker and bsc.

## 2016-07-14 NOTE — Care Management Important Message (Signed)
Important Message  Patient Details  Name: Sherry [name] MRN: [id number] Date of Birth: 02-22-1927   Medicare Important Message Given:  Yes  Signed IM notice given Katrina Stack, RN 07/14/2016, 7:51 AM

## 2016-07-14 NOTE — Progress Notes (Signed)
Physical Therapy Treatment Patient Details Name: Sherry [name] MRN: [id number] DOB: 11-Nov-1927 Today's Date: 07/14/2016    History of Present Illness presented to ER secondary to weakness, chest and abdominal pain since discharge from recent rehab stay; admitted with acute kidney injury related to dehydration and gastritis.  Cardiology consult pending due to reported chest pain; however, troponins flat, patient cleared for particiaption with session by attending physician Verdell Carmine)    PT Comments    Pt is a pleasant 83 y.o. F, admitted to acute care for chest and abdominal pain. Pt with mild bleeding to IV upon arrival; nursing notified. Pt performs bed mobility with supervision and tranfers and ambulation with minA due to generalized muscle weakness, intermittent c/o dizziness, and nausea. Pt amb total of 50 ft, with c/o dizziness 8/10 and following amb; pt with BP 103/44 at end of session and nursing notified on pt status. Pt presents with the following deficits: strength, endurance, and balance. Overall, pt responded well to today's treatment with no adverse affects, and is progressing well towards goals. Pt would benefit from continued skilled PT to address the previously mentioned impairments and promote return to PLOF. Currently recommending SNF, pending d/c.    Follow Up Recommendations  SNF     Equipment Recommendations  Rolling walker with 5" wheels    Recommendations for Other Services       Precautions / Restrictions Precautions Precautions: Fall Restrictions Weight Bearing Restrictions: No    Mobility  Bed Mobility Overal bed mobility: Needs Assistance Bed Mobility: Supine to Sit     Supine to sit: Supervision     General bed mobility comments: SUpervision due to intermittent c/o dizziness and nausea. Requires min cues for correct body mechanics, foot placement, and safety awareness.   Transfers Overall transfer level: Needs assistance Equipment used: Rolling  walker (2 wheeled) Transfers: Sit to/from Stand Sit to Stand: Min assist         General transfer comment: Requires min cues for hand placement and min physical assist to acheive STS due to generalized muscle weakness.   Ambulation/Gait Ambulation/Gait assistance: Min assist Ambulation Distance (Feet): 50 Feet Assistive device: Rolling walker (2 wheeled) Gait Pattern/deviations: Step-through pattern     General Gait Details: Lets RW get ahead, requires constant visual and tactile cues to maintain close proximity to RW and safety awareness. After 25 ft, pt with c/o dizziness. Once seated in chair, pt reported dizziness at 8/10 and nausea. Pt became lethargic and quiet. Stated it was because she was nauseous. Prior to leaving, pt dizziness down to 5/10, with cont nausea and BP at 103/42. Nursing notified on pt status.    Stairs            Wheelchair Mobility    Modified Rankin (Stroke Patients Only)       Balance                                            Cognition Arousal/Alertness: Awake/alert;Lethargic (Initially awake and alert. Lethargic at end. ) Behavior During Therapy: [facility] for tasks assessed/performed Overall Cognitive Status: Within Functional Limits for tasks assessed                                 General Comments: generally disengaged, but participated well      Exercises Other  Exercises Other Exercises: Supine therex performed to B LE's with supervision x12 reps: ankle pumps, SLR, hip abd, quad sets, glute sets, and heel slides. Required no cues for body mechanics/safety awareness.     General Comments        Pertinent Vitals/Pain Pain Assessment: No/denies pain    Home Living                      Prior Function            PT Goals (current goals can now be found in the care plan section) Acute Rehab PT Goals PT Goal Formulation: Patient unable to participate in goal setting Time For Goal  Achievement: 07/27/16 Potential to Achieve Goals: Fair Progress towards PT goals: Progressing toward goals    Frequency    Min 2X/week      PT Plan Current plan remains appropriate    Co-evaluation              AM-PAC PT "6 Clicks" Daily Activity  Outcome Measure  Difficulty turning over in bed (including adjusting bedclothes, sheets and blankets)?: A Little Difficulty moving from lying on back to sitting on the side of the bed? : A Little Difficulty sitting down on and standing up from a chair with arms (e.g., wheelchair, bedside commode, etc,.)?: Total Help needed moving to and from a bed to chair (including a wheelchair)?: A Little Help needed walking in hospital room?: A Little Help needed climbing 3-5 steps with a railing? : A Lot 6 Click Score: 15    End of Session Equipment Utilized During Treatment: Gait belt Activity Tolerance: Patient limited by lethargy;Other (comment) (and nausea) Patient left: in chair;with call bell/phone within reach;with chair alarm set Nurse Communication: Mobility status (nausea, BP, and bleeding from IV) PT Visit Diagnosis: Difficulty in walking, not elsewhere classified (R26.2);Muscle weakness (generalized) (M62.81)     Time: 5456-2563 PT Time Calculation (min) (ACUTE ONLY): 29 min  Charges:                       G Codes:       Oran Rein PT, SPT   Bevelyn Ngo 07/14/2016, 5:24 PM

## 2016-07-15 LAB — GLUCOSE, CAPILLARY
GLUCOSE-CAPILLARY: 118 mg/dL — AB (ref 65–99)
GLUCOSE-CAPILLARY: 167 mg/dL — AB (ref 65–99)
Glucose-Capillary: 40 mg/dL — CL (ref 65–99)
Glucose-Capillary: 75 mg/dL (ref 65–99)

## 2016-07-15 LAB — BASIC METABOLIC PANEL
Anion gap: 4 — ABNORMAL LOW (ref 5–15)
BUN: 10 mg/dL (ref 6–20)
CHLORIDE: 111 mmol/L (ref 101–111)
CO2: 28 mmol/L (ref 22–32)
CREATININE: 0.69 mg/dL (ref 0.44–1.00)
Calcium: 7.9 mg/dL — ABNORMAL LOW (ref 8.9–10.3)
GFR calc Af Amer: >60 mL/min (ref >60)
GFR calc non Af Amer: >60 mL/min (ref >60)
Glucose, Bld: 46 mg/dL — ABNORMAL LOW (ref 65–99)
Potassium: 2.9 mmol/L — ABNORMAL LOW (ref 3.5–5.1)
Sodium: 143 mmol/L (ref 135–145)

## 2016-07-15 LAB — MAGNESIUM: Magnesium: 2.3 mg/dL (ref 1.7–2.4)

## 2016-07-15 MED ORDER — POTASSIUM CHLORIDE CRYS ER 20 MEQ PO TBCR
20.0000 meq | EXTENDED_RELEASE_TABLET | Freq: Three times a day (TID) | ORAL | Status: AC
  Administered 2016-07-15 (×2): 20 meq via ORAL
  Filled 2016-07-15 (×2): qty 1

## 2016-07-15 NOTE — Progress Notes (Signed)
[facility] [place] NAME: Sherry [name]    MR#:  [id number]  DATE OF BIRTH:  1927/09/02  SUBJECTIVE:   Patient here due to generalized weakness, nausea, vomiting and also atypical chest pain. Nausea vomiting much improved since yesterday. Feels better. No acute chest pains or other events overnight.  REVIEW OF SYSTEMS:    Review of Systems  Constitutional: Negative for chills and fever.  HENT: Negative for congestion and tinnitus.   Eyes: Negative for blurred vision and double vision.  Respiratory: Negative for cough, shortness of breath and wheezing.   Cardiovascular: Negative for chest pain, orthopnea and PND.  Gastrointestinal: Negative for abdominal pain, diarrhea, nausea and vomiting.  Genitourinary: Negative for dysuria and hematuria.  Neurological: Positive for weakness. Negative for dizziness, sensory change and focal weakness.  All other systems reviewed and are negative.   Nutrition: Heart Healthy/Carb control Tolerating Diet: Yes Tolerating PT: Await Eval.   DRUG ALLERGIES:  No Known Allergies  VITALS:  Blood pressure (!) 153/50, pulse 82, temperature 97.8 F (36.6 C), temperature source Oral, resp. rate 18, height 5\' 4"  (1.626 m), weight 54.9 kg (121 lb), SpO2 97 %.  PHYSICAL EXAMINATION:   Physical Exam  GENERAL:  83 y.o.-year-old patient lying in bed in NAD. EYES: Pupils equal, round, reactive to light and accommodation. No scleral icterus. Extraocular muscles intact.  HEENT: Head atraumatic, normocephalic. Oropharynx and nasopharynx clear. Moist Oral Mucosa. NECK:  Supple, no jugular venous distention. No thyroid enlargement, no tenderness.  LUNGS: Normal breath sounds bilaterally, no wheezing, rales, rhonchi. No use of accessory muscles of respiration.  CARDIOVASCULAR: S1, S2 normal. No murmurs, rubs, or gallops.  ABDOMEN: Soft, nontender, nondistended. Bowel sounds present. No organomegaly or mass.  EXTREMITIES: No  cyanosis, clubbing or edema b/l.    NEUROLOGIC: Cranial nerves II through XII are intact. No focal Motor or sensory deficits b/l. Globally weak. PSYCHIATRIC: The patient is alert and oriented x 3.  SKIN: No obvious rash, lesion, or ulcer.    LABORATORY PANEL:   CBC  Recent Labs Lab 07/13/16 0740  WBC 4.8  HGB 12.0  HCT 36.1  PLT 230   ------------------------------------------------------------------------------------------------------------------  Chemistries   Recent Labs Lab 07/15/16 0513  NA 143  K 2.9*  CL 111  CO2 28  GLUCOSE 46*  BUN 10  CREATININE 0.69  CALCIUM 7.9*  MG 2.3   ------------------------------------------------------------------------------------------------------------------  Cardiac Enzymes  Recent Labs Lab 07/13/16 1502  TROPONINI 0.03*   ------------------------------------------------------------------------------------------------------------------  RADIOLOGY:  No results found.   ASSESSMENT AND PLAN:   83 year old female with past medical history of diabetes, hypertension, hyperlipidemia, history of pulmonary embolism, recent admission at [facility] for nausea vomiting and abdominal pain who presents to the hospital due to atypical chest pain, nausea vomiting.  1. Nausea vomiting-etiology unclear but suspected to be secondary to esophagitis. Patient was recently hospitalized at [facility] in May underwent an upper GI endoscopy which showed grade D esophagitis with a moderate size hiatal hernia. She was placed on PPI and Carafate. -Improved today with scheduled Zofran. Continue Phenergan as needed, continue supportive care with PPI and Carafate. Tolerating by mouth well today.   2. Atypical chest pain with elevated troponin-patient denies any chest pain presently. Cardiac markers 3 have been negative. -Appreciate cardiology consult and they recommended getting a repeat echocardiogram but she had one in April which was essentially  benign therefore will not repeat presently. -Continue aspirin, nitroglycerin, statin. Asymptomatic.  3. Hypokalemia-secondary to  the nausea vomiting. - will cont. To supplement and repeat in a.m.   4. History of a renal mass-patient has a renal mass which is being worked up by urology as an outpatient. She is scheduled for surgery by urology coming up later this month. Follow-up if any hematuria, we'll consult urology if needed.  5. History of pulmonary embolism-continue Eliquis.  6. Essential hypertension-continue Norvasc, hydrochlorothiazide  7. Diabetes type 2 without complication- was a bit hypoglycemic again this a.m. Due to Poor PO intake and improved w/ eating.  - will d/c Levemir, cont. SSI.   8. Hyperlipidemia-continue atorvastatin.  Await PT eval.   Appreciate palliative care input and continue full scope with care for now. May benefit from outpatient palliative to follow. Continue to be a full code for now  All the records are reviewed and case discussed with Care Management/Social Worker. Management plans discussed with the patient, family and they are in agreement.  CODE STATUS: Full code  DVT Prophylaxis: Eliquis  TOTAL TIME TAKING CARE OF THIS PATIENT: 25 minutes.   POSSIBLE D/C IN 1-2 DAYS, DEPENDING ON CLINICAL CONDITION.   Henreitta Leber M.D on 07/15/2016 at 11:47 AM  Between 7am to 6pm - Pager - [phone]  After 6pm go to [email or web address] - [employer] [facility]  Office  [phone]  CC: Primary care physician; Center, [facility]

## 2016-07-15 NOTE — Progress Notes (Signed)
Hypoglycemic Event  CBG: 40  Treatment: Orange Juice   Symptoms: lethargic  Follow-up CBG: [id number] CBG Result:75  Possible Reasons for Event: poor po intake   Comments/MD notified:    Gildardo Pounds

## 2016-07-15 NOTE — Progress Notes (Signed)
Physical Therapy Treatment Patient Details Name: DEEANNE DEININGER MRN: [id number] DOB: 02-Jun-1927 Today's Date: 07/15/2016    History of Present Illness presented to ER secondary to weakness, chest and abdominal pain since discharge from recent rehab stay; admitted with acute kidney injury related to dehydration and gastritis.  Cardiology consult pending due to reported chest pain; however, troponins flat, patient cleared for particiaption with session by attending physician Verdell Carmine)    PT Comments    After speaking with RN about patient's discharge plan, PT performed treatment where patient demonstrated independence with bed mobility and modified independence with transfers. Patient ambulates at quick cadence, requiring cues to keep RW closer and not push too far in front of her. Her HR increased to 115 bpm after 30' of ambulation and peaked at 120 bpm upon return to room. Returned to baseline levels upon 2 minutes of rest. Patient educated about slowing pace and performing shorter, more frequent bouts of activity to prevent exacerbations. Patient's d/c plan has been updated to home with HHPT when patient medically stable.  Follow Up Recommendations  Home health PT     Equipment Recommendations  Rolling walker with 5" wheels    Recommendations for Other Services       Precautions / Restrictions Precautions Precautions: Fall Restrictions Weight Bearing Restrictions: No    Mobility  Bed Mobility Overal bed mobility: Independent Bed Mobility: Supine to Sit;Sit to Supine     Supine to sit: Independent Sit to supine: Independent   General bed mobility comments: Patient performed supine to sit and sit to supine transfer independently.  Transfers Overall transfer level: Modified independent Equipment used: Rolling walker (2 wheeled) Transfers: Sit to/from Stand Sit to Stand: Modified independent (Device/Increase time)         General transfer comment: Patient moves from sit to  stand with modified independence and good safety awareness.  Ambulation/Gait Ambulation/Gait assistance: Min guard Ambulation Distance (Feet): 60 Feet Assistive device: Rolling walker (2 wheeled)       General Gait Details: Patient ambulates quickly, requiring cues to keep RW closer and slow cadence. Patient's HR assessed at standing rest break after 30' and noted to be 115 bpm. Denied chest pain. Returned to room at slower cadence with HR peaking 120 bpm and returning to resting levels within 2 minutes.   Stairs            Wheelchair Mobility    Modified Rankin (Stroke Patients Only)       Balance Overall balance assessment: Modified Independent Sitting-balance support: No upper extremity supported;Feet supported Sitting balance-Leahy Scale: Good     Standing balance support: Bilateral upper extremity supported Standing balance-Leahy Scale: Good                              Cognition Arousal/Alertness: Awake/alert Behavior During Therapy: WFL for tasks assessed/performed Overall Cognitive Status: Within Functional Limits for tasks assessed                                        Exercises      General Comments        Pertinent Vitals/Pain Pain Assessment: No/denies pain    Home Living                      Prior Function  PT Goals (current goals can now be found in the care plan section) Acute Rehab PT Goals Patient Stated Goal: "To return home" PT Goal Formulation: With patient Time For Goal Achievement: 07/27/16 Potential to Achieve Goals: Good Progress towards PT goals: Progressing toward goals    Frequency    Min 2X/week      PT Plan Discharge plan needs to be updated    Co-evaluation              AM-PAC PT "6 Clicks" Daily Activity  Outcome Measure  Difficulty turning over in bed (including adjusting bedclothes, sheets and blankets)?: None Difficulty moving from lying on back to  sitting on the side of the bed? : None Difficulty sitting down on and standing up from a chair with arms (e.g., wheelchair, bedside commode, etc,.)?: None Help needed moving to and from a bed to chair (including a wheelchair)?: A Little Help needed walking in hospital room?: A Little Help needed climbing 3-5 steps with a railing? : A Little 6 Click Score: 21    End of Session Equipment Utilized During Treatment: Gait belt Activity Tolerance: Patient tolerated treatment well;Treatment limited secondary to medical complications (Comment);Other (comment) (HR) Patient left: in bed;with call bell/phone within reach;with bed alarm set;with SCD's reapplied Nurse Communication: Other (comment) (HR) PT Visit Diagnosis: Muscle weakness (generalized) (M62.81);Difficulty in walking, not elsewhere classified (R26.2)     Time: 4695-0722 PT Time Calculation (min) (ACUTE ONLY): 12 min  Charges:  $Gait Training: 8-22 mins                    G Codes:          Dorice Lamas, PT, DPT 07/15/2016, 12:13 PM

## 2016-07-15 NOTE — Progress Notes (Addendum)
       [facility]  SUBJECTIVE: no chest pain   Vitals:   07/14/16 0859 07/14/16 1550 07/14/16 1935 07/15/16 0851  BP: (!) 128/41 (!) 124/46 (!) 134/47 (!) 153/50  Pulse: 96 86 73 82  Resp: 18 18 18    Temp: 97.6 F (36.4 C) 97.9 F (36.6 C) 97.8 F (36.6 C)   TempSrc: Oral Oral Oral   SpO2: 95% 98% 97%   Weight:      Height:        Intake/Output Summary (Last 24 hours) at 07/15/16 0946 Last data filed at 07/15/16 0900  Gross per 24 hour  Intake             2015 ml  Output              750 ml  Net             1265 ml    LABS: Basic Metabolic Panel:  Recent Labs  07/12/16 1916 07/13/16 0740 07/14/16 0416 07/15/16 0513  NA 142 144  --  143  K 3.3* 2.9* 3.2* 2.9*  CL 105 110  --  111  CO2 25 30  --  28  GLUCOSE 159* 71  --  46*  BUN 18 17  --  10  CREATININE 1.34* 1.13*  --  0.69  CALCIUM 9.0 8.6*  --  7.9*  MG 1.8  --  1.6* 2.3  PHOS 3.8  --   --   --    Liver Function Tests: No results for input(s): AST, ALT, ALKPHOS, BILITOT, PROT, ALBUMIN in the last 72 hours. No results for input(s): LIPASE, AMYLASE in the last 72 hours. CBC:  Recent Labs  07/12/16 1916 07/13/16 0740  WBC 4.8 4.8  HGB 13.2 12.0  HCT 39.6 36.1  MCV 83.3 83.3  PLT 249 230   Cardiac Enzymes:  Recent Labs  07/12/16 1902  07/13/16 0058 07/13/16 0740 07/13/16 1502  CKTOTAL 14*  --   --   --   --   TROPONINI  --   < > 0.03* 0.03* 0.03*  < > = values in this interval not displayed. BNP: Invalid input(s): POCBNP D-Dimer: No results for input(s): DDIMER in the last 72 hours. Hemoglobin A1C:  Recent Labs  07/12/16 1916  HGBA1C 7.2*   Fasting Lipid Panel: No results for input(s): CHOL, HDL, LDLCALC, TRIG, CHOLHDL, LDLDIRECT in the last 72 hours. Thyroid Function Tests:  Recent Labs  07/12/16 1916  TSH 1.549   Anemia Panel: No results for input(s): VITAMINB12, FOLATE, FERRITIN, TIBC, IRON, RETICCTPCT in the last 72  hours.   Physical Exam: Blood pressure (!) 153/50, pulse 82, temperature 97.8 F (36.6 C), temperature source Oral, resp. rate 18, height 5\' 4"  (1.626 m), weight 54.9 kg (121 lb), SpO2 97 %.   Wt Readings from Last 1 Encounters:  07/12/16 54.9 kg (121 lb)     General appearance: alert and cooperative Resp: clear to auscultation bilaterally Cardio: regular rate and rhythm Neurologic: Grossly normal  TELEMETRY: Reviewed telemetry pt in nsr:  ASSESSMENT AND PLAN:  Active Problems:   Chest pain, rule out acute myocardial infarction-ruled out for an mi. Echo revealed ef of 40-45%. In April. No need for repeat. No further cardiac work up indicated.    Dehydration   Renal mass    Teodoro Spray, MD, [facility] 07/15/2016 9:46 AM

## 2016-07-16 LAB — POTASSIUM: POTASSIUM: 4.4 mmol/L (ref 3.5–5.1)

## 2016-07-16 LAB — GLUCOSE, CAPILLARY
GLUCOSE-CAPILLARY: 137 mg/dL — AB (ref 65–99)
Glucose-Capillary: 123 mg/dL — ABNORMAL HIGH (ref 65–99)
Glucose-Capillary: 107 mg/dL — ABNORMAL HIGH (ref 65–99)
Glucose-Capillary: 143 mg/dL — ABNORMAL HIGH (ref 65–99)
Glucose-Capillary: 167 mg/dL — ABNORMAL HIGH (ref 65–99)

## 2016-07-16 MED ORDER — METOCLOPRAMIDE HCL 5 MG/ML IJ SOLN
5.0000 mg | Freq: Four times a day (QID) | INTRAMUSCULAR | Status: DC
  Administered 2016-07-16 – 2016-07-18 (×9): 5 mg via INTRAVENOUS
  Filled 2016-07-16 (×10): qty 2

## 2016-07-16 NOTE — Progress Notes (Signed)
S[name] NAME: Sherry [name]    MR#:  [id number]  DATE OF BIRTH:  [birth year]  SUBJECTIVE:   Patient here due to generalized weakness, nausea, vomiting and also atypical chest pain.  No further chest pain, was feeling better yesterday but today started to have worsening nausea vomiting again.  REVIEW OF SYSTEMS:    Review of Systems  Constitutional: Negative for chills and fever.  HENT: Negative for congestion and tinnitus.   Eyes: Negative for blurred vision and double vision.  Respiratory: Negative for cough, shortness of breath and wheezing.   Cardiovascular: Negative for chest pain, orthopnea and PND.  Gastrointestinal: Positive for nausea and vomiting. Negative for abdominal pain and diarrhea.  Genitourinary: Negative for dysuria and hematuria.  Neurological: Positive for weakness. Negative for dizziness, sensory change and focal weakness.  All other systems reviewed and are negative.   Nutrition: Heart Healthy/Carb control Tolerating Diet: Yes Tolerating PT: Await Eval.   DRUG ALLERGIES:  No Known Allergies  VITALS:  Blood pressure (!) 126/49, pulse 65, temperature 97.9 F (36.6 C), temperature source Oral, resp. rate 16, height 5\' 4"  (1.626 m), weight 54.9 kg (121 lb), SpO2 97 %.  PHYSICAL EXAMINATION:   Physical Exam  GENERAL:  83 y.o.-year-old patient lying in bed in NAD. EYES: Pupils equal, round, reactive to light and accommodation. No scleral icterus. Extraocular muscles intact.  HEENT: Head atraumatic, normocephalic. Oropharynx and nasopharynx clear. Moist Oral Mucosa. NECK:  Supple, no jugular venous distention. No thyroid enlargement, no tenderness.  LUNGS: Normal breath sounds bilaterally, no wheezing, rales, rhonchi. No use of accessory muscles of respiration.  CARDIOVASCULAR: S1, S2 normal. No murmurs, rubs, or gallops.  ABDOMEN: Soft, nontender, nondistended. Bowel sounds present. No organomegaly or  mass.  EXTREMITIES: No cyanosis, clubbing or edema b/l.    NEUROLOGIC: Cranial nerves II through XII are intact. No focal Motor or sensory deficits b/l. Globally weak. PSYCHIATRIC: The patient is alert and oriented x 3.  SKIN: No obvious rash, lesion, or ulcer.    LABORATORY PANEL:   CBC  Recent Labs Lab 07/13/16 0740  WBC 4.8  HGB 12.0  HCT 36.1  PLT 230   ------------------------------------------------------------------------------------------------------------------  Chemistries   Recent Labs Lab 07/15/16 0513 07/16/16 0332  NA 143  --   K 2.9* 4.4  CL 111  --   CO2 28  --   GLUCOSE 46*  --   BUN 10  --   CREATININE 0.69  --   CALCIUM 7.9*  --   MG 2.3  --    ------------------------------------------------------------------------------------------------------------------  Cardiac Enzymes  Recent Labs Lab 07/13/16 1502  TROPONINI 0.03*   ------------------------------------------------------------------------------------------------------------------  RADIOLOGY:  No results found.   ASSESSMENT AND PLAN:   83 year old female with past medical history of diabetes, hypertension, hyperlipidemia, history of pulmonary embolism, recent admission at [facility] for nausea vomiting and abdominal pain who presents to the hospital due to atypical chest pain, nausea vomiting.  1. Nausea vomiting-etiology unclear but suspected to be secondary to esophagitis. Patient was recently hospitalized at [facility] in May underwent an upper GI endoscopy which showed grade D esophagitis with a moderate size hiatal hernia. She was placed on PPI and Carafate. -Patient was feeling better yesterday with improved nausea vomiting but now worse again this morning. We'll continue supportive care with IV Zofran and Phenergan for nausea. Continue PPI and Carafate. We'll start the patient on some scheduled Reglan and follow.  2. Atypical chest  pain with elevated troponin-patient denies any  chest pain presently. Cardiac markers 3 have been negative. -Appreciate cardiology consult and they recommended getting a repeat echocardiogram but she had one in April which was essentially benign therefore will not repeat presently. -Continue aspirin, nitroglycerin, statin. Asymptomatic.  3. Hypokalemia-secondary to the nausea vomiting. - resolved and normalized with supplementation.   4. History of a renal mass-patient has a renal mass which is being worked up by urology as an outpatient. She is scheduled for surgery by urology coming up later this month. Follow-up if any hematuria, we'll consult urology if needed.  5. History of pulmonary embolism-continue Eliquis.  6. Essential hypertension-continue Norvasc, hydrochlorothiazide  7. Diabetes type 2 without complication- no further hypoglycemia overnight. Cont. SSI.   8. Hyperlipidemia-continue atorvastatin.  PT eval appreciate and will arrange Home Health upon discharge once GI issues improved.   Appreciate palliative care input and continue full scope with care for now. May benefit from outpatient palliative to follow. Continue to be a full code for now  All the records are reviewed and case discussed with Care Management/Social Worker. Management plans discussed with the patient, family and they are in agreement.  CODE STATUS: Full code  DVT Prophylaxis: Eliquis  TOTAL TIME TAKING CARE OF THIS PATIENT: 25 minutes.   POSSIBLE D/C IN 1-2 DAYS, DEPENDING ON CLINICAL CONDITION.   Henreitta Leber M.D on 07/16/2016 at 12:01 PM  Between 7am to 6pm - Pager - [phone]  After 6pm go to [email or web address] - [employer] [facility]  Office  [phone]  CC: Primary care physician; Center, [facility]

## 2016-07-16 NOTE — Plan of Care (Signed)
Problem: Safety: Goal: Ability to remain free from injury will improve Outcome: Progressing Fall precautions in place, non skid socks when oob  Problem: Pain Managment: Goal: General experience of comfort will improve Outcome: Progressing Prn medications  Problem: Tissue Perfusion: Goal: Risk factors for ineffective tissue perfusion will decrease Outcome: Progressing PO Eliquis  Problem: Nutrition: Goal: Adequate nutrition will be maintained Outcome: Not Progressing Still having nausea

## 2016-07-16 NOTE — Progress Notes (Signed)
Zofran 4mg  po given for nausea, will monitor.

## 2016-07-17 LAB — CBC
HEMATOCRIT: 30.4 % — AB (ref 35.0–47.0)
HEMOGLOBIN: 10.3 g/dL — AB (ref 12.0–16.0)
MCH: 28 pg (ref 26.0–34.0)
MCHC: 33.7 g/dL (ref 32.0–36.0)
MCV: 83.1 fL (ref 80.0–100.0)
Platelets: 164 10*3/uL (ref 150–440)
RBC: 3.66 MIL/uL — AB (ref 3.80–5.20)
RDW: 14.6 % — ABNORMAL HIGH (ref 11.5–14.5)
WBC: 7.1 10*3/uL (ref 3.6–11.0)

## 2016-07-17 LAB — GLUCOSE, CAPILLARY
GLUCOSE-CAPILLARY: 141 mg/dL — AB (ref 65–99)
Glucose-Capillary: 120 mg/dL — ABNORMAL HIGH (ref 65–99)
Glucose-Capillary: 150 mg/dL — ABNORMAL HIGH (ref 65–99)
Glucose-Capillary: 131 mg/dL — ABNORMAL HIGH (ref 65–99)

## 2016-07-17 NOTE — Care Management Important Message (Signed)
Important Message  Patient Details  Name: Sherry [name] MRN: [id number] Date of Birth: Dec 28, 1927   Medicare Important Message Given:  Yes    Katrina Stack, RN 07/17/2016, 4:22 PM

## 2016-07-17 NOTE — Progress Notes (Signed)
Gave patient all of her morning medications this morning crushed in applesauce. 5 minutes after receiving medications patient threw up. Cleaned her up and assisted her to the chair. Stated she does not feel nauseous at the moment she just didn't like the taste of the medications.

## 2016-07-17 NOTE — Progress Notes (Signed)
[place] at [place] NAME: Sherry [name]    MR#:  [id number]  DATE OF BIRTH:  07/03/1927  SUBJECTIVE:  Threw up er meds this am in applesauce. States she is usually ok during the day but not in evening. BP low REVIEW OF SYSTEMS:    Review of Systems  Constitutional: Negative for chills and fever.  HENT: Negative for congestion and tinnitus.   Eyes: Negative for blurred vision and double vision.  Respiratory: Negative for cough, shortness of breath and wheezing.   Cardiovascular: Negative for chest pain, orthopnea and PND.  Gastrointestinal: Positive for nausea. Negative for abdominal pain and diarrhea.  Genitourinary: Negative for dysuria and hematuria.  Neurological: Positive for weakness. Negative for dizziness, sensory change and focal weakness.  All other systems reviewed and are negative.   Nutrition: Heart Healthy/Carb control Tolerating Diet: Yes Tolerating PT: HHPT   DRUG ALLERGIES:  No Known Allergies  VITALS:  Blood pressure (!) 97/48, pulse 67, temperature 98.3 F (36.8 C), resp. rate 10, height 5\' 4"  (1.626 m), weight 54.9 kg (121 lb), SpO2 95 %.  PHYSICAL EXAMINATION:   Physical Exam  GENERAL:  83 y.o.-year-old patient lying in bed in NAD. EYES: Pupils equal, round, reactive to light and accommodation. No scleral icterus. Extraocular muscles intact.  HEENT: Head atraumatic, normocephalic. Oropharynx and nasopharynx clear. Moist Oral Mucosa. NECK:  Supple, no jugular venous distention. No thyroid enlargement, no tenderness.  LUNGS: Normal breath sounds bilaterally, no wheezing, rales, rhonchi. No use of accessory muscles of respiration.  CARDIOVASCULAR: S1, S2 normal. No murmurs, rubs, or gallops.  ABDOMEN: Soft, nontender, nondistended. Bowel sounds present. No organomegaly or mass.  EXTREMITIES: No cyanosis, clubbing or edema b/l.    NEUROLOGIC: Cranial nerves II through XII are intact. No focal Motor or sensory  deficits b/l. Globally weak. PSYCHIATRIC: The patient is alert and oriented x 3.  SKIN: No obvious rash, lesion, or ulcer.  LABORATORY PANEL:   CBC  Recent Labs Lab 07/17/16 0701  WBC 7.1  HGB 10.3*  HCT 30.4*  PLT 164   ------------------------------------------------------------------------------------------------------------------  Chemistries   Recent Labs Lab 07/15/16 0513 07/16/16 0332  NA 143  --   K 2.9* 4.4  CL 111  --   CO2 28  --   GLUCOSE 46*  --   BUN 10  --   CREATININE 0.69  --   CALCIUM 7.9*  --   MG 2.3  --    ------------------------------------------------------------------------------------------------------------------  Cardiac Enzymes  Recent Labs Lab 07/13/16 1502  TROPONINI 0.03*   ------------------------------------------------------------------------------------------------------------------  RADIOLOGY:  No results found.   ASSESSMENT AND PLAN:  83 year old female with past medical history of diabetes, hypertension, hyperlipidemia, history of pulmonary embolism, recent admission at [facility] for nausea vomiting and abdominal pain who presents to the hospital due to atypical chest pain, nausea vomiting.  1. Nausea vomiting-etiology unclear but suspected to be secondary to esophagitis. Patient was recently hospitalized at [facility] in May underwent an upper GI endoscopy which showed grade D esophagitis with a moderate size hiatal hernia. She was placed on PPI and Carafate. -Patient was feeling better yesterday with improved nausea vomiting but now worse again this morning. We'll continue supportive care with IV Zofran and Phenergan for nausea. Continue PPI and Carafate.  - continue scheduled Reglan and monitor.  2. Atypical chest pain with elevated troponin-patient denies any chest pain presently. Cardiac markers 3 have been negative. -Appreciate cardiology input -Continue aspirin, nitroglycerin, statin. Asymptomatic.  3.  Hypokalemia-secondary to the nausea vomiting. - resolved and normalized with supplementation.   4. History of a renal mass-patient has a renal mass which is being worked up by urology as an outpatient. She is scheduled for surgery by urology coming up later this month. Follow-up if any hematuria, we'll consult urology if needed.  5. History of pulmonary embolism-continue Eliquis.  6. Essential hypertension-continue Norvasc, hydrochlorothiazide  7. Diabetes type 2 without complication- no further hypoglycemia overnight. Cont. SSI.   8. Hyperlipidemia-continue atorvastatin.  PT eval appreciate and will arrange Home Health upon discharge once GI issues improved.   Appreciate palliative care input and continue full scope with care for now. May benefit from outpatient palliative to follow. Continue to be a full code for now  Patient wanted me to call her daughter so Sherry [name] calling her daughter Sherry [name] on both home and cell phone but no answer.  All the records are reviewed and case discussed with Care Management/Social Worker. Management plans discussed with the patient, nursing and they are in agreement.  CODE STATUS: Full code  DVT Prophylaxis: Eliquis  TOTAL TIME TAKING CARE OF THIS PATIENT: 25 minutes.   POSSIBLE D/C IN 1-2 DAYS, DEPENDING ON CLINICAL CONDITION.   Sherry [name] M.D on 07/17/2016 at 3:45 PM  Between 7am to 6pm - Pager - [phone]  After 6pm go to [email or web address] - [employer] [facility]  Office  [phone]  CC: Primary care physician; Center, [facility]

## 2016-07-17 NOTE — Care Management (Signed)
Continue to anticipate discharge home with home health. Palliative consult is in progress.  Left voicemail for daughter as she has not returned attending's call.  Informed that patient would discharge home tomorrow.   Notified [facility].  Hope to have palliative also follow in the home. Unsure if palliative has made contact with daughter.  This will not hold up discharge.

## 2016-07-17 NOTE — Progress Notes (Signed)
Physical Therapy Treatment Patient Details Name: Sherry [name] MRN: [id number] DOB: 02/08/1927 Today's Date: 07/17/2016    History of Present Illness presented to ER secondary to weakness, chest and abdominal pain since discharge from recent rehab stay; admitted with acute kidney injury related to dehydration and gastritis.  Cardiology consult pending due to reported chest pain; however, troponins flat, patient cleared for particiaption with session by attending physician Verdell Carmine)    PT Comments    Bed mobility not assessed as pt was in chair upon arrival. Tranfers performed with modI and ambulation with CGA, due to impaired generalized strength, endurance, and dizziness. Pt amb total of 15 ft, then reported dizziness and requested to sit; pt became lethargic and reported not feeling well, but denied chest pain/nausea. Dizziness dissipated after 2-3 min of seated rest break. Max HR at 115 bpm with seated therex; HR at 108 bpm at end of session. Pt cont to present with the following deficits: strength and endurance. Overall, pt responded well to today's treatment with no adverse affects; nursing notified of pt's lethargy at end of session. Pt would benefit from cont skilled PT to address the previously mentioned impairments and promote return to PLOF. Currently recommending HHPT pending d/c    Follow Up Recommendations  Home health PT     Equipment Recommendations  Rolling walker with 5" wheels    Recommendations for Other Services       Precautions / Restrictions Precautions Precautions: Fall Restrictions Weight Bearing Restrictions: No    Mobility  Bed Mobility               General bed mobility comments: Pt in chair upon arrival; bed mobility not assessed at this time.   Transfers Overall transfer level: Modified independent Equipment used: Rolling walker (2 wheeled) Transfers: Sit to/from Stand Sit to Stand: Modified independent (Device/Increase time)          General transfer comment: Able to perform STS with ModI, requiring no cues for body mechanics/safety awareness.   Ambulation/Gait Ambulation/Gait assistance: Min guard Ambulation Distance (Feet): 15 Feet Assistive device: Rolling walker (2 wheeled) Gait Pattern/deviations: Step-through pattern     General Gait Details: Pt cont to amb quickly, though demonstrated better awareness of safety than previous sessions. Required min verbal cues for maintaining close proximity to RW. Pt education re: taking her time and standing close to RW; verbalizing dizziness or other signs/symptoms to SPT. After 15 ft pt became dizzy, and requried seated rest break. She was highly lethargic, and reported not feeling well. Reported no chest pain or nausea. HR 108 BPM.   Stairs            Wheelchair Mobility    Modified Rankin (Stroke Patients Only)       Balance                                            Cognition Arousal/Alertness: Awake/alert;Lethargic (Lethargic after amb.) Behavior During Therapy: WFL for tasks assessed/performed Overall Cognitive Status: Within Functional Limits for tasks assessed                                 General Comments: generally disengaged, but participated well      Exercises Other Exercises Other Exercises: Seated therex to B LE's/UEs with supervision x15 reps: ankle pumps, marches,  hip add pillow squeezes, hip abd, quad sets, glute squeezes, Shoulder flex. Required min verbal/tactile cues for quad sets, but performed the rest with good mechanics and safety awareness.     General Comments        Pertinent Vitals/Pain Pain Assessment: No/denies pain    Home Living                      Prior Function            PT Goals (current goals can now be found in the care plan section) Acute Rehab PT Goals Patient Stated Goal: "To return home" PT Goal Formulation: With patient Time For Goal Achievement:  07/27/16 Potential to Achieve Goals: Good Progress towards PT goals: Progressing toward goals    Frequency    Min 2X/week      PT Plan Current plan remains appropriate    Co-evaluation              AM-PAC PT "6 Clicks" Daily Activity  Outcome Measure  Difficulty turning over in bed (including adjusting bedclothes, sheets and blankets)?: None Difficulty moving from lying on back to sitting on the side of the bed? : None Difficulty sitting down on and standing up from a chair with arms (e.g., wheelchair, bedside commode, etc,.)?: None Help needed moving to and from a bed to chair (including a wheelchair)?: A Little Help needed walking in hospital room?: A Little Help needed climbing 3-5 steps with a railing? : A Lot 6 Click Score: 20    End of Session Equipment Utilized During Treatment: Gait belt Activity Tolerance: Patient tolerated treatment well;Patient limited by lethargy Patient left: in chair;with call bell/phone within reach;with chair alarm set Nurse Communication: Mobility status PT Visit Diagnosis: Muscle weakness (generalized) (M62.81);Difficulty in walking, not elsewhere classified (R26.2)     Time: 8003-4917 PT Time Calculation (min) (ACUTE ONLY): 21 min  Charges:                       G Codes:       Oran Rein PT, SPT   Bevelyn Ngo 07/17/2016, 10:29 AM

## 2016-07-18 ENCOUNTER — Inpatient Hospital Stay: Payer: Medicare Other

## 2016-07-18 LAB — BASIC METABOLIC PANEL
ANION GAP: 4 — AB (ref 5–15)
BUN: 14 mg/dL (ref 6–20)
CALCIUM: 8 mg/dL — AB (ref 8.9–10.3)
CHLORIDE: 100 mmol/L — AB (ref 101–111)
CO2: 31 mmol/L (ref 22–32)
Creatinine, Ser: 1.08 mg/dL — ABNORMAL HIGH (ref 0.44–1.00)
GFR calc non Af Amer: 45 mL/min — ABNORMAL LOW (ref >60)
GFR, EST AFRICAN AMERICAN: 52 mL/min — AB (ref >60)
Glucose, Bld: 124 mg/dL — ABNORMAL HIGH (ref 65–99)
POTASSIUM: 3.3 mmol/L — AB (ref 3.5–5.1)
Sodium: 135 mmol/L (ref 135–145)

## 2016-07-18 LAB — CBC
HEMATOCRIT: 28.8 % — AB (ref 35.0–47.0)
HEMOGLOBIN: 9.7 g/dL — AB (ref 12.0–16.0)
MCH: 28 pg (ref 26.0–34.0)
MCHC: 33.8 g/dL (ref 32.0–36.0)
MCV: 82.6 fL (ref 80.0–100.0)
Platelets: 158 10*3/uL (ref 150–440)
RBC: 3.49 MIL/uL — ABNORMAL LOW (ref 3.80–5.20)
RDW: 14.4 % (ref 11.5–14.5)
WBC: 4.7 10*3/uL (ref 3.6–11.0)

## 2016-07-18 LAB — GLUCOSE, CAPILLARY
Glucose-Capillary: 126 mg/dL — ABNORMAL HIGH (ref 65–99)
Glucose-Capillary: 162 mg/dL — ABNORMAL HIGH (ref 65–99)
Glucose-Capillary: 128 mg/dL — ABNORMAL HIGH (ref 65–99)

## 2016-07-18 NOTE — Progress Notes (Addendum)
[place] at [place] NAME: Sherry [name]    MR#:  [id number]  DATE OF BIRTH:  1927-04-18  SUBJECTIVE:  Sitting in the chair, feeling tired.  Says she is throwing up, although there is nothing in the bag.  She is just gagging and producing some phlegm. REVIEW OF SYSTEMS:    Review of Systems  Constitutional: Negative for chills and fever.  HENT: Negative for congestion and tinnitus.   Eyes: Negative for blurred vision and double vision.  Respiratory: Negative for cough, shortness of breath and wheezing.   Cardiovascular: Negative for chest pain, orthopnea and PND.  Gastrointestinal: Positive for nausea. Negative for abdominal pain and diarrhea.  Genitourinary: Negative for dysuria and hematuria.  Neurological: Positive for weakness. Negative for dizziness, sensory change and focal weakness.  All other systems reviewed and are negative.   Nutrition: Heart Healthy/Carb control Tolerating Diet: Yes Tolerating PT: HHPT  DRUG ALLERGIES:  No Known Allergies VITALS:  Blood pressure 115/79, pulse 88, temperature 97.7 F (36.5 C), temperature source Oral, resp. rate 14, height 5\' 4"  (1.626 m), weight 54.9 kg (121 lb), SpO2 98 %. PHYSICAL EXAMINATION:   Physical Exam  GENERAL:  83 y.o.-year-old patient lying in bed in NAD. EYES: Pupils equal, round, reactive to light and accommodation. No scleral icterus. Extraocular muscles intact.  HEENT: Head atraumatic, normocephalic. Oropharynx and nasopharynx clear. Moist Oral Mucosa. NECK:  Supple, no jugular venous distention. No thyroid enlargement, no tenderness.  LUNGS: Normal breath sounds bilaterally, no wheezing, rales, rhonchi. No use of accessory muscles of respiration.  CARDIOVASCULAR: S1, S2 normal. No murmurs, rubs, or gallops.  ABDOMEN: Soft, nontender, nondistended. Bowel sounds present. No organomegaly or mass.  EXTREMITIES: No cyanosis, clubbing or edema b/l.    NEUROLOGIC: Cranial nerves II  through XII are intact. No focal Motor or sensory deficits b/l. Globally weak. PSYCHIATRIC: The patient is alert and oriented x 3.  SKIN: No obvious rash, lesion, or ulcer.  LABORATORY PANEL:   CBC  Recent Labs Lab 07/18/16 0513  WBC 4.7  HGB 9.7*  HCT 28.8*  PLT 158   ------------------------------------------------------------------------------------------------------------------  Chemistries   Recent Labs Lab 07/15/16 0513  07/18/16 0513  NA 143  --  135  K 2.9*  < > 3.3*  CL 111  --  100*  CO2 28  --  31  GLUCOSE 46*  --  124*  BUN 10  --  14  CREATININE 0.69  --  1.08*  CALCIUM 7.9*  --  8.0*  MG 2.3  --   --   < > = values in this interval not displayed. ------------------------------------------------------------------------------------------------------------------  Cardiac Enzymes  Recent Labs Lab 07/13/16 1502  TROPONINI 0.03*   ------------------------------------------------------------------------------------------------------------------  RADIOLOGY:  Dg Abd 1 View  Result Date: 07/18/2016 CLINICAL DATA:  Nausea, vomiting. EXAM: ABDOMEN - 1 VIEW COMPARISON:  CT scan of July 13, 2016. FINDINGS: The bowel gas pattern is normal. Mild amount of stool is seen in the colon. Phleboliths are noted in the pelvis. IMPRESSION: No evidence of bowel obstruction or ileus. Electronically Signed   By: Marijo Conception, M.D.   On: 07/18/2016 13:22     ASSESSMENT AND PLAN:  83 year old female with past medical history of diabetes, hypertension, hyperlipidemia, history of pulmonary embolism, recent admission at [facility] for nausea vomiting and abdominal pain who presents to the hospital due to atypical chest pain, nausea vomiting.  1. Nausea vomiting-etiology unclear but suspected to be secondary to  esophagitis. Patient was recently hospitalized at [facility] in May underwent an upper GI endoscopy which showed grade D esophagitis with a moderate size hiatal hernia. -  continue supportive care with IV Zofran and Phenergan for nausea. Continue PPI and Carafate.  - continue scheduled Reglan and monitor.  2. Atypical chest pain with elevated troponin-patient denies any chest pain presently. Cardiac markers 3 have been negative. -Appreciate cardiology input -Continue aspirin, nitroglycerin, statin. Asymptomatic.  3. Hypokalemia-secondary to the nausea vomiting. - replete and recheck  4. History of a renal mass-patient has a renal mass which is being worked up by urology as an outpatient. She is scheduled for surgery by urology coming up later this month. Follow-up if any hematuria,  - Outpatient urology follow-up  5. History of pulmonary embolism-continue Eliquis.  6. Essential hypertension-continue Norvasc, hydrochlorothiazide  7. Diabetes type 2 without complication- no further hypoglycemia overnight. Cont. SSI.   8. Hyperlipidemia-continue atorvastatin.   PT eval appreciate and will arrange Home Health upon discharge once GI issues improved.   Appreciate Palliative care input and continue full scope with care for now. May benefit from outpatient palliative to follow. Continue to be a full code for now    So far, I have been unsuccessful in reaching out to her daughter.  Left a message yesterday, left her message again today, but no success.  Care management has also been trying same.  All the records are reviewed and case discussed with Care Management/Social Worker. Management plans discussed with the patient, nursing and they are in agreement.  CODE STATUS: Full code  DVT Prophylaxis: Eliquis  TOTAL TIME TAKING CARE OF THIS PATIENT: 15 minutes.   POSSIBLE D/C IN 1-2 DAYS, DEPENDING ON CLINICAL CONDITION.   Max Sane M.D on 07/18/2016 at 2:44 PM  Between 7am to 6pm - Pager - [phone]  After 6pm go to [email or web address] - [employer] [facility]  Office  [phone]  CC: Primary care physician;  Center, [facility]

## 2016-07-18 NOTE — Care Management (Addendum)
RNCM spoke with Gerald Stabs [phone] (Lisa's husband) regarding RNCM Nann trying to reach them due to patient's discharge to home today with [facility] home health services. Per Gerald Stabs "[name] has been at work with poor phone reception but that she has reached out to [name] (patient's son) to pick patient up today to go home. Patient updated and agrees.

## 2016-07-18 NOTE — Progress Notes (Signed)
New referral for Home Palliative services received from [facility]. Patient information faxed to referral.Plan is for patient to discharge home today. Thank you. Flo Shanks RN, [facility], hospital liaison [phone] c

## 2016-07-18 NOTE — Progress Notes (Signed)
Physical Therapy Treatment Patient Details Name: Sherry [name] MRN: [id number] DOB: 26-May-1927 Today's Date: 07/18/2016    History of Present Illness presented to ER secondary to weakness, chest and abdominal pain since discharge from recent rehab stay; admitted with acute kidney injury related to dehydration and gastritis.  Cardiology consult pending due to reported chest pain; however, troponins flat, patient cleared for particiaption with session by attending physician Verdell Carmine)    PT Comments    Pt progressing well toward goals. Ambulated 13ft 2x this session with RW and min guard for safety. Occasional min assist due to pt's tendency to make quick turns with the walker far away from her - required mod verbal cueing to manage RW safely (pt bumped into wall 2x). HR peaked to 108bpm after first 40ft ambulated and 118bpm after second loop (also 83ft). Pt states she feels weak ambulating, but no decrease in gait speed noted. Will continue to progress.    Follow Up Recommendations  Home health PT     Equipment Recommendations  Rolling walker with 5" wheels    Recommendations for Other Services       Precautions / Restrictions Precautions Precautions: Fall Restrictions Weight Bearing Restrictions: No    Mobility  Bed Mobility Overal bed mobility: Independent Bed Mobility: Supine to Sit     Supine to sit: Independent     General bed mobility comments: Supine to sit independently with good/safe technique performed. No dizziness once EOB.  Transfers Overall transfer level: Modified independent Equipment used: Rolling walker (2 wheeled) Transfers: Sit to/from Stand Sit to Stand: Modified independent (Device/Increase time)         General transfer comment: Sit to/from stand performed 2x this session with mod I due to increased time required. Pt steady coming to feet with no dizziness noted.  Ambulation/Gait Ambulation/Gait assistance: Min guard Ambulation Distance (Feet):  80 Feet Assistive device: Rolling walker (2 wheeled) Gait Pattern/deviations: Step-through pattern;Wide base of support;Trunk flexed     General Gait Details: Ambulated with RW from EOB, around room, to nurse's station and back to EOB (performed 2x for total of 124ft - 49ft x2). Min guard with occassional min assist due to unsteadiness and pt's tendency to make quick turns and ambulate quickly with the walker far away from her. Mod verbal cueing to correct walker position (bumped into wall 2x). No dizziness noted. Pt stated she felt weak while walking. HR peaked at 108bpm after first loop and 117bpm after second.    Stairs            Wheelchair Mobility    Modified Rankin (Stroke Patients Only)       Balance Overall balance assessment: Modified Independent (Mildly impaired when turning and with increased gait speed)                                          Cognition Arousal/Alertness: Awake/alert;Lethargic Behavior During Therapy: WFL for tasks assessed/performed Overall Cognitive Status: Within Functional Limits for tasks assessed                                 General Comments: Mildly impulsive with decreased safety awareness - bumped RW into wall 2x, pushed walker too far in front of her, made turns quickly, etc.      Exercises Other Exercises Other Exercises: Supine ther-ex x10  B included ankle pump, SLR's, and hip abd. Performed with supervision.    General Comments        Pertinent Vitals/Pain Pain Assessment: No/denies pain    Home Living                      Prior Function            PT Goals (current goals can now be found in the care plan section) Acute Rehab PT Goals Patient Stated Goal: "To return home" PT Goal Formulation: With patient Time For Goal Achievement: 07/27/16 Potential to Achieve Goals: Good Progress towards PT goals: Progressing toward goals    Frequency    Min 2X/week      PT Plan  Current plan remains appropriate    Co-evaluation              AM-PAC PT "6 Clicks" Daily Activity  Outcome Measure  Difficulty turning over in bed (including adjusting bedclothes, sheets and blankets)?: None Difficulty moving from lying on back to sitting on the side of the bed? : None Difficulty sitting down on and standing up from a chair with arms (e.g., wheelchair, bedside commode, etc,.)?: None Help needed moving to and from a bed to chair (including a wheelchair)?: A Little Help needed walking in hospital room?: A Little Help needed climbing 3-5 steps with a railing? : A Little 6 Click Score: 21    End of Session Equipment Utilized During Treatment: Gait belt Activity Tolerance: Patient tolerated treatment well;Patient limited by fatigue Patient left: in chair;with call bell/phone within reach;with chair alarm set Nurse Communication: Mobility status PT Visit Diagnosis: Muscle weakness (generalized) (M62.81);Difficulty in walking, not elsewhere classified (R26.2)     Time: 6004-5997 PT Time Calculation (min) (ACUTE ONLY): 15 min  Charges:                       G Codes:       Donaciano Eva, PT, SPT  Marni Griffon 07/18/2016, 3:45 PM

## 2016-07-18 NOTE — Progress Notes (Signed)
Family Meeting Note  Advance Directive:no  Today a meeting took place with the Patient.  The following clinical team members were present during this meeting:MD  The following were discussed:Patient's diagnosis: , Patient's progosis: < 12 months and Goals for treatment: Full Code  Additional follow-up to be provided: Full code, palliative care on going f/up, poor prognosis  Time spent during discussion:20 minutes  Sherry Sane, MD

## 2016-07-18 NOTE — Care Management (Signed)
Have left another voicemail message for patient's daughter at home and cell to discuss anticipated discharge for today. Updated attending

## 2016-07-18 NOTE — Discharge Instructions (Signed)
Nausea and Vomiting, Adult Feeling sick to your stomach (nausea) means that your stomach is upset or you feel like you have to throw up (vomit). Feeling more and more sick to your stomach can lead to throwing up. Throwing up happens when food and liquid from your stomach are thrown up and out the mouth. Throwing up can make you feel weak and cause you to get dehydrated. Dehydration can make you tired and thirsty, make you have a dry mouth, and make it so you pee (urinate) less often. Older adults and people with other diseases or a weak defense system (immune system) are at higher risk for dehydration. If you feel sick to your stomach or if you throw up, it is important to follow instructions from your doctor about how to take care of yourself. Follow these instructions at home: Eating and drinking Follow these instructions as told by your doctor:  Take an oral rehydration solution (ORS). This is a drink that is sold at pharmacies and stores.  Drink clear fluids in small amounts as you are able, such as: ? Water. ? Ice chips. ? Diluted fruit juice. ? Low-calorie sports drinks.  Eat bland, easy-to-digest foods in small amounts as you are able, such as: ? Bananas. ? Applesauce. ? Rice. ? Low-fat (lean) meats. ? Toast. ? Crackers.  Avoid fluids that have a lot of sugar or caffeine in them.  Avoid alcohol.  Avoid spicy or fatty foods.  General instructions  Drink enough fluid to keep your pee (urine) clear or pale yellow.  Wash your hands often. If you cannot use soap and water, use hand sanitizer.  Make sure that all people in your home wash their hands well and often.  Take over-the-counter and prescription medicines only as told by your doctor.  Rest at home while you get better.  Watch your condition for any changes.  Breathe slowly and deeply when you feel sick to your stomach.  Keep all follow-up visits as told by your doctor. This is important. Contact a doctor  if:  You have a fever.  You cannot keep fluids down.  Your symptoms get worse.  You have new symptoms.  You feel sick to your stomach for more than two days.  You feel light-headed or dizzy.  You have a headache.  You have muscle cramps. Get help right away if:  You have pain in your chest, neck, arm, or jaw.  You feel very weak or you pass out (faint).  You throw up again and again.  You see blood in your throw-up.  Your throw-up looks like black coffee grounds.  You have bloody or black poop (stools) or poop that look like tar.  You have a very bad headache, a stiff neck, or both.  You have a rash.  You have very bad pain, cramping, or bloating in your belly (abdomen).  You have trouble breathing.  You are breathing very quickly.  Your heart is beating very quickly.  Your skin feels cold and clammy.  You feel confused.  You have pain when you pee.  You have signs of dehydration, such as: ? Dark pee, hardly any pee, or no pee. ? Cracked lips. ? Dry mouth. ? Sunken eyes. ? Sleepiness. ? Weakness. These symptoms may be an emergency. Do not wait to see if the symptoms will go away. Get medical help right away. Call your local emergency services (911 in the [place] Do not drive yourself to the hospital. This information is   not intended to replace advice given to you by your health care provider. Make sure you discuss any questions you have with your health care provider. Document Released: 06/14/2007 Document Revised: 07/16/2015 Document Reviewed: 09/01/2014 Elsevier Interactive Patient Education  2018 Elsevier Inc.  

## 2016-07-18 NOTE — Care Management (Addendum)
Police was able to make contact with residence and son [name] [phone]) contacted CM.  He spoke with [name] "who is sick" and both are agreeable to discharge home with home health today.   CM Lead spoke with Lisa's husband Gerald Stabs and he informed CM [name] was at work. [name] will transport home.  Notified attending and primary nurse.  Notified [facility] and added SW to the referral.  [name] will be contacted by primary nurse when discharge is complete for transport home.  Added palliative as outpatient  to discharge orders.  Craige Cotta notified.

## 2016-07-18 NOTE — Care Management (Signed)
[name] has not returned call to attending nor CM.  Spoke with patient and she has not been able to get in contact with her daughter.  CM contacted [place] police to go to residence and ask that [facility] be contacted.  CM later placed call to the home and [name] answered the house phone.  Lisa hung up when CM gave name but there was also a bad connection.  When CM called right back there was no answer.  CM left message of urgent need to return call and that at present there is enough concern to call DSS.  Attempted to call patient's son [name] but his phone number from a previous encounter has been disconnected and patient does not know his cell number.Updated Therapist, sports, charge nurse, CM Team Lead and attending

## 2016-07-18 NOTE — Progress Notes (Signed)
Discharge instructions explained to pt and pts son/ verbalized an understanding/ iv and tele removed/ transported off unit via wheelchair 

## 2016-07-20 NOTE — Discharge Summary (Signed)
[facility] [place] NAME: Sherry [name]    MR#:  [id number]  DATE OF BIRTH:  10-05-1927  DATE OF ADMISSION:  07/12/2016   ADMITTING PHYSICIAN: Harvie Bridge, DO  DATE OF DISCHARGE: 07/18/2016  5:26 PM  PRIMARY CARE PHYSICIAN: [facility] [place]   ADMISSION DIAGNOSIS:  Dehydration [E86.0] AKI (acute kidney injury) ([place]) [N17.9] Moderate risk chest pain [R07.9] DISCHARGE DIAGNOSIS:  Active Problems:   Chest pain, rule out acute myocardial infarction   Dehydration   Renal mass  SECONDARY DIAGNOSIS:   Past Medical History:  Diagnosis Date  . Diabetes mellitus without complication ([place])   . Hyperlipidemia   . Hypertension   . Pulmonary embolism [facility])    HOSPITAL COURSE:  83 year old female with past medical history of diabetes, hypertension, hyperlipidemia, history of pulmonary embolism, recent admission at [facility] for nausea vomiting and abdominal pain who presents to the hospital due to atypical chest pain, nausea vomiting.  1. Nausea vomiting-etiology unclear but suspected to be secondary to esophagitis. Patient was recently hospitalized at [facility] in May underwent an upper GI endoscopy which showed grade D esophagitis with a moderate size hiatal hernia. - continue supportive care with IV Zofran and Phenergan for nausea. Continue PPI and Carafate.  - continue scheduled Reglan and monitor.  2. Atypical chest pain with elevated troponin-patient denies any chest pain presently. Cardiac markers 3 have been negative. -Appreciate cardiology input -Continue aspirin, nitroglycerin, statin. Asymptomatic.  3. Hypokalemia-secondary to the nausea vomiting. - repleted and resolved  4. History of a renal mass-patient has a renal mass which is being worked up by urology as an outpatient. She is scheduled for surgery by urology coming up later this month.  - Outpatient urology follow-up  5. History of pulmonary  embolism-continue Eliquis.  6. Essential hypertension-continue Norvasc, hydrochlorothiazide  7. Diabetes type 2 without complication- no further hypoglycemia.   8. Hyperlipidemia-continue atorvastatin  DISCHARGE CONDITIONS:  stable CONSULTS OBTAINED:  Treatment Team:  Teodoro Spray, MD DRUG ALLERGIES:  No Known Allergies DISCHARGE MEDICATIONS:   Allergies as of 07/18/2016   No Known Allergies     Medication List    STOP taking these medications   cefdinir 300 MG capsule Commonly known as:  OMNICEF   oxyCODONE-acetaminophen 7.5-325 MG tablet Commonly known as:  PERCOCET     TAKE these medications   amLODipine 5 MG tablet Commonly known as:  NORVASC Take 5 mg by mouth daily.   apixaban 5 MG Tabs tablet Commonly known as:  ELIQUIS Take 1 tablet (5 mg total) by mouth 2 (two) times daily. What changed:  how much to take   atorvastatin 20 MG tablet Commonly known as:  LIPITOR Take 20 mg by mouth at bedtime.   diphenhydrAMINE 25 MG tablet Commonly known as:  BENADRYL Take 25 mg by mouth daily as needed for itching or allergies.   feeding supplement (PRO-STAT SUGAR FREE 64) Liqd Take 30 mLs by mouth 3 (three) times daily.   ferrous sulfate 325 (65 FE) MG tablet Take 1 tablet (325 mg total) by mouth daily with breakfast.   gabapentin 300 MG capsule Commonly known as:  NEURONTIN Take 300 mg by mouth at bedtime.   Insulin Detemir 100 UNIT/ML Pen Commonly known as:  LEVEMIR Inject 8 Units into the skin every morning.   lisinopril-hydrochlorothiazide 20-12.5 MG tablet Commonly known as:  PRINZIDE,ZESTORETIC Take 2 tablets by mouth daily.   magnesium oxide 400 (241.3 Mg) MG  tablet Commonly known as:  MAG-OX Take 1 tablet (400 mg total) by mouth daily.   meloxicam 7.5 MG tablet Commonly known as:  MOBIC Take 7.5 mg by mouth daily.   metoCLOPramide 5 MG tablet Commonly known as:  REGLAN Take 1 tablet (5 mg total) by mouth 2 (two) times daily.     multivitamin with minerals Tabs tablet Take 1 tablet by mouth daily.   ondansetron 4 MG disintegrating tablet Commonly known as:  ZOFRAN-ODT Take 4 mg by mouth every 6 (six) hours as needed for nausea or vomiting.   pantoprazole 40 MG tablet Commonly known as:  PROTONIX Take 1 tablet (40 mg total) by mouth 2 (two) times daily. What changed:  when to take this   polyethylene glycol packet Commonly known as:  MIRALAX Take 17 g by mouth daily as needed.   potassium chloride SA 20 MEQ tablet Commonly known as:  K-DUR,KLOR-CON Take 1 tablet (20 mEq total) by mouth daily.   sertraline 25 MG tablet Commonly known as:  ZOLOFT Take 1 tablet (25 mg total) by mouth daily.   sucralfate 1 GM/10ML suspension Commonly known as:  CARAFATE Take 10 mLs (1 g total) by mouth 4 (four) times daily -  with meals and at bedtime.   tamsulosin 0.4 MG Caps capsule Commonly known as:  FLOMAX Take 1 capsule (0.4 mg total) by mouth daily. What changed:  when to take this   TRESIBA FLEXTOUCH 100 UNIT/ML Sopn FlexTouch Pen Generic drug:  insulin degludec Inject 10 Units into the skin every morning.        DISCHARGE INSTRUCTIONS:   DIET:  Regular diet DISCHARGE CONDITION:  Good ACTIVITY:  Activity as tolerated OXYGEN:  Home Oxygen: No.  Oxygen Delivery: room air DISCHARGE LOCATION:  home - palliative care to follow  If you experience worsening of your admission symptoms, develop shortness of breath, life threatening emergency, suicidal or homicidal thoughts you must seek medical attention immediately by calling 911 or calling your MD immediately  if symptoms less severe.  You Must read complete instructions/literature along with all the possible adverse reactions/side effects for all the Medicines you take and that have been prescribed to you. Take any new Medicines after you have completely understood and accpet all the possible adverse reactions/side effects.   Please note  You were  cared for by a hospitalist during your hospital stay. If you have any questions about your discharge medications or the care you received while you were in the hospital after you are discharged, you can call the unit and asked to speak with the hospitalist on call if the hospitalist that took care of you is not available. Once you are discharged, your primary care physician will handle any further medical issues. Please note that NO REFILLS for any discharge medications will be authorized once you are discharged, as it is imperative that you return to your primary care physician (or establish a relationship with a primary care physician if you do not have one) for your aftercare needs so that they can reassess your need for medications and monitor your lab values.    On the day of Discharge:  VITAL SIGNS:  Blood pressure 115/79, pulse 88, temperature 97.7 F (36.5 C), temperature source Oral, resp. rate 14, height 5\' 4"  (1.626 m), weight 54.9 kg (121 lb), SpO2 98 %. PHYSICAL EXAMINATION:  GENERAL:  83 y.o.-year-old patient lying in the bed with no acute distress.  EYES: Pupils equal, round, reactive to light and  accommodation. No scleral icterus. Extraocular muscles intact.  HEENT: Head atraumatic, normocephalic. Oropharynx and nasopharynx clear.  NECK:  Supple, no jugular venous distention. No thyroid enlargement, no tenderness.  LUNGS: Normal breath sounds bilaterally, no wheezing, rales,rhonchi or crepitation. No use of accessory muscles of respiration.  CARDIOVASCULAR: S1, S2 normal. No murmurs, rubs, or gallops.  ABDOMEN: Soft, non-tender, non-distended. Bowel sounds present. No organomegaly or mass.  EXTREMITIES: No pedal edema, cyanosis, or clubbing.  NEUROLOGIC: Cranial nerves II through XII are intact. Muscle strength 5/5 in all extremities. Sensation intact. Gait not checked.  PSYCHIATRIC: The patient is alert and oriented x 3.  SKIN: No obvious rash, lesion, or ulcer.  DATA REVIEW:    CBC  Recent Labs Lab 07/18/16 0513  WBC 4.7  HGB 9.7*  HCT 28.8*  PLT 158    Chemistries   Recent Labs Lab 07/15/16 0513  07/18/16 0513  NA 143  --  135  K 2.9*  < > 3.3*  CL 111  --  100*  CO2 28  --  31  GLUCOSE 46*  --  124*  BUN 10  --  14  CREATININE 0.69  --  1.08*  CALCIUM 7.9*  --  8.0*  MG 2.3  --   --   < > = values in this interval not displayed.   Follow-up [place], [facility]. Schedule an appointment as soon as possible for a visit in 1 week(s).   Specialty:  General Practice Contact information: [place] [address] [phone]        Jonathon Bellows, MD. Schedule an appointment as soon as possible for a visit in 2 week(s).   Specialty:  Surgery Contact information: [place] [address] [phone]        Teodoro Spray, MD. Schedule an appointment as soon as possible for a visit in 3 week(s).   Specialty:  Cardiology Contact information: [place] [address] [phone]        [facility] Follow up.   Specialty:  [place] Why:  nurse, physical therapy, aide , social work, occupational therapy Contact information: [place] [address] [phone]           Management plans discussed with the patient, family and they are in agreement.  CODE STATUS: Prior   Very high risk for readmissions  TOTAL TIME TAKING CARE OF THIS PATIENT: 45 minutes.    Max Sane M.D on 07/20/2016 at 10:27 PM  Between 7am to 6pm - Pager - [phone]  After 6pm go to [email or web address] - [employer] [facility]  Office  [phone]  CC: Primary care physician; [facility]   Note: This dictation was prepared with Dragon dictation along with smaller phrase technology. Any transcriptional errors that  result from this process are unintentional.

## 2016-07-21 NOTE — Telephone Encounter (Signed)
LMOM. Pt needs to reschedule missed pre-admit testing appt d/t hospitalization.

## 2016-07-22 ENCOUNTER — Other Ambulatory Visit: Payer: Self-pay

## 2016-07-22 ENCOUNTER — Inpatient Hospital Stay
Admission: EM | Admit: 2016-07-22 | Discharge: 2016-07-24 | DRG: 309 | Disposition: A | Discharge status: Home Health | Payer: Medicare Other | Attending: Internal Medicine | Admitting: Internal Medicine

## 2016-07-22 ENCOUNTER — Emergency Department: Payer: Medicare Other

## 2016-07-22 ENCOUNTER — Encounter: Payer: Self-pay | Admitting: Emergency Medicine

## 2016-07-22 DIAGNOSIS — F329 Major depressive disorder, single episode, unspecified: Secondary | ICD-10-CM | POA: Diagnosis present

## 2016-07-22 DIAGNOSIS — I472 Ventricular tachycardia: Secondary | ICD-10-CM | POA: Diagnosis present

## 2016-07-22 DIAGNOSIS — A084 Viral intestinal infection, unspecified: Secondary | ICD-10-CM | POA: Diagnosis not present

## 2016-07-22 DIAGNOSIS — E782 Mixed hyperlipidemia: Secondary | ICD-10-CM | POA: Diagnosis present

## 2016-07-22 DIAGNOSIS — E876 Hypokalemia: Secondary | ICD-10-CM | POA: Diagnosis present

## 2016-07-22 DIAGNOSIS — I499 Cardiac arrhythmia, unspecified: Secondary | ICD-10-CM

## 2016-07-22 DIAGNOSIS — E1142 Type 2 diabetes mellitus with diabetic polyneuropathy: Secondary | ICD-10-CM | POA: Diagnosis present

## 2016-07-22 DIAGNOSIS — Z7901 Long term (current) use of anticoagulants: Secondary | ICD-10-CM | POA: Diagnosis not present

## 2016-07-22 DIAGNOSIS — I10 Essential (primary) hypertension: Secondary | ICD-10-CM | POA: Diagnosis not present

## 2016-07-22 DIAGNOSIS — R778 Other specified abnormalities of plasma proteins: Secondary | ICD-10-CM

## 2016-07-22 DIAGNOSIS — I480 Paroxysmal atrial fibrillation: Secondary | ICD-10-CM | POA: Diagnosis not present

## 2016-07-22 DIAGNOSIS — R079 Chest pain, unspecified: Secondary | ICD-10-CM

## 2016-07-22 DIAGNOSIS — Z833 Family history of diabetes mellitus: Secondary | ICD-10-CM

## 2016-07-22 DIAGNOSIS — I2782 Chronic pulmonary embolism: Secondary | ICD-10-CM | POA: Diagnosis not present

## 2016-07-22 DIAGNOSIS — R7989 Other specified abnormal findings of blood chemistry: Secondary | ICD-10-CM

## 2016-07-22 DIAGNOSIS — Z9981 Dependence on supplemental oxygen: Secondary | ICD-10-CM

## 2016-07-22 DIAGNOSIS — I248 Other forms of acute ischemic heart disease: Secondary | ICD-10-CM | POA: Diagnosis present

## 2016-07-22 DIAGNOSIS — R112 Nausea with vomiting, unspecified: Secondary | ICD-10-CM

## 2016-07-22 DIAGNOSIS — I4891 Unspecified atrial fibrillation: Secondary | ICD-10-CM | POA: Diagnosis present

## 2016-07-22 LAB — COMPREHENSIVE METABOLIC PANEL
ALK PHOS: 58 U/L (ref 38–126)
ALT: 8 U/L — AB (ref 14–54)
ANION GAP: 12 (ref 5–15)
AST: 19 U/L (ref 15–41)
Albumin: 2.9 g/dL — ABNORMAL LOW (ref 3.5–5.0)
BUN: 10 mg/dL (ref 6–20)
CALCIUM: 8.6 mg/dL — AB (ref 8.9–10.3)
CHLORIDE: 96 mmol/L — AB (ref 101–111)
CO2: 28 mmol/L (ref 22–32)
CREATININE: 0.86 mg/dL (ref 0.44–1.00)
GFR calc non Af Amer: 59 mL/min — ABNORMAL LOW (ref >60)
Glucose, Bld: 122 mg/dL — ABNORMAL HIGH (ref 65–99)
Potassium: 2.8 mmol/L — ABNORMAL LOW (ref 3.5–5.1)
Sodium: 136 mmol/L (ref 135–145)
Total Bilirubin: 0.8 mg/dL (ref 0.3–1.2)
Total Protein: 5.6 g/dL — ABNORMAL LOW (ref 6.5–8.1)

## 2016-07-22 LAB — CBC WITH DIFFERENTIAL/PLATELET
Basophils Absolute: 0.1 10*3/uL (ref 0–0.1)
Basophils Relative: 1 %
EOS PCT: 0 %
Eosinophils Absolute: 0 10*3/uL (ref 0–0.7)
HCT: 39.7 % (ref 35.0–47.0)
Hemoglobin: 13.4 g/dL (ref 12.0–16.0)
LYMPHS ABS: 3.4 10*3/uL (ref 1.0–3.6)
LYMPHS PCT: 42 %
MCH: 27.4 pg (ref 26.0–34.0)
MCHC: 33.8 g/dL (ref 32.0–36.0)
MCV: 81.1 fL (ref 80.0–100.0)
MONOS PCT: 8 %
Monocytes Absolute: 0.6 10*3/uL (ref 0.2–0.9)
Neutro Abs: 4 10*3/uL (ref 1.4–6.5)
Neutrophils Relative %: 49 %
PLATELETS: 358 10*3/uL (ref 150–440)
RBC: 4.89 MIL/uL (ref 3.80–5.20)
RDW: 14.7 % — ABNORMAL HIGH (ref 11.5–14.5)
WBC: 8.1 10*3/uL (ref 3.6–11.0)

## 2016-07-22 LAB — APTT: aPTT: 31 seconds (ref 24–36)

## 2016-07-22 LAB — GLUCOSE, CAPILLARY
Glucose-Capillary: 130 mg/dL — ABNORMAL HIGH (ref 65–99)
Glucose-Capillary: 90 mg/dL (ref 65–99)
Glucose-Capillary: 73 mg/dL (ref 65–99)
Glucose-Capillary: 113 mg/dL — ABNORMAL HIGH (ref 65–99)

## 2016-07-22 LAB — TROPONIN I
Troponin I: 0.03 ng/mL (ref <0.03)
Troponin I: 0.03 ng/mL (ref <0.03)
Troponin I: 0.07 ng/mL (ref <0.03)
Troponin I: 0.16 ng/mL (ref <0.03)

## 2016-07-22 LAB — POTASSIUM: POTASSIUM: 4.5 mmol/L (ref 3.5–5.1)

## 2016-07-22 LAB — MAGNESIUM: Magnesium: 3.2 mg/dL — ABNORMAL HIGH (ref 1.7–2.4)

## 2016-07-22 LAB — PROTIME-INR
INR: 0.97
PROTHROMBIN TIME: 12.9 s (ref 11.4–15.2)

## 2016-07-22 LAB — TSH: TSH: 2.932 u[IU]/mL (ref 0.350–4.500)

## 2016-07-22 MED ORDER — PANTOPRAZOLE SODIUM 40 MG PO TBEC
40.0000 mg | DELAYED_RELEASE_TABLET | Freq: Two times a day (BID) | ORAL | Status: DC
  Administered 2016-07-22 – 2016-07-24 (×5): 40 mg via ORAL
  Filled 2016-07-22 (×5): qty 1

## 2016-07-22 MED ORDER — SERTRALINE HCL 50 MG PO TABS
25.0000 mg | ORAL_TABLET | Freq: Every day | ORAL | Status: DC
  Administered 2016-07-22 – 2016-07-24 (×3): 25 mg via ORAL
  Filled 2016-07-22 (×3): qty 1

## 2016-07-22 MED ORDER — ATORVASTATIN CALCIUM 20 MG PO TABS
20.0000 mg | ORAL_TABLET | Freq: Every day | ORAL | Status: DC
  Administered 2016-07-22 – 2016-07-23 (×2): 20 mg via ORAL
  Filled 2016-07-22 (×2): qty 1

## 2016-07-22 MED ORDER — ONDANSETRON HCL 4 MG/2ML IJ SOLN
INTRAMUSCULAR | Status: AC
  Filled 2016-07-22: qty 2

## 2016-07-22 MED ORDER — AMLODIPINE BESYLATE 5 MG PO TABS
5.0000 mg | ORAL_TABLET | Freq: Every day | ORAL | Status: DC
  Administered 2016-07-22 – 2016-07-24 (×3): 5 mg via ORAL
  Filled 2016-07-22 (×3): qty 1

## 2016-07-22 MED ORDER — AMIODARONE HCL IN DEXTROSE 360-4.14 MG/200ML-% IV SOLN
60.0000 mg/h | INTRAVENOUS | Status: AC
  Administered 2016-07-22: 60 mg/h via INTRAVENOUS

## 2016-07-22 MED ORDER — LISINOPRIL-HYDROCHLOROTHIAZIDE 20-12.5 MG PO TABS: 2.0000 | ORAL_TABLET | Freq: Every day | ORAL | Status: DC

## 2016-07-22 MED ORDER — ACETAMINOPHEN 325 MG PO TABS: 650.0000 mg | ORAL_TABLET | Freq: Four times a day (QID) | ORAL | Status: DC | PRN

## 2016-07-22 MED ORDER — INSULIN ASPART 100 UNIT/ML ~~LOC~~ SOLN
0.0000 [IU] | Freq: Three times a day (TID) | SUBCUTANEOUS | Status: DC
  Administered 2016-07-22: 1 [IU] via SUBCUTANEOUS
  Administered 2016-07-23: 2 [IU] via SUBCUTANEOUS
  Administered 2016-07-23 – 2016-07-24 (×3): 1 [IU] via SUBCUTANEOUS
  Filled 2016-07-22 (×5): qty 1

## 2016-07-22 MED ORDER — PRO-STAT SUGAR FREE PO LIQD
30.0000 mL | Freq: Three times a day (TID) | ORAL | Status: DC
  Administered 2016-07-23: 30 mL via ORAL

## 2016-07-22 MED ORDER — MAGNESIUM SULFATE 2 GM/50ML IV SOLN
2.0000 g | Freq: Once | INTRAVENOUS | Status: AC
  Administered 2016-07-22: 2 g via INTRAVENOUS

## 2016-07-22 MED ORDER — ADULT MULTIVITAMIN W/MINERALS CH
1.0000 | ORAL_TABLET | Freq: Every day | ORAL | Status: DC
  Administered 2016-07-22 – 2016-07-24 (×3): 1 via ORAL
  Filled 2016-07-22 (×3): qty 1

## 2016-07-22 MED ORDER — ACETAMINOPHEN 650 MG RE SUPP: 650.0000 mg | Freq: Four times a day (QID) | RECTAL | Status: DC | PRN

## 2016-07-22 MED ORDER — TAMSULOSIN HCL 0.4 MG PO CAPS
0.4000 mg | ORAL_CAPSULE | Freq: Every day | ORAL | Status: DC
  Administered 2016-07-22 – 2016-07-24 (×3): 0.4 mg via ORAL
  Filled 2016-07-22 (×3): qty 1

## 2016-07-22 MED ORDER — AMIODARONE HCL IN DEXTROSE 360-4.14 MG/200ML-% IV SOLN
30.0000 mg/h | INTRAVENOUS | Status: DC
  Administered 2016-07-22 (×2): 30 mg/h via INTRAVENOUS
  Filled 2016-07-22 (×2): qty 200

## 2016-07-22 MED ORDER — MORPHINE SULFATE (PF) 4 MG/ML IV SOLN
INTRAVENOUS | Status: AC
  Filled 2016-07-22: qty 1

## 2016-07-22 MED ORDER — MORPHINE SULFATE (PF) 4 MG/ML IV SOLN
4.0000 mg | Freq: Once | INTRAVENOUS | Status: AC
  Administered 2016-07-22: 4 mg via INTRAVENOUS

## 2016-07-22 MED ORDER — SODIUM CHLORIDE 0.9 % IV SOLN
Freq: Once | INTRAVENOUS | Status: AC
  Administered 2016-07-22: 01:00:00 via INTRAVENOUS

## 2016-07-22 MED ORDER — METOCLOPRAMIDE HCL 5 MG PO TABS
5.0000 mg | ORAL_TABLET | Freq: Two times a day (BID) | ORAL | Status: DC
  Administered 2016-07-22 – 2016-07-24 (×5): 5 mg via ORAL
  Filled 2016-07-22 (×5): qty 1

## 2016-07-22 MED ORDER — AMIODARONE IV BOLUS ONLY 150 MG/100ML
150.0000 mg | Freq: Once | INTRAVENOUS | Status: AC
  Administered 2016-07-22: 150 mg via INTRAVENOUS
  Filled 2016-07-22: qty 100

## 2016-07-22 MED ORDER — ORAL CARE MOUTH RINSE
15.0000 mL | Freq: Two times a day (BID) | OROMUCOSAL | Status: DC
  Administered 2016-07-22 – 2016-07-23 (×2): 15 mL via OROMUCOSAL

## 2016-07-22 MED ORDER — POTASSIUM CHLORIDE 10 MEQ/100ML IV SOLN
10.0000 meq | INTRAVENOUS | Status: AC
  Administered 2016-07-22 (×6): 10 meq via INTRAVENOUS
  Filled 2016-07-22 (×7): qty 100

## 2016-07-22 MED ORDER — ONDANSETRON HCL 4 MG/2ML IJ SOLN: 4.0000 mg | Freq: Four times a day (QID) | INTRAMUSCULAR | Status: DC | PRN

## 2016-07-22 MED ORDER — ONDANSETRON HCL 4 MG PO TABS: 4.0000 mg | ORAL_TABLET | Freq: Four times a day (QID) | ORAL | Status: DC | PRN

## 2016-07-22 MED ORDER — DOCUSATE SODIUM 100 MG PO CAPS
100.0000 mg | ORAL_CAPSULE | Freq: Two times a day (BID) | ORAL | Status: DC
  Administered 2016-07-22 – 2016-07-24 (×5): 100 mg via ORAL
  Filled 2016-07-22 (×5): qty 1

## 2016-07-22 MED ORDER — SUCRALFATE 1 GM/10ML PO SUSP
1.0000 g | Freq: Three times a day (TID) | ORAL | Status: DC
  Administered 2016-07-22 – 2016-07-24 (×10): 1 g via ORAL
  Filled 2016-07-22 (×12): qty 10

## 2016-07-22 MED ORDER — ONDANSETRON HCL 4 MG/2ML IJ SOLN
4.0000 mg | INTRAMUSCULAR | Status: AC
  Administered 2016-07-22: 4 mg via INTRAVENOUS

## 2016-07-22 MED ORDER — HYDROCHLOROTHIAZIDE 12.5 MG PO CAPS: 12.5000 mg | ORAL_CAPSULE | Freq: Every day | ORAL | Status: DC

## 2016-07-22 MED ORDER — INSULIN DETEMIR 100 UNIT/ML ~~LOC~~ SOLN
4.0000 [IU] | Freq: Every day | SUBCUTANEOUS | Status: DC
  Administered 2016-07-22 – 2016-07-23 (×2): 4 [IU] via SUBCUTANEOUS
  Filled 2016-07-22 (×3): qty 0.04

## 2016-07-22 MED ORDER — APIXABAN 2.5 MG PO TABS
2.5000 mg | ORAL_TABLET | Freq: Two times a day (BID) | ORAL | Status: DC
  Administered 2016-07-22 – 2016-07-24 (×5): 2.5 mg via ORAL
  Filled 2016-07-22 (×5): qty 1

## 2016-07-22 MED ORDER — FERROUS SULFATE 325 (65 FE) MG PO TABS
325.0000 mg | ORAL_TABLET | Freq: Every day | ORAL | Status: DC
  Administered 2016-07-22 – 2016-07-24 (×3): 325 mg via ORAL
  Filled 2016-07-22 (×3): qty 1

## 2016-07-22 MED ORDER — GABAPENTIN 300 MG PO CAPS
300.0000 mg | ORAL_CAPSULE | Freq: Every day | ORAL | Status: DC
  Administered 2016-07-22 – 2016-07-23 (×2): 300 mg via ORAL
  Filled 2016-07-22 (×2): qty 1

## 2016-07-22 MED ORDER — MAGNESIUM OXIDE 400 (241.3 MG) MG PO TABS
400.0000 mg | ORAL_TABLET | Freq: Every day | ORAL | Status: DC
  Administered 2016-07-22 – 2016-07-24 (×3): 400 mg via ORAL
  Filled 2016-07-22 (×3): qty 1

## 2016-07-22 MED ORDER — PROCHLORPERAZINE EDISYLATE 5 MG/ML IJ SOLN
10.0000 mg | Freq: Four times a day (QID) | INTRAMUSCULAR | Status: DC | PRN
  Administered 2016-07-22: 10 mg via INTRAVENOUS
  Filled 2016-07-22 (×2): qty 2

## 2016-07-22 MED ORDER — POTASSIUM CHLORIDE CRYS ER 20 MEQ PO TBCR
20.0000 meq | EXTENDED_RELEASE_TABLET | Freq: Every day | ORAL | Status: DC
  Administered 2016-07-22 – 2016-07-23 (×2): 20 meq via ORAL
  Filled 2016-07-22 (×2): qty 1

## 2016-07-22 MED ORDER — POLYETHYLENE GLYCOL 3350 17 G PO PACK: 17.0000 g | PACK | Freq: Every day | ORAL | Status: DC | PRN

## 2016-07-22 MED ORDER — DIPHENHYDRAMINE HCL 25 MG PO CAPS: 25.0000 mg | ORAL_CAPSULE | Freq: Every day | ORAL | Status: DC | PRN

## 2016-07-22 MED ORDER — LISINOPRIL 20 MG PO TABS
20.0000 mg | ORAL_TABLET | Freq: Every day | ORAL | Status: DC
  Administered 2016-07-22 – 2016-07-24 (×3): 20 mg via ORAL
  Filled 2016-07-22 (×3): qty 1

## 2016-07-22 MED FILL — Medication: Qty: 1 | Status: AC

## 2016-07-22 NOTE — ED Notes (Signed)
Pt with brown emesis. Pt cleansed, sheets changed. Pt states nausea is better after vomiting. Pt reports improvement in chest pain after morphine administration.

## 2016-07-22 NOTE — H&P (Addendum)
Sherry [name] is an 83 y.o. female.   Chief Complaint: Chest pain HPI: The patient with past medical history of pulmonary embolism currently on Eliquis, hypertension and diabetes presents to the emergency department complaining of chest pain. Her pain began earlier this evening after acute onset of nausea and vomiting. The patient admits to palpitations at the onset of symptoms. She called EMS who found the patient to have atrial fibrillation with rapid ventricular rate. At sometime en route to the hospital the patient became pale and had an episode of nonsustained V. tach. She did not require cardioversion. The patient was placed on amiodarone which slowed her rate. She was found to have hypokalemia in the emergency department as well. When she was stabilized emergency department staff called the hospitalist service for admission.  Past Medical History:  Diagnosis Date  . Diabetes mellitus without complication ([place])   . Hyperlipidemia   . Hypertension   . Pulmonary embolism [facility])     Past Surgical History:  Procedure Laterality Date  . APPENDECTOMY    . BACK SURGERY    . ESOPHAGOGASTRODUODENOSCOPY N/A 05/29/2016   Procedure: ESOPHAGOGASTRODUODENOSCOPY (EGD);  Surgeon: Clarene Essex, MD;  Location: [facility];  Service: Gastroenterology;  Laterality: N/A;  . SHOULDER SURGERY      Family History  Problem Relation Age of Onset  . Diabetes Mother   . Cancer Mother   . Cancer Father   . Bladder Cancer Neg Hx   . Kidney cancer Neg Hx    Social History:  reports that she has never smoked. She has never used smokeless tobacco. She reports that she does not drink alcohol or use drugs.  Allergies: No Known Allergies  Medications Prior to Admission  Medication Sig Dispense Refill  . amLODipine (NORVASC) 5 MG tablet Take 5 mg by mouth daily.    Marland Kitchen apixaban (ELIQUIS) 5 MG TABS tablet Take 1 tablet (5 mg total) by mouth 2 (two) times daily. (Patient taking differently: Take 2.5 mg by mouth 2  (two) times daily. ) 60 tablet 3  . atorvastatin (LIPITOR) 20 MG tablet Take 20 mg by mouth at bedtime.     . diphenhydrAMINE (BENADRYL) 25 MG tablet Take 25 mg by mouth daily as needed for itching or allergies.    . ferrous sulfate 325 (65 FE) MG tablet Take 1 tablet (325 mg total) by mouth daily with breakfast. 60 tablet 3  . gabapentin (NEURONTIN) 300 MG capsule Take 300 mg by mouth at bedtime.     . insulin degludec (TRESIBA FLEXTOUCH) 100 UNIT/ML SOPN FlexTouch Pen Inject 10 Units into the skin every morning.    . Insulin Detemir (LEVEMIR) 100 UNIT/ML Pen Inject 8 Units into the skin every morning. 15 mL 0  . lisinopril-hydrochlorothiazide (PRINZIDE,ZESTORETIC) 20-12.5 MG tablet Take 2 tablets by mouth daily.     . magnesium oxide (MAG-OX) 400 (241.3 Mg) MG tablet Take 1 tablet (400 mg total) by mouth daily. 30 tablet 0  . meloxicam (MOBIC) 7.5 MG tablet Take 7.5 mg by mouth daily.    . metoCLOPramide (REGLAN) 5 MG tablet Take 1 tablet (5 mg total) by mouth 2 (two) times daily. 60 tablet 2  . Multiple Vitamin (MULTIVITAMIN WITH MINERALS) TABS tablet Take 1 tablet by mouth daily. 30 tablet 2  . ondansetron (ZOFRAN-ODT) 4 MG disintegrating tablet Take 4 mg by mouth every 6 (six) hours as needed for nausea or vomiting.    . pantoprazole (PROTONIX) 40 MG tablet Take 1 tablet (40 mg  total) by mouth 2 (two) times daily. (Patient taking differently: Take 40 mg by mouth daily. ) 60 tablet 0  . sertraline (ZOLOFT) 25 MG tablet Take 1 tablet (25 mg total) by mouth daily. 30 tablet 2  . sucralfate (CARAFATE) 1 GM/10ML suspension Take 10 mLs (1 g total) by mouth 4 (four) times daily -  with meals and at bedtime. 420 mL 0  . tamsulosin (FLOMAX) 0.4 MG CAPS capsule Take 1 capsule (0.4 mg total) by mouth daily. (Patient taking differently: Take 0.4 mg by mouth daily after lunch. ) 30 capsule 0  . Amino Acids-Protein Hydrolys (FEEDING SUPPLEMENT, PRO-STAT SUGAR FREE 64,) LIQD Take 30 mLs by mouth 3 (three)  times daily. (Patient not taking: Reported on 07/05/2016) 900 mL 2  . polyethylene glycol (MIRALAX) packet Take 17 g by mouth daily as needed. 30 each 0    Results for orders placed or performed during the hospital encounter of 07/22/16 (from the past 48 hour(s))  CBC with Differential     Status: Abnormal   Collection Time: 07/22/16 12:17 AM  Result Value Ref Range   WBC 8.1 3.6 - 11.0 K/uL   RBC 4.89 3.80 - 5.20 MIL/uL   Hemoglobin 13.4 12.0 - 16.0 g/dL   HCT 39.7 35.0 - 47.0 %   MCV 81.1 80.0 - 100.0 fL   MCH 27.4 26.0 - 34.0 pg   MCHC 33.8 32.0 - 36.0 g/dL   RDW 14.7 (H) 11.5 - 14.5 %   Platelets 358 150 - 440 K/uL   Neutrophils Relative % 49 %   Neutro Abs 4.0 1.4 - 6.5 K/uL   Lymphocytes Relative 42 %   Lymphs Abs 3.4 1.0 - 3.6 K/uL   Monocytes Relative 8 %   Monocytes Absolute 0.6 0.2 - 0.9 K/uL   Eosinophils Relative 0 %   Eosinophils Absolute 0.0 0 - 0.7 K/uL   Basophils Relative 1 %   Basophils Absolute 0.1 0 - 0.1 K/uL  Comprehensive metabolic panel     Status: Abnormal   Collection Time: 07/22/16 12:34 AM  Result Value Ref Range   Sodium 136 135 - 145 mmol/L   Potassium 2.8 (L) 3.5 - 5.1 mmol/L   Chloride 96 (L) 101 - 111 mmol/L   CO2 28 22 - 32 mmol/L   Glucose, Bld 122 (H) 65 - 99 mg/dL   BUN 10 6 - 20 mg/dL   Creatinine, Ser 0.86 0.44 - 1.00 mg/dL   Calcium 8.6 (L) 8.9 - 10.3 mg/dL   Total Protein 5.6 (L) 6.5 - 8.1 g/dL   Albumin 2.9 (L) 3.5 - 5.0 g/dL   AST 19 15 - 41 U/L   ALT 8 (L) 14 - 54 U/L   Alkaline Phosphatase 58 38 - 126 U/L   Total Bilirubin 0.8 0.3 - 1.2 mg/dL   GFR calc non Af Amer 59 (L) >60 mL/min   GFR calc Af Amer >60 >60 mL/min    Comment: (NOTE) The eGFR has been calculated using the CKD EPI equation. This calculation has not been validated in all clinical situations. eGFR's persistently <60 mL/min signify possible Chronic Kidney Disease.    Anion gap 12 5 - 15  Magnesium     Status: Abnormal   Collection Time: 07/22/16 12:34 AM   Result Value Ref Range   Magnesium 3.2 (H) 1.7 - 2.4 mg/dL  Protime-INR     Status: None   Collection Time: 07/22/16 12:34 AM  Result Value Ref Range  Prothrombin Time 12.9 11.4 - 15.2 seconds   INR 0.97   APTT     Status: None   Collection Time: 07/22/16 12:34 AM  Result Value Ref Range   aPTT 31 24 - 36 seconds  Troponin I     Status: Abnormal   Collection Time: 07/22/16 12:34 AM  Result Value Ref Range   Troponin I 0.03 (HH) <0.03 ng/mL    Comment: CRITICAL RESULT CALLED TO, READ BACK BY AND VERIFIED WITH APRIL BRUMGARD AT 0104 07/22/16.PMH  Troponin I     Status: Abnormal   Collection Time: 07/22/16  4:48 AM  Result Value Ref Range   Troponin I 0.03 (HH) <0.03 ng/mL    Comment: CRITICAL VALUE NOTED. VALUE IS CONSISTENT WITH PREVIOUSLY REPORTED/CALLED VALUE.PMH  TSH     Status: None   Collection Time: 07/22/16  4:48 AM  Result Value Ref Range   TSH 2.932 0.350 - 4.500 uIU/mL    Comment: Performed by a 3rd Generation assay with a functional sensitivity of <=0.01 uIU/mL.   Dg Chest 1 View  Result Date: 07/22/2016 CLINICAL DATA:  Unresponsive, chest pain EXAM: CHEST 1 VIEW COMPARISON:  07/12/2016 FINDINGS: Lower cervical hardware. Multiple skin folds over the left chest. No focal consolidation or pleural effusion. Stable cardiomediastinal silhouette. No pneumothorax. Widened right AC joint IMPRESSION: No radiographic evidence for acute cardiopulmonary abnormality. Electronically Signed   By: Donavan Foil M.D.   On: 07/22/2016 00:53    Review of Systems  Constitutional: Negative for chills and fever.  HENT: Negative for sore throat and tinnitus.   Eyes: Negative for blurred vision and redness.  Respiratory: Negative for cough and shortness of breath.   Cardiovascular: Positive for chest pain (resolved) and palpitations. Negative for orthopnea and PND.  Gastrointestinal: Positive for nausea and vomiting. Negative for abdominal pain and diarrhea.  Genitourinary: Negative for  dysuria, frequency and urgency.  Musculoskeletal: Negative for joint pain and myalgias.  Skin: Negative for rash.       No lesions  Neurological: Negative for speech change, focal weakness and weakness.  Endo/Heme/Allergies: Does not bruise/bleed easily.       No temperature intolerance  Psychiatric/Behavioral: Negative for depression and suicidal ideas.    Blood pressure (!) 176/61, pulse 76, temperature (!) 97.5 F (36.4 C), temperature source Oral, resp. rate 18, height 5' 4"  (1.626 m), weight 56 kg (123 lb 6.4 oz), SpO2 100 %. Physical Exam  Vitals reviewed. Constitutional: She is oriented to person, place, and time. She appears well-developed and well-nourished. No distress.  HENT:  Head: Normocephalic and atraumatic.  Mouth/Throat: Oropharynx is clear and moist.  Eyes: Pupils are equal, round, and reactive to light. Conjunctivae and EOM are normal. No scleral icterus.  Neck: Normal range of motion. Neck supple. No JVD present. No tracheal deviation present. No thyromegaly present.  Cardiovascular: Normal rate, regular rhythm and normal heart sounds.  Exam reveals no gallop and no friction rub.   No murmur heard. Respiratory: Effort normal and breath sounds normal.  GI: Soft. Bowel sounds are normal. She exhibits no distension. There is no tenderness.  Genitourinary:  Genitourinary Comments: Deferred  Musculoskeletal: Normal range of motion. She exhibits no edema.  Lymphadenopathy:    She has no cervical adenopathy.  Neurological: She is alert and oriented to person, place, and time. No cranial nerve deficit. She exhibits normal muscle tone.  Skin: Skin is warm and dry. No rash noted. No erythema.  Psychiatric: She has a normal mood and affect.  Her behavior is normal. Judgment and thought content normal.     Assessment/Plan This is a 83 year old female admitted for atrial fibrillation with rapid ventricular rate. 1. Atrial fibrillation: Rapid ventricular rate resolved.  Continue amiodarone. She is on full dose anticoagulation. Chest pain has resolved. Continue to follow cardiac biomarkers and monitor telemetry. Cardiology has been consulted. 2. Hypokalemia: Replete potassium. Magnesium also supplemented. Monitor telemetry. 3. Nausea and vomiting: Unclear how long the symptoms preceded the chest pain. The patient states that she has been feeling sick for some time now. She has been given Zofran without much relief. Compazine ordered for refractory nausea and vomiting. This may be chronic as she has on Reglan at home. She has not had diarrhea. Differential diagnosis includes gastritis, reflux, gastroparesis or adrenergic surge secondary to persistent tachycardia. Check TSH. 4. Diabetes mellitus type 2: Continue basal insulin adjusted for hospital diet. I have placed the patient on sliding scale while hospitalized. Gabapentin for neuropathy 5. Hyperlipidemia: Continue statin therapy 6. Hypertension: Intermittently controlled. The patient has elevations in her blood pressure during episodes of vomiting. Continue to hydrochlorothiazide and lisinopril. 7. Depression: Continue Zoloft 8. History of pulmonary embolism: Continue Eliquis 9. DVT prophylaxis: As above 10. GI prophylaxis: Continue PPI per home regimen The patient is a full code. Time spent on admission orders and patient care approximately 45 minutes  Harrie Foreman, MD 07/22/2016, 7:07 AM

## 2016-07-22 NOTE — ED Triage Notes (Addendum)
Pt arrives emergency traffic with vtach per ems vs. A fib with rvr. Pt with pale, dry skin. Ems states they were called out for cp and pt was unresponsive during approx 15 sec run of vtach. Pt with what appears to be a wide complex tachycardia on monitor with palpable pulse.pt complains of chest pain and nausea.

## 2016-07-22 NOTE — ED Notes (Signed)
Pt placed on zoll on arrival. pw with rate of 85 currently 2+ pedal and radial pulses palpable.

## 2016-07-22 NOTE — ED Notes (Signed)
Pt cleansed of wet brief.

## 2016-07-22 NOTE — ED Provider Notes (Signed)
[facility] Emergency Department Provider Note  ____________________________________________   First MD Initiated Contact with Patient 07/22/16 0011     (approximate)  I have reviewed the triage vital signs and the nursing notes.   HISTORY  Chief Complaint Chest Pain  Level 5 caveat:  history/ROS limited by acute/critical illness  HPI Sherry [name] is a 83 y.o. female with a history that includes chronic chest pain, diabetes, pulmonary embolism on Eliquis, and recent admission for acute kidney injury in the setting of dehydration as well as hypokalemia (all according to EMR) who presents by EMSwith chest pain, cardiac arrhythmias, and at episode of unresponsiveness.  The patient reportedly had acute onset of vomiting earlier tonight at home and then started complaining of more chest pain.  EMS was called and they found her hypoxemic in the field down in the 70s without oxygen although apparently she does have oxygen at home.  The patient was found to have a rapid and irregular heart rate on the monitor insistent with atrial fibrillation with RVR.  After a period of time on the monitor while EMS was attempting to establish an IV, the patient went briefly unresponsive.  Although she was breathing she did not respond to sternal rub or loud voice.  On the monitor she switched to what appeared to be ventricular tachycardia.  She remained in V. tach for at least a few seconds and then went back to atrial fibrillation with RVR.  She was brought emergently to the ED.  Upon arrival in the emergency department she appeared to be in wide complex tachycardia although with an irregular rate in the 140s to 170s.  She had an elevated blood pressure and was responsive to questions although she was toxic appearing.  See ED course for details   Past Medical History:  Diagnosis Date  . Diabetes mellitus without complication ([place])   . Hyperlipidemia   . Hypertension   . Pulmonary  embolism [facility])     Patient Active Problem List   Diagnosis Date Noted  . Dehydration   . Renal mass   . Chest pain, rule out acute myocardial infarction 07/12/2016  . Reactive depression   . Esophagitis determined by endoscopy 05/31/2016  . Hyperglycemia   . Hypokalemia   . Cystitis   . HH (hiatus hernia)   . Hiatal hernia with gastroesophageal reflux disease without esophagitis   . GI bleed 05/28/2016  . Upper GI bleeding 05/28/2016  . Pressure injury of skin 05/28/2016  . Atrial flutter ([place])   . Gastroenteritis 04/24/2016  . Aphasia 04/18/2016  . Nausea & vomiting 03/27/2016  . Advance care planning   . UTI (urinary tract infection) 03/12/2016  . Chest pain 03/09/2016  . PE (pulmonary thromboembolism) ([place]) 02/26/2016  . Diabetes mellitus type 2, insulin dependent ([place]) 02/26/2016  . Anemia 02/26/2016  . Chronic kidney disease (CKD), stage III (moderate) 02/26/2016  . Essential hypertension 02/26/2016  . Goals of care, counseling/discussion   . Adult failure to thrive syndrome   . Palliative care by specialist   . Sepsis ([place]) 11/26/2014    Past Surgical History:  Procedure Laterality Date  . APPENDECTOMY    . BACK SURGERY    . ESOPHAGOGASTRODUODENOSCOPY N/A 05/29/2016   Procedure: ESOPHAGOGASTRODUODENOSCOPY (EGD);  Surgeon: Clarene Essex, MD;  Location: [place];  Service: Gastroenterology;  Laterality: N/A;  . SHOULDER SURGERY      Prior to Admission medications   Medication Sig Start Date End Date Taking? Authorizing Provider  amLODipine (NORVASC) 5 MG tablet Take 5 mg by mouth daily.   Yes [provider]  apixaban (ELIQUIS) 5 MG TABS tablet Take 1 tablet (5 mg total) by mouth 2 (two) times daily. Patient taking differently: Take 2.5 mg by mouth 2 (two) times daily.  03/08/16  Yes Isaac Bliss, Rayford Halsted, MD  atorvastatin (LIPITOR) 20 MG tablet Take 20 mg by mouth at bedtime.    Yes [provider]  diphenhydrAMINE (BENADRYL) 25 MG tablet  Take 25 mg by mouth daily as needed for itching or allergies.   Yes [provider]  ferrous sulfate 325 (65 FE) MG tablet Take 1 tablet (325 mg total) by mouth daily with breakfast. 06/03/16  Yes Lorella Nimrod, MD  gabapentin (NEURONTIN) 300 MG capsule Take 300 mg by mouth at bedtime.    Yes [provider]  insulin degludec (TRESIBA FLEXTOUCH) 100 UNIT/ML SOPN FlexTouch Pen Inject 10 Units into the skin every morning.   Yes [provider]  Insulin Detemir (LEVEMIR) 100 UNIT/ML Pen Inject 8 Units into the skin every morning. 06/02/16  Yes Lorella Nimrod, MD  lisinopril-hydrochlorothiazide (PRINZIDE,ZESTORETIC) 20-12.5 MG tablet Take 2 tablets by mouth daily.    Yes [provider]  magnesium oxide (MAG-OX) 400 (241.3 Mg) MG tablet Take 1 tablet (400 mg total) by mouth daily. 04/27/16  Yes Wieting, Richard, MD  meloxicam (MOBIC) 7.5 MG tablet Take 7.5 mg by mouth daily.   Yes [provider]  metoCLOPramide (REGLAN) 5 MG tablet Take 1 tablet (5 mg total) by mouth 2 (two) times daily. 06/02/16  Yes Lorella Nimrod, MD  Multiple Vitamin (MULTIVITAMIN WITH MINERALS) TABS tablet Take 1 tablet by mouth daily. 06/02/16  Yes Shela Leff, MD  ondansetron (ZOFRAN-ODT) 4 MG disintegrating tablet Take 4 mg by mouth every 6 (six) hours as needed for nausea or vomiting.   Yes [provider]  pantoprazole (PROTONIX) 40 MG tablet Take 1 tablet (40 mg total) by mouth 2 (two) times daily. Patient taking differently: Take 40 mg by mouth daily.  04/27/16  Yes Wieting, Richard, MD  sertraline (ZOLOFT) 25 MG tablet Take 1 tablet (25 mg total) by mouth daily. 06/02/16  Yes Lorella Nimrod, MD  sucralfate (CARAFATE) 1 GM/10ML suspension Take 10 mLs (1 g total) by mouth 4 (four) times daily -  with meals and at bedtime. 06/02/16  Yes Lorella Nimrod, MD  tamsulosin (FLOMAX) 0.4 MG CAPS capsule Take 1 capsule (0.4 mg total) by mouth daily. Patient taking differently: Take 0.4  mg by mouth daily after lunch.  03/14/16  Yes Fritzi Mandes, MD  Amino Acids-Protein Hydrolys (FEEDING SUPPLEMENT, PRO-STAT SUGAR FREE 64,) LIQD Take 30 mLs by mouth 3 (three) times daily. Patient not taking: Reported on 07/05/2016 06/02/16   Shela Leff, MD  polyethylene glycol [facility]) packet Take 17 g by mouth daily as needed. 04/27/16   Loletha Grayer, MD  potassium chloride (K-DUR,KLOR-CON) 20 MEQ tablet Take 1 tablet (20 mEq total) by mouth daily. Patient not taking: Reported on 07/13/2016 06/02/16   Lorella Nimrod, MD    Allergies Patient has no known allergies.  Family History  Problem Relation Age of Onset  . Diabetes Mother   . Cancer Mother   . Cancer Father   . Bladder Cancer Neg Hx   . Kidney cancer Neg Hx     Social History Social History  Substance Use Topics  . Smoking status: Never Smoker  . Smokeless tobacco: Never Used  . Alcohol use No  Review of Systems Level 5 caveat:  history/ROS limited by acute/critical illness ____________________________________________   PHYSICAL EXAM:  VITAL SIGNS: ED Triage Vitals  Enc Vitals Group     BP 07/22/16 0015 (!) 184/125     Pulse Rate 07/22/16 0014 (!) 147     Resp 07/22/16 0014 12     Temp --      Temp src --      SpO2 07/22/16 0014 100 %     Weight 07/22/16 0013 55.3 kg (122 lb)     Height --      Head Circumference --      Peak Flow --      Pain Score 07/22/16 0030 4     Pain Loc --      Pain Edu? --      Excl. in [place]? --     Constitutional: Toxic appearing, responds to questions but with only a few words.  Reports chest pain or shortness of breath Eyes: Conjunctivae are normal.  Head: Atraumatic. Mouth/Throat: Mucous membranes are moist. Neck: No stridor.  No meningeal signs.   Cardiovascular: Wide-complex tachycardia, irregular rate from 140s-170s. Good peripheral circulation.  Respiratory: Normal respiratory effort.  No retractions. Lungs CTAB though breath sounds are minimal Gastrointestinal:  Soft and nontender. No distention.  Musculoskeletal: No lower extremity tenderness nor edema. No gross deformities of extremities. Neurologic:  Normal speech and language. No gross focal neurologic deficits are appreciated.  Skin:  +P[place]or. Skin is warm, dry and intact. No rash noted.   ____________________________________________   LABS (all labs ordered are listed, but only abnormal results are displayed)  Labs Reviewed  CBC WITH DIFFERENTIAL/PLATELET - Abnormal; Notable for the following:       Result Value   RDW 14.7 (*)    All other components within normal limits  COMPREHENSIVE METABOLIC PANEL - Abnormal; Notable for the following:    Potassium 2.8 (*)    Chloride 96 (*)    Glucose, Bld 122 (*)    Calcium 8.6 (*)    Total Protein 5.6 (*)    Albumin 2.9 (*)    ALT 8 (*)    GFR calc non Af Amer 59 (*)    All other components within normal limits  MAGNESIUM - Abnormal; Notable for the following:    Magnesium 3.2 (*)    All other components within normal limits  TROPONIN I - Abnormal; Notable for the following:    Troponin I 0.03 (*)    All other components within normal limits  PROTIME-INR  APTT   ____________________________________________  EKG  ED ECG REPORT #1 I, Nikeria Kalman, the attending physician, personally viewed and interpreted this ECG.  Date: 07/22/2016 EKG Time: 00:14 Rate: 132 Rhythm: a-fib with RVR QRS Axis: normal Intervals: LBBB, QTc slightly prolonged at 500 ms ST/T Wave abnormalities: Non-specific ST segment / T-wave changes, but no evidence of acute ischemia.   ED ECG REPORT #2 I, Nayvie Lips, the attending physician, personally viewed and interpreted this ECG.  Date: 07/22/2016 EKG Time: 00:25 Rate: 105 Rhythm: a-fib w/ RVR QRS Axis: normal Intervals: LBBB, QTc 482 ms ST/T Wave abnormalities: Non-specific ST segment / T-wave changes, but no evidence of acute ischemia.   ED ECG REPORT #3 I, Mikalah Skyles, the attending  physician, personally viewed and interpreted this ECG.  Date: 07/22/2016 EKG Time: 00:33 Rate: 90 Rhythm: a-fib with variable conduction QRS Axis: normal Intervals: LBBB, QTc 490 ms ST/T Wave abnormalities: Non-specific ST segment / T-wave changes,  but no evidence of acute ischemia.   ____________________________________________  RADIOLOGY   Dg Chest 1 View  Result Date: 07/22/2016 CLINICAL DATA:  Unresponsive, chest pain EXAM: CHEST 1 VIEW COMPARISON:  07/12/2016 FINDINGS: Lower cervical hardware. Multiple skin folds over the left chest. No focal consolidation or pleural effusion. Stable cardiomediastinal silhouette. No pneumothorax. Widened right AC joint IMPRESSION: No radiographic evidence for acute cardiopulmonary abnormality. Electronically Signed   By: Donavan Foil M.D.   On: 07/22/2016 00:53    ____________________________________________   PROCEDURES  Critical Care performed: Yes, see critical care procedure note(s)   Procedure(s) performed:   .Critical Care Performed by: Hinda Kehr Authorized by: Hinda Kehr   Critical care provider statement:    Critical care time (minutes):  60   Critical care time was exclusive of:  Separately billable procedures and treating other patients   Critical care was necessary to treat or prevent imminent or life-threatening deterioration of the following conditions:  Circulatory failure and cardiac failure   Critical care was time spent personally by me on the following activities:  Development of treatment plan with patient or surrogate, discussions with consultants, evaluation of patient's response to treatment, examination of patient, obtaining history from patient or surrogate, ordering and performing treatments and interventions, ordering and review of laboratory studies, ordering and review of radiographic studies, pulse oximetry, re-evaluation of patient's condition and review of old  charts      ____________________________________________   INITIAL IMPRESSION / [place] / ED COURSE  Pertinent labs & imaging results that were available during my care of the patient were reviewed by me and considered in my medical decision making (see chart for details).  The patient appeared to be in a wide-complex but still irregular tachycardia upon arrival.  Because she was responsive and hypertensive, I did not immediately attempt cardioversion but rather began treatment with amiodarone 150 mg IV infused over 10 minutes.  Additionally, given my concern for potentially fatal arrhythmias I gave magnesium 2 g IV.  The patient began slowing down her rate even prior to the amiodarone infusion.  I continue to watch and monitor her with the pads in place and frequently recheck blood pressures as we were getting her settle then with 2 peripheral IVs and sending blood work.  A few minutes after the amiodarone infusion began she converted to a narrow complex rhythm that reads as sinus rhythm on the EKG computer but I believe is actually a variable conduction atrial fibrillation.  She reports no change in her clinical status but her cardiac rhythm looks much improved.  Her blood work came back showing hypokalemia at 2.8, a very slightly elevated troponin, and hypermagnesemia which is likely secondary to the 2 g that I infused (blood work had to be redrawn due to hemolysis on the first set that was drawn prior to the magnesium administration).  After the amiodarone 150 mg over 10 minutes was completed, I began an amiodarone infusion at standard rate of 1 mg/min.  I called and spoke by phone with Dr. Nehemiah Massed who is on call for cardiology.  I discussed the case with him and asked for any other recommendations.  He agreed with my management thus far and did not recommend any additional treatment other than getting her to the ICU for further evaluation and treatment.  She is currently protecting  her airway and is hemodynamically stable.  I paged the hospitalist at about 1:15am to discuss the case and admit.  Also ordered potassium 47mEq  IV over 1 hour x 6 boluses total to be continued after the patient goes to the ICU.  She remains on the cardiac monitor and pads.  Chest x-ray is unremarkable.   Clinical Course as of Jul 22 120  Sat Jul 22, 2016  0122 Discussed with Dr. Jannifer Franklin in detail.  He will admit.  [CF]    Clinical Course User Index [CF] Hinda Kehr, MD    ____________________________________________  FINAL CLINICAL IMPRESSION(S) / ED DIAGNOSES  Final diagnoses:  Cardiac arrhythmia, unspecified cardiac arrhythmia type  Nausea and vomiting, intractability of vomiting not specified, unspecified vomiting type  Chest pain, unspecified type  Hypokalemia  Elevated troponin I level     MEDICATIONS GIVEN DURING THIS VISIT:  Medications  amiodarone (NEXTERONE PREMIX) 360-4.14 MG/200ML-% (1.8 mg/mL) IV infusion (60 mg/hr Intravenous New Bag/Given 07/22/16 0047)  amiodarone (NEXTERONE PREMIX) 360-4.14 MG/200ML-% (1.8 mg/mL) IV infusion (not administered)  potassium chloride 10 mEq in 100 mL IVPB (not administered)  morphine 4 MG/ML injection 4 mg (not administered)  0.9 %  sodium chloride infusion (not administered)  amiodarone (NEXTERONE) IV bolus only 150 mg/100 mL (0 mg Intravenous Stopped 07/22/16 0043)  magnesium sulfate IVPB 2 g 50 mL (0 g Intravenous Stopped 07/22/16 0038)  ondansetron (ZOFRAN) injection 4 mg (4 mg Intravenous Given 07/22/16 0107)     NEW OUTPATIENT MEDICATIONS STARTED DURING THIS VISIT:  New Prescriptions   No medications on file    Modified Medications   No medications on file    Discontinued Medications   No medications on file     Note:  This document was prepared using Dragon voice recognition software and may include unintentional dictation errors.    Hinda Kehr, MD 07/22/16 [phone]

## 2016-07-22 NOTE — Consult Note (Signed)
[facility] Cardiology Consultation Note  Patient ID: Sherry [name], MRN: [id number], DOB/AGE: 1927-06-26 83 y.o. Admit date: 07/22/2016   Date of Consult: 07/22/2016 Primary Physician: [facility], [facility] Primary Cardiologist: None  Chief Complaint:  Chief Complaint  Patient presents with  . Chest Pain   Reason for Consult: atrial fibrillation with rapid ventricular rate  HPI: 83 y.o. female with diabetes with apparent no previous complications essential hypertension makes hyperlipidemia who is had recent onset of pulmonary embolism and placed on appropriate medication management including anticoagulation. Recently she has had significant hypoxia shortness of breath weakness and irregular heartbeat and was seen in the emergency room. At that time she had multiple rhythms for which she had atrial fibrillation with rapid ventricular rate and EKG with left bundle-branch block. Additionally she had some periods of bradycardia with first-degree AV block. After of multiple treatments the patient had intravenous amiodarone which now significantly has helped and maintaining normal sinus rhythm at this time. The patient is hemodynamically stable without chest pain or heart failure type symptoms. There is a troponin of 0.03 more consistent with demand ischemia rather acute coronary syndrome  Past Medical History:  Diagnosis Date  . Diabetes mellitus without complication ([place])   . Hyperlipidemia   . Hypertension   . Pulmonary embolism [facility])       Surgical History:  Past Surgical History:  Procedure Laterality Date  . APPENDECTOMY    . BACK SURGERY    . ESOPHAGOGASTRODUODENOSCOPY N/A 05/29/2016   Procedure: ESOPHAGOGASTRODUODENOSCOPY (EGD);  Surgeon: Clarene Essex, MD;  Location: [place];  Service: Gastroenterology;  Laterality: N/A;  . SHOULDER SURGERY       Home Meds: Prior to Admission medications   Medication Sig Start Date End Date Taking? Authorizing Provider   amLODipine (NORVASC) 5 MG tablet Take 5 mg by mouth daily.   Yes [provider]  apixaban (ELIQUIS) 5 MG TABS tablet Take 1 tablet (5 mg total) by mouth 2 (two) times daily. Patient taking differently: Take 2.5 mg by mouth 2 (two) times daily.  03/08/16  Yes Isaac Bliss, Rayford Halsted, MD  atorvastatin (LIPITOR) 20 MG tablet Take 20 mg by mouth at bedtime.    Yes [provider]  diphenhydrAMINE (BENADRYL) 25 MG tablet Take 25 mg by mouth daily as needed for itching or allergies.   Yes [provider]  ferrous sulfate 325 (65 FE) MG tablet Take 1 tablet (325 mg total) by mouth daily with breakfast. 06/03/16  Yes Lorella Nimrod, MD  gabapentin (NEURONTIN) 300 MG capsule Take 300 mg by mouth at bedtime.    Yes [provider]  insulin degludec (TRESIBA FLEXTOUCH) 100 UNIT/ML SOPN FlexTouch Pen Inject 10 Units into the skin every morning.   Yes [provider]  Insulin Detemir (LEVEMIR) 100 UNIT/ML Pen Inject 8 Units into the skin every morning. 06/02/16  Yes Lorella Nimrod, MD  lisinopril-hydrochlorothiazide (PRINZIDE,ZESTORETIC) 20-12.5 MG tablet Take 2 tablets by mouth daily.    Yes [provider]  magnesium oxide (MAG-OX) 400 (241.3 Mg) MG tablet Take 1 tablet (400 mg total) by mouth daily. 04/27/16  Yes Wieting, Richard, MD  meloxicam (MOBIC) 7.5 MG tablet Take 7.5 mg by mouth daily.   Yes [provider]  metoCLOPramide (REGLAN) 5 MG tablet Take 1 tablet (5 mg total) by mouth 2 (two) times daily. 06/02/16  Yes Lorella Nimrod, MD  Multiple Vitamin (MULTIVITAMIN WITH MINERALS) TABS tablet Take 1 tablet by mouth daily. 06/02/16  Yes Rathore,  Wandra Feinstein, MD  ondansetron (ZOFRAN-ODT) 4 MG disintegrating tablet Take 4 mg by mouth every 6 (six) hours as needed for nausea or vomiting.   Yes [provider]  pantoprazole (PROTONIX) 40 MG tablet Take 1 tablet (40 mg total) by mouth 2 (two) times daily. Patient taking differently: Take 40 mg  by mouth daily.  04/27/16  Yes Wieting, Richard, MD  sertraline (ZOLOFT) 25 MG tablet Take 1 tablet (25 mg total) by mouth daily. 06/02/16  Yes Lorella Nimrod, MD  sucralfate (CARAFATE) 1 GM/10ML suspension Take 10 mLs (1 g total) by mouth 4 (four) times daily -  with meals and at bedtime. 06/02/16  Yes Lorella Nimrod, MD  tamsulosin (FLOMAX) 0.4 MG CAPS capsule Take 1 capsule (0.4 mg total) by mouth daily. Patient taking differently: Take 0.4 mg by mouth daily after lunch.  03/14/16  Yes Fritzi Mandes, MD  Amino Acids-Protein Hydrolys (FEEDING SUPPLEMENT, PRO-STAT SUGAR FREE 64,) LIQD Take 30 mLs by mouth 3 (three) times daily. Patient not taking: Reported on 07/05/2016 06/02/16   Shela Leff, MD  polyethylene glycol [facility]) packet Take 17 g by mouth daily as needed. 04/27/16   Loletha Grayer, MD    Inpatient Medications:  . amLODipine  5 mg Oral Daily  . apixaban  2.5 mg Oral BID  . atorvastatin  20 mg Oral QHS  . docusate sodium  100 mg Oral BID  . feeding supplement (PRO-STAT SUGAR FREE 64)  30 mL Oral TID  . ferrous sulfate  325 mg Oral Q breakfast  . gabapentin  300 mg Oral QHS  . lisinopril  20 mg Oral Daily   And  . hydrochlorothiazide  12.5 mg Oral Daily  . insulin aspart  0-9 Units Subcutaneous TID WC  . insulin detemir  4 Units Subcutaneous QHS  . magnesium oxide  400 mg Oral Daily  . mouth rinse  15 mL Mouth Rinse BID  . metoCLOPramide  5 mg Oral BID  . multivitamin with minerals  1 tablet Oral Daily  . pantoprazole  40 mg Oral BID  . potassium chloride SA  20 mEq Oral Daily  . sertraline  25 mg Oral Daily  . sucralfate  1 g Oral TID WC & HS  . tamsulosin  0.4 mg Oral QPC lunch   . amiodarone 30 mg/hr (07/22/16 0634)  . potassium chloride 10 mEq (07/22/16 0700)    Allergies: No Known Allergies  Social History   Social History  . Marital status: Single    Spouse name: N/A  . Number of children: N/A  . Years of education: N/A   Occupational History  . Not on  file.   Social History Main Topics  . Smoking status: Never Smoker  . Smokeless tobacco: Never Used  . Alcohol use No  . Drug use: No  . Sexual activity: No   Other Topics Concern  . Not on file   Social History Narrative  . No narrative on file     Family History  Problem Relation Age of Onset  . Diabetes Mother   . Cancer Mother   . Cancer Father   . Bladder Cancer Neg Hx   . Kidney cancer Neg Hx      Review of Systems  Unable to assess  Labs:  Recent Labs  07/22/16 0034 07/22/16 0448  TROPONINI 0.03* 0.03*   Lab Results  Component Value Date   WBC 8.1 07/22/2016   HGB 13.4 07/22/2016   HCT 39.7 07/22/2016  MCV 81.1 07/22/2016   PLT 358 07/22/2016    Recent Labs Lab 07/22/16 0034  NA 136  K 2.8*  CL 96*  CO2 28  BUN 10  CREATININE 0.86  CALCIUM 8.6*  PROT 5.6*  BILITOT 0.8  ALKPHOS 58  ALT 8*  AST 19  GLUCOSE 122*   Lab Results  Component Value Date   CHOL 112 04/19/2016   HDL 61 04/19/2016   LDLCALC 34 04/19/2016   TRIG 83 04/19/2016   Lab Results  Component Value Date   DDIMER 1.39 (H) 03/07/2016    Radiology/Studies:  Ct Abdomen Pelvis Wo Contrast  Result Date: 07/13/2016 CLINICAL DATA:  Abdominal pain.  No p.o. intake for 3 days. EXAM: CT ABDOMEN AND PELVIS WITHOUT CONTRAST TECHNIQUE: Multidetector CT imaging of the abdomen and pelvis was performed following the standard protocol without IV contrast. COMPARISON:  Renal protocol CT 06/22/2016. FINDINGS: Lower chest: The lung bases are clear. Again seen hiatal hernia with thickening of distal esophagus. Hepatobiliary: Punctate hepatic granuloma. Low-density lesion in the liver on prior contrast-enhanced exam is not well visualized without contrast. Gallbladder physiologically distended, no calcified stone. No biliary dilatation. Pancreas: Parenchymal atrophy. No ductal dilatation or inflammation. Spleen: Normal in size without focal abnormality. Adrenals/Urinary Tract: Normal adrenal  glands. Abnormal density in the right renal pelvis which was previously assessed on renal protocol CT. There is prominence of the a upper pole calices of the right kidney. Filling defects on prior exam not well visualized without contrast. Mild perinephric edema about both kidneys is unchanged from prior. Urinary bladder is physiologically distended. Air in the urinary bladder likely related to prior Foley catheter. No definite bladder wall thickening. Stomach/Bowel: Moderate hiatal hernia with thickening of the distal esophagus. No small bowel dilatation or inflammation. Colonic tortuosity without inflammatory change. Duodenum diverticulum is again seen without inflammation. Appendix is not visualized. Vascular/Lymphatic: Aortic and branch atherosclerosis. No aneurysm. No adenopathy. Reproductive: Atrophic uterus, normal for age.  No adnexal mass. Other: No free air, free fluid, or intra-abdominal fluid collection. Musculoskeletal: Chronic L3 compression fracture is stable from prior exam. Again seen degenerative change in the spine. IMPRESSION: 1. Again seen moderate hiatal hernia and distal esophageal wall thickening. 2. Abnormal density in the right renal pelvis, better characterized on previous contrast-enhanced exams, concerning for transitional cell carcinoma. 3. No new abnormality is seen. 4. Aortic atherosclerosis. Electronically Signed   By: Jeb Levering M.D.   On: 07/13/2016 01:39   Dg Neck Soft Tissue  Result Date: 06/30/2016 CLINICAL DATA:  Acute onset of throat pain and dysphagia. Initial encounter. EXAM: NECK SOFT TISSUES - 1+ VIEW COMPARISON:  CT myelogram of the cervical spine performed 09/28/2005 FINDINGS: Prevertebral soft tissues are within normal limits. The nasopharynx, oropharynx and hypopharynx are grossly unremarkable. The epiglottis is normal in thickness. The proximal trachea is unremarkable. The patient is status post anterior cervical spinal fusion at C4-C7. Anterior osteophyte  formation is noted at C3-C4. The visualized lung apices are grossly clear. IMPRESSION: Unremarkable radiographs of the soft tissues of the neck. Electronically Signed   By: Garald Balding M.D.   On: 06/30/2016 22:28   Dg Chest 1 View  Result Date: 07/22/2016 CLINICAL DATA:  Unresponsive, chest pain EXAM: CHEST 1 VIEW COMPARISON:  07/12/2016 FINDINGS: Lower cervical hardware. Multiple skin folds over the left chest. No focal consolidation or pleural effusion. Stable cardiomediastinal silhouette. No pneumothorax. Widened right AC joint IMPRESSION: No radiographic evidence for acute cardiopulmonary abnormality. Electronically Signed   By: Maudie Mercury  Francoise Ceo M.D.   On: 07/22/2016 00:53   Dg Chest 2 View  Result Date: 07/12/2016 CLINICAL DATA:  Chest pain. Weakness, nausea and vomiting. No p.o. intake for 3 days. EXAM: CHEST  2 VIEW COMPARISON:  Most recent comparison radiograph 06/30/2016. Chest CT 03/09/2016 FINDINGS: The cardiomediastinal contours are normal. Again seen atherosclerosis of thoracic aorta. Hiatal hernia. Minimal basilar atelectasis. Pulmonary vasculature is normal. No consolidation, pleural effusion, or pneumothorax. No acute osseous abnormalities are seen. Chronic widening of right acromioclavicular joint. Bones are under mineralized. IMPRESSION: No acute abnormality. Electronically Signed   By: Jeb Levering M.D.   On: 07/12/2016 19:44   Dg Chest 2 View  Result Date: 06/30/2016 CLINICAL DATA:  Dysphagia and throat pain for 4 days. EXAM: CHEST  2 VIEW COMPARISON:  Chest radiographs 05/28/2016. CT 4 days prior 06/22/2016 FINDINGS: The cardiomediastinal contours are normal. Atherosclerosis of the thoracic aorta. Moderate hiatal hernia. Small right pleural effusion on recent abdominal CT not well seen radiographically. Pulmonary vasculature is normal. No consolidation or pneumothorax. The bones are under mineralized. Chronic change about the right shoulder. No acute osseous abnormalities are  seen. IMPRESSION: 1. No acute abnormality.  Thoracic aortic atherosclerosis. 2. Hiatal hernia. Recent abdominal CT demonstrated esophageal thickening of the distal esophagus, possibly cause of patient's dysphasia. Electronically Signed   By: Jeb Levering M.D.   On: 06/30/2016 22:28   Dg Abd 1 View  Result Date: 07/18/2016 CLINICAL DATA:  Nausea, vomiting. EXAM: ABDOMEN - 1 VIEW COMPARISON:  CT scan of July 13, 2016. FINDINGS: The bowel gas pattern is normal. Mild amount of stool is seen in the colon. Phleboliths are noted in the pelvis. IMPRESSION: No evidence of bowel obstruction or ileus. Electronically Signed   By: Marijo Conception, M.D.   On: 07/18/2016 13:22   Ct Head Wo Contrast  Result Date: 07/12/2016 CLINICAL DATA:  Generalized weakness with nausea and vomiting. EXAM: CT HEAD WITHOUT CONTRAST TECHNIQUE: Contiguous axial images were obtained from the base of the skull through the vertex without intravenous contrast. COMPARISON:  Brain MRI 04/19/2016.  Head CT 04/18/2016 FINDINGS: Brain: No evidence of acute infarction, hemorrhage, hydrocephalus, extra-axial collection or mass lesion/mass effect. Mild to moderate for age atrophy, most prominent in the frontal lobes. 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 Extensive chronic microvascular ischemic gliosis in the cerebral white matter. Vascular: Atherosclerotic calcification.  No hyperdense vessel. Skull: No acute finding Sinuses/Orbits: No acute finding.  Bilateral cataract resection. IMPRESSION: 1. No acute finding or change from prior. 2. Chronic small vessel ischemic injury. Electronically Signed   By: Monte Fantasia M.D.   On: 07/12/2016 20:21   Ct Hematuria Workup  Result Date: 06/22/2016 CLINICAL DATA:  Renal mass follow up. EXAM: CT ABDOMEN AND PELVIS WITHOUT AND WITH CONTRAST TECHNIQUE: Multidetector CT imaging of the abdomen and pelvis was performed following the standard protocol before and following the bolus  administration of intravenous contrast. CONTRAST:  179mL ISOVUE-300 IOPAMIDOL (ISOVUE-300) INJECTION 61% COMPARISON:  04/19/2016 FINDINGS: Lower chest: Moderate to large hiatal hernia. Distal esophageal wall thickening approaching the hiatal hernia as on image 1/5. Trace bilateral pleural effusions. Coronary artery atherosclerosis. Borderline cardiomegaly. Hepatobiliary: Subtle subcapsular hypodensity node along the right hepatic lobe on image 14/11, similar to prior. Slight nodularity of the liver margin. Pancreas:  Circumportal pancreas.  Atrophy in the pancreatic tail. Spleen: Unremarkable Adrenals/Urinary Tract: Adrenal glands normal. As on the 03/09/2016 exam, low-density filling defect fills upper pole calices on the right and  there is likewise abnormal filling defect in the right renal pelvis centrally. There is some faintly accentuated marginal or mucosal enhancement along the right renal collecting system on portal venous phase images as on image 34/5. Borderline wall thickening in the right proximal ureter on images 39-40 series 15. Subtle mucosal filling defect along the right anterolateral urinary bladder on images 25-22 series 15, although there is no significant abnormal accentuated enhancement in this vicinity on the portal venous phase images. A Foley catheter is present in the urinary bladder. Stomach/Bowel: Periampullary duodenal diverticulum. Prominence of stool in the rectal vault with several hyperdensities possibly from suppositories or pill fragments. Mild presacral edema although less than on the prior exam. Generalized prominence of stool in the colon suggesting constipation. Vascular/Lymphatic: Aortoiliac atherosclerotic vascular disease. No pathologic adenopathy identified. Reproductive: Unremarkable Other: No supplemental non-categorized findings. Musculoskeletal: Stable approximately 50% L3 compression fracture with minimal posterior bony retropulsion. Lumbar spondylosis and degenerative  disc disease causing right foraminal impingement at L5-S1. IMPRESSION: 1. Prominent filling defects in the right kidney upper pole calices and collecting system very similar in appearance to 03/09/2016. There is some faint mucosal enhancement in the collecting system but these filling defects themselves do not appreciably enhance. Possibilities include transitional cell carcinoma, fungus balls, or less likely chronic thrombus. 2. Distal esophageal wall thickening, probably from esophagitis. Moderate to large hiatal hernia. 3. The rectum is distended with stool and there some presacral stranding. Low-grade stercoral colitis is not totally excluded. However, there is generalized constipation and the degree of perirectal stranding is actually reduced compared to 04/19/2016. 4. Trace bilateral pleural effusions. 5. Coronary atherosclerosis.  Aortic Atherosclerosis (ICD10-I70.0). 6. Chronic L3 compression fracture. 7. Circumportal pancreas. Electronically Signed   By: Van Clines M.D.   On: 06/22/2016 16:31    EKG: Normal sinus rhythm with first-degree AV block and left bundle branch block Previously showed atrial fibrillation with rapid ventricular rate and left bundle branch block  Weights: Filed Weights   07/22/16 0013 07/22/16 0344  Weight: 55.3 kg (122 lb) 56 kg (123 lb 6.4 oz)     Physical Exam: Blood pressure (!) 176/61, pulse 76, temperature (!) 97.5 F (36.4 C), temperature source Oral, resp. rate 18, height 5\' 4"  (1.626 m), weight 56 kg (123 lb 6.4 oz), SpO2 100 %. Body mass index is 21.18 kg/m. General: Well developed, Poorly nourished, in no acute distress. Head eyes ears nose throat: Normocephalic, atraumatic, sclera non-icteric, no xanthomas, nares are without discharge. No apparent thyromegaly and/or mass  Lungs: Normal respiratory effort.  Some wheezes, no rales, few rhonchi.  Heart: RRR with normal S1 S2. no murmur gallop, no rub, PMI is normal size and placement, carotid  upstroke normal without bruit, jugular venous pressure is normal Abdomen: Soft, non-tender, non-distended with normoactive bowel sounds. No hepatomegaly. No rebound/guarding. No obvious abdominal masses. Abdominal aorta is normal size without bruit Extremities: Trace edema. no cyanosis, no clubbing, no ulcers  Peripheral : 2+ bilateral upper extremity pulses, 2+ bilateral femoral pulses, 2+ bilateral dorsal pedal pulse Neuro: Alert and oriented. No facial asymmetry. No focal deficit. Moves all extremities spontaneously. Musculoskeletal: Normal muscle tone with kyphosis Psych:  Responds to questions appropriately with a normal affect.    Assessment: 83 year old female with diabetes essential hypertension mixed hyperlipidemia and previous history of pulmonary embolism having new onset atrial fibrillation with rapid ventricular rate and left bundle branch block without evidence of congestive heart failure or myocardial infarction  Plan: 1. Continue supportive care of previous pulmonary  embolism and possible hypoxia with anticoagulation and oxygenation 2. Continue amiodarone drip through today and changed to oral amiodarone 400 mg each day 3. Follow closely for significant bradycardia and first-degree heart block due to amiodarone 4. Continue anticoagulation for both pulmonary embolism and atrial fibrillation further risk reduction in stroke    5. Echocardiogram to assess extent of LV systolic dysfunction valvular heart disease contributing to above 6. Continue hypertension control with ACE inhibitor hydrochlorothiazide 7. Begin ambulation and follow for improvements of symptoms and need for adjustments of medications.  Signed, Corey Skains M.D. [facility] 07/22/2016, 7:33 AM

## 2016-07-22 NOTE — ED Notes (Signed)
Pt with nausea and emesis. md notified, order for zofran received.

## 2016-07-22 NOTE — Progress Notes (Signed)
[facility] at [place] NAME: Sherry [name]    MR#:  [id number]  DATE OF BIRTH:  [birth year]  SUBJECTIVE:  CHIEF COMPLAINT:   Chief Complaint  Patient presents with  . Chest Pain   -Admitted this morning with nausea and palpitations. Noted to be in A. fib with RVR. -Currently on amiodarone drip, converted to sinus rhythm with sinus bradycardia. Still has some nausea.  REVIEW OF SYSTEMS:  Review of Systems  Constitutional: Positive for malaise/fatigue. Negative for chills and fever.  HENT: Negative for ear discharge, hearing loss and nosebleeds.   Eyes: Negative for blurred vision.  Respiratory: Negative for cough, shortness of breath and wheezing.   Cardiovascular: Negative for chest pain, palpitations and leg swelling.  Gastrointestinal: Positive for nausea. Negative for abdominal pain, constipation, diarrhea and vomiting.  Genitourinary: Negative for dysuria.  Neurological: Negative for dizziness, sensory change, speech change, focal weakness, seizures and headaches.  Psychiatric/Behavioral: Negative for depression.    DRUG ALLERGIES:  No Known Allergies  VITALS:  Blood pressure (!) 176/61, pulse 76, temperature (!) 97.5 F (36.4 C), temperature source Oral, resp. rate 18, height 5\' 4"  (1.626 m), weight 56 kg (123 lb 6.4 oz), SpO2 100 %.  PHYSICAL EXAMINATION:  Physical Exam  GENERAL:  83 y.o.-year-old elderly patient lying in the bed with no acute distress.  EYES: Pupils equal, round, reactive to light and accommodation. No scleral icterus. Extraocular muscles intact.  HEENT: Head atraumatic, normocephalic. Oropharynx and nasopharynx clear.  NECK:  Supple, no jugular venous distention. No thyroid enlargement, no tenderness.  LUNGS: Normal breath sounds bilaterally, no wheezing, rales,rhonchi or crepitation. No use of accessory muscles of respiration.  CARDIOVASCULAR: S1, S2 normal. No rubs, or gallops. 2/6 systolic murmur is  present ABDOMEN: Soft, nontender, nondistended. Bowel sounds present. No organomegaly or mass.  EXTREMITIES: No pedal edema, cyanosis, or clubbing.  NEUROLOGIC: Cranial nerves II through XII are intact. Muscle strength 5/5 in all extremities. Sensation intact. Gait not checked.  PSYCHIATRIC: The patient is alert and oriented x 3.  SKIN: No obvious rash, lesion, or ulcer.    LABORATORY PANEL:   CBC  Recent Labs Lab 07/22/16 0017  WBC 8.1  HGB 13.4  HCT 39.7  PLT 358   ------------------------------------------------------------------------------------------------------------------  Chemistries   Recent Labs Lab 07/22/16 0034  NA 136  K 2.8*  CL 96*  CO2 28  GLUCOSE 122*  BUN 10  CREATININE 0.86  CALCIUM 8.6*  MG 3.2*  AST 19  ALT 8*  ALKPHOS 58  BILITOT 0.8   ------------------------------------------------------------------------------------------------------------------  Cardiac Enzymes  Recent Labs Lab 07/22/16 0733  TROPONINI 0.07*   ------------------------------------------------------------------------------------------------------------------  RADIOLOGY:  Dg Chest 1 View  Result Date: 07/22/2016 CLINICAL DATA:  Unresponsive, chest pain EXAM: CHEST 1 VIEW COMPARISON:  07/12/2016 FINDINGS: Lower cervical hardware. Multiple skin folds over the left chest. No focal consolidation or pleural effusion. Stable cardiomediastinal silhouette. No pneumothorax. Widened right AC joint IMPRESSION: No radiographic evidence for acute cardiopulmonary abnormality. Electronically Signed   By: Donavan Foil M.D.   On: 07/22/2016 00:53    EKG:   Orders placed or performed in visit on 07/22/16  . EKG 12-Lead    ASSESSMENT AND PLAN:   83 year old female with past medical history significant for pulmonary embolism on eliquis, hypertension, diabetes and hyperlipidemia presents to the hospital with nausea and noted to be in A. fib.  #1 atrial fibrillation with  RVR-likely triggered by her nausea and hypokalemia. -Potassium is  being replaced. On amiodarone drip, converted to normal sinus rhythm with some sinus bradycardia. -Continue amiodarone drip today and change to oral amiodarone tomorrow. -Appreciate cardiology consult. -Ambulate in a.m.  #2 nausea-likely viral gastroenteritis. Much improved now. Monitor.  #3 pulmonary embolism-continue eliquis. Not on home oxygen. Wean the 2 L oxygen.  #4 diabetes mellitus-on Levemir, ssi  #5 hypertension-continue lisinopril and Norvasc. Hold hydrochlorothiazide  #6 DVT prophylaxis-on eliquis  PT consult in AM   All the records are reviewed and case discussed with Care Management/Social Workerr. Management plans discussed with the patient, family and they are in agreement.  CODE STATUS: Full code  TOTAL TIME TAKING CARE OF THIS PATIENT: 36 minutes.   POSSIBLE D/C IN 1-2 DAYS, DEPENDING ON CLINICAL CONDITION.   Gladstone Lighter M.D on 07/22/2016 at 8:28 AM  Between 7am to 6pm - Pager - [phone]  After 6pm go to [email or web address] - password [email or web address] [facility]  Office  [phone]  CC: Primary care physician; Center, [facility]

## 2016-07-22 NOTE — ED Notes (Signed)
Critical troponin of 0.03 called from lab. Dr. Karma Greaser notified, no new orders received.

## 2016-07-23 DIAGNOSIS — I480 Paroxysmal atrial fibrillation: Secondary | ICD-10-CM | POA: Diagnosis not present

## 2016-07-23 DIAGNOSIS — E876 Hypokalemia: Secondary | ICD-10-CM | POA: Diagnosis not present

## 2016-07-23 LAB — BASIC METABOLIC PANEL
Anion gap: 7 (ref 5–15)
BUN: 11 mg/dL (ref 6–20)
CHLORIDE: 98 mmol/L — AB (ref 101–111)
CO2: 30 mmol/L (ref 22–32)
Calcium: 8.1 mg/dL — ABNORMAL LOW (ref 8.9–10.3)
Creatinine, Ser: 0.96 mg/dL (ref 0.44–1.00)
GFR calc non Af Amer: 52 mL/min — ABNORMAL LOW (ref >60)
Glucose, Bld: 126 mg/dL — ABNORMAL HIGH (ref 65–99)
POTASSIUM: 4.2 mmol/L (ref 3.5–5.1)
Sodium: 135 mmol/L (ref 135–145)

## 2016-07-23 LAB — GLUCOSE, CAPILLARY
Glucose-Capillary: 123 mg/dL — ABNORMAL HIGH (ref 65–99)
Glucose-Capillary: 125 mg/dL — ABNORMAL HIGH (ref 65–99)
Glucose-Capillary: 169 mg/dL — ABNORMAL HIGH (ref 65–99)
Glucose-Capillary: 109 mg/dL — ABNORMAL HIGH (ref 65–99)

## 2016-07-23 LAB — TROPONIN I: Troponin I: 0.22 ng/mL (ref <0.03)

## 2016-07-23 MED ORDER — AMIODARONE HCL 200 MG PO TABS
200.0000 mg | ORAL_TABLET | Freq: Every day | ORAL | Status: DC
  Administered 2016-07-23: 200 mg via ORAL
  Filled 2016-07-23: qty 1

## 2016-07-23 MED ORDER — AMIODARONE HCL 200 MG PO TABS: 200.0000 mg | ORAL_TABLET | Freq: Two times a day (BID) | ORAL | 2 refills | Status: DC

## 2016-07-23 MED ORDER — AMIODARONE HCL 200 MG PO TABS
200.0000 mg | ORAL_TABLET | Freq: Two times a day (BID) | ORAL | Status: DC
  Administered 2016-07-23 – 2016-07-24 (×2): 200 mg via ORAL
  Filled 2016-07-23 (×2): qty 1

## 2016-07-23 MED ORDER — APIXABAN 2.5 MG PO TABS
2.5000 mg | ORAL_TABLET | Freq: Two times a day (BID) | ORAL | 2 refills | Status: DC
Start: 2016-07-23 — End: 2016-11-21

## 2016-07-23 MED ORDER — LISINOPRIL 20 MG PO TABS: 20.0000 mg | ORAL_TABLET | Freq: Every day | ORAL | 2 refills | Status: DC

## 2016-07-23 NOTE — Progress Notes (Signed)
[facility] at [place] NAME: Sherry [name]    MR#:  [id number]  DATE OF BIRTH:  29-Mar-1927  SUBJECTIVE:  CHIEF COMPLAINT:   Chief Complaint  Patient presents with  . Chest Pain   -feels much better today. Off the amiodarone drip. However when she worked with physical therapy, heart rate increased  REVIEW OF SYSTEMS:  Review of Systems  Constitutional: Positive for malaise/fatigue. Negative for chills and fever.  HENT: Negative for ear discharge, hearing loss and nosebleeds.   Eyes: Negative for blurred vision.  Respiratory: Negative for cough, shortness of breath and wheezing.   Cardiovascular: Negative for chest pain, palpitations and leg swelling.  Gastrointestinal: Negative for abdominal pain, constipation, diarrhea, nausea and vomiting.  Genitourinary: Negative for dysuria.  Neurological: Negative for dizziness, sensory change, speech change, focal weakness, seizures and headaches.  Psychiatric/Behavioral: Negative for depression.    DRUG ALLERGIES:  No Known Allergies  VITALS:  Blood pressure (!) 124/43, pulse 75, temperature 98.2 F (36.8 C), temperature source Oral, resp. rate 18, height 5\' 4"  (1.626 m), weight 56.8 kg (125 lb 4.8 oz), SpO2 100 %.  PHYSICAL EXAMINATION:  Physical Exam  GENERAL:  83 y.o.-year-old elderly patient lying in the bed with no acute distress.  EYES: Pupils equal, round, reactive to light and accommodation. No scleral icterus. Extraocular muscles intact.  HEENT: Head atraumatic, normocephalic. Oropharynx and nasopharynx clear.  NECK:  Supple, no jugular venous distention. No thyroid enlargement, no tenderness.  LUNGS: Normal breath sounds bilaterally, no wheezing, rales,rhonchi or crepitation. No use of accessory muscles of respiration.  CARDIOVASCULAR: S1, S2 normal. No rubs, or gallops. 2/6 systolic murmur is present ABDOMEN: Soft, nontender, nondistended. Bowel sounds present. No organomegaly or  mass.  EXTREMITIES: No pedal edema, cyanosis, or clubbing.  NEUROLOGIC: Cranial nerves II through XII are intact. Muscle strength 5/5 in all extremities. Sensation intact. Gait not checked.  PSYCHIATRIC: The patient is alert and oriented x 3.  SKIN: No obvious rash, lesion, or ulcer.    LABORATORY PANEL:   CBC  Recent Labs Lab 07/22/16 0017  WBC 8.1  HGB 13.4  HCT 39.7  PLT 358   ------------------------------------------------------------------------------------------------------------------  Chemistries   Recent Labs Lab 07/22/16 0034  07/23/16 0552  NA 136  --  135  K 2.8*  < > 4.2  CL 96*  --  98*  CO2 28  --  30  GLUCOSE 122*  --  126*  BUN 10  --  11  CREATININE 0.86  --  0.96  CALCIUM 8.6*  --  8.1*  MG 3.2*  --   --   AST 19  --   --   ALT 8*  --   --   ALKPHOS 58  --   --   BILITOT 0.8  --   --   < > = values in this interval not displayed. ------------------------------------------------------------------------------------------------------------------  Cardiac Enzymes  Recent Labs Lab 07/22/16 1307  TROPONINI 0.16*   ------------------------------------------------------------------------------------------------------------------  RADIOLOGY:  Dg Chest 1 View  Result Date: 07/22/2016 CLINICAL DATA:  Unresponsive, chest pain EXAM: CHEST 1 VIEW COMPARISON:  07/12/2016 FINDINGS: Lower cervical hardware. Multiple skin folds over the left chest. No focal consolidation or pleural effusion. Stable cardiomediastinal silhouette. No pneumothorax. Widened right AC joint IMPRESSION: No radiographic evidence for acute cardiopulmonary abnormality. Electronically Signed   By: Donavan Foil M.D.   On: 07/22/2016 00:53    EKG:   Orders placed or performed in visit  on 07/22/16  . EKG 12-Lead  . EKG 12-Lead    ASSESSMENT AND PLAN:   83 year old female with past medical history significant for pulmonary embolism on eliquis, hypertension, diabetes and  hyperlipidemia presents to the hospital with nausea and noted to be in A. fib.  #1 atrial fibrillation with RVR-likely triggered by her nausea and hypokalemia. -patient converted to normal sinus rhythm, amiodarone drip changed to oral amiodarone. Heart rate still elevated with ambulation. -We'll try exertion and ambulation later today -Appreciate cardiology consult. -Ambulate again- possible discharge in AM  #2 nausea-likely viral gastroenteritis. Much improved now. Monitor.  #3 pulmonary embolism-continue eliquis. Not on home oxygen. Weaned off oxygen.  #4 diabetes mellitus-on Levemir, ssi  #5 hypertension-continue lisinopril and Norvasc. Hold hydrochlorothiazide  #6 DVT prophylaxis-on eliquis  PT consulted   All the records are reviewed and case discussed with Care Management/Social Workerr. Management plans discussed with the patient, family and they are in agreement.  CODE STATUS: Full code  TOTAL TIME TAKING CARE OF THIS PATIENT: 36 minutes.   POSSIBLE D/C TOMORROW, DEPENDING ON CLINICAL CONDITION.   Gladstone Lighter M.D on 07/23/2016 at 11:04 AM  Between 7am to 6pm - Pager - [phone]  After 6pm go to [email or web address] - password [email or web address]  Office  [phone]  CC: Primary care physician; Center, [facility]

## 2016-07-23 NOTE — Progress Notes (Signed)
Dr Marcille Blanco made aware of ectopics on telemetry (nonsustained runs VT).  No further orders at this time.

## 2016-07-23 NOTE — Progress Notes (Signed)
[facility] Encounter Note  Patient: Sherry [name] / Admit Date: 07/22/2016 / Date of Encounter: 07/23/2016, 6:59 AM   Subjective: Significant improvements today with no evidence of nausea vomiting or other feeling of illness. Atrial fibrillation now controlled and maintaining normal sinus rhythm with bundle branch block  Review of Systems: Positive for: Weakness Negative for: Vision change, hearing change, syncope, dizziness, nausea, vomiting,diarrhea, bloody stool, stomach pain, cough, congestion, diaphoresis, urinary frequency, urinary pain,skin lesions, skin rashes Others previously listed  Objective: Telemetry: Normal sinus rhythm with first-degree AV block Physical Exam: Blood pressure (!) 124/43, pulse 75, temperature 98.2 F (36.8 C), temperature source Oral, resp. rate 18, height 5\' 4"  (1.626 m), weight 56.8 kg (125 lb 4.8 oz), SpO2 100 %. Body mass index is 21.51 kg/m. General: Well developed, well nourished, in no acute distress. Head: Normocephalic, atraumatic, sclera non-icteric, no xanthomas, nares are without discharge. Neck: No apparent masses Lungs: Normal respirations with no wheezes, no rhonchi, no rales , no crackles   Heart: Regular rate and rhythm, normal S1 S2, no murmur, no rub, no gallop, PMI is normal size and placement, carotid upstroke normal without bruit, jugular venous pressure normal Abdomen: Soft, non-tender, non-distended with normoactive bowel sounds. No hepatosplenomegaly. Abdominal aorta is normal size without bruit Extremities: No edema, no clubbing, no cyanosis, no ulcers,  Peripheral: 2+ radial, 2+ femoral, 2+ dorsal pedal pulses Neuro: Alert and oriented. Moves all extremities spontaneously. Psych:  Responds to questions appropriately with a normal affect.   Intake/Output Summary (Last 24 hours) at 07/23/16 0659 Last data filed at 07/23/16 0300  Gross per 24 hour  Intake           471.22 ml  Output              200 ml   Net           271.22 ml    Inpatient Medications:  . amiodarone  200 mg Oral Daily  . amLODipine  5 mg Oral Daily  . apixaban  2.5 mg Oral BID  . atorvastatin  20 mg Oral QHS  . docusate sodium  100 mg Oral BID  . feeding supplement (PRO-STAT SUGAR FREE 64)  30 mL Oral TID  . ferrous sulfate  325 mg Oral Q breakfast  . gabapentin  300 mg Oral QHS  . insulin aspart  0-9 Units Subcutaneous TID WC  . insulin detemir  4 Units Subcutaneous QHS  . lisinopril  20 mg Oral Daily  . magnesium oxide  400 mg Oral Daily  . mouth rinse  15 mL Mouth Rinse BID  . metoCLOPramide  5 mg Oral BID  . multivitamin with minerals  1 tablet Oral Daily  . pantoprazole  40 mg Oral BID  . potassium chloride SA  20 mEq Oral Daily  . sertraline  25 mg Oral Daily  . sucralfate  1 g Oral TID WC & HS  . tamsulosin  0.4 mg Oral QPC lunch   Infusions:   Labs:  Recent Labs  07/22/16 0034 07/22/16 1307 07/23/16 0552  NA 136  --  135  K 2.8* 4.5 4.2  CL 96*  --  98*  CO2 28  --  30  GLUCOSE 122*  --  126*  BUN 10  --  11  CREATININE 0.86  --  0.96  CALCIUM 8.6*  --  8.1*  MG 3.2*  --   --     Recent Labs  07/22/16 0034  AST 19  ALT 8*  ALKPHOS 58  BILITOT 0.8  PROT 5.6*  ALBUMIN 2.9*    Recent Labs  07/22/16 0017  WBC 8.1  NEUTROABS 4.0  HGB 13.4  HCT 39.7  MCV 81.1  PLT 358    Recent Labs  07/22/16 0034 07/22/16 0448 07/22/16 0733 07/22/16 1307  TROPONINI 0.03* 0.03* 0.07* 0.16*   Invalid input(s): POCBNP No results for input(s): HGBA1C in the last 72 hours.   Weights: Filed Weights   07/22/16 0013 07/22/16 0344 07/23/16 0605  Weight: 55.3 kg (122 lb) 56 kg (123 lb 6.4 oz) 56.8 kg (125 lb 4.8 oz)     Radiology/Studies:  Ct Abdomen Pelvis Wo Contrast  Result Date: 07/13/2016 CLINICAL DATA:  Abdominal pain.  No p.o. intake for 3 days. EXAM: CT ABDOMEN AND PELVIS WITHOUT CONTRAST TECHNIQUE: Multidetector CT imaging of the abdomen and pelvis was performed following the  standard protocol without IV contrast. COMPARISON:  Renal protocol CT 06/22/2016. FINDINGS: Lower chest: The lung bases are clear. Again seen hiatal hernia with thickening of distal esophagus. Hepatobiliary: Punctate hepatic granuloma. Low-density lesion in the liver on prior contrast-enhanced exam is not well visualized without contrast. Gallbladder physiologically distended, no calcified stone. No biliary dilatation. Pancreas: Parenchymal atrophy. No ductal dilatation or inflammation. Spleen: Normal in size without focal abnormality. Adrenals/Urinary Tract: Normal adrenal glands. Abnormal density in the right renal pelvis which was previously assessed on renal protocol CT. There is prominence of the a upper pole calices of the right kidney. Filling defects on prior exam not well visualized without contrast. Mild perinephric edema about both kidneys is unchanged from prior. Urinary bladder is physiologically distended. Air in the urinary bladder likely related to prior Foley catheter. No definite bladder wall thickening. Stomach/Bowel: Moderate hiatal hernia with thickening of the distal esophagus. No small bowel dilatation or inflammation. Colonic tortuosity without inflammatory change. Duodenum diverticulum is again seen without inflammation. Appendix is not visualized. Vascular/Lymphatic: Aortic and branch atherosclerosis. No aneurysm. No adenopathy. Reproductive: Atrophic uterus, normal for age.  No adnexal mass. Other: No free air, free fluid, or intra-abdominal fluid collection. Musculoskeletal: Chronic L3 compression fracture is stable from prior exam. Again seen degenerative change in the spine. IMPRESSION: 1. Again seen moderate hiatal hernia and distal esophageal wall thickening. 2. Abnormal density in the right renal pelvis, better characterized on previous contrast-enhanced exams, concerning for transitional cell carcinoma. 3. No new abnormality is seen. 4. Aortic atherosclerosis. Electronically Signed    By: Jeb Levering M.D.   On: 07/13/2016 01:39   Dg Neck Soft Tissue  Result Date: 06/30/2016 CLINICAL DATA:  Acute onset of throat pain and dysphagia. Initial encounter. EXAM: NECK SOFT TISSUES - 1+ VIEW COMPARISON:  CT myelogram of the cervical spine performed 09/28/2005 FINDINGS: Prevertebral soft tissues are within normal limits. The nasopharynx, oropharynx and hypopharynx are grossly unremarkable. The epiglottis is normal in thickness. The proximal trachea is unremarkable. The patient is status post anterior cervical spinal fusion at C4-C7. Anterior osteophyte formation is noted at C3-C4. The visualized lung apices are grossly clear. IMPRESSION: Unremarkable radiographs of the soft tissues of the neck. Electronically Signed   By: Garald Balding M.D.   On: 06/30/2016 22:28   Dg Chest 1 View  Result Date: 07/22/2016 CLINICAL DATA:  Unresponsive, chest pain EXAM: CHEST 1 VIEW COMPARISON:  07/12/2016 FINDINGS: Lower cervical hardware. Multiple skin folds over the left chest. No focal consolidation or pleural effusion. Stable cardiomediastinal silhouette. No pneumothorax. Widened right AC joint IMPRESSION:  No radiographic evidence for acute cardiopulmonary abnormality. Electronically Signed   By: Donavan Foil M.D.   On: 07/22/2016 00:53   Dg Chest 2 View  Result Date: 07/12/2016 CLINICAL DATA:  Chest pain. Weakness, nausea and vomiting. No p.o. intake for 3 days. EXAM: CHEST  2 VIEW COMPARISON:  Most recent comparison radiograph 06/30/2016. Chest CT 03/09/2016 FINDINGS: The cardiomediastinal contours are normal. Again seen atherosclerosis of thoracic aorta. Hiatal hernia. Minimal basilar atelectasis. Pulmonary vasculature is normal. No consolidation, pleural effusion, or pneumothorax. No acute osseous abnormalities are seen. Chronic widening of right acromioclavicular joint. Bones are under mineralized. IMPRESSION: No acute abnormality. Electronically Signed   By: Jeb Levering M.D.   On:  07/12/2016 19:44   Dg Chest 2 View  Result Date: 06/30/2016 CLINICAL DATA:  Dysphagia and throat pain for 4 days. EXAM: CHEST  2 VIEW COMPARISON:  Chest radiographs 05/28/2016. CT 4 days prior 06/22/2016 FINDINGS: The cardiomediastinal contours are normal. Atherosclerosis of the thoracic aorta. Moderate hiatal hernia. Small right pleural effusion on recent abdominal CT not well seen radiographically. Pulmonary vasculature is normal. No consolidation or pneumothorax. The bones are under mineralized. Chronic change about the right shoulder. No acute osseous abnormalities are seen. IMPRESSION: 1. No acute abnormality.  Thoracic aortic atherosclerosis. 2. Hiatal hernia. Recent abdominal CT demonstrated esophageal thickening of the distal esophagus, possibly cause of patient's dysphasia. Electronically Signed   By: Jeb Levering M.D.   On: 06/30/2016 22:28   Dg Abd 1 View  Result Date: 07/18/2016 CLINICAL DATA:  Nausea, vomiting. EXAM: ABDOMEN - 1 VIEW COMPARISON:  CT scan of July 13, 2016. FINDINGS: The bowel gas pattern is normal. Mild amount of stool is seen in the colon. Phleboliths are noted in the pelvis. IMPRESSION: No evidence of bowel obstruction or ileus. Electronically Signed   By: Marijo Conception, M.D.   On: 07/18/2016 13:22   Ct Head Wo Contrast  Result Date: 07/12/2016 CLINICAL DATA:  Generalized weakness with nausea and vomiting. EXAM: CT HEAD WITHOUT CONTRAST TECHNIQUE: Contiguous axial images were obtained from the base of the skull through the vertex without intravenous contrast. COMPARISON:  Brain MRI 04/19/2016.  Head CT 04/18/2016 FINDINGS: Brain: No evidence of acute infarction, hemorrhage, hydrocephalus, extra-axial collection or mass lesion/mass effect. Mild to moderate for age atrophy, most prominent in the frontal lobes. 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 Extensive chronic microvascular ischemic gliosis in the cerebral white matter. Vascular:  Atherosclerotic calcification.  No hyperdense vessel. Skull: No acute finding Sinuses/Orbits: No acute finding.  Bilateral cataract resection. IMPRESSION: 1. No acute finding or change from prior. 2. Chronic small vessel ischemic injury. Electronically Signed   By: Monte Fantasia M.D.   On: 07/12/2016 20:21     Assessment and Recommendation  83 y.o. female with the essential hypertension mixed hyperlipidemia history of pulmonary embolism diabetes without complications having nausea vomiting and new onset of paroxysmal nonvalvular atrial fibrillation with rapid ventricular rate now converted to normal sinus rhythm with appropriate therapy and no current evidence of myocardial infarction 1. Continue amiodarone and change to 200 mg orally for maintenance of normal sinus rhythm 2. Continue anticoagulation for further risk reduction in stroke with atrial fibrillation and recurrent pulmonary embolism at 2.5 mg twice per day 3. Amlodipine lisinopril for hypertension control and renal protection from diabetes 4. Avoid beta blocker at this time due to concerns of bradycardia with use of amiodarone 5. Begin ambulation and follow for improvements of symptoms and need  for adjustments of medication but okay for discharge home from cardiac standpoint with follow-up next week  Signed, Serafina Royals M.D. FACC

## 2016-07-23 NOTE — Evaluation (Signed)
Physical Therapy Evaluation Patient Details Name: Sherry [name] MRN: [id number] DOB: 1927/04/09 Today's Date: 07/23/2016   History of Present Illness  83 yo female with fib and RVR was referred to PT, noted chest pain and weakness.  Has had V tach with no cardioversion needed, and medicated to control A-fib.  Repleted K+, stabilized to admit after recent SNF stay.  PMHx: DM, HLD, PE, HTN, a-fib  Clinical Impression  Pt is up to walk with PT assisting her and noted her weakness but able to maneuver.  Her pulses were high, fatiguing her and causing her to state she does not feel right at some point.  Her measure of pulse on finger pulse ox was 191 but telemetry 131 once sitting.  Talked with nursing and noted her measures were probably not that high but were likely very elevated on the hall.  Follow acutely for monitoring of vitals and to increase her endurance with gait.    Follow Up Recommendations Home health PT    Equipment Recommendations  None recommended by PT    Recommendations for Other Services       Precautions / Restrictions Precautions Precautions: Fall (telemetry) Precaution Comments: monitor HR Restrictions Weight Bearing Restrictions: No      Mobility  Bed Mobility Overal bed mobility: Needs Assistance Bed Mobility: Supine to Sit;Sit to Supine     Supine to sit: Min assist Sit to supine: Min assist   General bed mobility comments: minimal trunk support to sit up and return to bed  Transfers Overall transfer level: Needs assistance Equipment used: Rolling walker (2 wheeled);1 person hand held assist Transfers: Sit to/from Stand Sit to Stand: Min guard         General transfer comment: no outright LOB but fatiguing with the effort to get up to walk  Ambulation/Gait Ambulation/Gait assistance: Min guard Ambulation Distance (Feet): 80 Feet Assistive device: Rolling walker (2 wheeled) Gait Pattern/deviations: Step-through pattern;Narrow base of  support;Trunk flexed;Decreased stride length Gait velocity: reduced Gait velocity interpretation: Below normal speed for age/gender General Gait Details: had pt wear pulse oximeter to walk and noted her pulse read 191 on portable, but was 131 when she returned to room and checked telemetry  Stairs            Wheelchair Mobility    Modified Rankin (Stroke Patients Only)       Balance Overall balance assessment: Needs assistance Sitting-balance support: Feet supported Sitting balance-Leahy Scale: Good     Standing balance support: Bilateral upper extremity supported Standing balance-Leahy Scale: Fair                               Pertinent Vitals/Pain Pain Assessment: No/denies pain    Home Living Family/patient expects to be discharged to:: Private residence Living Arrangements: Children (lives with her son) Available Help at Discharge: Family;Available 24 hours/day Type of Home: Apartment Home Access: Level entry     Home Layout: One level Home Equipment: Walker - 2 wheels;Hospital bed;Shower seat;Wheelchair - manual;Bedside commode Additional Comments: had recent admission to [facility]    Prior Function Level of Independence: Independent with assistive device(s)         Comments: Had been on RW and has son helping care for house     Hand Dominance   Dominant Hand: Right    Extremity/Trunk Assessment   Upper Extremity Assessment Upper Extremity Assessment: Overall WFL for tasks assessed  Lower Extremity Assessment Lower Extremity Assessment: Generalized weakness    Cervical / Trunk Assessment Cervical / Trunk Assessment: Kyphotic  Communication   Communication: HOH  Cognition Arousal/Alertness: Awake/alert Behavior During Therapy: WFL for tasks assessed/performed Overall Cognitive Status: Within Functional Limits for tasks assessed                                 General Comments: Pt is controlling walker  better, following instructions       General Comments      Exercises     Assessment/Plan    PT Assessment Patient needs continued PT services  PT Problem List Decreased strength;Decreased range of motion;Decreased activity tolerance;Decreased balance;Decreased mobility;Cardiopulmonary status limiting activity;Decreased coordination       PT Treatment Interventions DME instruction;Gait training;Functional mobility training;Therapeutic activities;Therapeutic exercise;Balance training;Neuromuscular re-education;Patient/family education    PT Goals (Current goals can be found in the Care Plan section)  Acute Rehab PT Goals Patient Stated Goal: to get stronger and get home PT Goal Formulation: With patient Time For Goal Achievement: 07/30/16 Potential to Achieve Goals: Good    Frequency Min 2X/week   Barriers to discharge   son is available to assist pt at all times    Co-evaluation               AM-PAC PT "6 Clicks" Daily Activity  Outcome Measure Difficulty turning over in bed (including adjusting bedclothes, sheets and blankets)?: None Difficulty moving from lying on back to sitting on the side of the bed? : A Little Difficulty sitting down on and standing up from a chair with arms (e.g., wheelchair, bedside commode, etc,.)?: A Little Help needed moving to and from a bed to chair (including a wheelchair)?: A Little Help needed walking in hospital room?: A Little Help needed climbing 3-5 steps with a railing? : A Little 6 Click Score: 19    End of Session Equipment Utilized During Treatment: Gait belt Activity Tolerance: Patient limited by fatigue;Patient tolerated treatment well (pulses very elevated with effort) Patient left: with call bell/phone within reach;in bed Nurse Communication: Mobility status PT Visit Diagnosis: Muscle weakness (generalized) (M62.81);Difficulty in walking, not elsewhere classified (R26.2)    Time: 1610-9604 PT Time Calculation (min)  (ACUTE ONLY): 25 min   Charges:   PT Evaluation $PT Eval Low Complexity: 1 Procedure PT Treatments $Gait Training: 8-22 mins   PT G Codes:   PT G-Codes **NOT FOR INPATIENT CLASS** Functional Assessment Tool Used: AM-PAC 6 Clicks Basic Mobility    Ramond Dial 07/23/2016, 2:00 PM   Mee Hives, PT MS Acute Rehab Dept. Number: [place] and [place]

## 2016-07-24 ENCOUNTER — Inpatient Hospital Stay: Admission: RE | Admit: 2016-07-24 | Payer: Medicare Other | Source: Ambulatory Visit

## 2016-07-24 DIAGNOSIS — I480 Paroxysmal atrial fibrillation: Secondary | ICD-10-CM | POA: Diagnosis not present

## 2016-07-24 DIAGNOSIS — E876 Hypokalemia: Secondary | ICD-10-CM | POA: Diagnosis not present

## 2016-07-24 LAB — BASIC METABOLIC PANEL
Anion gap: 5 (ref 5–15)
BUN: 20 mg/dL (ref 6–20)
CHLORIDE: 99 mmol/L — AB (ref 101–111)
CO2: 30 mmol/L (ref 22–32)
CREATININE: 1.07 mg/dL — AB (ref 0.44–1.00)
Calcium: 8 mg/dL — ABNORMAL LOW (ref 8.9–10.3)
GFR calc Af Amer: 53 mL/min — ABNORMAL LOW (ref >60)
GFR calc non Af Amer: 46 mL/min — ABNORMAL LOW (ref >60)
GLUCOSE: 54 mg/dL — AB (ref 65–99)
Potassium: 4 mmol/L (ref 3.5–5.1)
SODIUM: 134 mmol/L — AB (ref 135–145)

## 2016-07-24 LAB — GLUCOSE, CAPILLARY
Glucose-Capillary: 55 mg/dL — ABNORMAL LOW (ref 65–99)
Glucose-Capillary: 73 mg/dL (ref 65–99)
Glucose-Capillary: 143 mg/dL — ABNORMAL HIGH (ref 65–99)

## 2016-07-24 LAB — TROPONIN I: TROPONIN I: 0.1 ng/mL — AB (ref <0.03)

## 2016-07-24 MED ORDER — METOPROLOL TARTRATE 25 MG PO TABS
25.0000 mg | ORAL_TABLET | Freq: Two times a day (BID) | ORAL | Status: DC
  Administered 2016-07-24: 25 mg via ORAL
  Filled 2016-07-24: qty 1

## 2016-07-24 MED ORDER — METOPROLOL TARTRATE 25 MG PO TABS: 25.0000 mg | ORAL_TABLET | Freq: Two times a day (BID) | ORAL | 2 refills | Status: DC

## 2016-07-24 MED ORDER — AMIODARONE HCL 200 MG PO TABS: 200.0000 mg | ORAL_TABLET | Freq: Two times a day (BID) | ORAL | 2 refills | Status: DC

## 2016-07-24 NOTE — Care Management Important Message (Signed)
Important Message  Patient Details  Name: Sherry [name] MRN: [id number] Date of Birth: October 11, 1927   Medicare Important Message Given:  N/A - LOS <3 / Initial given by admissions    Katrina Stack, RN 07/24/2016, 11:24 AM

## 2016-07-24 NOTE — Care Management (Addendum)
Requested HRI  due to readmission for atrial fib with rvr requiring amiodarone continuous infusion.  Chronic Eliquis.  Notified [facility] and Palliative of readmisison

## 2016-07-24 NOTE — Progress Notes (Addendum)
Discharge instructions explained to pt / verbalized an understanding/ iv and tele removed/ transported off unit via wheelchair. Home meds returned to pt.

## 2016-07-24 NOTE — Plan of Care (Signed)
Problem: Cardiac: Goal: Ability to achieve and maintain adequate cardiopulmonary perfusion will improve Outcome: Progressing NSR maintained without ectopics.

## 2016-07-24 NOTE — Discharge Summary (Signed)
[place] [facility] [place] NAME: Sherry [name]    MR#:  [id number]  DATE OF BIRTH:  Mar 28, 1927  DATE OF ADMISSION:  07/22/2016   ADMITTING PHYSICIAN: Harrie Foreman, MD  DATE OF DISCHARGE:  07/24/2016  PRIMARY CARE PHYSICIAN: [facility]   ADMISSION DIAGNOSIS:   Hypokalemia [E87.6] Elevated troponin I level [R74.8] Cardiac arrhythmia, unspecified cardiac arrhythmia type [I49.9] Chest pain, unspecified type [R07.9] Nausea and vomiting, intractability of vomiting not specified, unspecified vomiting type [R11.2]  DISCHARGE DIAGNOSIS:   Active Problems:   Atrial fibrillation with RVR ([place])   SECONDARY DIAGNOSIS:   Past Medical History:  Diagnosis Date  . Diabetes mellitus without complication ([place])   . Hyperlipidemia   . Hypertension   . Pulmonary embolism [facility])     HOSPITAL COURSE:   83 year old female with past medical history significant for pulmonary embolism on eliquis, hypertension, diabetes and hyperlipidemia presents to the hospital with nausea and noted to be in A. fib.  #1 atrial fibrillation with RVR-likely triggered by her nausea and hypokalemia. -patient converted to normal sinus rhythm, amiodarone drip changed to oral amiodarone. - low-dose metoprolol added as well. That helped control the heart rate better. -Appreciate cardiology consult.  #2 Nausea-likely viral gastroenteritis. Much improved now. Monitor. -Able to tolerate normal diet now  #3 pulmonary embolism-continue eliquis. Not on home oxygen. Weaned off oxygen.  #4 diabetes mellitus-on Levemir, tresiba  #5 hypertension-continue lisinopril, metoprolol and Norvasc. Hold hydrochlorothiazide at discharge due to electrolyte abnormalities on admission.   Ambulated well. Will be discharged today with home health  DISCHARGE CONDITIONS:   Guarded CONSULTS OBTAINED:   Treatment Team:  Corey Skains, MD  DRUG ALLERGIES:   No  Known Allergies DISCHARGE MEDICATIONS:   Allergies as of 07/24/2016   No Known Allergies     Medication List    STOP taking these medications   feeding supplement (PRO-STAT SUGAR FREE 64) Liqd   lisinopril-hydrochlorothiazide 20-12.5 MG tablet Commonly known as:  PRINZIDE,ZESTORETIC     TAKE these medications   amiodarone 200 MG tablet Commonly known as:  PACERONE Take 1 tablet (200 mg total) by mouth 2 (two) times daily. Change to q daily in 2 weeks   amLODipine 5 MG tablet Commonly known as:  NORVASC Take 5 mg by mouth daily.   apixaban 2.5 MG Tabs tablet Commonly known as:  ELIQUIS Take 1 tablet (2.5 mg total) by mouth 2 (two) times daily. What changed:  medication strength  how much to take   atorvastatin 20 MG tablet Commonly known as:  LIPITOR Take 20 mg by mouth at bedtime.   diphenhydrAMINE 25 MG tablet Commonly known as:  BENADRYL Take 25 mg by mouth daily as needed for itching or allergies.   ferrous sulfate 325 (65 FE) MG tablet Take 1 tablet (325 mg total) by mouth daily with breakfast.   gabapentin 300 MG capsule Commonly known as:  NEURONTIN Take 300 mg by mouth at bedtime.   Insulin Detemir 100 UNIT/ML Pen Commonly known as:  LEVEMIR Inject 8 Units into the skin every morning.   lisinopril 20 MG tablet Commonly known as:  PRINIVIL,ZESTRIL Take 1 tablet (20 mg total) by mouth daily.   magnesium oxide 400 (241.3 Mg) MG tablet Commonly known as:  MAG-OX Take 1 tablet (400 mg total) by mouth daily.   meloxicam 7.5 MG tablet Commonly known as:  MOBIC Take 7.5 mg by mouth daily.   metoCLOPramide 5  MG tablet Commonly known as:  REGLAN Take 1 tablet (5 mg total) by mouth 2 (two) times daily.   metoprolol tartrate 25 MG tablet Commonly known as:  LOPRESSOR Take 1 tablet (25 mg total) by mouth 2 (two) times daily.   multivitamin with minerals Tabs tablet Take 1 tablet by mouth daily.   ondansetron 4 MG disintegrating tablet Commonly  known as:  ZOFRAN-ODT Take 4 mg by mouth every 6 (six) hours as needed for nausea or vomiting.   pantoprazole 40 MG tablet Commonly known as:  PROTONIX Take 1 tablet (40 mg total) by mouth 2 (two) times daily. What changed:  when to take this   polyethylene glycol packet Commonly known as:  MIRALAX Take 17 g by mouth daily as needed.   sertraline 25 MG tablet Commonly known as:  ZOLOFT Take 1 tablet (25 mg total) by mouth daily.   sucralfate 1 GM/10ML suspension Commonly known as:  CARAFATE Take 10 mLs (1 g total) by mouth 4 (four) times daily -  with meals and at bedtime.   tamsulosin 0.4 MG Caps capsule Commonly known as:  FLOMAX Take 1 capsule (0.4 mg total) by mouth daily. What changed:  when to take this   TRESIBA FLEXTOUCH 100 UNIT/ML Sopn FlexTouch Pen Generic drug:  insulin degludec Inject 10 Units into the skin every morning.        DISCHARGE INSTRUCTIONS:   1. PCP follow-up in 1-2 weeks 2. Cardiology follow-up in 1 week  DIET:   Cardiac diet  ACTIVITY:   Activity as tolerated  OXYGEN:   Home Oxygen: No.  Oxygen Delivery: room air  DISCHARGE LOCATION:   home   If you experience worsening of your admission symptoms, develop shortness of breath, life threatening emergency, suicidal or homicidal thoughts you must seek medical attention immediately by calling 911 or calling your MD immediately  if symptoms less severe.  You Must read complete instructions/literature along with all the possible adverse reactions/side effects for all the Medicines you take and that have been prescribed to you. Take any new Medicines after you have completely understood and accpet all the possible adverse reactions/side effects.   Please note  You were cared for by a hospitalist during your hospital stay. If you have any questions about your discharge medications or the care you received while you were in the hospital after you are discharged, you can call the unit and  asked to speak with the hospitalist on call if the hospitalist that took care of you is not available. Once you are discharged, your primary care physician will handle any further medical issues. Please note that NO REFILLS for any discharge medications will be authorized once you are discharged, as it is imperative that you return to your primary care physician (or establish a relationship with a primary care physician if you do not have one) for your aftercare needs so that they can reassess your need for medications and monitor your lab values.    On the day of Discharge:  VITAL SIGNS:   Blood pressure 119/80, pulse 74, temperature 98.1 F (36.7 C), temperature source Oral, resp. rate 18, height 5\' 4"  (1.626 m), weight 59.8 kg (131 lb 14.4 oz), SpO2 94 %.  PHYSICAL EXAMINATION:   GENERAL:  83 y.o.-year-old elderly patient lying in the bed with no acute distress.  EYES: Pupils equal, round, reactive to light and accommodation. No scleral icterus. Extraocular muscles intact.  HEENT: Head atraumatic, normocephalic. Oropharynx and nasopharynx clear.  NECK:  Supple, no jugular venous distention. No thyroid enlargement, no tenderness.  LUNGS: Normal breath sounds bilaterally, no wheezing, rales,rhonchi or crepitation. No use of accessory muscles of respiration.  CARDIOVASCULAR: S1, S2 normal. No rubs, or gallops. 2/6 systolic murmur is present ABDOMEN: Soft, nontender, nondistended. Bowel sounds present. No organomegaly or mass.  EXTREMITIES: No pedal edema, cyanosis, or clubbing.  NEUROLOGIC: Cranial nerves II through XII are intact. Muscle strength 5/5 in all extremities. Sensation intact. Gait not checked.  PSYCHIATRIC: The patient is alert and oriented x 3.  SKIN: No obvious rash, lesion, or ulcer.   DATA REVIEW:   CBC  Recent Labs Lab 07/22/16 0017  WBC 8.1  HGB 13.4  HCT 39.7  PLT 358    Chemistries   Recent Labs Lab 07/22/16 0034  07/24/16 0534  NA 136  < > 134*  K  2.8*  < > 4.0  CL 96*  < > 99*  CO2 28  < > 30  GLUCOSE 122*  < > 54*  BUN 10  < > 20  CREATININE 0.86  < > 1.07*  CALCIUM 8.6*  < > 8.0*  MG 3.2*  --   --   AST 19  --   --   ALT 8*  --   --   ALKPHOS 58  --   --   BILITOT 0.8  --   --   < > = values in this interval not displayed.   Microbiology Results  Results for orders placed or performed during the hospital encounter of 05/28/16  Culture, Urine     Status: Abnormal   Collection Time: 05/29/16 11:52 PM  Result Value Ref Range Status   Specimen Description URINE, RANDOM  Final   Special Requests NONE  Final   Culture MULTIPLE SPECIES PRESENT, SUGGEST RECOLLECTION (A)  Final   Report Status 05/31/2016 FINAL  Final    RADIOLOGY:  No results found.   Management plans discussed with the patient, family and they are in agreement.  CODE STATUS:     Code Status Orders        Start     Ordered   07/22/16 0413  Full code  Continuous     07/22/16 0413    Code Status History    Date Active Date Inactive Code Status Order ID Comments User Context   07/12/2016 10:45 PM 07/18/2016  8:32 PM Full Code [id number]  Hugelmeyer, Alexis, DO Inpatient   05/28/2016 12:01 PM 06/02/2016  7:32 PM Full Code [id number]  Lorella Nimrod, MD Inpatient   04/24/2016  7:27 PM 04/27/2016  8:08 PM Full Code [id number]  Loletha Grayer, MD ED   04/19/2016 12:50 AM 04/22/2016 12:10 AM Full Code [id number]  Lance Coon, MD Inpatient   03/27/2016  9:39 PM 03/29/2016  9:41 PM Full Code [id number]  Fritzi Mandes, MD ED   03/12/2016 12:21 PM 03/13/2016  8:32 PM Full Code [id number]  Dustin Flock, MD Inpatient   03/09/2016  4:33 PM 03/12/2016 12:21 PM DNR [id number]  Nicholes Mango, MD ED   02/26/2016  9:39 PM 03/01/2016 10:37 PM DNR [id number]  Karmen Bongo, MD Inpatient   02/26/2016  7:48 PM 02/26/2016  9:39 PM DNR [id number]  Dorie Rank, MD ED   01/31/2016  5:45 PM 02/07/2016 11:45 PM DNR [id number]  Flora Lipps, MD Inpatient   01/30/2016 11:19 PM 01/31/2016  5:44 PM Full Code  [id number]  [place], Ubaldo Glassing, DO Inpatient   11/26/2014  3:52 PM 11/28/2014  3:07  PM Full Code [id number]  Max Sane, MD Inpatient   11/26/2014  8:59 AM 11/26/2014  3:52 PM Full Code [id number]  Max Sane, MD ED      TOTAL TIME TAKING CARE OF THIS PATIENT: 38 minutes.    Gladstone Lighter M.D on 07/24/2016 at 1:52 PM  Between 7am to 6pm - Pager - [phone]  After 6pm go to [email or web address] - [employer] [facility]  Office  [phone]  CC: Primary care physician; [facility]   Note: This dictation was prepared with Dragon dictation along with smaller phrase technology. Any transcriptional errors that result from this process are unintentional.

## 2016-07-24 NOTE — Care Management (Signed)
Notified [facility] and palliative care of discharge.   REceived approval for hri

## 2016-07-24 NOTE — Progress Notes (Signed)
   07/24/16 0935  Clinical Encounter Type  Visited With Patient  Visit Type Initial;Spiritual support  Referral From Nurse  Spiritual Encounters  Spiritual Needs Prayer   [place] offered prayer and emotional support to the patient. Patient is hopeful she will be discharged today. Pateint was very pleasant. Harper will follow-up at a later time.

## 2016-07-24 NOTE — Telephone Encounter (Signed)
LMOM

## 2016-07-25 ENCOUNTER — Inpatient Hospital Stay: Admission: RE | Admit: 2016-07-25 | Payer: Medicare Other | Source: Ambulatory Visit

## 2016-07-25 LAB — HEMOGLOBIN A1C
Hgb A1c MFr Bld: 6.9 % — ABNORMAL HIGH (ref 4.8–5.6)
Mean Plasma Glucose: 151 mg/dL

## 2016-07-25 NOTE — Telephone Encounter (Signed)
LMOM

## 2016-07-26 NOTE — Telephone Encounter (Signed)
Pt returned call requesting pre-admit testing appt be rescheduled. Discussed with pt that due to recent hospitalization she will need cardiac clearance from Dr Nehemiah Massed prior to surgery. Pt states she has appt with Dr Nehemiah Massed on 08/07/16 @10 :00. Pre-admit testing appt made for 08/07/16 @12 :00 & surgery rescheduled to 08/11/16. Pt aware & voices understanding.

## 2016-07-28 ENCOUNTER — Other Ambulatory Visit: Payer: Self-pay

## 2016-07-28 ENCOUNTER — Encounter: Payer: Self-pay | Admitting: Emergency Medicine

## 2016-07-28 ENCOUNTER — Observation Stay
Admission: EM | Admit: 2016-07-28 | Discharge: 2016-08-02 | Disposition: A | Discharge status: Home / Self Care | Payer: Medicare Other | Attending: Internal Medicine | Admitting: Internal Medicine

## 2016-07-28 ENCOUNTER — Emergency Department: Payer: Medicare Other

## 2016-07-28 DIAGNOSIS — E785 Hyperlipidemia, unspecified: Secondary | ICD-10-CM | POA: Diagnosis not present

## 2016-07-28 DIAGNOSIS — I4892 Unspecified atrial flutter: Secondary | ICD-10-CM | POA: Diagnosis not present

## 2016-07-28 DIAGNOSIS — I491 Atrial premature depolarization: Secondary | ICD-10-CM | POA: Diagnosis not present

## 2016-07-28 DIAGNOSIS — E860 Dehydration: Secondary | ICD-10-CM | POA: Diagnosis not present

## 2016-07-28 DIAGNOSIS — M545 Low back pain: Secondary | ICD-10-CM | POA: Diagnosis not present

## 2016-07-28 DIAGNOSIS — K5641 Fecal impaction: Principal | ICD-10-CM | POA: Diagnosis present

## 2016-07-28 DIAGNOSIS — F0390 Unspecified dementia without behavioral disturbance: Secondary | ICD-10-CM | POA: Diagnosis not present

## 2016-07-28 DIAGNOSIS — E1122 Type 2 diabetes mellitus with diabetic chronic kidney disease: Secondary | ICD-10-CM | POA: Insufficient documentation

## 2016-07-28 DIAGNOSIS — I10 Essential (primary) hypertension: Secondary | ICD-10-CM | POA: Diagnosis present

## 2016-07-28 DIAGNOSIS — J90 Pleural effusion, not elsewhere classified: Secondary | ICD-10-CM | POA: Insufficient documentation

## 2016-07-28 DIAGNOSIS — I700 Atherosclerosis of aorta: Secondary | ICD-10-CM | POA: Insufficient documentation

## 2016-07-28 DIAGNOSIS — I447 Left bundle-branch block, unspecified: Secondary | ICD-10-CM | POA: Insufficient documentation

## 2016-07-28 DIAGNOSIS — D631 Anemia in chronic kidney disease: Secondary | ICD-10-CM | POA: Diagnosis not present

## 2016-07-28 DIAGNOSIS — E876 Hypokalemia: Secondary | ICD-10-CM | POA: Insufficient documentation

## 2016-07-28 DIAGNOSIS — Z7901 Long term (current) use of anticoagulants: Secondary | ICD-10-CM | POA: Diagnosis not present

## 2016-07-28 DIAGNOSIS — R112 Nausea with vomiting, unspecified: Secondary | ICD-10-CM | POA: Diagnosis present

## 2016-07-28 DIAGNOSIS — G8929 Other chronic pain: Secondary | ICD-10-CM | POA: Insufficient documentation

## 2016-07-28 DIAGNOSIS — K210 Gastro-esophageal reflux disease with esophagitis: Secondary | ICD-10-CM | POA: Diagnosis not present

## 2016-07-28 DIAGNOSIS — I129 Hypertensive chronic kidney disease with stage 1 through stage 4 chronic kidney disease, or unspecified chronic kidney disease: Secondary | ICD-10-CM | POA: Diagnosis not present

## 2016-07-28 DIAGNOSIS — R109 Unspecified abdominal pain: Secondary | ICD-10-CM

## 2016-07-28 DIAGNOSIS — K449 Diaphragmatic hernia without obstruction or gangrene: Secondary | ICD-10-CM | POA: Diagnosis not present

## 2016-07-28 DIAGNOSIS — N183 Chronic kidney disease, stage 3 unspecified: Secondary | ICD-10-CM | POA: Diagnosis present

## 2016-07-28 DIAGNOSIS — Z79899 Other long term (current) drug therapy: Secondary | ICD-10-CM | POA: Insufficient documentation

## 2016-07-28 DIAGNOSIS — E119 Type 2 diabetes mellitus without complications: Secondary | ICD-10-CM

## 2016-07-28 DIAGNOSIS — I2699 Other pulmonary embolism without acute cor pulmonale: Secondary | ICD-10-CM | POA: Insufficient documentation

## 2016-07-28 DIAGNOSIS — R110 Nausea: Secondary | ICD-10-CM

## 2016-07-28 DIAGNOSIS — Z794 Long term (current) use of insulin: Secondary | ICD-10-CM | POA: Diagnosis not present

## 2016-07-28 DIAGNOSIS — F329 Major depressive disorder, single episode, unspecified: Secondary | ICD-10-CM | POA: Insufficient documentation

## 2016-07-28 DIAGNOSIS — K567 Ileus, unspecified: Secondary | ICD-10-CM

## 2016-07-28 DIAGNOSIS — K5904 Chronic idiopathic constipation: Secondary | ICD-10-CM

## 2016-07-28 DIAGNOSIS — I482 Chronic atrial fibrillation: Secondary | ICD-10-CM | POA: Diagnosis not present

## 2016-07-28 DIAGNOSIS — Z86711 Personal history of pulmonary embolism: Secondary | ICD-10-CM | POA: Insufficient documentation

## 2016-07-28 LAB — LIPASE, BLOOD: LIPASE: 22 U/L (ref 11–51)

## 2016-07-28 LAB — COMPREHENSIVE METABOLIC PANEL
ALK PHOS: 79 U/L (ref 38–126)
ALT: 9 U/L — AB (ref 14–54)
ANION GAP: 6 (ref 5–15)
AST: 21 U/L (ref 15–41)
Albumin: 2.7 g/dL — ABNORMAL LOW (ref 3.5–5.0)
BUN: 16 mg/dL (ref 6–20)
CALCIUM: 8 mg/dL — AB (ref 8.9–10.3)
CHLORIDE: 103 mmol/L (ref 101–111)
CO2: 26 mmol/L (ref 22–32)
Creatinine, Ser: 1.01 mg/dL — ABNORMAL HIGH (ref 0.44–1.00)
GFR, EST AFRICAN AMERICAN: 57 mL/min — AB (ref >60)
GFR, EST NON AFRICAN AMERICAN: 49 mL/min — AB (ref >60)
Glucose, Bld: 205 mg/dL — ABNORMAL HIGH (ref 65–99)
Potassium: 3.3 mmol/L — ABNORMAL LOW (ref 3.5–5.1)
SODIUM: 135 mmol/L (ref 135–145)
Total Bilirubin: 0.6 mg/dL (ref 0.3–1.2)
Total Protein: 5.5 g/dL — ABNORMAL LOW (ref 6.5–8.1)

## 2016-07-28 LAB — CBC
HCT: 36.9 % (ref 35.0–47.0)
HEMOGLOBIN: 12.2 g/dL (ref 12.0–16.0)
MCH: 27 pg (ref 26.0–34.0)
MCHC: 32.9 g/dL (ref 32.0–36.0)
MCV: 82.2 fL (ref 80.0–100.0)
PLATELETS: 306 10*3/uL (ref 150–440)
RBC: 4.5 MIL/uL (ref 3.80–5.20)
RDW: 14.9 % — ABNORMAL HIGH (ref 11.5–14.5)
WBC: 4.4 10*3/uL (ref 3.6–11.0)

## 2016-07-28 LAB — GLUCOSE, CAPILLARY: GLUCOSE-CAPILLARY: 247 mg/dL — AB (ref 65–99)

## 2016-07-28 LAB — TROPONIN I

## 2016-07-28 MED ORDER — PANTOPRAZOLE SODIUM 40 MG PO TBEC
40.0000 mg | DELAYED_RELEASE_TABLET | Freq: Every day | ORAL | Status: DC
  Administered 2016-07-29 – 2016-08-02 (×4): 40 mg via ORAL
  Filled 2016-07-28 (×5): qty 1

## 2016-07-28 MED ORDER — INSULIN ASPART 100 UNIT/ML ~~LOC~~ SOLN
0.0000 [IU] | Freq: Four times a day (QID) | SUBCUTANEOUS | Status: DC
  Administered 2016-07-28: 3 [IU] via SUBCUTANEOUS
  Filled 2016-07-28: qty 1

## 2016-07-28 MED ORDER — SERTRALINE HCL 25 MG PO TABS
25.0000 mg | ORAL_TABLET | Freq: Every day | ORAL | Status: DC
  Administered 2016-07-29 – 2016-08-02 (×4): 25 mg via ORAL
  Filled 2016-07-28 (×5): qty 1

## 2016-07-28 MED ORDER — SORBITOL 70 % SOLN
960.0000 mL | TOPICAL_OIL | Freq: Once | ORAL | Status: AC
  Administered 2016-07-29: 960 mL via RECTAL
  Filled 2016-07-28: qty 240

## 2016-07-28 MED ORDER — FENTANYL CITRATE (PF) 100 MCG/2ML IJ SOLN
50.0000 ug | Freq: Once | INTRAMUSCULAR | Status: AC
  Administered 2016-07-28: 50 ug via INTRAVENOUS
  Filled 2016-07-28: qty 2

## 2016-07-28 MED ORDER — MINERAL OIL RE ENEM
1.0000 | ENEMA | Freq: Once | RECTAL | Status: AC
  Administered 2016-07-28: 1 via RECTAL

## 2016-07-28 MED ORDER — ONDANSETRON HCL 4 MG/2ML IJ SOLN
4.0000 mg | Freq: Four times a day (QID) | INTRAMUSCULAR | Status: DC | PRN
Start: 2016-07-28 — End: 2016-08-02
  Administered 2016-07-28 – 2016-07-31 (×4): 4 mg via INTRAVENOUS
  Filled 2016-07-28 (×5): qty 2

## 2016-07-28 MED ORDER — ACETAMINOPHEN 650 MG RE SUPP: 650.0000 mg | Freq: Four times a day (QID) | RECTAL | Status: DC | PRN

## 2016-07-28 MED ORDER — SODIUM CHLORIDE 0.9 % IV SOLN
INTRAVENOUS | Status: DC
  Administered 2016-07-28: 21:00:00 via INTRAVENOUS

## 2016-07-28 MED ORDER — METOPROLOL TARTRATE 25 MG PO TABS
25.0000 mg | ORAL_TABLET | Freq: Two times a day (BID) | ORAL | Status: DC
  Administered 2016-07-28 – 2016-08-02 (×7): 25 mg via ORAL
  Filled 2016-07-28 (×9): qty 1

## 2016-07-28 MED ORDER — ONDANSETRON HCL 4 MG PO TABS: 4.0000 mg | ORAL_TABLET | Freq: Four times a day (QID) | ORAL | Status: DC | PRN

## 2016-07-28 MED ORDER — AMIODARONE HCL 200 MG PO TABS
200.0000 mg | ORAL_TABLET | Freq: Two times a day (BID) | ORAL | Status: DC
  Administered 2016-07-28 – 2016-08-02 (×8): 200 mg via ORAL
  Filled 2016-07-28 (×10): qty 1

## 2016-07-28 MED ORDER — ACETAMINOPHEN 325 MG PO TABS: 650.0000 mg | ORAL_TABLET | Freq: Four times a day (QID) | ORAL | Status: DC | PRN

## 2016-07-28 MED ORDER — ONDANSETRON HCL 4 MG/2ML IJ SOLN
INTRAMUSCULAR | Status: AC
  Administered 2016-07-29: 4 mg
  Filled 2016-07-28: qty 2

## 2016-07-28 MED ORDER — ONDANSETRON HCL 4 MG/2ML IJ SOLN
4.0000 mg | Freq: Once | INTRAMUSCULAR | Status: AC
  Administered 2016-07-28: 4 mg via INTRAVENOUS
  Filled 2016-07-28: qty 2

## 2016-07-28 MED ORDER — AMLODIPINE BESYLATE 5 MG PO TABS
5.0000 mg | ORAL_TABLET | Freq: Every day | ORAL | Status: DC
  Administered 2016-07-29 – 2016-08-02 (×5): 5 mg via ORAL
  Filled 2016-07-28 (×6): qty 1

## 2016-07-28 MED ORDER — APIXABAN 2.5 MG PO TABS
2.5000 mg | ORAL_TABLET | Freq: Two times a day (BID) | ORAL | Status: DC
  Administered 2016-07-28 – 2016-08-02 (×8): 2.5 mg via ORAL
  Filled 2016-07-28 (×10): qty 1

## 2016-07-28 MED ORDER — LISINOPRIL 20 MG PO TABS
20.0000 mg | ORAL_TABLET | Freq: Every day | ORAL | Status: DC
  Administered 2016-07-29 – 2016-08-02 (×5): 20 mg via ORAL
  Filled 2016-07-28 (×6): qty 1

## 2016-07-28 NOTE — H&P (Signed)
[facility] [place] NAME: Sherry [name]    MR#:  [id number]  DATE OF BIRTH:  1927-12-01  DATE OF ADMISSION:  07/28/2016  PRIMARY CARE PHYSICIAN: [facility]   REQUESTING/REFERRING PHYSICIAN: Burlene Arnt, MD  CHIEF COMPLAINT:   Chief Complaint  Patient presents with  . Abdominal Pain  . Constipation    HISTORY OF PRESENT ILLNESS:  Sherry [name]  is a 83 y.o. female who presents with Abdominal discomfort and reported constipation of 2 weeks' duration. Imaging here in the ED shows some fecal impaction and significant stool burden throughout her entire colon. Some disimpaction was able to be performed in the ED. Patient is actually vomiting feculent-appearing material. Hospitalists were called for admission  PAST MEDICAL HISTORY:   Past Medical History:  Diagnosis Date  . Diabetes mellitus without complication ([place])   . Hyperlipidemia   . Hypertension   . Pulmonary embolism ([place])     PAST SURGICAL HISTORY:   Past Surgical History:  Procedure Laterality Date  . APPENDECTOMY    . BACK SURGERY    . ESOPHAGOGASTRODUODENOSCOPY N/A 05/29/2016   Procedure: ESOPHAGOGASTRODUODENOSCOPY (EGD);  Surgeon: Clarene Essex, MD;  Location: [facility];  Service: Gastroenterology;  Laterality: N/A;  . SHOULDER SURGERY      SOCIAL HISTORY:   Social History  Substance Use Topics  . Smoking status: Never Smoker  . Smokeless tobacco: Never Used  . Alcohol use No    FAMILY HISTORY:   Family History  Problem Relation Age of Onset  . Diabetes Mother   . Cancer Mother   . Cancer Father   . Bladder Cancer Neg Hx   . Kidney cancer Neg Hx     DRUG ALLERGIES:  No Known Allergies  MEDICATIONS AT HOME:   Prior to Admission medications   Medication Sig Start Date End Date Taking? Authorizing Provider  amiodarone (PACERONE) 200 MG tablet Take 1 tablet (200 mg total) by mouth 2 (two) times daily. Change to q daily in 2 weeks  07/24/16  Yes Gladstone Lighter, MD  amLODipine (NORVASC) 5 MG tablet Take 5 mg by mouth daily.   Yes [provider]  apixaban (ELIQUIS) 2.5 MG TABS tablet Take 1 tablet (2.5 mg total) by mouth 2 (two) times daily. 07/23/16  Yes Gladstone Lighter, MD  atorvastatin (LIPITOR) 20 MG tablet Take 20 mg by mouth at bedtime.    Yes [provider]  diphenhydrAMINE (BENADRYL) 25 MG tablet Take 25 mg by mouth daily as needed for itching or allergies.   Yes [provider]  ferrous sulfate 325 (65 FE) MG tablet Take 1 tablet (325 mg total) by mouth daily with breakfast. 06/03/16  Yes Lorella Nimrod, MD  gabapentin (NEURONTIN) 300 MG capsule Take 300 mg by mouth at bedtime.    Yes [provider]  insulin degludec (TRESIBA FLEXTOUCH) 100 UNIT/ML SOPN FlexTouch Pen Inject 10 Units into the skin every morning.   Yes [provider]  Insulin Detemir (LEVEMIR) 100 UNIT/ML Pen Inject 8 Units into the skin every morning. 06/02/16  Yes Lorella Nimrod, MD  lisinopril (PRINIVIL,ZESTRIL) 20 MG tablet Take 1 tablet (20 mg total) by mouth daily. 07/24/16  Yes Gladstone Lighter, MD  magnesium oxide (MAG-OX) 400 (241.3 Mg) MG tablet Take 1 tablet (400 mg total) by mouth daily. 04/27/16  Yes Wieting, Richard, MD  meloxicam (MOBIC) 7.5 MG tablet Take 7.5 mg by mouth daily.   Yes [provider]  metoCLOPramide (  REGLAN) 5 MG tablet Take 1 tablet (5 mg total) by mouth 2 (two) times daily. 06/02/16  Yes Lorella Nimrod, MD  metoprolol tartrate (LOPRESSOR) 25 MG tablet Take 1 tablet (25 mg total) by mouth 2 (two) times daily. 07/24/16  Yes Gladstone Lighter, MD  Multiple Vitamin (MULTIVITAMIN WITH MINERALS) TABS tablet Take 1 tablet by mouth daily. 06/02/16  Yes Shela Leff, MD  ondansetron (ZOFRAN-ODT) 4 MG disintegrating tablet Take 4 mg by mouth every 6 (six) hours as needed for nausea or vomiting.   Yes [provider]  pantoprazole (PROTONIX) 40 MG tablet Take 1  tablet (40 mg total) by mouth 2 (two) times daily. Patient taking differently: Take 40 mg by mouth daily.  04/27/16  Yes Wieting, Richard, MD  polyethylene glycol [facility]) packet Take 17 g by mouth daily as needed. 04/27/16  Yes Wieting, Richard, MD  sertraline (ZOLOFT) 25 MG tablet Take 1 tablet (25 mg total) by mouth daily. 06/02/16  Yes Lorella Nimrod, MD  sucralfate (CARAFATE) 1 GM/10ML suspension Take 10 mLs (1 g total) by mouth 4 (four) times daily -  with meals and at bedtime. 06/02/16  Yes Lorella Nimrod, MD  tamsulosin (FLOMAX) 0.4 MG CAPS capsule Take 1 capsule (0.4 mg total) by mouth daily. Patient taking differently: Take 0.4 mg by mouth daily after lunch.  03/14/16  Yes Fritzi Mandes, MD    REVIEW OF SYSTEMS:  Review of Systems  Constitutional: Negative for chills, fever, malaise/fatigue and weight loss.  HENT: Negative for ear pain, hearing loss and tinnitus.   Eyes: Negative for blurred vision, double vision, pain and redness.  Respiratory: Negative for cough, hemoptysis and shortness of breath.   Cardiovascular: Negative for chest pain, palpitations, orthopnea and leg swelling.  Gastrointestinal: Positive for abdominal pain, constipation, nausea and vomiting. Negative for diarrhea.  Genitourinary: Negative for dysuria, frequency and hematuria.  Musculoskeletal: Negative for back pain, joint pain and neck pain.  Skin:       No acne, rash, or lesions  Neurological: Negative for dizziness, tremors, focal weakness and weakness.  Endo/Heme/Allergies: Negative for polydipsia. Does not bruise/bleed easily.  Psychiatric/Behavioral: Negative for depression. The patient is not nervous/anxious and does not have insomnia.      VITAL SIGNS:   Vitals:   07/28/16 1514 07/28/16 1517 07/28/16 1730  BP:  (!) 183/86 (!) 155/55  Pulse:  82 87  Resp:  20 18  SpO2:  99% 100%  Weight: 58.1 kg (128 lb)    Height: 5\' 4"  (1.626 m)     Wt Readings from Last 3 Encounters:  07/28/16 58.1 kg (128 lb)   07/24/16 59.8 kg (131 lb 14.4 oz)  07/12/16 54.9 kg (121 lb)    PHYSICAL EXAMINATION:  Physical Exam  Vitals reviewed. Constitutional: She is oriented to person, place, and time. She appears well-developed and well-nourished. She appears distressed.  HENT:  Head: Normocephalic and atraumatic.  Mouth/Throat: Oropharynx is clear and moist.  Eyes: Pupils are equal, round, and reactive to light. Conjunctivae and EOM are normal. No scleral icterus.  Neck: Normal range of motion. Neck supple. No JVD present. No thyromegaly present.  Cardiovascular: Normal rate, regular rhythm and intact distal pulses.  Exam reveals no gallop and no friction rub.   No murmur heard. Respiratory: Effort normal and breath sounds normal. No respiratory distress. She has no wheezes. She has no rales.  GI: Soft. She exhibits no distension. There is tenderness.  Musculoskeletal: Normal range of motion. She exhibits no edema.  No arthritis, no gout  Lymphadenopathy:    She has no cervical adenopathy.  Neurological: She is alert and oriented to person, place, and time. No cranial nerve deficit.  No dysarthria, no aphasia  Skin: Skin is warm and dry. No rash noted. No erythema.  Psychiatric: She has a normal mood and affect. Her behavior is normal. Judgment and thought content normal.    LABORATORY PANEL:   CBC  Recent Labs Lab 07/28/16 1515  WBC 4.4  HGB 12.2  HCT 36.9  PLT 306   ------------------------------------------------------------------------------------------------------------------  Chemistries   Recent Labs Lab 07/22/16 0034  07/28/16 1515  NA 136  < > 135  K 2.8*  < > 3.3*  CL 96*  < > 103  CO2 28  < > 26  GLUCOSE 122*  < > 205*  BUN 10  < > 16  CREATININE 0.86  < > 1.01*  CALCIUM 8.6*  < > 8.0*  MG 3.2*  --   --   AST 19  --  21  ALT 8*  --  9*  ALKPHOS 58  --  79  BILITOT 0.8  --  0.6  < > = values in this interval not  displayed. ------------------------------------------------------------------------------------------------------------------  Cardiac Enzymes  Recent Labs Lab 07/28/16 1515  TROPONINI <0.03   ------------------------------------------------------------------------------------------------------------------  RADIOLOGY:  Ct Abdomen Pelvis Wo Contrast  Result Date: 07/28/2016 CLINICAL DATA:  83 year old with low abdominal pain radiating to the back, constipation and vomiting for 2 weeks. EXAM: CT ABDOMEN AND PELVIS WITHOUT CONTRAST TECHNIQUE: Multidetector CT imaging of the abdomen and pelvis was performed following the standard protocol without IV contrast. COMPARISON:  CT 07/13/2016 and 06/22/2016.  Radiographs 07/18/2016. FINDINGS: Lower chest: Moderate size hiatal hernia has slightly enlarged compared with the prior study and is fluid-filled. There are new small right-greater-than-left pleural effusions with mild dependent atelectasis at both lung bases. Aortic and coronary artery atherosclerosis are present. Hepatobiliary: The liver is normal in density without focal abnormality. No evidence of gallstones, gallbladder wall thickening or biliary dilatation. Pancreas: Atrophied without focal abnormality or surrounding inflammation. Spleen: Normal in size without focal abnormality. Adrenals/Urinary Tract: Both adrenal glands appear normal. Persistent abnormal appearance of the right kidney with distention of the upper pole calices and pelvis, previously evaluated 06/22/2016. There are faint calcifications within the renal pelvis. The left kidney demonstrates no suspicious findings. The bladder appears unremarkable. Stomach/Bowel: No evidence of bowel wall thickening, distention or surrounding inflammatory change. There is large amount of stool throughout the colon, especially distally in the rectum. Vascular/Lymphatic: There are no enlarged abdominal or pelvic lymph nodes. Diffuse aortic and branch  vessel atherosclerosis. Reproductive: The uterus and adnexa appear unremarkable. Other: No evidence of abdominal wall mass or hernia. No ascites. Musculoskeletal: No acute or significant osseous findings. Chronic L3 compression fracture and chronic spondylosis at L4-5 are stable. IMPRESSION: 1. Large amount of stool throughout the colon consistent with constipation. No evidence of bowel obstruction or perforation. 2. New small bilateral pleural effusions with mild bibasilar atelectasis. 3. Moderate size fluid-filled hiatal hernia. 4. Persistent abnormal appearance of the right renal pelvis and upper pole calices worrisome for transitional carcinoma as described on previous studies. 5.  Aortic Atherosclerosis (ICD10-I70.0). Electronically Signed   By: Richardean Sale M.D.   On: 07/28/2016 16:33    EKG:   Orders placed or performed in visit on 07/28/16  . EKG 12-Lead    IMPRESSION AND PLAN:  Principal Problem:   Fecal impaction of colon ([place]) -  She was able to be manually disimpacted some in the ED, but CT shows colon full of stool. She had one glycerin enema so far without any bowel movement. We will give her another SMOG enema tonight. If she has not had significant bowel movement by tomorrow would recommend getting a gastroenterology consult Active Problems:   Diabetes mellitus type 2, insulin dependent (HCC) - sliding scale insulin with corresponding glucose checks   Essential hypertension - continue home meds   Chronic kidney disease (CKD), stage III (moderate) - stable, avoid nephrotoxins and monitor   HLD (hyperlipidemia) - continue home meds  All the records are reviewed and case discussed with ED provider. Management plans discussed with the patient and/or family.  DVT PROPHYLAXIS: Systemic anticoagulation  GI PROPHYLAXIS: PPI  ADMISSION STATUS: Observation  CODE STATUS: Full Code Status History    Date Active Date Inactive Code Status Order ID Comments User Context   07/22/2016   4:13 AM 07/24/2016  5:43 PM Full Code [id number]  Harrie Foreman, MD Inpatient   07/12/2016 10:45 PM 07/18/2016  8:32 PM Full Code [id number]  Hugelmeyer, Alexis, DO Inpatient   05/28/2016 12:01 PM 06/02/2016  7:32 PM Full Code [id number]  Lorella Nimrod, MD Inpatient   04/24/2016  7:27 PM 04/27/2016  8:08 PM Full Code [id number]  Loletha Grayer, MD ED   04/19/2016 12:50 AM 04/22/2016 12:10 AM Full Code [id number]  Lance Coon, MD Inpatient   03/27/2016  9:39 PM 03/29/2016  9:41 PM Full Code [id number]  Fritzi Mandes, MD ED   03/12/2016 12:21 PM 03/13/2016  8:32 PM Full Code [id number]  Dustin Flock, MD Inpatient   03/09/2016  4:33 PM 03/12/2016 12:21 PM DNR [id number]  Nicholes Mango, MD ED   02/26/2016  9:39 PM 03/01/2016 10:37 PM DNR [id number]  Karmen Bongo, MD Inpatient   02/26/2016  7:48 PM 02/26/2016  9:39 PM DNR [id number]  Dorie Rank, MD ED   01/31/2016  5:45 PM 02/07/2016 11:45 PM DNR [id number]  Flora Lipps, MD Inpatient   01/30/2016 11:19 PM 01/31/2016  5:44 PM Full Code [id number]  Hugelmeyer, Ubaldo Glassing, DO Inpatient   11/26/2014  3:52 PM 11/28/2014  3:07 PM Full Code [id number]  Max Sane, MD Inpatient   11/26/2014  8:59 AM 11/26/2014  3:52 PM Full Code [id number]  Max Sane, MD ED    Advance Directive Documentation     Most Recent Value  Type of Advance Directive  Healthcare Power of [place], Living will  Pre-existing out of facility DNR order (yellow form or pink MOST form)  -  "MOST" Form in Place?  -      TOTAL TIME TAKING CARE OF THIS PATIENT: 40 minutes.   Jourdin Gens [place] 07/28/2016, 6:23 PM  [facility]  Office  [phone]  CC: Primary care physician; [facility]  Note:  This document was prepared using Dragon voice recognition software and may include unintentional dictation errors.

## 2016-07-28 NOTE — ED Notes (Signed)
Patient transported to CT 

## 2016-07-28 NOTE — ED Notes (Signed)
Dr. Burlene Arnt at bedside for fecal disimpaction assisted by this RN and Edison Nasuti, ED Tech.

## 2016-07-28 NOTE — ED Triage Notes (Signed)
Pt arrived via EMS from home for reports of constipation for two weeks. Pt reports new lower abdominal pain today that radiates to back. Pt began to vomit en route to ED. EMS reports emesis is brown liquid. EMS gave Zofran 8 mg IV.

## 2016-07-28 NOTE — ED Notes (Addendum)
Pt. States she has been constipated for the past 2 weeks.  Pt. States taking stool softeners with no relief. Pt. States nausea and vomiting(brown liquid) started today around 1 pm.

## 2016-07-28 NOTE — ED Notes (Signed)
Pt. Reports low abdominal pain that radiates to back.

## 2016-07-28 NOTE — ED Notes (Signed)
Pt. vomited moderate amount on return trip from [place].

## 2016-07-28 NOTE — ED Provider Notes (Addendum)
[facility] Emergency Department Provider Note  ____________________________________________   I have reviewed the triage vital signs and the nursing notes.   HISTORY  Chief Complaint Abdominal Pain and Constipation    HPI Sherry [name] is a 83 y.o. female  with multiple medical problems presents today with constipation she states she has not had a bowel movement in 2 weeks. Today she began to have brown vomit. She is concerned about lower abdominal pain and pressure for the last 2 weeks. She denies any fever or chills or dysuria. Pain is a crampy discomfort. She denies any chest pain or shortness of breath. Obviously no diarrhea. Nothing makes it better nothing makes it worse.   Past Medical History:  Diagnosis Date  . Diabetes mellitus without complication ([place])   . Hyperlipidemia   . Hypertension   . Pulmonary embolism [facility])     Patient Active Problem List   Diagnosis Date Noted  . Atrial fibrillation with RVR ([place]) 07/22/2016  . Dehydration   . Renal mass   . Chest pain, rule out acute myocardial infarction 07/12/2016  . Reactive depression   . Esophagitis determined by endoscopy 05/31/2016  . Hyperglycemia   . Hypokalemia   . Cystitis   . HH (hiatus hernia)   . Hiatal hernia with gastroesophageal reflux disease without esophagitis   . GI bleed 05/28/2016  . Upper GI bleeding 05/28/2016  . Pressure injury of skin 05/28/2016  . Atrial flutter ([place])   . Gastroenteritis 04/24/2016  . Aphasia 04/18/2016  . Nausea & vomiting 03/27/2016  . Advance care planning   . UTI (urinary tract infection) 03/12/2016  . Chest pain 03/09/2016  . PE (pulmonary thromboembolism) ([place]) 02/26/2016  . Diabetes mellitus type 2, insulin dependent ([place]) 02/26/2016  . Anemia 02/26/2016  . Chronic kidney disease (CKD), stage III (moderate) 02/26/2016  . Essential hypertension 02/26/2016  . Goals of care, counseling/discussion   . Adult failure to thrive syndrome    . Palliative care by specialist   . Sepsis ([place]) 11/26/2014    Past Surgical History:  Procedure Laterality Date  . APPENDECTOMY    . BACK SURGERY    . ESOPHAGOGASTRODUODENOSCOPY N/A 05/29/2016   Procedure: ESOPHAGOGASTRODUODENOSCOPY (EGD);  Surgeon: Clarene Essex, MD;  Location: [place];  Service: Gastroenterology;  Laterality: N/A;  . SHOULDER SURGERY      Prior to Admission medications   Medication Sig Start Date End Date Taking? Authorizing Provider  amiodarone (PACERONE) 200 MG tablet Take 1 tablet (200 mg total) by mouth 2 (two) times daily. Change to q daily in 2 weeks 07/24/16   Gladstone Lighter, MD  amLODipine (NORVASC) 5 MG tablet Take 5 mg by mouth daily.    [provider]  apixaban (ELIQUIS) 2.5 MG TABS tablet Take 1 tablet (2.5 mg total) by mouth 2 (two) times daily. 07/23/16   Gladstone Lighter, MD  atorvastatin (LIPITOR) 20 MG tablet Take 20 mg by mouth at bedtime.     [provider]  diphenhydrAMINE (BENADRYL) 25 MG tablet Take 25 mg by mouth daily as needed for itching or allergies.    [provider]  ferrous sulfate 325 (65 FE) MG tablet Take 1 tablet (325 mg total) by mouth daily with breakfast. 06/03/16   Lorella Nimrod, MD  gabapentin (NEURONTIN) 300 MG capsule Take 300 mg by mouth at bedtime.     [provider]  insulin degludec (TRESIBA FLEXTOUCH) 100 UNIT/ML SOPN FlexTouch Pen Inject 10 Units into the skin every  morning.    [provider]  Insulin Detemir (LEVEMIR) 100 UNIT/ML Pen Inject 8 Units into the skin every morning. 06/02/16   Lorella Nimrod, MD  lisinopril (PRINIVIL,ZESTRIL) 20 MG tablet Take 1 tablet (20 mg total) by mouth daily. 07/24/16   Gladstone Lighter, MD  magnesium oxide (MAG-OX) 400 (241.3 Mg) MG tablet Take 1 tablet (400 mg total) by mouth daily. 04/27/16   Loletha Grayer, MD  meloxicam (MOBIC) 7.5 MG tablet Take 7.5 mg by mouth daily.    [provider]  metoCLOPramide (REGLAN) 5 MG  tablet Take 1 tablet (5 mg total) by mouth 2 (two) times daily. 06/02/16   Lorella Nimrod, MD  metoprolol tartrate (LOPRESSOR) 25 MG tablet Take 1 tablet (25 mg total) by mouth 2 (two) times daily. 07/24/16   Gladstone Lighter, MD  Multiple Vitamin (MULTIVITAMIN WITH MINERALS) TABS tablet Take 1 tablet by mouth daily. 06/02/16   Shela Leff, MD  ondansetron (ZOFRAN-ODT) 4 MG disintegrating tablet Take 4 mg by mouth every 6 (six) hours as needed for nausea or vomiting.    [provider]  pantoprazole (PROTONIX) 40 MG tablet Take 1 tablet (40 mg total) by mouth 2 (two) times daily. Patient taking differently: Take 40 mg by mouth daily.  04/27/16   Loletha Grayer, MD  polyethylene glycol [facility]) packet Take 17 g by mouth daily as needed. 04/27/16   Loletha Grayer, MD  sertraline (ZOLOFT) 25 MG tablet Take 1 tablet (25 mg total) by mouth daily. 06/02/16   Lorella Nimrod, MD  sucralfate (CARAFATE) 1 GM/10ML suspension Take 10 mLs (1 g total) by mouth 4 (four) times daily -  with meals and at bedtime. 06/02/16   Lorella Nimrod, MD  tamsulosin (FLOMAX) 0.4 MG CAPS capsule Take 1 capsule (0.4 mg total) by mouth daily. Patient taking differently: Take 0.4 mg by mouth daily after lunch.  03/14/16   Fritzi Mandes, MD    Allergies Patient has no known allergies.  Family History  Problem Relation Age of Onset  . Diabetes Mother   . Cancer Mother   . Cancer Father   . Bladder Cancer Neg Hx   . Kidney cancer Neg Hx     Social History Social History  Substance Use Topics  . Smoking status: Never Smoker  . Smokeless tobacco: Never Used  . Alcohol use No    Review of Systems Constitutional: No fever/chills Eyes: No visual changes. ENT: No sore throat. No stiff neck no neck pain Cardiovascular: Denies chest pain. Respiratory: Denies shortness of breath. Gastrointestinal:   See history of present illness Genitourinary: Negative for dysuria. Musculoskeletal: Negative lower extremity  swelling Skin: Negative for rash. Neurological: Negative for severe headaches, focal weakness or numbness.   ____________________________________________   PHYSICAL EXAM:  VITAL SIGNS: ED Triage Vitals  Enc Vitals Group     BP 07/28/16 1517 (!) 183/86     Pulse Rate 07/28/16 1517 82     Resp 07/28/16 1517 20     Temp --      Temp src --      SpO2 07/28/16 1517 99 %     Weight 07/28/16 1514 128 lb (58.1 kg)     Height 07/28/16 1514 5\' 4"  (1.626 m)     Head Circumference --      Peak Flow --      Pain Score 07/28/16 1513 10     Pain Loc --      Pain Edu? --  Excl. in [place]? --     Constitutional: Alert and oriented. Patient appears somewhat uncomfortable but nontoxic Eyes: Conjunctivae are normal Head: Atraumatic HEENT: No congestion/rhinnorhea. Mucous membranes are moist.  Oropharynx non-erythematous Neck:   Nontender with no meningismus, no masses, no stridor Cardiovascular: Normal rate, regular rhythm. Grossly normal heart sounds.  Good peripheral circulation. Respiratory: Normal respiratory effort.  No retractions. Lungs CTAB. Abdominal: Soft and positive tenderness suprapubic region. No distention. No guarding no rebound Back:  There is no focal tenderness or step off.  there is no midline tenderness there are no lesions noted. there is no CVA tenderness  Musculoskeletal: No lower extremity tenderness, no upper extremity tenderness. No joint effusions, no DVT signs strong distal pulses no edema Neurologic:  Normal speech and language. No gross focal neurologic deficits are appreciated.  Skin:  Skin is warm, dry and intact. No rash noted. Psychiatric: Mood and affect are normal. Speech and behavior are normal.  ____________________________________________   LABS (all labs ordered are listed, but only abnormal results are displayed)  Labs Reviewed  COMPREHENSIVE METABOLIC PANEL - Abnormal; Notable for the following:       Result Value   Potassium 3.3 (*)     Glucose, Bld 205 (*)    Creatinine, Ser 1.01 (*)    Calcium 8.0 (*)    Total Protein 5.5 (*)    Albumin 2.7 (*)    ALT 9 (*)    GFR calc non Af Amer 49 (*)    GFR calc Af Amer 57 (*)    All other components within normal limits  CBC - Abnormal; Notable for the following:    RDW 14.9 (*)    All other components within normal limits  LIPASE, BLOOD  TROPONIN I  URINALYSIS, COMPLETE (UACMP) WITH MICROSCOPIC   ____________________________________________  EKG  I personally interpreted any EKGs ordered by me or triage EKG shows old left bundle-branch block, sinus rhythm ____________________________________________  RADIOLOGY  I reviewed any imaging ordered by me or triage that were performed during my shift and, if possible, patient and/or family made aware of any abnormal findings. ____________________________________________   PROCEDURES  Procedure(s) performed: None  Procedures  Critical Care performed: None  ____________________________________________   INITIAL IMPRESSION / ASSESSMENT AND PLAN / ED COURSE  Pertinent labs & imaging results that were available during my care of the patient were reviewed by me and considered in my medical decision making (see chart for details).  Patient here with constipation and now brown and possibly feculent vomiting. CT scan shows significant stool load. We will try disimpaction and enema but given the patient's vomiting think that she likely will need to be admitted for bowel prep.  ----------------------------------------- 5:32 PM on 07/28/2016 -----------------------------------------  Admitting the patient to the hospital she is in no acute distress at this time not vomiting I am concerned about the possibility is that above the feculent vomit I did do a disimpaction with a female no chaperone present. I was able to get a very large amount of stool out of the rectum. Soft. Trace is of hematochezia were noted. No obvious trauma  to the patient however and she was not suffering from discomfort during the procedure. We'll not try an enema to see if we can continue the process of eliminating a very large fecal impaction. However given her vomiting and her very considerable stool load, she likely will need admission for further evaluation    ____________________________________________   FINAL CLINICAL IMPRESSION(S) / ED DIAGNOSES  Final diagnoses:  Abdominal pain, unspecified abdominal location  Nausea and vomiting, intractability of vomiting not specified, unspecified vomiting type  Fecal impaction [facility])      This chart was dictated using voice recognition software.  Despite best efforts to proofread,  errors can occur which can change meaning.      Schuyler Amor, MD 07/28/16 1714    Schuyler Amor, MD 07/28/16 [phone]

## 2016-07-29 ENCOUNTER — Observation Stay: Payer: Medicare Other

## 2016-07-29 DIAGNOSIS — K5641 Fecal impaction: Secondary | ICD-10-CM | POA: Diagnosis not present

## 2016-07-29 LAB — GLUCOSE, CAPILLARY
GLUCOSE-CAPILLARY: 153 mg/dL — AB (ref 65–99)
Glucose-Capillary: 170 mg/dL — ABNORMAL HIGH (ref 65–99)
Glucose-Capillary: 186 mg/dL — ABNORMAL HIGH (ref 65–99)
Glucose-Capillary: 226 mg/dL — ABNORMAL HIGH (ref 65–99)

## 2016-07-29 LAB — BASIC METABOLIC PANEL
Anion gap: 9 (ref 5–15)
BUN: 14 mg/dL (ref 6–20)
CHLORIDE: 98 mmol/L — AB (ref 101–111)
CO2: 32 mmol/L (ref 22–32)
Calcium: 8.4 mg/dL — ABNORMAL LOW (ref 8.9–10.3)
Creatinine, Ser: 0.98 mg/dL (ref 0.44–1.00)
GFR calc Af Amer: 59 mL/min — ABNORMAL LOW (ref >60)
GFR calc non Af Amer: 51 mL/min — ABNORMAL LOW (ref >60)
Glucose, Bld: 161 mg/dL — ABNORMAL HIGH (ref 65–99)
Potassium: 3.3 mmol/L — ABNORMAL LOW (ref 3.5–5.1)
SODIUM: 139 mmol/L (ref 135–145)

## 2016-07-29 LAB — CBC
HEMATOCRIT: 36.2 % (ref 35.0–47.0)
Hemoglobin: 12.3 g/dL (ref 12.0–16.0)
MCH: 27.6 pg (ref 26.0–34.0)
MCHC: 33.9 g/dL (ref 32.0–36.0)
MCV: 81.6 fL (ref 80.0–100.0)
Platelets: 289 10*3/uL (ref 150–440)
RBC: 4.43 MIL/uL (ref 3.80–5.20)
RDW: 14.4 % (ref 11.5–14.5)
WBC: 8.2 10*3/uL (ref 3.6–11.0)

## 2016-07-29 LAB — MAGNESIUM: MAGNESIUM: 1.9 mg/dL (ref 1.7–2.4)

## 2016-07-29 MED ORDER — KCL IN DEXTROSE-NACL 20-5-0.9 MEQ/L-%-% IV SOLN
INTRAVENOUS | Status: DC
  Administered 2016-07-29 – 2016-08-02 (×8): via INTRAVENOUS
  Filled 2016-07-29 (×10): qty 1000

## 2016-07-29 MED ORDER — POTASSIUM CHLORIDE CRYS ER 20 MEQ PO TBCR
40.0000 meq | EXTENDED_RELEASE_TABLET | Freq: Once | ORAL | Status: AC
  Administered 2016-07-29: 40 meq via ORAL
  Filled 2016-07-29: qty 2

## 2016-07-29 MED ORDER — METOCLOPRAMIDE HCL 5 MG/ML IJ SOLN
5.0000 mg | Freq: Three times a day (TID) | INTRAMUSCULAR | Status: DC
  Administered 2016-07-29 – 2016-08-02 (×12): 5 mg via INTRAVENOUS
  Filled 2016-07-29 (×12): qty 2

## 2016-07-29 MED ORDER — INSULIN ASPART 100 UNIT/ML ~~LOC~~ SOLN
0.0000 [IU] | Freq: Four times a day (QID) | SUBCUTANEOUS | Status: DC
  Administered 2016-07-29 (×2): 2 [IU] via SUBCUTANEOUS
  Administered 2016-07-30: 3 [IU] via SUBCUTANEOUS
  Administered 2016-07-30: 1 [IU] via SUBCUTANEOUS
  Administered 2016-07-30 (×2): 3 [IU] via SUBCUTANEOUS
  Administered 2016-07-31: 2 [IU] via SUBCUTANEOUS
  Administered 2016-07-31: 3 [IU] via SUBCUTANEOUS
  Administered 2016-07-31 – 2016-08-01 (×3): 2 [IU] via SUBCUTANEOUS
  Administered 2016-08-01: 3 [IU] via SUBCUTANEOUS
  Administered 2016-08-01 – 2016-08-02 (×3): 2 [IU] via SUBCUTANEOUS
  Filled 2016-07-29 (×15): qty 1

## 2016-07-29 MED ORDER — ORAL CARE MOUTH RINSE
15.0000 mL | Freq: Two times a day (BID) | OROMUCOSAL | Status: DC
  Administered 2016-07-29 – 2016-08-02 (×7): 15 mL via OROMUCOSAL

## 2016-07-29 MED ORDER — DOCUSATE SODIUM 100 MG PO CAPS
100.0000 mg | ORAL_CAPSULE | Freq: Two times a day (BID) | ORAL | Status: DC
  Administered 2016-07-29 – 2016-08-01 (×6): 100 mg via ORAL
  Filled 2016-07-29 (×9): qty 1

## 2016-07-29 MED ORDER — POLYETHYLENE GLYCOL 3350 17 G PO PACK
17.0000 g | PACK | Freq: Every day | ORAL | Status: DC
  Administered 2016-07-29 – 2016-08-01 (×4): 17 g via ORAL
  Filled 2016-07-29 (×5): qty 1

## 2016-07-29 MED ORDER — SENNA 8.6 MG PO TABS
1.0000 | ORAL_TABLET | Freq: Every day | ORAL | Status: DC
  Administered 2016-07-29 – 2016-08-02 (×5): 8.6 mg via ORAL
  Filled 2016-07-29 (×6): qty 1

## 2016-07-29 NOTE — Evaluation (Signed)
Physical Therapy Evaluation Patient Details Name: Sherry [name] MRN: [id number] DOB: [birth year] Today's Date: 07/29/2016   History of Present Illness  Pt is an 83 y.o.femalewho presents with Abdominal discomfort and reported constipation of 2 weeks' duration. Imaging here in the ED shows some fecal impaction and significant stool burden throughout her entire colon. Some disimpaction was able to be performed in the ED. Patient is actually vomiting feculent-appearing material.  Assessment includes: Fecal impaction with resultant nausea and vomiting, KUB showed no significant abnormalities, hypokalemia, acute renal insufficiency, and DM.    Clinical Impression  Pt presents with deficits in strength, transfers, mobility, gait, balance, and activity tolerance.  Pt found in supine on 2LO2/min with SpO2 98% and HR 63 bpm.  Nsg notified and approved trial with PT on room air.  Activity level slowly progressed from sup to sitting and eventually to standing with vitals measured frequently with SpO2 remaining >/= 95% throughout with HR in the 60's.  Pt was SBA with bed mobility tasks with extra time and effort required.  Pt was CGA with transfers with increased effort to complete task.  Pt able to do some pre-gait marching activities and to amb around 5' before returning to sitting at EOB fatigued.  Per above HR and SpO2 remained WNL.  Upon returning to supine pt c/o feeling like she was going "to pass out".  Nursing called and vitals taken with SpO2 98%, HR low 60's, and BP 115/38 mmHg.  Nursing remained in room with pt monitoring pt's status with this PT ending the session.  Pt will benefit from PT services in a SNF setting to safely address above deficits for decreased caregiver assistance and eventual return to PLOF and to prior living situation.      Follow Up Recommendations SNF    Equipment Recommendations  None recommended by PT    Recommendations for Other Services       Precautions /  Restrictions Precautions Precautions: Fall Restrictions Weight Bearing Restrictions: No      Mobility  Bed Mobility Overal bed mobility: Needs Assistance Bed Mobility: Supine to Sit;Sit to Supine     Supine to sit: Supervision Sit to supine: Supervision   General bed mobility comments: Extra time and effort required for bed mobility tasks  Transfers Overall transfer level: Needs assistance Equipment used: Rolling walker (2 wheeled) Transfers: Sit to/from Stand Sit to Stand: Min guard         General transfer comment: No LOB upon initial stand but fatigued with the effort to stand  Ambulation/Gait Ambulation/Gait assistance: Min guard Ambulation Distance (Feet): 5 Feet Assistive device: Rolling walker (2 wheeled) Gait Pattern/deviations: Step-through pattern;Decreased step length - right;Decreased step length - left     General Gait Details: Slow cadence with several steps near EOB with pt fatigued and returned to sitting.   Nsg approved PT on room air secondary to baseline SpO2 98%, HR 63 bpm.  SpO2 96-98% throughout session on room air with HR in the 60s, no DOE.  Stairs            Wheelchair Mobility    Modified Rankin (Stroke Patients Only)       Balance Overall balance assessment: Needs assistance Sitting-balance support: Feet supported Sitting balance-Leahy Scale: Good     Standing balance support: Bilateral upper extremity supported Standing balance-Leahy Scale: Fair  Pertinent Vitals/Pain Pain Assessment: 0-10 Pain Score: 5  Pain Location: Abdominal pain Pain Descriptors / Indicators: Cramping;Discomfort Pain Intervention(s): Monitored during session;Limited activity within patient's tolerance;Other (comment) (offered to contact RN regarding pain meds, pt refused)    Home Living Family/patient expects to be discharged to:: Private residence Living Arrangements: Children Available Help at  Discharge: Family;Available 24 hours/day Type of Home: Apartment Home Access: Level entry     Home Layout: One level Home Equipment: Walker - 2 wheels;Hospital bed;Shower seat;Wheelchair - manual;Bedside commode;Cane - single point      Prior Function Level of Independence: Independent with assistive device(s)         Comments: Pt reports Mod I with amb with use of SPC, 4 falls in last 6 months, unsure of cause     Hand Dominance   Dominant Hand: Right    Extremity/Trunk Assessment   Upper Extremity Assessment Upper Extremity Assessment: Generalized weakness    Lower Extremity Assessment Lower Extremity Assessment: Generalized weakness    Cervical / Trunk Assessment Cervical / Trunk Assessment: Kyphotic  Communication   Communication: HOH  Cognition Arousal/Alertness: Lethargic Behavior During Therapy: WFL for tasks assessed/performed Overall Cognitive Status: Within Functional Limits for tasks assessed                                        General Comments      Exercises Total Joint Exercises Ankle Circles/Pumps: AROM;Both;10 reps;15 reps Quad Sets: Strengthening;Both;10 reps;15 reps Gluteal Sets: Strengthening;Both;10 reps;15 reps Heel Slides: AROM;Both;10 reps Hip ABduction/ADduction: AROM;Both;10 reps Straight Leg Raises: AROM;Both;10 reps Long Arc Quad: AROM;Both;10 reps Marching in Standing: AROM;Both;10 reps   Assessment/Plan    PT Assessment Patient needs continued PT services  PT Problem List Decreased strength;Decreased activity tolerance;Decreased balance;Decreased mobility       PT Treatment Interventions Gait training;DME instruction;Functional mobility training;Balance training;Neuromuscular re-education;Therapeutic exercise;Therapeutic activities;Patient/family education    PT Goals (Current goals can be found in the Care Plan section)  Acute Rehab PT Goals Patient Stated Goal: To get my strength back PT Goal  Formulation: With patient Time For Goal Achievement: 08/11/16 Potential to Achieve Goals: Good    Frequency Min 2X/week   Barriers to discharge        Co-evaluation               AM-PAC PT "6 Clicks" Daily Activity  Outcome Measure Difficulty turning over in bed (including adjusting bedclothes, sheets and blankets)?: A Little Difficulty moving from lying on back to sitting on the side of the bed? : A Little Difficulty sitting down on and standing up from a chair with arms (e.g., wheelchair, bedside commode, etc,.)?: Total Help needed moving to and from a bed to chair (including a wheelchair)?: A Little Help needed walking in hospital room?: A Lot Help needed climbing 3-5 steps with a railing? : Total 6 Click Score: 13    End of Session Equipment Utilized During Treatment: Gait belt Activity Tolerance: Patient limited by fatigue Patient left: in bed;with bed alarm set;with call bell/phone within reach;with nursing/sitter in room Nurse Communication: Mobility status;Other (comment) (Pt c/o feeling like she "could pass out" at end of session) PT Visit Diagnosis: Muscle weakness (generalized) (M62.81);Difficulty in walking, not elsewhere classified (R26.2)    Time: 1525-1600 PT Time Calculation (min) (ACUTE ONLY): 35 min   Charges:   PT Evaluation $PT Eval Low Complexity: 1 Procedure PT Treatments $Therapeutic  Exercise: 8-22 mins   PT G Codes:   PT G-Codes **NOT FOR INPATIENT CLASS** Functional Assessment Tool Used: AM-PAC 6 Clicks Basic Mobility Functional Limitation: Mobility: Walking and moving around Mobility: Walking and Moving Around Current Status (J6967): At least 40 percent but less than 60 percent impaired, limited or restricted Mobility: Walking and Moving Around Goal Status [phone]): At least 1 percent but less than 20 percent impaired, limited or restricted    D. Royetta Asal PT, DPT 07/29/16, 4:32 PM

## 2016-07-29 NOTE — Care Management Obs Status (Signed)
[place] NOTIFICATION   Patient Details  Name: Sherry [name] MRN: [id number] Date of Birth: Mar 24, 1927   Medicare Observation Status Notification Given:  Yes [facility] letter)    Mardene Speak, RN 07/29/2016, 4:42 PM

## 2016-07-29 NOTE — Progress Notes (Signed)
[facility] [place] NAME: Sherry [name]    MR#:  [id number]  DATE OF BIRTH:  [birth year]  SUBJECTIVE:  CHIEF COMPLAINT:   Chief Complaint  Patient presents with  . Abdominal Pain  . Constipation   The patient is a 83 year old Caucasian female with past medical history significant with history of diabetes, hypertension, hyperlipidemia, pulmonary embolism, who presents to the hospital with abdominal discomfort, constipation. 2 weeks. CT of the abdomen and pelvis revealed fecal impaction, significant stool burden, disimpaction was performed. In emergency room, but patient continued to have nausea and intermittent vomiting, she vomited feculent-appearing material earlier On arrival to the floor. She denies any abdominal pain at present, Continues to have nausea  Review of Systems  Constitutional: Negative for chills, fever and weight loss.  HENT: Negative for congestion.   Eyes: Negative for blurred vision and double vision.  Respiratory: Negative for cough, sputum production, shortness of breath and wheezing.   Cardiovascular: Negative for chest pain, palpitations, orthopnea, leg swelling and PND.  Gastrointestinal: Positive for nausea. Negative for abdominal pain, blood in stool, constipation, diarrhea and vomiting.  Genitourinary: Negative for dysuria, frequency, hematuria and urgency.  Musculoskeletal: Negative for falls.  Neurological: Negative for dizziness, tremors, focal weakness and headaches.  Endo/Heme/Allergies: Does not bruise/bleed easily.  Psychiatric/Behavioral: Negative for depression. The patient does not have insomnia.     VITAL SIGNS: Blood pressure (!) 107/27, pulse (!) 58, temperature 98.5 F (36.9 C), temperature source Oral, resp. rate 16, height 5\' 4"  (1.626 m), weight 56.2 kg (123 lb 14.4 oz), SpO2 100 %.  PHYSICAL EXAMINATION:   GENERAL:  83 y.o.-year-old patient lying in the bed with no acute distress.    EYES: Pupils equal, round, reactive to light and accommodation. No scleral icterus. Extraocular muscles intact.  HEENT: Head atraumatic, normocephalic. Oropharynx and nasopharynx clear.  NECK:  Supple, no jugular venous distention. No thyroid enlargement, no tenderness.  LUNGS: Normal breath sounds bilaterally, no wheezing, rales,rhonchi or crepitation. No use of accessory muscles of respiration.  CARDIOVASCULAR: S1, S2 normal. No murmurs, rubs, or gallops.  ABDOMEN: Soft, nontender, nondistended. Bowel sounds present. No organomegaly or mass.  EXTREMITIES: No pedal edema, cyanosis, or clubbing.  NEUROLOGIC: Cranial nerves II through XII are intact. Muscle strength 5/5 in all extremities. Sensation intact. Gait not checked.  PSYCHIATRIC: The patient is alert and oriented x 3.  SKIN: No obvious rash, lesion, or ulcer.   ORDERS/RESULTS REVIEWED:   CBC  Recent Labs Lab 07/28/16 1515 07/29/16 0405  WBC 4.4 8.2  HGB 12.2 12.3  HCT 36.9 36.2  PLT 306 289  MCV 82.2 81.6  MCH 27.0 27.6  MCHC 32.9 33.9  RDW 14.9* 14.4   ------------------------------------------------------------------------------------------------------------------  Chemistries   Recent Labs Lab 07/23/16 0552 07/24/16 0534 07/28/16 1515 07/29/16 0405  NA 135 134* 135 139  K 4.2 4.0 3.3* 3.3*  CL 98* 99* 103 98*  CO2 30 30 26  32  GLUCOSE 126* 54* 205* 161*  BUN 11 20 16 14   CREATININE 0.96 1.07* 1.01* 0.98  CALCIUM 8.1* 8.0* 8.0* 8.4*  MG  --   --   --  1.9  AST  --   --  21  --   ALT  --   --  9*  --   ALKPHOS  --   --  79  --   BILITOT  --   --  0.6  --    ------------------------------------------------------------------------------------------------------------------ estimated creatinine  clearance is 35.6 mL/min (by C-G formula based on SCr of 0.98 mg/dL). ------------------------------------------------------------------------------------------------------------------ No results for input(s): TSH,  T4TOTAL, T3FREE, THYROIDAB in the last 72 hours.  Invalid input(s): FREET3  Cardiac Enzymes  Recent Labs Lab 07/23/16 0552 07/24/16 0534 07/28/16 1515  TROPONINI 0.22* 0.10* <0.03   ------------------------------------------------------------------------------------------------------------------ Invalid input(s): POCBNP ---------------------------------------------------------------------------------------------------------------  RADIOLOGY: Ct Abdomen Pelvis Wo Contrast  Result Date: 07/28/2016 CLINICAL DATA:  83 year old with low abdominal pain radiating to the back, constipation and vomiting for 2 weeks. EXAM: CT ABDOMEN AND PELVIS WITHOUT CONTRAST TECHNIQUE: Multidetector CT imaging of the abdomen and pelvis was performed following the standard protocol without IV contrast. COMPARISON:  CT 07/13/2016 and 06/22/2016.  Radiographs 07/18/2016. FINDINGS: Lower chest: Moderate size hiatal hernia has slightly enlarged compared with the prior study and is fluid-filled. There are new small right-greater-than-left pleural effusions with mild dependent atelectasis at both lung bases. Aortic and coronary artery atherosclerosis are present. Hepatobiliary: The liver is normal in density without focal abnormality. No evidence of gallstones, gallbladder wall thickening or biliary dilatation. Pancreas: Atrophied without focal abnormality or surrounding inflammation. Spleen: Normal in size without focal abnormality. Adrenals/Urinary Tract: Both adrenal glands appear normal. Persistent abnormal appearance of the right kidney with distention of the upper pole calices and pelvis, previously evaluated 06/22/2016. There are faint calcifications within the renal pelvis. The left kidney demonstrates no suspicious findings. The bladder appears unremarkable. Stomach/Bowel: No evidence of bowel wall thickening, distention or surrounding inflammatory change. There is large amount of stool throughout the colon, especially  distally in the rectum. Vascular/Lymphatic: There are no enlarged abdominal or pelvic lymph nodes. Diffuse aortic and branch vessel atherosclerosis. Reproductive: The uterus and adnexa appear unremarkable. Other: No evidence of abdominal wall mass or hernia. No ascites. Musculoskeletal: No acute or significant osseous findings. Chronic L3 compression fracture and chronic spondylosis at L4-5 are stable. IMPRESSION: 1. Large amount of stool throughout the colon consistent with constipation. No evidence of bowel obstruction or perforation. 2. New small bilateral pleural effusions with mild bibasilar atelectasis. 3. Moderate size fluid-filled hiatal hernia. 4. Persistent abnormal appearance of the right renal pelvis and upper pole calices worrisome for transitional carcinoma as described on previous studies. 5.  Aortic Atherosclerosis (ICD10-I70.0). Electronically Signed   By: Richardean Sale M.D.   On: 07/28/2016 16:33   Dg Abd 1 View  Result Date: 07/29/2016 CLINICAL DATA:  Ileus, abdominal soreness, diabetes mellitus, hypertension EXAM: ABDOMEN - 1 VIEW COMPARISON:  CT abdomen and pelvis 07/28/2016 FINDINGS: Scattered gas and stool throughout colon. No bowel dilatation or bowel wall thickening. Atelectasis at LEFT lung base. Osseous demineralization with degenerative changes lumbar spine and chronic L3 compression deformity. No definite urinary tract calcification. Scattered tiny pelvic phleboliths. IMPRESSION: Nonobstructive bowel gas pattern. Electronically Signed   By: Lavonia Dana M.D.   On: 07/29/2016 10:42    EKG:  Orders placed or performed in visit on 07/28/16  . EKG 12-Lead    ASSESSMENT AND PLAN:  Principal Problem:   Fecal impaction of colon ([place]) Active Problems:   Diabetes mellitus type 2, insulin dependent ([place])   Chronic kidney disease (CKD), stage III (moderate)   Essential hypertension   HLD (hyperlipidemia)   Fecal impaction ([place])  #1. Fecal impaction with resultant nausea and  vomiting, KUB showed no significant abnormalities, initiate patient on Reglan, Senokot, Colace, MiraLAX, soap suds enemas #2. Hypokalemia, supplement intravenously, recheck in the morning, magnesium level was normal at 1.9 earlier today #3 acute renal insufficiency, resolved, get urinalysis #4.  Diabetes mellitus type 2 continue sliding scale insulin,, hypoglycemic earlier today, watch closely with poor oral intake  Management plans discussed with the patient, family and they are in agreement.   DRUG ALLERGIES: No Known Allergies  CODE STATUS:     Code Status Orders        Start     Ordered   07/28/16 2106  Full code  Continuous     07/28/16 2106    Code Status History    Date Active Date Inactive Code Status Order ID Comments User Context   07/22/2016  4:13 AM 07/24/2016  5:43 PM Full Code [id number]  Harrie Foreman, MD Inpatient   07/12/2016 10:45 PM 07/18/2016  8:32 PM Full Code [id number]  Hugelmeyer, Alexis, DO Inpatient   05/28/2016 12:01 PM 06/02/2016  7:32 PM Full Code [id number]  Lorella Nimrod, MD Inpatient   04/24/2016  7:27 PM 04/27/2016  8:08 PM Full Code [id number]  Loletha Grayer, MD ED   04/19/2016 12:50 AM 04/22/2016 12:10 AM Full Code [id number]  Lance Coon, MD Inpatient   03/27/2016  9:39 PM 03/29/2016  9:41 PM Full Code [id number]  Fritzi Mandes, MD ED   03/12/2016 12:21 PM 03/13/2016  8:32 PM Full Code [id number]  Dustin Flock, MD Inpatient   03/09/2016  4:33 PM 03/12/2016 12:21 PM DNR [id number]  Nicholes Mango, MD ED   02/26/2016  9:39 PM 03/01/2016 10:37 PM DNR [id number]  Karmen Bongo, MD Inpatient   02/26/2016  7:48 PM 02/26/2016  9:39 PM DNR [id number]  Dorie Rank, MD ED   01/31/2016  5:45 PM 02/07/2016 11:45 PM DNR [id number]  Flora Lipps, MD Inpatient   01/30/2016 11:19 PM 01/31/2016  5:44 PM Full Code [id number]  Hugelmeyer, Ubaldo Glassing, DO Inpatient   11/26/2014  3:52 PM 11/28/2014  3:07 PM Full Code [id number]  Max Sane, MD Inpatient   11/26/2014  8:59 AM 11/26/2014  3:52 PM Full  Code [id number]  Max Sane, MD ED    Advance Directive Documentation     Most Recent Value  Type of Advance Directive  Healthcare Power of Attorney, Living will  Pre-existing out of facility DNR order (yellow form or pink MOST form)  -  "MOST" Form in Place?  -      TOTAL TIME TAKING CARE OF THIS PATIENT: 35 minutes.    Theodoro Grist M.D on 07/29/2016 at 2:25 PM  Between 7am to 6pm - Pager - [phone]  After 6pm go to [email or web address] - password [email or web address] [facility]  [place] [facility]  Office  [phone]  CC: Primary care physician; Center, [facility]

## 2016-07-30 DIAGNOSIS — K5904 Chronic idiopathic constipation: Secondary | ICD-10-CM

## 2016-07-30 DIAGNOSIS — K5641 Fecal impaction: Secondary | ICD-10-CM | POA: Diagnosis not present

## 2016-07-30 LAB — GLUCOSE, CAPILLARY
Glucose-Capillary: 238 mg/dL — ABNORMAL HIGH (ref 65–99)
Glucose-Capillary: 142 mg/dL — ABNORMAL HIGH (ref 65–99)
Glucose-Capillary: 222 mg/dL — ABNORMAL HIGH (ref 65–99)
Glucose-Capillary: 215 mg/dL — ABNORMAL HIGH (ref 65–99)

## 2016-07-30 LAB — POTASSIUM: Potassium: 3.8 mmol/L (ref 3.5–5.1)

## 2016-07-30 LAB — MAGNESIUM: MAGNESIUM: 1.7 mg/dL (ref 1.7–2.4)

## 2016-07-30 MED ORDER — MAGNESIUM OXIDE 400 (241.3 MG) MG PO TABS
400.0000 mg | ORAL_TABLET | Freq: Every day | ORAL | Status: AC
  Administered 2016-07-30 – 2016-07-31 (×2): 400 mg via ORAL
  Filled 2016-07-30 (×3): qty 1

## 2016-07-30 NOTE — Progress Notes (Signed)
[facility] at [place] NAME: Sherry [name]    MR#:  [id number]  DATE OF BIRTH:  08-18-1927  SUBJECTIVE:  CHIEF COMPLAINT:   Chief Complaint  Patient presents with  . Abdominal Pain  . Constipation   The patient is a 83 year old Caucasian female with past medical history significant with history of diabetes, hypertension, hyperlipidemia, pulmonary embolism, who presents to the hospital with abdominal discomfort, constipation. 2 weeks. CT of the abdomen and pelvis revealed fecal impaction, significant stool burden, disimpaction was performed. In emergency room, but patient continued to have nausea and intermittent vomiting, she vomited feculent-appearing material On arrival to the floor. She denies Significant abdominal pain at present, but Continues to have nausea, however, she wants me to advance her diet. She ate about 100% of full liquid diet, however, did not eat nothing for lunch. Last bowel movement was mushy at around 3 PM yesterday  Review of Systems  Constitutional: Negative for chills, fever and weight loss.  HENT: Negative for congestion.   Eyes: Negative for blurred vision and double vision.  Respiratory: Negative for cough, sputum production, shortness of breath and wheezing.   Cardiovascular: Negative for chest pain, palpitations, orthopnea, leg swelling and PND.  Gastrointestinal: Positive for nausea. Negative for abdominal pain, blood in stool, constipation, diarrhea and vomiting.  Genitourinary: Negative for dysuria, frequency, hematuria and urgency.  Musculoskeletal: Negative for falls.  Neurological: Negative for dizziness, tremors, focal weakness and headaches.  Endo/Heme/Allergies: Does not bruise/bleed easily.  Psychiatric/Behavioral: Negative for depression. The patient does not have insomnia.     VITAL SIGNS: Blood pressure (!) 157/49, pulse 78, temperature (!) 97.5 F (36.4 C), temperature source Oral, resp. rate  18, height 5\' 4"  (1.626 m), weight 56.2 kg (123 lb 14.4 oz), SpO2 100 %.  PHYSICAL EXAMINATION:   GENERAL:  83 y.o.-year-old patient lying in the bed in no acute distress, however, uncomfortable and somewhat sad appearing.  EYES: Pupils equal, round, reactive to light and accommodation. No scleral icterus. Extraocular muscles intact.  HEENT: Head atraumatic, normocephalic. Oropharynx and nasopharynx clear.  NECK:  Supple, no jugular venous distention. No thyroid enlargement, no tenderness.  LUNGS: Normal breath sounds bilaterally, no wheezing, rales,rhonchi or crepitation. No use of accessory muscles of respiration.  CARDIOVASCULAR: S1, S2 normal. No murmurs, rubs, or gallops.  ABDOMEN: Soft, nontender, nondistended. Bowel sounds present. No organomegaly or mass.  EXTREMITIES: No pedal edema, cyanosis, or clubbing.  NEUROLOGIC: Cranial nerves II through XII are intact. Muscle strength 5/5 in all extremities. Sensation intact. Gait not checked.  PSYCHIATRIC: The patient is alert and oriented x 3.  SKIN: No obvious rash, lesion, or ulcer.   ORDERS/RESULTS REVIEWED:   CBC  Recent Labs Lab 07/28/16 1515 07/29/16 0405  WBC 4.4 8.2  HGB 12.2 12.3  HCT 36.9 36.2  PLT 306 289  MCV 82.2 81.6  MCH 27.0 27.6  MCHC 32.9 33.9  RDW 14.9* 14.4   ------------------------------------------------------------------------------------------------------------------  Chemistries   Recent Labs Lab 07/24/16 0534 07/28/16 1515 07/29/16 0405 07/30/16 0326  NA 134* 135 139  --   K 4.0 3.3* 3.3* 3.8  CL 99* 103 98*  --   CO2 30 26 32  --   GLUCOSE 54* 205* 161*  --   BUN 20 16 14   --   CREATININE 1.07* 1.01* 0.98  --   CALCIUM 8.0* 8.0* 8.4*  --   MG  --   --  1.9 1.7  AST  --  21  --   --   ALT  --  9*  --   --   ALKPHOS  --  79  --   --   BILITOT  --  0.6  --   --     ------------------------------------------------------------------------------------------------------------------ estimated creatinine clearance is 35.6 mL/min (by C-G formula based on SCr of 0.98 mg/dL). ------------------------------------------------------------------------------------------------------------------ No results for input(s): TSH, T4TOTAL, T3FREE, THYROIDAB in the last 72 hours.  Invalid input(s): FREET3  Cardiac Enzymes  Recent Labs Lab 07/24/16 0534 07/28/16 1515  TROPONINI 0.10* <0.03   ------------------------------------------------------------------------------------------------------------------ Invalid input(s): POCBNP ---------------------------------------------------------------------------------------------------------------  RADIOLOGY: Ct Abdomen Pelvis Wo Contrast  Result Date: 07/28/2016 CLINICAL DATA:  83 year old with low abdominal pain radiating to the back, constipation and vomiting for 2 weeks. EXAM: CT ABDOMEN AND PELVIS WITHOUT CONTRAST TECHNIQUE: Multidetector CT imaging of the abdomen and pelvis was performed following the standard protocol without IV contrast. COMPARISON:  CT 07/13/2016 and 06/22/2016.  Radiographs 07/18/2016. FINDINGS: Lower chest: Moderate size hiatal hernia has slightly enlarged compared with the prior study and is fluid-filled. There are new small right-greater-than-left pleural effusions with mild dependent atelectasis at both lung bases. Aortic and coronary artery atherosclerosis are present. Hepatobiliary: The liver is normal in density without focal abnormality. No evidence of gallstones, gallbladder wall thickening or biliary dilatation. Pancreas: Atrophied without focal abnormality or surrounding inflammation. Spleen: Normal in size without focal abnormality. Adrenals/Urinary Tract: Both adrenal glands appear normal. Persistent abnormal appearance of the right kidney with distention of the upper pole calices and pelvis,  previously evaluated 06/22/2016. There are faint calcifications within the renal pelvis. The left kidney demonstrates no suspicious findings. The bladder appears unremarkable. Stomach/Bowel: No evidence of bowel wall thickening, distention or surrounding inflammatory change. There is large amount of stool throughout the colon, especially distally in the rectum. Vascular/Lymphatic: There are no enlarged abdominal or pelvic lymph nodes. Diffuse aortic and branch vessel atherosclerosis. Reproductive: The uterus and adnexa appear unremarkable. Other: No evidence of abdominal wall mass or hernia. No ascites. Musculoskeletal: No acute or significant osseous findings. Chronic L3 compression fracture and chronic spondylosis at L4-5 are stable. IMPRESSION: 1. Large amount of stool throughout the colon consistent with constipation. No evidence of bowel obstruction or perforation. 2. New small bilateral pleural effusions with mild bibasilar atelectasis. 3. Moderate size fluid-filled hiatal hernia. 4. Persistent abnormal appearance of the right renal pelvis and upper pole calices worrisome for transitional carcinoma as described on previous studies. 5.  Aortic Atherosclerosis (ICD10-I70.0). Electronically Signed   By: Richardean Sale M.D.   On: 07/28/2016 16:33   Dg Abd 1 View  Result Date: 07/29/2016 CLINICAL DATA:  Ileus, abdominal soreness, diabetes mellitus, hypertension EXAM: ABDOMEN - 1 VIEW COMPARISON:  CT abdomen and pelvis 07/28/2016 FINDINGS: Scattered gas and stool throughout colon. No bowel dilatation or bowel wall thickening. Atelectasis at LEFT lung base. Osseous demineralization with degenerative changes lumbar spine and chronic L3 compression deformity. No definite urinary tract calcification. Scattered tiny pelvic phleboliths. IMPRESSION: Nonobstructive bowel gas pattern. Electronically Signed   By: Lavonia Dana M.D.   On: 07/29/2016 10:42    EKG:  Orders placed or performed in visit on 07/28/16  . EKG  12-Lead    ASSESSMENT AND PLAN:  Principal Problem:   Fecal impaction of colon ([place]) Active Problems:   Diabetes mellitus type 2, insulin dependent ([place])   Chronic kidney disease (CKD), stage III (moderate)   Essential hypertension   HLD (hyperlipidemia)   Fecal impaction ([place])  #1. Fecal  impaction, partial pseudoobstruction with resultant nausea and vomiting, KUB showed no significant abnormalities, continue patient on Reglan, Senokot, Colace, MiraLAX, soap suds enemas, discussed with Dr. Rosana Hoes of surgery, gastroenterology consultation is requested #2. Hypokalemia, supplemented intravenously, resolved, magnesium level was normal at 1.7, initiating supplementation orally , follow labs intermittently #3 acute renal insufficiency, resolved, get urinalysis, cultures if abnormal appearance #4. Diabetes mellitus type 2 continue sliding scale insulin,, no hypoglycemia today, watch closely with poor oral intake  Management plans discussed with the patient, family and they are in agreement.   DRUG ALLERGIES: No Known Allergies  CODE STATUS:     Code Status Orders        Start     Ordered   07/28/16 2106  Full code  Continuous     07/28/16 2106    Code Status History    Date Active Date Inactive Code Status Order ID Comments User Context   07/22/2016  4:13 AM 07/24/2016  5:43 PM Full Code [id number]  Harrie Foreman, MD Inpatient   07/12/2016 10:45 PM 07/18/2016  8:32 PM Full Code [id number]  Hugelmeyer, Alexis, DO Inpatient   05/28/2016 12:01 PM 06/02/2016  7:32 PM Full Code [id number]  Lorella Nimrod, MD Inpatient   04/24/2016  7:27 PM 04/27/2016  8:08 PM Full Code [id number]  Loletha Grayer, MD ED   04/19/2016 12:50 AM 04/22/2016 12:10 AM Full Code [id number]  Lance Coon, MD Inpatient   03/27/2016  9:39 PM 03/29/2016  9:41 PM Full Code [id number]  Fritzi Mandes, MD ED   03/12/2016 12:21 PM 03/13/2016  8:32 PM Full Code [id number]  Dustin Flock, MD Inpatient   03/09/2016  4:33 PM 03/12/2016 12:21 PM  DNR [id number]  Nicholes Mango, MD ED   02/26/2016  9:39 PM 03/01/2016 10:37 PM DNR [id number]  Karmen Bongo, MD Inpatient   02/26/2016  7:48 PM 02/26/2016  9:39 PM DNR [id number]  Dorie Rank, MD ED   01/31/2016  5:45 PM 02/07/2016 11:45 PM DNR [id number]  Flora Lipps, MD Inpatient   01/30/2016 11:19 PM 01/31/2016  5:44 PM Full Code [id number]  Hugelmeyer, Ubaldo Glassing, DO Inpatient   11/26/2014  3:52 PM 11/28/2014  3:07 PM Full Code [id number]  Max Sane, MD Inpatient   11/26/2014  8:59 AM 11/26/2014  3:52 PM Full Code [id number]  Max Sane, MD ED    Advance Directive Documentation     Most Recent Value  Type of Advance Directive  Healthcare Power of Attorney, Living will  Pre-existing out of facility DNR order (yellow form or pink MOST form)  -  "MOST" Form in Place?  -      TOTAL TIME TAKING CARE OF THIS PATIENT: 35 minutes. Discussed with Dr. Rosana Hoes of general surgery   Jahbari Repinski M.D on 07/30/2016 at 2:16 PM  Between 7am to 6pm - Pager - [phone]  After 6pm go to [email or web address] - password [email or web address] [facility]  [place] [facility]  Office  [phone]  CC: Primary care physician; Center, [facility]

## 2016-07-30 NOTE — Progress Notes (Signed)
Patient became nauseated mid morning and was unable to take her oral mediations. Zofran was administered per prn order. There was no vomiting. She was able to take the medications later and still complained of nausea, but had no vomiting. A soap sups enema was administered per order and patient tolerated well. She passed a lot of gas after the enema.

## 2016-07-30 NOTE — Consult Note (Signed)
SURGICAL CONSULTATION NOTE (initial) - cpt: 99254  HISTORY OF PRESENT ILLNESS (HPI):  83 y.o. female presented 2 days ago to [facility] ED with abdominal discomfort, N/V, and constipation >2 weeks. Patient underwent mechanical/digital rectal disimpaction in the ED, and CT demonstrated at that time a large amount of stool throughout the colon. Patient was admitted for bowel regimen and medical management. It seems this morning patient vomited once, after which her bowel regimen was held due to nausea. Enemas have also not yet been given today. After awaking patient from resting comfortably, patient says that she feels better with less nausea than she felt this morning and agrees to try drinking Miralax and magnesium oxide already ordered. She currently denies any abdominal pain, fever/chills, CP, or SOB, and has not vomited since this morning. Of note, patient is documented to have a history of baseline dementia, but she appears able to describe how she feels at the time of assessment.  Surgery is consulted by medical physician Dr. Ether Griffins in this context for evaluation and management of constipation.  PAST MEDICAL HISTORY (PMH):  Past Medical History:  Diagnosis Date  . Diabetes mellitus without complication ([place])   . Hyperlipidemia   . Hypertension   . Pulmonary embolism ([place])      PAST SURGICAL HISTORY ([place]):  Past Surgical History:  Procedure Laterality Date  . APPENDECTOMY    . BACK SURGERY    . ESOPHAGOGASTRODUODENOSCOPY N/A 05/29/2016   Procedure: ESOPHAGOGASTRODUODENOSCOPY (EGD);  Surgeon: Clarene Essex, MD;  Location: [facility];  Service: Gastroenterology;  Laterality: N/A;  . SHOULDER SURGERY       MEDICATIONS:  Prior to Admission medications   Medication Sig Start Date End Date Taking? Authorizing Provider  amiodarone (PACERONE) 200 MG tablet Take 1 tablet (200 mg total) by mouth 2 (two) times daily. Change to q daily in 2 weeks 07/24/16  Yes Gladstone Lighter, MD  amLODipine  (NORVASC) 5 MG tablet Take 5 mg by mouth daily.   Yes [provider]  apixaban (ELIQUIS) 2.5 MG TABS tablet Take 1 tablet (2.5 mg total) by mouth 2 (two) times daily. 07/23/16  Yes Gladstone Lighter, MD  atorvastatin (LIPITOR) 20 MG tablet Take 20 mg by mouth at bedtime.    Yes [provider]  diphenhydrAMINE (BENADRYL) 25 MG tablet Take 25 mg by mouth daily as needed for itching or allergies.   Yes [provider]  ferrous sulfate 325 (65 FE) MG tablet Take 1 tablet (325 mg total) by mouth daily with breakfast. 06/03/16  Yes Lorella Nimrod, MD  gabapentin (NEURONTIN) 300 MG capsule Take 300 mg by mouth at bedtime.    Yes [provider]  insulin degludec (TRESIBA FLEXTOUCH) 100 UNIT/ML SOPN FlexTouch Pen Inject 10 Units into the skin every morning.   Yes [provider]  Insulin Detemir (LEVEMIR) 100 UNIT/ML Pen Inject 8 Units into the skin every morning. 06/02/16  Yes Lorella Nimrod, MD  lisinopril (PRINIVIL,ZESTRIL) 20 MG tablet Take 1 tablet (20 mg total) by mouth daily. 07/24/16  Yes Gladstone Lighter, MD  magnesium oxide (MAG-OX) 400 (241.3 Mg) MG tablet Take 1 tablet (400 mg total) by mouth daily. 04/27/16  Yes Wieting, Richard, MD  meloxicam (MOBIC) 7.5 MG tablet Take 7.5 mg by mouth daily.   Yes [provider]  metoCLOPramide (REGLAN) 5 MG tablet Take 1 tablet (5 mg total) by mouth 2 (two) times daily. 06/02/16  Yes Lorella Nimrod, MD  metoprolol tartrate (LOPRESSOR) 25 MG tablet Take 1 tablet (25  mg total) by mouth 2 (two) times daily. 07/24/16  Yes Gladstone Lighter, MD  Multiple Vitamin (MULTIVITAMIN WITH MINERALS) TABS tablet Take 1 tablet by mouth daily. 06/02/16  Yes Shela Leff, MD  ondansetron (ZOFRAN-ODT) 4 MG disintegrating tablet Take 4 mg by mouth every 6 (six) hours as needed for nausea or vomiting.   Yes [provider]  pantoprazole (PROTONIX) 40 MG tablet Take 1 tablet (40 mg total) by mouth 2 (two) times  daily. Patient taking differently: Take 40 mg by mouth daily.  04/27/16  Yes Wieting, Richard, MD  polyethylene glycol [facility]) packet Take 17 g by mouth daily as needed. 04/27/16  Yes Wieting, Richard, MD  sertraline (ZOLOFT) 25 MG tablet Take 1 tablet (25 mg total) by mouth daily. 06/02/16  Yes Lorella Nimrod, MD  sucralfate (CARAFATE) 1 GM/10ML suspension Take 10 mLs (1 g total) by mouth 4 (four) times daily -  with meals and at bedtime. 06/02/16  Yes Lorella Nimrod, MD  tamsulosin (FLOMAX) 0.4 MG CAPS capsule Take 1 capsule (0.4 mg total) by mouth daily. Patient taking differently: Take 0.4 mg by mouth daily after lunch.  03/14/16  Yes Fritzi Mandes, MD    ALLERGIES:  No Known Allergies   SOCIAL HISTORY:  Social History   Social History  . Marital status: Single    Spouse name: N/A  . Number of children: N/A  . Years of education: N/A   Occupational History  . Not on file.   Social History Main Topics  . Smoking status: Never Smoker  . Smokeless tobacco: Never Used  . Alcohol use No  . Drug use: No  . Sexual activity: No   Other Topics Concern  . Not on file   Social History Narrative  . No narrative on file    The patient currently resides (home / rehab facility / nursing home): Home with adult children  The patient normally is (ambulatory / bedbound): Ambulatory with assist, history of multiple falls from standing  FAMILY HISTORY:  Family History  Problem Relation Age of Onset  . Diabetes Mother   . Cancer Mother   . Cancer Father   . Bladder Cancer Neg Hx   . Kidney cancer Neg Hx    REVIEW OF SYSTEMS:  Constitutional: denies weight loss, fever, chills, or sweats  Eyes: denies any other vision changes, history of eye injury  ENT: denies sore throat, hearing problems  Respiratory: denies shortness of breath, wheezing  Cardiovascular: denies chest pain, palpitations  Gastrointestinal: abdominal pain, N/V, and bowel function as per HPI Genitourinary: denies burning  with urination or urinary frequency Musculoskeletal: denies any other joint pains or cramps  Skin: denies any other rashes or skin discolorations  Neurological: denies any other headache, dizziness, weakness  Psychiatric: denies any other depression, anxiety   All other review of systems were negative   VITAL SIGNS:  Temp:  [97.5 F (36.4 C)-98.7 F (37.1 C)] 97.5 F (36.4 C) (07/22 1212) Pulse Rate:  [62-78] 78 (07/22 1212) Resp:  [16-20] 18 (07/22 1212) BP: (102-157)/(36-60) 157/49 (07/22 1212) SpO2:  [95 %-100 %] 100 % (07/22 1212)     Height: 5\' 4"  (162.6 cm) Weight: 123 lb 14.4 oz (56.2 kg) BMI (Calculated): 21.3   INTAKE/OUTPUT:  This shift: Total I/O In: 1093.8 [P.O.:480; I.V.:613.8] Out: -   Last 2 shifts: @IOLAST2SHIFTS @   PHYSICAL EXAM:  Constitutional:  -- Frail body habitus  -- Awake, alert, and oriented x3  Eyes:  -- Pupils equally round  and reactive to light  -- No scleral icterus  Ear, nose, and throat:  -- No jugular venous distension  Pulmonary:  -- No crackles  -- Equal breath sounds bilaterally -- Breathing non-labored at rest Cardiovascular:  -- S1, S2 present  -- No pericardial rubs Gastrointestinal:  -- Abdomen soft, nontender, nondistended, no guarding/rebound  -- No abdominal masses appreciated, pulsatile or otherwise Anorectal: -- Rectal tone WNL, no stool in rectal vault, no visible blood appreciated Musculoskeletal and Integumentary:  -- Wounds or skin discoloration: None appreciated -- Extremities: B/L UE and LE FROM, hands and feet warm, no edema  Neurologic:  -- Motor function: intact and symmetric -- Sensation: intact and symmetric  Labs:  CBC Latest Ref Rng & Units 07/29/2016 07/28/2016 07/22/2016  WBC 3.6 - 11.0 K/uL 8.2 4.4 8.1  Hemoglobin 12.0 - 16.0 g/dL 12.3 12.2 13.4  Hematocrit 35.0 - 47.0 % 36.2 36.9 39.7  Platelets 150 - 440 K/uL 289 306 358   CMP Latest Ref Rng & Units 07/30/2016 07/29/2016 07/28/2016  Glucose 65 - 99  mg/dL - 161(H) 205(H)  BUN 6 - 20 mg/dL - 14 16  Creatinine 0.44 - 1.00 mg/dL - 0.98 1.01(H)  Sodium 135 - 145 mmol/L - 139 135  Potassium 3.5 - 5.1 mmol/L 3.8 3.3(L) 3.3(L)  Chloride 101 - 111 mmol/L - 98(L) 103  CO2 22 - 32 mmol/L - 32 26  Calcium 8.9 - 10.3 mg/dL - 8.4(L) 8.0(L)  Total Protein 6.5 - 8.1 g/dL - - 5.5(L)  Total Bilirubin 0.3 - 1.2 mg/dL - - 0.6  Alkaline Phos 38 - 126 U/L - - 79  AST 15 - 41 U/L - - 21  ALT 14 - 54 U/L - - 9(L)    Albumin: 2.7 (07/28/2016)  Imaging studies:  CT Abdomen and Pelvis Without Contrast (07/28/2016) - personally reviewed and interpretted 1. Large amount of stool throughout the colon consistent with constipation. No evidence of bowel obstruction or perforation. 2. New small bilateral pleural effusions with mild bibasilar atelectasis. 3. Moderate size fluid-filled hiatal hernia. 4. Persistent abnormal appearance of the right renal pelvis and upper pole calices worrisome for transitional carcinoma as described on previous studies. 5.  Aortic Atherosclerosis  Assessment/Plan: (ICD-10's: K56.04) 83 y.o. female with severe constipation without evidence of small bowel obstruction, complicated by pertinent comorbidities including malnutrition, DM on chronic insulin therapy, HTN, HLD, GERD, chronic anemia with iron supplementation, and Eliquis for history of PE.   - no surgical intervention indicated at this time  - correct electrolytes and appreciate gastroenterology consultation  - aggressive bowel regimen (above and below), osmotic bowel prep indicated  - medical management of comorbidities per primary medical team  - ambulation encouraged, DVT prophylaxis  All of the above findings and recommendations were discussed with the patient and her RN, and all of patient's questions were answered to her expressed satisfaction.  Thank you for the opportunity to participate in this patient's care.   -- Marilynne Drivers Rosana Hoes, MD, [place]:  [facility] - Partnering for exceptional care. Office: [phone]

## 2016-07-30 NOTE — Consult Note (Signed)
Reason for Consult:Vomiting Referring Physician: Dr. Bethann Berkshire Sherry [name] is an 83 y.o. female.  HPI: Seen in consultation at the request of Dr. Ether Griffins for further evaluation of vomiting. The history has baseline confusion and the details that she offers me during her history do not match the history documented in the chart. The history is obtained through review of her electronic record and my discussion with Marliss Czar, her bedside nurse. She has a history of PE on Eliquis, hypertension, and diabetes. She was hospitalized 07/22/16-07/24/16 for symptomatic atrial fibrillation with RVR and was started on amiodarone for rate control. Her initial symptom at that time was nausea.   She was seen in the ED 07/28/16 for 2 weeks of abdominal pain and constipation. A CT of the abdomen/pelvis without contrast showed a large about of stool. She was disimpacted. However, vomited feculent material in the ED. Abdominal films 07/29/16 showed a non-obstructive bowel gas pattern. There are scattered gas and stool patterns.  She has had no further nausea or vomiting following admission until this morning when she reported nuasea to the nurse after eating liquids for breakfast. There is a half empty tupperware container next to the bed that her daughter brought in. It appears that she has eaten some of this food. She had a mushy bowel movement at 3pm yesterday. A GI ROS is otherwise negative.  She is currently on Reglan, Senokot, Colace, Miralax, soap suds enemes.  I am unable to locate any prior endoscopy in the electronic health record.   Labs on admission show a normal BUN and creatinine. She has a normal CBC. Lipase and liver enzymes are normal.   Past Medical History:  Diagnosis Date  . Diabetes mellitus without complication ([place])   . Hyperlipidemia   . Hypertension   . Pulmonary embolism [facility])     Past Surgical History:  Procedure Laterality Date  . APPENDECTOMY    . BACK SURGERY    .  ESOPHAGOGASTRODUODENOSCOPY N/A 05/29/2016   Procedure: ESOPHAGOGASTRODUODENOSCOPY (EGD);  Surgeon: Clarene Essex, MD;  Location: [facility];  Service: Gastroenterology;  Laterality: N/A;  . SHOULDER SURGERY      Family History  Problem Relation Age of Onset  . Diabetes Mother   . Cancer Mother   . Cancer Father   . Bladder Cancer Neg Hx   . Kidney cancer Neg Hx     Social History:  reports that she has never smoked. She has never used smokeless tobacco. She reports that she does not drink alcohol or use drugs.  Allergies: No Known Allergies  Medications:  I have reviewed the patient's current medications. Prior to Admission:  Prescriptions Prior to Admission  Medication Sig Dispense Refill Last Dose  . amiodarone (PACERONE) 200 MG tablet Take 1 tablet (200 mg total) by mouth 2 (two) times daily. Change to q daily in 2 weeks 60 tablet 2 07/28/2016 at am  . amLODipine (NORVASC) 5 MG tablet Take 5 mg by mouth daily.   07/28/2016 at am  . apixaban (ELIQUIS) 2.5 MG TABS tablet Take 1 tablet (2.5 mg total) by mouth 2 (two) times daily. 60 tablet 2 07/28/2016 at am  . atorvastatin (LIPITOR) 20 MG tablet Take 20 mg by mouth at bedtime.    07/27/2016 at qhs  . diphenhydrAMINE (BENADRYL) 25 MG tablet Take 25 mg by mouth daily as needed for itching or allergies.   prn at prn  . ferrous sulfate 325 (65 FE) MG tablet Take 1 tablet (325 mg total) by  mouth daily with breakfast. 60 tablet 3 07/28/2016 at am  . gabapentin (NEURONTIN) 300 MG capsule Take 300 mg by mouth at bedtime.    07/27/2016 at qhs  . insulin degludec (TRESIBA FLEXTOUCH) 100 UNIT/ML SOPN FlexTouch Pen Inject 10 Units into the skin every morning.   07/28/2016 at am  . Insulin Detemir (LEVEMIR) 100 UNIT/ML Pen Inject 8 Units into the skin every morning. 15 mL 0 07/28/2016 at am  . lisinopril (PRINIVIL,ZESTRIL) 20 MG tablet Take 1 tablet (20 mg total) by mouth daily. 30 tablet 2 07/28/2016 at am  . magnesium oxide (MAG-OX) 400 (241.3 Mg) MG  tablet Take 1 tablet (400 mg total) by mouth daily. 30 tablet 0 07/28/2016 at am  . meloxicam (MOBIC) 7.5 MG tablet Take 7.5 mg by mouth daily.   07/28/2016 at am  . metoCLOPramide (REGLAN) 5 MG tablet Take 1 tablet (5 mg total) by mouth 2 (two) times daily. 60 tablet 2 07/28/2016 at am  . metoprolol tartrate (LOPRESSOR) 25 MG tablet Take 1 tablet (25 mg total) by mouth 2 (two) times daily. 60 tablet 2 07/28/2016 at am  . Multiple Vitamin (MULTIVITAMIN WITH MINERALS) TABS tablet Take 1 tablet by mouth daily. 30 tablet 2 07/28/2016 at am  . ondansetron (ZOFRAN-ODT) 4 MG disintegrating tablet Take 4 mg by mouth every 6 (six) hours as needed for nausea or vomiting.   prn at prn  . pantoprazole (PROTONIX) 40 MG tablet Take 1 tablet (40 mg total) by mouth 2 (two) times daily. (Patient taking differently: Take 40 mg by mouth daily. ) 60 tablet 0 07/28/2016 at am  . polyethylene glycol (MIRALAX) packet Take 17 g by mouth daily as needed. 30 each 0 prn at prn  . sertraline (ZOLOFT) 25 MG tablet Take 1 tablet (25 mg total) by mouth daily. 30 tablet 2 07/28/2016 at am  . sucralfate (CARAFATE) 1 GM/10ML suspension Take 10 mLs (1 g total) by mouth 4 (four) times daily -  with meals and at bedtime. 420 mL 0 prn at prn  . tamsulosin (FLOMAX) 0.4 MG CAPS capsule Take 1 capsule (0.4 mg total) by mouth daily. (Patient taking differently: Take 0.4 mg by mouth daily after lunch. ) 30 capsule 0 07/28/2016 at Unknown time   Scheduled: . amiodarone  200 mg Oral BID  . amLODipine  5 mg Oral Daily  . apixaban  2.5 mg Oral BID  . docusate sodium  100 mg Oral BID  . insulin aspart  0-9 Units Subcutaneous Q6H  . lisinopril  20 mg Oral Daily  . magnesium oxide  400 mg Oral Daily  . mouth rinse  15 mL Mouth Rinse BID  . metoCLOPramide (REGLAN) injection  5 mg Intravenous Q8H  . metoprolol tartrate  25 mg Oral BID  . pantoprazole  40 mg Oral Daily  . polyethylene glycol  17 g Oral Daily  . senna  1 tablet Oral Daily  .  sertraline  25 mg Oral Daily   Continuous: . dextrose 5 % and 0.9 % NaCl with KCl 20 mEq/L 75 mL/hr at 07/30/16 1042   ZOX:[id number] **OR** acetaminophen, ondansetron **OR** ondansetron (ZOFRAN) IV  Results for orders placed or performed during the hospital encounter of 07/28/16 (from the past 48 hour(s))  Lipase, blood     Status: None   Collection Time: 07/28/16  3:15 PM  Result Value Ref Range   Lipase 22 11 - 51 U/L  Comprehensive metabolic panel     Status: Abnormal  Collection Time: 07/28/16  3:15 PM  Result Value Ref Range   Sodium 135 135 - 145 mmol/L   Potassium 3.3 (L) 3.5 - 5.1 mmol/L   Chloride 103 101 - 111 mmol/L   CO2 26 22 - 32 mmol/L   Glucose, Bld 205 (H) 65 - 99 mg/dL   BUN 16 6 - 20 mg/dL   Creatinine, Ser 1.01 (H) 0.44 - 1.00 mg/dL   Calcium 8.0 (L) 8.9 - 10.3 mg/dL   Total Protein 5.5 (L) 6.5 - 8.1 g/dL   Albumin 2.7 (L) 3.5 - 5.0 g/dL   AST 21 15 - 41 U/L   ALT 9 (L) 14 - 54 U/L   Alkaline Phosphatase 79 38 - 126 U/L   Total Bilirubin 0.6 0.3 - 1.2 mg/dL   GFR calc non Af Amer 49 (L) >60 mL/min   GFR calc Af Amer 57 (L) >60 mL/min    Comment: (NOTE) The eGFR has been calculated using the CKD EPI equation. This calculation has not been validated in all clinical situations. eGFR's persistently <60 mL/min signify possible Chronic Kidney Disease.    Anion gap 6 5 - 15  CBC     Status: Abnormal   Collection Time: 07/28/16  3:15 PM  Result Value Ref Range   WBC 4.4 3.6 - 11.0 K/uL   RBC 4.50 3.80 - 5.20 MIL/uL   Hemoglobin 12.2 12.0 - 16.0 g/dL   HCT 36.9 35.0 - 47.0 %   MCV 82.2 80.0 - 100.0 fL   MCH 27.0 26.0 - 34.0 pg   MCHC 32.9 32.0 - 36.0 g/dL   RDW 14.9 (H) 11.5 - 14.5 %   Platelets 306 150 - 440 K/uL  Troponin I     Status: None   Collection Time: 07/28/16  3:15 PM  Result Value Ref Range   Troponin I <0.03 <0.03 ng/mL  Glucose, capillary     Status: Abnormal   Collection Time: 07/28/16  9:10 PM  Result Value Ref Range    Glucose-Capillary 247 (H) 65 - 99 mg/dL  Glucose, capillary     Status: Abnormal   Collection Time: 07/29/16 12:19 AM  Result Value Ref Range   Glucose-Capillary 226 (H) 65 - 99 mg/dL  Basic metabolic panel     Status: Abnormal   Collection Time: 07/29/16  4:05 AM  Result Value Ref Range   Sodium 139 135 - 145 mmol/L   Potassium 3.3 (L) 3.5 - 5.1 mmol/L   Chloride 98 (L) 101 - 111 mmol/L   CO2 32 22 - 32 mmol/L   Glucose, Bld 161 (H) 65 - 99 mg/dL   BUN 14 6 - 20 mg/dL   Creatinine, Ser 0.98 0.44 - 1.00 mg/dL   Calcium 8.4 (L) 8.9 - 10.3 mg/dL   GFR calc non Af Amer 51 (L) >60 mL/min   GFR calc Af Amer 59 (L) >60 mL/min    Comment: (NOTE) The eGFR has been calculated using the CKD EPI equation. This calculation has not been validated in all clinical situations. eGFR's persistently <60 mL/min signify possible Chronic Kidney Disease.    Anion gap 9 5 - 15  CBC     Status: None   Collection Time: 07/29/16  4:05 AM  Result Value Ref Range   WBC 8.2 3.6 - 11.0 K/uL   RBC 4.43 3.80 - 5.20 MIL/uL   Hemoglobin 12.3 12.0 - 16.0 g/dL   HCT 36.2 35.0 - 47.0 %   MCV 81.6 80.0 -  100.0 fL   MCH 27.6 26.0 - 34.0 pg   MCHC 33.9 32.0 - 36.0 g/dL   RDW 14.4 11.5 - 14.5 %   Platelets 289 150 - 440 K/uL  Magnesium     Status: None   Collection Time: 07/29/16  4:05 AM  Result Value Ref Range   Magnesium 1.9 1.7 - 2.4 mg/dL  Glucose, capillary     Status: Abnormal   Collection Time: 07/29/16  6:47 AM  Result Value Ref Range   Glucose-Capillary 186 (H) 65 - 99 mg/dL  Glucose, capillary     Status: Abnormal   Collection Time: 07/29/16 12:15 PM  Result Value Ref Range   Glucose-Capillary 170 (H) 65 - 99 mg/dL   Comment 1 Notify RN   Glucose, capillary     Status: Abnormal   Collection Time: 07/29/16  5:43 PM  Result Value Ref Range   Glucose-Capillary 153 (H) 65 - 99 mg/dL  Glucose, capillary     Status: Abnormal   Collection Time: 07/30/16 12:54 AM  Result Value Ref Range    Glucose-Capillary 238 (H) 65 - 99 mg/dL  Potassium     Status: None   Collection Time: 07/30/16  3:26 AM  Result Value Ref Range   Potassium 3.8 3.5 - 5.1 mmol/L  Magnesium     Status: None   Collection Time: 07/30/16  3:26 AM  Result Value Ref Range   Magnesium 1.7 1.7 - 2.4 mg/dL  Glucose, capillary     Status: Abnormal   Collection Time: 07/30/16  6:15 AM  Result Value Ref Range   Glucose-Capillary 142 (H) 65 - 99 mg/dL  Glucose, capillary     Status: Abnormal   Collection Time: 07/30/16 12:13 PM  Result Value Ref Range   Glucose-Capillary 222 (H) 65 - 99 mg/dL   Comment 1 Notify RN     Ct Abdomen Pelvis Wo Contrast  Result Date: 07/28/2016 CLINICAL DATA:  83 year old with low abdominal pain radiating to the back, constipation and vomiting for 2 weeks. EXAM: CT ABDOMEN AND PELVIS WITHOUT CONTRAST TECHNIQUE: Multidetector CT imaging of the abdomen and pelvis was performed following the standard protocol without IV contrast. COMPARISON:  CT 07/13/2016 and 06/22/2016.  Radiographs 07/18/2016. FINDINGS: Lower chest: Moderate size hiatal hernia has slightly enlarged compared with the prior study and is fluid-filled. There are new small right-greater-than-left pleural effusions with mild dependent atelectasis at both lung bases. Aortic and coronary artery atherosclerosis are present. Hepatobiliary: The liver is normal in density without focal abnormality. No evidence of gallstones, gallbladder wall thickening or biliary dilatation. Pancreas: Atrophied without focal abnormality or surrounding inflammation. Spleen: Normal in size without focal abnormality. Adrenals/Urinary Tract: Both adrenal glands appear normal. Persistent abnormal appearance of the right kidney with distention of the upper pole calices and pelvis, previously evaluated 06/22/2016. There are faint calcifications within the renal pelvis. The left kidney demonstrates no suspicious findings. The bladder appears unremarkable.  Stomach/Bowel: No evidence of bowel wall thickening, distention or surrounding inflammatory change. There is large amount of stool throughout the colon, especially distally in the rectum. Vascular/Lymphatic: There are no enlarged abdominal or pelvic lymph nodes. Diffuse aortic and branch vessel atherosclerosis. Reproductive: The uterus and adnexa appear unremarkable. Other: No evidence of abdominal wall mass or hernia. No ascites. Musculoskeletal: No acute or significant osseous findings. Chronic L3 compression fracture and chronic spondylosis at L4-5 are stable. IMPRESSION: 1. Large amount of stool throughout the colon consistent with constipation. No evidence of bowel obstruction or  perforation. 2. New small bilateral pleural effusions with mild bibasilar atelectasis. 3. Moderate size fluid-filled hiatal hernia. 4. Persistent abnormal appearance of the right renal pelvis and upper pole calices worrisome for transitional carcinoma as described on previous studies. 5.  Aortic Atherosclerosis (ICD10-I70.0). Electronically Signed   By: Richardean Sale M.D.   On: 07/28/2016 16:33   Dg Abd 1 View  Result Date: 07/29/2016 CLINICAL DATA:  Ileus, abdominal soreness, diabetes mellitus, hypertension EXAM: ABDOMEN - 1 VIEW COMPARISON:  CT abdomen and pelvis 07/28/2016 FINDINGS: Scattered gas and stool throughout colon. No bowel dilatation or bowel wall thickening. Atelectasis at LEFT lung base. Osseous demineralization with degenerative changes lumbar spine and chronic L3 compression deformity. No definite urinary tract calcification. Scattered tiny pelvic phleboliths. IMPRESSION: Nonobstructive bowel gas pattern. Electronically Signed   By: Lavonia Dana M.D.   On: 07/29/2016 10:42    Review of Systems  Unable to perform ROS: Mental status change   Blood pressure (!) 157/49, pulse 78, temperature (!) 97.5 F (36.4 C), temperature source Oral, resp. rate 18, height 5' 4"  (1.626 m), weight 123 lb 14.4 oz (56.2 kg),  SpO2 100 %. Physical Exam  Nursing note and vitals reviewed. Constitutional: She is oriented to person, place, and time. She appears well-developed and well-nourished.  Appears her stated age  HENT:  Head: Normocephalic and atraumatic.  Mouth/Throat: No oropharyngeal exudate.  Eyes: Conjunctivae are normal.  Neck: Neck supple. No thyromegaly present.  Cardiovascular: Normal rate and regular rhythm.   Respiratory: Effort normal and breath sounds normal.  GI: Soft. Bowel sounds are normal. She exhibits no distension and no mass. There is tenderness (right sided abdominal pain that improves with distraction). There is no rebound and no guarding.  Musculoskeletal: Normal range of motion. She exhibits no edema.  Neurological: She is alert and oriented to person, place, and time.  Oriented x 3, but, unable to provide many details about her history including the events of this morning. Reviewed with her nurse who agrees that the patient is confused.  Skin: Skin is warm and dry. Rash noted.  Psychiatric: She has a normal mood and affect. Thought content normal.    Assessment/Plan: Nausea    - present for weeks    - initially identified with onset of A Fib    - Normal CBC, lipase, LFTs Recent constipation/impaction Abnormal right renal pelvis on CT Moderate sized hiatal hernia Recent diagnosis of Afib with RVR started on amiodarone History of PE on Eliquis Diabetes  Etiology of nausea is unclear. Differential is broad and may be multifactorial: side effect of medication (amiodarone recently started, although also associated with amlodipine and Eliquis), manifestation of cardiac disease (present with her presentation of A fib), related to a possible renal malignancy, hiatal hernia, constipation, and pancreaticobiliary disease. Recent CT was not contrasted.   Recommend: minimizing medications associated with nausea - is there a possible alternative to amiodarone?, obtain an ultrasound to  further evaluation the GB and pancreaticobiliary tree, consider evaluation of renal system(deferred to the primary team), and continue an aggressive bowel regimen.     Recommend empiric PPI therapy and antiemetics for symptom control. I believe endoscopy is low yield at this time, and would want to minimize risk in this elderly, frail woman. However, would pursue an EGD with persistent unexplained symptoms or the development of any alarm features.   [place] 07/30/2016, 3:09 PM

## 2016-07-31 ENCOUNTER — Observation Stay: Payer: Medicare Other

## 2016-07-31 DIAGNOSIS — R110 Nausea: Secondary | ICD-10-CM

## 2016-07-31 DIAGNOSIS — K5904 Chronic idiopathic constipation: Secondary | ICD-10-CM

## 2016-07-31 DIAGNOSIS — K5641 Fecal impaction: Secondary | ICD-10-CM

## 2016-07-31 LAB — GLUCOSE, CAPILLARY
GLUCOSE-CAPILLARY: 180 mg/dL — AB (ref 65–99)
Glucose-Capillary: 154 mg/dL — ABNORMAL HIGH (ref 65–99)
Glucose-Capillary: 164 mg/dL — ABNORMAL HIGH (ref 65–99)
Glucose-Capillary: 183 mg/dL — ABNORMAL HIGH (ref 65–99)
Glucose-Capillary: 216 mg/dL — ABNORMAL HIGH (ref 65–99)

## 2016-07-31 NOTE — Progress Notes (Signed)
[place] [facility] [employer] NAME: Sherry [name]    MR#:  [id number]  DATE OF BIRTH:  [birth year]  SUBJECTIVE:  CHIEF COMPLAINT:   Chief Complaint  Patient presents with  . Abdominal Pain  . Constipation   The patient is a 83 year old Caucasian female with past medical history significant with history of diabetes, hypertension, hyperlipidemia, pulmonary embolism, who presents to the hospital with abdominal discomfort, constipation. 2 weeks. CT of the abdomen and pelvis revealed fecal impaction, significant stool burden, disimpaction was performed. In emergency room, but patient continued to have nausea and intermittent vomiting, she vomited feculent-appearing material On arrival to the floor. She denies Significant abdominal pain at present, but Continues to have nausea, Gagging and vomiting. Not able to tolerate diet. Urinalysis was not taken, ordered again, pending collection  Review of Systems  Constitutional: Negative for chills, fever and weight loss.  HENT: Negative for congestion.   Eyes: Negative for blurred vision and double vision.  Respiratory: Negative for cough, sputum production, shortness of breath and wheezing.   Cardiovascular: Negative for chest pain, palpitations, orthopnea, leg swelling and PND.  Gastrointestinal: Positive for nausea. Negative for abdominal pain, blood in stool, constipation, diarrhea and vomiting.  Genitourinary: Negative for dysuria, frequency, hematuria and urgency.  Musculoskeletal: Negative for falls.  Neurological: Negative for dizziness, tremors, focal weakness and headaches.  Endo/Heme/Allergies: Does not bruise/bleed easily.  Psychiatric/Behavioral: Negative for depression. The patient does not have insomnia.     VITAL SIGNS: Blood pressure (!) 158/43, pulse 71, temperature 98.3 F (36.8 C), temperature source Oral, resp. rate 20, height 5\' 4"  (1.626 m), weight 56.2 kg (123 lb 14.4 oz), SpO2 94  %.  PHYSICAL EXAMINATION:   GENERAL:  83 y.o.-year-old patient lying in the bed in no acute distress, however, uncomfortable and somewhat sad appearing.  EYES: Pupils equal, round, reactive to light and accommodation. No scleral icterus. Extraocular muscles intact.  HEENT: Head atraumatic, normocephalic. Oropharynx and nasopharynx clear.  NECK:  Supple, no jugular venous distention. No thyroid enlargement, no tenderness.  LUNGS: Normal breath sounds bilaterally, no wheezing, rales,rhonchi or crepitation. No use of accessory muscles of respiration.  CARDIOVASCULAR: S1, S2 normal. No murmurs, rubs, or gallops.  ABDOMEN: Soft, nontender, nondistended. Bowel sounds present. No organomegaly or mass.  EXTREMITIES: No pedal edema, cyanosis, or clubbing.  NEUROLOGIC: Cranial nerves II through XII are intact. Muscle strength 5/5 in all extremities. Sensation intact. Gait not checked.  PSYCHIATRIC: The patient is alert and oriented x 3.  SKIN: No obvious rash, lesion, or ulcer.   ORDERS/RESULTS REVIEWED:   CBC  Recent Labs Lab 07/28/16 1515 07/29/16 0405  WBC 4.4 8.2  HGB 12.2 12.3  HCT 36.9 36.2  PLT 306 289  MCV 82.2 81.6  MCH 27.0 27.6  MCHC 32.9 33.9  RDW 14.9* 14.4   ------------------------------------------------------------------------------------------------------------------  Chemistries   Recent Labs Lab 07/28/16 1515 07/29/16 0405 07/30/16 0326  NA 135 139  --   K 3.3* 3.3* 3.8  CL 103 98*  --   CO2 26 32  --   GLUCOSE 205* 161*  --   BUN 16 14  --   CREATININE 1.01* 0.98  --   CALCIUM 8.0* 8.4*  --   MG  --  1.9 1.7  AST 21  --   --   ALT 9*  --   --   ALKPHOS 79  --   --   BILITOT 0.6  --   --    ------------------------------------------------------------------------------------------------------------------  estimated creatinine clearance is 35.6 mL/min (by C-G formula based on SCr of 0.98  mg/dL). ------------------------------------------------------------------------------------------------------------------ No results for input(s): TSH, T4TOTAL, T3FREE, THYROIDAB in the last 72 hours.  Invalid input(s): FREET3  Cardiac Enzymes  Recent Labs Lab 07/28/16 1515  TROPONINI <0.03   ------------------------------------------------------------------------------------------------------------------ Invalid input(s): POCBNP ---------------------------------------------------------------------------------------------------------------  RADIOLOGY: US Abdomen Complete  Result Date: 07/31/2016 CLINICAL DATA:  Nausea for 4 days. EXAM: ABDOMEN ULTRASOUND COMPLETE COMPARISON:  CT of the abdomen and pelvis 07/28/2016 FINDINGS: Gallbladder: Gallbladder wall is normal thickness, 2 mm. No sonographic Murphy's sign. Gallbladder sludge is present. No stones or pericholecystic fluid. Common bile duct: Diameter: 5.0 mm Liver: No focal lesion identified. Within normal limits in parenchymal echogenicity. IVC: No abnormality visualized. Pancreas: Visualized portion unremarkable. Spleen: Size and appearance within normal limits. Right Kidney: Length: 8.3 cm. Normal echogenicity. No mass. Mild dilatation of upper and midpole calices and right renal pelvis. Left Kidney: Length: 9.3 cm. Echogenicity within normal limits. No mass or hydronephrosis visualized. Abdominal aorta: No aneurysm visualized. Other findings: Trace amount of ascites in the left upper quadrant. IMPRESSION: 1. No evidence for acute cholecystitis. 2. Stable mild dilatation of right renal calices and renal pelvis. No renal lesions identified. 3. Trace amount of ascites in the left upper quadrant. Electronically Signed   By: Nolon Nations M.D.   On: 07/31/2016 08:12    EKG:  Orders placed or performed in visit on 07/28/16  . EKG 12-Lead  . EKG 12-Lead  . EKG 12-Lead    ASSESSMENT AND PLAN:  Principal Problem:   Fecal impaction of  colon ([place]) Active Problems:   Diabetes mellitus type 2, insulin dependent ([place])   Chronic kidney disease (CKD), stage III (moderate)   Essential hypertension   HLD (hyperlipidemia)   Fecal impaction ([place])  #1. Fecal impaction, partial pseudoobstruction with resultant nausea and vomiting, KUB showed no significant abnormalities, continue patient on Reglan, Senokot, Colace, MiraLAX, soap suds enemas, discussed with Dr. Vicente Males of gastroenterology today, recommended to continue daily MiraLAX, PPI, advance diet as tolerated #2. Hypokalemia, supplemented intravenously, resolved, magnesium level was low normal at 1.7, given oral supplementation  #3 acute renal insufficiency, resolved, urinalysis is pending, repeated orders written. Get urinary culture if abnormal appearance #4. Diabetes mellitus type 2 continue sliding scale insulin,, no hypoglycemia today, watch closely with poor oral intake  Management plans discussed with the patient, family and they are in agreement.   DRUG ALLERGIES: No Known Allergies  CODE STATUS:     Code Status Orders        Start     Ordered   07/28/16 2106  Full code  Continuous     07/28/16 2106    Code Status History    Date Active Date Inactive Code Status Order ID Comments User Context   07/22/2016  4:13 AM 07/24/2016  5:43 PM Full Code [id number]  Harrie Foreman, MD Inpatient   07/12/2016 10:45 PM 07/18/2016  8:32 PM Full Code [id number]  Hugelmeyer, Ubaldo Glassing, DO Inpatient   05/28/2016 12:01 PM 06/02/2016  7:32 PM Full Code [id number]  Lorella Nimrod, MD Inpatient   04/24/2016  7:27 PM 04/27/2016  8:08 PM Full Code [id number]  Loletha Grayer, MD ED   04/19/2016 12:50 AM 04/22/2016 12:10 AM Full Code [id number]  Lance Coon, MD Inpatient   03/27/2016  9:39 PM 03/29/2016  9:41 PM Full Code [id number]  Fritzi Mandes, MD ED   03/12/2016 12:21 PM 03/13/2016  8:32 PM Full Code [id number]  Dustin Flock, MD  Inpatient   03/09/2016  4:33 PM 03/12/2016 12:21 PM DNR [id number]  Nicholes Mango, MD ED   02/26/2016  9:39 PM 03/01/2016 10:37 PM DNR [id number]  Karmen Bongo, MD Inpatient   02/26/2016  7:48 PM 02/26/2016  9:39 PM DNR [id number]  Dorie Rank, MD ED   01/31/2016  5:45 PM 02/07/2016 11:45 PM DNR [id number]  Flora Lipps, MD Inpatient   01/30/2016 11:19 PM 01/31/2016  5:44 PM Full Code [id number]  Hugelmeyer, Ubaldo Glassing, DO Inpatient   11/26/2014  3:52 PM 11/28/2014  3:07 PM Full Code [id number]  Max Sane, MD Inpatient   11/26/2014  8:59 AM 11/26/2014  3:52 PM Full Code [id number]  Max Sane, MD ED    Advance Directive Documentation     Most Recent Value  Type of Advance Directive  Healthcare Power of Attorney, Living will  Pre-existing out of facility DNR order (yellow form or pink MOST form)  -  "MOST" Form in Place?  -      TOTAL TIME TAKING CARE OF THIS PATIENT: 35 minutes. Discussed with Dr. Tacy Dura M.D on 07/31/2016 at 3:18 PM  Between 7am to 6pm - Pager - [phone]  After 6pm go to [email or web address] - password [email or web address] [facility]  [place]  Office  [phone]  CC: Primary care physician; Center, [facility]

## 2016-07-31 NOTE — NC FL2 (Signed)
[place] LEVEL OF CARE SCREENING TOOL     IDENTIFICATION  Patient Name: Sherry [name] Birthdate: 1927/07/16 Sex: female Admission Date (Current Location): 07/28/2016  [place] and [place] Number:  Engineering geologist and Address:  [facility], [address], [place], [place] [address]      Provider Number: [id number]  Attending Physician Name and Address:  Theodoro Grist, MD  Relative Name and Phone Number:       Current Level of Care: Hospital Recommended Level of Care: [place] Prior Approval Number:    Date Approved/Denied:   PASRR Number: [id number]  Discharge Plan: SNF    Current Diagnoses: Patient Active Problem List   Diagnosis Date Noted  . HLD (hyperlipidemia) 07/28/2016  . Fecal impaction of colon ([place]) 07/28/2016  . Fecal impaction ([place]) 07/28/2016  . Atrial fibrillation with RVR ([place]) 07/22/2016  . Dehydration   . Renal mass   . Chest pain, rule out acute myocardial infarction 07/12/2016  . Reactive depression   . Esophagitis determined by endoscopy 05/31/2016  . Hyperglycemia   . Hypokalemia   . Cystitis   . HH (hiatus hernia)   . Hiatal hernia with gastroesophageal reflux disease without esophagitis   . GI bleed 05/28/2016  . Upper GI bleeding 05/28/2016  . Pressure injury of skin 05/28/2016  . Atrial flutter ([place])   . Gastroenteritis 04/24/2016  . Aphasia 04/18/2016  . Nausea & vomiting 03/27/2016  . Advance care planning   . UTI (urinary tract infection) 03/12/2016  . Chest pain 03/09/2016  . PE (pulmonary thromboembolism) ([place]) 02/26/2016  . Diabetes mellitus type 2, insulin dependent ([place]) 02/26/2016  . Anemia 02/26/2016  . Chronic kidney disease (CKD), stage III (moderate) 02/26/2016  . Essential hypertension 02/26/2016  . Goals of care, counseling/discussion   . Adult failure to thrive syndrome   . Palliative care by specialist   . Sepsis ([place]) 11/26/2014    Orientation  RESPIRATION BLADDER Height & Weight     Self, Time, Situation, Place  Normal Incontinent Weight: 123 lb 14.4 oz (56.2 kg) Height:  5\' 4"  (162.6 cm)  BEHAVIORAL SYMPTOMS/MOOD NEUROLOGICAL BOWEL NUTRITION STATUS        Diet (Full Liquid Diet)  AMBULATORY STATUS COMMUNICATION OF NEEDS Skin   Extensive Assist Verbally Normal                       Personal Care Assistance Level of Assistance  Bathing, Feeding, Dressing Bathing Assistance: Limited assistance Feeding assistance: Independent Dressing Assistance: Limited assistance     Functional Limitations Info  Sight, Speech, Hearing Sight Info: Adequate Hearing Info: Adequate Speech Info: Adequate    SPECIAL CARE FACTORS FREQUENCY  PT (By licensed PT)     PT Frequency: 5              Contractures Contractures Info: Not present    Additional Factors Info  Code Status, Allergies, Psychotropic, Insulin Sliding Scale Code Status Info: Full Code Allergies Info: No known allergies Psychotropic Info: Medications:  Zoloft Insulin Sliding Scale Info: every 6 hours       Current Medications (07/31/2016):  This is the current hospital active medication list Current Facility-Administered Medications  Medication Dose Route Frequency Provider Last Rate Last Dose  . acetaminophen (TYLENOL) tablet 650 mg  650 mg Oral Q6H PRN Lance Coon, MD       Or  . acetaminophen (TYLENOL) suppository 650 mg  650 mg Rectal Q6H PRN  Lance Coon, MD      . amiodarone (PACERONE) tablet 200 mg  200 mg Oral BID Lance Coon, MD   200 mg at 07/31/16 1050  . amLODipine (NORVASC) tablet 5 mg  5 mg Oral Daily Lance Coon, MD   5 mg at 07/31/16 1050  . apixaban (ELIQUIS) tablet 2.5 mg  2.5 mg Oral BID Lance Coon, MD   2.5 mg at 07/31/16 1051  . dextrose 5 % and 0.9 % NaCl with KCl 20 mEq/L infusion   Intravenous Continuous Theodoro Grist, MD 75 mL/hr at 07/31/16 1114    . docusate sodium (COLACE) capsule 100 mg  100 mg Oral BID Theodoro Grist, MD   100 mg at 07/31/16 1050  . insulin aspart (novoLOG) injection 0-9 Units  0-9 Units Subcutaneous Q6H Lance Coon, MD   2 Units at 07/31/16 1341  . lisinopril (PRINIVIL,ZESTRIL) tablet 20 mg  20 mg Oral Daily Lance Coon, MD   20 mg at 07/31/16 1050  . MEDLINE mouth rinse  15 mL Mouth Rinse BID Lance Coon, MD   15 mL at 07/29/16 2200  . metoCLOPramide (REGLAN) injection 5 mg  5 mg Intravenous Q8H Theodoro Grist, MD   5 mg at 07/31/16 0607  . metoprolol tartrate (LOPRESSOR) tablet 25 mg  25 mg Oral BID Lance Coon, MD   25 mg at 07/31/16 1050  . ondansetron (ZOFRAN) tablet 4 mg  4 mg Oral Q6H PRN Lance Coon, MD       Or  . ondansetron [facility]) injection 4 mg  4 mg Intravenous Q6H PRN Lance Coon, MD   4 mg at 07/31/16 6144  . pantoprazole (PROTONIX) EC tablet 40 mg  40 mg Oral Daily Lance Coon, MD   40 mg at 07/31/16 1050  . polyethylene glycol (MIRALAX / GLYCOLAX) packet 17 g  17 g Oral Daily Theodoro Grist, MD   17 g at 07/31/16 1056  . senna (SENOKOT) tablet 8.6 mg  1 tablet Oral Daily Theodoro Grist, MD   8.6 mg at 07/31/16 1051  . sertraline (ZOLOFT) tablet 25 mg  25 mg Oral Daily Lance Coon, MD   25 mg at 07/31/16 1056     Discharge Medications: Please see discharge summary for a list of discharge medications.  Relevant Imaging Results:  Relevant Lab Results:   Additional Information SSN:  [id number]  Darden Dates, LCSW

## 2016-07-31 NOTE — Progress Notes (Signed)
CC: impaction  Subjective: No abd pain, had BM and flatus after enema No N/V KUB no evidence of obstruction  Objective: Vital signs in last 24 hours: Temp:  [97.5 F (36.4 C)-98.3 F (36.8 C)] 98.3 F (36.8 C) (07/23 0448) Pulse Rate:  [75-88] 88 (07/23 0448) Resp:  [18-20] 20 (07/23 0448) BP: (148-157)/(48-53) 151/53 (07/23 0448) SpO2:  [93 %-100 %] 93 % (07/23 0448) Last BM Date: 07/29/16  Intake/Output from previous day: 07/22 0701 - 07/23 0700 In: 2323.8 [P.O.:480; I.V.:1843.8] Out: -  Intake/Output this shift: No intake/output data recorded.  Physical exam: Debilitated female in NAD, resting Abd: soft, NT, no peritonitis Ext: no edema, well perfused Neuro: awake and alert GCS 15  Lab Results: CBC   Recent Labs  07/28/16 1515 07/29/16 0405  WBC 4.4 8.2  HGB 12.2 12.3  HCT 36.9 36.2  PLT 306 289   BMET  Recent Labs  07/28/16 1515 07/29/16 0405 07/30/16 0326  NA 135 139  --   K 3.3* 3.3* 3.8  CL 103 98*  --   CO2 26 32  --   GLUCOSE 205* 161*  --   BUN 16 14  --   CREATININE 1.01* 0.98  --   CALCIUM 8.0* 8.4*  --    PT/INR No results for input(s): LABPROT, INR in the last 72 hours. ABG No results for input(s): PHART, HCO3 in the last 72 hours.  Invalid input(s): PCO2, PO2  Studies/Results: Dg Abd 1 View  Result Date: 07/29/2016 CLINICAL DATA:  Ileus, abdominal soreness, diabetes mellitus, hypertension EXAM: ABDOMEN - 1 VIEW COMPARISON:  CT abdomen and pelvis 07/28/2016 FINDINGS: Scattered gas and stool throughout colon. No bowel dilatation or bowel wall thickening. Atelectasis at LEFT lung base. Osseous demineralization with degenerative changes lumbar spine and chronic L3 compression deformity. No definite urinary tract calcification. Scattered tiny pelvic phleboliths. IMPRESSION: Nonobstructive bowel gas pattern. Electronically Signed   By: Lavonia Dana M.D.   On: 07/29/2016 10:42   US Abdomen Complete  Result Date: 07/31/2016 CLINICAL  DATA:  Nausea for 4 days. EXAM: ABDOMEN ULTRASOUND COMPLETE COMPARISON:  CT of the abdomen and pelvis 07/28/2016 FINDINGS: Gallbladder: Gallbladder wall is normal thickness, 2 mm. No sonographic Murphy's sign. Gallbladder sludge is present. No stones or pericholecystic fluid. Common bile duct: Diameter: 5.0 mm Liver: No focal lesion identified. Within normal limits in parenchymal echogenicity. IVC: No abnormality visualized. Pancreas: Visualized portion unremarkable. Spleen: Size and appearance within normal limits. Right Kidney: Length: 8.3 cm. Normal echogenicity. No mass. Mild dilatation of upper and midpole calices and right renal pelvis. Left Kidney: Length: 9.3 cm. Echogenicity within normal limits. No mass or hydronephrosis visualized. Abdominal aorta: No aneurysm visualized. Other findings: Trace amount of ascites in the left upper quadrant. IMPRESSION: 1. No evidence for acute cholecystitis. 2. Stable mild dilatation of right renal calices and renal pelvis. No renal lesions identified. 3. Trace amount of ascites in the left upper quadrant. Electronically Signed   By: Nolon Nations M.D.   On: 07/31/2016 08:12    Anti-infectives: Anti-infectives    None      Assessment/Plan: Fecal impaction No surgical issues at this time We will sign off Caroleen Hamman, MD, [facility]  07/31/2016

## 2016-07-31 NOTE — Progress Notes (Signed)
Sherry Bellows MD, MRCP(U.K) [address]  [place]  [place], [place] [address]  Main: [place] is being followed for nausea  Subjective: Feels better, nausea better    Objective: Vital signs in last 24 hours: Vitals:   07/30/16 0630 07/30/16 1212 07/30/16 2039 07/31/16 0448  BP: (!) 138/48 (!) 157/49 (!) 148/48 (!) 151/53  Pulse:  78 75 88  Resp:  18 18 20   Temp:  (!) 97.5 F (36.4 C) 98.3 F (36.8 C) 98.3 F (36.8 C)  TempSrc:  Oral Oral Oral  SpO2:  100% 97% 93%  Weight:      Height:       Weight change:   Intake/Output Summary (Last 24 hours) at 07/31/16 1053 Last data filed at 07/31/16 0610  Gross per 24 hour  Intake           1747.5 ml  Output                0 ml  Net           1747.5 ml     Exam: Heart:: Regular rate and rhythm, S1S2 present or without murmur or extra heart sounds Lungs: normal Abdomen: soft, nontender, normal bowel sounds   Lab Results: @LABTEST2 @ Micro Results: No results found for this or any previous visit (from the past 240 hour(s)). Studies/Results: US Abdomen Complete  Result Date: 07/31/2016 CLINICAL DATA:  Nausea for 4 days. EXAM: ABDOMEN ULTRASOUND COMPLETE COMPARISON:  CT of the abdomen and pelvis 07/28/2016 FINDINGS: Gallbladder: Gallbladder wall is normal thickness, 2 mm. No sonographic Murphy's sign. Gallbladder sludge is present. No stones or pericholecystic fluid. Common bile duct: Diameter: 5.0 mm Liver: No focal lesion identified. Within normal limits in parenchymal echogenicity. IVC: No abnormality visualized. Pancreas: Visualized portion unremarkable. Spleen: Size and appearance within normal limits. Right Kidney: Length: 8.3 cm. Normal echogenicity. No mass. Mild dilatation of upper and midpole calices and right renal pelvis. Left Kidney: Length: 9.3 cm. Echogenicity within normal limits. No mass or hydronephrosis visualized. Abdominal aorta: No aneurysm visualized. Other findings: Trace amount  of ascites in the left upper quadrant. IMPRESSION: 1. No evidence for acute cholecystitis. 2. Stable mild dilatation of right renal calices and renal pelvis. No renal lesions identified. 3. Trace amount of ascites in the left upper quadrant. Electronically Signed   By: Nolon Nations M.D.   On: 07/31/2016 08:12   Medications: I have reviewed the patient's current medications. Scheduled Meds: . amiodarone  200 mg Oral BID  . amLODipine  5 mg Oral Daily  . apixaban  2.5 mg Oral BID  . docusate sodium  100 mg Oral BID  . insulin aspart  0-9 Units Subcutaneous Q6H  . lisinopril  20 mg Oral Daily  . mouth rinse  15 mL Mouth Rinse BID  . metoCLOPramide (REGLAN) injection  5 mg Intravenous Q8H  . metoprolol tartrate  25 mg Oral BID  . pantoprazole  40 mg Oral Daily  . polyethylene glycol  17 g Oral Daily  . senna  1 tablet Oral Daily  . sertraline  25 mg Oral Daily   Continuous Infusions: . dextrose 5 % and 0.9 % NaCl with KCl 20 mEq/L 75 mL/hr at 07/30/16 2224   PRN Meds:.acetaminophen **OR** acetaminophen, ondansetron **OR** ondansetron (ZOFRAN) IV   Assessment: Principal Problem:   Fecal impaction of colon (HCC) Active Problems:   Diabetes mellitus type 2, insulin dependent (HCC)   Chronic kidney  disease (CKD), stage III (moderate)   Essential hypertension   HLD (hyperlipidemia)   Fecal impaction (HCC)   Sherry [name] 83 y.o. female admitted with fecal impaction , nausea , better after bowel clean out. X ray abdomen shows resolution of the stool burden. She feels better overall    Plan: 1. Continue daily miralax titrated to a soft bowel movement  2. PPI 3. Check urine analysis r/o UTI 3. Keep sugars < 150 mg/dl as it can cause gastroparesis and nausea.  4. Advance diet as tolerated   LOS: 0 days   Sherry [name] 07/31/2016, 10:53 AM

## 2016-08-01 ENCOUNTER — Observation Stay: Payer: Medicare Other

## 2016-08-01 DIAGNOSIS — K5641 Fecal impaction: Secondary | ICD-10-CM | POA: Diagnosis not present

## 2016-08-01 LAB — BASIC METABOLIC PANEL WITH GFR
Anion gap: 6 (ref 5–15)
BUN: 8 mg/dL (ref 6–20)
CO2: 27 mmol/L (ref 22–32)
Calcium: 7.9 mg/dL — ABNORMAL LOW (ref 8.9–10.3)
Chloride: 104 mmol/L (ref 101–111)
Creatinine, Ser: 0.84 mg/dL (ref 0.44–1.00)
GFR calc Af Amer: >60 mL/min
GFR calc non Af Amer: >60 mL/min
Glucose, Bld: 171 mg/dL — ABNORMAL HIGH (ref 65–99)
Potassium: 3.8 mmol/L (ref 3.5–5.1)
Sodium: 137 mmol/L (ref 135–145)

## 2016-08-01 LAB — GLUCOSE, CAPILLARY
GLUCOSE-CAPILLARY: 171 mg/dL — AB (ref 65–99)
GLUCOSE-CAPILLARY: 208 mg/dL — AB (ref 65–99)
Glucose-Capillary: 154 mg/dL — ABNORMAL HIGH (ref 65–99)
Glucose-Capillary: 155 mg/dL — ABNORMAL HIGH (ref 65–99)

## 2016-08-01 MED ORDER — POLYETHYLENE GLYCOL 3350 17 G PO PACK
17.0000 g | PACK | Freq: Two times a day (BID) | ORAL | Status: DC
  Administered 2016-08-01 – 2016-08-02 (×2): 17 g via ORAL
  Filled 2016-08-01 (×2): qty 1

## 2016-08-01 MED ORDER — BISACODYL 10 MG RE SUPP
10.0000 mg | Freq: Every morning | RECTAL | Status: DC
  Administered 2016-08-01 – 2016-08-02 (×2): 10 mg via RECTAL
  Filled 2016-08-01 (×2): qty 1

## 2016-08-01 MED ORDER — TEMAZEPAM 7.5 MG PO CAPS
7.5000 mg | ORAL_CAPSULE | Freq: Every evening | ORAL | Status: DC | PRN
  Administered 2016-08-01: 7.5 mg via ORAL
  Filled 2016-08-01: qty 1

## 2016-08-01 NOTE — Clinical Social Work Note (Signed)
Clinical Social Work Assessment  Patient Details  Name: Sherry [name] MRN: [id number] Date of Birth: 06/03/1927  Date of referral:  08/01/16               Reason for consult:  Facility Placement                Permission sought to share information with:    Permission granted to share information::     Name::        Agency::     Relationship::     Contact Information:     Housing/Transportation Living arrangements for the past 2 months:  Single Family Home Source of Information:  Adult Children Patient Interpreter Needed:  None Criminal Activity/Legal Involvement Pertinent to Current Situation/Hospitalization:  No - Comment as needed Significant Relationships:  Adult Children Lives with:  Adult Children Do you feel safe going back to the place where you live?  Yes Need for family participation in patient care:  Yes (Comment)  Care giving concerns:  Patient resides at home with daughter and with 24/7 care in place.   Social Worker assessment / plan:  CSW contacted patient's daughter: Sherry [name]: [phone] regarding PT recommendation for STR. Patient's daughter states that they already have 24/7 care I place and that patient already has home health as well. She could not readily remember the name but is going to look it up and call the nurse with that information. Patient's daughter stated that patient's hospital bed was delivered to the house yesterday. Patient's daughter states that she will have patient return home and not go to STR. RN CM has been notified.  Employment status:  Retired Nurse, adult PT Recommendations:  [place] / Referral to community resources:     Patient/Family's Response to care:  Patient's daughter expressed appreciation for CSW assistance.  Patient/Family's Understanding of and Emotional Response to Diagnosis, Current Treatment, and Prognosis:  Patient's daughter is aware of patient's limitations  and has 24.7 care in place.  Emotional Assessment Appearance:  Appears stated age Attitude/Demeanor/Rapport:   (calm and confused) Affect (typically observed):  Calm Orientation:  Oriented to Self Alcohol / Substance use:  Not Applicable Psych involvement (Current and /or in the community):  No (Comment)  Discharge Needs  Concerns to be addressed:  Care Coordination Readmission within the last 30 days:  Yes Current discharge risk:  None Barriers to Discharge:  No Barriers Identified   Shela Leff, LCSW 08/01/2016, 12:00 PM

## 2016-08-01 NOTE — Progress Notes (Signed)
Physical Therapy Treatment Patient Details Name: Sherry [name] MRN: [id number] DOB: March 15, 1927 Today's Date: 08/01/2016    History of Present Illness Pt is an 83 y.o.femalewho presents with Abdominal discomfort and reported constipation of 2 weeks' duration. Imaging here in the ED shows some fecal impaction and significant stool burden throughout her entire colon. Some disimpaction was able to be performed in the ED. Patient is actually vomiting feculent-appearing material.  Assessment includes: Fecal impaction with resultant nausea and vomiting, KUB showed no significant abnormalities, hypokalemia, acute renal insufficiency, and DM.    PT Comments    Participated in exercises as described below.  Pt required frequent rest breaks to complete.  Te edge of bed with min assist.  She remained sitting for 2 minutes.  Limited by "funny" feeling and general fatigue/letheray.  Inc medium soft BM and cleaned as appropriate.  BP supine 161/46 dynamap before ex, 150/38 after ex.  Primary nurse Lauren in to take manually.  140/48.  No c/o dizziness in supine but once sitting EOB pt c/o "funny" feeling in head.  BP 132/44 and requested to lay back down.    Pt with teary eyes during session but wanting to participate.  Daughter stated she is feeling sad because she wants to go home but pt does not verbalize.     Follow Up Recommendations  SNF     Equipment Recommendations  None recommended by PT    Recommendations for Other Services       Precautions / Restrictions Precautions Precautions: Fall Precaution Comments: monitor HR/ BP Restrictions Weight Bearing Restrictions: No    Mobility  Bed Mobility Overal bed mobility: Needs Assistance Bed Mobility: Supine to Sit;Sit to Supine;Rolling Rolling: Min guard   Supine to sit: Min assist Sit to supine: Min assist   General bed mobility comments: Extra time and effort required for bed mobility tasks  Transfers                  General transfer comment: deferred due to BP, lethargy,fatigue  Ambulation/Gait                 Stairs            Wheelchair Mobility    Modified Rankin (Stroke Patients Only)       Balance Overall balance assessment: Needs assistance Sitting-balance support: Feet supported;Bilateral upper extremity supported Sitting balance-Leahy Scale: Fair                                      Cognition Arousal/Alertness: Lethargic Behavior During Therapy: WFL for tasks assessed/performed Overall Cognitive Status: Within Functional Limits for tasks assessed                                        Exercises Other Exercises Other Exercises: Supine ther-ex x10 B included ankle pump, SLR's, and hip abd, leg press, quad and glut sets  A/AAROM Other Exercises: inc medium BM, cleaned as appropriate    General Comments        Pertinent Vitals/Pain Pain Assessment: 0-10 Pain Score: 5  Pain Location: Abdominal pain Pain Descriptors / Indicators: Cramping;Discomfort Pain Intervention(s): Limited activity within patient's tolerance;Monitored during session    Home Living  Prior Function            PT Goals (current goals can now be found in the care plan section)      Frequency    Min 2X/week      PT Plan Current plan remains appropriate    Co-evaluation              AM-PAC PT "6 Clicks" Daily Activity  Outcome Measure  Difficulty turning over in bed (including adjusting bedclothes, sheets and blankets)?: A Little Difficulty moving from lying on back to sitting on the side of the bed? : Total Difficulty sitting down on and standing up from a chair with arms (e.g., wheelchair, bedside commode, etc,.)?: Total Help needed moving to and from a bed to chair (including a wheelchair)?: A Lot Help needed walking in hospital room?: A Lot Help needed climbing 3-5 steps with a railing? : Total 6 Click  Score: 10    End of Session   Activity Tolerance: Patient limited by fatigue;Patient limited by lethargy Patient left: in bed;with bed alarm set;with call bell/phone within reach;with family/visitor present Nurse Communication: Mobility status;Other (comment)       Time:  -     Charges:                       G Codes:       Chesley Noon, PTA 08/01/16, 12:09 PM

## 2016-08-01 NOTE — Progress Notes (Signed)
[facility] [place] NAME: Sherry [name]    MR#:  [id number]  DATE OF BIRTH:  November 23, 1927  SUBJECTIVE:  CHIEF COMPLAINT:   Chief Complaint  Patient presents with  . Abdominal Pain  . Constipation   The patient is a 83 year old Caucasian female with past medical history significant with history of diabetes, hypertension, hyperlipidemia, pulmonary embolism, who presents to the hospital with abdominal discomfort, constipation. 2 weeks. CT of the abdomen and pelvis revealed fecal impaction, significant stool burden, disimpaction was performed. In emergency room, but patient continued to have nausea and intermittent vomiting, she vomited feculent-appearing material On arrival to the floor. She denies abdominal pain , Less nausea, had a bowel movement earlier today, medium amount. Review of Systems  Constitutional: Negative for chills, fever and weight loss.  HENT: Negative for congestion.   Eyes: Negative for blurred vision and double vision.  Respiratory: Negative for cough, sputum production, shortness of breath and wheezing.   Cardiovascular: Negative for chest pain, palpitations, orthopnea, leg swelling and PND.  Gastrointestinal: Positive for nausea. Negative for abdominal pain, blood in stool, constipation, diarrhea and vomiting.  Genitourinary: Negative for dysuria, frequency, hematuria and urgency.  Musculoskeletal: Negative for falls.  Neurological: Negative for dizziness, tremors, focal weakness and headaches.  Endo/Heme/Allergies: Does not bruise/bleed easily.  Psychiatric/Behavioral: Negative for depression. The patient does not have insomnia.     VITAL SIGNS: Blood pressure (!) 138/48, pulse 72, temperature 98.8 F (37.1 C), temperature source Oral, resp. rate 18, height 5\' 4"  (1.626 m), weight 56.2 kg (123 lb 14.4 oz), SpO2 99 %.  PHYSICAL EXAMINATION:   GENERAL:  83 y.o.-year-old patient lying in the bed in no acute  distress, however, uncomfortable and somewhat sad appearing.  EYES: Pupils equal, round, reactive to light and accommodation. No scleral icterus. Extraocular muscles intact.  HEENT: Head atraumatic, normocephalic. Oropharynx and nasopharynx clear.  NECK:  Supple, no jugular venous distention. No thyroid enlargement, no tenderness.  LUNGS: Normal breath sounds bilaterally, no wheezing, rales,rhonchi or crepitation. No use of accessory muscles of respiration.  CARDIOVASCULAR: S1, S2 normal. No murmurs, rubs, or gallops.  ABDOMEN: Soft, nontender, nondistended. Bowel sounds present. No organomegaly or mass.  EXTREMITIES: No pedal edema, cyanosis, or clubbing.  NEUROLOGIC: Cranial nerves II through XII are intact. Muscle strength 5/5 in all extremities. Sensation intact. Gait not checked.  PSYCHIATRIC: The patient is alert and oriented x 3.  SKIN: No obvious rash, lesion, or ulcer.   ORDERS/RESULTS REVIEWED:   CBC  Recent Labs Lab 07/28/16 1515 07/29/16 0405  WBC 4.4 8.2  HGB 12.2 12.3  HCT 36.9 36.2  PLT 306 289  MCV 82.2 81.6  MCH 27.0 27.6  MCHC 32.9 33.9  RDW 14.9* 14.4   ------------------------------------------------------------------------------------------------------------------  Chemistries   Recent Labs Lab 07/28/16 1515 07/29/16 0405 07/30/16 0326 08/01/16 0528  NA 135 139  --  137  K 3.3* 3.3* 3.8 3.8  CL 103 98*  --  104  CO2 26 32  --  27  GLUCOSE 205* 161*  --  171*  BUN 16 14  --  8  CREATININE 1.01* 0.98  --  0.84  CALCIUM 8.0* 8.4*  --  7.9*  MG  --  1.9 1.7  --   AST 21  --   --   --   ALT 9*  --   --   --   ALKPHOS 79  --   --   --  BILITOT 0.6  --   --   --    ------------------------------------------------------------------------------------------------------------------ estimated creatinine clearance is 41.5 mL/min (by C-G formula based on SCr of 0.84  mg/dL). ------------------------------------------------------------------------------------------------------------------ No results for input(s): TSH, T4TOTAL, T3FREE, THYROIDAB in the last 72 hours.  Invalid input(s): FREET3  Cardiac Enzymes  Recent Labs Lab 07/28/16 1515  TROPONINI <0.03   ------------------------------------------------------------------------------------------------------------------ Invalid input(s): POCBNP ---------------------------------------------------------------------------------------------------------------  RADIOLOGY: Dg Abd 1 View  Result Date: 08/01/2016 CLINICAL DATA:  Ileus EXAM: ABDOMEN - 1 VIEW COMPARISON:  KUB dated July 29, 2016 FINDINGS: The colonic stool burden is moderate as is the rectal stool burden. There small amounts of gas within minimally distended loops of small bowel. No free extraluminal gas is observed. There degenerative changes of the lumbar spine and both hips. There is partial compression of the body of L3 which is stable. IMPRESSION: Moderately increased colonic stool burden may reflect constipation. A fecal impaction is not excluded. Minimal gaseous distention of a few loops of small bowel may reflect ileus Electronically Signed   By: David  Martinique M.D.   On: 08/01/2016 08:00   US Abdomen Complete  Result Date: 07/31/2016 CLINICAL DATA:  Nausea for 4 days. EXAM: ABDOMEN ULTRASOUND COMPLETE COMPARISON:  CT of the abdomen and pelvis 07/28/2016 FINDINGS: Gallbladder: Gallbladder wall is normal thickness, 2 mm. No sonographic Murphy's sign. Gallbladder sludge is present. No stones or pericholecystic fluid. Common bile duct: Diameter: 5.0 mm Liver: No focal lesion identified. Within normal limits in parenchymal echogenicity. IVC: No abnormality visualized. Pancreas: Visualized portion unremarkable. Spleen: Size and appearance within normal limits. Right Kidney: Length: 8.3 cm. Normal echogenicity. No mass. Mild dilatation of upper and  midpole calices and right renal pelvis. Left Kidney: Length: 9.3 cm. Echogenicity within normal limits. No mass or hydronephrosis visualized. Abdominal aorta: No aneurysm visualized. Other findings: Trace amount of ascites in the left upper quadrant. IMPRESSION: 1. No evidence for acute cholecystitis. 2. Stable mild dilatation of right renal calices and renal pelvis. No renal lesions identified. 3. Trace amount of ascites in the left upper quadrant. Electronically Signed   By: Nolon Nations M.D.   On: 07/31/2016 08:12    EKG:  Orders placed or performed in visit on 07/28/16  . EKG 12-Lead  . EKG 12-Lead  . EKG 12-Lead    ASSESSMENT AND PLAN:  Principal Problem:   Fecal impaction of colon ([place]) Active Problems:   Diabetes mellitus type 2, insulin dependent ([place])   Chronic kidney disease (CKD), stage III (moderate)   Essential hypertension   HLD (hyperlipidemia)   Fecal impaction ([facility])   Chronic idiopathic constipation  #1. Fecal impaction, partial pseudoobstruction with resultant nausea and vomiting, KUB showed possible fecal impaction and ileus, continue patient on Reglan, Senokot, Colace, MiraLAX, soap suds enemas, the patient was seen by Dr. Vicente Males of gastroenterology , recommended to continue daily MiraLAX, PPI, advance diet as tolerated, follow clinically, possibly discharge tomorrow #2. Hypokalemia, supplemented intravenously, resolved, magnesium level was low normal at 1.7, given oral supplementation  #3 acute renal insufficiency, resolved, urinalysis is not taken, despite numerous repeated orders written. Get urinary culture if abnormal appearance #4. Diabetes mellitus type 2 continue sliding scale insulin,, no hypoglycemia today, watch closely with poor oral intake #5. Generalized weakness, patient was evaluated by physical therapist and recommended skilled nursing facility placement for rehabilitation, possible discharge tomorrow if able to advance diet   Management plans  discussed with the patient, family and they are in agreement.   DRUG ALLERGIES: No Known  Allergies  CODE STATUS:     Code Status Orders        Start     Ordered   07/28/16 2106  Full code  Continuous     07/28/16 2106    Code Status History    Date Active Date Inactive Code Status Order ID Comments User Context   07/22/2016  4:13 AM 07/24/2016  5:43 PM Full Code [id number]  Harrie Foreman, MD Inpatient   07/12/2016 10:45 PM 07/18/2016  8:32 PM Full Code [id number]  Hugelmeyer, Alexis, DO Inpatient   05/28/2016 12:01 PM 06/02/2016  7:32 PM Full Code [id number]  Lorella Nimrod, MD Inpatient   04/24/2016  7:27 PM 04/27/2016  8:08 PM Full Code [id number]  Loletha Grayer, MD ED   04/19/2016 12:50 AM 04/22/2016 12:10 AM Full Code [id number]  Lance Coon, MD Inpatient   03/27/2016  9:39 PM 03/29/2016  9:41 PM Full Code [id number]  Fritzi Mandes, MD ED   03/12/2016 12:21 PM 03/13/2016  8:32 PM Full Code [id number]  Dustin Flock, MD Inpatient   03/09/2016  4:33 PM 03/12/2016 12:21 PM DNR [id number]  Nicholes Mango, MD ED   02/26/2016  9:39 PM 03/01/2016 10:37 PM DNR [id number]  Karmen Bongo, MD Inpatient   02/26/2016  7:48 PM 02/26/2016  9:39 PM DNR [id number]  Dorie Rank, MD ED   01/31/2016  5:45 PM 02/07/2016 11:45 PM DNR [id number]  Flora Lipps, MD Inpatient   01/30/2016 11:19 PM 01/31/2016  5:44 PM Full Code [id number]  [place], Ubaldo Glassing, DO Inpatient   11/26/2014  3:52 PM 11/28/2014  3:07 PM Full Code [id number]  Max Sane, MD Inpatient   11/26/2014  8:59 AM 11/26/2014  3:52 PM Full Code [id number]  Max Sane, MD ED    Advance Directive Documentation     Most Recent Value  Type of Advance Directive  Healthcare Power of Attorney, Living will  Pre-existing out of facility DNR order (yellow form or pink MOST form)  -  "MOST" Form in Place?  -      TOTAL TIME TAKING CARE OF THIS PATIENT: 30 minutes.    Theodoro Grist M.D on 08/01/2016 at 3:20 PM  Between 7am to 6pm - Pager - [phone]  After 6pm go to  [email or web address] - password [email or web address] [facility]  [facility]  Office  [phone]  CC: Primary care physician; [facility]

## 2016-08-02 DIAGNOSIS — K5641 Fecal impaction: Secondary | ICD-10-CM | POA: Diagnosis not present

## 2016-08-02 LAB — GLUCOSE, CAPILLARY
Glucose-Capillary: 114 mg/dL — ABNORMAL HIGH (ref 65–99)
Glucose-Capillary: 186 mg/dL — ABNORMAL HIGH (ref 65–99)

## 2016-08-02 MED ORDER — POLYETHYLENE GLYCOL 3350 17 G PO PACK: 17.0000 g | PACK | Freq: Every day | ORAL | 0 refills | Status: DC

## 2016-08-02 MED ORDER — AMLODIPINE BESYLATE 5 MG PO TABS
2.5000 mg | ORAL_TABLET | Freq: Every day | ORAL | Status: DC
Start: 2016-08-03 — End: 2016-08-02

## 2016-08-02 MED ORDER — DOCUSATE SODIUM 100 MG PO CAPS: 100.0000 mg | ORAL_CAPSULE | Freq: Two times a day (BID) | ORAL | 0 refills | Status: DC

## 2016-08-02 MED ORDER — MEGESTROL ACETATE 40 MG PO TABS: 40.0000 mg | ORAL_TABLET | Freq: Every day | ORAL | 2 refills | Status: DC

## 2016-08-02 NOTE — Progress Notes (Signed)
Patient discharge teaching given, including activity, diet, follow-up appoints, and medications. Patient verbalized understanding of all discharge instructions. IV access was d/c'd. Vitals are stable. Skin is intact except as charted in most recent assessments. Pt to be escorted out by NT, to be driven home by family.  Khristin Keleher  

## 2016-08-02 NOTE — Care Management (Signed)
Patient to discharge home today with resumption of home health services and home palliative services.  Christie from [facility], and Santiago Glad from [facility] and [facility] [place] notified of discharge.  RNCM signing off

## 2016-08-02 NOTE — Progress Notes (Signed)
SLP Cancellation Note  Patient Details Name: Sherry [name] MRN: [id number] DOB: 1927/04/12   Cancelled treatment:        SLP screened and pt presented with no overt ssx aspiration. SLP consulted NSG and stated pt has had no difficulty with meals, however coughs with medications crushed in applesauce. Pt states it is due to bitter taste. Pt also states she takes meds crushed with applesauce at home with no difficulty. SLP educated pt to notify home health nurse or MD immediately if problems persist at home. No indication for evaluation as pt is being discharged at the time of the screening.                                                       [place] Sauber 08/02/2016, 4:01 PM

## 2016-08-02 NOTE — Care Management (Addendum)
Patient admitted with fecal impaction.  Patient lives at home with family.  Family provides 24/7 care.  Previous admission family was provided with information on med alert and PCS services.  [facility]P [facility].  Patient has hospital bed, WC, walker, and BSC in the home.  Patient open with [facility] home [facility] for RN, PT, OT, aide and SW. Adonis Brook with [facility] notified of admission.  PT has assessed patient and recommends SNF, however patient's family wishes for her to return home.  RNCM following for discharge planning.

## 2016-08-03 NOTE — Discharge Summary (Signed)
[facility] [place] [facility] NAME: Sherry [name]    MR#:  [id number]  DATE OF BIRTH:  04-18-1927  DATE OF ADMISSION:  07/28/2016   ADMITTING PHYSICIAN: Lance Coon, MD  DATE OF DISCHARGE: 08/02/2016  4:53 PM  PRIMARY CARE PHYSICIAN: [facility]   ADMISSION DIAGNOSIS:   Fecal impaction ([facility]) [K56.41] Abdominal pain, unspecified abdominal location [R10.9] Nausea and vomiting, intractability of vomiting not specified, unspecified vomiting type [R11.2]  DISCHARGE DIAGNOSIS:   Principal Problem:   Fecal impaction of colon ([place]) Active Problems:   Diabetes mellitus type 2, insulin dependent ([place])   Chronic kidney disease (CKD), stage III (moderate)   Essential hypertension   HLD (hyperlipidemia)   Fecal impaction ([facility])   Chronic idiopathic constipation   SECONDARY DIAGNOSIS:   Past Medical History:  Diagnosis Date  . Diabetes mellitus without complication ([place])   . Hyperlipidemia   . Hypertension   . Pulmonary embolism [facility])     HOSPITAL COURSE:   83 year old female with past medical history significant for diabetes mellitus, hypertension, pulmonary embolism, chronic low back pain from back surgery admitted to the hospital secondary to abdominal discomfort and constipation.  #1 constipation-causing abdominal pain and nausea with poor oral intake. -CT of the abdomen showing large amount of stool throughout the colon and possible impaction -No evidence of obstruction or perforation was noted. -Symptoms resolved with enemas and multiple laxatives. -Appetite is still poor, but no further nausea or vomiting. She is able to go home -Stool softeners added at discharge. Also low-dose Megace added.  #2 chronic atrial fibrillation-on amiodarone, changed to lower dose. Also on eliquis for anticoagulation. -And tinea metoprolol for rate control.  #3 hypertension-unless medically, metoprolol and Norvasc.  #4 GERD-on Protonix  and sucralfate  Worked with physical therapy, recommended rehabilitation. However seems to be at baseline and has 24/7 caregivers at home. So, family intended to take her home.  DISCHARGE CONDITIONS:   Guarded  CONSULTS OBTAINED:   Treatment Team:  Thornton Park, MD  DRUG ALLERGIES:   No Known Allergies DISCHARGE MEDICATIONS:   Allergies as of 08/02/2016   No Known Allergies     Medication List    STOP taking these medications   diphenhydrAMINE 25 MG tablet Commonly known as:  BENADRYL   Insulin Detemir 100 UNIT/ML Pen Commonly known as:  LEVEMIR   TRESIBA FLEXTOUCH 100 UNIT/ML Sopn FlexTouch Pen Generic drug:  insulin degludec     TAKE these medications   amiodarone 200 MG tablet Commonly known as:  PACERONE Take 1 tablet (200 mg total) by mouth 2 (two) times daily. Change to q daily in 2 weeks   amLODipine 5 MG tablet Commonly known as:  NORVASC Take 5 mg by mouth daily.   apixaban 2.5 MG Tabs tablet Commonly known as:  ELIQUIS Take 1 tablet (2.5 mg total) by mouth 2 (two) times daily.   atorvastatin 20 MG tablet Commonly known as:  LIPITOR Take 20 mg by mouth at bedtime.   docusate sodium 100 MG capsule Commonly known as:  COLACE Take 1 capsule (100 mg total) by mouth 2 (two) times daily.   ferrous sulfate 325 (65 FE) MG tablet Take 1 tablet (325 mg total) by mouth daily with breakfast.   gabapentin 300 MG capsule Commonly known as:  NEURONTIN Take 300 mg by mouth at bedtime.   lisinopril 20 MG tablet Commonly known as:  PRINIVIL,ZESTRIL Take 1 tablet (20 mg total) by mouth daily.  magnesium oxide 400 (241.3 Mg) MG tablet Commonly known as:  MAG-OX Take 1 tablet (400 mg total) by mouth daily.   megestrol 40 MG tablet Commonly known as:  MEGACE Take 1 tablet (40 mg total) by mouth daily.   meloxicam 7.5 MG tablet Commonly known as:  MOBIC Take 7.5 mg by mouth daily.   metoCLOPramide 5 MG tablet Commonly known as:  REGLAN Take 1  tablet (5 mg total) by mouth 2 (two) times daily.   metoprolol tartrate 25 MG tablet Commonly known as:  LOPRESSOR Take 1 tablet (25 mg total) by mouth 2 (two) times daily.   multivitamin with minerals Tabs tablet Take 1 tablet by mouth daily.   ondansetron 4 MG disintegrating tablet Commonly known as:  ZOFRAN-ODT Take 4 mg by mouth every 6 (six) hours as needed for nausea or vomiting.   pantoprazole 40 MG tablet Commonly known as:  PROTONIX Take 1 tablet (40 mg total) by mouth 2 (two) times daily. What changed:  when to take this   polyethylene glycol packet Commonly known as:  MIRALAX Take 17 g by mouth daily. What changed:  when to take this  reasons to take this   sertraline 25 MG tablet Commonly known as:  ZOLOFT Take 1 tablet (25 mg total) by mouth daily.   sucralfate 1 GM/10ML suspension Commonly known as:  CARAFATE Take 10 mLs (1 g total) by mouth 4 (four) times daily -  with meals and at bedtime.   tamsulosin 0.4 MG Caps capsule Commonly known as:  FLOMAX Take 1 capsule (0.4 mg total) by mouth daily. What changed:  when to take this        DISCHARGE INSTRUCTIONS:   1. PCP follow-up in 1-2 weeks  DIET:   Cardiac diet  ACTIVITY:   Activity as tolerated  OXYGEN:   Home Oxygen: No.  Oxygen Delivery: room air  DISCHARGE LOCATION:   home   If you experience worsening of your admission symptoms, develop shortness of breath, life threatening emergency, suicidal or homicidal thoughts you must seek medical attention immediately by calling 911 or calling your MD immediately  if symptoms less severe.  You Must read complete instructions/literature along with all the possible adverse reactions/side effects for all the Medicines you take and that have been prescribed to you. Take any new Medicines after you have completely understood and accpet all the possible adverse reactions/side effects.   Please note  You were cared for by a hospitalist during  your hospital stay. If you have any questions about your discharge medications or the care you received while you were in the hospital after you are discharged, you can call the unit and asked to speak with the hospitalist on call if the hospitalist that took care of you is not available. Once you are discharged, your primary care physician will handle any further medical issues. Please note that NO REFILLS for any discharge medications will be authorized once you are discharged, as it is imperative that you return to your primary care physician (or establish a relationship with a primary care physician if you do not have one) for your aftercare needs so that they can reassess your need for medications and monitor your lab values.    On the day of Discharge:  VITAL SIGNS:   Blood pressure (!) 150/46, pulse 72, temperature 98.5 F (36.9 C), temperature source Oral, resp. rate 20, height 5\' 4"  (1.626 m), weight 56.2 kg (123 lb 14.4 oz), SpO2 98 %.  PHYSICAL EXAMINATION:    GENERAL:  83 y.o.-year-old elderly patient lying in the bed with no acute distress.  EYES: Pupils equal, round, reactive to light and accommodation. No scleral icterus. Extraocular muscles intact.  HEENT: Head atraumatic, normocephalic. Oropharynx and nasopharynx clear.  NECK:  Supple, no jugular venous distention. No thyroid enlargement, no tenderness.  LUNGS: Normal breath sounds bilaterally, no wheezing, rales,rhonchi or crepitation. No use of accessory muscles of respiration. Decreased bibasilar breath sounds CARDIOVASCULAR: S1, S2 normal. No  rubs, or gallops. 2/6 systolic murmur present. ABDOMEN: Soft, non-tender, non-distended. Bowel sounds present. No organomegaly or mass.  EXTREMITIES: No pedal edema, cyanosis, or clubbing.  NEUROLOGIC: Cranial nerves II through XII are intact. Muscle strength 5/5 in all extremities. Sensation intact. Gait not checked.  PSYCHIATRIC: The patient is alert and oriented x 3.  SKIN: No  obvious rash, lesion, or ulcer.   DATA REVIEW:   CBC  Recent Labs Lab 07/29/16 0405  WBC 8.2  HGB 12.3  HCT 36.2  PLT 289    Chemistries   Recent Labs Lab 07/28/16 1515  07/30/16 0326 08/01/16 0528  NA 135  < >  --  137  K 3.3*  < > 3.8 3.8  CL 103  < >  --  104  CO2 26  < >  --  27  GLUCOSE 205*  < >  --  171*  BUN 16  < >  --  8  CREATININE 1.01*  < >  --  0.84  CALCIUM 8.0*  < >  --  7.9*  MG  --   < > 1.7  --   AST 21  --   --   --   ALT 9*  --   --   --   ALKPHOS 79  --   --   --   BILITOT 0.6  --   --   --   < > = values in this interval not displayed.   Microbiology Results  Results for orders placed or performed during the hospital encounter of 05/28/16  Culture, Urine     Status: Abnormal   Collection Time: 05/29/16 11:52 PM  Result Value Ref Range Status   Specimen Description URINE, RANDOM  Final   Special Requests NONE  Final   Culture MULTIPLE SPECIES PRESENT, SUGGEST RECOLLECTION (A)  Final   Report Status 05/31/2016 FINAL  Final    RADIOLOGY:  No results found.   Management plans discussed with the patient, family and they are in agreement.  CODE STATUS:  Code Status History    Date Active Date Inactive Code Status Order ID Comments User Context   07/28/2016  9:06 PM 08/02/2016  8:04 PM Full Code [id number]  Lance Coon, MD Inpatient   07/22/2016  4:13 AM 07/24/2016  5:43 PM Full Code [id number]  Harrie Foreman, MD Inpatient   07/12/2016 10:45 PM 07/18/2016  8:32 PM Full Code [id number]  Hugelmeyer, Ubaldo Glassing, DO Inpatient   05/28/2016 12:01 PM 06/02/2016  7:32 PM Full Code [id number]  Lorella Nimrod, MD Inpatient   04/24/2016  7:27 PM 04/27/2016  8:08 PM Full Code [id number]  Loletha Grayer, MD ED   04/19/2016 12:50 AM 04/22/2016 12:10 AM Full Code [id number]  Lance Coon, MD Inpatient   03/27/2016  9:39 PM 03/29/2016  9:41 PM Full Code [id number]  Fritzi Mandes, MD ED   03/12/2016 12:21 PM 03/13/2016  8:32 PM Full Code [id number]  Dustin Flock, MD  Inpatient   03/09/2016  4:33 PM 03/12/2016 12:21 PM DNR [id number]  Nicholes Mango, MD ED   02/26/2016  9:39 PM 03/01/2016 10:37 PM DNR [id number]  Karmen Bongo, MD Inpatient   02/26/2016  7:48 PM 02/26/2016  9:39 PM DNR [id number]  Dorie Rank, MD ED   01/31/2016  5:45 PM 02/07/2016 11:45 PM DNR [id number]  Flora Lipps, MD Inpatient   01/30/2016 11:19 PM 01/31/2016  5:44 PM Full Code [id number]  Hugelmeyer, Ubaldo Glassing, DO Inpatient   11/26/2014  3:52 PM 11/28/2014  3:07 PM Full Code [id number]  Max Sane, MD Inpatient   11/26/2014  8:59 AM 11/26/2014  3:52 PM Full Code [id number]  Max Sane, MD ED    Advance Directive Documentation     Most Recent Value  Type of Advance Directive  Healthcare Power of Attorney, Living will  Pre-existing out of facility DNR order (yellow form or pink MOST form)  -  "MOST" Form in Place?  -      TOTAL TIME TAKING CARE OF THIS PATIENT: 37 minutes.    Kamariya Blevens M.D on 08/03/2016 at 3:16 PM  Between 7am to 6pm - Pager - [phone]  After 6pm go to [email or web address] - Technical brewer  Hospitalists  Office  [phone]  CC: Primary care physician; [facility]   Note: This dictation was prepared with Dragon dictation along with smaller phrase technology. Any transcriptional errors that result from this process are unintentional.

## 2016-08-07 ENCOUNTER — Encounter
Admission: RE | Admit: 2016-08-07 | Discharge: 2016-08-07 | Disposition: A | Discharge status: Home / Self Care | Payer: Medicare Other | Source: Ambulatory Visit | Attending: Urology | Admitting: Urology

## 2016-08-07 DIAGNOSIS — Z01812 Encounter for preprocedural laboratory examination: Secondary | ICD-10-CM | POA: Diagnosis not present

## 2016-08-07 DIAGNOSIS — N2889 Other specified disorders of kidney and ureter: Secondary | ICD-10-CM | POA: Diagnosis not present

## 2016-08-07 HISTORY — DX: Anxiety disorder, unspecified: F41.9

## 2016-08-07 HISTORY — DX: Dyspnea, unspecified: R06.00

## 2016-08-07 HISTORY — DX: Gastro-esophageal reflux disease without esophagitis: K21.9

## 2016-08-07 HISTORY — DX: Dizziness and giddiness: R42

## 2016-08-07 HISTORY — DX: Unspecified osteoarthritis, unspecified site: M19.90

## 2016-08-07 HISTORY — DX: Chronic kidney disease, unspecified: N18.9

## 2016-08-07 NOTE — Pre-Procedure Instructions (Signed)
Unable to obtain urine culture specimen at pre-op visit , Amy at Dr. Pilar Jarvis office notified.

## 2016-08-07 NOTE — Pre-Procedure Instructions (Signed)
Dr. Rosey Bath made aware of previous LBBB and stated "should be okay since it is not new.",  and clearance is on chart from Dr. Nils Pyle.

## 2016-08-07 NOTE — Patient Instructions (Addendum)
Your procedure is scheduled on: August 11, 2016 (FRIDAY ) Report to Same Day Surgery 2nd floor medical mall ([place] [place] Entrance-take elevator on left to 2nd floor.  Check in with surgery information desk.) To find out your arrival time please call [phone] between 1PM - 3PM on August 10, 2016 (THURSDAY ) Remember: Instructions that are not followed completely may result in serious medical risk, up to and including death, or upon the discretion of your surgeon and anesthesiologist your surgery may need to be rescheduled.    _x___ 1. Do not eat food or drink liquids after midnight. No gum chewing or hard candies                                 __x__ 2. No Alcohol for 24 hours before or after surgery.   __x__3. No Smoking for 24 prior to surgery.   ____  4. Bring all medications with you on the day of surgery if instructed.    __x__ 5. Notify your doctor if there is any change in your medical condition     (cold, fever, infections).     Do not wear jewelry, make-up, hairpins, clips or nail polish.  Do not wear lotions, powders, or perfumes. You may wear deodorant.  Do not shave 48 hours prior to surgery. Men may shave face and neck.  Do not bring valuables to the hospital.    [facility] is not responsible for any belongings or valuables.               Contacts, dentures or bridgework may not be worn into surgery.  Leave your suitcase in the car. After surgery it may be brought to your room.  For patients admitted to the hospital, discharge time is determined by your treatment team                      Patients discharged the day of surgery will not be allowed to drive home.  You will need someone to drive you home and stay with you the night of your procedure.    Please read over the following fact sheets that you were given:   [facility] Preparing for Surgery and or MRSA Information   _x___ Take the following medications the morning of surgery with a sip of water :   1.  AMLODIPINE  2. AMIODARONE  3  LISINOPRIL  4. MAGNESIUM OXIDE  5. METOPROLOL  6. PANTOPRAZOLE  (PANTOPRAZOLE AT BEDTIME ON THURSDAY NIGHT AT BEDTIME  )  7. SERTRALINE ____Fleets enema or Magnesium Citrate as directed.   ____ Use CHG Soap or sage wipes as directed on instruction sheet   ____ Use inhalers on the day of surgery and bring to hospital day of surgery  ____ Stop Metformin and Janumet 2 days prior to surgery.    ____ Take 1/2 of usual insulin dose the night before surgery and none on the morning surgery (DO NOT TAKE INSULIN ON FRIDAY   MORNING     )  _x___ Follow recommendations from Cardiologist, Pulmonologist or PCP regarding          stopping Aspirin, Coumadin, Plavix ,Eliquis, Effient, or Pradaxa, and Pletal. (STOP ELIQUIS (APIXABAN ) ON AUGUST 1 )  X____Stop Anti-inflammatories such as Advil, Aleve, Ibuprofen, Motrin, Naproxen, Naprosyn, Goodies powders or aspirin products. OK to take Tylenol  (STOP MELOXICAM NOW )   _x___ Stop supplements  until after surgery.  But may continue Vitamin D, Vitamin B,and multivitamin    ____ Bring C-Pap to the hospital.   PATIENT DOES NOT KNOW TYPE , NAME OR DOSAGE OF INSULIN AND DID NOT BRING TO PRE-OP VISIT . INSTRUCTED TO BRING INSULIN ON THE DAY OF SURGERY BUT DO NOT TAKE MORNING DOSAGE

## 2016-08-07 NOTE — Telephone Encounter (Signed)
Called to notify pt that she missed her appt with Dr Nehemiah Massed for surgical clearance & that an appt has been made at our office for a urine culture prior to surgery on 08/08/16 @1 :00. Advised pt to call Dr Alveria Apley office asap to reschedule appt. Pt said "OK" & immediately hung up the phone without a chance to confirm she understands.

## 2016-08-08 ENCOUNTER — Ambulatory Visit: Payer: Medicare Other

## 2016-08-08 ENCOUNTER — Encounter: Payer: Self-pay | Admitting: Urology

## 2016-08-08 ENCOUNTER — Other Ambulatory Visit: Payer: Self-pay | Admitting: Radiology

## 2016-08-08 DIAGNOSIS — N2889 Other specified disorders of kidney and ureter: Secondary | ICD-10-CM

## 2016-08-08 NOTE — Telephone Encounter (Addendum)
Pt missed appt for urine culture. Attempted to reach pt & daughter, [name], without success. LMOM on daughter's phone, pt's vm has not been set up. Need to reschedule surgery until after pt sees cardiologist & has recent urine culture.

## 2016-08-10 NOTE — Telephone Encounter (Signed)
LMOM

## 2016-08-14 NOTE — Telephone Encounter (Signed)
Called & spoke with daughter, [name], regarding if they would like to proceed with surgery. [name] states pt's son hasn't followed through on appointments but that she will be taking over care of pt going forward. Made [name] aware of surgery rescheduled to 08/30/16 & need for pt to have surgical clearance appt with Dr Nehemiah Massed & also to RTC for ucx prior to surgery. [name] called back stating pt has appt with Dr Nehemiah Massed scheduled 08/17/16 @1 :46.

## 2016-08-21 ENCOUNTER — Encounter: Payer: Self-pay | Admitting: Emergency Medicine

## 2016-08-21 ENCOUNTER — Other Ambulatory Visit: Payer: Self-pay

## 2016-08-21 ENCOUNTER — Emergency Department: Payer: Medicare Other

## 2016-08-21 ENCOUNTER — Observation Stay
Admission: EM | Admit: 2016-08-21 | Discharge: 2016-08-22 | Disposition: A | Discharge status: Home / Self Care | Payer: Medicare Other | Attending: Internal Medicine | Admitting: Internal Medicine

## 2016-08-21 DIAGNOSIS — Z86711 Personal history of pulmonary embolism: Secondary | ICD-10-CM | POA: Diagnosis not present

## 2016-08-21 DIAGNOSIS — M1990 Unspecified osteoarthritis, unspecified site: Secondary | ICD-10-CM | POA: Diagnosis not present

## 2016-08-21 DIAGNOSIS — I129 Hypertensive chronic kidney disease with stage 1 through stage 4 chronic kidney disease, or unspecified chronic kidney disease: Secondary | ICD-10-CM | POA: Insufficient documentation

## 2016-08-21 DIAGNOSIS — N390 Urinary tract infection, site not specified: Secondary | ICD-10-CM | POA: Diagnosis not present

## 2016-08-21 DIAGNOSIS — F419 Anxiety disorder, unspecified: Secondary | ICD-10-CM | POA: Insufficient documentation

## 2016-08-21 DIAGNOSIS — N189 Chronic kidney disease, unspecified: Secondary | ICD-10-CM | POA: Diagnosis not present

## 2016-08-21 DIAGNOSIS — I10 Essential (primary) hypertension: Secondary | ICD-10-CM | POA: Diagnosis present

## 2016-08-21 DIAGNOSIS — Z794 Long term (current) use of insulin: Secondary | ICD-10-CM | POA: Diagnosis not present

## 2016-08-21 DIAGNOSIS — E162 Hypoglycemia, unspecified: Secondary | ICD-10-CM

## 2016-08-21 DIAGNOSIS — A419 Sepsis, unspecified organism: Principal | ICD-10-CM | POA: Insufficient documentation

## 2016-08-21 DIAGNOSIS — E11649 Type 2 diabetes mellitus with hypoglycemia without coma: Secondary | ICD-10-CM | POA: Diagnosis not present

## 2016-08-21 DIAGNOSIS — K219 Gastro-esophageal reflux disease without esophagitis: Secondary | ICD-10-CM | POA: Diagnosis not present

## 2016-08-21 DIAGNOSIS — R531 Weakness: Secondary | ICD-10-CM

## 2016-08-21 DIAGNOSIS — Z79899 Other long term (current) drug therapy: Secondary | ICD-10-CM | POA: Diagnosis not present

## 2016-08-21 DIAGNOSIS — E876 Hypokalemia: Secondary | ICD-10-CM | POA: Diagnosis not present

## 2016-08-21 DIAGNOSIS — E119 Type 2 diabetes mellitus without complications: Secondary | ICD-10-CM

## 2016-08-21 DIAGNOSIS — E1122 Type 2 diabetes mellitus with diabetic chronic kidney disease: Secondary | ICD-10-CM | POA: Diagnosis not present

## 2016-08-21 DIAGNOSIS — E785 Hyperlipidemia, unspecified: Secondary | ICD-10-CM | POA: Insufficient documentation

## 2016-08-21 LAB — CBC WITH DIFFERENTIAL/PLATELET
BASOS ABS: 0 10*3/uL (ref 0–0.1)
Basophils Relative: 0 %
EOS PCT: 0 %
Eosinophils Absolute: 0 10*3/uL (ref 0–0.7)
HCT: 37.7 % (ref 35.0–47.0)
Hemoglobin: 12.3 g/dL (ref 12.0–16.0)
LYMPHS PCT: 7 %
Lymphs Abs: 0.8 10*3/uL — ABNORMAL LOW (ref 1.0–3.6)
MCH: 27.1 pg (ref 26.0–34.0)
MCHC: 32.7 g/dL (ref 32.0–36.0)
MCV: 82.9 fL (ref 80.0–100.0)
Monocytes Absolute: 0.3 10*3/uL (ref 0.2–0.9)
Monocytes Relative: 3 %
Neutro Abs: 11.1 10*3/uL — ABNORMAL HIGH (ref 1.4–6.5)
Neutrophils Relative %: 90 %
PLATELETS: 293 10*3/uL (ref 150–440)
RBC: 4.55 MIL/uL (ref 3.80–5.20)
RDW: 16.3 % — ABNORMAL HIGH (ref 11.5–14.5)
WBC: 12.3 10*3/uL — AB (ref 3.6–11.0)

## 2016-08-21 LAB — URINALYSIS, COMPLETE (UACMP) WITH MICROSCOPIC
Bilirubin Urine: NEGATIVE
GLUCOSE, UA: 150 mg/dL — AB
KETONES UR: NEGATIVE mg/dL
NITRITE: NEGATIVE
PROTEIN: 100 mg/dL — AB
Specific Gravity, Urine: 1.016 (ref 1.005–1.030)
Squamous Epithelial / LPF: NONE SEEN
pH: 7 (ref 5.0–8.0)

## 2016-08-21 LAB — GLUCOSE, CAPILLARY
GLUCOSE-CAPILLARY: 176 mg/dL — AB (ref 65–99)
GLUCOSE-CAPILLARY: 109 mg/dL — AB (ref 65–99)
Glucose-Capillary: 54 mg/dL — ABNORMAL LOW (ref 65–99)
Glucose-Capillary: 174 mg/dL — ABNORMAL HIGH (ref 65–99)
Glucose-Capillary: 63 mg/dL — ABNORMAL LOW (ref 65–99)
Glucose-Capillary: 82 mg/dL (ref 65–99)

## 2016-08-21 LAB — COMPREHENSIVE METABOLIC PANEL
ALT: 16 U/L (ref 14–54)
AST: 32 U/L (ref 15–41)
Albumin: 2.7 g/dL — ABNORMAL LOW (ref 3.5–5.0)
Alkaline Phosphatase: 88 U/L (ref 38–126)
Anion gap: 12 (ref 5–15)
BUN: 18 mg/dL (ref 6–20)
CHLORIDE: 111 mmol/L (ref 101–111)
CO2: 20 mmol/L — AB (ref 22–32)
CREATININE: 0.8 mg/dL (ref 0.44–1.00)
Calcium: 8.5 mg/dL — ABNORMAL LOW (ref 8.9–10.3)
GFR calc Af Amer: >60 mL/min (ref >60)
GLUCOSE: 81 mg/dL (ref 65–99)
Potassium: 3 mmol/L — ABNORMAL LOW (ref 3.5–5.1)
SODIUM: 143 mmol/L (ref 135–145)
Total Bilirubin: 0.8 mg/dL (ref 0.3–1.2)
Total Protein: 6 g/dL — ABNORMAL LOW (ref 6.5–8.1)

## 2016-08-21 LAB — BRAIN NATRIURETIC PEPTIDE: B Natriuretic Peptide: 94 pg/mL (ref 0.0–100.0)

## 2016-08-21 LAB — LIPASE, BLOOD: Lipase: 17 U/L (ref 11–51)

## 2016-08-21 LAB — BASIC METABOLIC PANEL
Anion gap: 6 (ref 5–15)
BUN: 15 mg/dL (ref 6–20)
CHLORIDE: 116 mmol/L — AB (ref 101–111)
CO2: 19 mmol/L — ABNORMAL LOW (ref 22–32)
Calcium: 7.3 mg/dL — ABNORMAL LOW (ref 8.9–10.3)
Creatinine, Ser: 0.99 mg/dL (ref 0.44–1.00)
GFR calc Af Amer: 58 mL/min — ABNORMAL LOW (ref >60)
GFR calc non Af Amer: 50 mL/min — ABNORMAL LOW (ref >60)
Glucose, Bld: 203 mg/dL — ABNORMAL HIGH (ref 65–99)
POTASSIUM: 3.5 mmol/L (ref 3.5–5.1)
SODIUM: 141 mmol/L (ref 135–145)

## 2016-08-21 LAB — LACTIC ACID, PLASMA
Lactic Acid, Venous: 3.4 mmol/L (ref 0.5–1.9)
Lactic Acid, Venous: 1.9 mmol/L (ref 0.5–1.9)

## 2016-08-21 LAB — TROPONIN I: Troponin I: <0.03 ng/mL (ref <0.03)

## 2016-08-21 MED ORDER — SODIUM CHLORIDE 0.9 % IV BOLUS (SEPSIS)
1000.0000 mL | Freq: Once | INTRAVENOUS | Status: AC
  Administered 2016-08-21: 1000 mL via INTRAVENOUS

## 2016-08-21 MED ORDER — DEXTROSE 5 % IV SOLN: 2.0000 g | INTRAVENOUS | Status: DC

## 2016-08-21 MED ORDER — APIXABAN 2.5 MG PO TABS
2.5000 mg | ORAL_TABLET | Freq: Two times a day (BID) | ORAL | Status: DC
  Administered 2016-08-21 – 2016-08-22 (×3): 2.5 mg via ORAL
  Filled 2016-08-21 (×3): qty 1

## 2016-08-21 MED ORDER — ATORVASTATIN CALCIUM 20 MG PO TABS
20.0000 mg | ORAL_TABLET | Freq: Every day | ORAL | Status: DC
  Administered 2016-08-21: 20 mg via ORAL
  Filled 2016-08-21: qty 1

## 2016-08-21 MED ORDER — AMIODARONE HCL 200 MG PO TABS
200.0000 mg | ORAL_TABLET | Freq: Every day | ORAL | Status: DC
  Administered 2016-08-21 – 2016-08-22 (×2): 200 mg via ORAL
  Filled 2016-08-21 (×2): qty 1

## 2016-08-21 MED ORDER — DEXTROSE 5 % IV SOLN
2.0000 g | Freq: Once | INTRAVENOUS | Status: AC
  Administered 2016-08-21: 2 g via INTRAVENOUS
  Filled 2016-08-21: qty 2

## 2016-08-21 MED ORDER — LISINOPRIL 20 MG PO TABS
20.0000 mg | ORAL_TABLET | Freq: Every day | ORAL | Status: DC
  Administered 2016-08-21 – 2016-08-22 (×2): 20 mg via ORAL
  Filled 2016-08-21 (×2): qty 1

## 2016-08-21 MED ORDER — AMLODIPINE BESYLATE 5 MG PO TABS
5.0000 mg | ORAL_TABLET | Freq: Every day | ORAL | Status: DC
  Administered 2016-08-21 – 2016-08-22 (×2): 5 mg via ORAL
  Filled 2016-08-21 (×2): qty 1

## 2016-08-21 MED ORDER — ONDANSETRON HCL 4 MG/2ML IJ SOLN: 4.0000 mg | Freq: Once | INTRAMUSCULAR | Status: DC

## 2016-08-21 MED ORDER — DEXTROSE 5 % IV SOLN
1.0000 g | INTRAVENOUS | Status: DC
  Administered 2016-08-21: 18:00:00 1 g via INTRAVENOUS
  Filled 2016-08-21: qty 10

## 2016-08-21 MED ORDER — SODIUM CHLORIDE 0.9 % IV SOLN
1.0000 g | Freq: Two times a day (BID) | INTRAVENOUS | Status: DC
  Administered 2016-08-21: 1 g via INTRAVENOUS
  Filled 2016-08-21 (×2): qty 1

## 2016-08-21 MED ORDER — ACETAMINOPHEN 650 MG RE SUPP: 650.0000 mg | Freq: Four times a day (QID) | RECTAL | Status: DC | PRN

## 2016-08-21 MED ORDER — DEXTROSE 50 % IV SOLN
INTRAVENOUS | Status: AC
Start: 2016-08-21 — End: 2016-08-21
  Administered 2016-08-21: 50 mL via INTRAVENOUS
  Filled 2016-08-21: qty 50

## 2016-08-21 MED ORDER — DEXTROSE 50 % IV SOLN
1.0000 | Freq: Once | INTRAVENOUS | Status: AC
  Administered 2016-08-21: 50 mL via INTRAVENOUS

## 2016-08-21 MED ORDER — GABAPENTIN 300 MG PO CAPS
300.0000 mg | ORAL_CAPSULE | Freq: Every day | ORAL | Status: DC
  Administered 2016-08-21: 300 mg via ORAL
  Filled 2016-08-21: qty 1

## 2016-08-21 MED ORDER — INSULIN ASPART 100 UNIT/ML ~~LOC~~ SOLN
0.0000 [IU] | Freq: Three times a day (TID) | SUBCUTANEOUS | Status: DC
  Administered 2016-08-21: 2 [IU] via SUBCUTANEOUS
  Administered 2016-08-22: 1 [IU] via SUBCUTANEOUS
  Filled 2016-08-21 (×2): qty 1

## 2016-08-21 MED ORDER — HYDROCODONE-ACETAMINOPHEN 5-325 MG PO TABS
1.0000 | ORAL_TABLET | ORAL | Status: DC | PRN
  Administered 2016-08-21: 07:00:00 1 via ORAL
  Filled 2016-08-21: qty 1

## 2016-08-21 MED ORDER — ACETAMINOPHEN 325 MG PO TABS: 650.0000 mg | ORAL_TABLET | Freq: Four times a day (QID) | ORAL | Status: DC | PRN

## 2016-08-21 MED ORDER — TAMSULOSIN HCL 0.4 MG PO CAPS
0.4000 mg | ORAL_CAPSULE | Freq: Every day | ORAL | Status: DC
  Administered 2016-08-21 – 2016-08-22 (×2): 0.4 mg via ORAL
  Filled 2016-08-21 (×2): qty 1

## 2016-08-21 MED ORDER — ONDANSETRON HCL 4 MG/2ML IJ SOLN: 4.0000 mg | Freq: Four times a day (QID) | INTRAMUSCULAR | Status: DC | PRN

## 2016-08-21 MED ORDER — ONDANSETRON HCL 4 MG/2ML IJ SOLN
4.0000 mg | Freq: Once | INTRAMUSCULAR | Status: AC
  Administered 2016-08-21: 4 mg via INTRAVENOUS

## 2016-08-21 MED ORDER — METOPROLOL TARTRATE 25 MG PO TABS
25.0000 mg | ORAL_TABLET | Freq: Two times a day (BID) | ORAL | Status: DC
  Administered 2016-08-21 – 2016-08-22 (×3): 25 mg via ORAL
  Filled 2016-08-21 (×3): qty 1

## 2016-08-21 MED ORDER — SODIUM CHLORIDE 0.9 % IV SOLN
INTRAVENOUS | Status: DC
  Administered 2016-08-21: 07:00:00 via INTRAVENOUS

## 2016-08-21 MED ORDER — PANTOPRAZOLE SODIUM 40 MG PO TBEC
40.0000 mg | DELAYED_RELEASE_TABLET | Freq: Every day | ORAL | Status: DC
  Administered 2016-08-21 – 2016-08-22 (×2): 40 mg via ORAL
  Filled 2016-08-21 (×2): qty 1

## 2016-08-21 MED ORDER — SERTRALINE HCL 50 MG PO TABS
25.0000 mg | ORAL_TABLET | Freq: Every day | ORAL | Status: DC
  Administered 2016-08-21 – 2016-08-22 (×2): 25 mg via ORAL
  Filled 2016-08-21 (×2): qty 1

## 2016-08-21 MED ORDER — ONDANSETRON HCL 4 MG PO TABS: 4.0000 mg | ORAL_TABLET | Freq: Four times a day (QID) | ORAL | Status: DC | PRN

## 2016-08-21 MED ORDER — INSULIN ASPART 100 UNIT/ML ~~LOC~~ SOLN: 0.0000 [IU] | Freq: Every day | SUBCUTANEOUS | Status: DC

## 2016-08-21 MED ORDER — ONDANSETRON HCL 4 MG/2ML IJ SOLN
INTRAMUSCULAR | Status: AC
  Filled 2016-08-21: qty 2

## 2016-08-21 NOTE — ED Notes (Signed)
Critical lactic of 3.5 called from lab. Dr. Beather Arbour notified.

## 2016-08-21 NOTE — ED Notes (Signed)
Had to draw lactic acid before pt taken to floor

## 2016-08-21 NOTE — Progress Notes (Addendum)
Pharmacy Antibiotic Note  Sherry [name] is a 83 y.o. female admitted on 08/21/2016 with UTI.  Pharmacy has been consulted for ceftriaxone dosing.  Plan: Ceftriaxone 2 grams q 24 hours ordered. Changed to meropenem 1 gram q 12 hours per consult.     Temp (24hrs), Avg:97.9 F (36.6 C), Min:97.9 F (36.6 C), Max:97.9 F (36.6 C)   Recent Labs Lab 08/21/16 0241  WBC 12.3*  CREATININE 0.80  LATICACIDVEN 3.4*    Estimated Creatinine Clearance: 43.6 mL/min (by C-G formula based on SCr of 0.8 mg/dL).    No Known Allergies  Antimicrobials this admission: ceftriaxone  >>   >>   Dose adjustments this admission:   Microbiology results: 8/13 UCx: pending       8/13 UA: pending Thank you for allowing pharmacy to be a part of this patient's care.  Lenzie Montesano S 08/21/2016 3:40 AM

## 2016-08-21 NOTE — H&P (Signed)
Sherry [name] at [place] NAME: Sherry [name]    MR#:  [id number]  DATE OF BIRTH:  09-Sep-1927  DATE OF ADMISSION:  08/21/2016  PRIMARY CARE PHYSICIAN: [facility] [place]   REQUESTING/REFERRING PHYSICIAN: Beather Arbour, MD  CHIEF COMPLAINT:   Chief Complaint  Patient presents with  . Weakness    HISTORY OF PRESENT ILLNESS:  Sherry [name]  is a 83 y.o. female who presents with Weakness, chills, dysuria. Patient states she's had these symptoms for roughly the past 24 hours. Here in the ED she was found to have UA extremely suspicious for infection. Hospitalists were called for admission  PAST MEDICAL HISTORY:   Past Medical History:  Diagnosis Date  . Anxiety   . Arthritis   . Chronic kidney disease   . Diabetes mellitus without complication ([place])   . Dizziness   . Dyspnea   . GERD (gastroesophageal reflux disease)   . Hyperlipidemia   . Hypertension   . Pulmonary embolism ([place]) 2016    PAST SURGICAL HISTORY:   Past Surgical History:  Procedure Laterality Date  . APPENDECTOMY    . BACK SURGERY    . ESOPHAGOGASTRODUODENOSCOPY N/A 05/29/2016   Procedure: ESOPHAGOGASTRODUODENOSCOPY (EGD);  Surgeon: Clarene Essex, MD;  Location: [facility];  Service: Gastroenterology;  Laterality: N/A;  . SHOULDER SURGERY Left     SOCIAL HISTORY:   Social History  Substance Use Topics  . Smoking status: Never Smoker  . Smokeless tobacco: Never Used  . Alcohol use No    FAMILY HISTORY:   Family History  Problem Relation Age of Onset  . Diabetes Mother   . Cancer Mother   . Cancer Father   . Bladder Cancer Neg Hx   . Kidney cancer Neg Hx     DRUG ALLERGIES:  No Known Allergies  MEDICATIONS AT HOME:   Prior to Admission medications   Medication Sig Start Date End Date Taking? Authorizing Provider  sucralfate (CARAFATE) 1 GM/10ML suspension Take 10 mLs (1 g total) by mouth 4 (four) times daily -  with meals and at bedtime.  06/02/16  Yes Lorella Nimrod, MD  amiodarone (PACERONE) 200 MG tablet Take 1 tablet (200 mg total) by mouth 2 (two) times daily. Change to q daily in 2 weeks Patient taking differently: Take 200 mg by mouth daily.  07/24/16   Gladstone Lighter, MD  amLODipine (NORVASC) 5 MG tablet Take 5 mg by mouth daily.    [provider]  apixaban (ELIQUIS) 2.5 MG TABS tablet Take 1 tablet (2.5 mg total) by mouth 2 (two) times daily. 07/23/16   Gladstone Lighter, MD  atorvastatin (LIPITOR) 20 MG tablet Take 20 mg by mouth at bedtime.     [provider]  docusate sodium (COLACE) 100 MG capsule Take 1 capsule (100 mg total) by mouth 2 (two) times daily. 08/02/16   Gladstone Lighter, MD  ferrous sulfate 325 (65 FE) MG tablet Take 1 tablet (325 mg total) by mouth daily with breakfast. 06/03/16   Lorella Nimrod, MD  gabapentin (NEURONTIN) 300 MG capsule Take 300 mg by mouth at bedtime.     [provider]  lisinopril (PRINIVIL,ZESTRIL) 20 MG tablet Take 1 tablet (20 mg total) by mouth daily. 07/24/16   Gladstone Lighter, MD  magnesium oxide (MAG-OX) 400 (241.3 Mg) MG tablet Take 1 tablet (400 mg total) by mouth daily. 04/27/16   Loletha Grayer, MD  megestrol (MEGACE) 40 MG tablet Take 1 tablet (40 mg  total) by mouth daily. 08/02/16   Gladstone Lighter, MD  meloxicam (MOBIC) 7.5 MG tablet Take 7.5 mg by mouth daily.    [provider]  metoCLOPramide (REGLAN) 5 MG tablet Take 1 tablet (5 mg total) by mouth 2 (two) times daily. 06/02/16   Lorella Nimrod, MD  metoprolol tartrate (LOPRESSOR) 25 MG tablet Take 1 tablet (25 mg total) by mouth 2 (two) times daily. 07/24/16   Gladstone Lighter, MD  Multiple Vitamin (MULTIVITAMIN WITH MINERALS) TABS tablet Take 1 tablet by mouth daily. 06/02/16   Shela Leff, MD  ondansetron (ZOFRAN-ODT) 4 MG disintegrating tablet Take 4 mg by mouth every 6 (six) hours as needed for nausea or vomiting.    [provider]  pantoprazole (PROTONIX)  40 MG tablet Take 1 tablet (40 mg total) by mouth 2 (two) times daily. Patient taking differently: Take 40 mg by mouth daily.  04/27/16   Loletha Grayer, MD  polyethylene glycol [facility]) packet Take 17 g by mouth daily. 08/02/16   Gladstone Lighter, MD  Potassium Chloride ER 20 MEQ TBCR Take 1 tablet by mouth daily.    [provider]  sertraline (ZOLOFT) 25 MG tablet Take 1 tablet (25 mg total) by mouth daily. 06/02/16   Lorella Nimrod, MD  tamsulosin (FLOMAX) 0.4 MG CAPS capsule Take 1 capsule (0.4 mg total) by mouth daily. Patient taking differently: Take 0.4 mg by mouth daily after lunch.  03/14/16   Fritzi Mandes, MD    REVIEW OF SYSTEMS:  Review of Systems  Constitutional: Positive for chills and malaise/fatigue. Negative for fever and weight loss.  HENT: Negative for ear pain, hearing loss and tinnitus.   Eyes: Negative for blurred vision, double vision, pain and redness.  Respiratory: Negative for cough, hemoptysis and shortness of breath.   Cardiovascular: Negative for chest pain, palpitations, orthopnea and leg swelling.  Gastrointestinal: Negative for abdominal pain, constipation, diarrhea, nausea and vomiting.  Genitourinary: Positive for dysuria. Negative for frequency and hematuria.  Musculoskeletal: Negative for back pain, joint pain and neck pain.  Skin:       No acne, rash, or lesions  Neurological: Negative for dizziness, tremors, focal weakness and weakness.  Endo/Heme/Allergies: Negative for polydipsia. Does not bruise/bleed easily.  Psychiatric/Behavioral: Negative for depression. The patient is not nervous/anxious and does not have insomnia.      VITAL SIGNS:   Vitals:   08/21/16 0300 08/21/16 0330 08/21/16 0400 08/21/16 0430  BP: (!) 149/61 (!) 155/77 (!) 165/52 (!) 145/57  Pulse: 92 94 87 90  Resp: 15 18 20 18   Temp:      TempSrc:      SpO2: 99% 98% 99% 100%   Wt Readings from Last 3 Encounters:  08/07/16 58.1 kg (128 lb)  07/28/16 56.2 kg (123 lb  14.4 oz)  07/24/16 59.8 kg (131 lb 14.4 oz)    PHYSICAL EXAMINATION:  Physical Exam  Vitals reviewed. Constitutional: She is oriented to person, place, and time. She appears well-developed and well-nourished. No distress.  HENT:  Head: Normocephalic and atraumatic.  Mouth/Throat: Oropharynx is clear and moist.  Eyes: Pupils are equal, round, and reactive to light. Conjunctivae and EOM are normal. No scleral icterus.  Neck: Normal range of motion. Neck supple. No JVD present. No thyromegaly present.  Cardiovascular: Normal rate, regular rhythm and intact distal pulses.  Exam reveals no gallop and no friction rub.   No murmur heard. Respiratory: Effort normal and breath sounds normal. No respiratory distress. She has no wheezes. She  has no rales.  GI: Soft. Bowel sounds are normal. She exhibits no distension. There is tenderness.  Musculoskeletal: Normal range of motion. She exhibits no edema.  No arthritis, no gout  Lymphadenopathy:    She has no cervical adenopathy.  Neurological: She is alert and oriented to person, place, and time. No cranial nerve deficit.  No dysarthria, no aphasia  Skin: Skin is warm and dry. No rash noted. No erythema.  Psychiatric: She has a normal mood and affect. Her behavior is normal. Judgment and thought content normal.    LABORATORY PANEL:   CBC  Recent Labs Lab 08/21/16 0241  WBC 12.3*  HGB 12.3  HCT 37.7  PLT 293   ------------------------------------------------------------------------------------------------------------------  Chemistries   Recent Labs Lab 08/21/16 0241  NA 143  K 3.0*  CL 111  CO2 20*  GLUCOSE 81  BUN 18  CREATININE 0.80  CALCIUM 8.5*  AST 32  ALT 16  ALKPHOS 88  BILITOT 0.8   ------------------------------------------------------------------------------------------------------------------  Cardiac Enzymes  Recent Labs Lab 08/21/16 0241  TROPONINI <0.03    ------------------------------------------------------------------------------------------------------------------  RADIOLOGY:  Dg Chest Port 1 View  Result Date: 08/21/2016 CLINICAL DATA:  83 y/o  F; weakness today. EXAM: PORTABLE CHEST 1 VIEW COMPARISON:  07/22/2016 chest radiograph FINDINGS: Stable cardiac silhouette within normal limits given projection and technique. Aortic atherosclerosis with calcification. Linear opacities in the lung bases compatible with minor atelectasis. No focal consolidation. No pleural effusion or pneumothorax. Anterior cervical fusion hardware in no acute. No acute osseous abnormality is evident. IMPRESSION: Minor atelectasis in lung bases. No focal consolidation. Aortic atherosclerosis. Stable cardiac silhouette. Electronically Signed   By: Kristine Garbe M.D.   On: 08/21/2016 03:12    EKG:   Orders placed or performed in visit on 08/21/16  . EKG 12-Lead    IMPRESSION AND PLAN:  Principal Problem:   Sepsis ([place]) - blood pressure stable, lactic acid elevated, will give IV fluids and trend lactate into within normal limits. Broad spectrum antibiotics started, cultures sent from the ED Active Problems:   UTI (urinary tract infection) - antibiotics and cultures as above   Diabetes mellitus type 2, insulin dependent (HCC) - sliding scale insulin with corresponding glucose checks   Paroxysmal A. Fib - continue home meds including anticoagulation   Essential hypertension - continue home meds   HLD (hyperlipidemia) - continue home medication  All the records are reviewed and case discussed with ED provider. Management plans discussed with the patient and/or family.  DVT PROPHYLAXIS: Systemic anticoagulation  GI PROPHYLAXIS: PPI  ADMISSION STATUS: Inpatient  CODE STATUS: Full Code Status History    Date Active Date Inactive Code Status Order ID Comments User Context   07/28/2016  9:06 PM 08/02/2016  8:04 PM Full Code [id number]  Lance Coon,  MD Inpatient   07/22/2016  4:13 AM 07/24/2016  5:43 PM Full Code [id number]  Harrie Foreman, MD Inpatient   07/12/2016 10:45 PM 07/18/2016  8:32 PM Full Code [id number]  Hugelmeyer, Alexis, DO Inpatient   05/28/2016 12:01 PM 06/02/2016  7:32 PM Full Code [id number]  Lorella Nimrod, MD Inpatient   04/24/2016  7:27 PM 04/27/2016  8:08 PM Full Code [id number]  Loletha Grayer, MD ED   04/19/2016 12:50 AM 04/22/2016 12:10 AM Full Code [id number]  Lance Coon, MD Inpatient   03/27/2016  9:39 PM 03/29/2016  9:41 PM Full Code [id number]  Fritzi Mandes, MD ED   03/12/2016 12:21 PM 03/13/2016  8:32 PM Full Code [id number]  Dustin Flock,  MD Inpatient   03/09/2016  4:33 PM 03/12/2016 12:21 PM DNR [id number]  Nicholes Mango, MD ED   02/26/2016  9:39 PM 03/01/2016 10:37 PM DNR [id number]  Karmen Bongo, MD Inpatient   02/26/2016  7:48 PM 02/26/2016  9:39 PM DNR [id number]  Dorie Rank, MD ED   01/31/2016  5:45 PM 02/07/2016 11:45 PM DNR [id number]  Flora Lipps, MD Inpatient   01/30/2016 11:19 PM 01/31/2016  5:44 PM Full Code [id number]  Hugelmeyer, Ubaldo Glassing, DO Inpatient   11/26/2014  3:52 PM 11/28/2014  3:07 PM Full Code [id number]  Max Sane, MD Inpatient   11/26/2014  8:59 AM 11/26/2014  3:52 PM Full Code [id number]  Max Sane, MD ED      TOTAL TIME TAKING CARE OF THIS PATIENT: 45 minutes.   Adi Doro Ramona 08/21/2016, 4:41 AM  [facility]  Office  [phone]  CC: Primary care physician; [facility]  Note:  This document was prepared using Set designer software and may include unintentional dictation errors.

## 2016-08-21 NOTE — Progress Notes (Signed)
Patient seen and examined.  Admitted for UTI and sepsis. Lactic acid has normalized. More awake. Waiting for cultures. Continue IV antibiotics. Likely discharge tomorrow.  PT evaluation.

## 2016-08-21 NOTE — ED Provider Notes (Signed)
[facility] Emergency Department Provider Note   ____________________________________________   First MD Initiated Contact with Patient 08/21/16 0230     (approximate)  I have reviewed the triage vital signs and the nursing notes.   HISTORY  Chief Complaint Weakness    HPI Sherry [name] is a 83 y.o. female brought to the ED from home via EMS with a chief complaint of generalized weakness, hypoglycemia, nausea and vomiting. Patient reports awakening this morning with the above symptoms. History of insulin-dependent atrial fibrillation on Eliquis, history of PE, history of GI bleed. Denies associated fever, chills, chest pain, shortness of breath, abdominal pain, dysuria, diarrhea. Denies recent travel or trauma. Has noted increased swelling in her legs and arms. Nothing makes her symptoms better or worse. EMS unable to establish IV but patient was able to take a little orange juice for blood sugar of 64.   Past Medical History:  Diagnosis Date  . Anxiety   . Arthritis   . Chronic kidney disease   . Diabetes mellitus without complication ([place])   . Dizziness   . Dyspnea   . GERD (gastroesophageal reflux disease)   . Hyperlipidemia   . Hypertension   . Pulmonary embolism ([place]) 2016    Patient Active Problem List   Diagnosis Date Noted  . Chronic idiopathic constipation   . HLD (hyperlipidemia) 07/28/2016  . Fecal impaction of colon ([place]) 07/28/2016  . Fecal impaction ([place]) 07/28/2016  . Atrial fibrillation with RVR ([place]) 07/22/2016  . Dehydration   . Renal mass   . Chest pain, rule out acute myocardial infarction 07/12/2016  . Reactive depression   . Esophagitis determined by endoscopy 05/31/2016  . Hyperglycemia   . Hypokalemia   . Cystitis   . HH (hiatus hernia)   . Hiatal hernia with gastroesophageal reflux disease without esophagitis   . GI bleed 05/28/2016  . Upper GI bleeding 05/28/2016  . Pressure injury of skin 05/28/2016  . Atrial  flutter ([place])   . Gastroenteritis 04/24/2016  . Aphasia 04/18/2016  . Nausea & vomiting 03/27/2016  . Advance care planning   . UTI (urinary tract infection) 03/12/2016  . Chest pain 03/09/2016  . PE (pulmonary thromboembolism) ([place]) 02/26/2016  . Diabetes mellitus type 2, insulin dependent ([place]) 02/26/2016  . Anemia 02/26/2016  . Chronic kidney disease (CKD), stage III (moderate) 02/26/2016  . Essential hypertension 02/26/2016  . Goals of care, counseling/discussion   . Adult failure to thrive syndrome   . Palliative care by specialist   . Sepsis ([place]) 11/26/2014    Past Surgical History:  Procedure Laterality Date  . APPENDECTOMY    . BACK SURGERY    . ESOPHAGOGASTRODUODENOSCOPY N/A 05/29/2016   Procedure: ESOPHAGOGASTRODUODENOSCOPY (EGD);  Surgeon: Clarene Essex, MD;  Location: [facility];  Service: Gastroenterology;  Laterality: N/A;  . SHOULDER SURGERY Left     Prior to Admission medications   Medication Sig Start Date End Date Taking? Authorizing Provider  sucralfate (CARAFATE) 1 GM/10ML suspension Take 10 mLs (1 g total) by mouth 4 (four) times daily -  with meals and at bedtime. 06/02/16  Yes Lorella Nimrod, MD  amiodarone (PACERONE) 200 MG tablet Take 1 tablet (200 mg total) by mouth 2 (two) times daily. Change to q daily in 2 weeks Patient taking differently: Take 200 mg by mouth daily.  07/24/16   Gladstone Lighter, MD  amLODipine (NORVASC) 5 MG tablet Take 5 mg by mouth daily.    [provider]  apixaban (  ELIQUIS) 2.5 MG TABS tablet Take 1 tablet (2.5 mg total) by mouth 2 (two) times daily. 07/23/16   Gladstone Lighter, MD  atorvastatin (LIPITOR) 20 MG tablet Take 20 mg by mouth at bedtime.     [provider]  docusate sodium (COLACE) 100 MG capsule Take 1 capsule (100 mg total) by mouth 2 (two) times daily. 08/02/16   Gladstone Lighter, MD  ferrous sulfate 325 (65 FE) MG tablet Take 1 tablet (325 mg total) by mouth daily with breakfast. 06/03/16    Lorella Nimrod, MD  gabapentin (NEURONTIN) 300 MG capsule Take 300 mg by mouth at bedtime.     [provider]  lisinopril (PRINIVIL,ZESTRIL) 20 MG tablet Take 1 tablet (20 mg total) by mouth daily. 07/24/16   Gladstone Lighter, MD  magnesium oxide (MAG-OX) 400 (241.3 Mg) MG tablet Take 1 tablet (400 mg total) by mouth daily. 04/27/16   Loletha Grayer, MD  megestrol (MEGACE) 40 MG tablet Take 1 tablet (40 mg total) by mouth daily. 08/02/16   Gladstone Lighter, MD  meloxicam (MOBIC) 7.5 MG tablet Take 7.5 mg by mouth daily.    [provider]  metoCLOPramide (REGLAN) 5 MG tablet Take 1 tablet (5 mg total) by mouth 2 (two) times daily. 06/02/16   Lorella Nimrod, MD  metoprolol tartrate (LOPRESSOR) 25 MG tablet Take 1 tablet (25 mg total) by mouth 2 (two) times daily. 07/24/16   Gladstone Lighter, MD  Multiple Vitamin (MULTIVITAMIN WITH MINERALS) TABS tablet Take 1 tablet by mouth daily. 06/02/16   Shela Leff, MD  ondansetron (ZOFRAN-ODT) 4 MG disintegrating tablet Take 4 mg by mouth every 6 (six) hours as needed for nausea or vomiting.    [provider]  pantoprazole (PROTONIX) 40 MG tablet Take 1 tablet (40 mg total) by mouth 2 (two) times daily. Patient taking differently: Take 40 mg by mouth daily.  04/27/16   Loletha Grayer, MD  polyethylene glycol [facility]) packet Take 17 g by mouth daily. 08/02/16   Gladstone Lighter, MD  Potassium Chloride ER 20 MEQ TBCR Take 1 tablet by mouth daily.    [provider]  sertraline (ZOLOFT) 25 MG tablet Take 1 tablet (25 mg total) by mouth daily. 06/02/16   Lorella Nimrod, MD  tamsulosin (FLOMAX) 0.4 MG CAPS capsule Take 1 capsule (0.4 mg total) by mouth daily. Patient taking differently: Take 0.4 mg by mouth daily after lunch.  03/14/16   Fritzi Mandes, MD    Allergies Patient has no known allergies.  Family History  Problem Relation Age of Onset  . Diabetes Mother   . Cancer Mother   . Cancer Father   . Bladder  Cancer Neg Hx   . Kidney cancer Neg Hx     Social History Social History  Substance Use Topics  . Smoking status: Never Smoker  . Smokeless tobacco: Never Used  . Alcohol use No    Review of Systems  Constitutional: Positive for generalized weakness. No fever/chills. Eyes: No visual changes. ENT: No sore throat. Cardiovascular: Denies chest pain. Respiratory: Denies shortness of breath. Gastrointestinal: No abdominal pain.  Positive for nausea and vomiting.  No diarrhea.  No constipation. Genitourinary: Negative for dysuria. Musculoskeletal: Negative for back pain. Skin: Negative for rash. Neurological: Negative for headaches, focal weakness or numbness.   ____________________________________________   PHYSICAL EXAM:  VITAL SIGNS: ED Triage Vitals [08/21/16 0221]  Enc Vitals Group     BP (!) 129/100     Pulse Rate (!) 54  Resp 16     Temp 97.9 F (36.6 C)     Temp Source Oral     SpO2 100 %     Weight      Height      Head Circumference      Peak Flow      Pain Score 0     Pain Loc      Pain Edu?      Excl. in [place]?     Constitutional: Alert and oriented. Ill-appearing and in mild acute distress. Shivering. Eyes: Conjunctivae are normal. PERRL. EOMI. Head: Atraumatic. Nose: No congestion/rhinnorhea. Mouth/Throat: Mucous membranes are moist.  Oropharynx non-erythematous. Neck: No stridor.  Supple neck without meningismus. Cardiovascular: Tachycardicl rate, irregular rhythm. Grossly normal heart sounds.  Good peripheral circulation. Respiratory: Normal respiratory effort.  No retractions. Lungs with faint bibasilar rales. Gastrointestinal: Soft and nontender. No distention. No abdominal bruits. No CVA tenderness. Musculoskeletal: No lower extremity tenderness. 1+ pitting BLE edema.  BUE also appear swollen. No joint effusions. Neurologic:  Normal speech and language. No gross focal neurologic deficits are appreciated.  Skin:  Skin is cool, pale, dry and  intact. No rash noted. Psychiatric: Mood and affect are normal. Speech and behavior are normal.  ____________________________________________   LABS (all labs ordered are listed, but only abnormal results are displayed)  Labs Reviewed  CBC WITH DIFFERENTIAL/PLATELET - Abnormal; Notable for the following:       Result Value   WBC 12.3 (*)    RDW 16.3 (*)    Neutro Abs 11.1 (*)    Lymphs Abs 0.8 (*)    All other components within normal limits  COMPREHENSIVE METABOLIC PANEL - Abnormal; Notable for the following:    Potassium 3.0 (*)    CO2 20 (*)    Calcium 8.5 (*)    Total Protein 6.0 (*)    Albumin 2.7 (*)    All other components within normal limits  LACTIC ACID, PLASMA - Abnormal; Notable for the following:    Lactic Acid, Venous 3.4 (*)    All other components within normal limits  URINALYSIS, COMPLETE (UACMP) WITH MICROSCOPIC - Abnormal; Notable for the following:    Color, Urine AMBER (*)    APPearance CLOUDY (*)    Glucose, UA 150 (*)    Hgb urine dipstick LARGE (*)    Protein, ur 100 (*)    Leukocytes, UA LARGE (*)    Bacteria, UA FEW (*)    All other components within normal limits  GLUCOSE, CAPILLARY - Abnormal; Notable for the following:    Glucose-Capillary 54 (*)    All other components within normal limits  GLUCOSE, CAPILLARY - Abnormal; Notable for the following:    Glucose-Capillary 63 (*)    All other components within normal limits  GLUCOSE, CAPILLARY - Abnormal; Notable for the following:    Glucose-Capillary 174 (*)    All other components within normal limits  CULTURE, BLOOD (ROUTINE X 2)  CULTURE, BLOOD (ROUTINE X 2)  URINE CULTURE  LIPASE, BLOOD  TROPONIN I  BRAIN NATRIURETIC PEPTIDE  LACTIC ACID, PLASMA   ____________________________________________  EKG  ED ECG REPORT I, Ty Buntrock J, the attending physician, personally viewed and interpreted this ECG.   Date: 08/21/2016  EKG Time: 0224  Rate: 132  Rhythm: atrial fibrillation, rate  132  Axis: LAD  Intervals:left bundle branch block  ST&T Change: Nonspecific  ____________________________________________  RADIOLOGY  Dg Chest Port 1 View  Result Date: 08/21/2016 CLINICAL DATA:  83 y/o  F; weakness today. EXAM: PORTABLE CHEST 1 VIEW COMPARISON:  07/22/2016 chest radiograph FINDINGS: Stable cardiac silhouette within normal limits given projection and technique. Aortic atherosclerosis with calcification. Linear opacities in the lung bases compatible with minor atelectasis. No focal consolidation. No pleural effusion or pneumothorax. Anterior cervical fusion hardware in no acute. No acute osseous abnormality is evident. IMPRESSION: Minor atelectasis in lung bases. No focal consolidation. Aortic atherosclerosis. Stable cardiac silhouette. Electronically Signed   By: Kristine Garbe M.D.   On: 08/21/2016 03:12    ____________________________________________   PROCEDURES  Procedure(s) performed: None  Procedures  Critical Care performed: Yes, see critical care note(s)   CRITICAL CARE Performed by: Paulette Blanch   Total critical care time: 30 minutes  Critical care time was exclusive of separately billable procedures and treating other patients.  Critical care was necessary to treat or prevent imminent or life-threatening deterioration.  Critical care was time spent personally by me on the following activities: development of treatment plan with patient and/or surrogate as well as nursing, discussions with consultants, evaluation of patient's response to treatment, examination of patient, obtaining history from patient or surrogate, ordering and performing treatments and interventions, ordering and review of laboratory studies, ordering and review of radiographic studies, pulse oximetry and re-evaluation of patient's condition.  ____________________________________________   INITIAL IMPRESSION / ASSESSMENT AND PLAN / ED COURSE  Pertinent labs & imaging  results that were available during my care of the patient were reviewed by me and considered in my medical decision making (see chart for details).  83 year old female with insulin-dependent diabetes, CKD stage III, A. Fib on Eliquis who presents with generalized weakness, nausea/vomiting and hypoglycemia. Will obtain sepsis protocol lab work, chest x-ray and urinalysis. Anticipate hospitalization.  Clinical Course as of Aug 22 419  Mon Aug 21, 2016  0258 Heart rate A. Fib 90-120s. IV established; D50 amp administered.  [JS]  5956 HR 94. Stable BP. Updated patient of laboratory, urinalysis and imaging results. Discussed with hospitalist who will evaluate patient in the emergency department for admission.  [JS]    Clinical Course User Index [JS] Paulette Blanch, MD     ____________________________________________   FINAL CLINICAL IMPRESSION(S) / ED DIAGNOSES  Final diagnoses:  Generalized weakness  Hypokalemia  Hypoglycemia  Sepsis, due to unspecified organism [facility])  Urinary tract infection without hematuria, site unspecified      NEW MEDICATIONS STARTED DURING THIS VISIT:  New Prescriptions   No medications on file     Note:  This document was prepared using Dragon voice recognition software and may include unintentional dictation errors.    Paulette Blanch, MD 08/21/16 [phone]

## 2016-08-21 NOTE — Plan of Care (Signed)
Problem: Education: Goal: Knowledge of [facility] Education information/materials will improve Outcome: Progressing Pt oriented to unit.  Reported back pain 8/10, no PRN analgesic for severe pain ordered.  Dr. Jannifer Franklin paged, received ordered PRN PO Norco 5-325mg  x1.  No other complaints.  Compliant w/ 2h repositioning d/t pressure injury.  Bed in low position, bed alarm on.  Call bell within reach, [place].

## 2016-08-21 NOTE — Progress Notes (Signed)
[facility] responded to a consult requisition for Advanced Directive. [facility] met pt, provided education on how to complete AD. Pt requested for time to review the material with her daughter and will contact [facility] when ready to complete. [facility] informed pt that [facility] services are available as needed.   08/21/16 0900  Clinical Encounter Type  Visited With Patient;Health care provider  Visit Type Initial;Other (Comment)  Referral From Nurse  Consult/Referral To Chaplain  Spiritual Encounters  Spiritual Needs Other (Comment)

## 2016-08-21 NOTE — ED Notes (Signed)
Pt given orange juice at this time, RNs working on obtaining access at this time

## 2016-08-21 NOTE — ED Notes (Signed)
Report from jordyn, rn.  

## 2016-08-21 NOTE — ED Notes (Signed)
Pt states nausea improved. Feet covered with additional warm blankets.

## 2016-08-21 NOTE — ED Notes (Signed)
Pt reports improved nausea. Skin color improved.

## 2016-08-21 NOTE — ED Triage Notes (Signed)
Pt to ED via EMS from home with c/o generalized sickness and weakness today. Pt appears pale and trimerous. Per EMS pt initial glucose was 64 , pt was given oral glucose and came up to 84. Pt A&O but slow to respond.

## 2016-08-22 DIAGNOSIS — A419 Sepsis, unspecified organism: Secondary | ICD-10-CM | POA: Diagnosis not present

## 2016-08-22 LAB — GLUCOSE, CAPILLARY
GLUCOSE-CAPILLARY: 107 mg/dL — AB (ref 65–99)
Glucose-Capillary: 84 mg/dL (ref 65–99)
Glucose-Capillary: 132 mg/dL — ABNORMAL HIGH (ref 65–99)

## 2016-08-22 LAB — BLOOD CULTURE ID PANEL (REFLEXED)
Acinetobacter baumannii: NOT DETECTED
CANDIDA PARAPSILOSIS: NOT DETECTED
CANDIDA TROPICALIS: NOT DETECTED
Candida albicans: NOT DETECTED
Candida glabrata: NOT DETECTED
Candida krusei: NOT DETECTED
ENTEROBACTERIACEAE SPECIES: NOT DETECTED
Enterobacter cloacae complex: NOT DETECTED
Enterococcus species: NOT DETECTED
Escherichia coli: NOT DETECTED
HAEMOPHILUS INFLUENZAE: NOT DETECTED
KLEBSIELLA PNEUMONIAE: NOT DETECTED
Klebsiella oxytoca: NOT DETECTED
Listeria monocytogenes: NOT DETECTED
METHICILLIN RESISTANCE: DETECTED — AB
Neisseria meningitidis: NOT DETECTED
PROTEUS SPECIES: NOT DETECTED
Pseudomonas aeruginosa: NOT DETECTED
SERRATIA MARCESCENS: NOT DETECTED
STAPHYLOCOCCUS AUREUS BCID: NOT DETECTED
STAPHYLOCOCCUS SPECIES: DETECTED — AB
STREPTOCOCCUS PYOGENES: NOT DETECTED
STREPTOCOCCUS SPECIES: NOT DETECTED
Streptococcus agalactiae: NOT DETECTED
Streptococcus pneumoniae: NOT DETECTED

## 2016-08-22 MED ORDER — DEXTROSE 5 % IV SOLN
2.0000 g | INTRAVENOUS | Status: DC
  Filled 2016-08-22: qty 2

## 2016-08-22 MED ORDER — DEXTROSE 5 % IV SOLN
1.0000 g | INTRAVENOUS | Status: DC
  Filled 2016-08-22: qty 10

## 2016-08-22 MED ORDER — CEPHALEXIN 250 MG PO CAPS: 250.0000 mg | ORAL_CAPSULE | Freq: Four times a day (QID) | ORAL | 0 refills | Status: AC

## 2016-08-22 MED ORDER — VANCOMYCIN HCL IN DEXTROSE 1-5 GM/200ML-% IV SOLN
1000.0000 mg | Freq: Once | INTRAVENOUS | Status: AC
  Administered 2016-08-22: 1000 mg via INTRAVENOUS
  Filled 2016-08-22: qty 200

## 2016-08-22 NOTE — Care Management Note (Signed)
Case Management Note  Patient Details  Name: Sherry [name] MRN: [id number] Date of Birth: [birth year]  Subjective/Objective:   Admitted to [facility] with the diagnosis of sepsis, changed to observation status. Lives with son, [name]. Goes to [facility] for physician services and to have prescription filled.    [facility] for skilled nursing, physical therapy, occupational therapy, aide, and social worker arranged 07/28/16. Palliative Care arranged for home at that time also. Flo Shanks, RN representative for [facility] indicated that Palliative Care hasn't been to the home as of yet due to family arrangements. [facility] for skilled nursing. States she does have oxygen in the home, but hasn't used it in six months. Hospital bed, rolling walker, wheelchair, and raised toilet seat in the home. Last fall was 2 months ago. Good appetite. Family will transport.         Action/Plan: Physical therapy evaluation completed. Recommending home with Home Health/physical therapy.  Resume services with [facility]   Expected Discharge Date:  08/22/16               Expected Discharge Plan:     In-House Referral:     Discharge planning Services   yes  Post Acute Care Choice:   yes Choice offered to:   [facility]   DME Arranged:    DME Agency:     HH Arranged:   yes [place] Agency:   Alvis Lemmings  Status of Service:     If discussed at [facility] of Stay Meetings, dates discussed:    Additional Comments:  Shelbie Ammons, [place] [facility] [phone] 08/22/2016, 11:38 AM

## 2016-08-22 NOTE — Progress Notes (Signed)
Patient discharged home per MD orders. All discharge instructions given and all questions answered. Patient verbalized understanding of all discharge instructions.

## 2016-08-22 NOTE — Evaluation (Signed)
Physical Therapy Evaluation Patient Details Name: Sherry [name] MRN: [id number] DOB: 1927/04/06 Today's Date: 08/22/2016   History of Present Illness  Sherry [name] is a 83 y.o. female who presents with weakness, chills, dysuria. Patient states she's had these symptoms for roughly the past 24 hours. Here in the ED she was found to have UA extremely suspicious for infection. Hospitalists were called for admission. She is currently admitted for sepsis secondary to UTI. Pt was recently discharged from [facility] with recommendation from PT for SNF placement. Pt declined placement and did not receive any PT services following discharge.   Clinical Impression  Pt admitted with above diagnosis. Pt currently with functional limitations due to the deficits listed below (see PT Problem List).  Pt is modified independent with bed mobility and CGA for transfers and ambulation. She ambulates with slow but steady cadence partially around RN station. VSS during ambulation. Halfway through ambultion pt reports feeling fatigued. She turned to return to room and pt only made it another 30' before needing to sit down and be rolled back to room due to fatigue. Poor insight into exertion and fatigue level demonstrated by patient. She reports that her mobility is close to her baseline. Pt is close to baseline with respect to her mobility which is somewhat limited with increased falls (4 in the last 6 months). She does not need SNF placement but would benefit from [facility] at discharge as well as assist from family which she already had prior to admission. Pt will benefit from PT services to address deficits in strength, balance, and mobility in order to return to full function at home.     Follow Up Recommendations Home health PT    Equipment Recommendations  None recommended by PT    Recommendations for Other Services       Precautions / Restrictions Precautions Precautions: Fall Restrictions Weight Bearing Restrictions: No       Mobility  Bed Mobility Overal bed mobility: Modified Independent             General bed mobility comments: Increased time but good sequencing  Transfers Overall transfer level: Needs assistance Equipment used: Rolling walker (2 wheeled) Transfers: Sit to/from Stand Sit to Stand: Min guard         General transfer comment: Increased time required. Pt initially unsteady using bed on back of knees for support. Eventually is able to stablize requiring CGA only  Ambulation/Gait Ambulation/Gait assistance: Min guard Ambulation Distance (Feet): 100 Feet Assistive device: Rolling walker (2 wheeled) Gait Pattern/deviations: Step-through pattern Gait velocity: Decreased Gait velocity interpretation: <1.8 ft/sec, indicative of risk for recurrent falls General Gait Details: Pt ambulates with slow but steady cadence partially around RN station with therapy. VSS during ambulation. Halfway through ambultion pt reports feeling fatigued. Turned to return to room and pt only made it another 71' before needing to sit down and be rolled back to room. Poor insight into exertion and fatigue level  Stairs            Wheelchair Mobility    Modified Rankin (Stroke Patients Only)       Balance Overall balance assessment: Needs assistance Sitting-balance support: No upper extremity supported Sitting balance-Leahy Scale: Good     Standing balance support: Bilateral upper extremity supported Standing balance-Leahy Scale: Fair Standing balance comment: Able to maintain balance with feet apart. Unsteady with stepping or placing feet together  Pertinent Vitals/Pain Pain Assessment: No/denies pain    Home Living Family/patient expects to be discharged to:: Private residence Living Arrangements: Children (son) Available Help at Discharge: Family;Available 24 hours/day Type of Home: House Home Access: Level entry     Home Layout: One  level Home Equipment: Walker - 2 wheels;Hospital bed;Shower seat;Wheelchair - manual;Bedside commode;Cane - single point;Other (comment) (no home O2)      Prior Function Level of Independence: Needs assistance   Gait / Transfers Assistance Needed: Ambulates with rolling walker, 4 falls in the last 6 months  ADL's / Homemaking Assistance Needed: Independent with ADLs, son assists with IADLs        Hand Dominance   Dominant Hand: Right    Extremity/Trunk Assessment   Upper Extremity Assessment Upper Extremity Assessment: Overall WFL for tasks assessed    Lower Extremity Assessment Lower Extremity Assessment: Generalized weakness       Communication   Communication: HOH  Cognition Arousal/Alertness: Awake/alert Behavior During Therapy: WFL for tasks assessed/performed Overall Cognitive Status: Within Functional Limits for tasks assessed                                 General Comments: AOx4, at times seems minimally confused regarding details about admission      General Comments      Exercises     Assessment/Plan    PT Assessment Patient needs continued PT services  PT Problem List Decreased strength;Decreased activity tolerance;Decreased balance;Decreased mobility;Decreased safety awareness       PT Treatment Interventions Gait training;DME instruction;Functional mobility training;Balance training;Neuromuscular re-education;Therapeutic exercise;Therapeutic activities;Patient/family education    PT Goals (Current goals can be found in the Care Plan section)  Acute Rehab PT Goals Patient Stated Goal: Return to prior level of function PT Goal Formulation: With patient Time For Goal Achievement: 09/05/16 Potential to Achieve Goals: Fair    Frequency Min 2X/week   Barriers to discharge        Co-evaluation               AM-PAC PT "6 Clicks" Daily Activity  Outcome Measure Difficulty turning over in bed (including adjusting  bedclothes, sheets and blankets)?: A Little Difficulty moving from lying on back to sitting on the side of the bed? : A Little Difficulty sitting down on and standing up from a chair with arms (e.g., wheelchair, bedside commode, etc,.)?: A Little Help needed moving to and from a bed to chair (including a wheelchair)?: A Little Help needed walking in hospital room?: A Little Help needed climbing 3-5 steps with a railing? : A Lot 6 Click Score: 17    End of Session Equipment Utilized During Treatment: Gait belt Activity Tolerance: Patient limited by fatigue Patient left: in bed;with bed alarm set;with call bell/phone within reach;with nursing/sitter in room Nurse Communication: Mobility status;Other (comment) PT Visit Diagnosis: Muscle weakness (generalized) (M62.81);Difficulty in walking, not elsewhere classified (R26.2)    Time: 3382-5053 PT Time Calculation (min) (ACUTE ONLY): 23 min   Charges:   PT Evaluation $PT Eval Low Complexity: 1 Low PT Treatments $Therapeutic Activity: 8-22 mins   PT G Codes:   PT G-Codes **NOT FOR INPATIENT CLASS** Functional Assessment Tool Used: AM-PAC 6 Clicks Basic Mobility Functional Limitation: Mobility: Walking and moving around Mobility: Walking and Moving Around Current Status (Z7673): At least 40 percent but less than 60 percent impaired, limited or restricted Mobility: Walking and Moving Around Goal Status [phone]): At  least 20 percent but less than 40 percent impaired, limited or restricted    Phillips Grout PT, DPT    Srihari Shellhammer 08/22/2016, 11:13 AM

## 2016-08-22 NOTE — Care Management CC44 (Signed)
Condition Code 44 Documentation Completed  Patient Details  Name: Sherry [name] MRN: [id number] Date of Birth: [birth year]   Condition Code 44 given:  Yes Patient signature on Condition Code 44 notice:  Yes Documentation of 2 MD's agreement:  Yes Code 44 added to claim:  Yes    Shelbie Ammons, RN 08/22/2016, 11:34 AM

## 2016-08-22 NOTE — Discharge Instructions (Signed)
Resume diet and activity as before ° ° °

## 2016-08-22 NOTE — Progress Notes (Signed)
PHARMACY - PHYSICIAN COMMUNICATION CRITICAL VALUE ALERT - BLOOD CULTURE IDENTIFICATION (BCID)  Results for orders placed or performed during the hospital encounter of 08/21/16  Blood Culture ID Panel (Reflexed) (Collected: 08/21/2016  3:43 AM)  Result Value Ref Range   Enterococcus species NOT DETECTED NOT DETECTED   Listeria monocytogenes NOT DETECTED NOT DETECTED   Staphylococcus species DETECTED (A) NOT DETECTED   Staphylococcus aureus NOT DETECTED NOT DETECTED   Methicillin resistance DETECTED (A) NOT DETECTED   Streptococcus species NOT DETECTED NOT DETECTED   Streptococcus agalactiae NOT DETECTED NOT DETECTED   Streptococcus pneumoniae NOT DETECTED NOT DETECTED   Streptococcus pyogenes NOT DETECTED NOT DETECTED   Acinetobacter baumannii NOT DETECTED NOT DETECTED   Enterobacteriaceae species NOT DETECTED NOT DETECTED   Enterobacter cloacae complex NOT DETECTED NOT DETECTED   Escherichia coli NOT DETECTED NOT DETECTED   Klebsiella oxytoca NOT DETECTED NOT DETECTED   Klebsiella pneumoniae NOT DETECTED NOT DETECTED   Proteus species NOT DETECTED NOT DETECTED   Serratia marcescens NOT DETECTED NOT DETECTED   Haemophilus influenzae NOT DETECTED NOT DETECTED   Neisseria meningitidis NOT DETECTED NOT DETECTED   Pseudomonas aeruginosa NOT DETECTED NOT DETECTED   Candida albicans NOT DETECTED NOT DETECTED   Candida glabrata NOT DETECTED NOT DETECTED   Candida krusei NOT DETECTED NOT DETECTED   Candida parapsilosis NOT DETECTED NOT DETECTED   Candida tropicalis NOT DETECTED NOT DETECTED    Name of physician (or Provider) Contacted: [facility]  Changes to prescribed antibiotics required: Per MD, continue ceftriaxone for now. Will increase dose to 2 g IV daily.  Lenis Noon, PharmD 08/22/2016  1:46 PM

## 2016-08-22 NOTE — Progress Notes (Addendum)
Please note patient has a PENDING home palliative referral. CMRN Hassan Rowan made aware. Thank you. Flo Shanks RN, BSN, [facility] [place], [facility] [phone] c

## 2016-08-22 NOTE — Progress Notes (Addendum)
Dr Darvin Neighbours notified by text that patient has a blood pressure of 120/34. MD acknowledged, no new orders given.

## 2016-08-23 ENCOUNTER — Ambulatory Visit: Payer: Medicare Other

## 2016-08-23 NOTE — Discharge Summary (Signed)
[facility] NAME: Sherry [name]    MR#:  [id number]  DATE OF BIRTH:  Oct 16, 1927  DATE OF ADMISSION:  08/21/2016 ADMITTING PHYSICIAN: Lance Coon, MD  DATE OF DISCHARGE: 08/22/2016  4:23 PM  PRIMARY CARE PHYSICIAN: [name], MD   ADMISSION DIAGNOSIS:  Hypokalemia [E87.6] Hypoglycemia [E16.2] Generalized weakness [R53.1] Sepsis, due to unspecified organism ([place]) [A41.9] Urinary tract infection without hematuria, site unspecified [N39.0]  DISCHARGE DIAGNOSIS:  Principal Problem:   Sepsis ([place]) Active Problems:   Diabetes mellitus type 2, insulin dependent ([place])   Essential hypertension   UTI (urinary tract infection)   HLD (hyperlipidemia)   SECONDARY DIAGNOSIS:   Past Medical History:  Diagnosis Date  . Anxiety   . Arthritis   . Chronic kidney disease   . Diabetes mellitus without complication ([place])   . Dizziness   . Dyspnea   . GERD (gastroesophageal reflux disease)   . Hyperlipidemia   . Hypertension   . Pulmonary embolism ([place]) 2016     ADMITTING HISTORY  HISTORY OF PRESENT ILLNESS:  Sherry [name]  is a 83 y.o. female who presents with Weakness, chills, dysuria. Patient states she's had these symptoms for roughly the past 24 hours. Here in the ED she was found to have UA extremely suspicious for infection. Hospitalists were called for admission   HOSPITAL COURSE:   * Gram-negative rod UTI Patient was started on IV ceftriaxone in the hospital. She is improving well. Final cultures are not available but patient has improved well on ceftriaxone and will be changed to oral Keflex at discharge. Afebrile. Normal WBC. Her weakness and chills have resolved. Afebrile in the hospital.  Blood cultures showed methicillin-resistant staph species which is thought to be contaminant at this time. No source of infection for this found. She did receive 1 dose of IV vancomycin when initial cultures were called in.  Patient  worked with physical therapy and home health PT has been set up.  Patient's other medications remain the same. Stable multiple comorbidities.  Patient discharged home in stable condition the right straight on if there is any worsening symptoms.  CONSULTS OBTAINED:    DRUG ALLERGIES:  No Known Allergies  DISCHARGE MEDICATIONS:   Discharge Medication List as of 08/22/2016 11:44 AM    START taking these medications   Details  cephALEXin (KEFLEX) 250 MG capsule Take 1 capsule (250 mg total) by mouth 4 (four) times daily., Starting Tue 08/22/2016, Until Sat 08/26/2016, Normal      CONTINUE these medications which have NOT CHANGED   Details  amiodarone (PACERONE) 200 MG tablet Take 1 tablet (200 mg total) by mouth 2 (two) times daily. Change to q daily in 2 weeks, Starting Mon 07/24/2016, Normal    amLODipine (NORVASC) 5 MG tablet Take 5 mg by mouth daily., Historical Med    apixaban (ELIQUIS) 2.5 MG TABS tablet Take 1 tablet (2.5 mg total) by mouth 2 (two) times daily., Starting Sun 07/23/2016, Print    atorvastatin (LIPITOR) 20 MG tablet Take 20 mg by mouth at bedtime. , Historical Med    ferrous sulfate 325 (65 FE) MG tablet Take 1 tablet (325 mg total) by mouth daily with breakfast., Starting Sat 06/03/2016, Normal    gabapentin (NEURONTIN) 300 MG capsule Take 300 mg by mouth at bedtime. , Historical Med    insulin degludec (TRESIBA FLEXTOUCH) 100 UNIT/ML SOPN FlexTouch Pen Inject 10 Units into the skin daily at 10 pm., Historical  Med    insulin detemir (LEVEMIR) 100 UNIT/ML injection Inject 8 Units into the skin daily., Historical Med    lisinopril (PRINIVIL,ZESTRIL) 20 MG tablet Take 1 tablet (20 mg total) by mouth daily., Starting Mon 07/24/2016, Normal    magnesium oxide (MAG-OX) 400 (241.3 Mg) MG tablet Take 1 tablet (400 mg total) by mouth daily., Starting Thu 04/27/2016, Print    meloxicam (MOBIC) 7.5 MG tablet Take 7.5 mg by mouth daily., Historical Med    metoCLOPramide  (REGLAN) 5 MG tablet Take 1 tablet (5 mg total) by mouth 2 (two) times daily., Starting Fri 06/02/2016, Normal    metoprolol tartrate (LOPRESSOR) 25 MG tablet Take 1 tablet (25 mg total) by mouth 2 (two) times daily., Starting Mon 07/24/2016, Normal    Multiple Vitamin (MULTIVITAMIN WITH MINERALS) TABS tablet Take 1 tablet by mouth daily., Starting Fri 06/02/2016, Print    ondansetron (ZOFRAN-ODT) 4 MG disintegrating tablet Take 4 mg by mouth every 6 (six) hours as needed for nausea or vomiting., Historical Med    pantoprazole (PROTONIX) 40 MG tablet Take 1 tablet (40 mg total) by mouth 2 (two) times daily., Starting Thu 04/27/2016, Print    polyethylene glycol (MIRALAX) packet Take 17 g by mouth daily., Starting Wed 08/02/2016, Print    sertraline (ZOLOFT) 25 MG tablet Take 1 tablet (25 mg total) by mouth daily., Starting Fri 06/02/2016, Normal    sucralfate (CARAFATE) 1 GM/10ML suspension Take 10 mLs (1 g total) by mouth 4 (four) times daily -  with meals and at bedtime., Starting Fri 06/02/2016, Normal    tamsulosin (FLOMAX) 0.4 MG CAPS capsule Take 1 capsule (0.4 mg total) by mouth daily., Starting Tue 03/14/2016, Normal    docusate sodium (COLACE) 100 MG capsule Take 1 capsule (100 mg total) by mouth 2 (two) times daily., Starting Wed 08/02/2016, Normal    megestrol (MEGACE) 40 MG tablet Take 1 tablet (40 mg total) by mouth daily., Starting Wed 08/02/2016, Normal    Potassium Chloride ER 20 MEQ TBCR Take 1 tablet by mouth daily., Historical Med        Today   VITAL SIGNS:  Blood pressure (!) 120/34, pulse 62, temperature 98 F (36.7 C), temperature source Oral, resp. rate 18, height 5\' 4"  (1.626 m), weight 60.6 kg (133 lb 9.6 oz), SpO2 98 %.  I/O:  No intake or output data in the 24 hours ending 08/23/16 1432  PHYSICAL EXAMINATION:  Physical Exam  GENERAL:  83 y.o.-year-old patient lying in the bed with no acute distress.  LUNGS: Normal breath sounds bilaterally, no wheezing,  rales,rhonchi or crepitation. No use of accessory muscles of respiration.  CARDIOVASCULAR: S1, S2 normal. No murmurs, rubs, or gallops.  ABDOMEN: Soft, non-tender, non-distended. Bowel sounds present. No organomegaly or mass.  NEUROLOGIC: Moves all 4 extremities. PSYCHIATRIC: The patient is alert and awake SKIN: No obvious rash, lesion, or ulcer.   DATA REVIEW:   CBC  Recent Labs Lab 08/21/16 0241  WBC 12.3*  HGB 12.3  HCT 37.7  PLT 293    Chemistries   Recent Labs Lab 08/21/16 0241 08/21/16 0717  NA 143 141  K 3.0* 3.5  CL 111 116*  CO2 20* 19*  GLUCOSE 81 203*  BUN 18 15  CREATININE 0.80 0.99  CALCIUM 8.5* 7.3*  AST 32  --   ALT 16  --   ALKPHOS 88  --   BILITOT 0.8  --     Cardiac Enzymes  Recent Labs Lab 08/21/16 0241  TROPONINI <0.03    Microbiology Results  Results for orders placed or performed during the hospital encounter of 08/21/16  Urine culture     Status: Abnormal (Preliminary result)   Collection Time: 08/21/16  2:44 AM  Result Value Ref Range Status   Specimen Description URINE, RANDOM  Final   Special Requests NONE  Final   Culture (A)  Final    >=100,000 COLONIES/mL KLEBSIELLA PNEUMONIAE >=100,000 COLONIES/mL GRAM NEGATIVE RODS IDENTIFICATION AND SUSCEPTIBILITIES TO FOLLOW Performed at [facility], [address] [place], [place] [address]    Report Status PENDING  Incomplete  Culture, blood (routine x 2)     Status: Abnormal (Preliminary result)   Collection Time: 08/21/16  3:43 AM  Result Value Ref Range Status   Specimen Description BLOOD BLLA  Final   Special Requests   Final    BOTTLES DRAWN AEROBIC AND ANAEROBIC Blood Culture adequate volume   Culture  Setup Time   Final    GRAM POSITIVE COCCI IN BOTH AEROBIC AND ANAEROBIC BOTTLES CRITICAL VALUE NOTED.  VALUE IS CONSISTENT WITH PREVIOUSLY REPORTED AND CALLED VALUE.    Culture (A)  Final    STAPHYLOCOCCUS SPECIES (COAGULASE NEGATIVE) SUSCEPTIBILITIES TO  FOLLOW Performed at [facility], [place] [address], [place], [place] [address]    Report Status PENDING  Incomplete  Culture, blood (routine x 2)     Status: Abnormal (Preliminary result)   Collection Time: 08/21/16  3:43 AM  Result Value Ref Range Status   Specimen Description BLOOD LFOA  Final   Special Requests   Final    BOTTLES DRAWN AEROBIC AND ANAEROBIC Blood Culture adequate volume   Culture  Setup Time   Final    GRAM POSITIVE COCCI IN BOTH AEROBIC AND ANAEROBIC BOTTLES CRITICAL RESULT CALLED TO, READ BACK BY AND VERIFIED WITH: DAVID BESANTI @ 5916 ON 08/22/2016 BY CAF    Culture (A)  Final    STAPHYLOCOCCUS SPECIES (COAGULASE NEGATIVE) SUSCEPTIBILITIES TO FOLLOW Performed at [facility], [place] [address], [place], [place] [address]    Report Status PENDING  Incomplete  Blood Culture ID Panel (Reflexed)     Status: Abnormal   Collection Time: 08/21/16  3:43 AM  Result Value Ref Range Status   Enterococcus species NOT DETECTED NOT DETECTED Final   Listeria monocytogenes NOT DETECTED NOT DETECTED Final   Staphylococcus species DETECTED (A) NOT DETECTED Final    Comment: Methicillin (oxacillin) resistant coagulase negative staphylococcus. Possible blood culture contaminant (unless isolated from more than one blood culture draw or clinical case suggests pathogenicity). No antibiotic treatment is indicated for blood  culture contaminants. CRITICAL RESULT CALLED TO, READ BACK BY AND VERIFIED WITH: DAVID BESANTI @ 5993 ON 08/22/2016 BY CAF    Staphylococcus aureus NOT DETECTED NOT DETECTED Final   Methicillin resistance DETECTED (A) NOT DETECTED Final    Comment: CRITICAL RESULT CALLED TO, READ BACK BY AND VERIFIED WITH: DAVID BESANTI @ 5701 ON 08/22/2016 BY CAF    Streptococcus species NOT DETECTED NOT DETECTED Final   Streptococcus agalactiae NOT DETECTED NOT DETECTED Final   Streptococcus pneumoniae NOT DETECTED NOT DETECTED Final   Streptococcus pyogenes NOT  DETECTED NOT DETECTED Final   Acinetobacter baumannii NOT DETECTED NOT DETECTED Final   Enterobacteriaceae species NOT DETECTED NOT DETECTED Final   Enterobacter cloacae complex NOT DETECTED NOT DETECTED Final   Escherichia coli NOT DETECTED NOT DETECTED Final   Klebsiella oxytoca NOT DETECTED NOT DETECTED Final  Klebsiella pneumoniae NOT DETECTED NOT DETECTED Final   Proteus species NOT DETECTED NOT DETECTED Final   Serratia marcescens NOT DETECTED NOT DETECTED Final   Haemophilus influenzae NOT DETECTED NOT DETECTED Final   Neisseria meningitidis NOT DETECTED NOT DETECTED Final   Pseudomonas aeruginosa NOT DETECTED NOT DETECTED Final   Candida albicans NOT DETECTED NOT DETECTED Final   Candida glabrata NOT DETECTED NOT DETECTED Final   Candida krusei NOT DETECTED NOT DETECTED Final   Candida parapsilosis NOT DETECTED NOT DETECTED Final   Candida tropicalis NOT DETECTED NOT DETECTED Final    RADIOLOGY:  No results found.  Follow up with PCP in 1 week.  Management plans discussed with the patient, family and they are in agreement.  CODE STATUS:  Code Status History    Date Active Date Inactive Code Status Order ID Comments User Context   08/21/2016  6:15 AM 08/22/2016  7:28 PM Full Code [id number]  Lance Coon, MD Inpatient   07/28/2016  9:06 PM 08/02/2016  8:04 PM Full Code [id number]  Lance Coon, MD Inpatient   07/22/2016  4:13 AM 07/24/2016  5:43 PM Full Code [id number]  Harrie Foreman, MD Inpatient   07/12/2016 10:45 PM 07/18/2016  8:32 PM Full Code [id number]  Hugelmeyer, Alexis, DO Inpatient   05/28/2016 12:01 PM 06/02/2016  7:32 PM Full Code [id number]  Lorella Nimrod, MD Inpatient   04/24/2016  7:27 PM 04/27/2016  8:08 PM Full Code [id number]  Loletha Grayer, MD ED   04/19/2016 12:50 AM 04/22/2016 12:10 AM Full Code [id number]  Lance Coon, MD Inpatient   03/27/2016  9:39 PM 03/29/2016  9:41 PM Full Code [id number]  Fritzi Mandes, MD ED   03/12/2016 12:21 PM 03/13/2016  8:32 PM Full Code  [id number]  Dustin Flock, MD Inpatient   03/09/2016  4:33 PM 03/12/2016 12:21 PM DNR [id number]  Nicholes Mango, MD ED   02/26/2016  9:39 PM 03/01/2016 10:37 PM DNR [id number]  Karmen Bongo, MD Inpatient   02/26/2016  7:48 PM 02/26/2016  9:39 PM DNR [id number]  Dorie Rank, MD ED   01/31/2016  5:45 PM 02/07/2016 11:45 PM DNR [id number]  Flora Lipps, MD Inpatient   01/30/2016 11:19 PM 01/31/2016  5:44 PM Full Code [id number]  Hugelmeyer, Ubaldo Glassing, DO Inpatient   11/26/2014  3:52 PM 11/28/2014  3:07 PM Full Code [id number]  Max Sane, MD Inpatient   11/26/2014  8:59 AM 11/26/2014  3:52 PM Full Code [id number]  Max Sane, MD ED    Advance Directive Documentation     Most Recent Value  Type of Advance Directive  Healthcare Power of Attorney  Pre-existing out of facility DNR order (yellow form or pink MOST form)  -  "MOST" Form in Place?  -      TOTAL TIME TAKING CARE OF THIS PATIENT ON DAY OF DISCHARGE: more than 30 minutes.   Hillary Bow R M.D on 08/23/2016 at 2:32 PM  Between 7am to 6pm - Pager - [phone]  After 6pm go to [email or web address] - password [email or web address] [facility]  Office  [phone]  CC: Primary care physician; [name], MD  Note: This dictation was prepared with Dragon dictation along with smaller phrase technology. Any transcriptional errors that result from this process are unintentional.

## 2016-08-24 LAB — CULTURE, BLOOD (ROUTINE X 2)
Special Requests: ADEQUATE
Special Requests: ADEQUATE

## 2016-08-25 LAB — URINE CULTURE

## 2016-08-25 NOTE — Progress Notes (Addendum)
Pharmacy Note:  Received phone call from [facility] pharmacist regarding cultures. Patient was discharged on cephalexin for UTI. Urine culture resulted post-discharge growing klebsiella and citrobacter resistant to cephalexin. Blood cultures showing 2 sets with CoNS.  Spoke with discharging MD Sudini. Per MD, id patient is febrile or feeling worse then advise them to return to hospital. Otherwise, call in prescription for ciprofloxacin for 5 days.  Attempted to contact patient at both numbers listed in chart. Left voicemail asking her to return phone call to [facility] inpatient pharmacy. Informed MD that I was unable to reach patient.   Lenis Noon, PharmD 08/25/16 2:37 PM  Addendum: Attempted to call patient again with no answer.  Lenis Noon, PharmD 08/25/16 3:51 PM

## 2016-08-25 NOTE — Progress Notes (Addendum)
Pharmacy Note:  Received phone call from [facility] pharmacist regarding cultures. Patient was discharged on cephalexin for UTI. Urine culture resulted post-discharge growing klebsiella and citrobacter resistant to cephalexin. Blood cultures showing 2 sets with CoNS.  Spoke with discharging MD Sudini. Per MD, id patient is febrile or feeling worse then advise them to return to hospital. Otherwise, call in prescription for ciprofloxacin for 5 days.  Attempted to contact patient at both numbers listed in chart. Left voicemail asking her to return phone call to [facility] inpatient pharmacy. Informed MD that I was unable to reach patient.   Additional attempts made to contact the patient 8/17 but there was no answer.   8/18 called both home number and mobile number listed. No answer. Left voicemail on home number with instructions to call [place]. Called patient home pharmacy [facility] to see if they had a different number on file but they did not. Spoke with MD Sudini who is going to try and follow up with primary care physician.   Lendon Ka, PharmD Pharmacy Resident  08/26/2016 12:01 PM

## 2016-08-29 ENCOUNTER — Emergency Department: Payer: Medicare Other

## 2016-08-29 ENCOUNTER — Encounter: Payer: Self-pay | Admitting: Family Medicine

## 2016-08-29 ENCOUNTER — Observation Stay
Admission: EM | Admit: 2016-08-29 | Discharge: 2016-08-31 | Disposition: A | Discharge status: Home / Self Care | Payer: Medicare Other | Attending: Internal Medicine | Admitting: Internal Medicine

## 2016-08-29 DIAGNOSIS — I129 Hypertensive chronic kidney disease with stage 1 through stage 4 chronic kidney disease, or unspecified chronic kidney disease: Secondary | ICD-10-CM | POA: Insufficient documentation

## 2016-08-29 DIAGNOSIS — Z79899 Other long term (current) drug therapy: Secondary | ICD-10-CM | POA: Insufficient documentation

## 2016-08-29 DIAGNOSIS — R55 Syncope and collapse: Secondary | ICD-10-CM | POA: Diagnosis present

## 2016-08-29 DIAGNOSIS — E876 Hypokalemia: Secondary | ICD-10-CM | POA: Insufficient documentation

## 2016-08-29 DIAGNOSIS — E1122 Type 2 diabetes mellitus with diabetic chronic kidney disease: Secondary | ICD-10-CM | POA: Insufficient documentation

## 2016-08-29 DIAGNOSIS — K5904 Chronic idiopathic constipation: Secondary | ICD-10-CM | POA: Insufficient documentation

## 2016-08-29 DIAGNOSIS — I4892 Unspecified atrial flutter: Secondary | ICD-10-CM | POA: Diagnosis not present

## 2016-08-29 DIAGNOSIS — Z7901 Long term (current) use of anticoagulants: Secondary | ICD-10-CM | POA: Diagnosis not present

## 2016-08-29 DIAGNOSIS — K219 Gastro-esophageal reflux disease without esophagitis: Secondary | ICD-10-CM | POA: Diagnosis not present

## 2016-08-29 DIAGNOSIS — D649 Anemia, unspecified: Secondary | ICD-10-CM | POA: Insufficient documentation

## 2016-08-29 DIAGNOSIS — E785 Hyperlipidemia, unspecified: Secondary | ICD-10-CM | POA: Diagnosis not present

## 2016-08-29 DIAGNOSIS — N183 Chronic kidney disease, stage 3 (moderate): Secondary | ICD-10-CM | POA: Diagnosis not present

## 2016-08-29 DIAGNOSIS — Z87440 Personal history of urinary (tract) infections: Secondary | ICD-10-CM | POA: Insufficient documentation

## 2016-08-29 DIAGNOSIS — Z794 Long term (current) use of insulin: Secondary | ICD-10-CM | POA: Insufficient documentation

## 2016-08-29 DIAGNOSIS — F419 Anxiety disorder, unspecified: Secondary | ICD-10-CM | POA: Diagnosis not present

## 2016-08-29 DIAGNOSIS — M1990 Unspecified osteoarthritis, unspecified site: Secondary | ICD-10-CM | POA: Diagnosis not present

## 2016-08-29 DIAGNOSIS — R7989 Other specified abnormal findings of blood chemistry: Secondary | ICD-10-CM

## 2016-08-29 DIAGNOSIS — E11649 Type 2 diabetes mellitus with hypoglycemia without coma: Secondary | ICD-10-CM | POA: Diagnosis not present

## 2016-08-29 DIAGNOSIS — Z86711 Personal history of pulmonary embolism: Secondary | ICD-10-CM | POA: Diagnosis not present

## 2016-08-29 DIAGNOSIS — E162 Hypoglycemia, unspecified: Secondary | ICD-10-CM

## 2016-08-29 DIAGNOSIS — Z791 Long term (current) use of non-steroidal anti-inflammatories (NSAID): Secondary | ICD-10-CM | POA: Diagnosis not present

## 2016-08-29 DIAGNOSIS — R778 Other specified abnormalities of plasma proteins: Secondary | ICD-10-CM

## 2016-08-29 LAB — MAGNESIUM: Magnesium: 1.8 mg/dL (ref 1.7–2.4)

## 2016-08-29 LAB — COMPREHENSIVE METABOLIC PANEL
ALT: 20 U/L (ref 14–54)
ANION GAP: 11 (ref 5–15)
AST: 52 U/L — ABNORMAL HIGH (ref 15–41)
Albumin: 2.6 g/dL — ABNORMAL LOW (ref 3.5–5.0)
Alkaline Phosphatase: 77 U/L (ref 38–126)
BUN: 8 mg/dL (ref 6–20)
CALCIUM: 8.1 mg/dL — AB (ref 8.9–10.3)
CHLORIDE: 107 mmol/L (ref 101–111)
CO2: 21 mmol/L — AB (ref 22–32)
CREATININE: 0.97 mg/dL (ref 0.44–1.00)
GFR, EST AFRICAN AMERICAN: 60 mL/min — AB (ref >60)
GFR, EST NON AFRICAN AMERICAN: 51 mL/min — AB (ref >60)
Glucose, Bld: 42 mg/dL — CL (ref 65–99)
Potassium: 2.4 mmol/L — CL (ref 3.5–5.1)
SODIUM: 139 mmol/L (ref 135–145)
Total Bilirubin: 0.9 mg/dL (ref 0.3–1.2)
Total Protein: 5.4 g/dL — ABNORMAL LOW (ref 6.5–8.1)

## 2016-08-29 LAB — URINALYSIS, COMPLETE (UACMP) WITH MICROSCOPIC
BACTERIA UA: NONE SEEN
Bilirubin Urine: NEGATIVE
GLUCOSE, UA: NEGATIVE mg/dL
Ketones, ur: 5 mg/dL — AB
Leukocytes, UA: NEGATIVE
NITRITE: NEGATIVE
Protein, ur: 30 mg/dL — AB
SPECIFIC GRAVITY, URINE: 1.011 (ref 1.005–1.030)
Squamous Epithelial / LPF: NONE SEEN
pH: 5 (ref 5.0–8.0)

## 2016-08-29 LAB — CBC WITH DIFFERENTIAL/PLATELET
BASOS ABS: 0 10*3/uL (ref 0–0.1)
Basophils Relative: 1 %
Eosinophils Absolute: 0 10*3/uL (ref 0–0.7)
Eosinophils Relative: 0 %
HEMATOCRIT: 29.9 % — AB (ref 35.0–47.0)
HEMOGLOBIN: 10.2 g/dL — AB (ref 12.0–16.0)
LYMPHS PCT: 17 %
Lymphs Abs: 1 10*3/uL (ref 1.0–3.6)
MCH: 28.3 pg (ref 26.0–34.0)
MCHC: 34.2 g/dL (ref 32.0–36.0)
MCV: 82.9 fL (ref 80.0–100.0)
Monocytes Absolute: 0.2 10*3/uL (ref 0.2–0.9)
Monocytes Relative: 4 %
NEUTROS ABS: 4.5 10*3/uL (ref 1.4–6.5)
NEUTROS PCT: 78 %
Platelets: 339 10*3/uL (ref 150–440)
RBC: 3.6 MIL/uL — AB (ref 3.80–5.20)
RDW: 17.2 % — ABNORMAL HIGH (ref 11.5–14.5)
WBC: 5.7 10*3/uL (ref 3.6–11.0)

## 2016-08-29 LAB — GLUCOSE, CAPILLARY
Glucose-Capillary: 35 mg/dL — CL (ref 65–99)
Glucose-Capillary: 139 mg/dL — ABNORMAL HIGH (ref 65–99)
Glucose-Capillary: 159 mg/dL — ABNORMAL HIGH (ref 65–99)

## 2016-08-29 LAB — TROPONIN I
TROPONIN I: 0.03 ng/mL — AB (ref <0.03)
TROPONIN I: 0.06 ng/mL — AB (ref <0.03)
TROPONIN I: 0.07 ng/mL — AB (ref <0.03)

## 2016-08-29 MED ORDER — TAMSULOSIN HCL 0.4 MG PO CAPS
0.4000 mg | ORAL_CAPSULE | Freq: Every day | ORAL | Status: DC
  Administered 2016-08-30 – 2016-08-31 (×2): 0.4 mg via ORAL
  Filled 2016-08-29 (×2): qty 1

## 2016-08-29 MED ORDER — POTASSIUM CHLORIDE ER 20 MEQ PO TBCR: 2.0000 | EXTENDED_RELEASE_TABLET | Freq: Every day | ORAL | Status: DC

## 2016-08-29 MED ORDER — INSULIN ASPART 100 UNIT/ML ~~LOC~~ SOLN
0.0000 [IU] | Freq: Three times a day (TID) | SUBCUTANEOUS | Status: DC
  Administered 2016-08-30 – 2016-08-31 (×2): 1 [IU] via SUBCUTANEOUS
  Filled 2016-08-29 (×2): qty 1

## 2016-08-29 MED ORDER — DOCUSATE SODIUM 100 MG PO CAPS
100.0000 mg | ORAL_CAPSULE | Freq: Two times a day (BID) | ORAL | Status: DC
  Administered 2016-08-29 – 2016-08-31 (×3): 100 mg via ORAL
  Filled 2016-08-29 (×4): qty 1

## 2016-08-29 MED ORDER — ACETAMINOPHEN 650 MG RE SUPP: 650.0000 mg | Freq: Four times a day (QID) | RECTAL | Status: DC | PRN

## 2016-08-29 MED ORDER — INSULIN DETEMIR 100 UNIT/ML ~~LOC~~ SOLN
6.0000 [IU] | Freq: Every day | SUBCUTANEOUS | Status: DC
  Administered 2016-08-29 – 2016-08-30 (×2): 6 [IU] via SUBCUTANEOUS
  Filled 2016-08-29 (×2): qty 0.06

## 2016-08-29 MED ORDER — GABAPENTIN 300 MG PO CAPS
300.0000 mg | ORAL_CAPSULE | Freq: Every day | ORAL | Status: DC
  Administered 2016-08-29 – 2016-08-30 (×2): 300 mg via ORAL
  Filled 2016-08-29 (×2): qty 1

## 2016-08-29 MED ORDER — DEXTROSE 50 % IV SOLN
INTRAVENOUS | Status: AC
  Filled 2016-08-29: qty 50

## 2016-08-29 MED ORDER — SUCRALFATE 1 GM/10ML PO SUSP
1.0000 g | Freq: Three times a day (TID) | ORAL | Status: DC
  Administered 2016-08-29 – 2016-08-31 (×6): 1 g via ORAL
  Filled 2016-08-29 (×10): qty 10

## 2016-08-29 MED ORDER — DEXTROSE 50 % IV SOLN
1.0000 | Freq: Once | INTRAVENOUS | Status: AC
  Administered 2016-08-29: 50 mL via INTRAVENOUS

## 2016-08-29 MED ORDER — MELOXICAM 7.5 MG PO TABS
7.5000 mg | ORAL_TABLET | Freq: Every day | ORAL | Status: DC
  Administered 2016-08-30 – 2016-08-31 (×2): 7.5 mg via ORAL
  Filled 2016-08-29 (×2): qty 1

## 2016-08-29 MED ORDER — ONDANSETRON HCL 4 MG/2ML IJ SOLN: 4.0000 mg | Freq: Four times a day (QID) | INTRAMUSCULAR | Status: DC | PRN

## 2016-08-29 MED ORDER — ENOXAPARIN SODIUM 40 MG/0.4ML ~~LOC~~ SOLN: 40.0000 mg | SUBCUTANEOUS | Status: DC

## 2016-08-29 MED ORDER — POTASSIUM CHLORIDE 20 MEQ/15ML (10%) PO SOLN
40.0000 meq | Freq: Once | ORAL | Status: AC
  Administered 2016-08-29: 40 meq via ORAL
  Filled 2016-08-29: qty 30

## 2016-08-29 MED ORDER — AMIODARONE HCL 200 MG PO TABS
200.0000 mg | ORAL_TABLET | Freq: Every day | ORAL | Status: DC
  Administered 2016-08-30 – 2016-08-31 (×2): 200 mg via ORAL
  Filled 2016-08-29 (×2): qty 1

## 2016-08-29 MED ORDER — LISINOPRIL 20 MG PO TABS
20.0000 mg | ORAL_TABLET | Freq: Every day | ORAL | Status: DC
  Administered 2016-08-30 – 2016-08-31 (×2): 20 mg via ORAL
  Filled 2016-08-29 (×2): qty 1

## 2016-08-29 MED ORDER — METOPROLOL TARTRATE 25 MG PO TABS
25.0000 mg | ORAL_TABLET | Freq: Two times a day (BID) | ORAL | Status: DC
  Administered 2016-08-29 – 2016-08-31 (×4): 25 mg via ORAL
  Filled 2016-08-29 (×4): qty 1

## 2016-08-29 MED ORDER — MAGNESIUM OXIDE 400 (241.3 MG) MG PO TABS
400.0000 mg | ORAL_TABLET | Freq: Every day | ORAL | Status: DC
  Administered 2016-08-30 – 2016-08-31 (×2): 400 mg via ORAL
  Filled 2016-08-29 (×2): qty 1

## 2016-08-29 MED ORDER — PANTOPRAZOLE SODIUM 40 MG PO TBEC
40.0000 mg | DELAYED_RELEASE_TABLET | Freq: Two times a day (BID) | ORAL | Status: DC
  Administered 2016-08-29 – 2016-08-31 (×4): 40 mg via ORAL
  Filled 2016-08-29 (×4): qty 1

## 2016-08-29 MED ORDER — ALBUTEROL SULFATE (2.5 MG/3ML) 0.083% IN NEBU: 2.5000 mg | INHALATION_SOLUTION | RESPIRATORY_TRACT | Status: DC | PRN

## 2016-08-29 MED ORDER — POTASSIUM CHLORIDE 10 MEQ/100ML IV SOLN
10.0000 meq | INTRAVENOUS | Status: AC
Start: 2016-08-29 — End: 2016-08-29
  Filled 2016-08-29 (×2): qty 100

## 2016-08-29 MED ORDER — POTASSIUM CHLORIDE CRYS ER 20 MEQ PO TBCR
40.0000 meq | EXTENDED_RELEASE_TABLET | ORAL | Status: AC
  Administered 2016-08-29: 40 meq via ORAL
  Filled 2016-08-29: qty 2

## 2016-08-29 MED ORDER — SODIUM CHLORIDE 0.9% FLUSH
3.0000 mL | Freq: Two times a day (BID) | INTRAVENOUS | Status: DC
  Administered 2016-08-29 – 2016-08-31 (×4): 3 mL via INTRAVENOUS

## 2016-08-29 MED ORDER — SERTRALINE HCL 50 MG PO TABS
25.0000 mg | ORAL_TABLET | Freq: Every day | ORAL | Status: DC
  Administered 2016-08-30 – 2016-08-31 (×2): 25 mg via ORAL
  Filled 2016-08-29 (×2): qty 1

## 2016-08-29 MED ORDER — ONDANSETRON HCL 4 MG PO TABS: 4.0000 mg | ORAL_TABLET | Freq: Four times a day (QID) | ORAL | Status: DC | PRN

## 2016-08-29 MED ORDER — POLYETHYLENE GLYCOL 3350 17 G PO PACK
17.0000 g | PACK | Freq: Every day | ORAL | Status: DC
  Administered 2016-08-31: 17 g via ORAL
  Filled 2016-08-29 (×2): qty 1

## 2016-08-29 MED ORDER — METOCLOPRAMIDE HCL 5 MG PO TABS
5.0000 mg | ORAL_TABLET | Freq: Two times a day (BID) | ORAL | Status: DC
  Administered 2016-08-29 – 2016-08-31 (×4): 5 mg via ORAL
  Filled 2016-08-29 (×4): qty 1

## 2016-08-29 MED ORDER — ATORVASTATIN CALCIUM 20 MG PO TABS
20.0000 mg | ORAL_TABLET | Freq: Every day | ORAL | Status: DC
  Administered 2016-08-29 – 2016-08-30 (×2): 20 mg via ORAL
  Filled 2016-08-29 (×2): qty 1

## 2016-08-29 MED ORDER — POLYETHYLENE GLYCOL 3350 17 G PO PACK: 17.0000 g | PACK | Freq: Every day | ORAL | Status: DC | PRN

## 2016-08-29 MED ORDER — APIXABAN 2.5 MG PO TABS
2.5000 mg | ORAL_TABLET | Freq: Two times a day (BID) | ORAL | Status: DC
  Administered 2016-08-29 – 2016-08-31 (×4): 2.5 mg via ORAL
  Filled 2016-08-29 (×4): qty 1

## 2016-08-29 MED ORDER — ACETAMINOPHEN 325 MG PO TABS: 650.0000 mg | ORAL_TABLET | Freq: Four times a day (QID) | ORAL | Status: DC | PRN

## 2016-08-29 MED ORDER — MEGESTROL ACETATE 40 MG PO TABS
40.0000 mg | ORAL_TABLET | Freq: Every day | ORAL | Status: DC
  Administered 2016-08-29 – 2016-08-31 (×3): 40 mg via ORAL
  Filled 2016-08-29 (×3): qty 1

## 2016-08-29 NOTE — ED Triage Notes (Signed)
Pt presents to ED from home via EMS c/o weakness and "falling out " today. Glucose prior to arrival was 64; pt now is 35. Pt A&O

## 2016-08-29 NOTE — ED Notes (Signed)
Patient sitting up at bedside eating

## 2016-08-29 NOTE — Progress Notes (Signed)
Discontinued order for enoxaparin since apixaban will be continued on admission. Duplicate therapy.  Lenis Noon, PharmD 08/29/16 4:50 PM

## 2016-08-29 NOTE — Progress Notes (Signed)
Patient admitted to unit. Oriented to room, call bell, and staff. Bed in lowest position. Fall safety plan reviewed. Full assessment to Epic. Skin assessment verified with [facility] RN. Telemetry box verification with tele clerk and Abby NT- Box#: [id number]. Will continue to monitor.

## 2016-08-29 NOTE — ED Notes (Signed)
Pt in CT.

## 2016-08-29 NOTE — ED Provider Notes (Signed)
[facility] Emergency Department Provider Note   ____________________________________________   First MD Initiated Contact with Patient 08/29/16 1258     (approximate)  I have reviewed the triage vital signs and the nursing notes.   HISTORY  Chief Complaint Near Syncope and Weakness    HPI SIGNE TACKITT is a 83 y.o. female who comes in with a history of unresponsiveness today. Daughter and daughter-in-law provides the history they had called the patient in the morning and she was fine and the daughter asked if she wanted anything and she said she wanted some coffee and fast food. Daughter picked up the daughter-in-law went to get fast food and brought to the patient's house and found the patient basically unresponsive they managed to get her around where she was groggy and called EMS. On arrival here in the emergency room her blood sugar was 38. Patient reports she took her insulin but did not eat prior to her arrival here today. Patient denies any headache chest pain shortness of breath coughing sneezing belly pain dysuria or any other complaints.   Past Medical History:  Diagnosis Date  . Anxiety   . Arthritis   . Chronic kidney disease   . Diabetes mellitus without complication ([place])   . Dizziness   . Dyspnea   . GERD (gastroesophageal reflux disease)   . Hyperlipidemia   . Hypertension   . Pulmonary embolism ([place]) 2016    Patient Active Problem List   Diagnosis Date Noted  . Chronic idiopathic constipation   . HLD (hyperlipidemia) 07/28/2016  . Fecal impaction of colon ([place]) 07/28/2016  . Fecal impaction ([place]) 07/28/2016  . Atrial fibrillation with RVR ([place]) 07/22/2016  . Dehydration   . Renal mass   . Chest pain, rule out acute myocardial infarction 07/12/2016  . Reactive depression   . Esophagitis determined by endoscopy 05/31/2016  . Hyperglycemia   . Hypokalemia   . Cystitis   . HH (hiatus hernia)   . Hiatal hernia with  gastroesophageal reflux disease without esophagitis   . GI bleed 05/28/2016  . Upper GI bleeding 05/28/2016  . Pressure injury of skin 05/28/2016  . Atrial flutter ([place])   . Gastroenteritis 04/24/2016  . Aphasia 04/18/2016  . Nausea & vomiting 03/27/2016  . Advance care planning   . UTI (urinary tract infection) 03/12/2016  . Chest pain 03/09/2016  . PE (pulmonary thromboembolism) ([place]) 02/26/2016  . Diabetes mellitus type 2, insulin dependent ([place]) 02/26/2016  . Anemia 02/26/2016  . Chronic kidney disease (CKD), stage III (moderate) 02/26/2016  . Essential hypertension 02/26/2016  . Goals of care, counseling/discussion   . Adult failure to thrive syndrome   . Palliative care by specialist   . Sepsis ([place]) 11/26/2014    Past Surgical History:  Procedure Laterality Date  . APPENDECTOMY    . BACK SURGERY    . ESOPHAGOGASTRODUODENOSCOPY N/A 05/29/2016   Procedure: ESOPHAGOGASTRODUODENOSCOPY (EGD);  Surgeon: Clarene Essex, MD;  Location: [place];  Service: Gastroenterology;  Laterality: N/A;  . SHOULDER SURGERY Left     Prior to Admission medications   Medication Sig Start Date End Date Taking? Authorizing Provider  amiodarone (PACERONE) 200 MG tablet Take 1 tablet (200 mg total) by mouth 2 (two) times daily. Change to q daily in 2 weeks Patient taking differently: Take 200 mg by mouth daily.  07/24/16  Yes Gladstone Lighter, MD  amLODipine (NORVASC) 5 MG tablet Take 5 mg by mouth daily.   Yes [provider]  apixaban (ELIQUIS) 2.5 MG TABS tablet Take 1 tablet (2.5 mg total) by mouth 2 (two) times daily. 07/23/16  Yes Gladstone Lighter, MD  atorvastatin (LIPITOR) 20 MG tablet Take 20 mg by mouth at bedtime.    Yes [provider]  cephALEXin (KEFLEX) 250 MG capsule Take 250 mg by mouth 4 (four) times daily.   Yes [provider]  docusate sodium (COLACE) 100 MG capsule Take 1 capsule (100 mg total) by mouth 2 (two) times daily. 08/02/16  Yes Gladstone Lighter, MD  ferrous sulfate 325 (65 FE) MG tablet Take 1 tablet (325 mg total) by mouth daily with breakfast. 06/03/16  Yes Lorella Nimrod, MD  gabapentin (NEURONTIN) 300 MG capsule Take 300 mg by mouth at bedtime.    Yes [provider]  insulin degludec (TRESIBA FLEXTOUCH) 100 UNIT/ML SOPN FlexTouch Pen Inject 10 Units into the skin daily at 10 pm.   Yes [provider]  insulin detemir (LEVEMIR) 100 UNIT/ML injection Inject 8 Units into the skin daily.   Yes [provider]  lisinopril (PRINIVIL,ZESTRIL) 20 MG tablet Take 1 tablet (20 mg total) by mouth daily. 07/24/16  Yes Gladstone Lighter, MD  magnesium oxide (MAG-OX) 400 (241.3 Mg) MG tablet Take 1 tablet (400 mg total) by mouth daily. 04/27/16  Yes Wieting, Richard, MD  megestrol (MEGACE) 40 MG tablet Take 1 tablet (40 mg total) by mouth daily. 08/02/16  Yes Gladstone Lighter, MD  meloxicam (MOBIC) 7.5 MG tablet Take 7.5 mg by mouth daily.   Yes [provider]  metoCLOPramide (REGLAN) 5 MG tablet Take 1 tablet (5 mg total) by mouth 2 (two) times daily. 06/02/16  Yes Lorella Nimrod, MD  metoprolol tartrate (LOPRESSOR) 25 MG tablet Take 1 tablet (25 mg total) by mouth 2 (two) times daily. 07/24/16  Yes Gladstone Lighter, MD  Multiple Vitamin (MULTIVITAMIN WITH MINERALS) TABS tablet Take 1 tablet by mouth daily. 06/02/16  Yes Shela Leff, MD  ondansetron (ZOFRAN-ODT) 4 MG disintegrating tablet Take 4 mg by mouth every 6 (six) hours as needed for nausea or vomiting.   Yes [provider]  pantoprazole (PROTONIX) 40 MG tablet Take 1 tablet (40 mg total) by mouth 2 (two) times daily. 04/27/16  Yes Wieting, Richard, MD  polyethylene glycol [facility]) packet Take 17 g by mouth daily. 08/02/16  Yes Gladstone Lighter, MD  Potassium Chloride ER 20 MEQ TBCR Take 1 tablet by mouth daily.   Yes [provider]  sertraline (ZOLOFT) 25 MG tablet Take 1 tablet (25 mg total) by mouth daily. 06/02/16  Yes  Lorella Nimrod, MD  sucralfate (CARAFATE) 1 GM/10ML suspension Take 10 mLs (1 g total) by mouth 4 (four) times daily -  with meals and at bedtime. 06/02/16  Yes Lorella Nimrod, MD  tamsulosin (FLOMAX) 0.4 MG CAPS capsule Take 1 capsule (0.4 mg total) by mouth daily. Patient taking differently: Take 0.4 mg by mouth daily after lunch.  03/14/16  Yes Fritzi Mandes, MD    Allergies Patient has no known allergies.  Family History  Problem Relation Age of Onset  . Diabetes Mother   . Cancer Mother   . Cancer Father   . Bladder Cancer Neg Hx   . Kidney cancer Neg Hx     Social History Social History  Substance Use Topics  . Smoking status: Never Smoker  . Smokeless tobacco: Never Used  . Alcohol use No    Review of Systems  Constitutional: No fever/chills Eyes: No visual changes. ENT:  No sore throat. Cardiovascular: Denies chest pain. Respiratory: Denies shortness of breath. Gastrointestinal: No abdominal pain.  No nausea, no vomiting.  No diarrhea.  No constipation. Genitourinary: Negative for dysuria. Musculoskeletal: Negative for back pain. Skin: Negative for rash. Neurological: Negative for headaches, focal weakness   ____________________________________________   PHYSICAL EXAM:  VITAL SIGNS: ED Triage Vitals  Enc Vitals Group     BP 08/29/16 1300 139/68     Pulse Rate 08/29/16 1300 73     Resp 08/29/16 1300 18     Temp 08/29/16 1300 97.8 F (36.6 C)     Temp Source 08/29/16 1300 Oral     SpO2 08/29/16 1300 98 %     Weight 08/29/16 1302 133 lb (60.3 kg)     Height 08/29/16 1302 5\' 4"  (1.626 m)     Head Circumference --      Peak Flow --      Pain Score --      Pain Loc --      Pain Edu? --      Excl. in [place]? --     Constitutional: Alert and oriented. Well appearing and in no acute distress. Eyes: Conjunctivae are normal.  EOMI. Head: Atraumatic. Nose: No congestion/rhinnorhea. Mouth/Throat: Mucous membranes are moist.  Oropharynx non-erythematous. Neck: No  stridor.   Cardiovascular: Normal rate, regular rhythm. Grossly normal heart sounds.  Good peripheral circulation. Respiratory: Normal respiratory effort.  No retractions. Lungs CTAB. Gastrointestinal: Soft and nontender. No distention. No abdominal bruits. No CVA tenderness. Musculoskeletal: No lower extremity tenderness nor edema.  No joint effusions. Neurologic:  Normal speech and language. No gross focal neurologic deficits are appreciated.The patient moving all extremities equally and well Skin:  Skin is warm, dry and intact. No rash noted. Psychiatric: Mood and affect are normal. Speech and behavior are normal.  ____________________________________________   LABS (all labs ordered are listed, but only abnormal results are displayed)  Labs Reviewed  COMPREHENSIVE METABOLIC PANEL - Abnormal; Notable for the following:       Result Value   Potassium 2.4 (*)    CO2 21 (*)    Glucose, Bld 42 (*)    Calcium 8.1 (*)    Total Protein 5.4 (*)    Albumin 2.6 (*)    AST 52 (*)    GFR calc non Af Amer 51 (*)    GFR calc Af Amer 60 (*)    All other components within normal limits  TROPONIN I - Abnormal; Notable for the following:    Troponin I 0.03 (*)    All other components within normal limits  CBC WITH DIFFERENTIAL/PLATELET - Abnormal; Notable for the following:    RBC 3.60 (*)    Hemoglobin 10.2 (*)    HCT 29.9 (*)    RDW 17.2 (*)    All other components within normal limits  GLUCOSE, CAPILLARY - Abnormal; Notable for the following:    Glucose-Capillary 35 (*)    All other components within normal limits  GLUCOSE, CAPILLARY - Abnormal; Notable for the following:    Glucose-Capillary 139 (*)    All other components within normal limits  TROPONIN I - Abnormal; Notable for the following:    Troponin I 0.06 (*)    All other components within normal limits  CULTURE, BLOOD (ROUTINE X 2)  CULTURE, BLOOD (ROUTINE X 2)  MAGNESIUM  CBC WITH DIFFERENTIAL/PLATELET  URINALYSIS,  COMPLETE (UACMP) WITH MICROSCOPIC  CBG MONITORING, ED   ____________________________________________  EKG  EKG read  and interpreted by me shows normal sinus rhythm rate of 73 left axis left bundle-branch block no acute changes compared to old EKGs ____________________________________________  RADIOLOGY   ____________________________________________   PROCEDURES  Procedure(s) performed:   Procedures  Critical Care performed:   ____________________________________________   INITIAL IMPRESSION / ASSESSMENT AND PLAN / ED COURSE  Pertinent labs & imaging results that were available during my care of the patient were reviewed by me and considered in my medical decision making (see chart for details).  Patient's troponin has gone from 0.03 0.06 renal function is not bad at all we will try and put her in the hospital.      ____________________________________________   FINAL CLINICAL IMPRESSION(S) / ED DIAGNOSES  Final diagnoses:  Syncope and collapse  Hypoglycemia  Elevated troponin      NEW MEDICATIONS STARTED DURING THIS VISIT:  New Prescriptions   No medications on file     Note:  This document was prepared using Dragon voice recognition software and may include unintentional dictation errors.    Nena Polio, MD 08/29/16 1630

## 2016-08-29 NOTE — ED Notes (Signed)
Pt spoke with family. Tolerated applesauce

## 2016-08-29 NOTE — H&P (Addendum)
[place] [facility] [place] NAME: Sherry [name]    MR#:  [id number]  DATE OF BIRTH:  14-Jul-1927  DATE OF ADMISSION:  08/29/2016  PRIMARY CARE PHYSICIAN: [name], MD   REQUESTING/REFERRING PHYSICIAN: Dr. Cinda Quest  CHIEF COMPLAINT:   Chief Complaint  Patient presents with  . Near Syncope  . Weakness    HISTORY OF PRESENT ILLNESS:  Sherry [name]  is a 83 y.o. female with a known history of Diabetes mellitus, hypertension, CKD stage III, recurrent UTIs who admits with a walker at home presents to the emergency room after family found her unresponsive. Called EMS and her blood sugars were 38. Patient had spoken with the family member and requested some food. When they arrived patient was unresponsive. She did take her insulin the morning. She was given an amp of D50 and presently is back to normal. She has been found to have mildly elevated troponin of 0.06 which increased from 0.03. Potassium of 2.4. Patient is being admitted for overnight observation. She was recently in the hospital for weakness and UTI.  Klebsiella. At discharge she was prescribed Keflex but the Klebsiella was resistant to this. Presently she does not complain of any urinary frequency or dysuria. Blood cultures were positive for coag negative staph which is methicillin-resistant thought to be a contaminant.  PAST MEDICAL HISTORY:   Past Medical History:  Diagnosis Date  . Anxiety   . Arthritis   . Chronic kidney disease   . Diabetes mellitus without complication ([place])   . Dizziness   . Dyspnea   . GERD (gastroesophageal reflux disease)   . Hyperlipidemia   . Hypertension   . Pulmonary embolism ([place]) 2016    PAST SURGICAL HISTORY:   Past Surgical History:  Procedure Laterality Date  . APPENDECTOMY    . BACK SURGERY    . ESOPHAGOGASTRODUODENOSCOPY N/A 05/29/2016   Procedure: ESOPHAGOGASTRODUODENOSCOPY (EGD);  Surgeon: Clarene Essex, MD;  Location: [place];  Service:  Gastroenterology;  Laterality: N/A;  . SHOULDER SURGERY Left     SOCIAL HISTORY:   Social History  Substance Use Topics  . Smoking status: Never Smoker  . Smokeless tobacco: Never Used  . Alcohol use No    FAMILY HISTORY:   Family History  Problem Relation Age of Onset  . Diabetes Mother   . Cancer Mother   . Cancer Father   . Bladder Cancer Neg Hx   . Kidney cancer Neg Hx     DRUG ALLERGIES:  No Known Allergies  REVIEW OF SYSTEMS:   Review of Systems  Constitutional: Negative for chills, fever and weight loss.  HENT: Negative for hearing loss and nosebleeds.   Eyes: Negative for blurred vision, double vision and pain.  Respiratory: Negative for cough, hemoptysis, sputum production, shortness of breath and wheezing.   Cardiovascular: Negative for chest pain, palpitations, orthopnea and leg swelling.  Gastrointestinal: Negative for abdominal pain, constipation, diarrhea, nausea and vomiting.  Genitourinary: Negative for dysuria and hematuria.  Musculoskeletal: Negative for back pain, falls and myalgias.  Skin: Negative for rash.  Neurological: Positive for loss of consciousness. Negative for dizziness, tremors, sensory change, speech change, focal weakness, seizures and headaches.  Endo/Heme/Allergies: Does not bruise/bleed easily.  Psychiatric/Behavioral: Negative for depression and memory loss. The patient is not nervous/anxious.     MEDICATIONS AT HOME:   Prior to Admission medications   Medication Sig Start Date End Date Taking? Authorizing Provider  amiodarone (PACERONE) 200 MG tablet Take  1 tablet (200 mg total) by mouth 2 (two) times daily. Change to q daily in 2 weeks Patient taking differently: Take 200 mg by mouth daily.  07/24/16  Yes Gladstone Lighter, MD  amLODipine (NORVASC) 5 MG tablet Take 5 mg by mouth daily.   Yes [provider]  apixaban (ELIQUIS) 2.5 MG TABS tablet Take 1 tablet (2.5 mg total) by mouth 2 (two) times daily. 07/23/16  Yes  Gladstone Lighter, MD  atorvastatin (LIPITOR) 20 MG tablet Take 20 mg by mouth at bedtime.    Yes [provider]  cephALEXin (KEFLEX) 250 MG capsule Take 250 mg by mouth 4 (four) times daily.   Yes [provider]  docusate sodium (COLACE) 100 MG capsule Take 1 capsule (100 mg total) by mouth 2 (two) times daily. 08/02/16  Yes Gladstone Lighter, MD  ferrous sulfate 325 (65 FE) MG tablet Take 1 tablet (325 mg total) by mouth daily with breakfast. 06/03/16  Yes Lorella Nimrod, MD  gabapentin (NEURONTIN) 300 MG capsule Take 300 mg by mouth at bedtime.    Yes [provider]  insulin degludec (TRESIBA FLEXTOUCH) 100 UNIT/ML SOPN FlexTouch Pen Inject 10 Units into the skin daily at 10 pm.   Yes [provider]  insulin detemir (LEVEMIR) 100 UNIT/ML injection Inject 8 Units into the skin daily.   Yes [provider]  lisinopril (PRINIVIL,ZESTRIL) 20 MG tablet Take 1 tablet (20 mg total) by mouth daily. 07/24/16  Yes Gladstone Lighter, MD  magnesium oxide (MAG-OX) 400 (241.3 Mg) MG tablet Take 1 tablet (400 mg total) by mouth daily. 04/27/16  Yes Wieting, Richard, MD  megestrol (MEGACE) 40 MG tablet Take 1 tablet (40 mg total) by mouth daily. 08/02/16  Yes Gladstone Lighter, MD  meloxicam (MOBIC) 7.5 MG tablet Take 7.5 mg by mouth daily.   Yes [provider]  metoCLOPramide (REGLAN) 5 MG tablet Take 1 tablet (5 mg total) by mouth 2 (two) times daily. 06/02/16  Yes Lorella Nimrod, MD  metoprolol tartrate (LOPRESSOR) 25 MG tablet Take 1 tablet (25 mg total) by mouth 2 (two) times daily. 07/24/16  Yes Gladstone Lighter, MD  Multiple Vitamin (MULTIVITAMIN WITH MINERALS) TABS tablet Take 1 tablet by mouth daily. 06/02/16  Yes Shela Leff, MD  ondansetron (ZOFRAN-ODT) 4 MG disintegrating tablet Take 4 mg by mouth every 6 (six) hours as needed for nausea or vomiting.   Yes [provider]  pantoprazole (PROTONIX) 40 MG tablet Take 1 tablet (40 mg  total) by mouth 2 (two) times daily. 04/27/16  Yes Wieting, Richard, MD  polyethylene glycol [facility]) packet Take 17 g by mouth daily. 08/02/16  Yes Gladstone Lighter, MD  Potassium Chloride ER 20 MEQ TBCR Take 1 tablet by mouth daily.   Yes [provider]  sertraline (ZOLOFT) 25 MG tablet Take 1 tablet (25 mg total) by mouth daily. 06/02/16  Yes Lorella Nimrod, MD  sucralfate (CARAFATE) 1 GM/10ML suspension Take 10 mLs (1 g total) by mouth 4 (four) times daily -  with meals and at bedtime. 06/02/16  Yes Lorella Nimrod, MD  tamsulosin (FLOMAX) 0.4 MG CAPS capsule Take 1 capsule (0.4 mg total) by mouth daily. Patient taking differently: Take 0.4 mg by mouth daily after lunch.  03/14/16  Yes Fritzi Mandes, MD     VITAL SIGNS:  Blood pressure 139/68, pulse 73, temperature 97.8 F (36.6 C), temperature source Oral, resp. rate 18, height 5\' 4"  (1.626 m), weight 60.3 kg (133 lb), SpO2 98 %.  PHYSICAL EXAMINATION:  Physical Exam  GENERAL:  83 y.o.-year-old patient lying in the bed with no acute distress.  EYES: Pupils equal, round, reactive to light and accommodation. No scleral icterus. Extraocular muscles intact.  HEENT: Head atraumatic, normocephalic. Oropharynx and nasopharynx clear. No oropharyngeal erythema, moist oral mucosa  NECK:  Supple, no jugular venous distention. No thyroid enlargement, no tenderness.  LUNGS: Normal breath sounds bilaterally, no wheezing, rales, rhonchi. No use of accessory muscles of respiration.  CARDIOVASCULAR: S1, S2 normal. No murmurs, rubs, or gallops.  ABDOMEN: Soft, nontender, nondistended. Bowel sounds present. No organomegaly or mass.  EXTREMITIES: No pedal edema, cyanosis, or clubbing. + 2 pedal & radial pulses b/l.   NEUROLOGIC: Cranial nerves II through XII are intact. No focal Motor or sensory deficits appreciated b/l PSYCHIATRIC: The patient is alert and oriented x 3. Good affect.  SKIN: No obvious rash, lesion, or ulcer.   LABORATORY PANEL:    CBC  Recent Labs Lab 08/29/16 1516  WBC 5.7  HGB 10.2*  HCT 29.9*  PLT 339   ------------------------------------------------------------------------------------------------------------------  Chemistries   Recent Labs Lab 08/29/16 1312 08/29/16 1516  NA 139  --   K 2.4*  --   CL 107  --   CO2 21*  --   GLUCOSE 42*  --   BUN 8  --   CREATININE 0.97  --   CALCIUM 8.1*  --   MG  --  1.8  AST 52*  --   ALT 20  --   ALKPHOS 77  --   BILITOT 0.9  --    ------------------------------------------------------------------------------------------------------------------  Cardiac Enzymes  Recent Labs Lab 08/29/16 1516  TROPONINI 0.06*   ------------------------------------------------------------------------------------------------------------------  RADIOLOGY:  Ct Head Wo Contrast  Result Date: 08/29/2016 CLINICAL DATA:  Altered consciousness.  Weakness. EXAM: CT HEAD WITHOUT CONTRAST TECHNIQUE: Contiguous axial images were obtained from the base of the skull through the vertex without intravenous contrast. COMPARISON:  07/12/2016 FINDINGS: Brain: Expected cerebral and cerebellar volume loss for age. Moderate low density in the periventricular white matter likely related to small vessel disease. Remote left parietal cortically based infarct. No mass lesion, hemorrhage, hydrocephalus, acute infarct, intra-axial, or extra-axial fluid collection. Vascular: Intracranial atherosclerosis. Skull: Normal Sinuses/Orbits: Normal imaged portions of the orbits and globes. Clear paranasal sinuses and mastoid air cells. Other: None. IMPRESSION: 1.  No acute intracranial abnormality. 2.  Cerebral atrophy and small vessel ischemic change. 3. Remote left parietal infarct. Electronically Signed   By: Abigail Miyamoto M.D.   On: 08/29/2016 13:54   Dg Chest Portable 1 View  Result Date: 08/29/2016 CLINICAL DATA:  Weakness. EXAM: PORTABLE CHEST 1 VIEW COMPARISON:  August 21, 2016 FINDINGS: Skin  folds over the left chest with no pneumothorax. Mild bibasilar atelectasis. No other interval changes or acute abnormalities. IMPRESSION: No active disease. Electronically Signed   By: Dorise Bullion III M.D   On: 08/29/2016 13:40     IMPRESSION AND PLAN:   * Syncope due to hypoglycemia from missing her breakfast. Patient has mildly elevated troponin likely demand ischemia. We'll admit patient onto medical floor with telemetry monitoring. We'll also need to rule out arrhythmias due to severe hypokalemia.  * Uncontrolled diabetes with hypoglycemia Reduced dose of Lantus. Sliding scale insulin. Counseled regarding not missing of medications or diet.  * Acute on Chronic hypokalemia. Patient is on 20 mg daily Will increase this to 40 mg daily. Continue the lisinopril she is at home. We will replace with oral and IV and  repeat in the morning. Magnesium levels normal.  * Recent blood cultures positive with coag-negative methicillin-resistant Staphylococcus aureus. 4 out of 4 bottles positive. But patient has no fever no WBC. Likely contaminant. But we will repeat blood cultures to make sure these are negative.  * DVT prophylaxis with Lovenox  Likely discharge tomorrow  All the records are reviewed and case discussed with ED provider. Management plans discussed with the patient, family and they are in agreement.  CODE STATUS: FULL CODE  TOTAL TIME TAKING CARE OF THIS PATIENT: 40 minutes.   Hillary Bow R M.D on 08/29/2016 at 4:46 PM  Between 7am to 6pm - Pager - [phone]  After 6pm go to [email or web address] - password [email or web address]  Office  [phone]  CC: Primary care physician; [name], MD  Note: This dictation was prepared with Dragon dictation along with smaller phrase technology. Any transcriptional errors that result from this process are unintentional.

## 2016-08-30 ENCOUNTER — Ambulatory Visit: Admission: RE | Admit: 2016-08-30 | Payer: Medicare Other | Source: Ambulatory Visit | Admitting: Urology

## 2016-08-30 ENCOUNTER — Encounter: Admission: RE | Payer: Self-pay | Source: Ambulatory Visit

## 2016-08-30 DIAGNOSIS — E11649 Type 2 diabetes mellitus with hypoglycemia without coma: Secondary | ICD-10-CM | POA: Diagnosis not present

## 2016-08-30 LAB — BASIC METABOLIC PANEL
Anion gap: 6 (ref 5–15)
BUN: 8 mg/dL (ref 6–20)
CHLORIDE: 110 mmol/L (ref 101–111)
CO2: 26 mmol/L (ref 22–32)
Calcium: 7.7 mg/dL — ABNORMAL LOW (ref 8.9–10.3)
Creatinine, Ser: 0.98 mg/dL (ref 0.44–1.00)
GFR calc non Af Amer: 51 mL/min — ABNORMAL LOW (ref >60)
GFR, EST AFRICAN AMERICAN: 59 mL/min — AB (ref >60)
Glucose, Bld: 42 mg/dL — CL (ref 65–99)
POTASSIUM: 2.5 mmol/L — AB (ref 3.5–5.1)
SODIUM: 142 mmol/L (ref 135–145)

## 2016-08-30 LAB — GLUCOSE, CAPILLARY
GLUCOSE-CAPILLARY: 161 mg/dL — AB (ref 65–99)
GLUCOSE-CAPILLARY: 75 mg/dL (ref 65–99)
Glucose-Capillary: 35 mg/dL — CL (ref 65–99)
Glucose-Capillary: 91 mg/dL (ref 65–99)
Glucose-Capillary: 125 mg/dL — ABNORMAL HIGH (ref 65–99)
Glucose-Capillary: 208 mg/dL — ABNORMAL HIGH (ref 65–99)

## 2016-08-30 LAB — POTASSIUM
POTASSIUM: 3.8 mmol/L (ref 3.5–5.1)
Potassium: 3.6 mmol/L (ref 3.5–5.1)

## 2016-08-30 LAB — MAGNESIUM: MAGNESIUM: 1.6 mg/dL — AB (ref 1.7–2.4)

## 2016-08-30 SURGERY — URETEROSCOPY
Anesthesia: Choice | Laterality: Right

## 2016-08-30 MED ORDER — POTASSIUM CHLORIDE CRYS ER 20 MEQ PO TBCR
40.0000 meq | EXTENDED_RELEASE_TABLET | Freq: Once | ORAL | Status: AC
  Administered 2016-08-30: 40 meq via ORAL
  Filled 2016-08-30: qty 2

## 2016-08-30 MED ORDER — DEXTROSE 50 % IV SOLN: 1.0000 | Freq: Once | INTRAVENOUS | Status: AC

## 2016-08-30 MED ORDER — MAGNESIUM SULFATE 2 GM/50ML IV SOLN
2.0000 g | Freq: Once | INTRAVENOUS | Status: AC
  Administered 2016-08-30: 2 g via INTRAVENOUS
  Filled 2016-08-30: qty 50

## 2016-08-30 MED ORDER — POTASSIUM CHLORIDE 10 MEQ/100ML IV SOLN
10.0000 meq | INTRAVENOUS | Status: AC
  Administered 2016-08-30 (×4): 10 meq via INTRAVENOUS
  Filled 2016-08-30 (×4): qty 100

## 2016-08-30 MED ORDER — DEXTROSE 50 % IV SOLN
INTRAVENOUS | Status: AC
  Administered 2016-08-30: 06:00:00
  Filled 2016-08-30: qty 50

## 2016-08-30 NOTE — Consult Note (Signed)
MEDICATION RELATED CONSULT NOTE - INITIAL   Pharmacy Consult for electrolytes Indication: hypokalemia  No Known Allergies  Patient Measurements: Height: 5\' 4"  (162.6 cm) Weight: 130 lb 6.4 oz (59.1 kg) IBW/kg (Calculated) : 54.7   Vital Signs: Temp: 98.1 F (36.7 C) (08/22 1138) Temp Source: Oral (08/22 0850) BP: 133/55 (08/22 1138) Pulse Rate: 59 (08/22 1138) Intake/Output from previous day: No intake/output data recorded. Intake/Output from this shift: Total I/O In: 480 [P.O.:480] Out: 351 [Urine:350; Stool:1]  Labs:  Recent Labs  08/29/16 1312 08/29/16 1516 08/30/16 0426  WBC  --  5.7  --   HGB  --  10.2*  --   HCT  --  29.9*  --   PLT  --  339  --   CREATININE 0.97  --  0.98  MG  --  1.8 1.6*  ALBUMIN 2.6*  --   --   PROT 5.4*  --   --   AST 52*  --   --   ALT 20  --   --   ALKPHOS 77  --   --   BILITOT 0.9  --   --    Estimated Creatinine Clearance: 35.6 mL/min (by C-G formula based on SCr of 0.98 mg/dL).   Assessment: Patient is a 83 year old female who was found by family unresponsive. Pt had a BG of 38 when EMS arrived. K was 2.4. MG this AM is 1.6  Goal of Therapy:  Normalization of electrolytes  Plan:  K = 3.8 after supplementation. No additional supplementation needed at this time. Will recheck electrolytes with AM labs tomorrow.  Lenis Noon, Pharm.D, BCPS Clinical Pharmacist 08/30/2016,3:42 PM

## 2016-08-30 NOTE — Discharge Instructions (Addendum)
Patient advised to check blood sugar at least 3 times a day and keep a log of it

## 2016-08-30 NOTE — Care Management (Signed)
This patient has has approximately 18 presentations to [facility], [facility] since jan 2018.  She has had 11 admissions in the last 6 months.  Spoke with [place] regarding services in the home and how care is being managed.  Patient and her family declining any type of skilled nursing placement.  The longest span without presentation to an acute level facility is when patient is in skilled nursing. Looking at status of medicaid pcs application

## 2016-08-30 NOTE — Progress Notes (Signed)
PT Cancellation Note  Patient Details Name: GREGORIA SELVY MRN: [id number] DOB: 18-Dec-1927   Cancelled Treatment:    Reason Eval/Treat Not Completed: Medical issues which prohibited therapy. Consult received and chart reviewed. Pt currently with critical K+ level at 2.5. Not appropriate for PT intervention at this time. Will re-attempt when medically stable.   Cormac Wint 08/30/2016, 11:22 AM  Greggory Stallion, PT, DPT [phone]

## 2016-08-30 NOTE — Care Management (Signed)
Spoke with patient about frequent admissions.  Says when her son goes to work "he puts me in the chair and says do not get up."   When asked how she gets up to go to the bathroom says that "he takes me before he leaves."  Son leaves for work about 6:45A and gets off work around [employer].  Patient says her daughter comes by to check. When asked if she get up to get something to eats she says "sometimes."  It is not clear if patient is getting regularly schedule meals- or components of the meals.  Wonder if nutritional intake is affecting patient blood sugar.  She takes insulin and it does not sound like blood sugars are checked on a regular basis. May be of benefit to keep glycemic records and nutrional logs. When this CM assisted with on previous stay, it was discussed with daughter that patient should not be left alone.  Patient is adamant that she does not want to go to skilled nursing facility. Will update [employer]

## 2016-08-30 NOTE — Consult Note (Signed)
MEDICATION RELATED CONSULT NOTE - INITIAL   Pharmacy Consult for electrolytes Indication: hypokalemia  No Known Allergies  Patient Measurements: Height: 5\' 4"  (162.6 cm) Weight: 130 lb 6.4 oz (59.1 kg) IBW/kg (Calculated) : 54.7 Adjusted Body Weight:   Vital Signs: Temp: 98.4 F (36.9 C) (08/22 0850) Temp Source: Oral (08/22 0850) BP: 140/47 (08/22 0850) Pulse Rate: 71 (08/22 0850) Intake/Output from previous day: No intake/output data recorded. Intake/Output from this shift: Total I/O In: 480 [P.O.:480] Out: 200 [Urine:200]  Labs:  Recent Labs  08/29/16 1312 08/29/16 1516 08/30/16 0426  WBC  --  5.7  --   HGB  --  10.2*  --   HCT  --  29.9*  --   PLT  --  339  --   CREATININE 0.97  --  0.98  MG  --  1.8 1.6*  ALBUMIN 2.6*  --   --   PROT 5.4*  --   --   AST 52*  --   --   ALT 20  --   --   ALKPHOS 77  --   --   BILITOT 0.9  --   --    Estimated Creatinine Clearance: 35.6 mL/min (by C-G formula based on SCr of 0.98 mg/dL).   Microbiology: Recent Results (from the past 720 hour(s))  Urine culture     Status: Abnormal   Collection Time: 08/21/16  2:44 AM  Result Value Ref Range Status   Specimen Description URINE, RANDOM  Final   Special Requests NONE  Final   Culture (A)  Final    >=100,000 COLONIES/mL KLEBSIELLA PNEUMONIAE >=100,000 COLONIES/mL CITROBACTER YOUNGAE    Report Status 08/25/2016 FINAL  Final   Organism ID, Bacteria KLEBSIELLA PNEUMONIAE (A)  Final   Organism ID, Bacteria CITROBACTER YOUNGAE (A)  Final      Susceptibility   Citrobacter youngae - MIC*    CEFAZOLIN >=64 RESISTANT Resistant     CEFTRIAXONE <=1 SENSITIVE Sensitive     CIPROFLOXACIN <=0.25 SENSITIVE Sensitive     GENTAMICIN <=1 SENSITIVE Sensitive     IMIPENEM 1 SENSITIVE Sensitive     NITROFURANTOIN <=16 SENSITIVE Sensitive     TRIMETH/SULFA <=20 SENSITIVE Sensitive     PIP/TAZO <=4 SENSITIVE Sensitive     * >=100,000 COLONIES/mL CITROBACTER YOUNGAE   Klebsiella  pneumoniae - MIC*    AMPICILLIN >=32 RESISTANT Resistant     CEFAZOLIN <=4 SENSITIVE Sensitive     CEFTRIAXONE <=1 SENSITIVE Sensitive     CIPROFLOXACIN <=0.25 SENSITIVE Sensitive     GENTAMICIN <=1 SENSITIVE Sensitive     IMIPENEM <=0.25 SENSITIVE Sensitive     NITROFURANTOIN 64 INTERMEDIATE Intermediate     TRIMETH/SULFA <=20 SENSITIVE Sensitive     AMPICILLIN/SULBACTAM 4 SENSITIVE Sensitive     PIP/TAZO <=4 SENSITIVE Sensitive     Extended ESBL NEGATIVE Sensitive     * >=100,000 COLONIES/mL KLEBSIELLA PNEUMONIAE  Culture, blood (routine x 2)     Status: Abnormal   Collection Time: 08/21/16  3:43 AM  Result Value Ref Range Status   Specimen Description BLOOD BLLA  Final   Special Requests   Final    BOTTLES DRAWN AEROBIC AND ANAEROBIC Blood Culture adequate volume   Culture  Setup Time   Final    GRAM POSITIVE COCCI IN BOTH AEROBIC AND ANAEROBIC BOTTLES CRITICAL VALUE NOTED.  VALUE IS CONSISTENT WITH PREVIOUSLY REPORTED AND CALLED VALUE.    Culture (A)  Final    STAPHYLOCOCCUS SPECIES (COAGULASE NEGATIVE) SUSCEPTIBILITIES  PERFORMED ON PREVIOUS CULTURE WITHIN THE LAST 5 DAYS. Performed at [facility], [place] [address] [place], [place] [address]    Report Status 08/24/2016 FINAL  Final  Culture, blood (routine x 2)     Status: Abnormal   Collection Time: 08/21/16  3:43 AM  Result Value Ref Range Status   Specimen Description BLOOD LFOA  Final   Special Requests   Final    BOTTLES DRAWN AEROBIC AND ANAEROBIC Blood Culture adequate volume   Culture  Setup Time   Final    GRAM POSITIVE COCCI IN BOTH AEROBIC AND ANAEROBIC BOTTLES CRITICAL RESULT CALLED TO, READ BACK BY AND VERIFIED WITH: DAVID BESANTI @ [id number] ON 08/22/2016 BY CAF    Culture STAPHYLOCOCCUS SPECIES (COAGULASE NEGATIVE) (A)  Final   Report Status 08/24/2016 FINAL  Final   Organism ID, Bacteria STAPHYLOCOCCUS SPECIES (COAGULASE NEGATIVE)  Final      Susceptibility   Staphylococcus species (coagulase  negative) - MIC*    CIPROFLOXACIN >=8 RESISTANT Resistant     ERYTHROMYCIN <=0.25 SENSITIVE Sensitive     GENTAMICIN <=0.5 SENSITIVE Sensitive     OXACILLIN >=4 RESISTANT Resistant     TETRACYCLINE 2 SENSITIVE Sensitive     VANCOMYCIN 2 SENSITIVE Sensitive     TRIMETH/SULFA 80 RESISTANT Resistant     CLINDAMYCIN <=0.25 SENSITIVE Sensitive     RIFAMPIN <=0.5 SENSITIVE Sensitive     Inducible Clindamycin NEGATIVE Sensitive     * STAPHYLOCOCCUS SPECIES (COAGULASE NEGATIVE)  Blood Culture ID Panel (Reflexed)     Status: Abnormal   Collection Time: 08/21/16  3:43 AM  Result Value Ref Range Status   Enterococcus species NOT DETECTED NOT DETECTED Final   Listeria monocytogenes NOT DETECTED NOT DETECTED Final   Staphylococcus species DETECTED (A) NOT DETECTED Final    Comment: Methicillin (oxacillin) resistant coagulase negative staphylococcus. Possible blood culture contaminant (unless isolated from more than one blood culture draw or clinical case suggests pathogenicity). No antibiotic treatment is indicated for blood  culture contaminants. CRITICAL RESULT CALLED TO, READ BACK BY AND VERIFIED WITH: DAVID BESANTI @ [id number] ON 08/22/2016 BY CAF    Staphylococcus aureus NOT DETECTED NOT DETECTED Final   Methicillin resistance DETECTED (A) NOT DETECTED Final    Comment: CRITICAL RESULT CALLED TO, READ BACK BY AND VERIFIED WITH: DAVID BESANTI @ [id number] ON 08/22/2016 BY CAF    Streptococcus species NOT DETECTED NOT DETECTED Final   Streptococcus agalactiae NOT DETECTED NOT DETECTED Final   Streptococcus pneumoniae NOT DETECTED NOT DETECTED Final   Streptococcus pyogenes NOT DETECTED NOT DETECTED Final   Acinetobacter baumannii NOT DETECTED NOT DETECTED Final   Enterobacteriaceae species NOT DETECTED NOT DETECTED Final   Enterobacter cloacae complex NOT DETECTED NOT DETECTED Final   Escherichia coli NOT DETECTED NOT DETECTED Final   Klebsiella oxytoca NOT DETECTED NOT DETECTED Final   Klebsiella  pneumoniae NOT DETECTED NOT DETECTED Final   Proteus species NOT DETECTED NOT DETECTED Final   Serratia marcescens NOT DETECTED NOT DETECTED Final   Haemophilus influenzae NOT DETECTED NOT DETECTED Final   Neisseria meningitidis NOT DETECTED NOT DETECTED Final   Pseudomonas aeruginosa NOT DETECTED NOT DETECTED Final   Candida albicans NOT DETECTED NOT DETECTED Final   Candida glabrata NOT DETECTED NOT DETECTED Final   Candida krusei NOT DETECTED NOT DETECTED Final   Candida parapsilosis NOT DETECTED NOT DETECTED Final   Candida tropicalis NOT DETECTED NOT DETECTED Final  CULTURE, BLOOD (ROUTINE X 2) w Reflex to ID Panel  Status: None (Preliminary result)   Collection Time: 08/29/16  4:56 PM  Result Value Ref Range Status   Specimen Description BLOOD LEFT ANTECUBITAL  Final   Special Requests   Final    BOTTLES DRAWN AEROBIC AND ANAEROBIC Blood Culture results may not be optimal due to an inadequate volume of blood received in culture bottles   Culture NO GROWTH < 24 HOURS  Final   Report Status PENDING  Incomplete  CULTURE, BLOOD (ROUTINE X 2) w Reflex to ID Panel     Status: None (Preliminary result)   Collection Time: 08/30/16  4:26 AM  Result Value Ref Range Status   Specimen Description BLOOD RIGHT WRIST  Final   Special Requests   Final    BOTTLES DRAWN AEROBIC AND ANAEROBIC Blood Culture results may not be optimal due to an inadequate volume of blood received in culture bottles   Culture NO GROWTH < 12 HOURS  Final   Report Status PENDING  Incomplete    Medical History: Past Medical History:  Diagnosis Date  . Anxiety   . Arthritis   . Chronic kidney disease   . Diabetes mellitus without complication ([place])   . Dizziness   . Dyspnea   . GERD (gastroesophageal reflux disease)   . Hyperlipidemia   . Hypertension   . Pulmonary embolism ([place]) 2016    Medications:  Scheduled:  . amiodarone  200 mg Oral Daily  . apixaban  2.5 mg Oral BID  . atorvastatin  20 mg Oral  QHS  . docusate sodium  100 mg Oral BID  . gabapentin  300 mg Oral QHS  . insulin aspart  0-9 Units Subcutaneous TID WC  . insulin detemir  6 Units Subcutaneous Daily  . lisinopril  20 mg Oral Daily  . magnesium oxide  400 mg Oral Daily  . megestrol  40 mg Oral Daily  . meloxicam  7.5 mg Oral Daily  . metoCLOPramide  5 mg Oral BID  . metoprolol tartrate  25 mg Oral BID  . pantoprazole  40 mg Oral BID  . polyethylene glycol  17 g Oral Daily  . sertraline  25 mg Oral Daily  . sodium chloride flush  3 mL Intravenous Q12H  . sucralfate  1 g Oral TID WC & HS  . tamsulosin  0.4 mg Oral Daily    Assessment: Patient is a 83 year old female who was found by family unresponsive. Pt had a BG of 38 when EMS arrived. K was 2.4. MG this AM is 1.6  Goal of Therapy:  Normalization of electrolytes  Plan:  Pt was given 80 MEQ po last night, 40 MEQ po this AM and was ordered KCL 10 MEQ IV x 4. Will check K after runs of IV KCL finish.  MG IV 2g once. Pt also ordered Mag Ox 400mg  po daily  Sherry [name], Pharm.D, BCPS Clinical Pharmacist  08/30/2016,11:30 AM

## 2016-08-30 NOTE — Progress Notes (Signed)
Hypoglycemic Event  CBG: 45 per lab 35 via CBG   Treatment:  1 ampule D50. Md notified   Symptoms: none. Patient sleeping   Follow-up CBG: Time:0058 CBG Result: 161  Possible Reasons for Event: bedtime Levemir 6 units. CBG at time 159   Comments/MD notified: yes. [place]

## 2016-08-30 NOTE — Progress Notes (Signed)
Critical potassium level called to MD. Acknowledged. New orders placed.

## 2016-08-30 NOTE — Progress Notes (Signed)
[place] [facility] [place] NAME: Sherry [name]    MR#:  [id number]  DATE OF BIRTH:  03-Jul-1927  SUBJECTIVE:  Denies any complaints this morning. Feels good.  REVIEW OF SYSTEMS:   Review of Systems  Constitutional: Negative for chills, fever and weight loss.  HENT: Negative for ear discharge, ear pain and nosebleeds.   Eyes: Negative for blurred vision, pain and discharge.  Respiratory: Negative for sputum production, shortness of breath, wheezing and stridor.   Cardiovascular: Negative for chest pain, palpitations, orthopnea and PND.  Gastrointestinal: Negative for abdominal pain, diarrhea, nausea and vomiting.  Genitourinary: Negative for frequency and urgency.  Musculoskeletal: Negative for back pain and joint pain.  Neurological: Positive for weakness. Negative for sensory change, speech change and focal weakness.  Psychiatric/Behavioral: Negative for depression and hallucinations. The patient is not nervous/anxious.    Tolerating Diet:yes Tolerating PT: pending  DRUG ALLERGIES:  No Known Allergies  VITALS:  Blood pressure (!) 133/55, pulse (!) 59, temperature 98.1 F (36.7 C), resp. rate 19, height 5\' 4"  (1.626 m), weight 59.1 kg (130 lb 6.4 oz), SpO2 99 %.  PHYSICAL EXAMINATION:   Physical Exam  GENERAL:  83 y.o.-year-old patient lying in the bed with no acute distress.  EYES: Pupils equal, round, reactive to light and accommodation. No scleral icterus. Extraocular muscles intact.  HEENT: Head atraumatic, normocephalic. Oropharynx and nasopharynx clear.  NECK:  Supple, no jugular venous distention. No thyroid enlargement, no tenderness.  LUNGS: Normal breath sounds bilaterally, no wheezing, rales, rhonchi. No use of accessory muscles of respiration.  CARDIOVASCULAR: S1, S2 normal. No murmurs, rubs, or gallops.  ABDOMEN: Soft, nontender, nondistended. Bowel sounds present. No organomegaly or mass.  EXTREMITIES: No cyanosis,  clubbing or edema b/l.    NEUROLOGIC: Cranial nerves II through XII are intact. No focal Motor or sensory deficits b/l.   PSYCHIATRIC:  patient is alert and oriented x 3.  SKIN: No obvious rash, lesion, or ulcer.   LABORATORY PANEL:  CBC  Recent Labs Lab 08/29/16 1516  WBC 5.7  HGB 10.2*  HCT 29.9*  PLT 339    Chemistries   Recent Labs Lab 08/29/16 1312  08/30/16 0426  NA 139  --  142  K 2.4*  --  2.5*  CL 107  --  110  CO2 21*  --  26  GLUCOSE 42*  --  42*  BUN 8  --  8  CREATININE 0.97  --  0.98  CALCIUM 8.1*  --  7.7*  MG  --   < > 1.6*  AST 52*  --   --   ALT 20  --   --   ALKPHOS 77  --   --   BILITOT 0.9  --   --   < > = values in this interval not displayed. Cardiac Enzymes  Recent Labs Lab 08/29/16 2051  TROPONINI 0.07*   RADIOLOGY:  Ct Head Wo Contrast  Result Date: 08/29/2016 CLINICAL DATA:  Altered consciousness.  Weakness. EXAM: CT HEAD WITHOUT CONTRAST TECHNIQUE: Contiguous axial images were obtained from the base of the skull through the vertex without intravenous contrast. COMPARISON:  07/12/2016 FINDINGS: Brain: Expected cerebral and cerebellar volume loss for age. Moderate low density in the periventricular white matter likely related to small vessel disease. Remote left parietal cortically based infarct. No mass lesion, hemorrhage, hydrocephalus, acute infarct, intra-axial, or extra-axial fluid collection. Vascular: Intracranial atherosclerosis. Skull: Normal Sinuses/Orbits: Normal imaged portions of the  orbits and globes. Clear paranasal sinuses and mastoid air cells. Other: None. IMPRESSION: 1.  No acute intracranial abnormality. 2.  Cerebral atrophy and small vessel ischemic change. 3. Remote left parietal infarct. Electronically Signed   By: Abigail Miyamoto M.D.   On: 08/29/2016 13:54   Dg Chest Portable 1 View  Result Date: 08/29/2016 CLINICAL DATA:  Weakness. EXAM: PORTABLE CHEST 1 VIEW COMPARISON:  August 21, 2016 FINDINGS: Skin folds over the  left chest with no pneumothorax. Mild bibasilar atelectasis. No other interval changes or acute abnormalities. IMPRESSION: No active disease. Electronically Signed   By: Dorise Bullion III M.D   On: 08/29/2016 13:40   ASSESSMENT AND PLAN:  Sherry [name]  is a 83 y.o. female with a known history of Diabetes mellitus, hypertension, CKD stage III, recurrent UTIs who admits with a walker at home presents to the emergency room after family found her unresponsive. Called EMS and her blood sugars were 38. Patient had spoken with the family member and requested some food. When they arrived patient was unresponsive. She did take her insulin the morning. She was given an amp of D50 and presently is back to normal.   * Syncope due to hypoglycemia from missing her breakfast.  -Patient has mildly elevated troponin likely demand ischemia. We'll also need to rule out arrhythmias due to severe hypokalemia.  * Uncontrolled diabetes with hypoglycemia Sliding scale insulin. Counseled regarding not missing of medications or diet. -Patient currently off all insulin. She takes TRESIBA. Once his sugar stays stable we'll resume it.  * Acute on Chronic hypokalemia. Patient is on 20 mg daily Will increase this to 40 mg daily.  -Continue the lisinopril she is at home. We will replace with oral and IV and repeat in the morning. Magnesium levels normal.  * Recent blood cultures positive with coag-negative methicillin-resistant Staphylococcus aureus. 4 out of 4 bottles positive. But patient has no fever no WBC. Likely contaminant.  -Repeat blood culture so far negative  * DVT prophylaxis with Lovenox  *Physical therapy to see patient  Care management for discharge planning. Patient has been 11 times admitted for various reasons last 6 months.  Case discussed with Care Management/Social Worker. Management plans discussed with the patient, family and they are in agreement.  CODE STATUS: Full  DVT Prophylaxis:   Lovenox TOTAL TIME TAKING CARE OF THIS PATIENT: 30 minutes.  >50% time spent on counselling and coordination of care  POSSIBLE D/C IN 1-2 DAYS, DEPENDING ON CLINICAL CONDITION.  Note: This dictation was prepared with Dragon dictation along with smaller phrase technology. Any transcriptional errors that result from this process are unintentional.  Dakarai Mcglocklin M.D on 08/30/2016 at 2:05 PM  Between 7am to 6pm - Pager - [phone]  After 6pm go to [email or web address] - password [email or web address] [facility]  Office  [phone]  CC: Primary care physician; [name], MD

## 2016-08-30 NOTE — Care Management Obs Status (Signed)
[place] NOTIFICATION   Patient Details  Name: Sherry [name] MRN: [id number] Date of Birth: [birth year]   Medicare Observation Status Notification Given:  Yes  Notice signed, one given to patient and the other to HIM for scanning  The "yes" was not free texted. not sure why the position changed  Katrina Stack, RN 08/30/2016, 4:20 PM

## 2016-08-30 NOTE — Progress Notes (Addendum)
Inpatient Diabetes Program Recommendations  AACE/ADA: New Consensus Statement on Inpatient Glycemic Control (2015)  Target Ranges:  Prepandial:   less than 140 mg/dL      Peak postprandial:   less than 180 mg/dL (1-2 hours)      Critically ill patients:  140 - 180 mg/dL  Results for Sherry [name], Sherry [name] (MRN [id number]) as of 08/30/2016 07:45  Ref. Range 08/29/2016 13:09 08/29/2016 13:54 08/29/2016 21:17 08/30/2016 05:37 08/30/2016 05:58 08/30/2016 07:23  Glucose-Capillary Latest Ref Range: 65 - 99 mg/dL 35 (LL) 139 (H) 159 (H) 35 (LL) 161 (H) 91   Results for Sherry [name], Sherry [name] (MRN [id number]) as of 08/30/2016 07:45  Ref. Range 08/30/2016 04:26  Glucose Latest Ref Range: 65 - 99 mg/dL 42 (LL)  Results for Sherry [name], Sherry [name] (MRN [id number]) as of 08/30/2016 07:45  Ref. Range 07/22/2016 04:48  Hemoglobin A1C Latest Ref Range: 4.8 - 5.6 % 6.9 (H)   Review of Glycemic Control  Diabetes history: DM2 Outpatient Diabetes medications: Tresiba 10 units QAM Current orders for Inpatient glycemic control: Levemir 6 units daily, Novolog 0-9 units TID with meals  Inpatient Diabetes Program Recommendations: Insulin - Basal: Please discontinue Levemir and continue to use Novolog sensitive correction scale if needed. Outpatient DM medications: Patient has [place] and Levemir both on home medication list but reports that she only takes one insulin. Called [facility] and informed patient is only prescribed Tresiba 10 units QAM. Therefore, deleted Levemir of home medication list. Question if patient has hypoglycemia unawareness since patient was not symptomatic with glucose of 35 mg/dl this morning. MD may want to consider adjusting outpatient DM medications at time of discharge.   Addendum 08/30/16@13 :10-Spoke with patient regarding DM and outpatient DM medication regimen. Patient states that she is taking only one insulin for DM control but she is not certain of the name of the insulin or dose. Patient states she uses an insulin pen and  her son dials up the dose for her and she injects the insulin herself every morning. Patient reports that she checks her glucose 2 times per day and her glucose usually ranges from 167-200 mg/dl. Inquired about issues with hypoglycemia and patient denies frequent hypoglycemia. However, in talking with patient she states that she did not have any symptoms this morning when glucose was noted to be 35 mg/dl. Question if patient has hypoglycemia unawareness. Called [facility] and was informed patient is prescribed Tresiba 10 units QAM.   Thanks, Barnie Alderman, RN, MSN, CDE Diabetes Coordinator Inpatient Diabetes Program [phone] (Team Pager from 8am to [place])

## 2016-08-31 DIAGNOSIS — E11649 Type 2 diabetes mellitus with hypoglycemia without coma: Secondary | ICD-10-CM | POA: Diagnosis not present

## 2016-08-31 LAB — POTASSIUM: Potassium: 3.4 mmol/L — ABNORMAL LOW (ref 3.5–5.1)

## 2016-08-31 LAB — GLUCOSE, CAPILLARY
GLUCOSE-CAPILLARY: 134 mg/dL — AB (ref 65–99)
Glucose-Capillary: 117 mg/dL — ABNORMAL HIGH (ref 65–99)

## 2016-08-31 LAB — MAGNESIUM: MAGNESIUM: 2 mg/dL (ref 1.7–2.4)

## 2016-08-31 MED ORDER — POTASSIUM CHLORIDE CRYS ER 20 MEQ PO TBCR
40.0000 meq | EXTENDED_RELEASE_TABLET | Freq: Once | ORAL | Status: AC
  Administered 2016-08-31: 40 meq via ORAL
  Filled 2016-08-31: qty 2

## 2016-08-31 MED ORDER — SODIUM CHLORIDE 0.9% FLUSH: 3.0000 mL | Freq: Two times a day (BID) | INTRAVENOUS | Status: DC

## 2016-08-31 MED ORDER — INSULIN DEGLUDEC 100 UNIT/ML ~~LOC~~ SOPN: 10.0000 [IU] | PEN_INJECTOR | Freq: Every day | SUBCUTANEOUS | 2 refills | Status: DC

## 2016-08-31 NOTE — Discharge Summary (Signed)
[facility] [facility] [place] NAME: Sherry [name]    MR#:  [id number]  DATE OF BIRTH:  05/19/1927  DATE OF ADMISSION:  08/29/2016 ADMITTING PHYSICIAN: Hillary Bow, MD  DATE OF DISCHARGE: 08/31/2016  PRIMARY CARE PHYSICIAN: [name], MD    ADMISSION DIAGNOSIS:  Syncope and collapse [R55] Hypoglycemia [E16.2] Elevated troponin [R74.8]  DISCHARGE DIAGNOSIS:  Syncope due to hypoglycemia Type 2 diabetes  SECONDARY DIAGNOSIS:   Past Medical History:  Diagnosis Date  . Anxiety   . Arthritis   . Chronic kidney disease   . Diabetes mellitus without complication ([place])   . Dizziness   . Dyspnea   . GERD (gastroesophageal reflux disease)   . Hyperlipidemia   . Hypertension   . Pulmonary embolism ([place]) 2016    HOSPITAL COURSE:   InezPooleis a 83 y.o.femalewith a known history of Diabetes mellitus, hypertension, CKD stage III, recurrent UTIs who admits with a walker at home presents to the emergency room after family found her unresponsive. Called EMS and her blood sugars were 38. Patient had spoken with the family member and requested some food. When they arrived patient was unresponsive. She did take her insulin the morning. She was given an amp of D50 and presently is back to normal.   * Syncope due to hypoglycemia from missing her breakfast.  -Patient has mildly elevated troponin likely demand ischemia.  * Uncontrolled diabetes with hypoglycemia Sliding scale insulin. Counseled regarding not missing of medications or diet. -Patient currently off all insulin. She takes TRESIBA.  we'll resume it. -Sugars much better. Patient advised to check sugars 3 times a day and keep log of it. She is also advised to take snack in between meals and made sure she keeps some food close to her to prevent hypoglycemia  * Acute on Chronic hypokalemia -Continue the lisinopril she is at home. -Repleted. Continue oral potassium at home.  *  Recent blood cultures positive with coag-negative methicillin-resistant Staphylococcus aureus. 4 out of 4 bottles positive. But patient has no fever no WBC. Likely contaminant.  -Repeat blood culture so far negative  * DVT prophylaxis with Lovenox  *Physical therapy recommends rehabilitation. Patient according to care management and does not want to go to rehabilitation. We'll arrange home health PT RN and Education officer, museum.  Care management for discharge planning. Patient has been 11 times admitted for various reasons last 6 months.  CONSULTS OBTAINED:    DRUG ALLERGIES:  No Known Allergies  DISCHARGE MEDICATIONS:   Current Discharge Medication List    CONTINUE these medications which have CHANGED   Details  insulin degludec (TRESIBA FLEXTOUCH) 100 UNIT/ML SOPN FlexTouch Pen Inject 0.1 mLs (10 Units total) into the skin daily. Qty: 1 pen, Refills: 2      CONTINUE these medications which have NOT CHANGED   Details  amiodarone (PACERONE) 200 MG tablet Take 1 tablet (200 mg total) by mouth 2 (two) times daily. Change to q daily in 2 weeks Qty: 60 tablet, Refills: 2    amLODipine (NORVASC) 5 MG tablet Take 5 mg by mouth daily.    apixaban (ELIQUIS) 2.5 MG TABS tablet Take 1 tablet (2.5 mg total) by mouth 2 (two) times daily. Qty: 60 tablet, Refills: 2    atorvastatin (LIPITOR) 20 MG tablet Take 20 mg by mouth at bedtime.     docusate sodium (COLACE) 100 MG capsule Take 1 capsule (100 mg total) by mouth 2 (two) times daily. Qty: 10 capsule,  Refills: 0    ferrous sulfate 325 (65 FE) MG tablet Take 1 tablet (325 mg total) by mouth daily with breakfast. Qty: 60 tablet, Refills: 3    gabapentin (NEURONTIN) 300 MG capsule Take 300 mg by mouth at bedtime.     lisinopril (PRINIVIL,ZESTRIL) 20 MG tablet Take 1 tablet (20 mg total) by mouth daily. Qty: 30 tablet, Refills: 2    magnesium oxide (MAG-OX) 400 (241.3 Mg) MG tablet Take 1 tablet (400 mg total) by mouth daily. Qty: 30  tablet, Refills: 0    megestrol (MEGACE) 40 MG tablet Take 1 tablet (40 mg total) by mouth daily. Qty: 30 tablet, Refills: 2    meloxicam (MOBIC) 7.5 MG tablet Take 7.5 mg by mouth daily.    metoCLOPramide (REGLAN) 5 MG tablet Take 1 tablet (5 mg total) by mouth 2 (two) times daily. Qty: 60 tablet, Refills: 2    metoprolol tartrate (LOPRESSOR) 25 MG tablet Take 1 tablet (25 mg total) by mouth 2 (two) times daily. Qty: 60 tablet, Refills: 2    Multiple Vitamin (MULTIVITAMIN WITH MINERALS) TABS tablet Take 1 tablet by mouth daily. Qty: 30 tablet, Refills: 2    ondansetron (ZOFRAN-ODT) 4 MG disintegrating tablet Take 4 mg by mouth every 6 (six) hours as needed for nausea or vomiting.    oxyCODONE-acetaminophen (PERCOCET) 7.5-325 MG tablet Take 1 tablet by mouth 2 (two) times daily as needed for severe pain. Refills: 0    pantoprazole (PROTONIX) 40 MG tablet Take 1 tablet (40 mg total) by mouth 2 (two) times daily. Qty: 60 tablet, Refills: 0    polyethylene glycol (MIRALAX) packet Take 17 g by mouth daily. Qty: 30 each, Refills: 0    Potassium Chloride ER 20 MEQ TBCR Take 1 tablet by mouth daily.    sertraline (ZOLOFT) 25 MG tablet Take 1 tablet (25 mg total) by mouth daily. Qty: 30 tablet, Refills: 2    sucralfate (CARAFATE) 1 GM/10ML suspension Take 10 mLs (1 g total) by mouth 4 (four) times daily -  with meals and at bedtime. Qty: 420 mL, Refills: 0    tamsulosin (FLOMAX) 0.4 MG CAPS capsule Take 1 capsule (0.4 mg total) by mouth daily. Qty: 30 capsule, Refills: 0        If you experience worsening of your admission symptoms, develop shortness of breath, life threatening emergency, suicidal or homicidal thoughts you must seek medical attention immediately by calling 911 or calling your MD immediately  if symptoms less severe.  You Must read complete instructions/literature along with all the possible adverse reactions/side effects for all the Medicines you take and that have  been prescribed to you. Take any new Medicines after you have completely understood and accept all the possible adverse reactions/side effects.   Please note  You were cared for by a hospitalist during your hospital stay. If you have any questions about your discharge medications or the care you received while you were in the hospital after you are discharged, you can call the unit and asked to speak with the hospitalist on call if the hospitalist that took care of you is not available. Once you are discharged, your primary care physician will handle any further medical issues. Please note that NO REFILLS for any discharge medications will be authorized once you are discharged, as it is imperative that you return to your primary care physician (or establish a relationship with a primary care physician if you do not have one) for your aftercare needs so  that they can reassess your need for medications and monitor your lab values. Today   SUBJECTIVE   No new complaints eating well Sugar stable  VITAL SIGNS:  Blood pressure (!) 131/38, pulse 62, temperature 98.3 F (36.8 C), temperature source Oral, resp. rate 18, height 5\' 4"  (1.626 m), weight 58.2 kg (128 lb 4.8 oz), SpO2 97 %.  I/O:   Intake/Output Summary (Last 24 hours) at 08/31/16 1150 Last data filed at 08/31/16 1017  Gross per 24 hour  Intake              480 ml  Output              151 ml  Net              329 ml    PHYSICAL EXAMINATION:  GENERAL:  83 y.o.-year-old patient lying in the bed with no acute distress.  EYES: Pupils equal, round, reactive to light and accommodation. No scleral icterus. Extraocular muscles intact.  HEENT: Head atraumatic, normocephalic. Oropharynx and nasopharynx clear.  NECK:  Supple, no jugular venous distention. No thyroid enlargement, no tenderness.  LUNGS: Normal breath sounds bilaterally, no wheezing, rales,rhonchi or crepitation. No use of accessory muscles of respiration.  CARDIOVASCULAR: S1,  S2 normal. No murmurs, rubs, or gallops.  ABDOMEN: Soft, non-tender, non-distended. Bowel sounds present. No organomegaly or mass.  EXTREMITIES: No pedal edema, cyanosis, or clubbing.  NEUROLOGIC: Cranial nerves II through XII are intact. Muscle strength 5/5 in all extremities. Sensation intact. Gait not checked.  PSYCHIATRIC: The patient is alert and oriented x 3.  SKIN: No obvious rash, lesion, or ulcer.   DATA REVIEW:   CBC   Recent Labs Lab 08/29/16 1516  WBC 5.7  HGB 10.2*  HCT 29.9*  PLT 339    Chemistries   Recent Labs Lab 08/29/16 1312  08/30/16 0426  08/31/16 0453  NA 139  --  142  --   --   K 2.4*  --  2.5*  < > 3.4*  CL 107  --  110  --   --   CO2 21*  --  26  --   --   GLUCOSE 42*  --  42*  --   --   BUN 8  --  8  --   --   CREATININE 0.97  --  0.98  --   --   CALCIUM 8.1*  --  7.7*  --   --   MG  --   < > 1.6*  --  2.0  AST 52*  --   --   --   --   ALT 20  --   --   --   --   ALKPHOS 77  --   --   --   --   BILITOT 0.9  --   --   --   --   < > = values in this interval not displayed.  Microbiology Results   Recent Results (from the past 240 hour(s))  CULTURE, BLOOD (ROUTINE X 2) w Reflex to ID Panel     Status: None (Preliminary result)   Collection Time: 08/29/16  4:56 PM  Result Value Ref Range Status   Specimen Description BLOOD LEFT ANTECUBITAL  Final   Special Requests   Final    BOTTLES DRAWN AEROBIC AND ANAEROBIC Blood Culture results may not be optimal due to an inadequate volume of blood received in culture bottles   Culture NO GROWTH  2 DAYS  Final   Report Status PENDING  Incomplete  CULTURE, BLOOD (ROUTINE X 2) w Reflex to ID Panel     Status: None (Preliminary result)   Collection Time: 08/30/16  4:26 AM  Result Value Ref Range Status   Specimen Description BLOOD RIGHT WRIST  Final   Special Requests   Final    BOTTLES DRAWN AEROBIC AND ANAEROBIC Blood Culture results may not be optimal due to an inadequate volume of blood received in  culture bottles   Culture NO GROWTH 1 DAY  Final   Report Status PENDING  Incomplete    RADIOLOGY:  Ct Head Wo Contrast  Result Date: 08/29/2016 CLINICAL DATA:  Altered consciousness.  Weakness. EXAM: CT HEAD WITHOUT CONTRAST TECHNIQUE: Contiguous axial images were obtained from the base of the skull through the vertex without intravenous contrast. COMPARISON:  07/12/2016 FINDINGS: Brain: Expected cerebral and cerebellar volume loss for age. Moderate low density in the periventricular white matter likely related to small vessel disease. Remote left parietal cortically based infarct. No mass lesion, hemorrhage, hydrocephalus, acute infarct, intra-axial, or extra-axial fluid collection. Vascular: Intracranial atherosclerosis. Skull: Normal Sinuses/Orbits: Normal imaged portions of the orbits and globes. Clear paranasal sinuses and mastoid air cells. Other: None. IMPRESSION: 1.  No acute intracranial abnormality. 2.  Cerebral atrophy and small vessel ischemic change. 3. Remote left parietal infarct. Electronically Signed   By: Abigail Miyamoto M.D.   On: 08/29/2016 13:54   Dg Chest Portable 1 View  Result Date: 08/29/2016 CLINICAL DATA:  Weakness. EXAM: PORTABLE CHEST 1 VIEW COMPARISON:  August 21, 2016 FINDINGS: Skin folds over the left chest with no pneumothorax. Mild bibasilar atelectasis. No other interval changes or acute abnormalities. IMPRESSION: No active disease. Electronically Signed   By: Dorise Bullion III M.D   On: 08/29/2016 13:40     Management plans discussed with the patient, family and they are in agreement.  CODE STATUS:     Code Status Orders        Start     Ordered   08/29/16 1643  Full code  Continuous     08/29/16 1644    Code Status History    Date Active Date Inactive Code Status Order ID Comments User Context   08/21/2016  6:15 AM 08/22/2016  7:28 PM Full Code [id number]  Lance Coon, MD Inpatient   07/28/2016  9:06 PM 08/02/2016  8:04 PM Full Code [id number]  Lance Coon, MD Inpatient   07/22/2016  4:13 AM 07/24/2016  5:43 PM Full Code [id number]  Harrie Foreman, MD Inpatient   07/12/2016 10:45 PM 07/18/2016  8:32 PM Full Code [id number]  Hugelmeyer, Alexis, DO Inpatient   05/28/2016 12:01 PM 06/02/2016  7:32 PM Full Code [id number]  Lorella Nimrod, MD Inpatient   04/24/2016  7:27 PM 04/27/2016  8:08 PM Full Code [id number]  Loletha Grayer, MD ED   04/19/2016 12:50 AM 04/22/2016 12:10 AM Full Code [id number]  Lance Coon, MD Inpatient   03/27/2016  9:39 PM 03/29/2016  9:41 PM Full Code [id number]  Fritzi Mandes, MD ED   03/12/2016 12:21 PM 03/13/2016  8:32 PM Full Code [id number]  Dustin Flock, MD Inpatient   03/09/2016  4:33 PM 03/12/2016 12:21 PM DNR [id number]  Nicholes Mango, MD ED   02/26/2016  9:39 PM 03/01/2016 10:37 PM DNR [id number]  Karmen Bongo, MD Inpatient   02/26/2016  7:48 PM 02/26/2016  9:39 PM DNR [id number]  Dorie Rank, MD ED   01/31/2016  5:45  PM 02/07/2016 11:45 PM DNR [id number]  Flora Lipps, MD Inpatient   01/30/2016 11:19 PM 01/31/2016  5:44 PM Full Code [id number]  Harvie Bridge, DO Inpatient   11/26/2014  3:52 PM 11/28/2014  3:07 PM Full Code [id number]  Max Sane, MD Inpatient   11/26/2014  8:59 AM 11/26/2014  3:52 PM Full Code [id number]  Max Sane, MD ED    Advance Directive Documentation     Most Recent Value  Type of Advance Directive  Healthcare Power of Attorney  Pre-existing out of facility DNR order (yellow form or pink MOST form)  -  "MOST" Form in Place?  -      TOTAL TIME TAKING CARE OF THIS PATIENT: 40 minutes.    Dollene Mallery M.D on 08/31/2016 at 11:50 AM  Between 7am to 6pm - Pager - [phone] After 6pm go to [email or web address] - password [email or web address] [facility]  Office  [phone]  CC: Primary care physician; [name], MD

## 2016-08-31 NOTE — Progress Notes (Signed)
Adult Protective Services (APS) worker Kennyth Arnold came to visit patient and her son [name] today at bedside prior to D/C today. Patient discharged home today.   [employer], LCSW [phone]

## 2016-08-31 NOTE — Progress Notes (Signed)
07/23/16 1300  PT Visit Information  Last PT Received On 07/23/16  Assistance Needed +1  History of Present Illness 83 yo female with fib and RVR was referred to PT, noted chest pain and weakness.  Has had V tach with no cardioversion needed, and medicated to control A-fib.  Repleted K+, stabilized to admit after recent SNF stay.  PMHx: DM, HLD, PE, HTN, a-fib  Precautions  Precautions Fall (telemetry)  Precaution Comments monitor HR  Restrictions  Weight Bearing Restrictions No  Home Living  Family/patient expects to be discharged to: Private residence  Living Arrangements Children (lives with her son)  Available Help at Discharge Family;Available 24 hours/day  Type of Home Apartment  Home Access Level entry  Home Layout One level  Tax adviser - 2 wheels;Hospital bed;Shower seat;Wheelchair - manual;BSC  Additional Comments had [facility] admission [facility]  Prior Function  Level of [place] with assistive device(s)  Comments Had been on RW and has son helping care for house  Communication  Communication HOH  Pain Assessment  Pain Assessment No/denies pain  Cognition  Arousal/Alertness Awake/alert  Behavior During Therapy WFL for tasks assessed/performed  Overall Cognitive Status Within Functional Limits for tasks assessed  General Comments Pt is controlling walker better, following instructions   Upper Extremity Assessment  Upper Extremity Assessment Overall WFL for tasks assessed  Lower Extremity Assessment  Lower Extremity Assessment Generalized weakness  Cervical / Trunk Assessment  Cervical / Trunk Assessment Kyphotic  Bed Mobility  Overal bed mobility Needs Assistance  Bed Mobility Supine to Sit;Sit to Supine  Supine to sit Min assist  Sit to supine Min assist  General bed mobility comments minimal trunk support to sit up and return to bed  Transfers  Overall transfer level Needs assistance  Equipment  used Rolling walker (2 wheeled);1 person hand held assist  Transfers Sit to/from Stand  Sit to Stand Min guard  General transfer comment no outright LOB but fatiguing with the effort to get up to walk  Ambulation/Gait  Ambulation/Gait assistance Min guard  Ambulation Distance (Feet) 80 Feet  Assistive device Rolling walker (2 wheeled)  Gait Pattern/deviations Step-through pattern;Narrow base of support;Trunk flexed;Decreased stride length  General Gait Details had pt wear pulse oximeter to walk and noted her pulse read 191 on portable, but was 131 when she returned to room and checked telemetry  Gait velocity reduced  Gait velocity interpretation Below normal speed for age/gender  Balance  Overall balance assessment Needs assistance  Sitting-balance support Feet supported  Sitting balance-Leahy Scale Good  Standing balance support Bilateral upper extremity supported  Standing balance-Leahy Scale Fair  PT - End of Session  Equipment Utilized During Treatment Gait belt  Activity Tolerance Patient limited by fatigue;Patient tolerated treatment well (pulses very elevated with effort)  Patient left with call bell/phone within reach;in bed  Nurse Communication Mobility status  PT Assessment  PT Recommendation/Assessment Patient needs continued PT services  PT Visit Diagnosis Muscle weakness (generalized) (M62.81);Difficulty in walking, not elsewhere classified (R26.2)  PT Problem List Decreased strength;Decreased range of motion;Decreased activity tolerance;Decreased balance;Decreased mobility;Cardiopulmonary status limiting activity;Decreased coordination  Barriers to Discharge Comments son is available to assist pt at all times  PT Plan  PT Frequency (ACUTE ONLY) Min 2X/week  PT Treatment/Interventions (ACUTE ONLY) DME instruction;Gait training;Functional mobility training;Therapeutic activities;Therapeutic exercise;Balance training;Neuromuscular re-education;Patient/family education   AM-PAC PT "6 Clicks" Daily Activity Outcome Measure  Difficulty turning over in bed (including adjusting bedclothes, sheets and  blankets)? 4  Difficulty moving from lying on back to sitting on the side of the bed?  3  Difficulty sitting down on and standing up from a chair with arms (e.g., wheelchair, bedside commode, etc,.)? 3  Help needed moving to and from a bed to chair (including a wheelchair)? 3  Help needed walking in hospital room? 3  Help needed climbing 3-5 steps with a railing?  3  6 Click Score 19  Mobility G Code  CJ  PT Recommendation  Follow Up Recommendations Home health PT  PT equipment None recommended by PT  Individuals Consulted  Consulted and Agree with Results and Recommendations Patient  Acute Rehab PT Goals  Patient Stated Goal to get stronger and get home  PT Goal Formulation With patient  Time For Goal Achievement 07/30/16  Potential to Achieve Goals Good  PT Time Calculation  PT Start Time (ACUTE ONLY) 0915  PT Stop Time (ACUTE ONLY) 0940  PT Time Calculation (min) (ACUTE ONLY) 25 min  PT G-Codes **NOT FOR INPATIENT CLASS**  Functional Assessment Tool Used AM-PAC 6 Clicks Basic Mobility  Functional Limitation Mobility: Walking and moving around  Mobility: Walking and Moving Around Current Status (Q3009) CJ  Mobility: Walking and Moving Around Goal Status (Q3300) CI  PT General Charges  $$ ACUTE PT VISIT 1 Procedure  PT Evaluation  $PT Eval Low Complexity 1 Procedure  PT Treatments  $Gait Training 8-22 mins  Written Expression  Dominant Hand Right    Late entry g-codes added after review of initial evaluation/documentation by Mee Hives, PT.  Reyes Ivan. Owens Shark, PT, DPT, NCS 08/31/16, 8:59 AM [phone]

## 2016-08-31 NOTE — Care Management (Signed)
[place] to obtain status of medicaid pcs referral.  Referral was received 08/10/2016 and attempted in home assessment on 8/7 and was informed by patient and her son that services were not needed.  Informed Adonis Brook with [facility] and was relayed that home health staff had expressed concerns about the amount of time patient was spending alone and and APS referral was in progress. CSW at [facility] also mad a report. Notified [place] of status of medicaid pcs referral and of discharge today.

## 2016-08-31 NOTE — Clinical Social Work Note (Signed)
Clinical Social Work Assessment  Patient Details  Name: Sherry [name] MRN: [id number] Date of Birth: 11-Nov-1927  Date of referral:  08/31/16               Reason for consult:  Abuse/Neglect, Facility Placement                Permission sought to share information with:    Permission granted to share information::     Name::        Agency::     Relationship::     Contact Information:     Housing/Transportation Living arrangements for the past 2 months:  Single Family Home Source of Information:  Patient Patient Interpreter Needed:  None Criminal Activity/Legal Involvement Pertinent to Current Situation/Hospitalization:  No - Comment as needed Significant Relationships:  Adult Children Lives with:  Adult Children Do you feel safe going back to the place where you live?  Yes Need for family participation in patient care:  Yes (Comment)  Care giving concerns:  Patient lives with her son Sherry [name] in [place].    Social Worker assessment / plan:  Holiday representative ([place]) reviewed chart and noted that PT is recommending SNF and patient is discharging home today. CSW and social work Theatre manager met with patient alone at bedside to discuss D/C plan. Patient was alert and oriented X4 and was sitting up in the chair at bedside. CSW introduced self and explained role of CSW department. Patient reported that she lives with her son Sherry [name] and he takes care of her. Patient reported that her daughter also checks on her. CSW explained that PT is recommending SNF and the benefits of SNF. Patient adamantly refused SNF and stated that she is going home. Per RN case International aid/development worker home health is following patient at home and has made an Adult [employer] (APS) report due to concerns about patient not getting enough food and being left alone for a long time during the day. CSW made an APS report to [facility] today. RN case manager aware of above. Please reconsult if future social work needs arise. CSW  signing off.   Employment status:  Disabled (Comment on whether or not currently receiving Disability) Insurance information:  Managed Medicare PT Recommendations:  [place] / Referral to community resources:  [place]  Patient/Family's Response to care:  Patient refused SNF and stated that she is going home today.   Patient/Family's Understanding of and Emotional Response to Diagnosis, Current Treatment, and Prognosis:  Patient was pleasant and reported no concerns at home.   Emotional Assessment Appearance:  Appears stated age Attitude/Demeanor/Rapport:    Affect (typically observed):  Pleasant Orientation:  Oriented to Self, Oriented to Place, Oriented to  Time, Oriented to Situation Alcohol / Substance use:  Not Applicable Psych involvement (Current and /or in the community):  No (Comment)  Discharge Needs  Concerns to be addressed:  Discharge Planning Concerns Readmission within the last 30 days:  Yes Current discharge risk:  Dependent with Mobility Barriers to Discharge:  No Barriers Identified   Mabrey Howland, Veronia Beets, LCSW 08/31/2016, 2:55 PM

## 2016-08-31 NOTE — NC FL2 (Signed)
[place] LEVEL OF CARE SCREENING TOOL     IDENTIFICATION  Patient Name: Sherry [name] Birthdate: 10-01-1927 Sex: female Admission Date (Current Location): 08/29/2016  [place] and [place] Number:  Selena Lesser  ([id number]) Facility and Address:  [facility], [address] [place], [place] [address]      Provider Number: [id number]  Attending Physician Name and Address:  Fritzi Mandes, MD  Relative Name and Phone Number:       Current Level of Care: Hospital Recommended Level of Care: [place] Prior Approval Number:    Date Approved/Denied:   PASRR Number:  ([id number] )  Discharge Plan: SNF    Current Diagnoses: Patient Active Problem List   Diagnosis Date Noted  . Syncope 08/29/2016  . Chronic idiopathic constipation   . HLD (hyperlipidemia) 07/28/2016  . Fecal impaction of colon ([place]) 07/28/2016  . Fecal impaction ([place]) 07/28/2016  . Atrial fibrillation with RVR ([place]) 07/22/2016  . Dehydration   . Renal mass   . Chest pain, rule out acute myocardial infarction 07/12/2016  . Reactive depression   . Esophagitis determined by endoscopy 05/31/2016  . Hyperglycemia   . Hypokalemia   . Cystitis   . HH (hiatus hernia)   . Hiatal hernia with gastroesophageal reflux disease without esophagitis   . GI bleed 05/28/2016  . Upper GI bleeding 05/28/2016  . Pressure injury of skin 05/28/2016  . Atrial flutter ([place])   . Gastroenteritis 04/24/2016  . Aphasia 04/18/2016  . Nausea & vomiting 03/27/2016  . Advance care planning   . UTI (urinary tract infection) 03/12/2016  . Chest pain 03/09/2016  . PE (pulmonary thromboembolism) ([place]) 02/26/2016  . Diabetes mellitus type 2, insulin dependent ([place]) 02/26/2016  . Anemia 02/26/2016  . Chronic kidney disease (CKD), stage III (moderate) 02/26/2016  . Essential hypertension 02/26/2016  . Goals of care, counseling/discussion   . Adult failure to thrive syndrome   .  Palliative care by specialist   . Sepsis ([place]) 11/26/2014    Orientation RESPIRATION BLADDER Height & Weight     Self, Time, Situation, Place  Normal Incontinent Weight: 128 lb 4.8 oz (58.2 kg) Height:  5\' 4"  (162.6 cm)  BEHAVIORAL SYMPTOMS/MOOD NEUROLOGICAL BOWEL NUTRITION STATUS   (none)   Incontinent Diet (Diet: Low Sodium/ Heart Healthy )  AMBULATORY STATUS COMMUNICATION OF NEEDS Skin   Extensive Assist Verbally PU Stage and Appropriate Care (pressure ulcer stage 2 on sacrum. )                       Personal Care Assistance Level of Assistance  Bathing, Feeding, Dressing Bathing Assistance: Limited assistance Feeding assistance: Independent Dressing Assistance: Limited assistance     Functional Limitations Info  Sight, Hearing, Speech Sight Info: Adequate Hearing Info: Adequate Speech Info: Adequate    SPECIAL CARE FACTORS FREQUENCY  PT (By licensed PT), OT (By licensed OT)     PT Frequency:  (5) OT Frequency:  (5)            Contractures      Additional Factors Info  Code Status, Allergies Code Status Info:  (Full Code. ) Allergies Info:  (No Known Allergies. )           Current Medications (08/31/2016):  This is the current hospital active medication list Current Facility-Administered Medications  Medication Dose Route Frequency Provider Last Rate Last Dose  . acetaminophen (TYLENOL) tablet 650 mg  650 mg Oral Q6H PRN Sudini,  Srikar, MD       Or  . acetaminophen (TYLENOL) suppository 650 mg  650 mg Rectal Q6H PRN Sudini, Alveta Heimlich, MD      . albuterol (PROVENTIL) (2.5 MG/3ML) 0.083% nebulizer solution 2.5 mg  2.5 mg Nebulization Q2H PRN Sudini, Srikar, MD      . amiodarone (PACERONE) tablet 200 mg  200 mg Oral Daily Hillary Bow, MD   200 mg at 08/31/16 0925  . apixaban (ELIQUIS) tablet 2.5 mg  2.5 mg Oral BID Hillary Bow, MD   2.5 mg at 08/31/16 0925  . atorvastatin (LIPITOR) tablet 20 mg  20 mg Oral QHS Hillary Bow, MD   20 mg at 08/30/16  2048  . docusate sodium (COLACE) capsule 100 mg  100 mg Oral BID Hillary Bow, MD   100 mg at 08/31/16 0924  . gabapentin (NEURONTIN) capsule 300 mg  300 mg Oral QHS Hillary Bow, MD   300 mg at 08/30/16 2046  . insulin aspart (novoLOG) injection 0-9 Units  0-9 Units Subcutaneous TID WC Hillary Bow, MD   1 Units at 08/31/16 [phone]  . lisinopril (PRINIVIL,ZESTRIL) tablet 20 mg  20 mg Oral Daily Hillary Bow, MD   20 mg at 08/31/16 0924  . magnesium oxide (MAG-OX) tablet 400 mg  400 mg Oral Daily Hillary Bow, MD   400 mg at 08/31/16 0924  . megestrol (MEGACE) tablet 40 mg  40 mg Oral Daily Hillary Bow, MD   40 mg at 08/31/16 0930  . meloxicam (MOBIC) tablet 7.5 mg  7.5 mg Oral Daily Hillary Bow, MD   7.5 mg at 08/31/16 0925  . metoCLOPramide (REGLAN) tablet 5 mg  5 mg Oral BID Hillary Bow, MD   5 mg at 08/31/16 0924  . metoprolol tartrate (LOPRESSOR) tablet 25 mg  25 mg Oral BID Hillary Bow, MD   25 mg at 08/31/16 0924  . ondansetron (ZOFRAN) tablet 4 mg  4 mg Oral Q6H PRN Hillary Bow, MD       Or  . ondansetron (ZOFRAN) injection 4 mg  4 mg Intravenous Q6H PRN Sudini, Srikar, MD      . pantoprazole (PROTONIX) EC tablet 40 mg  40 mg Oral BID Hillary Bow, MD   40 mg at 08/31/16 0925  . polyethylene glycol (MIRALAX / GLYCOLAX) packet 17 g  17 g Oral Daily PRN Sudini, Srikar, MD      . polyethylene glycol (MIRALAX / GLYCOLAX) packet 17 g  17 g Oral Daily Hillary Bow, MD   17 g at 08/31/16 0924  . sertraline (ZOLOFT) tablet 25 mg  25 mg Oral Daily Hillary Bow, MD   25 mg at 08/31/16 0925  . sodium chloride flush (NS) 0.9 % injection 3 mL  3 mL Intravenous Q12H Sudini, Srikar, MD   3 mL at 08/31/16 0930  . sodium chloride flush (NS) 0.9 % injection 3 mL  3 mL Intravenous Q12H Fritzi Mandes, MD      . sucralfate (CARAFATE) 1 GM/10ML suspension 1 g  1 g Oral TID WC & HS Hillary Bow, MD   1 g at 08/31/16 1311  . tamsulosin (FLOMAX) capsule 0.4 mg  0.4 mg Oral Daily  Hillary Bow, MD   0.4 mg at 08/31/16 4431     Discharge Medications: Please see discharge summary for a list of discharge medications.  Relevant Imaging Results:  Relevant Lab Results:   Additional Information  (SSN: [id number])  Taliya Mcclard, Veronia Beets, LCSW

## 2016-08-31 NOTE — Consult Note (Signed)
[place] NOTE  Pharmacy Consult for electrolytes Indication: hypokalemia  No Known Allergies  Patient Measurements: Height: 5\' 4"  (162.6 cm) Weight: 128 lb 4.8 oz (58.2 kg) IBW/kg (Calculated) : 54.7   Vital Signs: Temp: 98.1 F (36.7 C) (08/23 0542) Temp Source: Oral (08/23 0542) BP: 119/40 (08/23 0542) Pulse Rate: 54 (08/23 0542) Intake/Output from previous day: 08/22 0701 - 08/23 0700 In: 720 [P.O.:720] Out: 351 [Urine:350; Stool:1] Intake/Output from this shift: No intake/output data recorded.  Labs:  Recent Labs  08/29/16 1312 08/29/16 1516 08/30/16 0426 08/31/16 0453  WBC  --  5.7  --   --   HGB  --  10.2*  --   --   HCT  --  29.9*  --   --   PLT  --  339  --   --   CREATININE 0.97  --  0.98  --   MG  --  1.8 1.6* 2.0  ALBUMIN 2.6*  --   --   --   PROT 5.4*  --   --   --   AST 52*  --   --   --   ALT 20  --   --   --   ALKPHOS 77  --   --   --   BILITOT 0.9  --   --   --    Estimated Creatinine Clearance: 35.6 mL/min (by C-G formula based on SCr of 0.98 mg/dL).   Assessment: Patient is a 82 year old female who was found by family unresponsive. Pt had a BG of 38 when EMS arrived. K was 2.4. MG this AM is 2.   Goal of Therapy:  Normalization of electrolytes  Plan:  K = 3.4 this AM. Supplement with KCl 40 mEq PO x 1. Will recheck electrolytes later today.   [place] Resident  08/31/2016,7:13 AM

## 2016-08-31 NOTE — Evaluation (Signed)
Physical Therapy Evaluation Patient Details Name: Sherry [name] MRN: [id number] DOB: [birth year] Today's Date: 08/31/2016   History of Present Illness  Pt is an 83 yo F admitted to acute care on 8/21 for syncope. Prior to admission pt reports needing assist for meal prep but being ModI for all other ADL's, amb with RW. PMH: anxiety, arthritis, CKD, diabetes mellitus, dizziness, dyspnea, GERD, HLD, HTN, and PE.   Clinical Impression  Pt pleasant and willing to participate for return to PLOF. Pt was unaware of limitations, particularly in balance. Pt performs bed mobility with CGA, and tranfers and ambulation with MinA, due to impaired strength, endurance and balance. Pt with poor safety awareness, but demos good carryover with cuing. Pt amb total of 5 ft with RW, but was very unsteady and reported RPE of 8/10. Overall, pt responded well to today's treatment with no adverse affects. Pt would benefit from skilled PT to address the previously mentioned impairments and promote return to PLOF. Currently recommending SNF, pending d/c. This entire session was guided, instructed, and directly supervised by Greggory Stallion, DPT.     Follow Up Recommendations SNF    Equipment Recommendations  None recommended by PT    Recommendations for Other Services       Precautions / Restrictions Precautions Precautions: Fall Restrictions Weight Bearing Restrictions: No      Mobility  Bed Mobility Overal bed mobility: Needs Assistance Bed Mobility: Supine to Sit     Supine to sit: Min guard     General bed mobility comments: CGA, requiring no physical assist to come to sit, but with increased time and increased postural sway upon sitting   Transfers Overall transfer level: Needs assistance Equipment used: Rolling walker (2 wheeled) Transfers: Sit to/from Stand Sit to Stand: Min assist         General transfer comment: Pt with minA with sit to stand, as she has tendency to lose balance posteriorly  with initial standing. Pt with min cues on safety with coming STS and stadn to sit.   Ambulation/Gait Ambulation/Gait assistance: Min assist Ambulation Distance (Feet): 5 Feet Assistive device: Rolling walker (2 wheeled) Gait Pattern/deviations: Shuffle     General Gait Details: Pt with unsteady gait patter, requiring minA to self correct adn prevent loss of balance. Pt with poor spacial awareness, requirin g cues to safely navigate from bed to chair. Pt with good carryover, but mild reports of dizziness and SOB upon sittin gin chair. Pt with RPE of 8/10 with amb  Stairs            Wheelchair Mobility    Modified Rankin (Stroke Patients Only)       Balance Overall balance assessment: Needs assistance   Sitting balance-Leahy Scale: Fair Sitting balance - Comments: Pt with fair sitting balance, requiring CGA. Pt initially with good sitting balance, but with increased postural sway, requiring verbal and tactile cues to refrain from falling.    Standing balance support: Bilateral upper extremity supported Standing balance-Leahy Scale: Fair Standing balance comment: Fair stadning balance, requiring UE support and minA from therapist to corect increased postural sway an dprevent LOB. Pt with slight dizziness with standing and lack of safety awareness.                              Pertinent Vitals/Pain Pain Assessment: No/denies pain    Home Living Family/patient expects to be discharged to:: Private residence Living Arrangements: Children Available Help  at Discharge: Family;Available PRN/intermittently (Son works) Type of Home: House Home Access: Stairs to enter Entrance Stairs-Rails: None ("my son gets me insideGarment/textile technologist of Steps: 2 Home Layout: One level        Prior Function Level of Independence: Needs assistance         Comments: Per pt, needs assist from son with meal prep, but no other ADL's. Pt amb with RW     Hand Dominance    Dominant Hand: Right    Extremity/Trunk Assessment   Upper Extremity Assessment Upper Extremity Assessment: Overall WFL for tasks assessed    Lower Extremity Assessment Lower Extremity Assessment: Generalized weakness (MMT to B LE's grossly 4/5)       Communication   Communication: HOH  Cognition Arousal/Alertness: Awake/alert Behavior During Therapy: WFL for tasks assessed/performed Overall Cognitive Status: Within Functional Limits for tasks assessed                                        General Comments      Exercises Other Exercises Other Exercises: Supine and seated therex performed to B LE's x10 reps: ankle pumps, glute sets, hip abd, and SLR. Pt with good caryover, following cues well.    Assessment/Plan    PT Assessment Patient needs continued PT services  PT Problem List Decreased strength;Decreased balance;Decreased activity tolerance;Decreased mobility;Decreased coordination;Decreased safety awareness       PT Treatment Interventions Gait training;DME instruction;Functional mobility training;Balance training;Neuromuscular re-education;Therapeutic exercise;Therapeutic activities;Patient/family education;Stair training;Manual techniques    PT Goals (Current goals can be found in the Care Plan section)  Acute Rehab PT Goals Patient Stated Goal: Return to prior level of function PT Goal Formulation: With patient Time For Goal Achievement: 09/14/16 Potential to Achieve Goals: Good    Frequency Min 2X/week   Barriers to discharge        Co-evaluation               AM-PAC PT "6 Clicks" Daily Activity  Outcome Measure Difficulty turning over in bed (including adjusting bedclothes, sheets and blankets)?: Unable Difficulty moving from lying on back to sitting on the side of the bed? : Unable Difficulty sitting down on and standing up from a chair with arms (e.g., wheelchair, bedside commode, etc,.)?: Unable Help needed moving to and  from a bed to chair (including a wheelchair)?: A Little Help needed walking in hospital room?: A Little Help needed climbing 3-5 steps with a railing? : Total 6 Click Score: 10    End of Session Equipment Utilized During Treatment: Gait belt Activity Tolerance: Patient tolerated treatment well Patient left: in chair;with call bell/phone within reach;with chair alarm set;with nursing/sitter in room Nurse Communication: Mobility status PT Visit Diagnosis: Unsteadiness on feet (R26.81);Other abnormalities of gait and mobility (R26.89);Muscle weakness (generalized) (M62.81)    Time: 1103-(P) 1125 PT Time Calculation (min) (ACUTE ONLY): (P) 22 min   Charges:         PT G Codes:       Oran Rein PT, SPT   Bevelyn Ngo 08/31/2016, 12:22 PM

## 2016-09-03 LAB — CULTURE, BLOOD (ROUTINE X 2): Culture: NO GROWTH

## 2016-09-04 LAB — CULTURE, BLOOD (ROUTINE X 2): Culture: NO GROWTH

## 2016-09-05 ENCOUNTER — Encounter: Payer: Self-pay | Admitting: Emergency Medicine

## 2016-09-05 ENCOUNTER — Emergency Department: Payer: Medicare Other

## 2016-09-05 ENCOUNTER — Inpatient Hospital Stay
Admission: EM | Admit: 2016-09-05 | Discharge: 2016-09-07 | DRG: 637 | Disposition: A | Discharge status: Home Health | Payer: Medicare Other | Attending: Internal Medicine | Admitting: Internal Medicine

## 2016-09-05 DIAGNOSIS — T68XXXA Hypothermia, initial encounter: Secondary | ICD-10-CM

## 2016-09-05 DIAGNOSIS — G9340 Encephalopathy, unspecified: Secondary | ICD-10-CM | POA: Diagnosis present

## 2016-09-05 DIAGNOSIS — E11649 Type 2 diabetes mellitus with hypoglycemia without coma: Secondary | ICD-10-CM | POA: Diagnosis present

## 2016-09-05 DIAGNOSIS — E162 Hypoglycemia, unspecified: Secondary | ICD-10-CM

## 2016-09-05 DIAGNOSIS — I130 Hypertensive heart and chronic kidney disease with heart failure and stage 1 through stage 4 chronic kidney disease, or unspecified chronic kidney disease: Secondary | ICD-10-CM | POA: Diagnosis present

## 2016-09-05 DIAGNOSIS — R680 Hypothermia, not associated with low environmental temperature: Secondary | ICD-10-CM | POA: Diagnosis present

## 2016-09-05 DIAGNOSIS — Z86711 Personal history of pulmonary embolism: Secondary | ICD-10-CM

## 2016-09-05 DIAGNOSIS — Z9049 Acquired absence of other specified parts of digestive tract: Secondary | ICD-10-CM

## 2016-09-05 DIAGNOSIS — E1122 Type 2 diabetes mellitus with diabetic chronic kidney disease: Secondary | ICD-10-CM | POA: Diagnosis present

## 2016-09-05 DIAGNOSIS — Z794 Long term (current) use of insulin: Secondary | ICD-10-CM

## 2016-09-05 DIAGNOSIS — G9341 Metabolic encephalopathy: Secondary | ICD-10-CM | POA: Diagnosis present

## 2016-09-05 DIAGNOSIS — N183 Chronic kidney disease, stage 3 unspecified: Secondary | ICD-10-CM | POA: Diagnosis present

## 2016-09-05 DIAGNOSIS — Z79899 Other long term (current) drug therapy: Secondary | ICD-10-CM

## 2016-09-05 DIAGNOSIS — Z809 Family history of malignant neoplasm, unspecified: Secondary | ICD-10-CM | POA: Diagnosis not present

## 2016-09-05 DIAGNOSIS — I4892 Unspecified atrial flutter: Secondary | ICD-10-CM | POA: Diagnosis present

## 2016-09-05 DIAGNOSIS — K219 Gastro-esophageal reflux disease without esophagitis: Secondary | ICD-10-CM | POA: Diagnosis present

## 2016-09-05 DIAGNOSIS — I5022 Chronic systolic (congestive) heart failure: Secondary | ICD-10-CM | POA: Diagnosis present

## 2016-09-05 DIAGNOSIS — Z833 Family history of diabetes mellitus: Secondary | ICD-10-CM | POA: Diagnosis not present

## 2016-09-05 DIAGNOSIS — M1990 Unspecified osteoarthritis, unspecified site: Secondary | ICD-10-CM | POA: Diagnosis present

## 2016-09-05 DIAGNOSIS — E119 Type 2 diabetes mellitus without complications: Secondary | ICD-10-CM

## 2016-09-05 LAB — URINALYSIS, ROUTINE W REFLEX MICROSCOPIC
BACTERIA UA: NONE SEEN
Bilirubin Urine: NEGATIVE
Glucose, UA: NEGATIVE mg/dL
Ketones, ur: NEGATIVE mg/dL
NITRITE: NEGATIVE
PH: 6 (ref 5.0–8.0)
Protein, ur: 30 mg/dL — AB
SPECIFIC GRAVITY, URINE: 1.014 (ref 1.005–1.030)

## 2016-09-05 LAB — COMPREHENSIVE METABOLIC PANEL
ALBUMIN: 2.7 g/dL — AB (ref 3.5–5.0)
ALK PHOS: 68 U/L (ref 38–126)
ALT: 17 U/L (ref 14–54)
ANION GAP: 8 (ref 5–15)
AST: 30 U/L (ref 15–41)
BILIRUBIN TOTAL: 0.5 mg/dL (ref 0.3–1.2)
BUN: 13 mg/dL (ref 6–20)
CALCIUM: 8.4 mg/dL — AB (ref 8.9–10.3)
CO2: 22 mmol/L (ref 22–32)
CREATININE: 0.87 mg/dL (ref 0.44–1.00)
Chloride: 110 mmol/L (ref 101–111)
GFR calc Af Amer: >60 mL/min (ref >60)
GFR calc non Af Amer: 59 mL/min — ABNORMAL LOW (ref >60)
GLUCOSE: 125 mg/dL — AB (ref 65–99)
Potassium: 4.3 mmol/L (ref 3.5–5.1)
SODIUM: 140 mmol/L (ref 135–145)
Total Protein: 5.6 g/dL — ABNORMAL LOW (ref 6.5–8.1)

## 2016-09-05 LAB — CBC WITH DIFFERENTIAL/PLATELET
BASOS PCT: 1 %
Basophils Absolute: 0.1 10*3/uL (ref 0–0.1)
EOS PCT: 0 %
Eosinophils Absolute: 0 10*3/uL (ref 0–0.7)
HEMATOCRIT: 34.5 % — AB (ref 35.0–47.0)
Hemoglobin: 11.3 g/dL — ABNORMAL LOW (ref 12.0–16.0)
Lymphocytes Relative: 39 %
Lymphs Abs: 1.8 10*3/uL (ref 1.0–3.6)
MCH: 27.9 pg (ref 26.0–34.0)
MCHC: 32.6 g/dL (ref 32.0–36.0)
MCV: 85.5 fL (ref 80.0–100.0)
MONO ABS: 0.2 10*3/uL (ref 0.2–0.9)
MONOS PCT: 4 %
NEUTROS ABS: 2.6 10*3/uL (ref 1.4–6.5)
Neutrophils Relative %: 56 %
PLATELETS: 338 10*3/uL (ref 150–440)
RBC: 4.03 MIL/uL (ref 3.80–5.20)
RDW: 17.2 % — AB (ref 11.5–14.5)
WBC: 4.6 10*3/uL (ref 3.6–11.0)

## 2016-09-05 LAB — BRAIN NATRIURETIC PEPTIDE: B Natriuretic Peptide: 248 pg/mL — ABNORMAL HIGH (ref 0.0–100.0)

## 2016-09-05 LAB — GLUCOSE, CAPILLARY
GLUCOSE-CAPILLARY: 82 mg/dL (ref 65–99)
GLUCOSE-CAPILLARY: 136 mg/dL — AB (ref 65–99)
Glucose-Capillary: 97 mg/dL (ref 65–99)

## 2016-09-05 LAB — LACTIC ACID, PLASMA: Lactic Acid, Venous: 0.7 mmol/L (ref 0.5–1.9)

## 2016-09-05 LAB — TROPONIN I: Troponin I: <0.03 ng/mL (ref <0.03)

## 2016-09-05 MED ORDER — DEXTROSE 10 % IV SOLN
INTRAVENOUS | Status: DC
  Administered 2016-09-05: via INTRAVENOUS

## 2016-09-05 NOTE — ED Provider Notes (Signed)
[facility] Emergency Department Provider Note  ____________________________________________   First MD Initiated Contact with Patient 09/05/16 1940     (approximate)  I have reviewed the triage vital signs and the nursing notes.   HISTORY  Chief Complaint Hypoglycemia  Level V exemption history Limited by the patient's altered mental status  HPI Sherry [name] is a 83 y.o. female who comes to the emergency department via EMS for reported shortness of breath. When EMS arrived they found the patient minimally responsive with a blood sugar of 29. The right unable to establish IV access so they gave her 1 mg of intramuscular glucagon and a repeat blood sugar was 65. They said that afterwards she was more responsive.   Past Medical History:  Diagnosis Date  . Anxiety   . Arthritis   . Chronic kidney disease   . Diabetes mellitus without complication ([place])   . Dizziness   . Dyspnea   . GERD (gastroesophageal reflux disease)   . Hyperlipidemia   . Hypertension   . Pulmonary embolism ([place]) 2016    Patient Active Problem List   Diagnosis Date Noted  . Acute encephalopathy 09/05/2016  . Hypoglycemia 09/05/2016  . Chronic systolic CHF (congestive heart failure) ([place]) 09/05/2016  . Syncope 08/29/2016  . Chronic idiopathic constipation   . HLD (hyperlipidemia) 07/28/2016  . Fecal impaction of colon ([place]) 07/28/2016  . Fecal impaction ([place]) 07/28/2016  . Atrial fibrillation with RVR ([place]) 07/22/2016  . Dehydration   . Renal mass   . Chest pain, rule out acute myocardial infarction 07/12/2016  . Reactive depression   . Esophagitis determined by endoscopy 05/31/2016  . Hyperglycemia   . Hypokalemia   . Cystitis   . HH (hiatus hernia)   . Hiatal hernia with gastroesophageal reflux disease without esophagitis   . GI bleed 05/28/2016  . Upper GI bleeding 05/28/2016  . Pressure injury of skin 05/28/2016  . Atrial flutter ([place])   . Gastroenteritis  04/24/2016  . Aphasia 04/18/2016  . Nausea & vomiting 03/27/2016  . Advance care planning   . UTI (urinary tract infection) 03/12/2016  . Chest pain 03/09/2016  . PE (pulmonary thromboembolism) ([place]) 02/26/2016  . Diabetes mellitus type 2, insulin dependent ([place]) 02/26/2016  . Anemia 02/26/2016  . Chronic kidney disease (CKD), stage III (moderate) 02/26/2016  . Essential hypertension 02/26/2016  . Goals of care, counseling/discussion   . Adult failure to thrive syndrome   . Palliative care by specialist   . Sepsis ([place]) 11/26/2014    Past Surgical History:  Procedure Laterality Date  . APPENDECTOMY    . BACK SURGERY    . ESOPHAGOGASTRODUODENOSCOPY N/A 05/29/2016   Procedure: ESOPHAGOGASTRODUODENOSCOPY (EGD);  Surgeon: Clarene Essex, MD;  Location: [facility];  Service: Gastroenterology;  Laterality: N/A;  . SHOULDER SURGERY Left     Prior to Admission medications   Medication Sig Start Date End Date Taking? Authorizing Provider  amiodarone (PACERONE) 200 MG tablet Take 1 tablet (200 mg total) by mouth 2 (two) times daily. Change to q daily in 2 weeks Patient taking differently: Take 200 mg by mouth daily.  07/24/16  Yes Gladstone Lighter, MD  amLODipine (NORVASC) 5 MG tablet Take 5 mg by mouth daily.   Yes [provider]  apixaban (ELIQUIS) 2.5 MG TABS tablet Take 1 tablet (2.5 mg total) by mouth 2 (two) times daily. 07/23/16  Yes Gladstone Lighter, MD  atorvastatin (LIPITOR) 20 MG tablet Take 20 mg by mouth at bedtime.  Yes [provider]  docusate sodium (COLACE) 100 MG capsule Take 1 capsule (100 mg total) by mouth 2 (two) times daily. 08/02/16  Yes Gladstone Lighter, MD  ferrous sulfate 325 (65 FE) MG tablet Take 1 tablet (325 mg total) by mouth daily with breakfast. 06/03/16  Yes Lorella Nimrod, MD  gabapentin (NEURONTIN) 300 MG capsule Take 300 mg by mouth at bedtime.    Yes [provider]  insulin degludec (TRESIBA FLEXTOUCH) 100 UNIT/ML SOPN  FlexTouch Pen Inject 0.1 mLs (10 Units total) into the skin daily. 08/31/16  Yes Fritzi Mandes, MD  lisinopril (PRINIVIL,ZESTRIL) 20 MG tablet Take 1 tablet (20 mg total) by mouth daily. 07/24/16  Yes Gladstone Lighter, MD  magnesium oxide (MAG-OX) 400 (241.3 Mg) MG tablet Take 1 tablet (400 mg total) by mouth daily. 04/27/16  Yes Wieting, Richard, MD  megestrol (MEGACE) 40 MG tablet Take 1 tablet (40 mg total) by mouth daily. 08/02/16  Yes Gladstone Lighter, MD  meloxicam (MOBIC) 7.5 MG tablet Take 7.5 mg by mouth daily.   Yes [provider]  metoCLOPramide (REGLAN) 5 MG tablet Take 1 tablet (5 mg total) by mouth 2 (two) times daily. 06/02/16  Yes Lorella Nimrod, MD  metoprolol tartrate (LOPRESSOR) 25 MG tablet Take 1 tablet (25 mg total) by mouth 2 (two) times daily. 07/24/16  Yes Gladstone Lighter, MD  Multiple Vitamin (MULTIVITAMIN WITH MINERALS) TABS tablet Take 1 tablet by mouth daily. 06/02/16  Yes Shela Leff, MD  ondansetron (ZOFRAN-ODT) 4 MG disintegrating tablet Take 4 mg by mouth every 6 (six) hours as needed for nausea or vomiting.   Yes [provider]  oxyCODONE-acetaminophen (PERCOCET) 7.5-325 MG tablet Take 1 tablet by mouth 2 (two) times daily as needed for severe pain. 08/08/16  Yes [provider]  pantoprazole (PROTONIX) 40 MG tablet Take 1 tablet (40 mg total) by mouth 2 (two) times daily. 04/27/16  Yes Wieting, Richard, MD  polyethylene glycol [facility]) packet Take 17 g by mouth daily. 08/02/16  Yes Gladstone Lighter, MD  Potassium Chloride ER 20 MEQ TBCR Take 1 tablet by mouth daily.   Yes [provider]  sertraline (ZOLOFT) 25 MG tablet Take 1 tablet (25 mg total) by mouth daily. 06/02/16  Yes Lorella Nimrod, MD  sucralfate (CARAFATE) 1 GM/10ML suspension Take 10 mLs (1 g total) by mouth 4 (four) times daily -  with meals and at bedtime. 06/02/16  Yes Lorella Nimrod, MD  tamsulosin (FLOMAX) 0.4 MG CAPS capsule Take 1 capsule (0.4 mg total) by  mouth daily. Patient taking differently: Take 0.4 mg by mouth daily after lunch.  03/14/16  Yes Fritzi Mandes, MD    Allergies Patient has no known allergies.  Family History  Problem Relation Age of Onset  . Diabetes Mother   . Cancer Mother   . Cancer Father   . Bladder Cancer Neg Hx   . Kidney cancer Neg Hx     Social History Social History  Substance Use Topics  . Smoking status: Never Smoker  . Smokeless tobacco: Never Used  . Alcohol use No    Review of Systems Level V exemption history Limited by the patient's altered mental status ____________________________________________   PHYSICAL EXAM:  VITAL SIGNS: ED Triage Vitals  Enc Vitals Group     BP      Pulse      Resp      Temp      Temp src      SpO2  Weight      Height      Head Circumference      Peak Flow      Pain Score      Pain Loc      Pain Edu?      Excl. in [place]?     Constitutional: Responsive to painful stimulus nonverbal shivering appears uncomfortable Eyes: PERRL EOMI. Head: Atraumatic. Nose: No congestion/rhinnorhea. Mouth/Throat: No trismus Neck: No stridor.   Cardiovascular: Bradycardic rate, regular rhythm. Grossly normal heart sounds.  Good peripheral circulation. Respiratory: Increased respiratory effort.  No retractions. Lungs CTAB and moving good air Gastrointestinal: Obese soft nontender Musculoskeletal: No lower extremity edema   Neurologic:  . No gross focal neurologic deficits are appreciated. Skin:  Skin is warm, dry and intact. No rash noted.     ____________________________________________   DIFFERENTIAL includes but not limited to  Urinary tract infection, sepsis, hypothermia, hypoglycemia, renal failure ____________________________________________   LABS (all labs ordered are listed, but only abnormal results are displayed)  Labs Reviewed  BRAIN NATRIURETIC PEPTIDE - Abnormal; Notable for the following:       Result Value   B Natriuretic Peptide 248.0  (*)    All other components within normal limits  COMPREHENSIVE METABOLIC PANEL - Abnormal; Notable for the following:    Glucose, Bld 125 (*)    Calcium 8.4 (*)    Total Protein 5.6 (*)    Albumin 2.7 (*)    GFR calc non Af Amer 59 (*)    All other components within normal limits  CBC WITH DIFFERENTIAL/PLATELET - Abnormal; Notable for the following:    Hemoglobin 11.3 (*)    HCT 34.5 (*)    RDW 17.2 (*)    All other components within normal limits  URINALYSIS, ROUTINE W REFLEX MICROSCOPIC - Abnormal; Notable for the following:    Color, Urine YELLOW (*)    APPearance CLEAR (*)    Hgb urine dipstick LARGE (*)    Protein, ur 30 (*)    Leukocytes, UA TRACE (*)    Squamous Epithelial / LPF 0-5 (*)    All other components within normal limits  GLUCOSE, CAPILLARY - Abnormal; Notable for the following:    Glucose-Capillary 136 (*)    All other components within normal limits  URINE CULTURE  LACTIC ACID, PLASMA  TROPONIN I  GLUCOSE, CAPILLARY  GLUCOSE, CAPILLARY  LACTIC ACID, PLASMA  CBG MONITORING, ED    No evidence of urinary tract infection __________________________________________  EKG  ED ECG REPORT I, Darel Hong, the attending physician, personally viewed and interpreted this ECG.  Date: 09/05/2016 EKG Time:  Rate: 54 Rhythm: normal sinus rhythm QRS Axis: Leftward Intervals: normal ST/T Wave abnormalities: normal Narrative Interpretation: Sinus bradycardia with left bundle branch block and market shiver artifact no signs of obvious ischemia normal EKG  ____________________________________________  RADIOLOGY  Chest x-ray with small bilateral pleural effusions head CT with no acute disease ____________________________________________   PROCEDURES  Procedure(s) performed: yes  Angiocath insertion Performed by: Darel Hong  Consent: Verbal consent obtained. Risks and benefits: risks, benefits and alternatives were discussed Time out: Immediately  prior to procedure a "time out" was called to verify the correct patient, procedure, equipment, support staff and site/side marked as required.  Preparation: Patient was prepped and draped in the usual sterile fashion.  Vein Location: Left EJ vein  Gauge: 20  Normal blood return and flush without difficulty Patient tolerance: Patient tolerated the procedure well with no immediate complications.  Procedures  Critical Care performed: yes  CRITICAL CARE Performed by: Darel Hong   Total critical care time: 30 minutes  Critical care time was exclusive of separately billable procedures and treating other patients.  Critical care was necessary to treat or prevent imminent or life-threatening deterioration.  Critical care was time spent personally by me on the following activities: development of treatment plan with patient and/or surrogate as well as nursing, discussions with consultants, evaluation of patient's response to treatment, examination of patient, obtaining history from patient or surrogate, ordering and performing treatments and interventions, ordering and review of laboratory studies, ordering and review of radiographic studies, pulse oximetry and re-evaluation of patient's condition.   Observation: no ____________________________________________   INITIAL IMPRESSION / ASSESSMENT AND PLAN / ED COURSE  Pertinent labs & imaging results that were available during my care of the patient were reviewed by me and considered in my medical decision making (see chart for details).  [name] blood sugar on arrival is 85 however she is still profoundly confused. I established IV access and we checked a rectal temperature which is 91.6 so we have placed her on a baer hugger.      [name] glucoses trending back down and concerned she will become unresponsive again. I will place her on a D10 drip and she will require inpatient  admission.  ____________________________________________   FINAL CLINICAL IMPRESSION(S) / ED DIAGNOSES  Final diagnoses:  Hypothermia, initial encounter  Hypoglycemia      NEW MEDICATIONS STARTED DURING THIS VISIT:  New Prescriptions   No medications on file     Note:  This document was prepared using Dragon voice recognition software and may include unintentional dictation errors.     Darel Hong, MD 09/05/16 [phone]

## 2016-09-05 NOTE — ED Triage Notes (Signed)
Pt in via [facility] from home; call out was for shortness of breath, however pt found with decreased LOC, initial CBG 29.  EMS unable to establish PIV, 1mg  Glucagon PO, CBG up to 65 with EMS.  Pt A/Ox3 upon arrival.  MD to bedside.

## 2016-09-06 ENCOUNTER — Inpatient Hospital Stay
Admit: 2016-09-06 | Discharge: 2016-09-06 | Disposition: A | Discharge status: Home / Self Care | Payer: Medicare Other | Attending: Internal Medicine | Admitting: Internal Medicine

## 2016-09-06 ENCOUNTER — Inpatient Hospital Stay: Admit: 2016-09-06 | Payer: Medicare Other

## 2016-09-06 LAB — GLUCOSE, CAPILLARY
GLUCOSE-CAPILLARY: 102 mg/dL — AB (ref 65–99)
GLUCOSE-CAPILLARY: 162 mg/dL — AB (ref 65–99)
Glucose-Capillary: 126 mg/dL — ABNORMAL HIGH (ref 65–99)
Glucose-Capillary: 173 mg/dL — ABNORMAL HIGH (ref 65–99)
Glucose-Capillary: 200 mg/dL — ABNORMAL HIGH (ref 65–99)

## 2016-09-06 LAB — CBC
HCT: 28.6 % — ABNORMAL LOW (ref 35.0–47.0)
Hemoglobin: 9.6 g/dL — ABNORMAL LOW (ref 12.0–16.0)
MCH: 28.8 pg (ref 26.0–34.0)
MCHC: 33.6 g/dL (ref 32.0–36.0)
MCV: 85.9 fL (ref 80.0–100.0)
PLATELETS: 237 10*3/uL (ref 150–440)
RBC: 3.33 MIL/uL — AB (ref 3.80–5.20)
RDW: 17.2 % — AB (ref 11.5–14.5)
WBC: 5.2 10*3/uL (ref 3.6–11.0)

## 2016-09-06 LAB — TSH: TSH: 3.556 u[IU]/mL (ref 0.350–4.500)

## 2016-09-06 LAB — BASIC METABOLIC PANEL
Anion gap: 6 (ref 5–15)
BUN: 13 mg/dL (ref 6–20)
CALCIUM: 7.9 mg/dL — AB (ref 8.9–10.3)
CO2: 22 mmol/L (ref 22–32)
CREATININE: 0.93 mg/dL (ref 0.44–1.00)
Chloride: 111 mmol/L (ref 101–111)
GFR calc non Af Amer: 54 mL/min — ABNORMAL LOW (ref >60)
Glucose, Bld: 124 mg/dL — ABNORMAL HIGH (ref 65–99)
Potassium: 3.6 mmol/L (ref 3.5–5.1)
SODIUM: 139 mmol/L (ref 135–145)

## 2016-09-06 MED ORDER — TAMSULOSIN HCL 0.4 MG PO CAPS
0.4000 mg | ORAL_CAPSULE | Freq: Every day | ORAL | Status: DC
  Administered 2016-09-06: 0.4 mg via ORAL
  Filled 2016-09-06 (×2): qty 1

## 2016-09-06 MED ORDER — AMIODARONE HCL 200 MG PO TABS
200.0000 mg | ORAL_TABLET | Freq: Every day | ORAL | Status: DC
  Administered 2016-09-06 – 2016-09-07 (×2): 200 mg via ORAL
  Filled 2016-09-06 (×2): qty 1

## 2016-09-06 MED ORDER — APIXABAN 2.5 MG PO TABS
2.5000 mg | ORAL_TABLET | Freq: Two times a day (BID) | ORAL | Status: DC
  Administered 2016-09-06 – 2016-09-07 (×3): 2.5 mg via ORAL
  Filled 2016-09-06 (×3): qty 1

## 2016-09-06 MED ORDER — ATORVASTATIN CALCIUM 20 MG PO TABS
20.0000 mg | ORAL_TABLET | Freq: Every day | ORAL | Status: DC
  Administered 2016-09-06: 20 mg via ORAL
  Filled 2016-09-06: qty 1

## 2016-09-06 MED ORDER — ONDANSETRON HCL 4 MG/2ML IJ SOLN: 4.0000 mg | Freq: Four times a day (QID) | INTRAMUSCULAR | Status: DC | PRN

## 2016-09-06 MED ORDER — ACETAMINOPHEN 325 MG PO TABS: 650.0000 mg | ORAL_TABLET | Freq: Four times a day (QID) | ORAL | Status: DC | PRN

## 2016-09-06 MED ORDER — ACETAMINOPHEN 650 MG RE SUPP: 650.0000 mg | Freq: Four times a day (QID) | RECTAL | Status: DC | PRN

## 2016-09-06 MED ORDER — SERTRALINE HCL 50 MG PO TABS
25.0000 mg | ORAL_TABLET | Freq: Every day | ORAL | Status: DC
  Administered 2016-09-06 – 2016-09-07 (×2): 25 mg via ORAL
  Filled 2016-09-06 (×2): qty 1

## 2016-09-06 MED ORDER — PANTOPRAZOLE SODIUM 40 MG PO TBEC
40.0000 mg | DELAYED_RELEASE_TABLET | Freq: Two times a day (BID) | ORAL | Status: DC
  Administered 2016-09-06 – 2016-09-07 (×3): 40 mg via ORAL
  Filled 2016-09-06 (×3): qty 1

## 2016-09-06 MED ORDER — ONDANSETRON HCL 4 MG PO TABS: 4.0000 mg | ORAL_TABLET | Freq: Four times a day (QID) | ORAL | Status: DC | PRN

## 2016-09-06 NOTE — Progress Notes (Signed)
[name] NAME: Sherry [name]    MR#:  [id number]  DATE OF BIRTH:  1927-02-26  SUBJECTIVE:  CHIEF COMPLAINT:   Chief Complaint  Patient presents with  . Hypoglycemia     Came to hospital again, after recent admission and discharge for hypoglycemia. Again came with hypoglycemia. On dextrose drip, stable now. Generalized weakness.  REVIEW OF SYSTEMS:  CONSTITUTIONAL: No fever,positive for fatigue or weakness.  EYES: No blurred or double vision.  EARS, NOSE, AND THROAT: No tinnitus or ear pain.  RESPIRATORY: No cough, shortness of breath, wheezing or hemoptysis.  CARDIOVASCULAR: No chest pain, orthopnea, edema.  GASTROINTESTINAL: No nausea, vomiting, diarrhea or abdominal pain.  GENITOURINARY: No dysuria, hematuria.  ENDOCRINE: No polyuria, nocturia,  HEMATOLOGY: No anemia, easy bruising or bleeding SKIN: No rash or lesion. MUSCULOSKELETAL: No joint pain or arthritis.   NEUROLOGIC: No tingling, numbness, weakness.  PSYCHIATRY: No anxiety or depression.   ROS  DRUG ALLERGIES:  No Known Allergies  VITALS:  Blood pressure (!) 149/51, pulse 70, temperature 98.2 F (36.8 C), temperature source Oral, resp. rate 18, height 5\' 3"  (1.6 m), weight 60.5 kg (133 lb 6.4 oz), SpO2 98 %.  PHYSICAL EXAMINATION:   Constitutional: Sherry [name] is oriented to person, place, and time. Sherry [name] appears well-developed and well-nourished. No distress.  HENT:  Head: Normocephalic and atraumatic.  Mouth/Throat: Oropharynx is clear and moist.  Eyes: Pupils are equal, round, and reactive to light. Conjunctivae and EOM are normal. No scleral icterus.  Neck: Normal range of motion. Neck supple. No JVD present. No thyromegaly present.  Cardiovascular: Normal rate, regular rhythm and intact distal pulses.  Exam reveals no gallop and no friction rub.   No murmur heard. Respiratory: Effort normal and breath sounds normal. No respiratory distress. Sherry [name] has no wheezes.  Sherry [name] has no rales.  GI: Soft. Bowel sounds are normal. Sherry [name] exhibits no distension. There is no tenderness.  Musculoskeletal: Normal range of motion. Sherry [name] exhibits no edema.  No arthritis, no gout  Lymphadenopathy:    Sherry [name] has no cervical adenopathy.  Neurological: Sherry [name] is alert and oriented to person, place, and time. No cranial nerve deficit.  No dysarthria, no aphasia  Skin: Skin is warm and dry. No rash noted. No erythema.  Psychiatric: Sherry [name] has a normal mood and affect.  Physical Exam LABORATORY PANEL:   CBC  Recent Labs Lab 09/06/16 0739  WBC 5.2  HGB 9.6*  HCT 28.6*  PLT 237   ------------------------------------------------------------------------------------------------------------------  Chemistries   Recent Labs Lab 08/31/16 0453  09/05/16 2002 09/06/16 0739  NA  --   < > 140 139  K 3.4*  --  4.3 3.6  CL  --   < > 110 111  CO2  --   < > 22 22  GLUCOSE  --   < > 125* 124*  BUN  --   < > 13 13  CREATININE  --   < > 0.87 0.93  CALCIUM  --   < > 8.4* 7.9*  MG 2.0  --   --   --   AST  --   --  30  --   ALT  --   --  17  --   ALKPHOS  --   --  68  --   BILITOT  --   --  0.5  --   < > = values in this interval not displayed. ------------------------------------------------------------------------------------------------------------------  Cardiac Enzymes  Recent Labs  Lab 09/05/16 2002  TROPONINI <0.03   ------------------------------------------------------------------------------------------------------------------  RADIOLOGY:  Ct Head Wo Contrast  Result Date: 09/05/2016 CLINICAL DATA:  83 year old female with shortness of breath and altered mental status. Found to be hypoglycemic, initial CBG 29. EXAM: CT HEAD WITHOUT CONTRAST TECHNIQUE: Contiguous axial images were obtained from the base of the skull through the vertex without intravenous contrast. COMPARISON:  08/29/2016 and earlier. FINDINGS: Brain: Small areas of cortical encephalomalacia in the left  parietal lobe and posterior left temporal lobe are stable. Patchy and confluent bilateral cerebral white matter hypodensity appears stable. Stable cerebral volume. No acute intracranial hemorrhage identified. No midline shift, mass effect, or evidence of intracranial mass lesion. Stable ventricle size and configuration. No cortically based acute infarct identified. Vascular: Calcified atherosclerosis at the skull base. No suspicious intracranial vascular hyperdensity. Skull: Osteopenia.  No acute osseous abnormality identified. Sinuses/Orbits: Visualized paranasal sinuses and mastoids are stable and well pneumatized. Other: No acute orbit or scalp soft tissue findings. IMPRESSION: 1.  No acute intracranial abnormality. 2. Stable non contrast CT appearance of the brain with chronic small and medium-sized vessel ischemia, worse in the left hemisphere. Electronically Signed   By: Genevie Ann M.D.   On: 09/05/2016 20:52   Dg Chest Port 1 View  Result Date: 09/05/2016 CLINICAL DATA:  Shortness of Breath. EXAM: PORTABLE CHEST 1 VIEW COMPARISON:  08/29/2016 FINDINGS: Heart is normal size. There are small bilateral pleural effusions with bibasilar opacities, likely atelectasis. No acute bony abnormality. IMPRESSION: Small bilateral effusions with bibasilar atelectasis. Electronically Signed   By: Rolm Baptise M.D.   On: 09/05/2016 20:32    ASSESSMENT AND PLAN:   Principal Problem:   Acute encephalopathy Active Problems:   Diabetes mellitus type 2, insulin dependent (HCC)   Chronic kidney disease (CKD), stage III (moderate)   Atrial flutter (HCC)   Hypoglycemia   Chronic systolic CHF (congestive heart failure) (HCC)  * Acute encephalopathy - has improved significantly with fluids and improvement of her glucose level  * Hypoglycemia - Improved with dextrose infusion, Monitor now with Dextrose drip off. *  Diabetes mellitus type 2, insulin dependent ([place]) - currently requiring dextrose as above, Sherry [name] was on 10  U insuline daily at home, will discontinue that on discharge. *  Atrial flutter ([place]) - continue home meds *  Chronic systolic CHF (congestive heart failure) ([place]) - continue home medications *  Chronic kidney disease (CKD), stage III (moderate) - at baseline, avoid nephrotoxins and monitor  Sherry [name] has an open case of adult protective services as per Case manager. Will let social worker handle that. Psych consult to help decide decision making power, as last time- pt refused to go to rehab.  All the records are reviewed and case discussed with Care Management/Social Workerr. Management plans discussed with the patient, family and they are in agreement.  CODE STATUS: full.  TOTAL TIME TAKING CARE OF THIS PATIENT: 35 minutes.    POSSIBLE D/C IN 1- 2 DAYS, DEPENDING ON CLINICAL CONDITION.   Vaughan Basta M.D on 09/06/2016   Between 7am to 6pm - Pager - [phone]  After 6pm go to [email or web address] - password [email or web address] [facility]  Office  [phone]  CC: Primary care physician; [name], MD  Note: This dictation was prepared with Dragon dictation along with smaller phrase technology. Any transcriptional errors that result from this process are unintentional.

## 2016-09-06 NOTE — Clinical Social Work Note (Signed)
CSW contacted APS to inform them that patient is back in the hospital after recently being discharged.  CSW was informed that the assigned APS worker is Otilio Connors [phone].  CSW spoke to [place] who said there is an open investigation on patient.  APS worker also said that she spoke to patient earlier in the day, and patient's daughter is planning to take patient home with her once she is ready for discharge.  Physician requested capacity screen for patient by psych.  CSW updated APS worker that each time patient is admitted she usually refuses SNF placement.  CSW will continue to follow patient's progress throughout discharge planning.  Jones Broom. [place], MSW, [place]  09/06/2016 5:07 PM

## 2016-09-06 NOTE — Plan of Care (Signed)
Problem: Safety: Goal: Ability to remain free from injury will improve Outcome: Progressing RN will hourly round on patient to ensure patient safety. Pt is instructed to call for assistance with activity.

## 2016-09-06 NOTE — ED Notes (Signed)
Pt transported to room 244 

## 2016-09-06 NOTE — Progress Notes (Signed)
MD discussed parameters for CBG. MD Molli Hazard request to notify MD for CBG<80 and CBG>300. I will continue to assess.

## 2016-09-06 NOTE — Care Management (Addendum)
Received call from [facility] and informed that patient's daughter now no longer involved in patient's care and does not wish to be called.  Agency will notify APS of findings.  Informed that nurse that is visiting in the home "has gut feelings" but can not prove anything that concerns her. have reached out to attending regarding whether patient is competent.  Patient wants to be at home but again there are concerns as to whether her care needs are being met by her primary caregiver- son David. Need to make sure she is competent and or has capacity to fully comprehend hazard to her health being in an environment where there is major concerns regarding having her care needs met. 

## 2016-09-06 NOTE — H&P (Signed)
[facility] [place] NAME: Sherry [name]    MR#:  [id number]  DATE OF BIRTH:  02-Oct-1927  DATE OF ADMISSION:  09/05/2016  PRIMARY CARE PHYSICIAN: [name], MD   REQUESTING/REFERRING PHYSICIAN: Mable Paris, MD  CHIEF COMPLAINT:   Chief Complaint  Patient presents with  . Hypoglycemia    HISTORY OF PRESENT ILLNESS:  Sherry [name]  is a 83 y.o. female who presents with altered mental status and low blood sugar. Patient was found confused at her home and her glucose level was in the 20s. EMS gave her dextrose and brought her to the ED. She was also initially hypothermic. Her blood sugars remain persistently low here requiring dextrose infusion. Hospitalists were called for admission.  PAST MEDICAL HISTORY:   Past Medical History:  Diagnosis Date  . Anxiety   . Arthritis   . Chronic kidney disease   . Diabetes mellitus without complication ([place])   . Dizziness   . Dyspnea   . GERD (gastroesophageal reflux disease)   . Hyperlipidemia   . Hypertension   . Pulmonary embolism ([place]) 2016    PAST SURGICAL HISTORY:   Past Surgical History:  Procedure Laterality Date  . APPENDECTOMY    . BACK SURGERY    . ESOPHAGOGASTRODUODENOSCOPY N/A 05/29/2016   Procedure: ESOPHAGOGASTRODUODENOSCOPY (EGD);  Surgeon: Clarene Essex, MD;  Location: [facility];  Service: Gastroenterology;  Laterality: N/A;  . SHOULDER SURGERY Left     SOCIAL HISTORY:   Social History  Substance Use Topics  . Smoking status: Never Smoker  . Smokeless tobacco: Never Used  . Alcohol use No    FAMILY HISTORY:   Family History  Problem Relation Age of Onset  . Diabetes Mother   . Cancer Mother   . Cancer Father   . Bladder Cancer Neg Hx   . Kidney cancer Neg Hx     DRUG ALLERGIES:  No Known Allergies  MEDICATIONS AT HOME:   Prior to Admission medications   Medication Sig Start Date End Date Taking? Authorizing Provider  amiodarone (PACERONE) 200 MG  tablet Take 1 tablet (200 mg total) by mouth 2 (two) times daily. Change to q daily in 2 weeks Patient taking differently: Take 200 mg by mouth daily.  07/24/16  Yes Gladstone Lighter, MD  amLODipine (NORVASC) 5 MG tablet Take 5 mg by mouth daily.   Yes [provider]  apixaban (ELIQUIS) 2.5 MG TABS tablet Take 1 tablet (2.5 mg total) by mouth 2 (two) times daily. 07/23/16  Yes Gladstone Lighter, MD  atorvastatin (LIPITOR) 20 MG tablet Take 20 mg by mouth at bedtime.    Yes [provider]  docusate sodium (COLACE) 100 MG capsule Take 1 capsule (100 mg total) by mouth 2 (two) times daily. 08/02/16  Yes Gladstone Lighter, MD  ferrous sulfate 325 (65 FE) MG tablet Take 1 tablet (325 mg total) by mouth daily with breakfast. 06/03/16  Yes Lorella Nimrod, MD  gabapentin (NEURONTIN) 300 MG capsule Take 300 mg by mouth at bedtime.    Yes [provider]  insulin degludec (TRESIBA FLEXTOUCH) 100 UNIT/ML SOPN FlexTouch Pen Inject 0.1 mLs (10 Units total) into the skin daily. 08/31/16  Yes Fritzi Mandes, MD  lisinopril (PRINIVIL,ZESTRIL) 20 MG tablet Take 1 tablet (20 mg total) by mouth daily. 07/24/16  Yes Gladstone Lighter, MD  magnesium oxide (MAG-OX) 400 (241.3 Mg) MG tablet Take 1 tablet (400 mg total) by mouth daily. 04/27/16  Yes [place],  Richard, MD  megestrol (MEGACE) 40 MG tablet Take 1 tablet (40 mg total) by mouth daily. 08/02/16  Yes Gladstone Lighter, MD  meloxicam (MOBIC) 7.5 MG tablet Take 7.5 mg by mouth daily.   Yes [provider]  metoCLOPramide (REGLAN) 5 MG tablet Take 1 tablet (5 mg total) by mouth 2 (two) times daily. 06/02/16  Yes Lorella Nimrod, MD  metoprolol tartrate (LOPRESSOR) 25 MG tablet Take 1 tablet (25 mg total) by mouth 2 (two) times daily. 07/24/16  Yes Gladstone Lighter, MD  Multiple Vitamin (MULTIVITAMIN WITH MINERALS) TABS tablet Take 1 tablet by mouth daily. 06/02/16  Yes Shela Leff, MD  ondansetron (ZOFRAN-ODT) 4 MG disintegrating  tablet Take 4 mg by mouth every 6 (six) hours as needed for nausea or vomiting.   Yes [provider]  oxyCODONE-acetaminophen (PERCOCET) 7.5-325 MG tablet Take 1 tablet by mouth 2 (two) times daily as needed for severe pain. 08/08/16  Yes [provider]  pantoprazole (PROTONIX) 40 MG tablet Take 1 tablet (40 mg total) by mouth 2 (two) times daily. 04/27/16  Yes [place], Richard, MD  polyethylene glycol [facility]) packet Take 17 g by mouth daily. 08/02/16  Yes Gladstone Lighter, MD  Potassium Chloride ER 20 MEQ TBCR Take 1 tablet by mouth daily.   Yes [provider]  sertraline (ZOLOFT) 25 MG tablet Take 1 tablet (25 mg total) by mouth daily. 06/02/16  Yes Lorella Nimrod, MD  sucralfate (CARAFATE) 1 GM/10ML suspension Take 10 mLs (1 g total) by mouth 4 (four) times daily -  with meals and at bedtime. 06/02/16  Yes Lorella Nimrod, MD  tamsulosin (FLOMAX) 0.4 MG CAPS capsule Take 1 capsule (0.4 mg total) by mouth daily. Patient taking differently: Take 0.4 mg by mouth daily after lunch.  03/14/16  Yes Fritzi Mandes, MD    REVIEW OF SYSTEMS:  Review of Systems  Constitutional: Negative for chills, fever, malaise/fatigue and weight loss.  HENT: Negative for ear pain, hearing loss and tinnitus.   Eyes: Negative for blurred vision, double vision, pain and redness.  Respiratory: Negative for cough, hemoptysis and shortness of breath.   Cardiovascular: Negative for chest pain, palpitations, orthopnea and leg swelling.  Gastrointestinal: Negative for abdominal pain, constipation, diarrhea, nausea and vomiting.  Genitourinary: Negative for dysuria, frequency and hematuria.  Musculoskeletal: Negative for back pain, joint pain and neck pain.  Skin:       No acne, rash, or lesions  Neurological: Negative for dizziness, tremors, focal weakness and weakness.  Endo/Heme/Allergies: Negative for polydipsia. Does not bruise/bleed easily.  Psychiatric/Behavioral: Negative for depression. The  patient is not nervous/anxious and does not have insomnia.      VITAL SIGNS:   Vitals:   09/05/16 2100 09/05/16 2130 09/05/16 2230 09/05/16 2315  BP: (!) 167/60 (!) 163/58 (!) 119/52   Pulse: (!) 57 (!) 55    Resp: 15 12 15    Temp:    (!) 97.1 F (36.2 C)  TempSrc:    Rectal  SpO2: 100% 100%    Weight:      Height:       Wt Readings from Last 3 Encounters:  09/05/16 63.5 kg (140 lb)  08/31/16 58.2 kg (128 lb 4.8 oz)  08/22/16 60.6 kg (133 lb 9.6 oz)    PHYSICAL EXAMINATION:  Physical Exam  Vitals reviewed. Constitutional: She is oriented to person, place, and time. She appears well-developed and well-nourished. No distress.  HENT:  Head: Normocephalic and atraumatic.  Mouth/Throat: Oropharynx is clear and  moist.  Eyes: Pupils are equal, round, and reactive to light. Conjunctivae and EOM are normal. No scleral icterus.  Neck: Normal range of motion. Neck supple. No JVD present. No thyromegaly present.  Cardiovascular: Normal rate, regular rhythm and intact distal pulses.  Exam reveals no gallop and no friction rub.   No murmur heard. Respiratory: Effort normal and breath sounds normal. No respiratory distress. She has no wheezes. She has no rales.  GI: Soft. Bowel sounds are normal. She exhibits no distension. There is no tenderness.  Musculoskeletal: Normal range of motion. She exhibits no edema.  No arthritis, no gout  Lymphadenopathy:    She has no cervical adenopathy.  Neurological: She is alert and oriented to person, place, and time. No cranial nerve deficit.  No dysarthria, no aphasia  Skin: Skin is warm and dry. No rash noted. No erythema.  Psychiatric: She has a normal mood and affect. Her behavior is normal. Judgment and thought content normal.    LABORATORY PANEL:   CBC  Recent Labs Lab 09/05/16 2002  WBC 4.6  HGB 11.3*  HCT 34.5*  PLT 338    ------------------------------------------------------------------------------------------------------------------  Chemistries   Recent Labs Lab 08/31/16 0453 09/05/16 2002  NA  --  140  K 3.4* 4.3  CL  --  110  CO2  --  22  GLUCOSE  --  125*  BUN  --  13  CREATININE  --  0.87  CALCIUM  --  8.4*  MG 2.0  --   AST  --  30  ALT  --  17  ALKPHOS  --  68  BILITOT  --  0.5   ------------------------------------------------------------------------------------------------------------------  Cardiac Enzymes  Recent Labs Lab 09/05/16 2002  TROPONINI <0.03   ------------------------------------------------------------------------------------------------------------------  RADIOLOGY:  Ct Head Wo Contrast  Result Date: 09/05/2016 CLINICAL DATA:  83 year old female with shortness of breath and altered mental status. Found to be hypoglycemic, initial CBG 29. EXAM: CT HEAD WITHOUT CONTRAST TECHNIQUE: Contiguous axial images were obtained from the base of the skull through the vertex without intravenous contrast. COMPARISON:  08/29/2016 and earlier. FINDINGS: Brain: Small areas of cortical encephalomalacia in the left parietal lobe and posterior left temporal lobe are stable. Patchy and confluent bilateral cerebral white matter hypodensity appears stable. Stable cerebral volume. No acute intracranial hemorrhage identified. No midline shift, mass effect, or evidence of intracranial mass lesion. Stable ventricle size and configuration. No cortically based acute infarct identified. Vascular: Calcified atherosclerosis at the skull base. No suspicious intracranial vascular hyperdensity. Skull: Osteopenia.  No acute osseous abnormality identified. Sinuses/Orbits: Visualized paranasal sinuses and mastoids are stable and well pneumatized. Other: No acute orbit or scalp soft tissue findings. IMPRESSION: 1.  No acute intracranial abnormality. 2. Stable non contrast CT appearance of the brain with  chronic small and medium-sized vessel ischemia, worse in the left hemisphere. Electronically Signed   By: Genevie Ann M.D.   On: 09/05/2016 20:52   Dg Chest Port 1 View  Result Date: 09/05/2016 CLINICAL DATA:  Shortness of Breath. EXAM: PORTABLE CHEST 1 VIEW COMPARISON:  08/29/2016 FINDINGS: Heart is normal size. There are small bilateral pleural effusions with bibasilar opacities, likely atelectasis. No acute bony abnormality. IMPRESSION: Small bilateral effusions with bibasilar atelectasis. Electronically Signed   By: Rolm Baptise M.D.   On: 09/05/2016 20:32    EKG:   Orders placed or performed during the hospital encounter of 09/05/16  . ED EKG 12-Lead  . ED EKG 12-Lead    IMPRESSION AND PLAN:  Principal Problem:   Acute encephalopathy - has improved significantly with fluids and improvement of her glucose level Active Problems:   Hypoglycemia - currently still requiring dextrose infusion, will monitor and DC the dextrose when patient's blood sugars are more stable   Diabetes mellitus type 2, insulin dependent ([place]) - currently requiring dextrose as above, if her blood sugars rise significantly sliding scale insulin can be ordered   Atrial flutter ([place]) - continue home meds   Chronic systolic CHF (congestive heart failure) ([place]) - continue home medications   Chronic kidney disease (CKD), stage III (moderate) - at baseline, avoid nephrotoxins and monitor  All the records are reviewed and case discussed with ED provider. Management plans discussed with the patient and/or family.  DVT PROPHYLAXIS: Systemic anticoagulation  GI PROPHYLAXIS: PPI  ADMISSION STATUS: Inpatient  CODE STATUS: Full Code Status History    Date Active Date Inactive Code Status Order ID Comments User Context   08/29/2016  4:44 PM 08/31/2016  8:01 PM Full Code [id number]  Hillary Bow, MD ED   08/21/2016  6:15 AM 08/22/2016  7:28 PM Full Code [id number]  Lance Coon, MD Inpatient   07/28/2016  9:06 PM 08/02/2016   8:04 PM Full Code [id number]  Lance Coon, MD Inpatient   07/22/2016  4:13 AM 07/24/2016  5:43 PM Full Code [id number]  Harrie Foreman, MD Inpatient   07/12/2016 10:45 PM 07/18/2016  8:32 PM Full Code [id number]  Hugelmeyer, Alexis, DO Inpatient   05/28/2016 12:01 PM 06/02/2016  7:32 PM Full Code [id number]  Lorella Nimrod, MD Inpatient   04/24/2016  7:27 PM 04/27/2016  8:08 PM Full Code [id number]  Loletha Grayer, MD ED   04/19/2016 12:50 AM 04/22/2016 12:10 AM Full Code [id number]  Lance Coon, MD Inpatient   03/27/2016  9:39 PM 03/29/2016  9:41 PM Full Code [id number]  Fritzi Mandes, MD ED   03/12/2016 12:21 PM 03/13/2016  8:32 PM Full Code [id number]  Dustin Flock, MD Inpatient   03/09/2016  4:33 PM 03/12/2016 12:21 PM DNR [id number]  Nicholes Mango, MD ED   02/26/2016  9:39 PM 03/01/2016 10:37 PM DNR [id number]  Karmen Bongo, MD Inpatient   02/26/2016  7:48 PM 02/26/2016  9:39 PM DNR [id number]  Dorie Rank, MD ED   01/31/2016  5:45 PM 02/07/2016 11:45 PM DNR [id number]  Flora Lipps, MD Inpatient   01/30/2016 11:19 PM 01/31/2016  5:44 PM Full Code [id number]  [place], Ubaldo Glassing, DO Inpatient   11/26/2014  3:52 PM 11/28/2014  3:07 PM Full Code [id number]  Max Sane, MD Inpatient   11/26/2014  8:59 AM 11/26/2014  3:52 PM Full Code [id number]  Max Sane, MD ED      TOTAL TIME TAKING CARE OF THIS PATIENT: 45 minutes.   Logann [place]bread [place] 09/06/2016, 12:01 AM  [employer]  [phone]  CC: Primary care physician; [name], MD  Note:  This document was prepared using Dragon voice recognition software and may include unintentional dictation errors.

## 2016-09-06 NOTE — Progress Notes (Signed)
Patient arrived to 2A Room 244. Patient denies pain and all questions answered. Patient oriented to unit and use of call bell. Skin assessment completed with Wendelyn Breslow RN; scattered ecchymosis and stage II ulcer to sacrum noted. Foam applied to sacrum. A&Ox4, VSS, and NSR on verified tele-box #[id number]. Nursing staff will continue to monitor for any changes in patient status. Earleen Reaper, RN

## 2016-09-06 NOTE — Care Management (Addendum)
Patient with recent discharge 8/23 after observation stay for similar sx. An APS referral was made at that time due to concerns that patient was being left alone for extended periods of time and concern that patient was presenting with low blood sugars. Patient requires insulin and there were concerns that her diet / meals being provided are not adequate.   UPdated CSW.  CM tried to update DSS of readmission but on hold > 30 mins and never was able to speak with intake worker.  Left message for Kennyth Arnold at [facility] and notified [place].

## 2016-09-06 NOTE — Progress Notes (Signed)
Inpatient Diabetes Program Recommendations  AACE/ADA: New Consensus Statement on Inpatient Glycemic Control (2015)  Target Ranges:  Prepandial:   less than 140 mg/dL      Peak postprandial:   less than 180 mg/dL (1-2 hours)      Critically ill patients:  140 - 180 mg/dL   Lab Results  Component Value Date   GLUCAP 173 (H) 09/06/2016   HGBA1C 6.9 (H) 07/22/2016    Review of Glycemic ControlResults for ENEIDA, EVERS (MRN [id number]) as of 09/06/2016 14:07  Ref. Range 08/31/2016 08:00 08/31/2016 12:16 09/05/2016 19:46 09/05/2016 21:41 09/05/2016 23:12 09/06/2016 04:42 09/06/2016 07:38 09/06/2016 11:48  Glucose-Capillary Latest Ref Range: 65 - 99 mg/dL 134 (H) 117 (H) 82 136 (H) 97 102 (H) 126 (H) 173 (H)    Diabetes history: Type 2 DM Outpatient Diabetes medications: Tresiba 10 units daily (confirmed by Diabetes Coord. on last admit) Current orders for Inpatient glycemic control:  None  Inpatient Diabetes Program Recommendations:   Spoke with patient regarding admit- Note she was just d/c'd on 08/31/16.   She states that she was sitting on couch with son watching TV and then woke up to EMS being at her house for low blood sugar.  She states she consistently takes her insulin and is eating well.  May consider d/c of insulin due to risk of low blood sugars and lack of hypoglycemia symptoms.  Consider adding DPP-4 for diabetes such as Tradjenta 5 mg daily (Low risk hypoglycemia and not renally cleared).  She will need close follow-up with PCP.    Thanks, Adah Perl, RN, BC-ADM Inpatient Diabetes Coordinator Pager [phone] (8a-5p)

## 2016-09-07 LAB — GLUCOSE, CAPILLARY
GLUCOSE-CAPILLARY: 123 mg/dL — AB (ref 65–99)
GLUCOSE-CAPILLARY: 175 mg/dL — AB (ref 65–99)
Glucose-Capillary: 104 mg/dL — ABNORMAL HIGH (ref 65–99)
Glucose-Capillary: 135 mg/dL — ABNORMAL HIGH (ref 65–99)

## 2016-09-07 LAB — URINE CULTURE: Culture: NO GROWTH

## 2016-09-07 MED ORDER — AMIODARONE HCL 200 MG PO TABS: 200.0000 mg | ORAL_TABLET | Freq: Every day | ORAL | 0 refills | Status: DC

## 2016-09-07 NOTE — Discharge Instructions (Signed)
Please note the change in blood pressure and diabetic medicines.

## 2016-09-07 NOTE — Care Management (Signed)
CM was informed by primary nurse that the patient's daughter pulling up to hospital and  said she would not come to the unit that if would wait downstairs for patient. Patient called her daughter, then the daughter called the unit back saying she was not anywhere near the hospital and it would be several hours before she would be here.  Patient called her son who  picked up patient and transported her home

## 2016-09-07 NOTE — Discharge Summary (Signed)
[facility] NAME: Kortni Hasten    MR#:  [id number]  DATE OF BIRTH:  09-07-1927  DATE OF ADMISSION:  09/05/2016 ADMITTING PHYSICIAN: Lance Coon, MD  DATE OF DISCHARGE: 09/07/2016  4:06 PM  PRIMARY CARE PHYSICIAN: [name], MD    ADMISSION DIAGNOSIS:  Hypoglycemia [E16.2] Hypothermia, initial encounter [T68.XXXA]  DISCHARGE DIAGNOSIS:  Principal Problem:   Acute encephalopathy Active Problems:   Diabetes mellitus type 2, insulin dependent (HCC)   Chronic kidney disease (CKD), stage III (moderate)   Atrial flutter (HCC)   Hypoglycemia   Chronic systolic CHF (congestive heart failure) ([place])   SECONDARY DIAGNOSIS:   Past Medical History:  Diagnosis Date  . Anxiety   . Arthritis   . Chronic kidney disease   . Diabetes mellitus without complication ([place])   . Dizziness   . Dyspnea   . GERD (gastroesophageal reflux disease)   . Hyperlipidemia   . Hypertension   . Pulmonary embolism ([place]) 2016    HOSPITAL COURSE:   * Acute encephalopathy, metabolic due to hypoglycemia - has improved significantly with fluids and improvement of her glucose level  * Hypoglycemia - Improved with dextrose infusion, Monitor now with Dextrose drip off.   Advised to stop insuline after discharge. *Diabetes mellitus type 2, insulin dependent ([place]) - currently requiring dextrose as above, She was on 10 U insuline daily at home, will discontinue that on discharge. *Atrial flutter ([place]) - continue home meds *Chronic systolic CHF (congestive heart failure) ([place]) - Advised to stop amlodipine, lisinopril metoprolol as BP running low. *Chronic kidney disease (CKD), stage III (moderate) - at baseline, avoid nephrotoxins and monitor  DISCHARGE CONDITIONS:   Stable.  CONSULTS OBTAINED:  Treatment Team:  Gonzella Lex, MD  DRUG ALLERGIES:  No Known Allergies  DISCHARGE MEDICATIONS:   Discharge Medication List as of  09/07/2016 12:32 PM    CONTINUE these medications which have CHANGED   Details  amiodarone (PACERONE) 200 MG tablet Take 1 tablet (200 mg total) by mouth daily., Starting Fri 09/08/2016, Print      CONTINUE these medications which have NOT CHANGED   Details  apixaban (ELIQUIS) 2.5 MG TABS tablet Take 1 tablet (2.5 mg total) by mouth 2 (two) times daily., Starting Sun 07/23/2016, Print    atorvastatin (LIPITOR) 20 MG tablet Take 20 mg by mouth at bedtime. , Historical Med    docusate sodium (COLACE) 100 MG capsule Take 1 capsule (100 mg total) by mouth 2 (two) times daily., Starting Wed 08/02/2016, Normal    ferrous sulfate 325 (65 FE) MG tablet Take 1 tablet (325 mg total) by mouth daily with breakfast., Starting Sat 06/03/2016, Normal    gabapentin (NEURONTIN) 300 MG capsule Take 300 mg by mouth at bedtime. , Historical Med    magnesium oxide (MAG-OX) 400 (241.3 Mg) MG tablet Take 1 tablet (400 mg total) by mouth daily., Starting Thu 04/27/2016, Print    megestrol (MEGACE) 40 MG tablet Take 1 tablet (40 mg total) by mouth daily., Starting Wed 08/02/2016, Normal    metoCLOPramide (REGLAN) 5 MG tablet Take 1 tablet (5 mg total) by mouth 2 (two) times daily., Starting Fri 06/02/2016, Normal    Multiple Vitamin (MULTIVITAMIN WITH MINERALS) TABS tablet Take 1 tablet by mouth daily., Starting Fri 06/02/2016, Print    ondansetron (ZOFRAN-ODT) 4 MG disintegrating tablet Take 4 mg by mouth every 6 (six) hours as needed for nausea or vomiting., Historical Med  oxyCODONE-acetaminophen (PERCOCET) 7.5-325 MG tablet Take 1 tablet by mouth 2 (two) times daily as needed for severe pain., Starting Tue 08/08/2016, Historical Med    pantoprazole (PROTONIX) 40 MG tablet Take 1 tablet (40 mg total) by mouth 2 (two) times daily., Starting Thu 04/27/2016, Print    polyethylene glycol (MIRALAX) packet Take 17 g by mouth daily., Starting Wed 08/02/2016, Print    sertraline (ZOLOFT) 25 MG tablet Take 1 tablet (25 mg  total) by mouth daily., Starting Fri 06/02/2016, Normal    sucralfate (CARAFATE) 1 GM/10ML suspension Take 10 mLs (1 g total) by mouth 4 (four) times daily -  with meals and at bedtime., Starting Fri 06/02/2016, Normal    tamsulosin (FLOMAX) 0.4 MG CAPS capsule Take 1 capsule (0.4 mg total) by mouth daily., Starting Tue 03/14/2016, Normal      STOP taking these medications     amLODipine (NORVASC) 5 MG tablet      insulin degludec (TRESIBA FLEXTOUCH) 100 UNIT/ML SOPN FlexTouch Pen      lisinopril (PRINIVIL,ZESTRIL) 20 MG tablet      meloxicam (MOBIC) 7.5 MG tablet      metoprolol tartrate (LOPRESSOR) 25 MG tablet      Potassium Chloride ER 20 MEQ TBCR          DISCHARGE INSTRUCTIONS:    Follow with PMD in 1-2 weeks.  If you experience worsening of your admission symptoms, develop shortness of breath, life threatening emergency, suicidal or homicidal thoughts you must seek medical attention immediately by calling 911 or calling your MD immediately  if symptoms less severe.  You Must read complete instructions/literature along with all the possible adverse reactions/side effects for all the Medicines you take and that have been prescribed to you. Take any new Medicines after you have completely understood and accept all the possible adverse reactions/side effects.   Please note  You were cared for by a hospitalist during your hospital stay. If you have any questions about your discharge medications or the care you received while you were in the hospital after you are discharged, you can call the unit and asked to speak with the hospitalist on call if the hospitalist that took care of you is not available. Once you are discharged, your primary care physician will handle any further medical issues. Please note that NO REFILLS for any discharge medications will be authorized once you are discharged, as it is imperative that you return to your primary care physician (or establish a  relationship with a primary care physician if you do not have one) for your aftercare needs so that they can reassess your need for medications and monitor your lab values.    Today   CHIEF COMPLAINT:   Chief Complaint  Patient presents with  . Hypoglycemia    HISTORY OF PRESENT ILLNESS:  Estefani Bateson  is a 83 y.o. female presents with altered mental status and low blood sugar. Patient was found confused at her home and her glucose level was in the 20s. EMS gave her dextrose and brought her to the ED. She was also initially hypothermic. Her blood sugars remain persistently low here requiring dextrose infusion. Hospitalists were called for admission.  VITAL SIGNS:  Blood pressure (!) 111/58, pulse 93, temperature 98.1 F (36.7 C), temperature source Oral, resp. rate 18, height 5\' 3"  (1.6 m), weight 61.2 kg (134 lb 14.4 oz), SpO2 98 %.  I/O:   Intake/Output Summary (Last 24 hours) at 09/07/16 1622 Last data filed at 09/07/16  0957  Gross per 24 hour  Intake              360 ml  Output              800 ml  Net             -440 ml    PHYSICAL EXAMINATION:   Constitutional: She is oriented to person, place, and time. She appears well-developedand well-nourished. No distress.  HENT:  Head: Normocephalicand atraumatic.  Mouth/Throat: Oropharynx is clear and moist.  Eyes: Pupils are equal, round, and reactive to light. Conjunctivaeand EOMare normal. No scleral icterus.  Neck: Normal range of motion. Neck supple. No JVDpresent. No thyromegalypresent.  Cardiovascular: Normal rate, regular rhythmand intact distal pulses. Exam reveals no gallopand no friction rub.  No murmurheard. Respiratory: Effort normaland breath sounds normal. No respiratory distress. She has no wheezes. She has no rales.  GI: Soft. Bowel sounds are normal. She exhibits no distension. There is no tenderness.  Musculoskeletal: Normal range of motion. She exhibits no edema.  No arthritis, no  gout Lymphadenopathy:  She has no cervical adenopathy.  Neurological: She is alertand oriented to person, place, and time. No cranial nerve deficit.  No dysarthria, no aphasia Skin: Skin is warmand dry. No rashnoted. No erythema.  Psychiatric: She has a normal mood and affect.  DATA REVIEW:   CBC  Recent Labs Lab 09/06/16 0739  WBC 5.2  HGB 9.6*  HCT 28.6*  PLT 237    Chemistries   Recent Labs Lab 09/05/16 2002 09/06/16 0739  NA 140 139  K 4.3 3.6  CL 110 111  CO2 22 22  GLUCOSE 125* 124*  BUN 13 13  CREATININE 0.87 0.93  CALCIUM 8.4* 7.9*  AST 30  --   ALT 17  --   ALKPHOS 68  --   BILITOT 0.5  --     Cardiac Enzymes  Recent Labs Lab 09/05/16 2002  TROPONINI <0.03    Microbiology Results  Results for orders placed or performed during the hospital encounter of 09/05/16  Urine culture     Status: None   Collection Time: 09/05/16  8:02 PM  Result Value Ref Range Status   Specimen Description URINE, CATHETERIZED  Final   Special Requests NONE  Final   Culture   Final    NO GROWTH Performed at [facility], [place] [address], [place], [place] [address]    Report Status 09/07/2016 FINAL  Final    RADIOLOGY:  Ct Head Wo Contrast  Result Date: 09/05/2016 CLINICAL DATA:  82 year old female with shortness of breath and altered mental status. Found to be hypoglycemic, initial CBG 29. EXAM: CT HEAD WITHOUT CONTRAST TECHNIQUE: Contiguous axial images were obtained from the base of the skull through the vertex without intravenous contrast. COMPARISON:  08/29/2016 and earlier. FINDINGS: Brain: Small areas of cortical encephalomalacia in the left parietal lobe and posterior left temporal lobe are stable. Patchy and confluent bilateral cerebral white matter hypodensity appears stable. Stable cerebral volume. No acute intracranial hemorrhage identified. No midline shift, mass effect, or evidence of intracranial mass lesion. Stable ventricle size and  configuration. No cortically based acute infarct identified. Vascular: Calcified atherosclerosis at the skull base. No suspicious intracranial vascular hyperdensity. Skull: Osteopenia.  No acute osseous abnormality identified. Sinuses/Orbits: Visualized paranasal sinuses and mastoids are stable and well pneumatized. Other: No acute orbit or scalp soft tissue findings. IMPRESSION: 1.  No acute intracranial abnormality. 2. Stable non contrast CT  appearance of the brain with chronic small and medium-sized vessel ischemia, worse in the left hemisphere. Electronically Signed   By: Genevie Ann M.D.   On: 09/05/2016 20:52   Dg Chest Port 1 View  Result Date: 09/05/2016 CLINICAL DATA:  Shortness of Breath. EXAM: PORTABLE CHEST 1 VIEW COMPARISON:  08/29/2016 FINDINGS: Heart is normal size. There are small bilateral pleural effusions with bibasilar opacities, likely atelectasis. No acute bony abnormality. IMPRESSION: Small bilateral effusions with bibasilar atelectasis. Electronically Signed   By: Rolm Baptise M.D.   On: 09/05/2016 20:32    EKG:   Orders placed or performed during the hospital encounter of 09/05/16  . ED EKG 12-Lead  . ED EKG 12-Lead      Management plans discussed with the patient, family and they are in agreement.  CODE STATUS:     Code Status Orders        Start     Ordered   09/06/16 0038  Full code  Continuous     09/06/16 0038    Code Status History    Date Active Date Inactive Code Status Order ID Comments User Context   08/29/2016  4:44 PM 08/31/2016  8:01 PM Full Code [id number]  Hillary Bow, MD ED   08/21/2016  6:15 AM 08/22/2016  7:28 PM Full Code [id number]  Lance Coon, MD Inpatient   07/28/2016  9:06 PM 08/02/2016  8:04 PM Full Code [id number]  Lance Coon, MD Inpatient   07/22/2016  4:13 AM 07/24/2016  5:43 PM Full Code [id number]  Harrie Foreman, MD Inpatient   07/12/2016 10:45 PM 07/18/2016  8:32 PM Full Code [id number]  Hugelmeyer, Alexis, DO Inpatient   05/28/2016  12:01 PM 06/02/2016  7:32 PM Full Code [id number]  Lorella Nimrod, MD Inpatient   04/24/2016  7:27 PM 04/27/2016  8:08 PM Full Code [id number]  Loletha Grayer, MD ED   04/19/2016 12:50 AM 04/22/2016 12:10 AM Full Code [id number]  Lance Coon, MD Inpatient   03/27/2016  9:39 PM 03/29/2016  9:41 PM Full Code [id number]  Fritzi Mandes, MD ED   03/12/2016 12:21 PM 03/13/2016  8:32 PM Full Code [id number]  Dustin Flock, MD Inpatient   03/09/2016  4:33 PM 03/12/2016 12:21 PM DNR [id number]  Nicholes Mango, MD ED   02/26/2016  9:39 PM 03/01/2016 10:37 PM DNR [id number]  Karmen Bongo, MD Inpatient   02/26/2016  7:48 PM 02/26/2016  9:39 PM DNR [id number]  Dorie Rank, MD ED   01/31/2016  5:45 PM 02/07/2016 11:45 PM DNR [id number]  Flora Lipps, MD Inpatient   01/30/2016 11:19 PM 01/31/2016  5:44 PM Full Code [id number]  Kappa, Ubaldo Glassing, DO Inpatient   11/26/2014  3:52 PM 11/28/2014  3:07 PM Full Code [id number]  Max Sane, MD Inpatient   11/26/2014  8:59 AM 11/26/2014  3:52 PM Full Code [id number]  Max Sane, MD ED      TOTAL TIME TAKING CARE OF THIS PATIENT: 35 minutes.    Vaughan Basta M.D on 09/07/2016 at 4:22 PM  Between 7am to 6pm - Pager - [phone]  After 6pm go to [email or web address] - password [email or web address] [facility]  Office  [phone]  CC: Primary care physician; [name], MD   Note: This dictation was prepared with Dragon dictation along with smaller phrase technology. Any transcriptional errors that result from this process are unintentional.

## 2016-09-07 NOTE — Consult Note (Signed)
After my last note, SW did contact me and requested I complete consult at request of APS. I agreed and have now come to the unit for the consult, but the patient has been discharged and left the building.

## 2016-09-07 NOTE — Progress Notes (Signed)
Initial Nutrition Assessment  DOCUMENTATION CODES:   Not applicable  INTERVENTION:  1. Ordered snacks for patient.   NUTRITION DIAGNOSIS:   Predicted suboptimal nutrient intake related to social / environmental circumstances as evidenced by other (see comment) (repeated admissions for hypoglycemia).  GOAL:   Patient will meet greater than or equal to 90% of their needs  MONITOR:   PO intake, I & O's, Labs, Weight trends  REASON FOR ASSESSMENT:   Consult Assessment of nutrition requirement/status  ASSESSMENT:   Sherry [name] has a PMHHx of CKD, DM2, GERD, HLD, HTN, Anxiety, presented with acute encephelopathy related to lower blood sugar in the 54s. Patient was recently discharged 8/23 with similar admission. APS contacted due to concerns patient was not being provided adequate food. There is an open investigation at this time. She reports eating 3 tv dinner type meals daily for breakfast, lunch, and dinner. She refuses oral nutrition supplements, but is open to snacks like peanut butter crackers. Denies any other issues currently. Reports a UBW of 122 pounds. Weight has fluctuated over the past 2 months.  Nutrition-Focused physical exam completed. Findings are moderate fat depletion at orbitals, no muscle depletion, and moderate edema at lower extremities.   Labs reviewed  Medications reviewed Meal Completion: 15-40%   Intake/Output Summary (Last 24 hours) at 09/07/16 1210 Last data filed at 09/07/16 0957  Gross per 24 hour  Intake              360 ml  Output              800 ml  Net             -440 ml     Diet Order:  Diet Heart Room service appropriate? Yes; Fluid consistency: Thin  Skin:  Wound (see comment) (Stg II to sacrum)  Last BM:  09/05/2016  Height:   Ht Readings from Last 1 Encounters:  09/05/16 5\' 3"  (1.6 m)    Weight:   Wt Readings from Last 1 Encounters:  09/07/16 134 lb 14.4 oz (61.2 kg)    Ideal Body Weight:  52.27 kg  BMI:  Body mass  index is 23.9 kg/m.  Estimated Nutritional Needs:   Kcal:  3903-0092 calories (25-30 cal/kg)  Protein:  73-91 grams  Fluid:  1.5-1.8L  EDUCATION NEEDS:   Education needs no appropriate at this time  Satira Anis. Daegen Berrocal, MS, RD LDN Inpatient Clinical Dietitian Pager [phone]

## 2016-09-07 NOTE — Progress Notes (Addendum)
Patient given discharge teaching and paperwork regarding medications, diet, follow-up appointments and activity. Patient understanding verbalized. No complaints at this time. IV and telemetry discontinued prior to leaving. Skin assessment as previously charted and vitals are stable; on room air. Patient being discharged to home. No further needs by Care Management. Prescription handed to patient. Daughter adamant about picking up patient at [place] with Dr. Anselm Jungling - ok for patient to leave without seeing Psych or Adult Protective Services.   1626 - Patient informed nursing that daughter was about to pull in and requested to meet her outside at [employer], wheelchair was called. CM, informed RN that patient's daughter needed to come in to speak with her prior to leaving. Patient called daughter to inform her. Moments later daughter called nursing unit upset saying she was in [employer] working, not outside the hospital but that she would be here as soon as she could in a few hours. CM updated. Patient updated - refuses telemetry to be replaced while waiting. Sitting in chair, no complaints. Will continue to monitor until daughter arrives.    3299 - Patient has called son to pick her up. Leaving now.

## 2016-09-07 NOTE — Care Management Important Message (Signed)
Important Message  Patient Details  Name: Sherry [name] MRN: [id number] Date of Birth: [birth year]   Medicare Important Message Given:  N/A - LOS <3 / Initial given by admissions    Katrina Stack, RN 09/07/2016, 3:36 PM

## 2016-09-07 NOTE — Consult Note (Signed)
Chart reviewed. I had planned to see this patient today for the requested consult, but I see by the chart that a definate decision has been made to discharge her and notes indicate this will be done regardless of the psychiatry consult. Therefore I will cancel the planned consult for now. If I am incorect to do this, please let me know ASAP. Gonzella Lex M.D. psychiatry

## 2016-09-07 NOTE — Clinical Social Work Note (Signed)
CSW spoke to [place] APS worker [phone], said she will see the patient in her home to continue with open APS investigation.  APS requested discharge summary to be faxed to [place] worker, requested information has been faxed.  CSW also spoke to psych who will attempt to see patient per APS request.  Jones Broom. [place], MSW, [place]  09/07/2016 5:18 PM

## 2016-09-07 NOTE — Care Management (Addendum)
Patient is for discharge today.  Informed that patient's daughter will be here at 1pm and "patient is leaving even if she is not ready."   have asked primary nurse to inform CM when daughter arrives because need address and contact information for residence.  CM had been informed yesterday that " the daughter did not want to be contacted for anything."  Spoke with attending and patient will not be taking any insulin or blood pressure meds.  Reached out to [facility] and informed of plan.   written glycemic and blood pressure records will be of benefit to monitor readings without the medications.  Psych has not assessed yet and does not appear that Otilio Connors from social services has been on unit.

## 2016-09-08 LAB — ECHOCARDIOGRAM COMPLETE
Height: 63 in
Weight: 2134.4 oz

## 2016-09-13 ENCOUNTER — Inpatient Hospital Stay
Admission: EM | Admit: 2016-09-13 | Discharge: 2016-09-19 | DRG: 637 | Disposition: A | Discharge status: Skilled Nursing Facility | Payer: Medicare Other | Attending: Internal Medicine | Admitting: Internal Medicine

## 2016-09-13 ENCOUNTER — Emergency Department: Payer: Medicare Other

## 2016-09-13 DIAGNOSIS — I482 Chronic atrial fibrillation: Secondary | ICD-10-CM | POA: Diagnosis present

## 2016-09-13 DIAGNOSIS — I10 Essential (primary) hypertension: Secondary | ICD-10-CM | POA: Diagnosis not present

## 2016-09-13 DIAGNOSIS — I2609 Other pulmonary embolism with acute cor pulmonale: Secondary | ICD-10-CM | POA: Diagnosis not present

## 2016-09-13 DIAGNOSIS — R627 Adult failure to thrive: Secondary | ICD-10-CM | POA: Diagnosis present

## 2016-09-13 DIAGNOSIS — I5022 Chronic systolic (congestive) heart failure: Secondary | ICD-10-CM | POA: Diagnosis present

## 2016-09-13 DIAGNOSIS — R079 Chest pain, unspecified: Secondary | ICD-10-CM | POA: Diagnosis not present

## 2016-09-13 DIAGNOSIS — N183 Chronic kidney disease, stage 3 (moderate): Secondary | ICD-10-CM | POA: Diagnosis present

## 2016-09-13 DIAGNOSIS — E876 Hypokalemia: Secondary | ICD-10-CM | POA: Diagnosis present

## 2016-09-13 DIAGNOSIS — E785 Hyperlipidemia, unspecified: Secondary | ICD-10-CM | POA: Diagnosis present

## 2016-09-13 DIAGNOSIS — Z794 Long term (current) use of insulin: Secondary | ICD-10-CM | POA: Diagnosis not present

## 2016-09-13 DIAGNOSIS — J90 Pleural effusion, not elsewhere classified: Secondary | ICD-10-CM | POA: Diagnosis present

## 2016-09-13 DIAGNOSIS — R109 Unspecified abdominal pain: Secondary | ICD-10-CM | POA: Diagnosis not present

## 2016-09-13 DIAGNOSIS — R000 Tachycardia, unspecified: Secondary | ICD-10-CM | POA: Diagnosis present

## 2016-09-13 DIAGNOSIS — Z87440 Personal history of urinary (tract) infections: Secondary | ICD-10-CM

## 2016-09-13 DIAGNOSIS — X58XXXA Exposure to other specified factors, initial encounter: Secondary | ICD-10-CM | POA: Diagnosis present

## 2016-09-13 DIAGNOSIS — E1122 Type 2 diabetes mellitus with diabetic chronic kidney disease: Secondary | ICD-10-CM | POA: Diagnosis present

## 2016-09-13 DIAGNOSIS — E872 Acidosis: Secondary | ICD-10-CM | POA: Diagnosis present

## 2016-09-13 DIAGNOSIS — E1140 Type 2 diabetes mellitus with diabetic neuropathy, unspecified: Secondary | ICD-10-CM | POA: Diagnosis present

## 2016-09-13 DIAGNOSIS — E870 Hyperosmolality and hypernatremia: Secondary | ICD-10-CM | POA: Diagnosis not present

## 2016-09-13 DIAGNOSIS — D649 Anemia, unspecified: Secondary | ICD-10-CM | POA: Diagnosis not present

## 2016-09-13 DIAGNOSIS — Z7901 Long term (current) use of anticoagulants: Secondary | ICD-10-CM

## 2016-09-13 DIAGNOSIS — N2889 Other specified disorders of kidney and ureter: Secondary | ICD-10-CM | POA: Diagnosis not present

## 2016-09-13 DIAGNOSIS — J181 Lobar pneumonia, unspecified organism: Secondary | ICD-10-CM | POA: Diagnosis not present

## 2016-09-13 DIAGNOSIS — Z79899 Other long term (current) drug therapy: Secondary | ICD-10-CM

## 2016-09-13 DIAGNOSIS — E162 Hypoglycemia, unspecified: Secondary | ICD-10-CM

## 2016-09-13 DIAGNOSIS — T68XXXA Hypothermia, initial encounter: Secondary | ICD-10-CM | POA: Diagnosis present

## 2016-09-13 DIAGNOSIS — K219 Gastro-esophageal reflux disease without esophagitis: Secondary | ICD-10-CM | POA: Diagnosis present

## 2016-09-13 DIAGNOSIS — R112 Nausea with vomiting, unspecified: Secondary | ICD-10-CM | POA: Diagnosis not present

## 2016-09-13 DIAGNOSIS — E11622 Type 2 diabetes mellitus with other skin ulcer: Secondary | ICD-10-CM | POA: Diagnosis present

## 2016-09-13 DIAGNOSIS — E109 Type 1 diabetes mellitus without complications: Secondary | ICD-10-CM | POA: Diagnosis not present

## 2016-09-13 DIAGNOSIS — Z833 Family history of diabetes mellitus: Secondary | ICD-10-CM

## 2016-09-13 DIAGNOSIS — I130 Hypertensive heart and chronic kidney disease with heart failure and stage 1 through stage 4 chronic kidney disease, or unspecified chronic kidney disease: Secondary | ICD-10-CM | POA: Diagnosis present

## 2016-09-13 DIAGNOSIS — R609 Edema, unspecified: Secondary | ICD-10-CM

## 2016-09-13 DIAGNOSIS — K920 Hematemesis: Secondary | ICD-10-CM | POA: Diagnosis present

## 2016-09-13 DIAGNOSIS — D638 Anemia in other chronic diseases classified elsewhere: Secondary | ICD-10-CM | POA: Diagnosis present

## 2016-09-13 DIAGNOSIS — Z79891 Long term (current) use of opiate analgesic: Secondary | ICD-10-CM

## 2016-09-13 DIAGNOSIS — E11649 Type 2 diabetes mellitus with hypoglycemia without coma: Principal | ICD-10-CM | POA: Diagnosis present

## 2016-09-13 DIAGNOSIS — Z86711 Personal history of pulmonary embolism: Secondary | ICD-10-CM | POA: Diagnosis not present

## 2016-09-13 DIAGNOSIS — G9340 Encephalopathy, unspecified: Secondary | ICD-10-CM

## 2016-09-13 DIAGNOSIS — T68XXXS Hypothermia, sequela: Secondary | ICD-10-CM | POA: Diagnosis not present

## 2016-09-13 DIAGNOSIS — G9341 Metabolic encephalopathy: Secondary | ICD-10-CM | POA: Diagnosis present

## 2016-09-13 DIAGNOSIS — K210 Gastro-esophageal reflux disease with esophagitis: Secondary | ICD-10-CM | POA: Diagnosis present

## 2016-09-13 DIAGNOSIS — L89151 Pressure ulcer of sacral region, stage 1: Secondary | ICD-10-CM | POA: Diagnosis present

## 2016-09-13 DIAGNOSIS — Z515 Encounter for palliative care: Secondary | ICD-10-CM | POA: Diagnosis not present

## 2016-09-13 DIAGNOSIS — E039 Hypothyroidism, unspecified: Secondary | ICD-10-CM | POA: Diagnosis present

## 2016-09-13 DIAGNOSIS — F0391 Unspecified dementia with behavioral disturbance: Secondary | ICD-10-CM | POA: Diagnosis not present

## 2016-09-13 LAB — COMPREHENSIVE METABOLIC PANEL
ALBUMIN: 3.2 g/dL — AB (ref 3.5–5.0)
ALK PHOS: 86 U/L (ref 38–126)
ALT: 13 U/L — AB (ref 14–54)
AST: 28 U/L (ref 15–41)
Anion gap: 10 (ref 5–15)
BUN: 12 mg/dL (ref 6–20)
CALCIUM: 8.4 mg/dL — AB (ref 8.9–10.3)
CHLORIDE: 105 mmol/L (ref 101–111)
CO2: 23 mmol/L (ref 22–32)
CREATININE: 0.96 mg/dL (ref 0.44–1.00)
GFR calc non Af Amer: 52 mL/min — ABNORMAL LOW (ref >60)
GLUCOSE: 143 mg/dL — AB (ref 65–99)
Potassium: 2.5 mmol/L — CL (ref 3.5–5.1)
SODIUM: 138 mmol/L (ref 135–145)
Total Bilirubin: 0.6 mg/dL (ref 0.3–1.2)
Total Protein: 6.9 g/dL (ref 6.5–8.1)

## 2016-09-13 LAB — CBC
HEMATOCRIT: 37.1 % (ref 35.0–47.0)
HEMOGLOBIN: 12.2 g/dL (ref 12.0–16.0)
MCH: 27.7 pg (ref 26.0–34.0)
MCHC: 33 g/dL (ref 32.0–36.0)
MCV: 83.9 fL (ref 80.0–100.0)
PLATELETS: 268 10*3/uL (ref 150–440)
RBC: 4.42 MIL/uL (ref 3.80–5.20)
RDW: 16.1 % — ABNORMAL HIGH (ref 11.5–14.5)
WBC: 5.7 10*3/uL (ref 3.6–11.0)

## 2016-09-13 LAB — GLUCOSE, CAPILLARY
GLUCOSE-CAPILLARY: 83 mg/dL (ref 65–99)
Glucose-Capillary: 145 mg/dL — ABNORMAL HIGH (ref 65–99)

## 2016-09-13 LAB — MAGNESIUM: MAGNESIUM: 1.6 mg/dL — AB (ref 1.7–2.4)

## 2016-09-13 LAB — LACTIC ACID, PLASMA: Lactic Acid, Venous: 2.2 mmol/L (ref 0.5–1.9)

## 2016-09-13 LAB — T4, FREE: Free T4: 1.36 ng/dL — ABNORMAL HIGH (ref 0.61–1.12)

## 2016-09-13 LAB — TROPONIN I: Troponin I: <0.03 ng/mL (ref <0.03)

## 2016-09-13 LAB — TSH: TSH: 8.986 u[IU]/mL — ABNORMAL HIGH (ref 0.350–4.500)

## 2016-09-13 MED ORDER — DEXTROSE 10 % IV SOLN
INTRAVENOUS | Status: DC
  Administered 2016-09-13: 23:00:00 via INTRAVENOUS
  Administered 2016-09-14: 150 mL/h via INTRAVENOUS
  Administered 2016-09-14: 22:00:00 via INTRAVENOUS
  Administered 2016-09-14: 150 mL/h via INTRAVENOUS

## 2016-09-13 MED ORDER — MAGNESIUM SULFATE 2 GM/50ML IV SOLN
2.0000 g | Freq: Once | INTRAVENOUS | Status: AC
  Administered 2016-09-13: 2 g via INTRAVENOUS
  Filled 2016-09-13: qty 50

## 2016-09-13 MED ORDER — POTASSIUM CHLORIDE 10 MEQ/100ML IV SOLN
10.0000 meq | INTRAVENOUS | Status: AC
  Administered 2016-09-14: 10 meq via INTRAVENOUS
  Filled 2016-09-13 (×4): qty 100

## 2016-09-13 NOTE — ED Provider Notes (Signed)
[facility] Emergency Department Provider Note  ____________________________________________   First MD Initiated Contact with Patient 09/13/16 2216     (approximate)  I have reviewed the triage vital signs and the nursing notes.   HISTORY  Chief Complaint Altered Mental Status and Hypoglycemia  Level 5 caveat:  history/ROS limited by acute/critical illness  HPI Sherry [name] is a 83 y.o. female with extensive chronic medical illness who presents by EMS for altered mental status.  She has had multiple similar episodes in the past.  Her family reports that they have continued to give her the insulin she has been prescribed but the patient does not eat or drink well.  History is minimal and provided by EMS, but apparently they found her minimally responsive similar to prior episodes.  Her blood sugar was extremely low at 17 by EMS so they gave an ampule of D50 which brought her blood sugar out briefly but they rechecked it during transportation and it had dropped down quite low again.  They provided another one half ampule which brought her up to greater than 200 blood glucose level upon arrival in the ED.During her triage in the emergency department her fingerstick blood sugar was 145, but we checked it again about 45 minutes later and it had dropped back down to 83.  The patient is not able to offer any history.  She is somnolent, awakens to loud voice and light touch, denies any current pain, and goes back to sleep.   Past Medical History:  Diagnosis Date  . Anxiety   . Arthritis   . Chronic kidney disease   . Diabetes mellitus without complication ([place])   . Dizziness   . Dyspnea   . GERD (gastroesophageal reflux disease)   . Hyperlipidemia   . Hypertension   . Pulmonary embolism ([place]) 2016    Patient Active Problem List   Diagnosis Date Noted  . Hypothermia 09/13/2016  . Acute encephalopathy 09/05/2016  . Hypoglycemia 09/05/2016  . Chronic systolic  CHF (congestive heart failure) ([place]) 09/05/2016  . Syncope 08/29/2016  . Chronic idiopathic constipation   . HLD (hyperlipidemia) 07/28/2016  . Fecal impaction of colon ([place]) 07/28/2016  . Fecal impaction ([place]) 07/28/2016  . Atrial fibrillation with RVR ([place]) 07/22/2016  . Dehydration   . Renal mass   . Chest pain, rule out acute myocardial infarction 07/12/2016  . Reactive depression   . Esophagitis determined by endoscopy 05/31/2016  . Hyperglycemia   . Hypokalemia   . Cystitis   . HH (hiatus hernia)   . Hiatal hernia with gastroesophageal reflux disease without esophagitis   . GI bleed 05/28/2016  . Upper GI bleeding 05/28/2016  . Pressure injury of skin 05/28/2016  . Atrial flutter ([place])   . Gastroenteritis 04/24/2016  . Aphasia 04/18/2016  . Nausea & vomiting 03/27/2016  . Advance care planning   . UTI (urinary tract infection) 03/12/2016  . Chest pain 03/09/2016  . PE (pulmonary thromboembolism) ([place]) 02/26/2016  . Diabetes mellitus type 2, insulin dependent ([place]) 02/26/2016  . Anemia 02/26/2016  . Chronic kidney disease (CKD), stage III (moderate) 02/26/2016  . Essential hypertension 02/26/2016  . Goals of care, counseling/discussion   . Adult failure to thrive syndrome   . Palliative care by specialist   . Sepsis ([place]) 11/26/2014    Past Surgical History:  Procedure Laterality Date  . APPENDECTOMY    . BACK SURGERY    . ESOPHAGOGASTRODUODENOSCOPY N/A 05/29/2016   Procedure: ESOPHAGOGASTRODUODENOSCOPY (EGD);  Surgeon: Clarene Essex, MD;  Location: [place];  Service: Gastroenterology;  Laterality: N/A;  . SHOULDER SURGERY Left     Prior to Admission medications   Medication Sig Start Date End Date Taking? Authorizing Provider  amiodarone (PACERONE) 200 MG tablet Take 1 tablet (200 mg total) by mouth daily. 09/08/16  Yes Vaughan Basta, MD  apixaban (ELIQUIS) 2.5 MG TABS tablet Take 1 tablet (2.5 mg total) by mouth 2 (two) times daily. 07/23/16  Yes  Gladstone Lighter, MD  atorvastatin (LIPITOR) 20 MG tablet Take 20 mg by mouth at bedtime.    Yes [provider]  docusate sodium (COLACE) 100 MG capsule Take 1 capsule (100 mg total) by mouth 2 (two) times daily. 08/02/16  Yes Gladstone Lighter, MD  ferrous sulfate 325 (65 FE) MG tablet Take 1 tablet (325 mg total) by mouth daily with breakfast. 06/03/16  Yes Lorella Nimrod, MD  gabapentin (NEURONTIN) 300 MG capsule Take 300 mg by mouth at bedtime.    Yes [provider]  magnesium oxide (MAG-OX) 400 (241.3 Mg) MG tablet Take 1 tablet (400 mg total) by mouth daily. 04/27/16  Yes Wieting, Richard, MD  megestrol (MEGACE) 40 MG tablet Take 1 tablet (40 mg total) by mouth daily. 08/02/16  Yes Gladstone Lighter, MD  metoCLOPramide (REGLAN) 5 MG tablet Take 1 tablet (5 mg total) by mouth 2 (two) times daily. 06/02/16  Yes Lorella Nimrod, MD  Multiple Vitamin (MULTIVITAMIN WITH MINERALS) TABS tablet Take 1 tablet by mouth daily. 06/02/16  Yes Shela Leff, MD  ondansetron (ZOFRAN-ODT) 4 MG disintegrating tablet Take 4 mg by mouth every 6 (six) hours as needed for nausea or vomiting.   Yes [provider]  oxyCODONE-acetaminophen (PERCOCET) 7.5-325 MG tablet Take 1 tablet by mouth 2 (two) times daily as needed for severe pain. 08/08/16  Yes [provider]  pantoprazole (PROTONIX) 40 MG tablet Take 1 tablet (40 mg total) by mouth 2 (two) times daily. 04/27/16  Yes Wieting, Richard, MD  polyethylene glycol [facility]) packet Take 17 g by mouth daily. 08/02/16  Yes Gladstone Lighter, MD  sertraline (ZOLOFT) 25 MG tablet Take 1 tablet (25 mg total) by mouth daily. 06/02/16  Yes Lorella Nimrod, MD  sucralfate (CARAFATE) 1 GM/10ML suspension Take 10 mLs (1 g total) by mouth 4 (four) times daily -  with meals and at bedtime. 06/02/16  Yes Lorella Nimrod, MD  tamsulosin (FLOMAX) 0.4 MG CAPS capsule Take 1 capsule (0.4 mg total) by mouth daily. Patient taking differently: Take 0.4 mg  by mouth daily after lunch.  03/14/16  Yes Fritzi Mandes, MD    Allergies Patient has no known allergies.  Family History  Problem Relation Age of Onset  . Diabetes Mother   . Cancer Mother   . Cancer Father   . Bladder Cancer Neg Hx   . Kidney cancer Neg Hx     Social History Social History  Substance Use Topics  . Smoking status: Never Smoker  . Smokeless tobacco: Never Used  . Alcohol use No    Review of Systems Level 5 caveat:  history/ROS limited by acute/critical illness ____________________________________________   PHYSICAL EXAM:  VITAL SIGNS: ED Triage Vitals  Enc Vitals Group     BP --      Pulse Rate 09/13/16 2152 93     Resp 09/13/16 2152 14     Temp 09/13/16 2215 (!) 90.1 F (32.3 C)     Temp Source 09/13/16 2215 Rectal  SpO2 09/13/16 2156 100 %     Weight 09/13/16 2155 65.8 kg (145 lb)     Height 09/13/16 2155 1.6 m (5\' 3" )     Head Circumference --      Peak Flow --      Pain Score 09/13/16 2151 0     Pain Loc --      Pain Edu? --      Excl. in [place]? --     Constitutional: Somnolent, elderly, appears chronically ill Eyes: Conjunctivae are normal.  Head: Atraumatic. Nose: No congestion/rhinnorhea. Neck: No stridor.  No meningeal signs.   Cardiovascular: Normal rate, regular rhythm. Cool skin/extremities Respiratory: Normal respiratory effort.  No retractions. Lungs CTAB. Gastrointestinal: Soft and nontender. No distention.  Musculoskeletal: No lower extremity tenderness nor edema. No gross deformities of extremities. Neurologic:  Unable to participate in neuro exam, but patient has no obvious gross neuro deficits Skin:  Skin is cool, dry and intact. No rash noted.   ____________________________________________   LABS (all labs ordered are listed, but only abnormal results are displayed)  Labs Reviewed  COMPREHENSIVE METABOLIC PANEL - Abnormal; Notable for the following:       Result Value   Potassium 2.5 (*)    Glucose, Bld 143 (*)      Calcium 8.4 (*)    Albumin 3.2 (*)    ALT 13 (*)    GFR calc non Af Amer 52 (*)    All other components within normal limits  GLUCOSE, CAPILLARY - Abnormal; Notable for the following:    Glucose-Capillary 145 (*)    All other components within normal limits  LACTIC ACID, PLASMA - Abnormal; Notable for the following:    Lactic Acid, Venous 2.2 (*)    All other components within normal limits  CBC - Abnormal; Notable for the following:    RDW 16.1 (*)    All other components within normal limits  T4, FREE - Abnormal; Notable for the following:    Free T4 1.36 (*)    All other components within normal limits  TSH - Abnormal; Notable for the following:    TSH 8.986 (*)    All other components within normal limits  MAGNESIUM - Abnormal; Notable for the following:    Magnesium 1.6 (*)    All other components within normal limits  URINE CULTURE  TROPONIN I  GLUCOSE, CAPILLARY  GLUCOSE, CAPILLARY  LACTIC ACID, PLASMA  URINALYSIS, COMPLETE (UACMP) WITH MICROSCOPIC  PROCALCITONIN  CBG MONITORING, ED   ____________________________________________  EKG  ED ECG REPORT I, Jelena Malicoat, the attending physician, personally viewed and interpreted this ECG.  Date: 09/13/2016 EKG Time: 21:55 Rate: 91 Rhythm: non-specific rhythm w/ irregular rate QRS Axis: normal Intervals: prolonged QTc at 559 ms ST/T Wave abnormalities: Non-specific ST segment / T-wave changes, but no evidence of acute ischemia. Narrative Interpretation: no evidence of acute ischemia   ____________________________________________  RADIOLOGY   Dg Chest Port 1 View  Result Date: 09/13/2016 CLINICAL DATA:  Sepsis. EXAM: PORTABLE CHEST 1 VIEW COMPARISON:  Most recent chest radiograph 08/28/2016 FINDINGS: Unchanged heart size and mediastinal contours. Improved bibasilar aeration with decreasing pleural effusions and atelectasis. No pulmonary edema. No pneumothorax, skin fold projects over the right chest. No  confluent airspace disease or pneumothorax. Bones are under mineralized with chronic degenerative change of the shoulders. IMPRESSION: Improved bibasilar aeration with decreasing pleural effusions and bibasilar atelectasis. Electronically Signed   By: Jeb Levering M.D.   On: 09/13/2016 23:01  ____________________________________________   PROCEDURES  Critical Care performed: Yes, see critical care procedure note(s)   Procedure(s) performed:   .Critical Care Performed by: Hinda Kehr Authorized by: Hinda Kehr   Critical care provider statement:    Critical care time (minutes):  30   Critical care time was exclusive of:  Separately billable procedures and treating other patients   Critical care was necessary to treat or prevent imminent or life-threatening deterioration of the following conditions:  Metabolic crisis   Critical care was time spent personally by me on the following activities:  Development of treatment plan with patient or surrogate, discussions with consultants, evaluation of patient's response to treatment, examination of patient, obtaining history from patient or surrogate, ordering and performing treatments and interventions, ordering and review of laboratory studies, ordering and review of radiographic studies, pulse oximetry, re-evaluation of patient's condition and review of old charts     ____________________________________________   INITIAL IMPRESSION / [place] / ED COURSE  Pertinent labs & imaging results that were available during my care of the patient were reviewed by me and considered in my medical decision making (see chart for details).  Hypoglycemic and hypothermic upon arrival starting sepsis protocol and Bair hugger   Clinical Course as of Sep 14 13  Wed Sep 13, 2016  2234 Although the patient presents profoundly hypothermic, she was also profoundly hypoglycemic for EMS and she had an almost identical presentation just  over a week ago.  She had no sign of infection/sepsis at that time and this seems to be a repeat presentation.  Reportedly the family is continuing to administer her insulin even though she does not eat.  She required dextrose infusion and admission the last time and after her temperature and hypothermia improved, her encephalopathy resolved.  I have added on TSH and free T4, but I suspect this is again encephalopathy and metabolic derangement due to hypoglycemia secondary to insulin administration without eating.  Her repeat CBG has dropped about 60 points just since coming to the ED (about 40 minutes ago) and I have ordered a D10 infusion @ 181mL/hr.  [CF]    Clinical Course User Index [CF] Hinda Kehr, MD    ____________________________________________  FINAL CLINICAL IMPRESSION(S) / ED DIAGNOSES  Final diagnoses:  Hypoglycemia  Hypokalemia  Hypothermia, initial encounter  Acute encephalopathy     MEDICATIONS GIVEN DURING THIS VISIT:  Medications  dextrose 10 % infusion ( Intravenous New Bag/Given 09/13/16 2242)  potassium chloride 10 mEq in 100 mL IVPB (not administered)  magnesium sulfate IVPB 2 g 50 mL (2 g Intravenous New Bag/Given 09/13/16 2317)     NEW OUTPATIENT MEDICATIONS STARTED DURING THIS VISIT:  New Prescriptions   No medications on file    Modified Medications   No medications on file    Discontinued Medications   No medications on file     Note:  This document was prepared using Dragon voice recognition software and may include unintentional dictation errors.    Hinda Kehr, MD 09/14/16 [phone]

## 2016-09-13 NOTE — ED Triage Notes (Signed)
Pt bib [facility] d/t AMS. FD states Failure to thrive, multiple call outs recently for same-pt at baseline. EMS states CBG 17 on scene, pt diaporetic, ampD50, CBG 170. RepeatCBG 70 within 10 minutes, gave half ampD50 and CBG 205. Upon arrival to ED CBG 145. EMS states pt lives at home with son, pt daughter comes over to give meds. Insulin dependent, has not eaten today.

## 2016-09-13 NOTE — ED Notes (Signed)
Pt difficult stick, IV team consulted for IV for redraw and drip.

## 2016-09-14 DIAGNOSIS — T68XXXS Hypothermia, sequela: Secondary | ICD-10-CM

## 2016-09-14 DIAGNOSIS — K920 Hematemesis: Secondary | ICD-10-CM

## 2016-09-14 DIAGNOSIS — E162 Hypoglycemia, unspecified: Secondary | ICD-10-CM

## 2016-09-14 DIAGNOSIS — D649 Anemia, unspecified: Secondary | ICD-10-CM

## 2016-09-14 LAB — CBC WITH DIFFERENTIAL/PLATELET
Basophils Absolute: 0 10*3/uL (ref 0–0.1)
Basophils Relative: 1 %
EOS ABS: 0 10*3/uL (ref 0–0.7)
Eosinophils Relative: 0 %
HEMATOCRIT: 28.2 % — AB (ref 35.0–47.0)
Hemoglobin: 9.8 g/dL — ABNORMAL LOW (ref 12.0–16.0)
LYMPHS ABS: 1 10*3/uL (ref 1.0–3.6)
Lymphocytes Relative: 12 %
MCH: 29.2 pg (ref 26.0–34.0)
MCHC: 34.7 g/dL (ref 32.0–36.0)
MCV: 84.1 fL (ref 80.0–100.0)
MONOS PCT: 2 %
Monocytes Absolute: 0.2 10*3/uL (ref 0.2–0.9)
NEUTROS ABS: 7.2 10*3/uL — AB (ref 1.4–6.5)
NEUTROS PCT: 85 %
Platelets: 230 10*3/uL (ref 150–440)
RBC: 3.35 MIL/uL — ABNORMAL LOW (ref 3.80–5.20)
RDW: 15.9 % — ABNORMAL HIGH (ref 11.5–14.5)
WBC: 8.5 10*3/uL (ref 3.6–11.0)

## 2016-09-14 LAB — URINALYSIS, COMPLETE (UACMP) WITH MICROSCOPIC
BACTERIA UA: NONE SEEN
BILIRUBIN URINE: NEGATIVE
GLUCOSE, UA: 50 mg/dL — AB
Ketones, ur: NEGATIVE mg/dL
LEUKOCYTES UA: NEGATIVE
NITRITE: NEGATIVE
PROTEIN: 30 mg/dL — AB
SPECIFIC GRAVITY, URINE: 1.012 (ref 1.005–1.030)
SQUAMOUS EPITHELIAL / LPF: NONE SEEN
pH: 5 (ref 5.0–8.0)

## 2016-09-14 LAB — TROPONIN I
TROPONIN I: 0.41 ng/mL — AB (ref <0.03)
Troponin I: <0.03 ng/mL (ref <0.03)

## 2016-09-14 LAB — GLUCOSE, CAPILLARY
Glucose-Capillary: 133 mg/dL — ABNORMAL HIGH (ref 65–99)
Glucose-Capillary: 133 mg/dL — ABNORMAL HIGH (ref 65–99)
Glucose-Capillary: 158 mg/dL — ABNORMAL HIGH (ref 65–99)
Glucose-Capillary: 159 mg/dL — ABNORMAL HIGH (ref 65–99)
Glucose-Capillary: 250 mg/dL — ABNORMAL HIGH (ref 65–99)
Glucose-Capillary: 259 mg/dL — ABNORMAL HIGH (ref 65–99)
Glucose-Capillary: 31 mg/dL — CL (ref 65–99)
Glucose-Capillary: 49 mg/dL — ABNORMAL LOW (ref 65–99)
Glucose-Capillary: 54 mg/dL — ABNORMAL LOW (ref 65–99)
Glucose-Capillary: 57 mg/dL — ABNORMAL LOW (ref 65–99)
Glucose-Capillary: 65 mg/dL (ref 65–99)
Glucose-Capillary: 82 mg/dL (ref 65–99)
Glucose-Capillary: 85 mg/dL (ref 65–99)
Glucose-Capillary: 97 mg/dL (ref 65–99)

## 2016-09-14 LAB — MRSA PCR SCREENING: MRSA by PCR: NEGATIVE

## 2016-09-14 LAB — APTT: aPTT: 28 seconds (ref 24–36)

## 2016-09-14 LAB — POTASSIUM: Potassium: 4 mmol/L (ref 3.5–5.1)

## 2016-09-14 LAB — LACTIC ACID, PLASMA: Lactic Acid, Venous: 1.7 mmol/L (ref 0.5–1.9)

## 2016-09-14 LAB — PROCALCITONIN

## 2016-09-14 LAB — PROTIME-INR
INR: 0.91
Prothrombin Time: 12.2 seconds (ref 11.4–15.2)

## 2016-09-14 MED ORDER — MEGESTROL ACETATE 40 MG PO TABS
40.0000 mg | ORAL_TABLET | Freq: Every day | ORAL | Status: DC
  Administered 2016-09-15 – 2016-09-18 (×4): 40 mg via ORAL
  Filled 2016-09-14 (×7): qty 1

## 2016-09-14 MED ORDER — HYDRALAZINE HCL 20 MG/ML IJ SOLN
10.0000 mg | INTRAMUSCULAR | Status: DC | PRN
  Administered 2016-09-14: 10 mg via INTRAVENOUS
  Filled 2016-09-14: qty 1

## 2016-09-14 MED ORDER — DOCUSATE SODIUM 100 MG PO CAPS
100.0000 mg | ORAL_CAPSULE | Freq: Two times a day (BID) | ORAL | Status: DC
  Administered 2016-09-14 – 2016-09-19 (×9): 100 mg via ORAL
  Filled 2016-09-14 (×10): qty 1

## 2016-09-14 MED ORDER — ACETAMINOPHEN 325 MG PO TABS: 650.0000 mg | ORAL_TABLET | Freq: Four times a day (QID) | ORAL | Status: DC | PRN

## 2016-09-14 MED ORDER — DEXTROSE 50 % IV SOLN
INTRAVENOUS | Status: AC
  Administered 2016-09-14: 25 mL via INTRAVENOUS
  Filled 2016-09-14: qty 50

## 2016-09-14 MED ORDER — GABAPENTIN 300 MG PO CAPS
300.0000 mg | ORAL_CAPSULE | Freq: Every day | ORAL | Status: DC
  Administered 2016-09-15 – 2016-09-19 (×4): 300 mg via ORAL
  Filled 2016-09-14 (×4): qty 1

## 2016-09-14 MED ORDER — PANTOPRAZOLE SODIUM 40 MG IV SOLR: 40.0000 mg | Freq: Two times a day (BID) | INTRAVENOUS | Status: DC

## 2016-09-14 MED ORDER — PANTOPRAZOLE SODIUM 40 MG IV SOLR
40.0000 mg | Freq: Two times a day (BID) | INTRAVENOUS | Status: DC
  Administered 2016-09-14: 40 mg via INTRAVENOUS
  Filled 2016-09-14: qty 40

## 2016-09-14 MED ORDER — PANTOPRAZOLE SODIUM 40 MG PO TBEC: 40.0000 mg | DELAYED_RELEASE_TABLET | Freq: Two times a day (BID) | ORAL | Status: DC

## 2016-09-14 MED ORDER — SODIUM CHLORIDE 0.9 % IV SOLN
INTRAVENOUS | Status: DC
  Administered 2016-09-14 – 2016-09-15 (×2): via INTRAVENOUS

## 2016-09-14 MED ORDER — POTASSIUM CHLORIDE 10 MEQ/100ML IV SOLN
10.0000 meq | INTRAVENOUS | Status: AC
  Administered 2016-09-14 (×4): 10 meq via INTRAVENOUS
  Filled 2016-09-14 (×4): qty 100

## 2016-09-14 MED ORDER — TAMSULOSIN HCL 0.4 MG PO CAPS
0.4000 mg | ORAL_CAPSULE | Freq: Every day | ORAL | Status: DC
  Administered 2016-09-14 – 2016-09-19 (×6): 0.4 mg via ORAL
  Filled 2016-09-14 (×7): qty 1

## 2016-09-14 MED ORDER — ONDANSETRON HCL 4 MG PO TABS
4.0000 mg | ORAL_TABLET | Freq: Four times a day (QID) | ORAL | Status: DC | PRN
  Filled 2016-09-14: qty 1

## 2016-09-14 MED ORDER — MAGNESIUM OXIDE 400 (241.3 MG) MG PO TABS
400.0000 mg | ORAL_TABLET | Freq: Every day | ORAL | Status: DC
  Administered 2016-09-14 – 2016-09-19 (×6): 400 mg via ORAL
  Filled 2016-09-14 (×6): qty 1

## 2016-09-14 MED ORDER — DEXTROSE 50 % IV SOLN: 25.0000 mL | Freq: Once | INTRAVENOUS | Status: AC

## 2016-09-14 MED ORDER — FERROUS SULFATE 325 (65 FE) MG PO TABS
325.0000 mg | ORAL_TABLET | Freq: Every day | ORAL | Status: DC
  Administered 2016-09-14 – 2016-09-19 (×5): 325 mg via ORAL
  Filled 2016-09-14 (×6): qty 1

## 2016-09-14 MED ORDER — ENOXAPARIN SODIUM 40 MG/0.4ML ~~LOC~~ SOLN: 40.0000 mg | SUBCUTANEOUS | Status: DC

## 2016-09-14 MED ORDER — POLYETHYLENE GLYCOL 3350 17 G PO PACK
17.0000 g | PACK | Freq: Every day | ORAL | Status: DC
  Administered 2016-09-14 – 2016-09-19 (×5): 17 g via ORAL
  Filled 2016-09-14 (×6): qty 1

## 2016-09-14 MED ORDER — SODIUM CHLORIDE 0.9 % IV SOLN
80.0000 mg | Freq: Once | INTRAVENOUS | Status: AC
  Administered 2016-09-14: 15:00:00 80 mg via INTRAVENOUS
  Filled 2016-09-14: qty 80

## 2016-09-14 MED ORDER — METOCLOPRAMIDE HCL 5 MG PO TABS
5.0000 mg | ORAL_TABLET | Freq: Two times a day (BID) | ORAL | Status: DC
  Administered 2016-09-15 – 2016-09-19 (×9): 5 mg via ORAL
  Filled 2016-09-14 (×13): qty 1

## 2016-09-14 MED ORDER — SODIUM CHLORIDE 0.9 % IV SOLN
8.0000 mg/h | INTRAVENOUS | Status: DC
  Administered 2016-09-14 – 2016-09-15 (×2): 8 mg/h via INTRAVENOUS
  Filled 2016-09-14 (×2): qty 80

## 2016-09-14 MED ORDER — DEXTROSE 50 % IV SOLN
1.0000 | Freq: Once | INTRAVENOUS | Status: AC
  Administered 2016-09-14: 25 mL via INTRAVENOUS

## 2016-09-14 MED ORDER — ACETAMINOPHEN 650 MG RE SUPP: 650.0000 mg | Freq: Four times a day (QID) | RECTAL | Status: DC | PRN

## 2016-09-14 MED ORDER — APIXABAN 2.5 MG PO TABS
2.5000 mg | ORAL_TABLET | Freq: Two times a day (BID) | ORAL | Status: DC
  Administered 2016-09-14: 2.5 mg via ORAL
  Filled 2016-09-14: qty 1

## 2016-09-14 MED ORDER — LEVOTHYROXINE SODIUM 50 MCG PO TABS
50.0000 ug | ORAL_TABLET | Freq: Every day | ORAL | Status: DC
Start: 2016-09-14 — End: 2016-09-19
  Administered 2016-09-14 – 2016-09-19 (×6): 50 ug via ORAL
  Filled 2016-09-14: qty 2
  Filled 2016-09-14 (×5): qty 1

## 2016-09-14 MED ORDER — OXYCODONE-ACETAMINOPHEN 7.5-325 MG PO TABS: 1.0000 | ORAL_TABLET | Freq: Two times a day (BID) | ORAL | Status: DC | PRN

## 2016-09-14 MED ORDER — SERTRALINE HCL 50 MG PO TABS
25.0000 mg | ORAL_TABLET | Freq: Every day | ORAL | Status: DC
  Administered 2016-09-14 – 2016-09-16 (×2): 25 mg via ORAL
  Administered 2016-09-17: 10:00:00 50 mg via ORAL
  Administered 2016-09-18 – 2016-09-19 (×2): 25 mg via ORAL
  Filled 2016-09-14 (×6): qty 1

## 2016-09-14 MED ORDER — SUCRALFATE 1 GM/10ML PO SUSP
1.0000 g | Freq: Three times a day (TID) | ORAL | Status: DC
  Administered 2016-09-14 – 2016-09-19 (×18): 1 g via ORAL
  Filled 2016-09-14 (×19): qty 10

## 2016-09-14 MED ORDER — DOCUSATE SODIUM 100 MG PO CAPS: 100.0000 mg | ORAL_CAPSULE | Freq: Two times a day (BID) | ORAL | Status: DC

## 2016-09-14 MED ORDER — AMIODARONE HCL 200 MG PO TABS
200.0000 mg | ORAL_TABLET | Freq: Every day | ORAL | Status: DC
  Administered 2016-09-14 – 2016-09-19 (×6): 200 mg via ORAL
  Filled 2016-09-14 (×6): qty 1

## 2016-09-14 MED ORDER — ATORVASTATIN CALCIUM 20 MG PO TABS
20.0000 mg | ORAL_TABLET | Freq: Every day | ORAL | Status: DC
  Administered 2016-09-15 – 2016-09-19 (×4): 20 mg via ORAL
  Filled 2016-09-14 (×4): qty 1

## 2016-09-14 MED ORDER — DEXTROSE 50 % IV SOLN
25.0000 mL | Freq: Once | INTRAVENOUS | Status: AC
  Administered 2016-09-14: 25 mL via INTRAVENOUS

## 2016-09-14 MED ORDER — ADULT MULTIVITAMIN W/MINERALS CH
1.0000 | ORAL_TABLET | Freq: Every day | ORAL | Status: DC
  Administered 2016-09-14 – 2016-09-19 (×5): 1 via ORAL
  Filled 2016-09-14 (×6): qty 1

## 2016-09-14 MED ORDER — ONDANSETRON HCL 4 MG/2ML IJ SOLN
4.0000 mg | Freq: Four times a day (QID) | INTRAMUSCULAR | Status: DC | PRN
Start: 2016-09-14 — End: 2016-09-19
  Administered 2016-09-14 – 2016-09-15 (×3): 4 mg via INTRAVENOUS
  Filled 2016-09-14 (×3): qty 2

## 2016-09-14 NOTE — Progress Notes (Signed)
Patient ID: Sherry [name], female   DOB: 07/31/1927, 83 y.o.   MRN: [id number]  [facility] PROGRESS NOTE  SHEREDA [name] MPN:[id number] DOB: [birth year] DOA: 09/13/2016 PCP: [name], MD  HPI/Subjective: Patient seen and examined. She complains of chest pain and abdominal pain. She is vomiting up blackish material. She is not feeling well at all.  Objective: Vitals:   09/14/16 1100 09/14/16 1200  BP: (!) 183/64 (!) 178/63  Pulse: 76 77  Resp: (!) 21 19  Temp: 98.1 F (36.7 C) 98.6 F (37 C)  SpO2: 99% 98%    Filed Weights   09/13/16 2155 09/14/16 0215  Weight: 65.8 kg (145 lb) 57.4 kg (126 lb 8.7 oz)    ROS: Review of Systems  Constitutional: Negative for chills and fever.  Eyes: Negative for blurred vision.  Respiratory: Positive for shortness of breath. Negative for cough.   Cardiovascular: Positive for chest pain.  Gastrointestinal: Positive for abdominal pain, nausea and vomiting. Negative for constipation and diarrhea.  Genitourinary: Negative for dysuria.  Musculoskeletal: Negative for joint pain.  Neurological: Negative for dizziness and headaches.   Exam: Physical Exam  HENT:  Nose: No mucosal edema.  Mouth/Throat: No oropharyngeal exudate or posterior oropharyngeal edema.  Eyes: Pupils are equal, round, and reactive to light. Conjunctivae and lids are normal.  Neck: No JVD present. Carotid bruit is not present. No edema present. No thyroid mass and no thyromegaly present.  Cardiovascular: S1 normal, S2 normal and normal heart sounds.  Exam reveals no gallop.   No murmur heard. Pulses:      Dorsalis pedis pulses are 1+ on the right side, and 1+ on the left side.  Respiratory: No respiratory distress. She has decreased breath sounds in the right lower field and the left lower field. She has no wheezes. She has rhonchi in the right lower field and the left lower field. She has no rales.  GI: Soft. Bowel sounds are normal. There is tenderness in the  epigastric area.  Musculoskeletal:       Right ankle: She exhibits swelling.       Left ankle: She exhibits swelling.  Lymphadenopathy:    She has no cervical adenopathy.  Neurological: She is alert. No cranial nerve deficit.  Skin: Skin is warm. No rash noted. Nails show no clubbing.  Psychiatric: She has a normal mood and affect.      Data Reviewed: Basic Metabolic Panel:  Recent Labs Lab 09/13/16 2200 09/13/16 2307 09/14/16 1032  NA 138  --   --   K 2.5*  --  4.0  CL 105  --   --   CO2 23  --   --   GLUCOSE 143*  --   --   BUN 12  --   --   CREATININE 0.96  --   --   CALCIUM 8.4*  --   --   MG  --  1.6*  --    Liver Function Tests:  Recent Labs Lab 09/13/16 2200  AST 28  ALT 13*  ALKPHOS 86  BILITOT 0.6  PROT 6.9  ALBUMIN 3.2*   CBC:  Recent Labs Lab 09/13/16 2307  WBC 5.7  HGB 12.2  HCT 37.1  MCV 83.9  PLT 268   Cardiac Enzymes:  Recent Labs Lab 09/13/16 2307 09/14/16 1032  TROPONINI <0.03 0.41*   BNP (last 3 results)  Recent Labs  03/07/16 1559 08/21/16 0241 09/05/16 2002  BNP 87.0 94.0 248.0*  CBG:  Recent Labs Lab 09/14/16 0808 09/14/16 0809 09/14/16 0848 09/14/16 0950 09/14/16 1250  GLUCAP 57* 49* 158* 133* 133*    Recent Results (from the past 240 hour(s))  Urine culture     Status: None   Collection Time: 09/05/16  8:02 PM  Result Value Ref Range Status   Specimen Description URINE, CATHETERIZED  Final   Special Requests NONE  Final   Culture   Final    NO GROWTH Performed at [facility], [place] [address], [place], [place] [address]    Report Status 09/07/2016 FINAL  Final  MRSA PCR Screening     Status: None   Collection Time: 09/14/16  2:12 AM  Result Value Ref Range Status   MRSA by PCR NEGATIVE NEGATIVE Final    Comment:        The GeneXpert MRSA Assay (FDA approved for NASAL specimens only), is one component of a comprehensive MRSA colonization surveillance program. It is not intended to  diagnose MRSA infection nor to guide or monitor treatment for MRSA infections.      Studies: Dg Chest Port 1 View  Result Date: 09/13/2016 CLINICAL DATA:  Sepsis. EXAM: PORTABLE CHEST 1 VIEW COMPARISON:  Most recent chest radiograph 08/28/2016 FINDINGS: Unchanged heart size and mediastinal contours. Improved bibasilar aeration with decreasing pleural effusions and atelectasis. No pulmonary edema. No pneumothorax, skin fold projects over the right chest. No confluent airspace disease or pneumothorax. Bones are under mineralized with chronic degenerative change of the shoulders. IMPRESSION: Improved bibasilar aeration with decreasing pleural effusions and bibasilar atelectasis. Electronically Signed   By: Jeb Levering M.D.   On: 09/13/2016 23:01    Scheduled Meds: . amiodarone  200 mg Oral Daily  . apixaban  2.5 mg Oral BID  . atorvastatin  20 mg Oral QHS  . docusate sodium  100 mg Oral BID  . ferrous sulfate  325 mg Oral Q breakfast  . gabapentin  300 mg Oral QHS  . levothyroxine  50 mcg Oral QAC breakfast  . magnesium oxide  400 mg Oral Daily  . megestrol  40 mg Oral Daily  . metoCLOPramide  5 mg Oral BID  . multivitamin with minerals  1 tablet Oral Daily  . pantoprazole (PROTONIX) IV  40 mg Intravenous Q12H  . polyethylene glycol  17 g Oral Daily  . sertraline  25 mg Oral Daily  . sucralfate  1 g Oral TID WC & HS  . tamsulosin  0.4 mg Oral Daily   Continuous Infusions: . sodium chloride 50 mL/hr at 09/14/16 1200  . dextrose 150 mL/hr at 09/14/16 1200    Assessment/Plan:  1. Severe hypoglycemia on D10 drip at 150 mL per hour. If neck sugars okay then hopefully can decrease the rate. Would recommending discontinuing the sodium chloride. 2. Upper GI bleed with hematemesis. Case discussed with critical care specialist. The patient is still on Eliquis. I recommend holding this at this point. I recommend serial hemoglobins.  Protonix IV. 3. Hypothermia.  Pro calcitonin negative  so less likely infection. 4. History of a arrhythmia and pulmonary embolism. Recommend stopping Eliquis with hematemesis. On amiodarone 5. Diabetes with neuropathy on gabapentin 6. Hypothyroidism unspecified on levothyroxine 7. Hyperlipidemia unspecified on Lipitor 8. Chronic kidney disease stage III 9. Severe hypokalemia now replaced 10. Hypomagnesemia replaced 11. Lactic acidosis improved with IV fluids  Case discussed with critical care specialist Hinton Dyer to reevaluate the patient.  Code Status:     Code Status Orders  Start     Ordered   09/14/16 0218  Full code  Continuous     09/14/16 0217    Code Status History    Date Active Date Inactive Code Status Order ID Comments User Context   09/06/2016 12:38 AM 09/07/2016  8:31 PM Full Code [id number]  Lance Coon, MD Inpatient   08/29/2016  4:44 PM 08/31/2016  8:01 PM Full Code [id number]  Hillary Bow, MD ED   08/21/2016  6:15 AM 08/22/2016  7:28 PM Full Code [id number]  Lance Coon, MD Inpatient   07/28/2016  9:06 PM 08/02/2016  8:04 PM Full Code [id number]  Lance Coon, MD Inpatient   07/22/2016  4:13 AM 07/24/2016  5:43 PM Full Code [id number]  Harrie Foreman, MD Inpatient   07/12/2016 10:45 PM 07/18/2016  8:32 PM Full Code [id number]  Hugelmeyer, Alexis, DO Inpatient   05/28/2016 12:01 PM 06/02/2016  7:32 PM Full Code [id number]  Lorella Nimrod, MD Inpatient   04/24/2016  7:27 PM 04/27/2016  8:08 PM Full Code [id number]  Loletha Grayer, MD ED   04/19/2016 12:50 AM 04/22/2016 12:10 AM Full Code [id number]  Lance Coon, MD Inpatient   03/27/2016  9:39 PM 03/29/2016  9:41 PM Full Code [id number]  Fritzi Mandes, MD ED   03/12/2016 12:21 PM 03/13/2016  8:32 PM Full Code [id number]  Dustin Flock, MD Inpatient   03/09/2016  4:33 PM 03/12/2016 12:21 PM DNR [id number]  Nicholes Mango, MD ED   02/26/2016  9:39 PM 03/01/2016 10:37 PM DNR [id number]  Karmen Bongo, MD Inpatient   02/26/2016  7:48 PM 02/26/2016  9:39 PM DNR [id number]  Dorie Rank, MD ED    01/31/2016  5:45 PM 02/07/2016 11:45 PM DNR [id number]  Flora Lipps, MD Inpatient   01/30/2016 11:19 PM 01/31/2016  5:44 PM Full Code [id number]  Hugelmeyer, Ubaldo Glassing, DO Inpatient   11/26/2014  3:52 PM 11/28/2014  3:07 PM Full Code [id number]  Max Sane, MD Inpatient   11/26/2014  8:59 AM 11/26/2014  3:52 PM Full Code [id number]  Max Sane, MD ED     Family Communication: as per critical care specialist Disposition Plan: I asked the critical care specialist to reevaluate prior to sending out to the floor.  Consultants:  Critical care specialist  Gastroenterology  Palliative care  Time spent: Full code  [facility]

## 2016-09-14 NOTE — ED Notes (Signed)
bearhugger remains in place on high

## 2016-09-14 NOTE — Progress Notes (Signed)
[facility] Progress Note Patient Name: Sherry [name] DOB: 1927-03-10 MRN: [id number]   Date of Service  09/14/2016  HPI/Events of Note  42 F brought by EMS to [facility] ED with AMS and hypoglycemia with CBG of 17.  Given amps of dextrose and now on D10 infusion.  This is one of multiple admissions for hypoglycemia.  She has poor po intake at home.  On camera check the patient is resting comfortably.  She is HD stable with sats of 100%.  eICU Interventions  Continue with D10 infusion Plan of care per primary admitting team. PCCM to follow     Intervention Category Evaluation Type: New Patient Evaluation  Wess Baney 09/14/2016, 2:39 AM

## 2016-09-14 NOTE — Progress Notes (Signed)
[place] NP made aware of CBG of 54 at 0347. Half an amp of D50 given. CBG 97 at recheck. Orders to increase D10 infusion to 125 ml/hr.

## 2016-09-14 NOTE — Progress Notes (Signed)
Lab called with a critical troponin of .41. Hinton Dyer, NP notified of same. No new orders at this time.

## 2016-09-14 NOTE — Consult Note (Signed)
Sherry Lame, MD [facility]  [address], [place], [place] [address] Phone: [phone] Fax : [phone]  Consultation  Referring Provider:     Dr. Marcille Blanco Primary Care Physician:  [name], MD Primary Gastroenterologist:  Dr. Watt Climes         Reason for Consultation:     Hematemesis  Date of Admission:  09/13/2016 Date of Consultation:  09/14/2016         HPI:   Sherry [name] is a 83 y.o. female who is reported to have hematemesis on admission.  The patient had a similar issue back in May and had an upper endoscopy with severe esophagitis seen at that time.  [name] hemoglobin back in July was 12.3 with is going down to 10.8 in August.  [name] hemoglobin was checked again at the end of August and was 9.6.  The patient was now admitted with a hemoglobin of 12.2 which happens to be 9.8 this morning.  The nursing staff did not report any sign of any GI bleeding and states that she has had a few episodes of vomiting with only clear material and no signs of any bleeding.  The nursing staff also denies the patient has any black stools or bloody stools.  The patient is not able to give much of a history when asked questions.  Past Medical History:  Diagnosis Date  . Anxiety   . Arthritis   . Chronic kidney disease   . Diabetes mellitus without complication ([place])   . Dizziness   . Dyspnea   . GERD (gastroesophageal reflux disease)   . Hyperlipidemia   . Hypertension   . Pulmonary embolism ([place]) 2016    Past Surgical History:  Procedure Laterality Date  . APPENDECTOMY    . BACK SURGERY    . ESOPHAGOGASTRODUODENOSCOPY N/A 05/29/2016   Procedure: ESOPHAGOGASTRODUODENOSCOPY (EGD);  Surgeon: Clarene Essex, MD;  Location: [facility];  Service: Gastroenterology;  Laterality: N/A;  . SHOULDER SURGERY Left     Prior to Admission medications   Medication Sig Start Date End Date Taking? Authorizing Provider  amiodarone (PACERONE) 200 MG tablet Take 1 tablet (200 mg  total) by mouth daily. 09/08/16  Yes Vaughan Basta, MD  apixaban (ELIQUIS) 2.5 MG TABS tablet Take 1 tablet (2.5 mg total) by mouth 2 (two) times daily. 07/23/16  Yes Gladstone Lighter, MD  atorvastatin (LIPITOR) 20 MG tablet Take 20 mg by mouth at bedtime.    Yes [provider]  docusate sodium (COLACE) 100 MG capsule Take 1 capsule (100 mg total) by mouth 2 (two) times daily. 08/02/16  Yes Gladstone Lighter, MD  ferrous sulfate 325 (65 FE) MG tablet Take 1 tablet (325 mg total) by mouth daily with breakfast. 06/03/16  Yes Lorella Nimrod, MD  gabapentin (NEURONTIN) 300 MG capsule Take 300 mg by mouth at bedtime.    Yes [provider]  magnesium oxide (MAG-OX) 400 (241.3 Mg) MG tablet Take 1 tablet (400 mg total) by mouth daily. 04/27/16  Yes Wieting, Richard, MD  megestrol (MEGACE) 40 MG tablet Take 1 tablet (40 mg total) by mouth daily. 08/02/16  Yes Gladstone Lighter, MD  metoCLOPramide (REGLAN) 5 MG tablet Take 1 tablet (5 mg total) by mouth 2 (two) times daily. 06/02/16  Yes Lorella Nimrod, MD  Multiple Vitamin (MULTIVITAMIN WITH MINERALS) TABS tablet Take 1 tablet by mouth daily. 06/02/16  Yes Shela Leff, MD  ondansetron (ZOFRAN-ODT) 4 MG disintegrating tablet Take 4 mg by mouth  every 6 (six) hours as needed for nausea or vomiting.   Yes [provider]  oxyCODONE-acetaminophen (PERCOCET) 7.5-325 MG tablet Take 1 tablet by mouth 2 (two) times daily as needed for severe pain. 08/08/16  Yes [provider]  pantoprazole (PROTONIX) 40 MG tablet Take 1 tablet (40 mg total) by mouth 2 (two) times daily. 04/27/16  Yes Wieting, Richard, MD  polyethylene glycol [facility]) packet Take 17 g by mouth daily. 08/02/16  Yes Gladstone Lighter, MD  sertraline (ZOLOFT) 25 MG tablet Take 1 tablet (25 mg total) by mouth daily. 06/02/16  Yes Lorella Nimrod, MD  sucralfate (CARAFATE) 1 GM/10ML suspension Take 10 mLs (1 g total) by mouth 4 (four) times daily -  with meals  and at bedtime. 06/02/16  Yes Lorella Nimrod, MD  tamsulosin (FLOMAX) 0.4 MG CAPS capsule Take 1 capsule (0.4 mg total) by mouth daily. Patient taking differently: Take 0.4 mg by mouth daily after lunch.  03/14/16  Yes Fritzi Mandes, MD    Family History  Problem Relation Age of Onset  . Diabetes Mother   . Cancer Mother   . Cancer Father   . Bladder Cancer Neg Hx   . Kidney cancer Neg Hx      Social History  Substance Use Topics  . Smoking status: Never Smoker  . Smokeless tobacco: Never Used  . Alcohol use No    Allergies as of 09/13/2016  . (No Known Allergies)    Review of Systems:    All systems reviewed and negative except where noted in HPI.   Physical Exam:  Vital signs in last 24 hours: Temp:  [90.1 F (32.3 C)-100 F (37.8 C)] 100 F (37.8 C) (09/06 1930) Pulse Rate:  [30-94] 87 (09/06 1930) Resp:  [14-21] 18 (09/06 1930) BP: (127-191)/(50-92) 127/51 (09/06 1930) SpO2:  [95 %-100 %] 98 % (09/06 1930) Weight:  [126 lb 8.7 oz (57.4 kg)-145 lb (65.8 kg)] 126 lb 8.7 oz (57.4 kg) (09/06 0215)   General:   Pleasant, NAD Head:  Normocephalic and atraumatic. Eyes:   No icterus.   Conjunctiva pink. PERRLA. Ears:  Normal auditory acuity. Neck:  Supple; no masses or thyroidomegaly Lungs: Respirations even and unlabored. Lungs clear to auscultation bilaterally.   No wheezes, crackles, or rhonchi.  Heart:  Regular rate and rhythm;  Without murmur, clicks, rubs or gallops Abdomen:  Soft, nondistended, nontender. Normal bowel sounds. No appreciable masses or hepatomegaly.  No rebound or guarding.  Rectal:  Not performed. Msk:  Symmetrical without gross deformities.   Extremities:  Without edema, cyanosis or clubbing. Neurologic:  Somnolent;  grossly normal neurologically. Skin:  Intact without significant lesions or rashes. Cervical Nodes:  No significant cervical adenopathy. Psych:  Alert but notcooperative.   LAB RESULTS:  Recent Labs  09/13/16 2307 09/14/16 1446    WBC 5.7 8.5  HGB 12.2 9.8*  HCT 37.1 28.2*  PLT 268 230   BMET  Recent Labs  09/13/16 2200 09/14/16 1032  NA 138  --   K 2.5* 4.0  CL 105  --   CO2 23  --   GLUCOSE 143*  --   BUN 12  --   CREATININE 0.96  --   CALCIUM 8.4*  --    LFT  Recent Labs  09/13/16 2200  PROT 6.9  ALBUMIN 3.2*  AST 28  ALT 13*  ALKPHOS 86  BILITOT 0.6   PT/INR  Recent Labs  09/14/16 1446  LABPROT 12.2  INR 0.91  STUDIES: Dg Chest Port 1 View  Result Date: 09/13/2016 CLINICAL DATA:  Sepsis. EXAM: PORTABLE CHEST 1 VIEW COMPARISON:  Most recent chest radiograph 08/28/2016 FINDINGS: Unchanged heart size and mediastinal contours. Improved bibasilar aeration with decreasing pleural effusions and atelectasis. No pulmonary edema. No pneumothorax, skin fold projects over the right chest. No confluent airspace disease or pneumothorax. Bones are under mineralized with chronic degenerative change of the shoulders. IMPRESSION: Improved bibasilar aeration with decreasing pleural effusions and bibasilar atelectasis. Electronically Signed   By: Jeb Levering M.D.   On: 09/13/2016 23:01      Impression / Plan:   Sherry [name] is a 83 y.o. y/o female with A hemoglobin that has been going up and down over the last week.  [name] hemoglobin a week ago was 9.6 with it being 12.2 yesterday and then again 9.8 today.  Although the patient was reported to have some hematemesis on admission the patient has had some vomiting but no sign of any blood in her vomitus or passing of blood from her rectum.  The patient did have an upper endoscopy back in May that showed her to have severe esophagitis.  The patient should be treated with a PPI and I do not recommend any endoscopic procedures at this time.   Thank you for involving me in the care of this patient.      LOS: 1 day   Sherry Lame, MD  09/14/2016, 8:24 PM   Note: This dictation was prepared with Dragon dictation along with smaller phrase  technology. Any transcriptional errors that result from this process are unintentional.

## 2016-09-14 NOTE — Progress Notes (Addendum)
Inpatient Diabetes Program Recommendations  AACE/ADA: New Consensus Statement on Inpatient Glycemic Control (2015)  Target Ranges:  Prepandial:   less than 140 mg/dL      Peak postprandial:   less than 180 mg/dL (1-2 hours)      Critically ill patients:  140 - 180 mg/dL   Lab Results  Component Value Date   GLUCAP 133 (H) 09/14/2016   HGBA1C 6.9 (H) 07/22/2016   Inpatient Diabetes Program Recommendations:    Spoke with patient during last admission regarding hypoglycemia.  Of note, insulin was d/c'd during last admission and d/c'd on discharge summary.  Based on d/c summary, patient should not have been on any medications for diabetes prior to admit. Discussed with RN.    Thanks, Adah Perl, RN, BC-ADM Inpatient Diabetes Coordinator Pager [phone] (8a-5p)

## 2016-09-14 NOTE — H&P (Addendum)
Sherry [name] is an 83 y.o. female.   Chief Complaint: Altered mental status HPI: The patient presents emergency department with altered mental status from home. [name] family states that she has become less responsive and confused when she is awake. She is found to be hypothermic in the emergency department to a rectal temperature of 53F. She was immediately placed on a warming blanket. Her blood sugar was as low as 67. She received 1.5 A of the 50 in the emergency department but continued to require a D10 drip. Prior to admission patient had an episode of hematemesis. Once the patient was stabilized emergency department staff called the hospitalist service for further management.  Past Medical History:  Diagnosis Date  . Anxiety   . Arthritis   . Chronic kidney disease   . Diabetes mellitus without complication ([place])   . Dizziness   . Dyspnea   . GERD (gastroesophageal reflux disease)   . Hyperlipidemia   . Hypertension   . Pulmonary embolism ([place]) 2016    Past Surgical History:  Procedure Laterality Date  . APPENDECTOMY    . BACK SURGERY    . ESOPHAGOGASTRODUODENOSCOPY N/A 05/29/2016   Procedure: ESOPHAGOGASTRODUODENOSCOPY (EGD);  Surgeon: Clarene Essex, MD;  Location: [facility];  Service: Gastroenterology;  Laterality: N/A;  . SHOULDER SURGERY Left     Family History  Problem Relation Age of Onset  . Diabetes Mother   . Cancer Mother   . Cancer Father   . Bladder Cancer Neg Hx   . Kidney cancer Neg Hx    Social History:  reports that she has never smoked. She has never used smokeless tobacco. She reports that she does not drink alcohol or use drugs.  Allergies: No Known Allergies  Medications Prior to Admission  Medication Sig Dispense Refill  . amiodarone (PACERONE) 200 MG tablet Take 1 tablet (200 mg total) by mouth daily. 30 tablet 0  . apixaban (ELIQUIS) 2.5 MG TABS tablet Take 1 tablet (2.5 mg total) by mouth 2 (two) times daily. 60 tablet 2  . atorvastatin  (LIPITOR) 20 MG tablet Take 20 mg by mouth at bedtime.     . docusate sodium (COLACE) 100 MG capsule Take 1 capsule (100 mg total) by mouth 2 (two) times daily. 10 capsule 0  . ferrous sulfate 325 (65 FE) MG tablet Take 1 tablet (325 mg total) by mouth daily with breakfast. 60 tablet 3  . gabapentin (NEURONTIN) 300 MG capsule Take 300 mg by mouth at bedtime.     . magnesium oxide (MAG-OX) 400 (241.3 Mg) MG tablet Take 1 tablet (400 mg total) by mouth daily. 30 tablet 0  . megestrol (MEGACE) 40 MG tablet Take 1 tablet (40 mg total) by mouth daily. 30 tablet 2  . metoCLOPramide (REGLAN) 5 MG tablet Take 1 tablet (5 mg total) by mouth 2 (two) times daily. 60 tablet 2  . Multiple Vitamin (MULTIVITAMIN WITH MINERALS) TABS tablet Take 1 tablet by mouth daily. 30 tablet 2  . ondansetron (ZOFRAN-ODT) 4 MG disintegrating tablet Take 4 mg by mouth every 6 (six) hours as needed for nausea or vomiting.    Marland Kitchen oxyCODONE-acetaminophen (PERCOCET) 7.5-325 MG tablet Take 1 tablet by mouth 2 (two) times daily as needed for severe pain.  0  . pantoprazole (PROTONIX) 40 MG tablet Take 1 tablet (40 mg total) by mouth 2 (two) times daily. 60 tablet 0  . polyethylene glycol (MIRALAX) packet Take 17 g by mouth daily. 30 each 0  .  sertraline (ZOLOFT) 25 MG tablet Take 1 tablet (25 mg total) by mouth daily. 30 tablet 2  . sucralfate (CARAFATE) 1 GM/10ML suspension Take 10 mLs (1 g total) by mouth 4 (four) times daily -  with meals and at bedtime. 420 mL 0  . tamsulosin (FLOMAX) 0.4 MG CAPS capsule Take 1 capsule (0.4 mg total) by mouth daily. (Patient taking differently: Take 0.4 mg by mouth daily after lunch. ) 30 capsule 0    Results for orders placed or performed during the hospital encounter of 09/13/16 (from the past 48 hour(s))  Glucose, capillary     Status: Abnormal   Collection Time: 09/13/16  9:50 PM  Result Value Ref Range   Glucose-Capillary 145 (H) 65 - 99 mg/dL  Comprehensive metabolic panel     Status:  Abnormal   Collection Time: 09/13/16 10:00 PM  Result Value Ref Range   Sodium 138 135 - 145 mmol/L   Potassium 2.5 (LL) 3.5 - 5.1 mmol/L    Comment: HEMOLYSIS AT THIS LEVEL MAY AFFECT RESULT CRITICAL RESULT CALLED TO, READ BACK BY AND VERIFIED WITH ALLISON PATE AT 2236 09/13/16 ALV    Chloride 105 101 - 111 mmol/L   CO2 23 22 - 32 mmol/L   Glucose, Bld 143 (H) 65 - 99 mg/dL   BUN 12 6 - 20 mg/dL   Creatinine, Ser 0.96 0.44 - 1.00 mg/dL   Calcium 8.4 (L) 8.9 - 10.3 mg/dL   Total Protein 6.9 6.5 - 8.1 g/dL   Albumin 3.2 (L) 3.5 - 5.0 g/dL   AST 28 15 - 41 U/L   ALT 13 (L) 14 - 54 U/L   Alkaline Phosphatase 86 38 - 126 U/L   Total Bilirubin 0.6 0.3 - 1.2 mg/dL   GFR calc non Af Amer 52 (L) >60 mL/min   GFR calc Af Amer >60 >60 mL/min    Comment: (NOTE) The eGFR has been calculated using the CKD EPI equation. This calculation has not been validated in all clinical situations. eGFR's persistently <60 mL/min signify possible Chronic Kidney Disease.    Anion gap 10 5 - 15  Glucose, capillary     Status: None   Collection Time: 09/13/16 10:35 PM  Result Value Ref Range   Glucose-Capillary 83 65 - 99 mg/dL  Lactic acid, plasma     Status: Abnormal   Collection Time: 09/13/16 11:07 PM  Result Value Ref Range   Lactic Acid, Venous 2.2 (HH) 0.5 - 1.9 mmol/L    Comment: CRITICAL RESULT CALLED TO, READ BACK BY AND VERIFIED WITH ALLISON PATE AT 2353 09/13/16 ALV   Urinalysis, Complete w Microscopic     Status: Abnormal   Collection Time: 09/13/16 11:07 PM  Result Value Ref Range   Color, Urine YELLOW (A) YELLOW   APPearance CLEAR (A) CLEAR   Specific Gravity, Urine 1.012 1.005 - 1.030   pH 5.0 5.0 - 8.0   Glucose, UA 50 (A) NEGATIVE mg/dL   Hgb urine dipstick MODERATE (A) NEGATIVE   Bilirubin Urine NEGATIVE NEGATIVE   Ketones, ur NEGATIVE NEGATIVE mg/dL   Protein, ur 30 (A) NEGATIVE mg/dL   Nitrite NEGATIVE NEGATIVE   Leukocytes, UA NEGATIVE NEGATIVE   RBC / HPF TOO NUMEROUS TO  COUNT 0 - 5 RBC/hpf   WBC, UA 0-5 0 - 5 WBC/hpf   Bacteria, UA NONE SEEN NONE SEEN   Squamous Epithelial / LPF NONE SEEN NONE SEEN   Mucus PRESENT    Hyaline Casts, UA PRESENT  CBC     Status: Abnormal   Collection Time: 09/13/16 11:07 PM  Result Value Ref Range   WBC 5.7 3.6 - 11.0 K/uL   RBC 4.42 3.80 - 5.20 MIL/uL   Hemoglobin 12.2 12.0 - 16.0 g/dL   HCT 37.1 35.0 - 47.0 %   MCV 83.9 80.0 - 100.0 fL   MCH 27.7 26.0 - 34.0 pg   MCHC 33.0 32.0 - 36.0 g/dL   RDW 16.1 (H) 11.5 - 14.5 %   Platelets 268 150 - 440 K/uL  Procalcitonin     Status: None   Collection Time: 09/13/16 11:07 PM  Result Value Ref Range   Procalcitonin <0.10 ng/mL    Comment:        Interpretation: PCT (Procalcitonin) <= 0.5 ng/mL: Systemic infection (sepsis) is not likely. Local bacterial infection is possible. (NOTE)         ICU PCT Algorithm               Non ICU PCT Algorithm    ----------------------------     ------------------------------         PCT < 0.25 ng/mL                 PCT < 0.1 ng/mL     Stopping of antibiotics            Stopping of antibiotics       strongly encouraged.               strongly encouraged.    ----------------------------     ------------------------------       PCT level decrease by               PCT < 0.25 ng/mL       >= 80% from peak PCT       OR PCT 0.25 - 0.5 ng/mL          Stopping of antibiotics                                             encouraged.     Stopping of antibiotics           encouraged.    ----------------------------     ------------------------------       PCT level decrease by              PCT >= 0.25 ng/mL       < 80% from peak PCT        AND PCT >= 0.5 ng/mL            Continuin g antibiotics                                              encouraged.       Continuing antibiotics            encouraged.    ----------------------------     ------------------------------     PCT level increase compared          PCT > 0.5 ng/mL         with peak  PCT AND          PCT >= 0.5 ng/mL  Escalation of antibiotics                                          strongly encouraged.      Escalation of antibiotics        strongly encouraged.   T4, free     Status: Abnormal   Collection Time: 09/13/16 11:07 PM  Result Value Ref Range   Free T4 1.36 (H) 0.61 - 1.12 ng/dL    Comment: (NOTE) Biotin ingestion may interfere with free T4 tests. If the results are inconsistent with the TSH level, previous test results, or the clinical presentation, then consider biotin interference. If needed, order repeat testing after stopping biotin.   Troponin I     Status: None   Collection Time: 09/13/16 11:07 PM  Result Value Ref Range   Troponin I <0.03 <0.03 ng/mL  TSH     Status: Abnormal   Collection Time: 09/13/16 11:07 PM  Result Value Ref Range   TSH 8.986 (H) 0.350 - 4.500 uIU/mL    Comment: Performed by a 3rd Generation assay with a functional sensitivity of <=0.01 uIU/mL.  Magnesium     Status: Abnormal   Collection Time: 09/13/16 11:07 PM  Result Value Ref Range   Magnesium 1.6 (L) 1.7 - 2.4 mg/dL  Glucose, capillary     Status: None   Collection Time: 09/14/16 12:04 AM  Result Value Ref Range   Glucose-Capillary 65 65 - 99 mg/dL   Dg Chest Port 1 View  Result Date: 09/13/2016 CLINICAL DATA:  Sepsis. EXAM: PORTABLE CHEST 1 VIEW COMPARISON:  Most recent chest radiograph 08/28/2016 FINDINGS: Unchanged heart size and mediastinal contours. Improved bibasilar aeration with decreasing pleural effusions and atelectasis. No pulmonary edema. No pneumothorax, skin fold projects over the right chest. No confluent airspace disease or pneumothorax. Bones are under mineralized with chronic degenerative change of the shoulders. IMPRESSION: Improved bibasilar aeration with decreasing pleural effusions and bibasilar atelectasis. Electronically Signed   By: Jeb Levering M.D.   On: 09/13/2016 23:01    Review of Systems  Unable to perform ROS:  Critical illness    Blood pressure (!) 128/52, pulse 65, temperature (!) 96.4 F (35.8 C), temperature source Rectal, resp. rate 16, height _0  (1.6 m), weight 65.8 kg (145 lb), SpO2 97 %. Physical Exam  Vitals reviewed. Constitutional: She is oriented to person, place, and time. She appears well-developed and well-nourished. No distress.  HENT:  Head: Normocephalic and atraumatic.  Mouth/Throat: Oropharynx is clear and moist.  Eyes: Pupils are equal, round, and reactive to light. Conjunctivae and EOM are normal. No scleral icterus.  Neck: Normal range of motion. Neck supple. No JVD present. No tracheal deviation present. No thyromegaly present.  Cardiovascular: Normal rate, regular rhythm and normal heart sounds.  Exam reveals no gallop and no friction rub.   No murmur heard. Respiratory: Effort normal and breath sounds normal.  GI: Soft. Bowel sounds are normal. She exhibits no distension. There is no tenderness.  Genitourinary:  Genitourinary Comments: Deferred  Musculoskeletal: Normal range of motion. She exhibits no edema.  Lymphadenopathy:    She has no cervical adenopathy.  Neurological: She is alert and oriented to person, place, and time. No cranial nerve deficit. She exhibits normal muscle tone.  Skin: Skin is warm and dry. No rash noted. No erythema.  Psychiatric: She has a normal mood and affect. Her  behavior is normal. Judgment and thought content normal.     Assessment/Plan This is an 83 year old female admitted for hypothermia. 1. Hypothermia: Concomitant with hypoglycemia. Questionably iatrogenic. Continue warming blanket. Monitor for further signs or symptoms of sepsis. 2. Hypoglycemia: Likely precipitating hypothermia. Hold diabetic medications. Continue to supplement glucose via fluid resuscitation. 3. Hypokalemia: Replete potassium 4. Hypothyroidism: Start Synthroid. 5. Atrial fibrillation: The patient is on Eliquis (history of pulmonary embolism as well).  Continue amiodarone 6. DVT prophylaxis: Lovenox 7. GI prophylaxis: Pantoprazole per home regimen The patient is a full code. Time spent on admission orders and critical care approximately 45 minutes. Discussed with [facility] telemedicine.   Harrie Foreman, MD 09/14/2016, 2:38 AM

## 2016-09-14 NOTE — ED Notes (Addendum)
Pt vomited dark, brown, blood. MD Marcille Blanco informed. See orders. Pt denies GI complaints.

## 2016-09-14 NOTE — Progress Notes (Signed)
PT Cancellation Note  Patient Details Name: ROBERT SUNGA MRN: [id number] DOB: 05/28/1927   Cancelled Treatment:    Reason Eval/Treat Not Completed: Other (comment).  PT consult received.  Chart reviewed.  Pt discussed with nursing (who reported pt was lethargic today).  Plan to attempt PT eval tomorrow.  Leitha Bleak, PT 09/14/16, 4:13 PM [phone]

## 2016-09-14 NOTE — Care Management (Addendum)
Message left for Christy with [facility] health to see if they were involved with care. Message left for [place] with Adult Protective services of patient's readmission [phone]. Received callback from [facility]- They also made report to APS with two different appointments with [place] and the social worker did not show up. APS social worker name will be collected by nurse so that we can follow up. Hospital CSW updated. Patient went to her daughter's house last hospital discharge- was with her son prior to last admission. It is Christy's understand that daughter is with patient some however not all the time. 1624: I spoke with [place] Ar with APS about this patient's re-admission and that Alvis Lemmings has attempted to meet twice with APS SW that has no-showed- she asked that I call APS intake. Message left at [phone].

## 2016-09-14 NOTE — ED Notes (Signed)
[place], high temp, high flow in place.

## 2016-09-14 NOTE — Consult Note (Signed)
Name: Sherry [name] MRN: [id number] DOB: March 05, 1927    ADMISSION DATE:  09/13/2016  CONSULTATION DATE:  09/13/16  REFERRING MD :  Dr. Marcille Blanco  CHIEF COMPLAINT:  Hypoglycemia  BRIEF PATIENT DESCRIPTION: 83 year old female with altered mental status due to severe hypoglycemia  SIGNIFICANT EVENTS  9/6 Patient admitted to the SDU with severe hypoglycemia and now on D-10 infusion.  STUDIES:  None   HISTORY OF PRESENT ILLNESS:  Sherry [name] is an 83 year old female with known history of Chronic kidney disease, Dizziness, GERD,  Pulmonary Embolism,Hypertension and Hyperlipidemia.  Patient presented to ED with severe hypoglycemia with CBG OF 17.  Patient received an amp of dextrose and was started on D-10 infusion.  Apparently patient is well known to the facility due to the same reason and frequent admissions.  She has poor po intake at home.  Patient was admitted to the SDU for closer monitoring of her blood glucose .  PAST MEDICAL HISTORY :   has a past medical history of Anxiety; Arthritis; Chronic kidney disease; Diabetes mellitus without complication ([place]); Dizziness; Dyspnea; GERD (gastroesophageal reflux disease); Hyperlipidemia; Hypertension; and Pulmonary embolism ([place]) (2016).  has a past surgical history that includes Shoulder surgery (Left); Back surgery; Appendectomy; and Esophagogastroduodenoscopy (N/A, 05/29/2016). Prior to Admission medications   Medication Sig Start Date End Date Taking? Authorizing Provider  amiodarone (PACERONE) 200 MG tablet Take 1 tablet (200 mg total) by mouth daily. 09/08/16  Yes Vaughan Basta, MD  apixaban (ELIQUIS) 2.5 MG TABS tablet Take 1 tablet (2.5 mg total) by mouth 2 (two) times daily. 07/23/16  Yes Gladstone Lighter, MD  atorvastatin (LIPITOR) 20 MG tablet Take 20 mg by mouth at bedtime.    Yes [provider]  docusate sodium (COLACE) 100 MG capsule Take 1 capsule (100 mg total) by mouth 2 (two) times daily. 08/02/16  Yes Gladstone Lighter, MD  ferrous sulfate 325 (65 FE) MG tablet Take 1 tablet (325 mg total) by mouth daily with breakfast. 06/03/16  Yes Lorella Nimrod, MD  gabapentin (NEURONTIN) 300 MG capsule Take 300 mg by mouth at bedtime.    Yes [provider]  magnesium oxide (MAG-OX) 400 (241.3 Mg) MG tablet Take 1 tablet (400 mg total) by mouth daily. 04/27/16  Yes Wieting, Richard, MD  megestrol (MEGACE) 40 MG tablet Take 1 tablet (40 mg total) by mouth daily. 08/02/16  Yes Gladstone Lighter, MD  metoCLOPramide (REGLAN) 5 MG tablet Take 1 tablet (5 mg total) by mouth 2 (two) times daily. 06/02/16  Yes Lorella Nimrod, MD  Multiple Vitamin (MULTIVITAMIN WITH MINERALS) TABS tablet Take 1 tablet by mouth daily. 06/02/16  Yes Shela Leff, MD  ondansetron (ZOFRAN-ODT) 4 MG disintegrating tablet Take 4 mg by mouth every 6 (six) hours as needed for nausea or vomiting.   Yes [provider]  oxyCODONE-acetaminophen (PERCOCET) 7.5-325 MG tablet Take 1 tablet by mouth 2 (two) times daily as needed for severe pain. 08/08/16  Yes [provider]  pantoprazole (PROTONIX) 40 MG tablet Take 1 tablet (40 mg total) by mouth 2 (two) times daily. 04/27/16  Yes Wieting, Richard, MD  polyethylene glycol [facility]) packet Take 17 g by mouth daily. 08/02/16  Yes Gladstone Lighter, MD  sertraline (ZOLOFT) 25 MG tablet Take 1 tablet (25 mg total) by mouth daily. 06/02/16  Yes Lorella Nimrod, MD  sucralfate (CARAFATE) 1 GM/10ML suspension Take 10 mLs (1 g total) by mouth 4 (four) times daily -  with meals and  at bedtime. 06/02/16  Yes Lorella Nimrod, MD  tamsulosin (FLOMAX) 0.4 MG CAPS capsule Take 1 capsule (0.4 mg total) by mouth daily. Patient taking differently: Take 0.4 mg by mouth daily after lunch.  03/14/16  Yes Fritzi Mandes, MD   No Known Allergies  FAMILY HISTORY:  family history includes Cancer in her father and mother; Diabetes in her mother. SOCIAL HISTORY:  reports that she has never smoked. She has never  used smokeless tobacco. She reports that she does not drink alcohol or use drugs.  REVIEW OF SYSTEMS:   Constitutional: Negative for fever, chills, weight loss, malaise/fatigue and diaphoresis.  HENT: Negative for hearing loss, ear pain, nosebleeds, congestion, sore throat, neck pain, tinnitus and ear discharge.   Eyes: Negative for blurred vision, double vision, photophobia, pain, discharge and redness.  Respiratory: Negative for cough, hemoptysis, sputum production, shortness of breath, wheezing and stridor.   Cardiovascular: Negative for chest pain, palpitations, orthopnea, claudication, leg swelling and PND.  Gastrointestinal: Negative for heartburn, nausea, vomiting, abdominal pain, diarrhea, constipation, blood in stool and melena.  Genitourinary: Negative for dysuria, urgency, frequency, hematuria and flank pain.  Musculoskeletal: Negative for myalgias, back pain, joint pain and falls.  Skin: Negative for itching and rash.  Neurological: Negative for dizziness, tingling, tremors, sensory change, speech change, focal weakness, seizures, loss of consciousness, weakness and headaches.  Endo/Heme/Allergies: Negative for environmental allergies and polydipsia. Does not bruise/bleed easily.  SUBJECTIVE: Patient states that "she feels ok now"  VITAL SIGNS: Temp:  [90.1 F (32.3 C)-96.4 F (35.8 C)] 96.4 F (35.8 C) (09/06 0215) Pulse Rate:  [30-93] 65 (09/06 0215) Resp:  [14-19] 16 (09/06 0030) BP: (128-169)/(52-92) 128/52 (09/06 0215) SpO2:  [95 %-100 %] 97 % (09/06 0215) Weight:  [57.4 kg (126 lb 8.7 oz)-65.8 kg (145 lb)] 57.4 kg (126 lb 8.7 oz) (09/06 0215)  PHYSICAL EXAMINATION: General: Elderly caucasian female, in no acute distress Neuro:  Awake,Alert and oriented HEENT:  AT,[place],NO JVD Cardiovascular: S1S2,Regular, no m/r/g noted Lungs:  Clear bilaterally, no wheezes,crackles,rhonchi Abdomen: Soft,NT,ND Musculoskeletal:  No edema,cyanosis Skin:  Warm ,dry and  intact   Recent Labs Lab 09/13/16 2200  NA 138  K 2.5*  CL 105  CO2 23  BUN 12  CREATININE 0.96  GLUCOSE 143*    Recent Labs Lab 09/13/16 2307  HGB 12.2  HCT 37.1  WBC 5.7  PLT 268   Dg Chest Port 1 View  Result Date: 09/13/2016 CLINICAL DATA:  Sepsis. EXAM: PORTABLE CHEST 1 VIEW COMPARISON:  Most recent chest radiograph 08/28/2016 FINDINGS: Unchanged heart size and mediastinal contours. Improved bibasilar aeration with decreasing pleural effusions and atelectasis. No pulmonary edema. No pneumothorax, skin fold projects over the right chest. No confluent airspace disease or pneumothorax. Bones are under mineralized with chronic degenerative change of the shoulders. IMPRESSION: Improved bibasilar aeration with decreasing pleural effusions and bibasilar atelectasis. Electronically Signed   By: Jeb Levering M.D.   On: 09/13/2016 23:01    ASSESSMENT / PLAN:  Severe hypoglycemia AMS due to hypoglycemia- Improving Hypomagnesemia Hypokalemia Failure To Thrive due to poor po intake Hx of Pulmonary Embolism Hx of GERD Hx of HTN/HLD Hypothyroidism-TSH 8.9, T4-1.36   PLAN Follow hypoglycemia protocol Continue D-10 Infusion Continue Apixaban Continue Amiodarone/Atorvastatin Replace electrolytes  Continue Reglan/megace Continue Synthroid  Bincy Varughese,AG-ACNP Pulmonary and [place]   09/14/2016, 2:50 AM
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
## 2016-09-15 ENCOUNTER — Inpatient Hospital Stay: Payer: Medicare Other

## 2016-09-15 LAB — URINE CULTURE: Culture: NO GROWTH

## 2016-09-15 LAB — HEMOGLOBIN: Hemoglobin: 9.2 g/dL — ABNORMAL LOW (ref 12.0–16.0)

## 2016-09-15 LAB — GLUCOSE, CAPILLARY
Glucose-Capillary: 135 mg/dL — ABNORMAL HIGH (ref 65–99)
Glucose-Capillary: 205 mg/dL — ABNORMAL HIGH (ref 65–99)
Glucose-Capillary: 265 mg/dL — ABNORMAL HIGH (ref 65–99)
Glucose-Capillary: 273 mg/dL — ABNORMAL HIGH (ref 65–99)
Glucose-Capillary: 292 mg/dL — ABNORMAL HIGH (ref 65–99)

## 2016-09-15 MED ORDER — PANTOPRAZOLE SODIUM 40 MG IV SOLR
40.0000 mg | Freq: Two times a day (BID) | INTRAVENOUS | Status: DC
  Administered 2016-09-15 – 2016-09-19 (×9): 40 mg via INTRAVENOUS
  Filled 2016-09-15 (×10): qty 40

## 2016-09-15 MED ORDER — SODIUM CHLORIDE 0.9% FLUSH
10.0000 mL | Freq: Two times a day (BID) | INTRAVENOUS | Status: DC
  Administered 2016-09-15 – 2016-09-19 (×8): 10 mL

## 2016-09-15 MED ORDER — INSULIN ASPART 100 UNIT/ML ~~LOC~~ SOLN: 0.0000 [IU] | Freq: Every day | SUBCUTANEOUS | Status: DC

## 2016-09-15 MED ORDER — INSULIN ASPART 100 UNIT/ML ~~LOC~~ SOLN
0.0000 [IU] | Freq: Three times a day (TID) | SUBCUTANEOUS | Status: DC
  Administered 2016-09-15: 17:00:00 3 [IU] via SUBCUTANEOUS
  Administered 2016-09-16: 2 [IU] via SUBCUTANEOUS
  Administered 2016-09-16: 12:00:00 1 [IU] via SUBCUTANEOUS
  Administered 2016-09-16: 08:00:00 2 [IU] via SUBCUTANEOUS
  Administered 2016-09-17: 3 [IU] via SUBCUTANEOUS
  Administered 2016-09-17: 1 [IU] via SUBCUTANEOUS
  Administered 2016-09-18 – 2016-09-19 (×2): 2 [IU] via SUBCUTANEOUS
  Administered 2016-09-19: 1 [IU] via SUBCUTANEOUS
  Filled 2016-09-15 (×8): qty 1

## 2016-09-15 MED ORDER — ONDANSETRON 4 MG PO TBDP
4.0000 mg | ORAL_TABLET | Freq: Four times a day (QID) | ORAL | Status: DC | PRN
  Administered 2016-09-15: 4 mg via ORAL
  Filled 2016-09-15 (×3): qty 1

## 2016-09-15 MED ORDER — SODIUM CHLORIDE 0.9% FLUSH
10.0000 mL | INTRAVENOUS | Status: DC | PRN
  Administered 2016-09-19: 10 mL
  Filled 2016-09-15: qty 40

## 2016-09-15 NOTE — Clinical Social Work Placement (Addendum)
   CLINICAL SOCIAL WORK PLACEMENT  NOTE  Date:  09/15/2016  Patient Details  Name: Sherry [name] MRN: [id number] Date of Birth: 10-21-1927  Clinical Social Work is seeking post-discharge placement for this patient at the [place] level of care (*CSW will initial, date and re-position this form in  chart as items are completed):  Yes   Patient/family provided with [place] Work Department's list of facilities offering this level of care within the geographic area requested by the patient (or if unable, by the patient's family).  Yes   Patient/family informed of their freedom to choose among providers that offer the needed level of care, that participate in Medicare, Medicaid or managed care program needed by the patient, have an available bed and are willing to accept the patient.  Yes   Patient/family informed of [facility]'s ownership interest in [facility] and [facility], as well as of the fact that they are under no obligation to receive care at these facilities.  PASRR submitted to EDS on 09/15/16     PASRR number received on       Existing PASRR number confirmed on 09/15/16     FL2 transmitted to all facilities in geographic area requested by pt/family on 09/15/16     FL2 transmitted to all facilities within larger geographic area on       Patient informed that his/her managed care company has contracts with or will negotiate with certain facilities, including the following:            Patient/family informed of bed offers received.09-19-16 (Updated Evette Cristal, MSW, Latanya Presser 09-19-16)  Patient chooses bed at  [facility] (Updated Evette Cristal, MSW, [facility] 09-19-16)    Physician recommends and patient chooses bed at      Patient to be transferred to  [facility] on  09-19-16 (Updated Evette Cristal, MSW, LCSWA 09-19-16).  Patient to be transferred to facility by  [facility] (Updated Evette Cristal, MSW, LCSWA 09-19-16)     Patient family notified on  9-11-8 of transfer (Updated Evette Cristal, MSW, [place] 09-19-16).  Name of family member notified:   Left message on daughter's voice mail  Briscoe Burns Daughter [phone]  [phone]   (Updated Evette Cristal, MSW, LCSWA 09-19-16)  PHYSICIAN Please sign FL2     Additional Comment:    _______________________________________________ Ross Ludwig, LCSWA 09/15/2016, 5:47 PM  (Updated Evette Cristal, MSW, Latanya Presser 09-19-16)

## 2016-09-15 NOTE — Progress Notes (Signed)
Patient alert on room air. No complaints of pain. No signs of bleeding. Hemoglobin 9.8. Diet ordered. Consult for PT. IV team placing PICC. Palliative care consult entered. Report given to [facility].

## 2016-09-15 NOTE — Progress Notes (Signed)
Patient ID: Sherry [name], female   DOB: [birth year], 83 y.o.   MRN: [id number]  [place] PROGRESS NOTE  QUINTESSA SIMMERMAN EXB:[id number] DOB: 10/19/1927 DOA: 09/13/2016 PCP: [name], MD  HPI/Subjective: Patient feeling better today. Hoping to eat today. No shortness of breath or chest pain. No further vomiting.  Objective: Vitals:   09/15/16 0925 09/15/16 1000  BP: (!) 136/55 128/64  Pulse: (!) 105 (!) 106  Resp: 19 17  Temp: 99.7 F (37.6 C) 99.9 F (37.7 C)  SpO2: 97% 97%    Filed Weights   09/13/16 2155 09/14/16 0215 09/15/16 0434  Weight: 65.8 kg (145 lb) 57.4 kg (126 lb 8.7 oz) 59.7 kg (131 lb 9.8 oz)    ROS: Review of Systems  Constitutional: Negative for chills and fever.  Eyes: Negative for blurred vision.  Respiratory: Negative for cough and shortness of breath.   Cardiovascular: Negative for chest pain.  Gastrointestinal: Negative for abdominal pain, constipation, diarrhea, nausea and vomiting.  Genitourinary: Negative for dysuria.  Musculoskeletal: Negative for joint pain.  Neurological: Negative for dizziness and headaches.   Exam: Physical Exam  HENT:  Nose: No mucosal edema.  Mouth/Throat: No oropharyngeal exudate or posterior oropharyngeal edema.  Eyes: Pupils are equal, round, and reactive to light. Conjunctivae and lids are normal.  Neck: No JVD present. Carotid bruit is not present. No edema present. No thyroid mass and no thyromegaly present.  Cardiovascular: S1 normal, S2 normal and normal heart sounds.  Tachycardia present.  Exam reveals no gallop.   No murmur heard. Pulses:      Dorsalis pedis pulses are 1+ on the right side, and 1+ on the left side.  Respiratory: No respiratory distress. She has decreased breath sounds in the right lower field and the left lower field. She has no wheezes. She has no rhonchi. She has no rales.  GI: Soft. Bowel sounds are normal. There is tenderness in the epigastric area.  Musculoskeletal:       Right  ankle: She exhibits swelling.       Left ankle: She exhibits swelling.  Lymphadenopathy:    She has no cervical adenopathy.  Neurological: She is alert. No cranial nerve deficit.  Skin: Skin is warm. No rash noted. Nails show no clubbing.  Psychiatric: She has a normal mood and affect.      Data Reviewed: Basic Metabolic Panel:  Recent Labs Lab 09/13/16 2200 09/13/16 2307 09/14/16 1032  NA 138  --   --   K 2.5*  --  4.0  CL 105  --   --   CO2 23  --   --   GLUCOSE 143*  --   --   BUN 12  --   --   CREATININE 0.96  --   --   CALCIUM 8.4*  --   --   MG  --  1.6*  --    Liver Function Tests:  Recent Labs Lab 09/13/16 2200  AST 28  ALT 13*  ALKPHOS 86  BILITOT 0.6  PROT 6.9  ALBUMIN 3.2*   CBC:  Recent Labs Lab 09/13/16 2307 09/14/16 1446  WBC 5.7 8.5  NEUTROABS  --  7.2*  HGB 12.2 9.8*  HCT 37.1 28.2*  MCV 83.9 84.1  PLT 268 230   Cardiac Enzymes:  Recent Labs Lab 09/13/16 2307 09/14/16 1032 09/14/16 1446 09/14/16 2033  TROPONINI <0.03 0.41* <0.03 <0.03   BNP (last 3 results)  Recent Labs  03/07/16 1559 08/21/16  0241 09/05/16 2002  BNP 87.0 94.0 248.0*     CBG:  Recent Labs Lab 09/14/16 1806 09/14/16 2019 09/15/16 0017 09/15/16 0406 09/15/16 0733  GLUCAP 250* 259* 273* 292* 265*    Recent Results (from the past 240 hour(s))  Urine culture     Status: None   Collection Time: 09/05/16  8:02 PM  Result Value Ref Range Status   Specimen Description URINE, CATHETERIZED  Final   Special Requests NONE  Final   Culture   Final    NO GROWTH Performed at [facility], [place] [address] [place], [place] [address]    Report Status 09/07/2016 FINAL  Final  Urine culture     Status: None   Collection Time: 09/13/16 11:07 PM  Result Value Ref Range Status   Specimen Description URINE, RANDOM  Final   Special Requests NONE  Final   Culture   Final    NO GROWTH Performed at [facility], [place] [address] [place],  [place] [address]    Report Status 09/15/2016 FINAL  Final  MRSA PCR Screening     Status: None   Collection Time: 09/14/16  2:12 AM  Result Value Ref Range Status   MRSA by PCR NEGATIVE NEGATIVE Final    Comment:        The GeneXpert MRSA Assay (FDA approved for NASAL specimens only), is one component of a comprehensive MRSA colonization surveillance program. It is not intended to diagnose MRSA infection nor to guide or monitor treatment for MRSA infections.      Studies: Dg Chest Port 1 View  Result Date: 09/15/2016 CLINICAL DATA:  PICC line placement. Hx of diabetes, HTN. EXAM: PORTABLE CHEST 1 VIEW COMPARISON:  Chest x-ray dated 09/13/2016. FINDINGS: Right-sided PICC line in place. Tip is positioned at the level of the upper SVC, approximately 5-7 cm above the expected level of the cavoatrial junction. Heart size and mediastinal contours are stable. Atherosclerotic changes noted at the aortic arch. Lungs are clear. No pleural effusion or pneumothorax seen. IMPRESSION: 1. Right-sided PICC line in place, tip positioned at the level of the upper SVC and approximately 5-7 cm above the expected level of the cavoatrial junction. Consider advancing for more optimal radiographic positioning. 2. Aortic atherosclerosis. Electronically Signed   By: Franki Cabot M.D.   On: 09/15/2016 12:53   Dg Chest Port 1 View  Result Date: 09/13/2016 CLINICAL DATA:  Sepsis. EXAM: PORTABLE CHEST 1 VIEW COMPARISON:  Most recent chest radiograph 08/28/2016 FINDINGS: Unchanged heart size and mediastinal contours. Improved bibasilar aeration with decreasing pleural effusions and atelectasis. No pulmonary edema. No pneumothorax, skin fold projects over the right chest. No confluent airspace disease or pneumothorax. Bones are under mineralized with chronic degenerative change of the shoulders. IMPRESSION: Improved bibasilar aeration with decreasing pleural effusions and bibasilar atelectasis. Electronically Signed   By: Jeb Levering M.D.   On: 09/13/2016 23:01    Scheduled Meds: . amiodarone  200 mg Oral Daily  . atorvastatin  20 mg Oral QHS  . docusate sodium  100 mg Oral BID  . ferrous sulfate  325 mg Oral Q breakfast  . gabapentin  300 mg Oral QHS  . insulin aspart  0-5 Units Subcutaneous QHS  . insulin aspart  0-9 Units Subcutaneous TID WC  . levothyroxine  50 mcg Oral QAC breakfast  . magnesium oxide  400 mg Oral Daily  . megestrol  40 mg Oral Daily  . metoCLOPramide  5 mg  Oral BID  . multivitamin with minerals  1 tablet Oral Daily  . pantoprazole (PROTONIX) IV  40 mg Intravenous Q12H  . polyethylene glycol  17 g Oral Daily  . sertraline  25 mg Oral Daily  . sodium chloride flush  10-40 mL Intracatheter Q12H  . sucralfate  1 g Oral TID WC & HS  . tamsulosin  0.4 mg Oral Daily   Continuous Infusions:   Assessment/Plan:  1. Severe hypoglycemia. This has resolved. Start sliding scale insulin for now and see how things go. 2. Upper GI bleed with hematemesis. Patient had esophagitis on last endoscopy. Continue Protonix IV. Check a hemoglobin today and tomorrow morning. Eliquis currently on hold but will likely have to go back on this medication secondary to repeated pulmonary embolism. 3. Hypothermia.  Pro calcitonin negative so less likely infection.  Temperature curve back to normal 4. History of a arrhythmia and pulmonary embolism.  On amiodarone. If hemoglobin remains stable and no further hematemesis may be able to restart Eliquis tomorrow. 5. Diabetes with neuropathy on gabapentin 6. Hypothyroidism unspecified on levothyroxine 7. Hyperlipidemia unspecified on Lipitor 8. Chronic kidney disease stage III 9. Severe hypokalemia now replaced 10. Hypomagnesemia replaced 11. Lactic acidosis improved with IV fluids  Social workers stated APS referral has been made. The patient will need placement and will not be discharged over the weekend. Guardianship will be looked into.  Code Status:      Code Status Orders        Start     Ordered   09/14/16 0218  Full code  Continuous     09/14/16 0217    Code Status History    Date Active Date Inactive Code Status Order ID Comments User Context   09/06/2016 12:38 AM 09/07/2016  8:31 PM Full Code [id number]  Lance Coon, MD Inpatient   08/29/2016  4:44 PM 08/31/2016  8:01 PM Full Code [id number]  Hillary Bow, MD ED   08/21/2016  6:15 AM 08/22/2016  7:28 PM Full Code [id number]  Lance Coon, MD Inpatient   07/28/2016  9:06 PM 08/02/2016  8:04 PM Full Code [id number]  Lance Coon, MD Inpatient   07/22/2016  4:13 AM 07/24/2016  5:43 PM Full Code [id number]  Harrie Foreman, MD Inpatient   07/12/2016 10:45 PM 07/18/2016  8:32 PM Full Code [id number]  Hugelmeyer, Alexis, DO Inpatient   05/28/2016 12:01 PM 06/02/2016  7:32 PM Full Code [id number]  Lorella Nimrod, MD Inpatient   04/24/2016  7:27 PM 04/27/2016  8:08 PM Full Code [id number]  Loletha Grayer, MD ED   04/19/2016 12:50 AM 04/22/2016 12:10 AM Full Code [id number]  Lance Coon, MD Inpatient   03/27/2016  9:39 PM 03/29/2016  9:41 PM Full Code [id number]  Fritzi Mandes, MD ED   03/12/2016 12:21 PM 03/13/2016  8:32 PM Full Code [id number]  Dustin Flock, MD Inpatient   03/09/2016  4:33 PM 03/12/2016 12:21 PM DNR [id number]  Nicholes Mango, MD ED   02/26/2016  9:39 PM 03/01/2016 10:37 PM DNR [id number]  Karmen Bongo, MD Inpatient   02/26/2016  7:48 PM 02/26/2016  9:39 PM DNR [id number]  Dorie Rank, MD ED   01/31/2016  5:45 PM 02/07/2016 11:45 PM DNR [id number]  Flora Lipps, MD Inpatient   01/30/2016 11:19 PM 01/31/2016  5:44 PM Full Code [id number]  Giddings, Ubaldo Glassing, DO Inpatient   11/26/2014  3:52 PM 11/28/2014  3:07 PM Full Code [id number]  Max Sane, MD Inpatient   11/26/2014  8:59 AM 11/26/2014  3:52 PM Full Code [id number]  Max Sane, MD ED     Family Communication: as per critical care team today Disposition Plan: transferred to floor  Consultants:  Critical care  specialist  Gastroenterology  Palliative care  Time spent: Full code  [place], [facility]

## 2016-09-15 NOTE — Progress Notes (Signed)
Peripherally Inserted Central Catheter/Midline Placement  The IV Nurse has discussed with the patient and/or persons authorized to consent for the patient, the purpose of this procedure and the potential benefits and risks involved with this procedure.  The benefits include less needle sticks, lab draws from the catheter, and the patient may be discharged home with the catheter. Risks include, but not limited to, infection, bleeding, blood clot (thrombus formation), and puncture of an artery; nerve damage and irregular heartbeat and possibility to perform a PICC exchange if needed/ordered by physician.  Alternatives to this procedure were also discussed.  Bard Power PICC patient education guide, fact sheet on infection prevention and patient information card has been provided to patient /or left at bedside.    PICC/Midline Placement Documentation        Sherry [name] 09/15/2016, 12:32 PM
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
## 2016-09-15 NOTE — Progress Notes (Signed)
Sherry Lame, MD [facility]   [address], [place], [place] [address] Phone: [phone] Fax : [phone]   Subjective: The patient has had no further vomiting and her hemoglobin is stable.  There is no complaints today.   Objective: Vital signs in last 24 hours: Vitals:   09/15/16 0700 09/15/16 0840 09/15/16 0925 09/15/16 1000  BP: 138/79 (!) 162/71 (!) 136/55 128/64  Pulse: 96 95 (!) 105 (!) 106  Resp: (!) 24 17 19 17   Temp: 100 F (37.8 C) 99.9 F (37.7 C) 99.7 F (37.6 C) 99.9 F (37.7 C)  TempSrc:      SpO2: 97% 95% 97% 97%  Weight:      Height:       Weight change: -13 lb 6.2 oz (-6.072 kg)  Intake/Output Summary (Last 24 hours) at 09/15/16 1631 Last data filed at 09/15/16 1200  Gross per 24 hour  Intake             2500 ml  Output             1650 ml  Net              850 ml     Exam: Heart:: Regular rate and rhythm, S1S2 present or without murmur or extra heart sounds Lungs: normal and clear to auscultation and percussion Abdomen: soft, nontender, normal bowel sounds   Lab Results: @LABTEST2 @ Micro Results: Recent Results (from the past 240 hour(s))  Urine culture     Status: None   Collection Time: 09/05/16  8:02 PM  Result Value Ref Range Status   Specimen Description URINE, CATHETERIZED  Final   Special Requests NONE  Final   Culture   Final    NO GROWTH Performed at [facility], [address] [place], [place] [address]    Report Status 09/07/2016 FINAL  Final  Urine culture     Status: None   Collection Time: 09/13/16 11:07 PM  Result Value Ref Range Status   Specimen Description URINE, RANDOM  Final   Special Requests NONE  Final   Culture   Final    NO GROWTH Performed at [facility], [place] [address], [place], [place] [address]    Report Status 09/15/2016 FINAL  Final  MRSA PCR Screening     Status: None   Collection Time: 09/14/16  2:12 AM  Result Value Ref Range Status   MRSA by PCR NEGATIVE NEGATIVE Final      Comment:        The GeneXpert MRSA Assay (FDA approved for NASAL specimens only), is one component of a comprehensive MRSA colonization surveillance program. It is not intended to diagnose MRSA infection nor to guide or monitor treatment for MRSA infections.    Studies/Results: Dg Chest Port 1 View  Result Date: 09/15/2016 CLINICAL DATA:  PICC line placement. Hx of diabetes, HTN. EXAM: PORTABLE CHEST 1 VIEW COMPARISON:  Chest x-ray dated 09/13/2016. FINDINGS: Right-sided PICC line in place. Tip is positioned at the level of the upper SVC, approximately 5-7 cm above the expected level of the cavoatrial junction. Heart size and mediastinal contours are stable. Atherosclerotic changes noted at the aortic arch. Lungs are clear. No pleural effusion or pneumothorax seen. IMPRESSION: 1. Right-sided PICC line in place, tip positioned at the level of the upper SVC and approximately 5-7 cm above the expected level of the cavoatrial junction. Consider advancing for more optimal radiographic positioning. 2. Aortic atherosclerosis. Electronically Signed  By: Franki Cabot M.D.   On: 09/15/2016 12:53   Dg Chest Port 1 View  Result Date: 09/13/2016 CLINICAL DATA:  Sepsis. EXAM: PORTABLE CHEST 1 VIEW COMPARISON:  Most recent chest radiograph 08/28/2016 FINDINGS: Unchanged heart size and mediastinal contours. Improved bibasilar aeration with decreasing pleural effusions and atelectasis. No pulmonary edema. No pneumothorax, skin fold projects over the right chest. No confluent airspace disease or pneumothorax. Bones are under mineralized with chronic degenerative change of the shoulders. IMPRESSION: Improved bibasilar aeration with decreasing pleural effusions and bibasilar atelectasis. Electronically Signed   By: Jeb Levering M.D.   On: 09/13/2016 23:01   Medications: I have reviewed the patient's current medications. Scheduled Meds: . amiodarone  200 mg Oral Daily  . atorvastatin  20 mg Oral QHS  .  docusate sodium  100 mg Oral BID  . ferrous sulfate  325 mg Oral Q breakfast  . gabapentin  300 mg Oral QHS  . insulin aspart  0-5 Units Subcutaneous QHS  . insulin aspart  0-9 Units Subcutaneous TID WC  . levothyroxine  50 mcg Oral QAC breakfast  . magnesium oxide  400 mg Oral Daily  . megestrol  40 mg Oral Daily  . metoCLOPramide  5 mg Oral BID  . multivitamin with minerals  1 tablet Oral Daily  . pantoprazole (PROTONIX) IV  40 mg Intravenous Q12H  . polyethylene glycol  17 g Oral Daily  . sertraline  25 mg Oral Daily  . sodium chloride flush  10-40 mL Intracatheter Q12H  . sucralfate  1 g Oral TID WC & HS  . tamsulosin  0.4 mg Oral Daily   Continuous Infusions: PRN Meds:.acetaminophen **OR** acetaminophen, hydrALAZINE, [DISCONTINUED] ondansetron **OR** ondansetron (ZOFRAN) IV, ondansetron, oxyCODONE-acetaminophen, sodium chloride flush   Assessment: Active Problems:   Hypothermia   Hematemesis without nausea    Plan: The patient has had no further signs of vomiting or abdominal pain.  There is also a stable hemoglobin today.  Nothing further to do on this patient from a GI point of view.  The patient had a upper endoscopy in the past that showed esophagitis.  The patient should continue to be treated for recurrent esophagitis. I will sign off.  Please call if any further GI concerns or questions.  We would like to thank you for the opportunity to participate in the care of Sherry [name].    LOS: 2 days   Sherry [name] 09/15/2016, 4:31 PM

## 2016-09-15 NOTE — NC FL2 (Signed)
[place] LEVEL OF CARE SCREENING TOOL     IDENTIFICATION  Patient Name: Sherry [name] Birthdate: [birth year] Sex: female Admission Date (Current Location): 09/13/2016  [place] and [place] Number:  Engineering geologist and Address:  [facility], [address], [place], [place] [address]      Provider Number: [id number]  Attending Physician Name and Address:  Loletha Grayer, MD  Relative Name and Phone Number:       Current Level of Care: Hospital Recommended Level of Care: [place] Prior Approval Number:    Date Approved/Denied:   PASRR Number: [id number]  Discharge Plan: SNF    Current Diagnoses: Patient Active Problem List   Diagnosis Date Noted  . Hematemesis without nausea   . Hypothermia 09/13/2016  . Acute encephalopathy 09/05/2016  . Hypoglycemia 09/05/2016  . Chronic systolic CHF (congestive heart failure) ([place]) 09/05/2016  . Syncope 08/29/2016  . Chronic idiopathic constipation   . HLD (hyperlipidemia) 07/28/2016  . Fecal impaction of colon ([place]) 07/28/2016  . Fecal impaction ([place]) 07/28/2016  . Atrial fibrillation with RVR ([place]) 07/22/2016  . Dehydration   . Renal mass   . Chest pain, rule out acute myocardial infarction 07/12/2016  . Reactive depression   . Esophagitis determined by endoscopy 05/31/2016  . Hyperglycemia   . Hypokalemia   . Cystitis   . HH (hiatus hernia)   . Hiatal hernia with gastroesophageal reflux disease without esophagitis   . GI bleed 05/28/2016  . Upper GI bleeding 05/28/2016  . Pressure injury of skin 05/28/2016  . Atrial flutter ([place])   . Gastroenteritis 04/24/2016  . Aphasia 04/18/2016  . Nausea & vomiting 03/27/2016  . Advance care planning   . UTI (urinary tract infection) 03/12/2016  . Chest pain 03/09/2016  . PE (pulmonary thromboembolism) ([place]) 02/26/2016  . Diabetes mellitus type 2, insulin dependent ([place]) 02/26/2016  . Anemia 02/26/2016  . Chronic  kidney disease (CKD), stage III (moderate) 02/26/2016  . Essential hypertension 02/26/2016  . Goals of care, counseling/discussion   . Adult failure to thrive syndrome   . Palliative care by specialist   . Sepsis ([place]) 11/26/2014    Orientation RESPIRATION BLADDER Height & Weight     Self, Time, Situation, Place  Normal Incontinent Weight: 131 lb 9.8 oz (59.7 kg) Height:  5\' 4"  (162.6 cm)  BEHAVIORAL SYMPTOMS/MOOD NEUROLOGICAL BOWEL NUTRITION STATUS   (none)  (none) Incontinent Diet (soft)  AMBULATORY STATUS COMMUNICATION OF NEEDS Skin   Extensive Assist Verbally Normal, PU Stage and Appropriate Care                       Personal Care Assistance Level of Assistance  Bathing, Dressing Bathing Assistance: Maximum assistance Feeding assistance: Maximum assistance Dressing Assistance: Maximum assistance     Functional Limitations Info    Sight Info: Adequate Hearing Info: Adequate Speech Info: Adequate    SPECIAL CARE FACTORS FREQUENCY  PT (By licensed PT)                    Contractures Contractures Info: Not present    Additional Factors Info  Code Status, Allergies Code Status Info: full Allergies Info: nka           Current Medications (09/15/2016):  This is the current hospital active medication list Current Facility-Administered Medications  Medication Dose Route Frequency Provider Last Rate Last Dose  . acetaminophen (TYLENOL) tablet 650 mg  650 mg Oral Q6H  PRN Harrie Foreman, MD       Or  . acetaminophen (TYLENOL) suppository 650 mg  650 mg Rectal Q6H PRN Harrie Foreman, MD      . amiodarone (PACERONE) tablet 200 mg  200 mg Oral Daily Harrie Foreman, MD   200 mg at 09/15/16 0900  . atorvastatin (LIPITOR) tablet 20 mg  20 mg Oral QHS Harrie Foreman, MD      . docusate sodium (COLACE) capsule 100 mg  100 mg Oral BID Harrie Foreman, MD   100 mg at 09/14/16 0920  . ferrous sulfate tablet 325 mg  325 mg Oral Q breakfast Harrie Foreman, MD   325 mg at 09/14/16 0900  . gabapentin (NEURONTIN) capsule 300 mg  300 mg Oral QHS Harrie Foreman, MD      . hydrALAZINE (APRESOLINE) injection 10-20 mg  10-20 mg Intravenous Q4H PRN Awilda Bill, NP   10 mg at 09/14/16 1517  . insulin aspart (novoLOG) injection 0-5 Units  0-5 Units Subcutaneous QHS Wieting, Richard, MD      . insulin aspart (novoLOG) injection 0-9 Units  0-9 Units Subcutaneous TID WC Wieting, Richard, MD      . levothyroxine (SYNTHROID, LEVOTHROID) tablet 50 mcg  50 mcg Oral QAC breakfast Harrie Foreman, MD   50 mcg at 09/15/16 0900  . magnesium oxide (MAG-OX) tablet 400 mg  400 mg Oral Daily Harrie Foreman, MD   400 mg at 09/15/16 0900  . megestrol (MEGACE) tablet 40 mg  40 mg Oral Daily Harrie Foreman, MD   40 mg at 09/15/16 0900  . metoCLOPramide (REGLAN) tablet 5 mg  5 mg Oral BID Harrie Foreman, MD   5 mg at 09/15/16 0900  . multivitamin with minerals tablet 1 tablet  1 tablet Oral Daily Harrie Foreman, MD   1 tablet at 09/14/16 0920  . ondansetron (ZOFRAN) injection 4 mg  4 mg Intravenous Q6H PRN Harrie Foreman, MD   4 mg at 09/14/16 1953  . ondansetron (ZOFRAN-ODT) disintegrating tablet 4 mg  4 mg Oral Q6H PRN Loletha Grayer, MD   4 mg at 09/15/16 1056  . oxyCODONE-acetaminophen (PERCOCET) 7.5-325 MG per tablet 1 tablet  1 tablet Oral BID PRN Harrie Foreman, MD      . pantoprazole (PROTONIX) injection 40 mg  40 mg Intravenous Q12H Flora Lipps, MD   40 mg at 09/15/16 1428  . polyethylene glycol (MIRALAX / GLYCOLAX) packet 17 g  17 g Oral Daily Harrie Foreman, MD   17 g at 09/14/16 0919  . sertraline (ZOLOFT) tablet 25 mg  25 mg Oral Daily Harrie Foreman, MD   25 mg at 09/14/16 0920  . sodium chloride flush (NS) 0.9 % injection 10-40 mL  10-40 mL Intracatheter Q12H Loletha Grayer, MD   10 mL at 09/15/16 1428  . sodium chloride flush (NS) 0.9 % injection 10-40 mL  10-40 mL Intracatheter PRN Wieting, Richard, MD       . sucralfate (CARAFATE) 1 GM/10ML suspension 1 g  1 g Oral TID WC & HS Harrie Foreman, MD   1 g at 09/15/16 1429  . tamsulosin (FLOMAX) capsule 0.4 mg  0.4 mg Oral Daily Harrie Foreman, MD   0.4 mg at 09/15/16 0900     Discharge Medications: Please see discharge summary for a list of discharge medications.  Relevant Imaging Results:  Relevant Lab Results:   Additional  Information ss: [id number]  Shela Leff, LCSW

## 2016-09-15 NOTE — Evaluation (Signed)
Physical Therapy Evaluation Patient Details Name: Sherry [name] MRN: [id number] DOB: January 25, 1927 Today's Date: 09/15/2016   History of Present Illness  Pt is an 83 y.o. female presenting to hospital with AMS, vomiting dark brown blood, hypothermic, and hypoglycemic (CBG 17).  Pt admitted to hospital with hypothermia, severe hypoglycemia (initially placed on D-10 infusion), hypokalemia, and FTT d/t poor PO intake.  Pt converted into atrial fib 09/15/16.  Of note, pt with 11 admits in past 6 months.  PMH inlcudes anxiety, CKD, DM, dizziness, htn, PE, L shoulder surgery, back surgery.  Clinical Impression  Prior to hospital admission, pt was ambulating with rollator household distances or using manual wheelchair to get around in home.  Pt lives with her son in 1 level home (no stairs to enter).  Pt reports being home alone during the day.  Currently pt is SBA supine to sit and CGA to min assist with transfers and ambulating 30 feet with RW.  Pt demonstrating mild SOB after transferring to chair and moderate SOB after short ambulation in room (pt reports she normally does not get this SOB with this level of activity).  O2 sats 98% or greater on room air during session.  HR 96-113 bpm during session.  Pt would benefit from skilled PT to address noted impairments and functional limitations (see below for any additional details).  Upon hospital discharge, recommend pt discharge to STR to improve strength, balance, activity tolerance, and functional independence.    Follow Up Recommendations SNF    Equipment Recommendations  Rolling walker with 5" wheels    Recommendations for Other Services       Precautions / Restrictions Precautions Precautions: Fall Restrictions Weight Bearing Restrictions: No      Mobility  Bed Mobility Overal bed mobility: Needs Assistance Bed Mobility: Supine to Sit     Supine to sit: Supervision;HOB elevated     General bed mobility comments: increased effort to perform  by pt; SOB noted  Transfers Overall transfer level: Needs assistance Equipment used: Rolling walker (2 wheeled) Transfers: Sit to/from [facility] Sit to Stand: Min assist Stand pivot transfers: Min guard;Min assist       General transfer comment: assist to initiate stand; vc's for safety with walker use required  Ambulation/Gait Ambulation/Gait assistance: Min guard;Min assist Ambulation Distance (Feet): 30 Feet Assistive device: Rolling walker (2 wheeled)   Gait velocity: decreased   General Gait Details: decreased B step length; increased B lateral sway noted with distance requiring min assist to steady; increasing SOB noted with distance  Stairs            Wheelchair Mobility    Modified Rankin (Stroke Patients Only)       Balance Overall balance assessment: Needs assistance   Sitting balance-Leahy Scale: Good Sitting balance - Comments: sitting reaching within BOS   Standing balance support: Bilateral upper extremity supported Standing balance-Leahy Scale: Poor Standing balance comment: requires UE support for static standing balance                             Pertinent Vitals/Pain Pain Assessment: No/denies pain    Home Living Family/patient expects to be discharged to:: Private residence Living Arrangements: Children (Pt's son) Available Help at Discharge: Family;Available PRN/intermittently (Pt's son works during the day) Type of Home: House Home Access: Level entry     [place]: One [facility] bed;Shower seat;Wheelchair - manual;Bedside commode;Walker - 4 wheels;Grab bars -  tub/shower      Prior Function Level of Independence: Needs assistance   Gait / Transfers Assistance Needed: Ambulates with 4ww to bathroom but also uses manual w/c in home as well (pushes self in w/c unless son is around).  ADL's / Homemaking Assistance Needed: Daughter assists with medications and food per pt report;  son assists with meals and cleaning/housework per pt report; pt reports performing own sponge baths.        Hand Dominance        Extremity/Trunk Assessment   Upper Extremity Assessment Upper Extremity Assessment: Generalized weakness    Lower Extremity Assessment Lower Extremity Assessment: Generalized weakness       Communication   Communication: HOH  Cognition Arousal/Alertness: Awake/alert Behavior During Therapy: WFL for tasks assessed/performed (Oriented to person, place, situation, month, year, and close to day) Overall Cognitive Status: Within Functional Limits for tasks assessed                                        General Comments General comments (skin integrity, edema, etc.): Pt resting in bed upon PT entry.  Nursing cleared pt for participation in physical therapy.  Pt agreeable to PT session.    Exercises     Assessment/Plan    PT Assessment Patient needs continued PT services  PT Problem List Decreased strength;Decreased activity tolerance;Decreased balance;Decreased mobility       PT Treatment Interventions DME instruction;Gait training;Functional mobility training;Therapeutic activities;Therapeutic exercise;Balance training;Patient/family education    PT Goals (Current goals can be found in the Care Plan section)  Acute Rehab PT Goals Patient Stated Goal: Return to prior level of function PT Goal Formulation: With patient Time For Goal Achievement: 09/29/16 Potential to Achieve Goals: Good    Frequency Min 2X/week   Barriers to discharge Decreased caregiver support      Co-evaluation               AM-PAC PT "6 Clicks" Daily Activity  Outcome Measure Difficulty turning over in bed (including adjusting bedclothes, sheets and blankets)?: A Little Difficulty moving from lying on back to sitting on the side of the bed? : A Lot Difficulty sitting down on and standing up from a chair with arms (e.g., wheelchair, bedside  commode, etc,.)?: Unable Help needed moving to and from a bed to chair (including a wheelchair)?: A Little Help needed walking in hospital room?: A Little Help needed climbing 3-5 steps with a railing? : A Lot 6 Click Score: 14    End of Session Equipment Utilized During Treatment: Gait belt Activity Tolerance: Patient limited by fatigue Patient left: in chair;with call bell/phone within reach;with chair alarm set Nurse Communication: Mobility status;Precautions PT Visit Diagnosis: Unsteadiness on feet (R26.81);Muscle weakness (generalized) (M62.81);Difficulty in walking, not elsewhere classified (R26.2)    Time: 6578-4696 PT Time Calculation (min) (ACUTE ONLY): 25 min   Charges:   PT Evaluation $PT Eval Low Complexity: 1 Low PT Treatments $Therapeutic Activity: 8-22 mins   PT G Codes:   PT G-Codes **NOT FOR INPATIENT CLASS** Functional Assessment Tool Used: AM-PAC 6 Clicks Basic Mobility Functional Limitation: Mobility: Walking and moving around Mobility: Walking and Moving Around Current Status (E9528): At least 40 percent but less than 60 percent impaired, limited or restricted Mobility: Walking and Moving Around Goal Status [phone]): 0 percent impaired, limited or restricted    Leitha Bleak, PT 09/15/16, 3:28 PM [phone]

## 2016-09-15 NOTE — Consult Note (Signed)
[place] Nurse wound consult note Reason for Consult: Stage 1 to sacrum.  Patient has nonblanchable erythema to sacrum.  Please turn and reposition every two hours. Offload pressure and this should resolve.  Wound type:stage 1 pressure injury Pressure Injury POA: Yes Measurement: 1 cm x 1 cm blanchable erythema Wound [id number] Drainage (amount, consistency, odor) none Periwound:intact Dressing procedure/placement/frequency:Turn and reposition every two hours.  Barrier cream with perineal care each shift.  Will not follow at this time.  Please re-consult if needed.  Domenic Moras RN BSN CWON Pager [phone] :

## 2016-09-15 NOTE — Progress Notes (Signed)
Pt has converted into Atrial fib on telemetry, previously had been NSR with PVC's. Heart rate is controlled, consistently running in the 80's.  Pt is asymptomatic.  Pt had previously been on Eliquis for a history of PE, which Eliquis was stopped on 9/6 due to GI bleed and hematemesis.  Spoke with Lindwood Qua NP of conversion into A-fib.  No new orders at this time per [place], continue to monitor.

## 2016-09-15 NOTE — Progress Notes (Signed)
Pt remains alert and oriented, no complaints of pain.  Now atrial fib on telemetry, room air, lungs clear upon auscultation.  Remains NPO.  Incontinent of urine for part of shift, PureWick external catheter now successfully draining urine.  Vital signs stable, low grade temperature 99-100 F.  Remains on protonix, D10, and NS infusions.  Pt currently with one PIV with limited peripheral venous access (several unsuccessful IV start attempts by both myself and nursing supervisor).  Spoke with Lindwood Qua NP regarding only 1 PIV and whether to obtain consult for PICC.  Per [place], will discuss with Dr. Mortimer Fries today as to whether to obtain PICC.

## 2016-09-16 LAB — GLUCOSE, CAPILLARY
Glucose-Capillary: 151 mg/dL — ABNORMAL HIGH (ref 65–99)
Glucose-Capillary: 135 mg/dL — ABNORMAL HIGH (ref 65–99)
Glucose-Capillary: 187 mg/dL — ABNORMAL HIGH (ref 65–99)
Glucose-Capillary: 122 mg/dL — ABNORMAL HIGH (ref 65–99)

## 2016-09-16 LAB — BASIC METABOLIC PANEL
Anion gap: 5 (ref 5–15)
BUN: 8 mg/dL (ref 6–20)
CHLORIDE: 106 mmol/L (ref 101–111)
CO2: 25 mmol/L (ref 22–32)
CREATININE: 0.93 mg/dL (ref 0.44–1.00)
Calcium: 7.7 mg/dL — ABNORMAL LOW (ref 8.9–10.3)
GFR calc Af Amer: >60 mL/min (ref >60)
GFR calc non Af Amer: 54 mL/min — ABNORMAL LOW (ref >60)
Glucose, Bld: 119 mg/dL — ABNORMAL HIGH (ref 65–99)
POTASSIUM: 3.5 mmol/L (ref 3.5–5.1)
Sodium: 136 mmol/L (ref 135–145)

## 2016-09-16 LAB — CBC
HEMATOCRIT: 27.7 % — AB (ref 35.0–47.0)
Hemoglobin: 9.3 g/dL — ABNORMAL LOW (ref 12.0–16.0)
MCH: 28.5 pg (ref 26.0–34.0)
MCHC: 33.4 g/dL (ref 32.0–36.0)
MCV: 85.1 fL (ref 80.0–100.0)
PLATELETS: 273 10*3/uL (ref 150–440)
RBC: 3.26 MIL/uL — ABNORMAL LOW (ref 3.80–5.20)
RDW: 16 % — AB (ref 11.5–14.5)
WBC: 5.4 10*3/uL (ref 3.6–11.0)

## 2016-09-16 MED ORDER — APIXABAN 2.5 MG PO TABS
2.5000 mg | ORAL_TABLET | Freq: Two times a day (BID) | ORAL | Status: DC
  Administered 2016-09-16 – 2016-09-19 (×7): 2.5 mg via ORAL
  Filled 2016-09-16 (×7): qty 1

## 2016-09-16 MED ORDER — HEPARIN SOD (PORK) LOCK FLUSH 100 UNIT/ML IV SOLN: 250.0000 [IU] | INTRAVENOUS | Status: DC | PRN

## 2016-09-16 MED ORDER — SODIUM CHLORIDE 0.9% FLUSH
10.0000 mL | INTRAVENOUS | Status: AC | PRN
  Administered 2016-09-16: 10 mL

## 2016-09-16 NOTE — Progress Notes (Signed)
Patient's upper dentures removed and found to have a black substance on them. Unable to remove after 30 minutes of soaking. Left dentures soaking longer for day shift to attempt a second cleaning to remove black substance.

## 2016-09-16 NOTE — Clinical Social Work Note (Signed)
CSW received consult for concerns about self-care. As already noted in the chart, this patient is followed by APS/DSS. CSW is already following. Assessment is already in.   Santiago Bumpers, MSW, [place]

## 2016-09-16 NOTE — Plan of Care (Signed)
Problem: Safety: Goal: Ability to remain free from injury will improve Outcome: Progressing High fall risk with yellow arm band on and yellow sock on. Bed alarm is on.  Problem: Skin Integrity: Goal: Risk for impaired skin integrity will decrease Outcome: Progressing Pink foam to coccyx dry and intact.  Problem: Fluid Volume: Goal: Ability to maintain a balanced intake and output will improve Outcome: Progressing Zofran IV PRN given once for nausea; effective.  Problem: Bowel/Gastric: Goal: Will not experience complications related to bowel motility Outcome: Progressing Scheduled colace given per order.

## 2016-09-16 NOTE — Progress Notes (Signed)
Patient ID: Sherry [name], female   DOB: [birth year], 83 y.o.   MRN: [id number]  [facility] PROGRESS NOTE  Sherry [name]:[id number] DOB: 05-Apr-1927 DOA: 09/13/2016 PCP: [name], MD  HPI/Subjective: Patient currently denying any symptoms.  Objective: Vitals:   09/16/16 0415 09/16/16 0824  BP: 124/61 (!) 153/48  Pulse: 80 80  Resp: 20 18  Temp: 98 F (36.7 C) (!) 97.5 F (36.4 C)  SpO2: 98% 99%    Filed Weights   09/14/16 0215 09/15/16 0434 09/16/16 0500  Weight: 126 lb 8.7 oz (57.4 kg) 131 lb 9.8 oz (59.7 kg) 134 lb 7 oz (61 kg)    ROS: Review of Systems  Constitutional: Negative for chills and fever.  Eyes: Negative for blurred vision.  Respiratory: Negative for cough and shortness of breath.   Cardiovascular: Negative for chest pain.  Gastrointestinal: Negative for abdominal pain, constipation, diarrhea, nausea and vomiting.  Genitourinary: Negative for dysuria.  Musculoskeletal: Negative for joint pain.  Neurological: Negative for dizziness and headaches.   Exam: Physical Exam  HENT:  Nose: No mucosal edema.  Mouth/Throat: No oropharyngeal exudate or posterior oropharyngeal edema.  Eyes: Pupils are equal, round, and reactive to light. Conjunctivae and lids are normal.  Neck: No JVD present. Carotid bruit is not present. No edema present. No thyroid mass and no thyromegaly present.  Cardiovascular: Regular rhythm, S1 normal, S2 normal and normal heart sounds.  Exam reveals no gallop.   No murmur heard. Pulses:      Dorsalis pedis pulses are 1+ on the right side, and 1+ on the left side.  Respiratory: Breath sounds normal. No respiratory distress. She has no decreased breath sounds. She has no wheezes. She has no rhonchi. She has no rales.  GI: Soft. Bowel sounds are normal. There is no tenderness.  Musculoskeletal:       Right ankle: She exhibits swelling.       Left ankle: She exhibits swelling.  Lymphadenopathy:    She has no cervical adenopathy.   Neurological: She is alert. No cranial nerve deficit.  Skin: Skin is warm. No rash noted. Nails show no clubbing.  Psychiatric: She has a normal mood and affect.      Data Reviewed: Basic Metabolic Panel:  Recent Labs Lab 09/13/16 2200 09/13/16 2307 09/14/16 1032 09/16/16 0435  NA 138  --   --  136  K 2.5*  --  4.0 3.5  CL 105  --   --  106  CO2 23  --   --  25  GLUCOSE 143*  --   --  119*  BUN 12  --   --  8  CREATININE 0.96  --   --  0.93  CALCIUM 8.4*  --   --  7.7*  MG  --  1.6*  --   --    Liver Function Tests:  Recent Labs Lab 09/13/16 2200  AST 28  ALT 13*  ALKPHOS 86  BILITOT 0.6  PROT 6.9  ALBUMIN 3.2*   CBC:  Recent Labs Lab 09/13/16 2307 09/14/16 1446 09/15/16 1600 09/16/16 0435  WBC 5.7 8.5  --  5.4  NEUTROABS  --  7.2*  --   --   HGB 12.2 9.8* 9.2* 9.3*  HCT 37.1 28.2*  --  27.7*  MCV 83.9 84.1  --  85.1  PLT 268 230  --  273   Cardiac Enzymes:  Recent Labs Lab 09/13/16 2307 09/14/16 1032 09/14/16 1446 09/14/16 2033  TROPONINI <0.03 0.41* <0.03 <0.03   BNP (last 3 results)  Recent Labs  03/07/16 1559 08/21/16 0241 09/05/16 2002  BNP 87.0 94.0 248.0*     CBG:  Recent Labs Lab 09/15/16 0733 09/15/16 1648 09/15/16 2036 09/16/16 0742 09/16/16 1147  GLUCAP 265* 205* 135* 151* 135*    Recent Results (from the past 240 hour(s))  Urine culture     Status: None   Collection Time: 09/13/16 11:07 PM  Result Value Ref Range Status   Specimen Description URINE, RANDOM  Final   Special Requests NONE  Final   Culture   Final    NO GROWTH Performed at [facility], [place] [address], [place], [place] [address]    Report Status 09/15/2016 FINAL  Final  MRSA PCR Screening     Status: None   Collection Time: 09/14/16  2:12 AM  Result Value Ref Range Status   MRSA by PCR NEGATIVE NEGATIVE Final    Comment:        The GeneXpert MRSA Assay (FDA approved for NASAL specimens only), is one component of a comprehensive  MRSA colonization surveillance program. It is not intended to diagnose MRSA infection nor to guide or monitor treatment for MRSA infections.      Studies: Dg Chest Port 1 View  Result Date: 09/15/2016 CLINICAL DATA:  PICC line placement. Hx of diabetes, HTN. EXAM: PORTABLE CHEST 1 VIEW COMPARISON:  Chest x-ray dated 09/13/2016. FINDINGS: Right-sided PICC line in place. Tip is positioned at the level of the upper SVC, approximately 5-7 cm above the expected level of the cavoatrial junction. Heart size and mediastinal contours are stable. Atherosclerotic changes noted at the aortic arch. Lungs are clear. No pleural effusion or pneumothorax seen. IMPRESSION: 1. Right-sided PICC line in place, tip positioned at the level of the upper SVC and approximately 5-7 cm above the expected level of the cavoatrial junction. Consider advancing for more optimal radiographic positioning. 2. Aortic atherosclerosis. Electronically Signed   By: Franki Cabot M.D.   On: 09/15/2016 12:53    Scheduled Meds: . amiodarone  200 mg Oral Daily  . atorvastatin  20 mg Oral QHS  . docusate sodium  100 mg Oral BID  . ferrous sulfate  325 mg Oral Q breakfast  . gabapentin  300 mg Oral QHS  . insulin aspart  0-5 Units Subcutaneous QHS  . insulin aspart  0-9 Units Subcutaneous TID WC  . levothyroxine  50 mcg Oral QAC breakfast  . magnesium oxide  400 mg Oral Daily  . megestrol  40 mg Oral Daily  . metoCLOPramide  5 mg Oral BID  . multivitamin with minerals  1 tablet Oral Daily  . pantoprazole (PROTONIX) IV  40 mg Intravenous Q12H  . polyethylene glycol  17 g Oral Daily  . sertraline  25 mg Oral Daily  . sodium chloride flush  10-40 mL Intracatheter Q12H  . sucralfate  1 g Oral TID WC & HS  . tamsulosin  0.4 mg Oral Daily   Continuous Infusions:   Assessment/Plan:  1. Severe hypoglycemia. This has resolved. Continue sliding scale for now 2. Upper GI bleed with hematemesis. Patient had esophagitis on last  endoscopy. hemoglobin stable resume L a course 3. Hypothermia.  Pro calcitonin negative so less likely infection.  Temperature back to normal 4. History of a arrhythmia and pulmonary embolism.  On amiodarone. Resume eliquis 5. Diabetes with neuropathy on gabapentin 6. Hypothyroidism unspecified on levothyroxine 7. Hyperlipidemia unspecified on Lipitor 8.  Chronic kidney disease stage III 9. Severe hypokalemia now replaced 10. Hypomagnesemia replaced 11. Lactic acidosis improved with IV fluids  Social workers stated APS referral has been made. The patient will need placement and will not be discharged over the weekend. Guardianship will be looked into.  Code Status:     Code Status Orders        Start     Ordered   09/14/16 0218  Full code  Continuous     09/14/16 0217    Code Status History    Date Active Date Inactive Code Status Order ID Comments User Context   09/06/2016 12:38 AM 09/07/2016  8:31 PM Full Code [id number]  Lance Coon, MD Inpatient   08/29/2016  4:44 PM 08/31/2016  8:01 PM Full Code [id number]  Hillary Bow, MD ED   08/21/2016  6:15 AM 08/22/2016  7:28 PM Full Code [id number]  Lance Coon, MD Inpatient   07/28/2016  9:06 PM 08/02/2016  8:04 PM Full Code [id number]  Lance Coon, MD Inpatient   07/22/2016  4:13 AM 07/24/2016  5:43 PM Full Code [id number]  Harrie Foreman, MD Inpatient   07/12/2016 10:45 PM 07/18/2016  8:32 PM Full Code [id number]  Hugelmeyer, Alexis, DO Inpatient   05/28/2016 12:01 PM 06/02/2016  7:32 PM Full Code [id number]  Lorella Nimrod, MD Inpatient   04/24/2016  7:27 PM 04/27/2016  8:08 PM Full Code [id number]  Loletha Grayer, MD ED   04/19/2016 12:50 AM 04/22/2016 12:10 AM Full Code [id number]  Lance Coon, MD Inpatient   03/27/2016  9:39 PM 03/29/2016  9:41 PM Full Code [id number]  Fritzi Mandes, MD ED   03/12/2016 12:21 PM 03/13/2016  8:32 PM Full Code [id number]  Dustin Flock, MD Inpatient   03/09/2016  4:33 PM 03/12/2016 12:21 PM DNR [id number]  Nicholes Mango, MD  ED   02/26/2016  9:39 PM 03/01/2016 10:37 PM DNR [id number]  Karmen Bongo, MD Inpatient   02/26/2016  7:48 PM 02/26/2016  9:39 PM DNR [id number]  Dorie Rank, MD ED   01/31/2016  5:45 PM 02/07/2016 11:45 PM DNR [id number]  Flora Lipps, MD Inpatient   01/30/2016 11:19 PM 01/31/2016  5:44 PM Full Code [id number]  Hugelmeyer, Ubaldo Glassing, DO Inpatient   11/26/2014  3:52 PM 11/28/2014  3:07 PM Full Code [id number]  Max Sane, MD Inpatient   11/26/2014  8:59 AM 11/26/2014  3:52 PM Full Code [id number]  Max Sane, MD ED     Family Communication: as per critical care team today Disposition Plan: transferred to floor  Consultants:  Critical care specialist  Gastroenterology  Palliative care  Time spent: Full code  [place], [facility]

## 2016-09-16 NOTE — Progress Notes (Signed)
Slept at long intervals today; generalized weakness. Refused breakfast but drunk juice and ate well at lunch. No specific co's. Repeats requests/statements during conversation. Weeping clear serous liquid related to edema from RUE.

## 2016-09-17 LAB — GLUCOSE, CAPILLARY
Glucose-Capillary: 110 mg/dL — ABNORMAL HIGH (ref 65–99)
Glucose-Capillary: 213 mg/dL — ABNORMAL HIGH (ref 65–99)
Glucose-Capillary: 145 mg/dL — ABNORMAL HIGH (ref 65–99)
Glucose-Capillary: 178 mg/dL — ABNORMAL HIGH (ref 65–99)

## 2016-09-17 NOTE — Progress Notes (Signed)
No issues today. Eating well. Up to [facility] with 1+. VSS. Telemetry discontinued with sinus rhythm with 1st degrees block/asymptomatic. Awaiting disposition of pt.

## 2016-09-17 NOTE — Progress Notes (Signed)
Patient ID: Sherry [name], female   DOB: [birth year], 83 y.o.   MRN: [id number]  [facility] PROGRESS NOTE  IZABEL CHIM WVP:[id number] DOB: [birth year] DOA: 09/13/2016 PCP: [name], MD  HPI/Subjective: Patient currently has no symptoms  Objective: Vitals:   09/17/16 0426 09/17/16 0959  BP: (!) 124/44 (!) 120/43  Pulse: 77 88  Resp: 15 20  Temp: 98.9 F (37.2 C) 98 F (36.7 C)  SpO2: 98% 100%    Filed Weights   09/15/16 0434 09/16/16 0500 09/17/16 0500  Weight: 131 lb 9.8 oz (59.7 kg) 134 lb 7 oz (61 kg) 135 lb 5 oz (61.4 kg)    ROS: Review of Systems  Constitutional: Negative for chills and fever.  Eyes: Negative for blurred vision.  Respiratory: Negative for cough and shortness of breath.   Cardiovascular: Negative for chest pain.  Gastrointestinal: Negative for abdominal pain, constipation, diarrhea, nausea and vomiting.  Genitourinary: Negative for dysuria.  Musculoskeletal: Negative for joint pain.  Neurological: Negative for dizziness and headaches.   Exam: Physical Exam  HENT:  Nose: No mucosal edema.  Mouth/Throat: No oropharyngeal exudate or posterior oropharyngeal edema.  Eyes: Pupils are equal, round, and reactive to light. Conjunctivae and lids are normal.  Neck: No JVD present. Carotid bruit is not present. No edema present. No thyroid mass and no thyromegaly present.  Cardiovascular: Regular rhythm, S1 normal, S2 normal and normal heart sounds.  Exam reveals no gallop.   No murmur heard. Pulses:      Dorsalis pedis pulses are 1+ on the right side, and 1+ on the left side.  Respiratory: Breath sounds normal. No respiratory distress. She has no decreased breath sounds. She has no wheezes. She has no rhonchi. She has no rales.  GI: Soft. Bowel sounds are normal. There is no tenderness.  Musculoskeletal:       Right ankle: She exhibits swelling.       Left ankle: She exhibits swelling.  Lymphadenopathy:    She has no cervical adenopathy.   Neurological: She is alert. No cranial nerve deficit.  Skin: Skin is warm. No rash noted. Nails show no clubbing.  Psychiatric: She has a normal mood and affect.      Data Reviewed: Basic Metabolic Panel:  Recent Labs Lab 09/13/16 2200 09/13/16 2307 09/14/16 1032 09/16/16 0435  NA 138  --   --  136  K 2.5*  --  4.0 3.5  CL 105  --   --  106  CO2 23  --   --  25  GLUCOSE 143*  --   --  119*  BUN 12  --   --  8  CREATININE 0.96  --   --  0.93  CALCIUM 8.4*  --   --  7.7*  MG  --  1.6*  --   --    Liver Function Tests:  Recent Labs Lab 09/13/16 2200  AST 28  ALT 13*  ALKPHOS 86  BILITOT 0.6  PROT 6.9  ALBUMIN 3.2*   CBC:  Recent Labs Lab 09/13/16 2307 09/14/16 1446 09/15/16 1600 09/16/16 0435  WBC 5.7 8.5  --  5.4  NEUTROABS  --  7.2*  --   --   HGB 12.2 9.8* 9.2* 9.3*  HCT 37.1 28.2*  --  27.7*  MCV 83.9 84.1  --  85.1  PLT 268 230  --  273   Cardiac Enzymes:  Recent Labs Lab 09/13/16 2307 09/14/16 1032 09/14/16 1446 09/14/16 2033  TROPONINI <0.03 0.41* <0.03 <0.03   BNP (last 3 results)  Recent Labs  03/07/16 1559 08/21/16 0241 09/05/16 2002  BNP 87.0 94.0 248.0*     CBG:  Recent Labs Lab 09/16/16 0742 09/16/16 1147 09/16/16 1632 09/16/16 2130 09/17/16 0754  GLUCAP 151* 135* 187* 122* 110*    Recent Results (from the past 240 hour(s))  Urine culture     Status: None   Collection Time: 09/13/16 11:07 PM  Result Value Ref Range Status   Specimen Description URINE, RANDOM  Final   Special Requests NONE  Final   Culture   Final    NO GROWTH Performed at [facility], [place] [address], [place], [place] [address]    Report Status 09/15/2016 FINAL  Final  MRSA PCR Screening     Status: None   Collection Time: 09/14/16  2:12 AM  Result Value Ref Range Status   MRSA by PCR NEGATIVE NEGATIVE Final    Comment:        The GeneXpert MRSA Assay (FDA approved for NASAL specimens only), is one component of a comprehensive  MRSA colonization surveillance program. It is not intended to diagnose MRSA infection nor to guide or monitor treatment for MRSA infections.      Studies: Dg Chest Port 1 View  Result Date: 09/15/2016 CLINICAL DATA:  PICC line placement. Hx of diabetes, HTN. EXAM: PORTABLE CHEST 1 VIEW COMPARISON:  Chest x-ray dated 09/13/2016. FINDINGS: Right-sided PICC line in place. Tip is positioned at the level of the upper SVC, approximately 5-7 cm above the expected level of the cavoatrial junction. Heart size and mediastinal contours are stable. Atherosclerotic changes noted at the aortic arch. Lungs are clear. No pleural effusion or pneumothorax seen. IMPRESSION: 1. Right-sided PICC line in place, tip positioned at the level of the upper SVC and approximately 5-7 cm above the expected level of the cavoatrial junction. Consider advancing for more optimal radiographic positioning. 2. Aortic atherosclerosis. Electronically Signed   By: Franki Cabot M.D.   On: 09/15/2016 12:53    Scheduled Meds: . amiodarone  200 mg Oral Daily  . apixaban  2.5 mg Oral BID  . atorvastatin  20 mg Oral QHS  . docusate sodium  100 mg Oral BID  . ferrous sulfate  325 mg Oral Q breakfast  . gabapentin  300 mg Oral QHS  . insulin aspart  0-5 Units Subcutaneous QHS  . insulin aspart  0-9 Units Subcutaneous TID WC  . levothyroxine  50 mcg Oral QAC breakfast  . magnesium oxide  400 mg Oral Daily  . megestrol  40 mg Oral Daily  . metoCLOPramide  5 mg Oral BID  . multivitamin with minerals  1 tablet Oral Daily  . pantoprazole (PROTONIX) IV  40 mg Intravenous Q12H  . polyethylene glycol  17 g Oral Daily  . sertraline  25 mg Oral Daily  . sodium chloride flush  10-40 mL Intracatheter Q12H  . sucralfate  1 g Oral TID WC & HS  . tamsulosin  0.4 mg Oral Daily   Continuous Infusions:   Assessment/Plan:  1. Severe hypoglycemia. This has resolved. Continue sliding scale for now. Blood glucose stable 2. Upper GI bleed with  hematemesis. Patient had esophagitis on last endoscopy. hemoglobin stable , patient back on eliquis  3. hypothermia.  Pro calcitonin negative so less likely infection.  Temperature back to normal 4. History of a arrhythmia and pulmonary embolism.  On amiodarone. Resume eliquis 5. Diabetes with neuropathy  on gabapentin 6. Hypothyroidism unspecified on levothyroxine 7. Hyperlipidemia unspecified on Lipitor 8. Chronic kidney disease stage III 9. Severe hypokalemia now replaced 10. Hypomagnesemia replaced 11. Lactic acidosis improved with IV fluids  Social workers stated APS referral has been made. The patient will need placement and will not be discharged over the weekend. Guardianship will be looked into.  Code Status:     Code Status Orders        Start     Ordered   09/14/16 0218  Full code  Continuous     09/14/16 0217    Code Status History    Date Active Date Inactive Code Status Order ID Comments User Context   09/06/2016 12:38 AM 09/07/2016  8:31 PM Full Code [id number]  Lance Coon, MD Inpatient   08/29/2016  4:44 PM 08/31/2016  8:01 PM Full Code [id number]  Hillary Bow, MD ED   08/21/2016  6:15 AM 08/22/2016  7:28 PM Full Code [id number]  Lance Coon, MD Inpatient   07/28/2016  9:06 PM 08/02/2016  8:04 PM Full Code [id number]  Lance Coon, MD Inpatient   07/22/2016  4:13 AM 07/24/2016  5:43 PM Full Code [id number]  Harrie Foreman, MD Inpatient   07/12/2016 10:45 PM 07/18/2016  8:32 PM Full Code [id number]  Hugelmeyer, Alexis, DO Inpatient   05/28/2016 12:01 PM 06/02/2016  7:32 PM Full Code [id number]  Lorella Nimrod, MD Inpatient   04/24/2016  7:27 PM 04/27/2016  8:08 PM Full Code [id number]  Loletha Grayer, MD ED   04/19/2016 12:50 AM 04/22/2016 12:10 AM Full Code [id number]  Lance Coon, MD Inpatient   03/27/2016  9:39 PM 03/29/2016  9:41 PM Full Code [id number]  Fritzi Mandes, MD ED   03/12/2016 12:21 PM 03/13/2016  8:32 PM Full Code [id number]  Dustin Flock, MD Inpatient   03/09/2016  4:33  PM 03/12/2016 12:21 PM DNR [id number]  Nicholes Mango, MD ED   02/26/2016  9:39 PM 03/01/2016 10:37 PM DNR [id number]  Karmen Bongo, MD Inpatient   02/26/2016  7:48 PM 02/26/2016  9:39 PM DNR [id number]  Dorie Rank, MD ED   01/31/2016  5:45 PM 02/07/2016 11:45 PM DNR [id number]  Flora Lipps, MD Inpatient   01/30/2016 11:19 PM 01/31/2016  5:44 PM Full Code [id number]  Hugelmeyer, Ubaldo Glassing, DO Inpatient   11/26/2014  3:52 PM 11/28/2014  3:07 PM Full Code [id number]  Max Sane, MD Inpatient   11/26/2014  8:59 AM 11/26/2014  3:52 PM Full Code [id number]  Max Sane, MD ED     Family Communication: as per critical care team today Disposition Plan: transferred to floor  Consultants:  Critical care specialist  Gastroenterology  Palliative care  Time spent: Full code  [place], [facility]

## 2016-09-17 NOTE — Plan of Care (Signed)
Problem: Safety: Goal: Ability to remain free from injury will improve Outcome: Progressing Pt is high fall risk with yellow arm band and yellow socks on. Bed alarm is on.

## 2016-09-18 LAB — GLUCOSE, CAPILLARY
Glucose-Capillary: 188 mg/dL — ABNORMAL HIGH (ref 65–99)
Glucose-Capillary: 99 mg/dL (ref 65–99)
Glucose-Capillary: 149 mg/dL — ABNORMAL HIGH (ref 65–99)

## 2016-09-18 NOTE — Clinical Social Work Note (Signed)
CSW attempted to see patient and discuss SNF bed offers, patient was sleeping and would not wake up.  CSW will try at a later time, to discuss bed offers.  Jones Broom. [place], MSW, [place]  09/18/2016 4:23 PM

## 2016-09-18 NOTE — Care Management (Signed)
Open APS investigation.  Awaiting results of bed search.  [facility] has initiated guardianship proceedings.

## 2016-09-18 NOTE — Progress Notes (Signed)
Palliative Medicine RN Note: Outstanding Palliative Care order noted. Goals are set, and there appear to be no unmet palliative needs, as APS and SW are working out disposition. Please reconsult PMT if the situation changes or new palliative needs arise. PMT will not see at this time.  Marjie Skiff Mikaela Hilgeman, RN, BSN, [facility] 09/18/2016 9:50 AM Cell [phone] 8:00-4:00 Monday-Friday Office [phone]

## 2016-09-18 NOTE — Progress Notes (Signed)
Patient ID: Sherry [name], female   DOB: 10/07/1927, 83 y.o.   MRN: [id number]  [facility] PROGRESS NOTE  VERLEY PARISEAU OIZ:[id number] DOB: 1927/06/02 DOA: 09/13/2016 PCP: [name], MD  HPI/Subjective: Patient currently has no symptoms relating bed placement  Objective: Vitals:   09/18/16 0603 09/18/16 0800  BP: (!) 108/31 (!) 126/41  Pulse: 72 76  Resp: 20 18  Temp: 97.9 F (36.6 C) 98.2 F (36.8 C)  SpO2: 100% 99%    Filed Weights   09/15/16 0434 09/16/16 0500 09/17/16 0500  Weight: 131 lb 9.8 oz (59.7 kg) 134 lb 7 oz (61 kg) 135 lb 5 oz (61.4 kg)    ROS: Review of Systems  Constitutional: Negative for chills and fever.  Eyes: Negative for blurred vision.  Respiratory: Negative for cough and shortness of breath.   Cardiovascular: Negative for chest pain.  Gastrointestinal: Negative for abdominal pain, constipation, diarrhea, nausea and vomiting.  Genitourinary: Negative for dysuria.  Musculoskeletal: Negative for joint pain.  Neurological: Negative for dizziness and headaches.   Exam: Physical Exam  HENT:  Nose: No mucosal edema.  Mouth/Throat: No oropharyngeal exudate or posterior oropharyngeal edema.  Eyes: Pupils are equal, round, and reactive to light. Conjunctivae and lids are normal.  Neck: No JVD present. Carotid bruit is not present. No edema present. No thyroid mass and no thyromegaly present.  Cardiovascular: Regular rhythm, S1 normal, S2 normal and normal heart sounds.  Exam reveals no gallop.   No murmur heard. Pulses:      Dorsalis pedis pulses are 1+ on the right side, and 1+ on the left side.  Respiratory: Breath sounds normal. No respiratory distress. She has no decreased breath sounds. She has no wheezes. She has no rhonchi. She has no rales.  GI: Soft. Bowel sounds are normal. There is no tenderness.  Musculoskeletal:       Right ankle: She exhibits swelling.       Left ankle: She exhibits swelling.  Lymphadenopathy:    She has no  cervical adenopathy.  Neurological: She is alert. No cranial nerve deficit.  Skin: Skin is warm. No rash noted. Nails show no clubbing.  Psychiatric: She has a normal mood and affect.      Data Reviewed: Basic Metabolic Panel:  Recent Labs Lab 09/13/16 2200 09/13/16 2307 09/14/16 1032 09/16/16 0435  NA 138  --   --  136  K 2.5*  --  4.0 3.5  CL 105  --   --  106  CO2 23  --   --  25  GLUCOSE 143*  --   --  119*  BUN 12  --   --  8  CREATININE 0.96  --   --  0.93  CALCIUM 8.4*  --   --  7.7*  MG  --  1.6*  --   --    Liver Function Tests:  Recent Labs Lab 09/13/16 2200  AST 28  ALT 13*  ALKPHOS 86  BILITOT 0.6  PROT 6.9  ALBUMIN 3.2*   CBC:  Recent Labs Lab 09/13/16 2307 09/14/16 1446 09/15/16 1600 09/16/16 0435  WBC 5.7 8.5  --  5.4  NEUTROABS  --  7.2*  --   --   HGB 12.2 9.8* 9.2* 9.3*  HCT 37.1 28.2*  --  27.7*  MCV 83.9 84.1  --  85.1  PLT 268 230  --  273   Cardiac Enzymes:  Recent Labs Lab 09/13/16 2307 09/14/16 1032 09/14/16  1446 09/14/16 2033  TROPONINI <0.03 0.41* <0.03 <0.03   BNP (last 3 results)  Recent Labs  03/07/16 1559 08/21/16 0241 09/05/16 2002  BNP 87.0 94.0 248.0*     CBG:  Recent Labs Lab 09/17/16 0754 09/17/16 1200 09/17/16 1632 09/17/16 1958 09/18/16 1225  GLUCAP 110* 213* 145* 178* 188*    Recent Results (from the past 240 hour(s))  Urine culture     Status: None   Collection Time: 09/13/16 11:07 PM  Result Value Ref Range Status   Specimen Description URINE, RANDOM  Final   Special Requests NONE  Final   Culture   Final    NO GROWTH Performed at [facility], [place] [address] [place], [place] [address]    Report Status 09/15/2016 FINAL  Final  MRSA PCR Screening     Status: None   Collection Time: 09/14/16  2:12 AM  Result Value Ref Range Status   MRSA by PCR NEGATIVE NEGATIVE Final    Comment:        The GeneXpert MRSA Assay (FDA approved for NASAL specimens only), is one component  of a comprehensive MRSA colonization surveillance program. It is not intended to diagnose MRSA infection nor to guide or monitor treatment for MRSA infections.      Studies: No results found.  Scheduled Meds: . amiodarone  200 mg Oral Daily  . apixaban  2.5 mg Oral BID  . atorvastatin  20 mg Oral QHS  . docusate sodium  100 mg Oral BID  . ferrous sulfate  325 mg Oral Q breakfast  . gabapentin  300 mg Oral QHS  . insulin aspart  0-5 Units Subcutaneous QHS  . insulin aspart  0-9 Units Subcutaneous TID WC  . levothyroxine  50 mcg Oral QAC breakfast  . magnesium oxide  400 mg Oral Daily  . megestrol  40 mg Oral Daily  . metoCLOPramide  5 mg Oral BID  . multivitamin with minerals  1 tablet Oral Daily  . pantoprazole (PROTONIX) IV  40 mg Intravenous Q12H  . polyethylene glycol  17 g Oral Daily  . sertraline  25 mg Oral Daily  . sodium chloride flush  10-40 mL Intracatheter Q12H  . sucralfate  1 g Oral TID WC & HS  . tamsulosin  0.4 mg Oral Daily   Continuous Infusions:   Assessment/Plan:  1. Severe hypoglycemia. This has resolved. Continue sliding scale for now. Blood glucose stable 2. Upper GI bleed with hematemesis. Patient had esophagitis on last endoscopy. hemoglobin stable , patient back on eliquis  3. hypothermia.  Pro calcitonin negative so less likely infection.  Temperature back to normal 4. History of a arrhythmia and pulmonary embolism.  On amiodarone. Resume eliquis 5. Diabetes with neuropathy on gabapentin 6. Hypothyroidism unspecified on levothyroxine 7. Hyperlipidemia unspecified on Lipitor 8. Chronic kidney disease stage III 9. Severe hypokalemia now replaced 10. Hypomagnesemia replaced 11. Lactic acidosis improved with IV fluids  Social workers stated APS referral has been made. The patient will need placement and will not be discharged over the weekend. Guardianship will be looked into.  Code Status:     Code Status Orders        Start      Ordered   09/14/16 0218  Full code  Continuous     09/14/16 0217    Code Status History    Date Active Date Inactive Code Status Order ID Comments User Context   09/06/2016 12:38 AM 09/07/2016  8:31 PM  Full Code [id number]  Lance Coon, MD Inpatient   08/29/2016  4:44 PM 08/31/2016  8:01 PM Full Code [id number]  Hillary Bow, MD ED   08/21/2016  6:15 AM 08/22/2016  7:28 PM Full Code [id number]  Lance Coon, MD Inpatient   07/28/2016  9:06 PM 08/02/2016  8:04 PM Full Code [id number]  Lance Coon, MD Inpatient   07/22/2016  4:13 AM 07/24/2016  5:43 PM Full Code [id number]  Harrie Foreman, MD Inpatient   07/12/2016 10:45 PM 07/18/2016  8:32 PM Full Code [id number]  Hugelmeyer, Alexis, DO Inpatient   05/28/2016 12:01 PM 06/02/2016  7:32 PM Full Code [id number]  Lorella Nimrod, MD Inpatient   04/24/2016  7:27 PM 04/27/2016  8:08 PM Full Code [id number]  Loletha Grayer, MD ED   04/19/2016 12:50 AM 04/22/2016 12:10 AM Full Code [id number]  Lance Coon, MD Inpatient   03/27/2016  9:39 PM 03/29/2016  9:41 PM Full Code [id number]  Fritzi Mandes, MD ED   03/12/2016 12:21 PM 03/13/2016  8:32 PM Full Code [id number]  Dustin Flock, MD Inpatient   03/09/2016  4:33 PM 03/12/2016 12:21 PM DNR [id number]  Nicholes Mango, MD ED   02/26/2016  9:39 PM 03/01/2016 10:37 PM DNR [id number]  Karmen Bongo, MD Inpatient   02/26/2016  7:48 PM 02/26/2016  9:39 PM DNR [id number]  Dorie Rank, MD ED   01/31/2016  5:45 PM 02/07/2016 11:45 PM DNR [id number]  Flora Lipps, MD Inpatient   01/30/2016 11:19 PM 01/31/2016  5:44 PM Full Code [id number]  Hugelmeyer, Ubaldo Glassing, DO Inpatient   11/26/2014  3:52 PM 11/28/2014  3:07 PM Full Code [id number]  Max Sane, MD Inpatient   11/26/2014  8:59 AM 11/26/2014  3:52 PM Full Code [id number]  Max Sane, MD ED     Family Communication: as per critical care team today Disposition Plan: transferred to floor  Consultants:  Critical care specialist  Gastroenterology  Palliative care  Time spent: Full code  [place],  [facility]

## 2016-09-19 ENCOUNTER — Inpatient Hospital Stay: Payer: Medicare Other

## 2016-09-19 LAB — CBC
HEMATOCRIT: 24.2 % — AB (ref 35.0–47.0)
Hemoglobin: 8.2 g/dL — ABNORMAL LOW (ref 12.0–16.0)
MCH: 28.8 pg (ref 26.0–34.0)
MCHC: 34 g/dL (ref 32.0–36.0)
MCV: 84.7 fL (ref 80.0–100.0)
Platelets: 266 10*3/uL (ref 150–440)
RBC: 2.86 MIL/uL — ABNORMAL LOW (ref 3.80–5.20)
RDW: 15.4 % — AB (ref 11.5–14.5)
WBC: 5.6 10*3/uL (ref 3.6–11.0)

## 2016-09-19 LAB — GLUCOSE, CAPILLARY
Glucose-Capillary: 140 mg/dL — ABNORMAL HIGH (ref 65–99)
Glucose-Capillary: 163 mg/dL — ABNORMAL HIGH (ref 65–99)

## 2016-09-19 LAB — CREATININE, SERUM
CREATININE: 1.21 mg/dL — AB (ref 0.44–1.00)
GFR, EST AFRICAN AMERICAN: 46 mL/min — AB (ref >60)
GFR, EST NON AFRICAN AMERICAN: 39 mL/min — AB (ref >60)

## 2016-09-19 MED ORDER — OXYCODONE-ACETAMINOPHEN 7.5-325 MG PO TABS: 1.0000 | ORAL_TABLET | Freq: Two times a day (BID) | ORAL | 0 refills | Status: DC | PRN

## 2016-09-19 NOTE — Discharge Instructions (Signed)
[place] at [place]:  [place]:  Stable  ACTIVITY:  Activity as tolerated  OXYGEN:  Home Oxygen: No.   Oxygen Delivery: room air  DISCHARGE LOCATION:  nursing home    ADDITIONAL DISCHARGE INSTRUCTION:   If you experience worsening of your admission symptoms, develop shortness of breath, life threatening emergency, suicidal or homicidal thoughts you must seek medical attention immediately by calling 911 or calling your MD immediately  if symptoms less severe.  You Must read complete instructions/literature along with all the possible adverse reactions/side effects for all the Medicines you take and that have been prescribed to you. Take any new Medicines after you have completely understood and accpet all the possible adverse reactions/side effects.   Please note  You were cared for by a hospitalist during your hospital stay. If you have any questions about your discharge medications or the care you received while you were in the hospital after you are discharged, you can call the unit and asked to speak with the hospitalist on call if the hospitalist that took care of you is not available. Once you are discharged, your primary care physician will handle any further medical issues. Please note that NO REFILLS for any discharge medications will be authorized once you are discharged, as it is imperative that you return to your primary care physician (or establish a relationship with a primary care physician if you do not have one) for your aftercare needs so that they can reassess your need for medications and monitor your lab values.

## 2016-09-19 NOTE — Clinical Social Work Note (Addendum)
CSW spoke with patient and she agreed to go to [facility] for short term rehab and wound care.  CSW contacted [facility] and they can accept patient once she is medically ready for discharge and orders have been received.  CSW attempted to call patient's son, and recorder said all circuits are busy.  CSW contacted APS Worker Otilio Connors [phone] who was updated that patient agreed to SNF.  CSW attempted to contact patient's son, to inform him that patient will be going to [facility] today per her decision.  Patient's son's phone did not have voice mail set up.  CSW then attempted to contact patient's daughter and CSW left a message on her voice mail to inform her that patient has chosen to go to SNF at [facility] and was discharging today.    Patient to be d/c'ed today to [facility].  Patient agreeable to plans will transport via ems RN to call report [phone].  Jones Broom. Mullen, MSW, [place]  09/19/2016 11:39 AM

## 2016-09-19 NOTE — Discharge Summary (Signed)
[place] at [facility], 83 y.o., DOB [birth year], MRN [id number]. Admission date: 09/13/2016 Discharge Date 09/19/2016 Primary MD [name], MD Admitting Physician Harrie Foreman, MD  Admission Diagnosis  Hypokalemia [E87.6] Hypoglycemia [E16.2] Acute encephalopathy [G93.40] Hypothermia, initial encounter [T68.XXXA]  Discharge Diagnosis   Active Problems: Acute encephalopahy due to severe hypoglycemia Hypothermia Hematemesis with possible upper GI bleed diabetes with neuropathy Hypothyroidism on stress Hyperlipidemia CKD3 Hypokalemia  hypomagnesemia Left upper extremity swelling History of pulmonary embolism   Hospital Course  The patient presents emergency department with altered mental status from home. [name] family states that she has become less responsive and confused when she is awake. She is found to be hypothermic in the emergency department to a rectal temperature of 19F. She was immediately placed on a warming blanket. Her blood sugar was as low as 67. She received 1.5 A of the 50 in the emergency department but continued to require a D10 drip. Patient's blood sugars subsequently improved. Her hypothermia was thought to be related to her hypoglycemia and no evidence of infection was noted. Patient also had episode of hematemesis was seen by GI and recommended patient's eliquis has been resumed. Patient currently stable and has no complaints            Consults  None  Significant Tests:  See full reports for all details    Ct Head Wo Contrast  Result Date: 09/05/2016 CLINICAL DATA:  83 year old female with shortness of breath and altered mental status. Found to be hypoglycemic, initial CBG 29. EXAM: CT HEAD WITHOUT CONTRAST TECHNIQUE: Contiguous axial images were obtained from the base of the skull through the vertex without intravenous contrast. COMPARISON:  08/29/2016 and earlier. FINDINGS: Brain: Small  areas of cortical encephalomalacia in the left parietal lobe and posterior left temporal lobe are stable. Patchy and confluent bilateral cerebral white matter hypodensity appears stable. Stable cerebral volume. No acute intracranial hemorrhage identified. No midline shift, mass effect, or evidence of intracranial mass lesion. Stable ventricle size and configuration. No cortically based acute infarct identified. Vascular: Calcified atherosclerosis at the skull base. No suspicious intracranial vascular hyperdensity. Skull: Osteopenia.  No acute osseous abnormality identified. Sinuses/Orbits: Visualized paranasal sinuses and mastoids are stable and well pneumatized. Other: No acute orbit or scalp soft tissue findings. IMPRESSION: 1.  No acute intracranial abnormality. 2. Stable non contrast CT appearance of the brain with chronic small and medium-sized vessel ischemia, worse in the left hemisphere. Electronically Signed   By: Genevie Ann M.D.   On: 09/05/2016 20:52   Ct Head Wo Contrast  Result Date: 08/29/2016 CLINICAL DATA:  Altered consciousness.  Weakness. EXAM: CT HEAD WITHOUT CONTRAST TECHNIQUE: Contiguous axial images were obtained from the base of the skull through the vertex without intravenous contrast. COMPARISON:  07/12/2016 FINDINGS: Brain: Expected cerebral and cerebellar volume loss for age. Moderate low density in the periventricular white matter likely related to small vessel disease. Remote left parietal cortically based infarct. No mass lesion, hemorrhage, hydrocephalus, acute infarct, intra-axial, or extra-axial fluid collection. Vascular: Intracranial atherosclerosis. Skull: Normal Sinuses/Orbits: Normal imaged portions of the orbits and globes. Clear paranasal sinuses and mastoid air cells. Other: None. IMPRESSION: 1.  No acute intracranial abnormality. 2.  Cerebral atrophy and small vessel ischemic change. 3. Remote left parietal infarct. Electronically Signed   By: Abigail Miyamoto M.D.   On:  08/29/2016 13:54   Dg Chest Port 1 View  Result Date: 09/15/2016 CLINICAL DATA:  PICC line placement. Hx of diabetes, HTN. EXAM: PORTABLE CHEST 1 VIEW COMPARISON:  Chest x-ray dated 09/13/2016. FINDINGS: Right-sided PICC line in place. Tip is positioned at the level of the upper SVC, approximately 5-7 cm above the expected level of the cavoatrial junction. Heart size and mediastinal contours are stable. Atherosclerotic changes noted at the aortic arch. Lungs are clear. No pleural effusion or pneumothorax seen. IMPRESSION: 1. Right-sided PICC line in place, tip positioned at the level of the upper SVC and approximately 5-7 cm above the expected level of the cavoatrial junction. Consider advancing for more optimal radiographic positioning. 2. Aortic atherosclerosis. Electronically Signed   By: Franki Cabot M.D.   On: 09/15/2016 12:53   Dg Chest Port 1 View  Result Date: 09/13/2016 CLINICAL DATA:  Sepsis. EXAM: PORTABLE CHEST 1 VIEW COMPARISON:  Most recent chest radiograph 08/28/2016 FINDINGS: Unchanged heart size and mediastinal contours. Improved bibasilar aeration with decreasing pleural effusions and atelectasis. No pulmonary edema. No pneumothorax, skin fold projects over the right chest. No confluent airspace disease or pneumothorax. Bones are under mineralized with chronic degenerative change of the shoulders. IMPRESSION: Improved bibasilar aeration with decreasing pleural effusions and bibasilar atelectasis. Electronically Signed   By: Jeb Levering M.D.   On: 09/13/2016 23:01   Dg Chest Port 1 View  Result Date: 09/05/2016 CLINICAL DATA:  Shortness of Breath. EXAM: PORTABLE CHEST 1 VIEW COMPARISON:  08/29/2016 FINDINGS: Heart is normal size. There are small bilateral pleural effusions with bibasilar opacities, likely atelectasis. No acute bony abnormality. IMPRESSION: Small bilateral effusions with bibasilar atelectasis. Electronically Signed   By: Rolm Baptise M.D.   On: 09/05/2016 20:32   Dg  Chest Portable 1 View  Result Date: 08/29/2016 CLINICAL DATA:  Weakness. EXAM: PORTABLE CHEST 1 VIEW COMPARISON:  August 21, 2016 FINDINGS: Skin folds over the left chest with no pneumothorax. Mild bibasilar atelectasis. No other interval changes or acute abnormalities. IMPRESSION: No active disease. Electronically Signed   By: Dorise Bullion III M.D   On: 08/29/2016 13:40   Dg Chest Port 1 View  Result Date: 08/21/2016 CLINICAL DATA:  83 y/o  F; weakness today. EXAM: PORTABLE CHEST 1 VIEW COMPARISON:  07/22/2016 chest radiograph FINDINGS: Stable cardiac silhouette within normal limits given projection and technique. Aortic atherosclerosis with calcification. Linear opacities in the lung bases compatible with minor atelectasis. No focal consolidation. No pleural effusion or pneumothorax. Anterior cervical fusion hardware in no acute. No acute osseous abnormality is evident. IMPRESSION: Minor atelectasis in lung bases. No focal consolidation. Aortic atherosclerosis. Stable cardiac silhouette. Electronically Signed   By: Kristine Garbe M.D.   On: 08/21/2016 03:12       Today   Subjective:   Sherry [name]  Complains of swelling of the left upper extremity  Objective:   Blood pressure (!) 135/44, pulse 80, temperature 98.9 F (37.2 C), temperature source Oral, resp. rate 18, height 5\' 4"  (1.626 m), weight 135 lb 5 oz (61.4 kg), SpO2 99 %.  .  Intake/Output Summary (Last 24 hours) at 09/19/16 1214 Last data filed at 09/19/16 0800  Gross per 24 hour  Intake              730 ml  Output                0 ml  Net              730 ml    Exam VITAL SIGNS: Blood pressure (!) 135/44, pulse 80, temperature 98.9  F (37.2 C), temperature source Oral, resp. rate 18, height 5\' 4"  (1.626 m), weight 135 lb 5 oz (61.4 kg), SpO2 99 %.  GENERAL:  83 y.o.-year-old patient lying in the bed with no acute distress.  EYES: Pupils equal, round, reactive to light and accommodation. No scleral icterus.  Extraocular muscles intact.  HEENT: Head atraumatic, normocephalic. Oropharynx and nasopharynx clear.  NECK:  Supple, no jugular venous distention. No thyroid enlargement, no tenderness.  LUNGS: Normal breath sounds bilaterally, no wheezing, rales,rhonchi or crepitation. No use of accessory muscles of respiration.  CARDIOVASCULAR: S1, S2 normal. No murmurs, rubs, or gallops.  ABDOMEN: Soft, nontender, nondistended. Bowel sounds present. No organomegaly or mass.  EXTREMITIES: No pedal edema, cyanosis, or clubbing. Left upper extremity swelling NEUROLOGIC: Cranial nerves II through XII are intact. Muscle strength 5/5 in all extremities. Sensation intact. Gait not checked.  PSYCHIATRIC: The patient is alert and oriented x 3.  SKIN: No obvious rash, lesion, or ulcer.   Data Review     CBC w Diff: Lab Results  Component Value Date   WBC 5.6 09/19/2016   HGB 8.2 (L) 09/19/2016   HGB 12.7 10/21/2012   HCT 24.2 (L) 09/19/2016   HCT 38.0 10/21/2012   PLT 266 09/19/2016   PLT 226 10/21/2012   LYMPHOPCT 12 09/14/2016   MONOPCT 2 09/14/2016   EOSPCT 0 09/14/2016   BASOPCT 1 09/14/2016   CMP: Lab Results  Component Value Date   NA 136 09/16/2016   NA 139 10/21/2012   K 3.5 09/16/2016   K 4.5 10/21/2012   CL 106 09/16/2016   CL 108 (H) 10/21/2012   CO2 25 09/16/2016   CO2 24 10/21/2012   BUN 8 09/16/2016   BUN 10 10/21/2012   CREATININE 1.21 (H) 09/19/2016   CREATININE 0.81 10/21/2012   PROT 6.9 09/13/2016   PROT 6.4 10/21/2012   ALBUMIN 3.2 (L) 09/13/2016   ALBUMIN 3.1 (L) 10/21/2012   BILITOT 0.6 09/13/2016   BILITOT 0.3 10/21/2012   ALKPHOS 86 09/13/2016   ALKPHOS 100 10/21/2012   AST 28 09/13/2016   AST 30 10/21/2012   ALT 13 (L) 09/13/2016   ALT 18 10/21/2012  .  Micro Results Recent Results (from the past 240 hour(s))  Urine culture     Status: None   Collection Time: 09/13/16 11:07 PM  Result Value Ref Range Status   Specimen Description URINE, RANDOM  Final    Special Requests NONE  Final   Culture   Final    NO GROWTH Performed at [facility], [address] [place], [place] [address]    Report Status 09/15/2016 FINAL  Final  MRSA PCR Screening     Status: None   Collection Time: 09/14/16  2:12 AM  Result Value Ref Range Status   MRSA by PCR NEGATIVE NEGATIVE Final    Comment:        The GeneXpert MRSA Assay (FDA approved for NASAL specimens only), is one component of a comprehensive MRSA colonization surveillance program. It is not intended to diagnose MRSA infection nor to guide or monitor treatment for MRSA infections.         Code Status Orders        Start     Ordered   09/14/16 0218  Full code  Continuous     09/14/16 0217    Code Status History    Date Active Date Inactive Code Status Order ID Comments User Context   09/06/2016 12:38  AM 09/07/2016  8:31 PM Full Code [id number]  Lance Coon, MD Inpatient   08/29/2016  4:44 PM 08/31/2016  8:01 PM Full Code [id number]  Hillary Bow, MD ED   08/21/2016  6:15 AM 08/22/2016  7:28 PM Full Code [id number]  Lance Coon, MD Inpatient   07/28/2016  9:06 PM 08/02/2016  8:04 PM Full Code [id number]  Lance Coon, MD Inpatient   07/22/2016  4:13 AM 07/24/2016  5:43 PM Full Code [id number]  Harrie Foreman, MD Inpatient   07/12/2016 10:45 PM 07/18/2016  8:32 PM Full Code [id number]  Hugelmeyer, Alexis, DO Inpatient   05/28/2016 12:01 PM 06/02/2016  7:32 PM Full Code [id number]  Lorella Nimrod, MD Inpatient   04/24/2016  7:27 PM 04/27/2016  8:08 PM Full Code [id number]  Loletha Grayer, MD ED   04/19/2016 12:50 AM 04/22/2016 12:10 AM Full Code [id number]  Lance Coon, MD Inpatient   03/27/2016  9:39 PM 03/29/2016  9:41 PM Full Code [id number]  Fritzi Mandes, MD ED   03/12/2016 12:21 PM 03/13/2016  8:32 PM Full Code [id number]  Dustin Flock, MD Inpatient   03/09/2016  4:33 PM 03/12/2016 12:21 PM DNR [id number]  Nicholes Mango, MD ED   02/26/2016  9:39 PM 03/01/2016 10:37 PM DNR [id number]  Karmen Bongo,  MD Inpatient   02/26/2016  7:48 PM 02/26/2016  9:39 PM DNR [id number]  Dorie Rank, MD ED   01/31/2016  5:45 PM 02/07/2016 11:45 PM DNR [id number]  Flora Lipps, MD Inpatient   01/30/2016 11:19 PM 01/31/2016  5:44 PM Full Code [id number]  Hugelmeyer, Ubaldo Glassing, DO Inpatient   11/26/2014  3:52 PM 11/28/2014  3:07 PM Full Code [id number]  Max Sane, MD Inpatient   11/26/2014  8:59 AM 11/26/2014  3:52 PM Full Code [id number]  Max Sane, MD ED            Discharge Medications   Allergies as of 09/19/2016   No Known Allergies     Medication List    TAKE these medications   amiodarone 200 MG tablet Commonly known as:  PACERONE Take 1 tablet (200 mg total) by mouth daily.   apixaban 2.5 MG Tabs tablet Commonly known as:  ELIQUIS Take 1 tablet (2.5 mg total) by mouth 2 (two) times daily.   atorvastatin 20 MG tablet Commonly known as:  LIPITOR Take 20 mg by mouth at bedtime.   docusate sodium 100 MG capsule Commonly known as:  COLACE Take 1 capsule (100 mg total) by mouth 2 (two) times daily.   ferrous sulfate 325 (65 FE) MG tablet Take 1 tablet (325 mg total) by mouth daily with breakfast.   gabapentin 300 MG capsule Commonly known as:  NEURONTIN Take 300 mg by mouth at bedtime.   magnesium oxide 400 (241.3 Mg) MG tablet Commonly known as:  MAG-OX Take 1 tablet (400 mg total) by mouth daily.   megestrol 40 MG tablet Commonly known as:  MEGACE Take 1 tablet (40 mg total) by mouth daily.   metoCLOPramide 5 MG tablet Commonly known as:  REGLAN Take 1 tablet (5 mg total) by mouth 2 (two) times daily.   multivitamin with minerals Tabs tablet Take 1 tablet by mouth daily.   ondansetron 4 MG disintegrating tablet Commonly known as:  ZOFRAN-ODT Take 4 mg by mouth every 6 (six) hours as needed for nausea or vomiting.   oxyCODONE-acetaminophen 7.5-325 MG tablet Commonly known as:  PERCOCET Take 1 tablet by mouth 2 (two) times daily as needed for severe pain.  pantoprazole 40  MG tablet Commonly known as:  PROTONIX Take 1 tablet (40 mg total) by mouth 2 (two) times daily.   polyethylene glycol packet Commonly known as:  MIRALAX Take 17 g by mouth daily.   sertraline 25 MG tablet Commonly known as:  ZOLOFT Take 1 tablet (25 mg total) by mouth daily.   sucralfate 1 GM/10ML suspension Commonly known as:  CARAFATE Take 10 mLs (1 g total) by mouth 4 (four) times daily -  with meals and at bedtime.   tamsulosin 0.4 MG Caps capsule Commonly known as:  FLOMAX Take 1 capsule (0.4 mg total) by mouth daily. What changed:  when to take this            Discharge Care Instructions        Start     Ordered   09/19/16 0000  oxyCODONE-acetaminophen (PERCOCET) 7.5-325 MG tablet  2 times daily PRN     09/19/16 1214         Total Time in preparing paper work, data evaluation and todays exam - 35 minutes  Dustin Flock M.D on 09/19/2016 at 12:14 PM  [facility]   Office  [phone]

## 2016-09-19 NOTE — Progress Notes (Signed)
Patient discharged to [facility] per MD order. Report called to Vivien Rota at facility. EMS called for transportation.

## 2016-09-19 NOTE — Plan of Care (Signed)
Problem: Safety: Goal: Ability to remain free from injury will improve Outcome: Progressing Patient is a high fall risk with yellow arm band and yellow socks on. Bed alarm is on.

## 2016-09-19 NOTE — Care Management Important Message (Signed)
Important Message  Patient Details  Name: Sherry [name] MRN: [id number] Date of Birth: [birth year]   Medicare Important Message Given:  Yes    Shelbie Ammons, RN 09/19/2016, 8:04 AM

## 2016-10-04 ENCOUNTER — Emergency Department
Admission: EM | Admit: 2016-10-04 | Discharge: 2016-10-04 | Disposition: A | Discharge status: Home / Self Care | Payer: Medicare Other | Attending: Emergency Medicine | Admitting: Emergency Medicine

## 2016-10-04 ENCOUNTER — Emergency Department: Payer: Medicare Other

## 2016-10-04 DIAGNOSIS — N183 Chronic kidney disease, stage 3 (moderate): Secondary | ICD-10-CM | POA: Insufficient documentation

## 2016-10-04 DIAGNOSIS — Z794 Long term (current) use of insulin: Secondary | ICD-10-CM | POA: Diagnosis not present

## 2016-10-04 DIAGNOSIS — R110 Nausea: Secondary | ICD-10-CM | POA: Diagnosis not present

## 2016-10-04 DIAGNOSIS — Z7901 Long term (current) use of anticoagulants: Secondary | ICD-10-CM | POA: Insufficient documentation

## 2016-10-04 DIAGNOSIS — E876 Hypokalemia: Secondary | ICD-10-CM | POA: Diagnosis not present

## 2016-10-04 DIAGNOSIS — R2232 Localized swelling, mass and lump, left upper limb: Secondary | ICD-10-CM | POA: Diagnosis present

## 2016-10-04 DIAGNOSIS — I130 Hypertensive heart and chronic kidney disease with heart failure and stage 1 through stage 4 chronic kidney disease, or unspecified chronic kidney disease: Secondary | ICD-10-CM | POA: Insufficient documentation

## 2016-10-04 DIAGNOSIS — Z79899 Other long term (current) drug therapy: Secondary | ICD-10-CM | POA: Insufficient documentation

## 2016-10-04 DIAGNOSIS — E1122 Type 2 diabetes mellitus with diabetic chronic kidney disease: Secondary | ICD-10-CM | POA: Insufficient documentation

## 2016-10-04 DIAGNOSIS — I5022 Chronic systolic (congestive) heart failure: Secondary | ICD-10-CM | POA: Diagnosis not present

## 2016-10-04 DIAGNOSIS — L03114 Cellulitis of left upper limb: Secondary | ICD-10-CM | POA: Diagnosis not present

## 2016-10-04 LAB — CBC WITH DIFFERENTIAL/PLATELET
Basophils Absolute: 0 10*3/uL (ref 0–0.1)
Basophils Relative: 1 %
EOS PCT: 0 %
Eosinophils Absolute: 0 10*3/uL (ref 0–0.7)
HCT: 29.9 % — ABNORMAL LOW (ref 35.0–47.0)
Hemoglobin: 9.9 g/dL — ABNORMAL LOW (ref 12.0–16.0)
LYMPHS ABS: 1.9 10*3/uL (ref 1.0–3.6)
Lymphocytes Relative: 38 %
MCH: 27.8 pg (ref 26.0–34.0)
MCHC: 33 g/dL (ref 32.0–36.0)
MCV: 84.1 fL (ref 80.0–100.0)
MONO ABS: 0.3 10*3/uL (ref 0.2–0.9)
MONOS PCT: 6 %
Neutro Abs: 2.8 10*3/uL (ref 1.4–6.5)
Neutrophils Relative %: 55 %
PLATELETS: 349 10*3/uL (ref 150–440)
RBC: 3.55 MIL/uL — ABNORMAL LOW (ref 3.80–5.20)
RDW: 15.2 % — AB (ref 11.5–14.5)
WBC: 5.1 10*3/uL (ref 3.6–11.0)

## 2016-10-04 LAB — COMPREHENSIVE METABOLIC PANEL
ALT: 10 U/L — ABNORMAL LOW (ref 14–54)
ANION GAP: 14 (ref 5–15)
AST: 13 U/L — ABNORMAL LOW (ref 15–41)
Albumin: 2.4 g/dL — ABNORMAL LOW (ref 3.5–5.0)
Alkaline Phosphatase: 58 U/L (ref 38–126)
BUN: 15 mg/dL (ref 6–20)
CHLORIDE: 109 mmol/L (ref 101–111)
CO2: 21 mmol/L — AB (ref 22–32)
Calcium: 8.4 mg/dL — ABNORMAL LOW (ref 8.9–10.3)
Creatinine, Ser: 1.12 mg/dL — ABNORMAL HIGH (ref 0.44–1.00)
GFR calc non Af Amer: 43 mL/min — ABNORMAL LOW (ref >60)
GFR, EST AFRICAN AMERICAN: 50 mL/min — AB (ref >60)
Glucose, Bld: 139 mg/dL — ABNORMAL HIGH (ref 65–99)
Potassium: 2.7 mmol/L — CL (ref 3.5–5.1)
SODIUM: 144 mmol/L (ref 135–145)
Total Bilirubin: 0.9 mg/dL (ref 0.3–1.2)
Total Protein: 5.4 g/dL — ABNORMAL LOW (ref 6.5–8.1)

## 2016-10-04 LAB — TYPE AND SCREEN
ABO/RH(D): O POS
ANTIBODY SCREEN: NEGATIVE

## 2016-10-04 LAB — PROTIME-INR
INR: 0.99
Prothrombin Time: 13 seconds (ref 11.4–15.2)

## 2016-10-04 LAB — APTT: APTT: 25 s (ref 24–36)

## 2016-10-04 MED ORDER — POTASSIUM CHLORIDE CRYS ER 20 MEQ PO TBCR
20.0000 meq | EXTENDED_RELEASE_TABLET | Freq: Once | ORAL | Status: AC
  Administered 2016-10-04: 20 meq via ORAL

## 2016-10-04 MED ORDER — CEPHALEXIN 500 MG PO CAPS
500.0000 mg | ORAL_CAPSULE | Freq: Once | ORAL | Status: DC
Start: 2016-10-04 — End: 2016-10-04

## 2016-10-04 MED ORDER — POTASSIUM CHLORIDE CRYS ER 20 MEQ PO TBCR
40.0000 meq | EXTENDED_RELEASE_TABLET | Freq: Once | ORAL | Status: DC
  Filled 2016-10-04: qty 2

## 2016-10-04 MED ORDER — DOXYCYCLINE HYCLATE 100 MG PO CAPS: 100.0000 mg | ORAL_CAPSULE | Freq: Two times a day (BID) | ORAL | 0 refills | Status: DC

## 2016-10-04 MED ORDER — POTASSIUM CHLORIDE 20 MEQ/15ML (10%) PO SOLN
20.0000 meq | Freq: Once | ORAL | Status: AC
  Administered 2016-10-04: 20 meq via ORAL
  Filled 2016-10-04: qty 15

## 2016-10-04 MED ORDER — POTASSIUM CHLORIDE ER 10 MEQ PO TBCR: 40.0000 meq | EXTENDED_RELEASE_TABLET | Freq: Two times a day (BID) | ORAL | 0 refills | Status: DC

## 2016-10-04 MED ORDER — DOXYCYCLINE HYCLATE 100 MG PO TABS
100.0000 mg | ORAL_TABLET | Freq: Once | ORAL | Status: AC
  Administered 2016-10-04: 100 mg via ORAL
  Filled 2016-10-04: qty 1

## 2016-10-04 MED ORDER — POTASSIUM CHLORIDE 40 MEQ/15ML (20%) PO SOLN: 40.0000 meq | Freq: Two times a day (BID) | ORAL | 0 refills | Status: DC

## 2016-10-04 NOTE — ED Triage Notes (Signed)
Patient alert and oriented to self, situation, and time

## 2016-10-04 NOTE — ED Notes (Signed)
Attempted to call report 3 times to facility, secretary said she would transfer me and the phone hung up all 3 times.

## 2016-10-04 NOTE — ED Notes (Signed)
Report called to [place] at [facility]; d/c instructions to be sent with pt

## 2016-10-04 NOTE — ED Provider Notes (Addendum)
[facility] Emergency Department Provider Note  ____________________________________________   First MD Initiated Contact with Patient 10/04/16 1516     (approximate)  I have reviewed the triage vital signs and the nursing notes.   HISTORY  Chief Complaint Nausea (unexplained and persistant)   HPI Sherry [name] is a 83 y.o. female with a history of chronic kidney disease as well as pulmonary embolism on eliquis who is presenting to the emergency department today with 2 weeks of worsening left upper extremity swelling. Transferred via ambulance with report of nausea as well. However, patient denies any nausea or vomiting at this time. Denies any pain. Does not report fever or chills.patient also with recent blood work showing anemia.  Past Medical History:  Diagnosis Date  . Anxiety   . Arthritis   . Chronic kidney disease   . Diabetes mellitus without complication ([place])   . Dizziness   . Dyspnea   . GERD (gastroesophageal reflux disease)   . Hyperlipidemia   . Hypertension   . Pulmonary embolism ([place]) 2016    Patient Active Problem List   Diagnosis Date Noted  . Hematemesis without nausea   . Hypothermia 09/13/2016  . Acute encephalopathy 09/05/2016  . Hypoglycemia 09/05/2016  . Chronic systolic CHF (congestive heart failure) ([place]) 09/05/2016  . Syncope 08/29/2016  . Chronic idiopathic constipation   . HLD (hyperlipidemia) 07/28/2016  . Fecal impaction of colon ([place]) 07/28/2016  . Fecal impaction ([place]) 07/28/2016  . Atrial fibrillation with RVR ([place]) 07/22/2016  . Dehydration   . Renal mass   . Chest pain, rule out acute myocardial infarction 07/12/2016  . Reactive depression   . Esophagitis determined by endoscopy 05/31/2016  . Hyperglycemia   . Hypokalemia   . Cystitis   . HH (hiatus hernia)   . Hiatal hernia with gastroesophageal reflux disease without esophagitis   . GI bleed 05/28/2016  . Upper GI bleeding 05/28/2016  . Pressure  injury of skin 05/28/2016  . Atrial flutter ([place])   . Gastroenteritis 04/24/2016  . Aphasia 04/18/2016  . Nausea & vomiting 03/27/2016  . Advance care planning   . UTI (urinary tract infection) 03/12/2016  . Chest pain 03/09/2016  . PE (pulmonary thromboembolism) ([place]) 02/26/2016  . Diabetes mellitus type 2, insulin dependent ([place]) 02/26/2016  . Anemia 02/26/2016  . Chronic kidney disease (CKD), stage III (moderate) 02/26/2016  . Essential hypertension 02/26/2016  . Goals of care, counseling/discussion   . Adult failure to thrive syndrome   . Palliative care by specialist   . Sepsis ([place]) 11/26/2014    Past Surgical History:  Procedure Laterality Date  . APPENDECTOMY    . BACK SURGERY    . ESOPHAGOGASTRODUODENOSCOPY N/A 05/29/2016   Procedure: ESOPHAGOGASTRODUODENOSCOPY (EGD);  Surgeon: Clarene Essex, MD;  Location: [place];  Service: Gastroenterology;  Laterality: N/A;  . SHOULDER SURGERY Left     Prior to Admission medications   Medication Sig Start Date End Date Taking? Authorizing Provider  amiodarone (PACERONE) 200 MG tablet Take 1 tablet (200 mg total) by mouth daily. 09/08/16   Vaughan Basta, MD  apixaban (ELIQUIS) 2.5 MG TABS tablet Take 1 tablet (2.5 mg total) by mouth 2 (two) times daily. 07/23/16   Gladstone Lighter, MD  atorvastatin (LIPITOR) 20 MG tablet Take 20 mg by mouth at bedtime.     [provider]  docusate sodium (COLACE) 100 MG capsule Take 1 capsule (100 mg total) by mouth 2 (two) times daily. 08/02/16   Kalisetti,  Hart Rochester, MD  ferrous sulfate 325 (65 FE) MG tablet Take 1 tablet (325 mg total) by mouth daily with breakfast. 06/03/16   Lorella Nimrod, MD  gabapentin (NEURONTIN) 300 MG capsule Take 300 mg by mouth at bedtime.     [provider]  magnesium oxide (MAG-OX) 400 (241.3 Mg) MG tablet Take 1 tablet (400 mg total) by mouth daily. 04/27/16   Loletha Grayer, MD  megestrol (MEGACE) 40 MG tablet Take 1 tablet (40 mg total) by  mouth daily. 08/02/16   Gladstone Lighter, MD  metoCLOPramide (REGLAN) 5 MG tablet Take 1 tablet (5 mg total) by mouth 2 (two) times daily. 06/02/16   Lorella Nimrod, MD  Multiple Vitamin (MULTIVITAMIN WITH MINERALS) TABS tablet Take 1 tablet by mouth daily. 06/02/16   Shela Leff, MD  ondansetron (ZOFRAN-ODT) 4 MG disintegrating tablet Take 4 mg by mouth every 6 (six) hours as needed for nausea or vomiting.    [provider]  oxyCODONE-acetaminophen (PERCOCET) 7.5-325 MG tablet Take 1 tablet by mouth 2 (two) times daily as needed for severe pain. 09/19/16   Dustin Flock, MD  pantoprazole (PROTONIX) 40 MG tablet Take 1 tablet (40 mg total) by mouth 2 (two) times daily. 04/27/16   Loletha Grayer, MD  polyethylene glycol [facility]) packet Take 17 g by mouth daily. 08/02/16   Gladstone Lighter, MD  sertraline (ZOLOFT) 25 MG tablet Take 1 tablet (25 mg total) by mouth daily. 06/02/16   Lorella Nimrod, MD  sucralfate (CARAFATE) 1 GM/10ML suspension Take 10 mLs (1 g total) by mouth 4 (four) times daily -  with meals and at bedtime. 06/02/16   Lorella Nimrod, MD  tamsulosin (FLOMAX) 0.4 MG CAPS capsule Take 1 capsule (0.4 mg total) by mouth daily. Patient taking differently: Take 0.4 mg by mouth daily after lunch.  03/14/16   Fritzi Mandes, MD    Allergies Patient has no known allergies.  Family History  Problem Relation Age of Onset  . Diabetes Mother   . Cancer Mother   . Cancer Father   . Bladder Cancer Neg Hx   . Kidney cancer Neg Hx     Social History Social History  Substance Use Topics  . Smoking status: Never Smoker  . Smokeless tobacco: Never Used  . Alcohol use No    Review of Systems  Constitutional: No fever/chills Eyes: No visual changes. ENT: No sore throat. Cardiovascular: Denies chest pain. Respiratory: Denies shortness of breath. Gastrointestinal: No abdominal pain.  No nausea, no vomiting.  No diarrhea.  No constipation. Genitourinary: Negative for  dysuria. Musculoskeletal: Negative for back pain. Skin: Negative for rash. Neurological: Negative for headaches, focal weakness or numbness.   ____________________________________________   PHYSICAL EXAM:  VITAL SIGNS: ED Triage Vitals  Enc Vitals Group     BP 10/04/16 1444 (!) 174/64     Pulse Rate 10/04/16 1444 72     Resp 10/04/16 1444 16     Temp 10/04/16 1444 98.1 F (36.7 C)     Temp Source 10/04/16 1444 Oral     SpO2 10/04/16 1444 97 %     Weight 10/04/16 1445 168 lb (76.2 kg)     Height 10/04/16 1445 5\' 4"  (1.626 m)     Head Circumference --      Peak Flow --      Pain Score 10/04/16 1442 0     Pain Loc --      Pain Edu? --      Excl. in [place]? --  Constitutional: Alert and oriented. Well appearing and in no acute distress. Eyes: Conjunctivae are normal.  Head: Atraumatic. Nose: No congestion/rhinnorhea. Mouth/Throat: Mucous membranes are moist.  Neck: No stridor.   Cardiovascular: Normal rate, regular rhythm. Grossly normal heart sounds.  Good peripheral circulation left radial pulse present. Respiratory: Normal respiratory effort.  No retractions. Lungs CTAB. Gastrointestinal: Soft and nontender. No distention. grossly brown stool with heme negative Hemoccult testing. Musculoskeletal: No lower extremity tenderness nor edema.  No joint effusions.  left upper extremity which is moderately swollen with edema from just distal to the elbow down to the fingers. There is mild warmth as well as erythema especially to over the volar surface. No breaks in the skin. No pus. Mild tenderness to palpation is present. No bony deformity.  Neurologic:  Normal speech and language. No gross focal neurologic deficits are appreciated. Skin:  Skin is warm, dry and intact. No rash noted. Psychiatric: Mood and affect are normal. Speech and behavior are normal.  ____________________________________________   LABS (all labs ordered are listed, but only abnormal results are  displayed)  Labs Reviewed  CBC WITH DIFFERENTIAL/PLATELET  COMPREHENSIVE METABOLIC PANEL  PROTIME-INR  APTT  TYPE AND SCREEN   ____________________________________________  EKG   ____________________________________________  RADIOLOGY no DVT to the left upper extremity  ____________________________________________   PROCEDURES  Procedure(s) performed:   Procedures  Critical Care performed:   ____________________________________________   INITIAL IMPRESSION / ASSESSMENT AND PLAN / ED COURSE  Pertinent labs & imaging results that were available during my care of the patient were reviewed by me and considered in my medical decision making (see chart for details).  ----------------------------------------- 7:06 PM on 10/04/2016 -----------------------------------------  Patient resting at this time without any distress. Denies any complaints at this time. No nausea. Is not any episodes of vomiting in the emergency department. Possible cellulitis to left upper extremity. She'll be discharged with doxycycline. Also will discharge with several doses of potassium and will be given a dose of potassium prior to discharge from the hospital. She is understanding of the plan and willing to comply.satisfactory hemoglobin which appears to be her baseline.      ____________________________________________   FINAL CLINICAL IMPRESSION(S) / ED DIAGNOSES  cellulitis. Hypokalemia.    NEW MEDICATIONS STARTED DURING THIS VISIT:  New Prescriptions   No medications on file     Note:  This document was prepared using Dragon voice recognition software and may include unintentional dictation errors.     Orbie Pyo, MD 10/04/16 1906  patient having difficulty swallowing potassium pills. Was able to get 20 mEq down after taking several sips of soda after she got a pill "get stuck." We'll give the other 20 mEq with liquid solution also discharge her with liquid  solution.    Orbie Pyo, MD 10/04/16 [phone]

## 2016-10-04 NOTE — ED Notes (Signed)
Patient transported to Ultrasound 

## 2016-10-05 ENCOUNTER — Emergency Department (HOSPITAL_COMMUNITY): Payer: Medicare Other

## 2016-10-05 ENCOUNTER — Inpatient Hospital Stay (HOSPITAL_COMMUNITY): Payer: Medicare Other

## 2016-10-05 ENCOUNTER — Inpatient Hospital Stay (HOSPITAL_COMMUNITY)
Admission: EM | Admit: 2016-10-05 | Discharge: 2016-11-03 | DRG: 193 | Disposition: A | Discharge status: Skilled Nursing Facility | Payer: Medicare Other | Attending: Family Medicine | Admitting: Family Medicine

## 2016-10-05 ENCOUNTER — Encounter (HOSPITAL_COMMUNITY): Payer: Self-pay | Admitting: Emergency Medicine

## 2016-10-05 DIAGNOSIS — B964 Proteus (mirabilis) (morganii) as the cause of diseases classified elsewhere: Secondary | ICD-10-CM | POA: Diagnosis present

## 2016-10-05 DIAGNOSIS — I10 Essential (primary) hypertension: Secondary | ICD-10-CM | POA: Diagnosis not present

## 2016-10-05 DIAGNOSIS — Z79899 Other long term (current) drug therapy: Secondary | ICD-10-CM

## 2016-10-05 DIAGNOSIS — Y95 Nosocomial condition: Secondary | ICD-10-CM | POA: Diagnosis present

## 2016-10-05 DIAGNOSIS — Z794 Long term (current) use of insulin: Secondary | ICD-10-CM

## 2016-10-05 DIAGNOSIS — Z681 Body mass index (BMI) 19 or less, adult: Secondary | ICD-10-CM

## 2016-10-05 DIAGNOSIS — E1122 Type 2 diabetes mellitus with diabetic chronic kidney disease: Secondary | ICD-10-CM | POA: Diagnosis present

## 2016-10-05 DIAGNOSIS — Z86711 Personal history of pulmonary embolism: Secondary | ICD-10-CM

## 2016-10-05 DIAGNOSIS — I2609 Other pulmonary embolism with acute cor pulmonale: Secondary | ICD-10-CM | POA: Diagnosis not present

## 2016-10-05 DIAGNOSIS — N183 Chronic kidney disease, stage 3 unspecified: Secondary | ICD-10-CM | POA: Diagnosis present

## 2016-10-05 DIAGNOSIS — E11649 Type 2 diabetes mellitus with hypoglycemia without coma: Secondary | ICD-10-CM | POA: Diagnosis not present

## 2016-10-05 DIAGNOSIS — N2889 Other specified disorders of kidney and ureter: Secondary | ICD-10-CM | POA: Diagnosis present

## 2016-10-05 DIAGNOSIS — E860 Dehydration: Secondary | ICD-10-CM | POA: Diagnosis present

## 2016-10-05 DIAGNOSIS — R109 Unspecified abdominal pain: Secondary | ICD-10-CM

## 2016-10-05 DIAGNOSIS — I447 Left bundle-branch block, unspecified: Secondary | ICD-10-CM | POA: Diagnosis present

## 2016-10-05 DIAGNOSIS — J181 Lobar pneumonia, unspecified organism: Secondary | ICD-10-CM | POA: Diagnosis not present

## 2016-10-05 DIAGNOSIS — R112 Nausea with vomiting, unspecified: Secondary | ICD-10-CM | POA: Diagnosis not present

## 2016-10-05 DIAGNOSIS — K219 Gastro-esophageal reflux disease without esophagitis: Secondary | ICD-10-CM | POA: Diagnosis present

## 2016-10-05 DIAGNOSIS — E1165 Type 2 diabetes mellitus with hyperglycemia: Secondary | ICD-10-CM | POA: Diagnosis present

## 2016-10-05 DIAGNOSIS — E43 Unspecified severe protein-calorie malnutrition: Secondary | ICD-10-CM | POA: Diagnosis present

## 2016-10-05 DIAGNOSIS — Z7901 Long term (current) use of anticoagulants: Secondary | ICD-10-CM

## 2016-10-05 DIAGNOSIS — J189 Pneumonia, unspecified organism: Secondary | ICD-10-CM | POA: Diagnosis present

## 2016-10-05 DIAGNOSIS — R410 Disorientation, unspecified: Secondary | ICD-10-CM | POA: Diagnosis present

## 2016-10-05 DIAGNOSIS — E872 Acidosis: Secondary | ICD-10-CM | POA: Diagnosis present

## 2016-10-05 DIAGNOSIS — G43A0 Cyclical vomiting, not intractable: Secondary | ICD-10-CM | POA: Diagnosis not present

## 2016-10-05 DIAGNOSIS — I129 Hypertensive chronic kidney disease with stage 1 through stage 4 chronic kidney disease, or unspecified chronic kidney disease: Secondary | ICD-10-CM | POA: Diagnosis present

## 2016-10-05 DIAGNOSIS — Z9111 Patient's noncompliance with dietary regimen: Secondary | ICD-10-CM

## 2016-10-05 DIAGNOSIS — N390 Urinary tract infection, site not specified: Secondary | ICD-10-CM | POA: Diagnosis present

## 2016-10-05 DIAGNOSIS — G9341 Metabolic encephalopathy: Secondary | ICD-10-CM | POA: Diagnosis present

## 2016-10-05 DIAGNOSIS — Z66 Do not resuscitate: Secondary | ICD-10-CM | POA: Diagnosis present

## 2016-10-05 DIAGNOSIS — R627 Adult failure to thrive: Secondary | ICD-10-CM | POA: Diagnosis present

## 2016-10-05 DIAGNOSIS — F0150 Vascular dementia without behavioral disturbance: Secondary | ICD-10-CM | POA: Diagnosis present

## 2016-10-05 DIAGNOSIS — Z515 Encounter for palliative care: Secondary | ICD-10-CM | POA: Diagnosis not present

## 2016-10-05 DIAGNOSIS — E109 Type 1 diabetes mellitus without complications: Secondary | ICD-10-CM | POA: Diagnosis not present

## 2016-10-05 DIAGNOSIS — B952 Enterococcus as the cause of diseases classified elsewhere: Secondary | ICD-10-CM | POA: Diagnosis present

## 2016-10-05 DIAGNOSIS — F419 Anxiety disorder, unspecified: Secondary | ICD-10-CM | POA: Diagnosis present

## 2016-10-05 DIAGNOSIS — I480 Paroxysmal atrial fibrillation: Secondary | ICD-10-CM | POA: Diagnosis present

## 2016-10-05 DIAGNOSIS — E785 Hyperlipidemia, unspecified: Secondary | ICD-10-CM | POA: Diagnosis present

## 2016-10-05 DIAGNOSIS — E876 Hypokalemia: Secondary | ICD-10-CM | POA: Diagnosis present

## 2016-10-05 DIAGNOSIS — R4182 Altered mental status, unspecified: Secondary | ICD-10-CM | POA: Diagnosis not present

## 2016-10-05 DIAGNOSIS — R0600 Dyspnea, unspecified: Secondary | ICD-10-CM

## 2016-10-05 DIAGNOSIS — Z9114 Patient's other noncompliance with medication regimen: Secondary | ICD-10-CM

## 2016-10-05 DIAGNOSIS — I482 Chronic atrial fibrillation: Secondary | ICD-10-CM | POA: Diagnosis not present

## 2016-10-05 DIAGNOSIS — E870 Hyperosmolality and hypernatremia: Secondary | ICD-10-CM | POA: Diagnosis not present

## 2016-10-05 DIAGNOSIS — F0391 Unspecified dementia with behavioral disturbance: Secondary | ICD-10-CM | POA: Diagnosis not present

## 2016-10-05 LAB — COMPREHENSIVE METABOLIC PANEL
ALBUMIN: 3 g/dL — AB (ref 3.5–5.0)
ALT: 11 U/L — ABNORMAL LOW (ref 14–54)
ANION GAP: 23 — AB (ref 5–15)
AST: 17 U/L (ref 15–41)
Alkaline Phosphatase: 73 U/L (ref 38–126)
BILIRUBIN TOTAL: 1.3 mg/dL — AB (ref 0.3–1.2)
BUN: 11 mg/dL (ref 6–20)
CO2: 12 mmol/L — ABNORMAL LOW (ref 22–32)
Calcium: 8.8 mg/dL — ABNORMAL LOW (ref 8.9–10.3)
Chloride: 105 mmol/L (ref 101–111)
Creatinine, Ser: 1.38 mg/dL — ABNORMAL HIGH (ref 0.44–1.00)
GFR, EST AFRICAN AMERICAN: 39 mL/min — AB (ref >60)
GFR, EST NON AFRICAN AMERICAN: 33 mL/min — AB (ref >60)
Glucose, Bld: 294 mg/dL — ABNORMAL HIGH (ref 65–99)
POTASSIUM: 3.3 mmol/L — AB (ref 3.5–5.1)
SODIUM: 140 mmol/L (ref 135–145)
TOTAL PROTEIN: 6.5 g/dL (ref 6.5–8.1)

## 2016-10-05 LAB — I-STAT CG4 LACTIC ACID, ED: LACTIC ACID, VENOUS: 2.2 mmol/L — AB (ref 0.5–1.9)

## 2016-10-05 LAB — CBC WITH DIFFERENTIAL/PLATELET
BASOS PCT: 0 %
Basophils Absolute: 0 10*3/uL (ref 0.0–0.1)
EOS ABS: 0 10*3/uL (ref 0.0–0.7)
Eosinophils Relative: 0 %
HCT: 34.3 % — ABNORMAL LOW (ref 36.0–46.0)
HEMOGLOBIN: 10.9 g/dL — AB (ref 12.0–15.0)
Lymphocytes Relative: 20 %
Lymphs Abs: 0.7 10*3/uL (ref 0.7–4.0)
MCH: 27 pg (ref 26.0–34.0)
MCHC: 31.8 g/dL (ref 30.0–36.0)
MCV: 84.9 fL (ref 78.0–100.0)
MONOS PCT: 1 %
Monocytes Absolute: 0 10*3/uL — ABNORMAL LOW (ref 0.1–1.0)
NEUTROS PCT: 79 %
Neutro Abs: 2.8 10*3/uL (ref 1.7–7.7)
Platelets: 394 10*3/uL (ref 150–400)
RBC: 4.04 MIL/uL (ref 3.87–5.11)
RDW: 15 % (ref 11.5–15.5)
WBC: 3.6 10*3/uL — AB (ref 4.0–10.5)

## 2016-10-05 LAB — URINALYSIS, MICROSCOPIC (REFLEX)

## 2016-10-05 LAB — URINALYSIS, ROUTINE W REFLEX MICROSCOPIC
Bilirubin Urine: NEGATIVE
GLUCOSE, UA: 100 mg/dL — AB
Nitrite: NEGATIVE
PROTEIN: 100 mg/dL — AB
Specific Gravity, Urine: 1.02 (ref 1.005–1.030)
pH: 6 (ref 5.0–8.0)

## 2016-10-05 LAB — GLUCOSE, CAPILLARY: GLUCOSE-CAPILLARY: 230 mg/dL — AB (ref 65–99)

## 2016-10-05 LAB — PROTIME-INR
INR: 0.98
PROTHROMBIN TIME: 12.9 s (ref 11.4–15.2)

## 2016-10-05 LAB — CBG MONITORING, ED: Glucose-Capillary: 269 mg/dL — ABNORMAL HIGH (ref 65–99)

## 2016-10-05 MED ORDER — APIXABAN 2.5 MG PO TABS
2.5000 mg | ORAL_TABLET | Freq: Two times a day (BID) | ORAL | Status: DC
  Administered 2016-10-09 – 2016-10-30 (×10): 2.5 mg via ORAL
  Filled 2016-10-05 (×32): qty 1

## 2016-10-05 MED ORDER — INSULIN ASPART 100 UNIT/ML ~~LOC~~ SOLN
0.0000 [IU] | Freq: Three times a day (TID) | SUBCUTANEOUS | Status: DC
  Administered 2016-10-05: 5 [IU] via SUBCUTANEOUS
  Administered 2016-10-06: 1 [IU] via SUBCUTANEOUS
  Administered 2016-10-06 (×2): 5 [IU] via SUBCUTANEOUS
  Administered 2016-10-07 (×2): 3 [IU] via SUBCUTANEOUS
  Administered 2016-10-08: 2 [IU] via SUBCUTANEOUS
  Administered 2016-10-08: 3 [IU] via SUBCUTANEOUS
  Administered 2016-10-09: 1 [IU] via SUBCUTANEOUS
  Administered 2016-10-09 – 2016-10-11 (×5): 2 [IU] via SUBCUTANEOUS
  Administered 2016-10-11 – 2016-10-12 (×2): 3 [IU] via SUBCUTANEOUS
  Administered 2016-10-12: 2 [IU] via SUBCUTANEOUS
  Administered 2016-10-12: 1 [IU] via SUBCUTANEOUS
  Administered 2016-10-13: 3 [IU] via SUBCUTANEOUS
  Filled 2016-10-05: qty 1

## 2016-10-05 MED ORDER — ONDANSETRON HCL 4 MG PO TABS
4.0000 mg | ORAL_TABLET | Freq: Four times a day (QID) | ORAL | Status: DC | PRN
  Administered 2016-10-19: 4 mg via ORAL
  Filled 2016-10-05: qty 1

## 2016-10-05 MED ORDER — DEXTROSE 5 % IV SOLN: 1.0000 g | Freq: Three times a day (TID) | INTRAVENOUS | Status: DC

## 2016-10-05 MED ORDER — METOPROLOL TARTRATE 5 MG/5ML IV SOLN
2.5000 mg | Freq: Four times a day (QID) | INTRAVENOUS | Status: DC | PRN
  Administered 2016-10-05: 2.5 mg via INTRAVENOUS
  Filled 2016-10-05 (×2): qty 5

## 2016-10-05 MED ORDER — SODIUM CHLORIDE 0.9 % IV SOLN
INTRAVENOUS | Status: DC
  Administered 2016-10-05 – 2016-10-06 (×3): via INTRAVENOUS

## 2016-10-05 MED ORDER — ACETAMINOPHEN 325 MG PO TABS: 650.0000 mg | ORAL_TABLET | Freq: Four times a day (QID) | ORAL | Status: DC | PRN

## 2016-10-05 MED ORDER — VANCOMYCIN HCL IN DEXTROSE 750-5 MG/150ML-% IV SOLN
750.0000 mg | INTRAVENOUS | Status: DC
  Filled 2016-10-05: qty 150

## 2016-10-05 MED ORDER — HYDRALAZINE HCL 20 MG/ML IJ SOLN
5.0000 mg | Freq: Four times a day (QID) | INTRAMUSCULAR | Status: DC | PRN
  Administered 2016-10-05 – 2016-10-06 (×3): 5 mg via INTRAVENOUS
  Filled 2016-10-05 (×3): qty 1

## 2016-10-05 MED ORDER — SODIUM CHLORIDE 0.9 % IV BOLUS (SEPSIS)
1000.0000 mL | Freq: Once | INTRAVENOUS | Status: AC
  Administered 2016-10-05: 1000 mL via INTRAVENOUS

## 2016-10-05 MED ORDER — TAMSULOSIN HCL 0.4 MG PO CAPS
0.4000 mg | ORAL_CAPSULE | Freq: Every day | ORAL | Status: DC
  Administered 2016-10-10 – 2016-10-19 (×2): 0.4 mg via ORAL
  Filled 2016-10-05 (×11): qty 1

## 2016-10-05 MED ORDER — SUCRALFATE 1 GM/10ML PO SUSP
1.0000 g | Freq: Three times a day (TID) | ORAL | Status: DC
  Administered 2016-10-10: 1 g via ORAL
  Filled 2016-10-05 (×8): qty 10

## 2016-10-05 MED ORDER — OXYCODONE-ACETAMINOPHEN 7.5-325 MG PO TABS
1.0000 | ORAL_TABLET | Freq: Two times a day (BID) | ORAL | Status: DC | PRN
  Administered 2016-10-05: 1 via ORAL
  Filled 2016-10-05: qty 1

## 2016-10-05 MED ORDER — DOCUSATE SODIUM 100 MG PO CAPS
100.0000 mg | ORAL_CAPSULE | Freq: Two times a day (BID) | ORAL | Status: DC
  Administered 2016-10-10 – 2016-10-13 (×3): 100 mg via ORAL
  Filled 2016-10-05 (×16): qty 1

## 2016-10-05 MED ORDER — DEXTROSE 5 % IV SOLN
1.0000 g | INTRAVENOUS | Status: DC
  Administered 2016-10-06: 1 g via INTRAVENOUS
  Filled 2016-10-05 (×3): qty 1

## 2016-10-05 MED ORDER — VANCOMYCIN HCL 10 G IV SOLR
1500.0000 mg | Freq: Once | INTRAVENOUS | Status: AC
  Administered 2016-10-05: 1500 mg via INTRAVENOUS
  Filled 2016-10-05: qty 1500

## 2016-10-05 MED ORDER — GABAPENTIN 300 MG PO CAPS
300.0000 mg | ORAL_CAPSULE | Freq: Every day | ORAL | Status: DC
  Administered 2016-10-10 – 2016-10-11 (×2): 300 mg via ORAL
  Filled 2016-10-05 (×7): qty 1

## 2016-10-05 MED ORDER — ACETAMINOPHEN 650 MG RE SUPP: 650.0000 mg | Freq: Four times a day (QID) | RECTAL | Status: DC | PRN

## 2016-10-05 MED ORDER — DEXTROSE 5 % IV SOLN
1.0000 g | Freq: Three times a day (TID) | INTRAVENOUS | Status: DC
  Administered 2016-10-05: 1 g via INTRAVENOUS
  Filled 2016-10-05 (×3): qty 1

## 2016-10-05 MED ORDER — ATORVASTATIN CALCIUM 20 MG PO TABS
20.0000 mg | ORAL_TABLET | Freq: Every day | ORAL | Status: DC
  Administered 2016-10-10: 20 mg via ORAL
  Filled 2016-10-05 (×5): qty 1

## 2016-10-05 MED ORDER — ONDANSETRON HCL 4 MG/2ML IJ SOLN
4.0000 mg | Freq: Four times a day (QID) | INTRAMUSCULAR | Status: DC | PRN
  Administered 2016-10-05 – 2016-10-06 (×2): 4 mg via INTRAVENOUS
  Filled 2016-10-05 (×3): qty 2

## 2016-10-05 MED ORDER — POTASSIUM CHLORIDE 20 MEQ/15ML (10%) PO SOLN
40.0000 meq | Freq: Two times a day (BID) | ORAL | Status: DC
  Administered 2016-10-10: 40 meq via ORAL
  Filled 2016-10-05 (×7): qty 30

## 2016-10-05 MED ORDER — PANTOPRAZOLE SODIUM 40 MG PO TBEC
40.0000 mg | DELAYED_RELEASE_TABLET | Freq: Two times a day (BID) | ORAL | Status: DC
  Administered 2016-10-09 – 2016-10-14 (×7): 40 mg via ORAL
  Filled 2016-10-05 (×19): qty 1

## 2016-10-05 MED ORDER — AMIODARONE HCL 200 MG PO TABS
200.0000 mg | ORAL_TABLET | Freq: Every day | ORAL | Status: DC
  Administered 2016-10-10 – 2016-10-24 (×3): 200 mg via ORAL
  Filled 2016-10-05 (×11): qty 1

## 2016-10-05 NOTE — ED Notes (Signed)
Got patient on the monitor did ekg shown to Dr Lockwood patient is resting with call bell in reach 

## 2016-10-05 NOTE — Progress Notes (Addendum)
Pharmacy Antibiotic Note  Sherry [name] is a 83 y.o. female admitted on 10/05/2016 with AMS and N/V. Starting empiric antibiotics for possible pneumonia. SCr 1.38, eCrCl 25-30 ml/min, LA 2.2. Received one dose of cefepime while in ED.   Plan: -Vancomycin 1500 mg IV x1 then 750 mg IV q24h -Monitor renal fx, cultures, s/sx bleeding -Cefepime 1 g IV q24h    Temp (24hrs), Avg:98.2 F (36.8 C), Min:98.2 F (36.8 C), Max:98.2 F (36.8 C)   Recent Labs Lab 10/04/16 1659 10/05/16 1535 10/05/16 1548  WBC 5.1 3.6*  --   CREATININE 1.12* 1.38*  --   LATICACIDVEN  --   --  2.20*    Estimated Creatinine Clearance: 28.7 mL/min (A) (by C-G formula based on SCr of 1.38 mg/dL (H)).    No Known Allergies  Antimicrobials this admission: 9/27 cefepime > 9/27 vancomycin >  Dose adjustments this admission: N/A  Microbiology results: 9/27 blood cx:  Harvel Quale 10/05/2016 5:03 PM

## 2016-10-05 NOTE — ED Notes (Signed)
Dr. Starla Link at bedside to see patient.

## 2016-10-05 NOTE — ED Notes (Signed)
Unable to collect 2nd lactic.

## 2016-10-05 NOTE — H&P (Signed)
History and Physical    Sherry [name] ION:[id number] DOB: 1927/03/30 DOA: 10/05/2016  PCP: [name], MD   Patient coming from: SNF I have personally briefly reviewed patient's old medical records in [place]  Chief Complaint: Altered mental status  HPI: Sherry [name] is a 83 y.o. female with medical history significant of hypertension, diabetes, chronic kidney disease, pulmonary embolism on oral anticoagulation, hyperlipidemia, questionable dementia and recent admission and discharge on 09/19/2016 at [facility] for acute encephalopathy due to severe hypoglycemia and hematemesis with possible upper GI bleed was sent from nursing home for altered mental status. Patient is a poor historian so reliability of the history is poor. Patient is unsure why she is in the hospital although she states that she is having abdominal pain and vomiting. She does not provide any other history. As per the ER provider Vanita Ingles dentition, patient was apparently evaluated in emergency department at the facility yesterday due to ongoing nausea and vomiting but was then draped return back to her nursing home after evaluation for cellulitis.  ED Course:  In the emergency room patient was given IV antibiotics for probable pneumonia and hospitalist service was called to evaluate the patient.  Review of Systems: Unable to be obtained because of patient's mental status and confusion  Past Medical History:  Diagnosis Date  . Anxiety   . Arthritis   . Chronic kidney disease   . Diabetes mellitus without complication ([place])   . Dizziness   . Dyspnea   . GERD (gastroesophageal reflux disease)   . Hyperlipidemia   . Hypertension   . Pulmonary embolism ([place]) 2016    Past Surgical History:  Procedure Laterality Date  . APPENDECTOMY    . BACK SURGERY    . ESOPHAGOGASTRODUODENOSCOPY N/A 05/29/2016   Procedure: ESOPHAGOGASTRODUODENOSCOPY (EGD);  Surgeon: Clarene Essex, MD;  Location: [facility];  Service: Gastroenterology;  Laterality: N/A;  . SHOULDER SURGERY Left    Social history  reports that she has never smoked. She has never used smokeless tobacco. She reports that she does not drink alcohol or use drugs.  She is currently no nursing home  No Known Allergies  Family History  Problem Relation Age of Onset  . Diabetes Mother   . Cancer Mother   . Cancer Father   . Bladder Cancer Neg Hx   . Kidney cancer Neg Hx     Prior to Admission medications   Medication Sig Start Date End Date Taking? Authorizing Provider  amiodarone (PACERONE) 200 MG tablet Take 1 tablet (200 mg total) by mouth daily. 09/08/16  Yes Vaughan Basta, MD  apixaban (ELIQUIS) 2.5 MG TABS tablet Take 1 tablet (2.5 mg total) by mouth 2 (two) times daily. 07/23/16  Yes Gladstone Lighter, MD  atorvastatin (LIPITOR) 20 MG tablet Take 20 mg by mouth at bedtime.    Yes [provider]  docusate sodium (COLACE) 100 MG capsule Take 1 capsule (100 mg total) by mouth 2 (two) times daily. 08/02/16  Yes Gladstone Lighter, MD  doxycycline (VIBRAMYCIN) 100 MG capsule Take 1 capsule (100 mg total) by mouth 2 (two) times daily. 10/04/16  Yes Orbie Pyo, MD  ferrous sulfate 325 (65 FE) MG tablet Take 1 tablet (325 mg total) by mouth daily with breakfast. 06/03/16  Yes Lorella Nimrod, MD  gabapentin (NEURONTIN) 300 MG capsule Take 300 mg by mouth at bedtime.    Yes [provider]  magnesium oxide (MAG-OX) 400 (241.3 Mg) MG tablet  Take 1 tablet (400 mg total) by mouth daily. 04/27/16  Yes Wieting, Richard, MD  megestrol (MEGACE) 40 MG tablet Take 1 tablet (40 mg total) by mouth daily. 08/02/16  Yes Gladstone Lighter, MD  metoCLOPramide (REGLAN) 5 MG tablet Take 1 tablet (5 mg total) by mouth 2 (two) times daily. 06/02/16  Yes Lorella Nimrod, MD  Multiple Vitamin (MULTIVITAMIN WITH MINERALS) TABS tablet Take 1 tablet by mouth daily. 06/02/16  Yes Shela Leff, MD  ondansetron  (ZOFRAN-ODT) 4 MG disintegrating tablet Take 4 mg by mouth every 6 (six) hours as needed for nausea or vomiting.   Yes [provider]  oxyCODONE-acetaminophen (PERCOCET) 7.5-325 MG tablet Take 1 tablet by mouth 2 (two) times daily as needed for severe pain. 09/19/16  Yes Dustin Flock, MD  pantoprazole (PROTONIX) 40 MG tablet Take 1 tablet (40 mg total) by mouth 2 (two) times daily. 04/27/16  Yes Wieting, Richard, MD  polyethylene glycol [facility]) packet Take 17 g by mouth daily. 08/02/16  Yes Gladstone Lighter, MD  Potassium Chloride 40 MEQ/15ML (20%) SOLN Take 40 mEq by mouth 2 (two) times daily. 10/04/16  Yes Schaevitz, Randall An, MD  Potassium Chloride 40 MEQ/15ML (20%) SOLN Take 15 mLs by mouth 2 (two) times daily.   Yes [provider]  promethazine (PHENERGAN) 25 MG tablet Take 25 mg by mouth every 6 (six) hours as needed for nausea or vomiting.   Yes [provider]  sertraline (ZOLOFT) 25 MG tablet Take 1 tablet (25 mg total) by mouth daily. 06/02/16  Yes Lorella Nimrod, MD  sucralfate (CARAFATE) 1 GM/10ML suspension Take 10 mLs (1 g total) by mouth 4 (four) times daily -  with meals and at bedtime. 06/02/16  Yes Lorella Nimrod, MD  tamsulosin (FLOMAX) 0.4 MG CAPS capsule Take 1 capsule (0.4 mg total) by mouth daily. Patient taking differently: Take 0.4 mg by mouth daily after breakfast.  03/14/16  Yes Fritzi Mandes, MD  potassium chloride (K-DUR) 10 MEQ tablet Take 4 tablets (40 mEq total) by mouth 2 (two) times daily. 10/04/16   Orbie Pyo, MD    Physical Exam: Vitals:   10/05/16 1645 10/05/16 1700 10/05/16 1715 10/05/16 1730  BP: (!) 173/74 (!) 169/83 (!) 190/74 (!) 192/75  Pulse: 89 (!) 111 98 95  Resp: 17 (!) 22 (!) 21 19  Temp:      TempSrc:      SpO2: 99% 100% 99% 99%    Constitutional: Elderly female, very frail looking, in mild distress probably secondary to nausea Vitals:   10/05/16 1645 10/05/16 1700 10/05/16 1715 10/05/16 1730  BP:  (!) 173/74 (!) 169/83 (!) 190/74 (!) 192/75  Pulse: 89 (!) 111 98 95  Resp: 17 (!) 22 (!) 21 19  Temp:      TempSrc:      SpO2: 99% 100% 99% 99%   Eyes: PERRL, lids and conjunctivae normal ENMT: Mucous membranes are Dry. Posterior pharynx clear of any exudate or lesions. Neck: normal, supple, no masses, no thyromegaly Respiratory: Bilateral decreased breath sounds at bases with some scattered crackles. No accessory muscle use  Cardiovascular: S1 and S2 positive, rate controlled  Abdomen: Mild epigastric tenderness, no masses palpated. No hepatosplenomegaly. Bowel sounds positive. No rebound tenderness Musculoskeletal: no clubbing / cyanosis. Normal muscle tone.  Skin: no rashes, lesions, ulcers. No induration Neurologic: CN 2-12 grossly intact. Sensation intact,  Strength 4+/5 in all 4. Patient is very sleepy, wakes up and answers some questions but is slightly confused  and is a poor historian. Lymph: No cervical lymphadenopathy.    Labs on Admission: I have personally reviewed following labs and imaging studies  CBC:  Recent Labs Lab 10/04/16 1659 10/05/16 1535  WBC 5.1 3.6*  NEUTROABS 2.8 2.8  HGB 9.9* 10.9*  HCT 29.9* 34.3*  MCV 84.1 84.9  PLT 349 195   Basic Metabolic Panel:  Recent Labs Lab 10/04/16 1659 10/05/16 1535  NA 144 140  K 2.7* 3.3*  CL 109 105  CO2 21* 12*  GLUCOSE 139* 294*  BUN 15 11  CREATININE 1.12* 1.38*  CALCIUM 8.4* 8.8*   GFR: Estimated Creatinine Clearance: 28.7 mL/min (A) (by C-G formula based on SCr of 1.38 mg/dL (H)). Liver Function Tests:  Recent Labs Lab 10/04/16 1659 10/05/16 1535  AST 13* 17  ALT 10* 11*  ALKPHOS 58 73  BILITOT 0.9 1.3*  PROT 5.4* 6.5  ALBUMIN 2.4* 3.0*   No results for input(s): LIPASE, AMYLASE in the last 168 hours. No results for input(s): AMMONIA in the last 168 hours. Coagulation Profile:  Recent Labs Lab 10/04/16 1659 10/05/16 1535  INR 0.99 0.98   Cardiac Enzymes: No results for  input(s): CKTOTAL, CKMB, CKMBINDEX, TROPONINI in the last 168 hours. BNP (last 3 results) No results for input(s): PROBNP in the last 8760 hours. HbA1C: No results for input(s): HGBA1C in the last 72 hours. CBG:  Recent Labs Lab 10/05/16 1536  GLUCAP 269*   Lipid Profile: No results for input(s): CHOL, HDL, LDLCALC, TRIG, CHOLHDL, LDLDIRECT in the last 72 hours. Thyroid Function Tests: No results for input(s): TSH, T4TOTAL, FREET4, T3FREE, THYROIDAB in the last 72 hours. Anemia Panel: No results for input(s): VITAMINB12, FOLATE, FERRITIN, TIBC, IRON, RETICCTPCT in the last 72 hours. Urine analysis:    Component Value Date/Time   COLORURINE YELLOW (A) 09/13/2016 2307   APPEARANCEUR CLEAR (A) 09/13/2016 2307   APPEARANCEUR Clear 10/21/2012 1333   LABSPEC 1.012 09/13/2016 2307   LABSPEC 1.006 10/21/2012 1333   PHURINE 5.0 09/13/2016 2307   GLUCOSEU 50 (A) 09/13/2016 2307   GLUCOSEU 50 mg/dL 10/21/2012 1333   HGBUR MODERATE (A) 09/13/2016 2307   BILIRUBINUR NEGATIVE 09/13/2016 2307   BILIRUBINUR Negative 10/21/2012 [place] 09/13/2016 2307   PROTEINUR 30 (A) 09/13/2016 2307   NITRITE NEGATIVE 09/13/2016 2307   LEUKOCYTESUR NEGATIVE 09/13/2016 2307   LEUKOCYTESUR Negative 10/21/2012 1333    Radiological Exams on Admission: Ct Head Wo Contrast  Result Date: 10/05/2016 CLINICAL DATA:  Altered level of consciousness. EXAM: CT HEAD WITHOUT CONTRAST TECHNIQUE: Contiguous axial images were obtained from the base of the skull through the vertex without intravenous contrast. COMPARISON:  CT scan of September 05, 2016. FINDINGS: Brain: Mild diffuse cortical atrophy is noted. Mild chronic ischemic white matter disease is noted. No mass effect or midline shift is noted. Ventricular size is within normal limits. There is no evidence of mass lesion, hemorrhage or acute infarction. Vascular: No hyperdense vessel or unexpected calcification. Skull: Normal. Negative for fracture or  focal lesion. Sinuses/Orbits: No acute finding. Other: None. IMPRESSION: Mild diffuse cortical atrophy. Mild chronic ischemic white matter disease. No acute intracranial abnormality seen. Electronically Signed   By: Marijo Conception, M.D.   On: 10/05/2016 17:10   US Venous Img Upper Uni Left  Result Date: 10/04/2016 CLINICAL DATA:  Two week history of unexplained left upper extremity edema. EXAM: LEFT UPPER EXTREMITY VENOUS DOPPLER ULTRASOUND TECHNIQUE: Gray-scale sonography with graded compression, as well as color Doppler and  duplex ultrasound were performed to evaluate the upper extremity deep venous system from the level of the subclavian vein and including the jugular, axillary, basilic, radial, ulnar and upper cephalic vein. Spectral Doppler was utilized to evaluate flow at rest and with distal augmentation maneuvers. COMPARISON:  09/19/2016. FINDINGS: Contralateral Right Subclavian Vein: Respiratory phasicity is normal and symmetric with the symptomatic side. No evidence of thrombus. Left Internal Jugular Vein: No evidence of thrombus. Normal compressibility, respiratory phasicity and response to augmentation. Subclavian Vein: No evidence of thrombus. Normal compressibility, respiratory phasicity and response to augmentation. Axillary Vein: No evidence of thrombus. Normal compressibility, respiratory phasicity and response to augmentation. Cephalic Vein: Difficult to visualize due to the edema in the upper arm. No visible thrombus. Basilic Vein: No evidence of thrombus. Normal compressibility, respiratory phasicity and response to augmentation. Brachial Veins: No evidence of thrombus. Normal compressibility, respiratory phasicity and response to augmentation. Radial Veins: No evidence of thrombus. Normal compressibility, respiratory phasicity and response to augmentation. Ulnar Veins: No evidence of thrombus. Normal compressibility, respiratory phasicity and response to augmentation. Venous Reflux:  Not  evaluated. Other Findings:  Subcutaneous edema involving the upper arm. IMPRESSION: No evidence of DVT involving the left upper extremity. Electronically Signed   By: Evangeline Dakin M.D.   On: 10/04/2016 17:02   Dg Chest Port 1 View  Result Date: 10/05/2016 CLINICAL DATA:  Altered mental status EXAM: PORTABLE CHEST 1 VIEW COMPARISON:  09/15/2016 FINDINGS: Normal removal of right PICC line. Airspace opacity noted in the right infrahilar region. Possible small right effusion. Left lung is clear. Heart is normal size. No acute bony abnormality. IMPRESSION: Right infrahilar airspace opacity, atelectasis versus pneumonia. Small right effusion. Electronically Signed   By: Rolm Baptise M.D.   On: 10/05/2016 15:12    Assessment/Plan Principal Problem:   Pneumonia Active Problems:   Chronic kidney disease (CKD), stage III (moderate)   Essential hypertension   Nausea & vomiting   Probable healthcare associated pneumonia - With a concern for multidrug resistant gram-negative rods and MRSA - Continue cefepime and vancomycin. Follow cultures. Follow urine Legionella and streptococcal antigen - Oxygen supplementation if needed - SLP evaluation - Aspiration precautions  Altered mental status with probable acute metabolic encephalopathy - Patient's baseline mental status is unknown; patient might have dementia as well - We will monitor mental status - Fall precautions - CT head is negative for acute abnormality. If mental status does not improve with medical treatment, we will see with MRI of the brain  Nausea and vomiting - Questionable cause. Might be related to pneumonia. - Patient has a history of constipation so we will order an x-ray of the abdomen  Chronic kidney disease stage III - Monitor creatinine. Normal saline at 100 mL an hour  History of pulmonary embolism - Continue anticoagulation with Eliquis  Probable chronic atrial fibrillation - Continue anticoagulation. Continue  amiodarone  Hypertension - Blood pressure on the higher side. Monitor blood pressure. We will use IV antihypertensives when necessary for now  Diabetes mellitus type 2 - Accu-Cheks with insulin sliding scale coverage  Generalized deconditioning -PT eval. - Palliative care consult for goals of care  Hypokalemia - Replace potassium. Repeat a.m. labs     DVT prophylaxis: Eliquis Code Status: DO NOT RESUSCITATE  Family Communication: None at bedside  Disposition Plan: Depends on clinical outcome Consults called: None Admission status: Inpatient at MedSurg  Severity of Illness: The appropriate patient status for this patient is INPATIENT. Inpatient status is judged to be  reasonable and necessary in order to provide the required intensity of service to ensure the patient's safety. [name] presenting symptoms, physical exam findings, and initial radiographic and laboratory data in the context of their chronic comorbidities is felt to place them at high risk for further clinical deterioration. Furthermore, it is not anticipated that the patient will be medically stable for discharge from the hospital within 2 midnights of admission. The following factors support the patient status of inpatient.   " [name] presenting symptoms include altered mental status. " The worrisome physical exam findings include probable pneumonia. " The initial radiographic and laboratory data are worrisome because of probable pneumonia. " The chronic co-morbidities include chronic kidney disease, diabetes, hypertension, probable dementia.   * I certify that at the point of admission it is my clinical judgment that the patient will require inpatient hospital care spanning beyond 2 midnights from the point of admission due to high intensity of service, high risk for further deterioration and high frequency of surveillance required.Aline August MD [facility] Pager [phone]  If  7PM-7AM, please contact night-coverage [email or web address] Password [facility]  10/05/2016, 6:02 PM

## 2016-10-05 NOTE — ED Triage Notes (Signed)
Patient from [facility] with [facility] for altered mental status.  EMS reports patient was sent to [facility] ED for nausea and vomiting x7 days, was returned with diagnosis of cellulitis. Today new altered mental status, normally alert and oriented x4, today alert to self and location only and lethargic.  Patient in no apparent distress at this time.

## 2016-10-05 NOTE — ED Notes (Signed)
I stat lactic acid results reported to Dr. Vanita Panda by B. Yolanda Bonine, EMT

## 2016-10-05 NOTE — ED Provider Notes (Signed)
[place] [facility] DEPT Provider Note   CSN: [id number] Arrival date & time: 10/05/16  1429     History   Chief Complaint Chief Complaint  Patient presents with  . Altered Mental Status    HPI Sherry [name] is a 83 y.o. female.  HPI  Patient presents from nursing facility. The patient herself is unsure why she is here. EMS and nursing home report differ, with one report suggesting the patient has worsening cellulitis, the other port suggesting she needs to be evaluated to exclude stroke. The patient herself denies pain, states that she feels weak all  Over. She denies fever, nausea, vomiting, diarrhea, incontinence. She denies skin color changes.  Nursing home recods suggest the patient was recently evaluated in emergency department at another facility yesterday due to ongoing nausea, vomiting.Marland Kitchen  She was returned back to her nursing homeafter evaluation for cellulitis. She seemingly was hospitalized earlier this past month, and had diagnosis of encephalopathy. Patient is on multiplemedications, and has a long medical problem list. Patient has no insight into her medications, nor her medical problems.  5 caveat secondary to confusion.  Reportedly, the patient is awake and alert at baseline.   Past Medical History:  Diagnosis Date  . Anxiety   . Arthritis   . Chronic kidney disease   . Diabetes mellitus without complication ([place])   . Dizziness   . Dyspnea   . GERD (gastroesophageal reflux disease)   . Hyperlipidemia   . Hypertension   . Pulmonary embolism ([place]) 2016    Patient Active Problem List   Diagnosis Date Noted  . Hematemesis without nausea   . Hypothermia 09/13/2016  . Acute encephalopathy 09/05/2016  . Hypoglycemia 09/05/2016  . Chronic systolic CHF (congestive heart failure) ([place]) 09/05/2016  . Syncope 08/29/2016  . Chronic idiopathic constipation   . HLD (hyperlipidemia) 07/28/2016  . Fecal impaction of colon ([place]) 07/28/2016  . Fecal impaction ([place])  07/28/2016  . Atrial fibrillation with RVR ([place]) 07/22/2016  . Dehydration   . Renal mass   . Chest pain, rule out acute myocardial infarction 07/12/2016  . Reactive depression   . Esophagitis determined by endoscopy 05/31/2016  . Hyperglycemia   . Hypokalemia   . Cystitis   . HH (hiatus hernia)   . Hiatal hernia with gastroesophageal reflux disease without esophagitis   . GI bleed 05/28/2016  . Upper GI bleeding 05/28/2016  . Pressure injury of skin 05/28/2016  . Atrial flutter ([place])   . Gastroenteritis 04/24/2016  . Aphasia 04/18/2016  . Nausea & vomiting 03/27/2016  . Advance care planning   . UTI (urinary tract infection) 03/12/2016  . Chest pain 03/09/2016  . PE (pulmonary thromboembolism) ([place]) 02/26/2016  . Diabetes mellitus type 2, insulin dependent ([place]) 02/26/2016  . Anemia 02/26/2016  . Chronic kidney disease (CKD), stage III (moderate) 02/26/2016  . Essential hypertension 02/26/2016  . Goals of care, counseling/discussion   . Adult failure to thrive syndrome   . Palliative care by specialist   . Sepsis ([place]) 11/26/2014    Past Surgical History:  Procedure Laterality Date  . APPENDECTOMY    . BACK SURGERY    . ESOPHAGOGASTRODUODENOSCOPY N/A 05/29/2016   Procedure: ESOPHAGOGASTRODUODENOSCOPY (EGD);  Surgeon: Clarene Essex, MD;  Location: [facility];  Service: Gastroenterology;  Laterality: N/A;  . SHOULDER SURGERY Left     OB History    No data available       Home Medications    Prior to Admission medications   Medication Sig  Start Date End Date Taking? Authorizing Provider  amiodarone (PACERONE) 200 MG tablet Take 1 tablet (200 mg total) by mouth daily. 09/08/16   Vaughan Basta, MD  apixaban (ELIQUIS) 2.5 MG TABS tablet Take 1 tablet (2.5 mg total) by mouth 2 (two) times daily. 07/23/16   Gladstone Lighter, MD  atorvastatin (LIPITOR) 20 MG tablet Take 20 mg by mouth at bedtime.     [provider]  docusate sodium (COLACE) 100 MG  capsule Take 1 capsule (100 mg total) by mouth 2 (two) times daily. 08/02/16   Gladstone Lighter, MD  doxycycline (VIBRAMYCIN) 100 MG capsule Take 1 capsule (100 mg total) by mouth 2 (two) times daily. 10/04/16   Orbie Pyo, MD  ferrous sulfate 325 (65 FE) MG tablet Take 1 tablet (325 mg total) by mouth daily with breakfast. 06/03/16   Lorella Nimrod, MD  gabapentin (NEURONTIN) 300 MG capsule Take 300 mg by mouth at bedtime.     [provider]  magnesium oxide (MAG-OX) 400 (241.3 Mg) MG tablet Take 1 tablet (400 mg total) by mouth daily. 04/27/16   Loletha Grayer, MD  megestrol (MEGACE) 40 MG tablet Take 1 tablet (40 mg total) by mouth daily. 08/02/16   Gladstone Lighter, MD  metoCLOPramide (REGLAN) 5 MG tablet Take 1 tablet (5 mg total) by mouth 2 (two) times daily. 06/02/16   Lorella Nimrod, MD  Multiple Vitamin (MULTIVITAMIN WITH MINERALS) TABS tablet Take 1 tablet by mouth daily. 06/02/16   Shela Leff, MD  ondansetron (ZOFRAN-ODT) 4 MG disintegrating tablet Take 4 mg by mouth every 6 (six) hours as needed for nausea or vomiting.    [provider]  oxyCODONE-acetaminophen (PERCOCET) 7.5-325 MG tablet Take 1 tablet by mouth 2 (two) times daily as needed for severe pain. 09/19/16   Dustin Flock, MD  pantoprazole (PROTONIX) 40 MG tablet Take 1 tablet (40 mg total) by mouth 2 (two) times daily. 04/27/16   Loletha Grayer, MD  polyethylene glycol [facility]) packet Take 17 g by mouth daily. 08/02/16   Gladstone Lighter, MD  potassium chloride (K-DUR) 10 MEQ tablet Take 4 tablets (40 mEq total) by mouth 2 (two) times daily. 10/04/16   Schaevitz, Randall An, MD  Potassium Chloride 40 MEQ/15ML (20%) SOLN Take 40 mEq by mouth 2 (two) times daily. 10/04/16   Orbie Pyo, MD  sertraline (ZOLOFT) 25 MG tablet Take 1 tablet (25 mg total) by mouth daily. 06/02/16   Lorella Nimrod, MD  sucralfate (CARAFATE) 1 GM/10ML suspension Take 10 mLs (1 g total) by mouth 4  (four) times daily -  with meals and at bedtime. 06/02/16   Lorella Nimrod, MD  tamsulosin (FLOMAX) 0.4 MG CAPS capsule Take 1 capsule (0.4 mg total) by mouth daily. Patient taking differently: Take 0.4 mg by mouth daily after lunch.  03/14/16   Fritzi Mandes, MD    Family History Family History  Problem Relation Age of Onset  . Diabetes Mother   . Cancer Mother   . Cancer Father   . Bladder Cancer Neg Hx   . Kidney cancer Neg Hx     Social History Social History  Substance Use Topics  . Smoking status: Never Smoker  . Smokeless tobacco: Never Used  . Alcohol use No     Allergies   Patient has no known allergies.   Review of Systems Review of Systems  Unable to perform ROS: Mental status change     Physical Exam Updated Vital Signs BP (!) 182/79 (BP  Location: Right Arm)   Pulse 99   Temp 98.2 F (36.8 C) (Oral)   Resp 20   SpO2 100%   Physical Exam  Constitutional: She appears listless. She has a sickly appearance.  HENT:  Head: Normocephalic and atraumatic.  Eyes: Conjunctivae and EOM are normal.  Cardiovascular: Normal rate and regular rhythm.   Pulmonary/Chest: Effort normal and breath sounds normal. No stridor. No respiratory distress.  Abdominal: She exhibits no distension.  Musculoskeletal: She exhibits no edema.  Neurological: She appears listless. She displays atrophy. No cranial nerve deficit.  Patient moves all extremity spontaneously, has symmetric grip strengthally, 4/5, moves her legs spontaneously. No facial asymmetry speech, when she responses clear, brief period  Skin: Skin is warm and dry.  o substantial erythema appreciated anywhere  Psychiatric: She has a normal mood and affect. Cognition and memory are impaired.  Nursing note and vitals reviewed.    ED Treatments / Results  Labs (all labs ordered are listed, but only abnormal results are displayed) Labs Reviewed  CULTURE, BLOOD (ROUTINE X 2)  CULTURE, BLOOD (ROUTINE X 2)  COMPREHENSIVE  METABOLIC PANEL  CBC WITH DIFFERENTIAL/PLATELET  PROTIME-INR  URINALYSIS, ROUTINE W REFLEX MICROSCOPIC  I-STAT CG4 LACTIC ACID, ED    EKG  EKG Interpretation  Date/Time:  Thursday October 05 2016 14:38:46 EDT Ventricular Rate:  100 PR Interval:    QRS Duration: 144 QT Interval:  420 QTC Calculation: 542 R Axis:   70 Text Interpretation:  Ectopic atrial tachycardia, unifocal Atrial premature complex Left bundle branch block No significant change since last tracing Abnormal ekg Confirmed by Carmin Muskrat [phone]) on 10/05/2016 2:49:09 PM       Radiology US Venous Img Upper Uni Left  Result Date: 10/04/2016 CLINICAL DATA:  Two week history of unexplained left upper extremity edema. EXAM: LEFT UPPER EXTREMITY VENOUS DOPPLER ULTRASOUND TECHNIQUE: Gray-scale sonography with graded compression, as well as color Doppler and duplex ultrasound were performed to evaluate the upper extremity deep venous system from the level of the subclavian vein and including the jugular, axillary, basilic, radial, ulnar and upper cephalic vein. Spectral Doppler was utilized to evaluate flow at rest and with distal augmentation maneuvers. COMPARISON:  09/19/2016. FINDINGS: Contralateral Right Subclavian Vein: Respiratory phasicity is normal and symmetric with the symptomatic side. No evidence of thrombus. Left Internal Jugular Vein: No evidence of thrombus. Normal compressibility, respiratory phasicity and response to augmentation. Subclavian Vein: No evidence of thrombus. Normal compressibility, respiratory phasicity and response to augmentation. Axillary Vein: No evidence of thrombus. Normal compressibility, respiratory phasicity and response to augmentation. Cephalic Vein: Difficult to visualize due to the edema in the upper arm. No visible thrombus. Basilic Vein: No evidence of thrombus. Normal compressibility, respiratory phasicity and response to augmentation. Brachial Veins: No evidence of thrombus. Normal  compressibility, respiratory phasicity and response to augmentation. Radial Veins: No evidence of thrombus. Normal compressibility, respiratory phasicity and response to augmentation. Ulnar Veins: No evidence of thrombus. Normal compressibility, respiratory phasicity and response to augmentation. Venous Reflux:  Not evaluated. Other Findings:  Subcutaneous edema involving the upper arm. IMPRESSION: No evidence of DVT involving the left upper extremity. Electronically Signed   By: Evangeline Dakin M.D.   On: 10/04/2016 17:02   Dg Chest Port 1 View  Result Date: 10/05/2016 CLINICAL DATA:  Altered mental status EXAM: PORTABLE CHEST 1 VIEW COMPARISON:  09/15/2016 FINDINGS: Normal removal of right PICC line. Airspace opacity noted in the right infrahilar region. Possible small right effusion. Left lung is clear.  Heart is normal size. No acute bony abnormality. IMPRESSION: Right infrahilar airspace opacity, atelectasis versus pneumonia. Small right effusion. Electronically Signed   By: Rolm Baptise M.D.   On: 10/05/2016 15:12    Procedures Procedures (including critical care time)  Medications Ordered in ED Medications - No data to display   Initial Impression / Assessment and Plan / ED Course  I have reviewed the triage vital signs and the nursing notes.  Pertinent labs & imaging results that were available during my care of the patient were reviewed by me and considered in my medical decision making (see chart for details).  4:57 PM Patient in similar condition, listless. Blood pressure remains persistently elevated, but patient has not had her CT scan. Initial studies with lactic acidosis, abnormal chest x-ray concerning for possible pneumonia. Patient has received initial antibiotic therapy. Admission is anticipated, but the patient is awaiting her CT scan to exclude new intracranial pathology.   Final Clinical Impressions(s) / ED Diagnoses  Altered mental status Healthcare acquired  pneumonia Lactic acidosis   Carmin Muskrat, MD 10/05/16 1659

## 2016-10-06 ENCOUNTER — Encounter (HOSPITAL_COMMUNITY): Payer: Self-pay | Admitting: Radiology

## 2016-10-06 ENCOUNTER — Inpatient Hospital Stay (HOSPITAL_COMMUNITY): Payer: Medicare Other

## 2016-10-06 DIAGNOSIS — R112 Nausea with vomiting, unspecified: Secondary | ICD-10-CM

## 2016-10-06 DIAGNOSIS — N2889 Other specified disorders of kidney and ureter: Secondary | ICD-10-CM

## 2016-10-06 DIAGNOSIS — N183 Chronic kidney disease, stage 3 (moderate): Secondary | ICD-10-CM

## 2016-10-06 DIAGNOSIS — J181 Lobar pneumonia, unspecified organism: Secondary | ICD-10-CM

## 2016-10-06 DIAGNOSIS — Z515 Encounter for palliative care: Secondary | ICD-10-CM

## 2016-10-06 LAB — CBC
HCT: 32.5 % — ABNORMAL LOW (ref 36.0–46.0)
HEMOGLOBIN: 10.3 g/dL — AB (ref 12.0–15.0)
MCH: 26.5 pg (ref 26.0–34.0)
MCHC: 31.7 g/dL (ref 30.0–36.0)
MCV: 83.8 fL (ref 78.0–100.0)
Platelets: 394 10*3/uL (ref 150–400)
RBC: 3.88 MIL/uL (ref 3.87–5.11)
RDW: 14.9 % (ref 11.5–15.5)
WBC: 7 10*3/uL (ref 4.0–10.5)

## 2016-10-06 LAB — COMPREHENSIVE METABOLIC PANEL
ALT: 11 U/L — ABNORMAL LOW (ref 14–54)
ANION GAP: 14 (ref 5–15)
AST: 42 U/L — ABNORMAL HIGH (ref 15–41)
Albumin: 2.7 g/dL — ABNORMAL LOW (ref 3.5–5.0)
Alkaline Phosphatase: 60 U/L (ref 38–126)
BUN: 10 mg/dL (ref 6–20)
CHLORIDE: 112 mmol/L — AB (ref 101–111)
CO2: 16 mmol/L — AB (ref 22–32)
Calcium: 8.3 mg/dL — ABNORMAL LOW (ref 8.9–10.3)
Creatinine, Ser: 1.2 mg/dL — ABNORMAL HIGH (ref 0.44–1.00)
GFR calc non Af Amer: 39 mL/min — ABNORMAL LOW (ref >60)
GFR, EST AFRICAN AMERICAN: 46 mL/min — AB (ref >60)
Glucose, Bld: 249 mg/dL — ABNORMAL HIGH (ref 65–99)
POTASSIUM: 4.2 mmol/L (ref 3.5–5.1)
SODIUM: 142 mmol/L (ref 135–145)
Total Bilirubin: 2.8 mg/dL — ABNORMAL HIGH (ref 0.3–1.2)
Total Protein: 5.4 g/dL — ABNORMAL LOW (ref 6.5–8.1)

## 2016-10-06 LAB — GLUCOSE, CAPILLARY
GLUCOSE-CAPILLARY: 135 mg/dL — AB (ref 65–99)
GLUCOSE-CAPILLARY: 170 mg/dL — AB (ref 65–99)
Glucose-Capillary: 280 mg/dL — ABNORMAL HIGH (ref 65–99)
Glucose-Capillary: 258 mg/dL — ABNORMAL HIGH (ref 65–99)

## 2016-10-06 LAB — HEMOGLOBIN A1C
HEMOGLOBIN A1C: 5.7 % — AB (ref 4.8–5.6)
MEAN PLASMA GLUCOSE: 116.89 mg/dL

## 2016-10-06 LAB — STREP PNEUMONIAE URINARY ANTIGEN: STREP PNEUMO URINARY ANTIGEN: POSITIVE — AB

## 2016-10-06 MED ORDER — BISACODYL 5 MG PO TBEC: 10.0000 mg | DELAYED_RELEASE_TABLET | Freq: Every day | ORAL | Status: DC | PRN

## 2016-10-06 MED ORDER — INSULIN GLARGINE 100 UNIT/ML ~~LOC~~ SOLN
4.0000 [IU] | Freq: Every day | SUBCUTANEOUS | Status: DC
  Administered 2016-10-06 – 2016-10-11 (×5): 4 [IU] via SUBCUTANEOUS
  Filled 2016-10-06 (×8): qty 0.04

## 2016-10-06 MED ORDER — VANCOMYCIN HCL IN DEXTROSE 750-5 MG/150ML-% IV SOLN
750.0000 mg | INTRAVENOUS | Status: DC
  Administered 2016-10-06: 750 mg via INTRAVENOUS
  Filled 2016-10-06: qty 150

## 2016-10-06 MED ORDER — METOCLOPRAMIDE HCL 5 MG/ML IJ SOLN
5.0000 mg | Freq: Three times a day (TID) | INTRAMUSCULAR | Status: DC
  Administered 2016-10-06 (×2): 5 mg via INTRAVENOUS
  Filled 2016-10-06 (×4): qty 2

## 2016-10-06 MED ORDER — AMLODIPINE BESYLATE 10 MG PO TABS
10.0000 mg | ORAL_TABLET | Freq: Every day | ORAL | Status: DC
  Administered 2016-10-09 – 2016-10-24 (×5): 10 mg via ORAL
  Filled 2016-10-06 (×11): qty 1

## 2016-10-06 MED ORDER — POLYETHYLENE GLYCOL 3350 17 G PO PACK
17.0000 g | PACK | Freq: Every day | ORAL | Status: DC
  Administered 2016-10-10 – 2016-10-14 (×4): 17 g via ORAL
  Filled 2016-10-06 (×8): qty 1

## 2016-10-06 MED ORDER — VANCOMYCIN HCL 500 MG IV SOLR
500.0000 mg | INTRAVENOUS | Status: DC
  Filled 2016-10-06: qty 500

## 2016-10-06 NOTE — Progress Notes (Signed)
Patient ID: Sherry [name], female   DOB: 03-30-1927, 83 y.o.   MRN: [id number]  PROGRESS NOTE    Sherry [name]  NKN:[id number] DOB: 1927/05/22 DOA: 10/05/2016 PCP: [name], MD   Brief Narrative:  83 y.o. female with medical history significant of hypertension, diabetes, chronic kidney disease, pulmonary embolism on oral anticoagulation, hyperlipidemia, questionable dementia and recent admission and discharge on 09/19/2016 at [facility] for acute encephalopathy due to severe hypoglycemia and hematemesis with possible upper GI bleed was sent from nursing home for altered mental status. She was admitted with probable pneumonia.  Assessment & Plan:   Principal Problem:   Pneumonia Active Problems:   Chronic kidney disease (CKD), stage III (moderate)   Essential hypertension   Nausea & vomiting   Probable healthcare associated pneumonia - With a concern for multidrug resistant gram-negative rods and MRSA - Continue cefepime and vancomycin. Discontinue vancomycin tomorrow if cultures negative for MRSA. Follow cultures. - Oxygen supplementation if needed - SLP evaluation and diet as per SLP recommendations - Aspiration precautions  Altered mental status with probable acute metabolic encephalopathy - Patient's baseline mental status is unknown; patient might have dementia as well - We will monitor mental status - Fall precautions - Mental status is still poor. Might consider doing MRI of the brain if mental status does not improve  Nausea and vomiting - Questionable cause. Might be related to pneumonia. - X-ray of her abdomen is inconclusive. We will get CAT scan of the abdomen  Chronic kidney disease stage III - Creatinine stable. Decrease normal saline to 75 mL an hour  History of pulmonary embolism - Continue anticoagulation with Eliquis  Probable chronic atrial fibrillation - Continue anticoagulation. Continue amiodarone  Hypertension - Blood  pressure on the higher side. Monitor blood pressure. Start amlodipine  Diabetes mellitus type 2 - Accu-Cheks with insulin sliding scale coverage - We'll start Lantus at bedtime  Generalized deconditioning -PT eval. - Palliative care consult for goals of care. Overall prognosis is guarded to poor  Hypokalemia - Improved  Right renal mass - Being followed by urology as an outpatient? - We will obtain CT of the abdomen and consult urology if needed - Wait for palliative care recommendations as well   DVT prophylaxis: Eliquis Code Status:  DO NOT RESUSCITATE Family Communication: None at bedside Disposition Plan: Depends on clinical outcome  Consultants: Palliative care  Procedures: None  Antimicrobials: Cefepime and vancomycin from 10/05/2016    Subjective: Patient seen and examined at bedside. She is awake but does not answer any questions. No overnight fever.  Objective: Vitals:   10/06/16 0018 10/06/16 0100 10/06/16 0544 10/06/16 0552  BP: (!) 143/69  (!) 179/88 (!) 179/88  Pulse:    (!) 109  Resp:    16  Temp:    (!) 97.5 F (36.4 C)  TempSrc:    Oral  SpO2:    100%  Weight:      Height:  5\' 3"  (1.6 m)      Intake/Output Summary (Last 24 hours) at 10/06/16 1320 Last data filed at 10/06/16 0600  Gross per 24 hour  Intake             1600 ml  Output                0 ml  Net             1600 ml   Filed Weights   10/05/16 2049  Weight: 48.7 kg (  107 lb 4.8 oz)    Examination:  General exam: Appears Very frail and ill. Respiratory system: Bilateral decreased breath sound at bases with scattered crackles Cardiovascular system: S1 & S2 heard, rate controlled.  Gastrointestinal system: Abdomen is nondistended, soft and mild epigastric tenderness. Normal bowel sounds heard. Central nervous system: Awake but does not answer any questions. No focal neurological deficits. Moving extremities Extremities: No cyanosis, clubbing, edema  Skin: No rashes, lesions  or ulcers Lymph: No cervical lymphadenopathy.     Data Reviewed: I have personally reviewed following labs and imaging studies  CBC:  Recent Labs Lab 10/04/16 1659 10/05/16 1535 10/06/16 0448  WBC 5.1 3.6* 7.0  NEUTROABS 2.8 2.8  --   HGB 9.9* 10.9* 10.3*  HCT 29.9* 34.3* 32.5*  MCV 84.1 84.9 83.8  PLT 349 394 160   Basic Metabolic Panel:  Recent Labs Lab 10/04/16 1659 10/05/16 1535 10/06/16 0448  NA 144 140 142  K 2.7* 3.3* 4.2  CL 109 105 112*  CO2 21* 12* 16*  GLUCOSE 139* 294* 249*  BUN 15 11 10   CREATININE 1.12* 1.38* 1.20*  CALCIUM 8.4* 8.8* 8.3*   GFR: Estimated Creatinine Clearance: 25.4 mL/min (A) (by C-G formula based on SCr of 1.2 mg/dL (H)). Liver Function Tests:  Recent Labs Lab 10/04/16 1659 10/05/16 1535 10/06/16 0448  AST 13* 17 42*  ALT 10* 11* 11*  ALKPHOS 58 73 60  BILITOT 0.9 1.3* 2.8*  PROT 5.4* 6.5 5.4*  ALBUMIN 2.4* 3.0* 2.7*   No results for input(s): LIPASE, AMYLASE in the last 168 hours. No results for input(s): AMMONIA in the last 168 hours. Coagulation Profile:  Recent Labs Lab 10/04/16 1659 10/05/16 1535  INR 0.99 0.98   Cardiac Enzymes: No results for input(s): CKTOTAL, CKMB, CKMBINDEX, TROPONINI in the last 168 hours. BNP (last 3 results) No results for input(s): PROBNP in the last 8760 hours. HbA1C:  Recent Labs  10/06/16 0448  HGBA1C 5.7*   CBG:  Recent Labs Lab 10/05/16 1536 10/05/16 2235 10/06/16 0755 10/06/16 1231  GLUCAP 269* 230* 280* 258*   Lipid Profile: No results for input(s): CHOL, HDL, LDLCALC, TRIG, CHOLHDL, LDLDIRECT in the last 72 hours. Thyroid Function Tests: No results for input(s): TSH, T4TOTAL, FREET4, T3FREE, THYROIDAB in the last 72 hours. Anemia Panel: No results for input(s): VITAMINB12, FOLATE, FERRITIN, TIBC, IRON, RETICCTPCT in the last 72 hours. Sepsis Labs:  Recent Labs Lab 10/05/16 1548  LATICACIDVEN 2.20*    Recent Results (from the past 240 hour(s))    Culture, blood (Routine x 2)     Status: None (Preliminary result)   Collection Time: 10/05/16  3:34 PM  Result Value Ref Range Status   Specimen Description BLOOD RIGHT ANTECUBITAL  Final   Special Requests IN PEDIATRIC BOTTLE Blood Culture adequate volume  Final   Culture NO GROWTH < 24 HOURS  Final   Report Status PENDING  Incomplete  Culture, blood (Routine x 2)     Status: None (Preliminary result)   Collection Time: 10/05/16  3:35 PM  Result Value Ref Range Status   Specimen Description BLOOD LEFT ANTECUBITAL  Final   Special Requests   Final    BOTTLES DRAWN AEROBIC AND ANAEROBIC Blood Culture adequate volume   Culture NO GROWTH < 24 HOURS  Final   Report Status PENDING  Incomplete         Radiology Studies: Dg Abd 1 View  Result Date: 10/05/2016 CLINICAL DATA:  Abdominal pain with nausea EXAM:  ABDOMEN - 1 VIEW COMPARISON:  08/01/2016 FINDINGS: Visualized lung bases are clear. Mild scoliosis of the spine. Nonspecific diffuse decreased bowel gas. Mild formed stool in the rectum. Small calcified phleboliths in the pelvis. IMPRESSION: Nonspecific diffuse decreased bowel gas. Mild formed stool in the rectum. Electronically Signed   By: Donavan Foil M.D.   On: 10/05/2016 21:20   Ct Head Wo Contrast  Result Date: 10/05/2016 CLINICAL DATA:  Altered level of consciousness. EXAM: CT HEAD WITHOUT CONTRAST TECHNIQUE: Contiguous axial images were obtained from the base of the skull through the vertex without intravenous contrast. COMPARISON:  CT scan of September 05, 2016. FINDINGS: Brain: Mild diffuse cortical atrophy is noted. Mild chronic ischemic white matter disease is noted. No mass effect or midline shift is noted. Ventricular size is within normal limits. There is no evidence of mass lesion, hemorrhage or acute infarction. Vascular: No hyperdense vessel or unexpected calcification. Skull: Normal. Negative for fracture or focal lesion. Sinuses/Orbits: No acute finding. Other: None.  IMPRESSION: Mild diffuse cortical atrophy. Mild chronic ischemic white matter disease. No acute intracranial abnormality seen. Electronically Signed   By: Marijo Conception, M.D.   On: 10/05/2016 17:10   US Venous Img Upper Uni Left  Result Date: 10/04/2016 CLINICAL DATA:  Two week history of unexplained left upper extremity edema. EXAM: LEFT UPPER EXTREMITY VENOUS DOPPLER ULTRASOUND TECHNIQUE: Gray-scale sonography with graded compression, as well as color Doppler and duplex ultrasound were performed to evaluate the upper extremity deep venous system from the level of the subclavian vein and including the jugular, axillary, basilic, radial, ulnar and upper cephalic vein. Spectral Doppler was utilized to evaluate flow at rest and with distal augmentation maneuvers. COMPARISON:  09/19/2016. FINDINGS: Contralateral Right Subclavian Vein: Respiratory phasicity is normal and symmetric with the symptomatic side. No evidence of thrombus. Left Internal Jugular Vein: No evidence of thrombus. Normal compressibility, respiratory phasicity and response to augmentation. Subclavian Vein: No evidence of thrombus. Normal compressibility, respiratory phasicity and response to augmentation. Axillary Vein: No evidence of thrombus. Normal compressibility, respiratory phasicity and response to augmentation. Cephalic Vein: Difficult to visualize due to the edema in the upper arm. No visible thrombus. Basilic Vein: No evidence of thrombus. Normal compressibility, respiratory phasicity and response to augmentation. Brachial Veins: No evidence of thrombus. Normal compressibility, respiratory phasicity and response to augmentation. Radial Veins: No evidence of thrombus. Normal compressibility, respiratory phasicity and response to augmentation. Ulnar Veins: No evidence of thrombus. Normal compressibility, respiratory phasicity and response to augmentation. Venous Reflux:  Not evaluated. Other Findings:  Subcutaneous edema involving the  upper arm. IMPRESSION: No evidence of DVT involving the left upper extremity. Electronically Signed   By: Evangeline Dakin M.D.   On: 10/04/2016 17:02   Dg Chest Port 1 View  Result Date: 10/05/2016 CLINICAL DATA:  Altered mental status EXAM: PORTABLE CHEST 1 VIEW COMPARISON:  09/15/2016 FINDINGS: Normal removal of right PICC line. Airspace opacity noted in the right infrahilar region. Possible small right effusion. Left lung is clear. Heart is normal size. No acute bony abnormality. IMPRESSION: Right infrahilar airspace opacity, atelectasis versus pneumonia. Small right effusion. Electronically Signed   By: Rolm Baptise M.D.   On: 10/05/2016 15:12        Scheduled Meds: . amiodarone  200 mg Oral Daily  . apixaban  2.5 mg Oral BID  . atorvastatin  20 mg Oral q1800  . docusate sodium  100 mg Oral BID  . gabapentin  300 mg Oral QHS  .  insulin aspart  0-9 Units Subcutaneous TID WC  . insulin glargine  4 Units Subcutaneous QHS  . pantoprazole  40 mg Oral BID  . potassium chloride  40 mEq Oral BID  . sucralfate  1 g Oral TID WC & HS  . tamsulosin  0.4 mg Oral QPC breakfast   Continuous Infusions: . sodium chloride 100 mL/hr at 10/06/16 0600  . ceFEPime (MAXIPIME) IV    . vancomycin       LOS: 1 day        Aline August, MD [facility] Pager [phone]  If 7PM-7AM, please contact night-coverage [email or web address] Password [email or web address] 10/06/2016, 1:20 PM

## 2016-10-06 NOTE — Care Management Note (Signed)
Case Management Note  Patient Details  Name: Sherry [name] MRN: [id number] Date of Birth: 09-Jan-1928  Subjective/Objective:                    Action/Plan:  Patient from [facility]  Expected Discharge Date:                  Expected Discharge Plan:  [place]  In-House Referral:  Clinical Social Work  Discharge planning Services     Post Acute Care Choice:    Choice offered to:     DME Arranged:    DME Agency:     HH Arranged:    [place] Agency:     Status of Service:  In process, will continue to follow  If discussed at Long Length of Stay Meetings, dates discussed:    Additional Comments:  Marilu Favre, RN 10/06/2016, 10:01 AM

## 2016-10-06 NOTE — Consult Note (Signed)
Consultation Note Date: 10/06/2016   Patient Name: Sherry [name]  DOB: 1927/04/19  MRN: [id number]  Age / Sex: 83 y.o., female  PCP: Sherry Richards, MD Referring Physician: Aline August, MD  Reason for Consultation: Establishing goals of care and Psychosocial/spiritual support  HPI/Patient Profile: 83 y.o. female  with past medical history of Hypertension, diabetes, chronic kidney disease, pulmonary embolism on anticoagulation, hyperlipidemia, renal pelvis mass, 11 admissions in 6 months admitted on 10/05/2016 with altered mental status from skilled nursing facility. Additionally, patient was complaining of nausea. Patient has been seen by palliative medicine providers multiple times during her hospitalizations just in 2018. Just discharged from [facility] on 09/19/2016 with encephalopathy and hypoglycemia, questionable upper GI bleed. She was discharged to [facility] for rehabilitation.  Consult for goals of care.   Clinical Assessment and Goals of Care: Patient appears very ill. She is alert and attempts to speak to me but is confused. She is denying pain but states she is nauseated. She is unable to participate in goals of care discussion secondary to illness. In the past she has lived with her son Sherry [name] (per chart review),; Sherry [name] is listed as her healthcare proxy. Also per chart review, note dictated by palliative medicine provider Sherry [name] 09/18/16, patient had an active Adult Protective Services case open  Per chart review, and review of other palliative medicine provider notes, nausea and vomiting was present during her admission in July 2018. She also has a known renal pelvic mass with an indeterminate pathology report performed in May 2018. She is very frail; weighs 107 pounds with an albumin of 2.7. She is still complaining of nausea    SUMMARY OF RECOMMENDATIONS   Per chart  review she is DO NOT RESUSCITATE. She has had inconsistent decisions regarding her CODE STATUS Per chart review, family has been difficult to obtain goals of care meeting within the past; either not responding to messages or not showing up for meetings. Staffed with Sherry [name], specifically ongoing reports of nausea and vomiting, right renal pelvic mass, APS Left message with patient's daughter, Sherry [name] [phone] Code Status/Advance Care Planning:  DNR    Symptom Management:   Nausea and vomiting: Continue with Zofran as needed. Imaging reflects moderate amount of stool burden. Recommend Dulcolax suppository and or enema and see if this helps her nausea. Reglan IV 5 mg every 8 hours may also be an option while she is in the hospital  Palliative Prophylaxis:   Aspiration, Bowel Regimen, Delirium Protocol, Frequent Pain Assessment, Oral Care and Turn Reposition    Psycho-social/Spiritual:   Desire for further Chaplaincy support:no  Prognosis:   Unable to determine  Discharge Planning: To Be Determined      Primary Diagnoses: Present on Admission: . Pneumonia . Nausea & vomiting . Essential hypertension . Chronic kidney disease (CKD), stage III (moderate)   I have reviewed the medical record, interviewed the patient and family, and examined the patient. The following aspects are pertinent.  Past Medical History:  Diagnosis  Date  . Anxiety   . Arthritis   . Chronic kidney disease   . Diabetes mellitus without complication ([place])   . Dizziness   . Dyspnea   . GERD (gastroesophageal reflux disease)   . Hyperlipidemia   . Hypertension   . Pulmonary embolism ([place]) 2016   Social History   Social History  . Marital status: Single    Spouse name: N/A  . Number of children: N/A  . Years of education: N/A   Social History Main Topics  . Smoking status: Never Smoker  . Smokeless tobacco: Never Used  . Alcohol use No  . Drug use: No  . Sexual activity: No    Other Topics Concern  . None   Social History Narrative  . None   Family History  Problem Relation Age of Onset  . Diabetes Mother   . Cancer Mother   . Cancer Father   . Bladder Cancer Neg Hx   . Kidney cancer Neg Hx    Scheduled Meds: . amiodarone  200 mg Oral Daily  . amLODipine  10 mg Oral Daily  . apixaban  2.5 mg Oral BID  . atorvastatin  20 mg Oral q1800  . docusate sodium  100 mg Oral BID  . gabapentin  300 mg Oral QHS  . insulin aspart  0-9 Units Subcutaneous TID WC  . insulin glargine  4 Units Subcutaneous QHS  . pantoprazole  40 mg Oral BID  . potassium chloride  40 mEq Oral BID  . sucralfate  1 g Oral TID WC & HS  . tamsulosin  0.4 mg Oral QPC breakfast   Continuous Infusions: . sodium chloride 100 mL/hr at 10/06/16 0600  . ceFEPime (MAXIPIME) IV    . vancomycin     PRN Meds:.acetaminophen **OR** acetaminophen, hydrALAZINE, metoprolol tartrate, ondansetron **OR** ondansetron (ZOFRAN) IV, oxyCODONE-acetaminophen Medications Prior to Admission:  Prior to Admission medications   Medication Sig Start Date End Date Taking? Authorizing Provider  amiodarone (PACERONE) 200 MG tablet Take 1 tablet (200 mg total) by mouth daily. 09/08/16  Yes Vaughan Basta, MD  apixaban (ELIQUIS) 2.5 MG TABS tablet Take 1 tablet (2.5 mg total) by mouth 2 (two) times daily. 07/23/16  Yes Gladstone Lighter, MD  atorvastatin (LIPITOR) 20 MG tablet Take 20 mg by mouth at bedtime.    Yes [provider]  docusate sodium (COLACE) 100 MG capsule Take 1 capsule (100 mg total) by mouth 2 (two) times daily. 08/02/16  Yes Gladstone Lighter, MD  doxycycline (VIBRAMYCIN) 100 MG capsule Take 1 capsule (100 mg total) by mouth 2 (two) times daily. 10/04/16  Yes Orbie Pyo, MD  ferrous sulfate 325 (65 FE) MG tablet Take 1 tablet (325 mg total) by mouth daily with breakfast. 06/03/16  Yes Lorella Nimrod, MD  gabapentin (NEURONTIN) 300 MG capsule Take 300 mg by mouth at  bedtime.    Yes [provider]  magnesium oxide (MAG-OX) 400 (241.3 Mg) MG tablet Take 1 tablet (400 mg total) by mouth daily. 04/27/16  Yes Wieting, Richard, MD  megestrol (MEGACE) 40 MG tablet Take 1 tablet (40 mg total) by mouth daily. 08/02/16  Yes Gladstone Lighter, MD  metoCLOPramide (REGLAN) 5 MG tablet Take 1 tablet (5 mg total) by mouth 2 (two) times daily. 06/02/16  Yes Lorella Nimrod, MD  Multiple Vitamin (MULTIVITAMIN WITH MINERALS) TABS tablet Take 1 tablet by mouth daily. 06/02/16  Yes Shela Leff, MD  ondansetron (ZOFRAN-ODT) 4 MG disintegrating tablet Take 4 mg  by mouth every 6 (six) hours as needed for nausea or vomiting.   Yes [provider]  oxyCODONE-acetaminophen (PERCOCET) 7.5-325 MG tablet Take 1 tablet by mouth 2 (two) times daily as needed for severe pain. 09/19/16  Yes Dustin Flock, MD  pantoprazole (PROTONIX) 40 MG tablet Take 1 tablet (40 mg total) by mouth 2 (two) times daily. 04/27/16  Yes Wieting, Richard, MD  polyethylene glycol [facility]) packet Take 17 g by mouth daily. 08/02/16  Yes Gladstone Lighter, MD  Potassium Chloride 40 MEQ/15ML (20%) SOLN Take 40 mEq by mouth 2 (two) times daily. 10/04/16  Yes Schaevitz, Randall An, MD  Potassium Chloride 40 MEQ/15ML (20%) SOLN Take 15 mLs by mouth 2 (two) times daily.   Yes [provider]  promethazine (PHENERGAN) 25 MG tablet Take 25 mg by mouth every 6 (six) hours as needed for nausea or vomiting.   Yes [provider]  sertraline (ZOLOFT) 25 MG tablet Take 1 tablet (25 mg total) by mouth daily. 06/02/16  Yes Lorella Nimrod, MD  sucralfate (CARAFATE) 1 GM/10ML suspension Take 10 mLs (1 g total) by mouth 4 (four) times daily -  with meals and at bedtime. 06/02/16  Yes Lorella Nimrod, MD  tamsulosin (FLOMAX) 0.4 MG CAPS capsule Take 1 capsule (0.4 mg total) by mouth daily. Patient taking differently: Take 0.4 mg by mouth daily after breakfast.  03/14/16  Yes Fritzi Mandes, MD  potassium  chloride (K-DUR) 10 MEQ tablet Take 4 tablets (40 mEq total) by mouth 2 (two) times daily. 10/04/16   Schaevitz, Randall An, MD   No Known Allergies Review of Systems  Unable to perform ROS: Mental status change    Physical Exam  Constitutional:  Acutely ill appearing elderly female lying in bed; minimally interactive  HENT:  Head: Normocephalic and atraumatic.  Pulmonary/Chest: Effort normal.  Musculoskeletal: Normal range of motion.  Neurological: She is alert.  Skin: Skin is warm and dry. There is pallor.  Psychiatric:  Confused  Nursing note and vitals reviewed.   Vital Signs: BP (!) 179/88 (BP Location: Left Wrist)   Pulse (!) 109   Temp (!) 97.5 F (36.4 C) (Oral)   Resp 16   Ht 5\' 3"  (1.6 m)   Wt 48.7 kg (107 lb 4.8 oz)   SpO2 100%   BMI 19.01 kg/m  Pain Assessment: PAINAD   Pain Score: Asleep   SpO2: SpO2: 100 % O2 Device:SpO2: 100 % O2 Flow Rate: .   IO: Intake/output summary:  Intake/Output Summary (Last 24 hours) at 10/06/16 1329 Last data filed at 10/06/16 0600  Gross per 24 hour  Intake             1600 ml  Output                0 ml  Net             1600 ml    LBM: Last BM Date: 10/05/16 Baseline Weight: Weight: 48.7 kg (107 lb 4.8 oz) Most recent weight: Weight: 48.7 kg (107 lb 4.8 oz)     Palliative Assessment/Data:   Flowsheet Rows     Most Recent Value  Intake Tab  Referral Department  Hospitalist  Unit at Time of Referral  Med/Surg Unit  Palliative Care Primary Diagnosis  Other (Comment)  Date Notified  10/05/16  Palliative Care Type  Return patient Palliative Care  Reason for referral  Clarify Goals of Care, Psychosocial or Spiritual support  Date of Admission  10/05/16  Date first seen by Palliative Care  10/06/16  # of days Palliative referral response time  1 Day(s)  # of days IP prior to Palliative referral  0  Clinical Assessment  Palliative Performance Scale Score  30%  Pain Max last 24 hours  Not able to report  Pain  Min Last 24 hours  Not able to report  Dyspnea Max Last 24 Hours  Not able to report  Dyspnea Min Last 24 hours  Not able to report  Nausea Max Last 24 Hours  Not able to report  Nausea Min Last 24 Hours  Not able to report  Anxiety Max Last 24 Hours  Not able to report  Anxiety Min Last 24 Hours  Not able to report  Other Max Last 24 Hours  Not able to report  Psychosocial & Spiritual Assessment  Palliative Care Outcomes      Time In: 1230 Time Out: 1325 Time Total: 55 min Greater than 50%  of this time was spent counseling and coordinating care related to the above assessment and plan.  Signed by: Dory Horn, NP   Please contact Palliative Medicine Team phone at [phone] for questions and concerns.  For individual provider: See Shea Evans

## 2016-10-06 NOTE — Progress Notes (Signed)
Inpatient Diabetes Program Recommendations  AACE/ADA: New Consensus Statement on Inpatient Glycemic Control (2015)  Target Ranges:  Prepandial:   less than 140 mg/dL      Peak postprandial:   less than 180 mg/dL (1-2 hours)      Critically ill patients:  140 - 180 mg/dL   Review of Glycemic Control  Diabetes history: DM 2 Outpatient Diabetes medications: None Current orders for Inpatient glycemic control: Novolog Sensitive Correction 0-9 units tid  A1c 5.7% on 9/28  Inpatient Diabetes Program Recommendations:    Glucose in 200 range. Consider very low dose basal insulin, Lantus 4 units (0.1 units/kg) Q24 hours.  Thanks,  Tama Headings RN, MSN, [facility] Inpatient Diabetes Coordinator Team Pager [phone] (8a-5p)

## 2016-10-06 NOTE — Evaluation (Signed)
Clinical/Bedside Swallow Evaluation Patient Details  Name: Sherry [name] MRN: [id number] Date of Birth: 1927-12-03  Today's Date: 10/06/2016 Time: SLP Start Time (ACUTE ONLY): 0830 SLP Stop Time (ACUTE ONLY): 0845 SLP Time Calculation (min) (ACUTE ONLY): 15 min  Past Medical History:  Past Medical History:  Diagnosis Date  . Anxiety   . Arthritis   . Chronic kidney disease   . Diabetes mellitus without complication ([place])   . Dizziness   . Dyspnea   . GERD (gastroesophageal reflux disease)   . Hyperlipidemia   . Hypertension   . Pulmonary embolism ([place]) 2016   Past Surgical History:  Past Surgical History:  Procedure Laterality Date  . APPENDECTOMY    . BACK SURGERY    . ESOPHAGOGASTRODUODENOSCOPY N/A 05/29/2016   Procedure: ESOPHAGOGASTRODUODENOSCOPY (EGD);  Surgeon: Clarene Essex, MD;  Location: [facility];  Service: Gastroenterology;  Laterality: N/A;  . SHOULDER SURGERY Left    HPI:  Sherry [name] a 83 y.o.femalewith medical history of hypertension, diabetes, chronic kidney disease, pulmonary embolism on oral anticoagulation, hyperlipidemia, questionable dementia and recent admission and discharge on 09/19/2016 at [facility] for acute encephalopathy due to severe hypoglycemia and hematemesis with possible upper GI bleed.  Now sent to hospital from nursing home for altered mental status.     Assessment / Plan / Recommendation Clinical Impression   Pt's primary factors affecting her swallow at this time are her lethargy and acute illness which impact her ability to remain upright and alert for safe PO consumption.  Pt was nauseated throughout evaluation and frequently attempted to gag herself to induce vomiting with no success.  As a result, POs were limited during evaluation and pt declined even pureed solids.  Pt did consume thin liquids via straw without overt s/s of aspiration despite suboptimal positioning and lethargy.  Additionally, pt had no focal oral  motor or palpable pharyngeal weakness and her swallow response subjectively appeared timely.  As a result, will recommend that pt remain on her current diet with full supervision to ensure safety with PO consumption.  SLP is hopeful that as nausea and lethargy subside that pt will be able to resume a regular diet.  Will defer initiation of solids to MD given recent history of concerns for GI bleed.                        SLP Visit Diagnosis: Dysphagia, unspecified (R13.10)    Aspiration Risk  Mild aspiration risk    Diet Recommendation  (clear liquids)   Liquid Administration via: Cup;Straw Medication Administration: Whole meds with liquid Supervision: Patient able to self feed;Full supervision/cueing for compensatory strategies Compensations: Small sips/bites;Slow rate Postural Changes: Seated upright at 90 degrees    Other  Recommendations Oral Care Recommendations: Oral care BID   Follow up Recommendations Other (comment) (TBD)      Frequency and Duration min 2x/week          Prognosis Prognosis for Safe Diet Advancement: Good Barriers to Reach Goals: Behavior;Medication;Motivation      Swallow Study   General Date of Onset:  (See HPI) HPI: Sherry [name] a 83 y.o.femalewith medical history of hypertension, diabetes, chronic kidney disease, pulmonary embolism on oral anticoagulation, hyperlipidemia, questionable dementia and recent admission and discharge on 09/19/2016 at [facility] for acute encephalopathy due to severe hypoglycemia and hematemesis with possible upper GI bleed.  Now sent to hospital from nursing home for altered mental status.   Type  of Study: Bedside Swallow Evaluation Previous Swallow Assessment: none on record Diet Prior to this Study: Other (Comment) (clear liquids diet) Temperature Spikes Noted: No Respiratory Status: Room air History of Recent Intubation: No Behavior/Cognition: Lethargic/Drowsy;Requires  cueing;Uncooperative;Confused Oral Cavity Assessment:  (thin, ropey secretions) Oral Care Completed by SLP: Yes Oral Cavity - Dentition: Dentures, top;Dentures, bottom Vision: Functional for self-feeding Self-Feeding Abilities: Able to feed self Patient Positioning: Postural control adequate for testing Baseline Vocal Quality: Low vocal intensity Volitional Cough: Weak    Oral/Motor/Sensory Function Overall Oral Motor/Sensory Function: Within functional limits   Ice Chips     Thin Liquid Thin Liquid: Within functional limits Presentation: Straw    Nectar Thick     Honey Thick     Puree     Solid   GO            Senia Even, Elmyra Ricks L 10/06/2016,8:58 AM

## 2016-10-06 NOTE — Progress Notes (Signed)
PT Cancellation Note  Patient Details Name: Sherry [name] MRN: [id number] DOB: [birth year]   Cancelled Treatment:    Reason Eval/Treat Not Completed: Fatigue/lethargy limiting ability to participate;Other (comment). Pt opened eyes to PT's greeting and repeated cues for eye opening. When asked name and other questions, pt just shaking head and verbalizing no. Pt not interested in sitting up to eat lunch (did not look like she had touched breakfast either). Noticed increased swelling in RUE, attempted to reposition with pillow, but pt just moved her arm back to original position despite education and encouragement. Will plan for PT evaluation tomorrow pending pt participation.  Mabeline Caras, PT, DPT Acute Rehab Services  Pager: [place] 10/06/2016, 2:24 PM

## 2016-10-06 NOTE — Evaluation (Signed)
Occupational Therapy Evaluation Patient Details Name: Sherry [name] MRN: [id number] DOB: 10/10/1927 Today's Date: 10/06/2016    History of Present Illness Pt admitted from SNF where she was receiving rehab with PNA. Recent admission ot [facility] with severe hypoglycemia, hemataemesis. PMH: HTN, DM, CKD, PE, ?dementia.    Clinical Impression   Pt with minimal interaction with therapist this visit. Complaining of feeling "sick on my stomach." Assisted pt to sit EOB, but pt returning to sidelying quickly. Declined OOB. Pt putting her finger down her throat in attempt to vomit. RN notified. Will follow acutely. Will need post acute rehab in SNF.    Follow Up Recommendations  SNF;Supervision/Assistance - 24 hour    Equipment Recommendations       Recommendations for Other Services       Precautions / Restrictions Precautions Precautions: Fall Restrictions Weight Bearing Restrictions: No      Mobility Bed Mobility Overal bed mobility: Needs Assistance Bed Mobility: Sidelying to Sit;Sit to Sidelying   Sidelying to sit: Mod assist     Sit to sidelying: Mod assist General bed mobility comments: assist to raise trunk and for hips to EOB, assisted LE back into bed  Transfers                 General transfer comment: pt declined, returning to side lying after less than at minute at EOB    Balance Overall balance assessment: Needs assistance Sitting-balance support: Feet supported Sitting balance-Leahy Scale: Fair                                     ADL either performed or assessed with clinical judgement   ADL                                         General ADL Comments: Pt with minimal participation in session today.     Vision         Perception     Praxis      Pertinent Vitals/Pain Pain Assessment: No/denies pain     Hand Dominance Right   Extremity/Trunk Assessment Upper Extremity Assessment Upper Extremity Assessment:  Generalized weakness (edematous)   Lower Extremity Assessment Lower Extremity Assessment: Defer to PT evaluation       Communication Communication Communication: HOH   Cognition Arousal/Alertness: Awake/alert Behavior During Therapy: Flat affect Overall Cognitive Status: Impaired/Different from baseline Area of Impairment: Orientation;Memory                 Orientation Level: Disoriented to;Place;Time;Situation   Memory: Decreased short-term memory         General Comments: pt placing her finger down her throat in attempt to vomit, reports feeling sick on her stomach, RN notified   General Comments       Exercises     Shoulder Instructions      Home Living Family/patient expects to be discharged to:: Skilled nursing facility Living Arrangements: Children (son) Available Help at Discharge: Family;Available PRN/intermittently (son works) Type of Home: House Home Access: Level entry     [place]: One level     Bathroom Shower/Tub: Occupational psychologist: Standard (placed BSC over)     Home Equipment: Hospital bed;Shower seat;Wheelchair - manual;Bedside commode;Walker - 4 wheels;Grab bars - tub/shower   Additional Comments: information gathered  from previous admission, pt minimally verbalizing      Prior Functioning/Environment Level of Independence: Needs assistance  Gait / Transfers Assistance Needed: Ambulates with 4ww to bathroom but also uses manual w/c in home as well (pushes self in w/c unless son is around). ADL's / Homemaking Assistance Needed: Daughter assists with medications and food per pt report; son assists with meals and cleaning/housework per pt report; pt reports performing own sponge baths.            OT Problem List: Decreased strength;Impaired balance (sitting and/or standing);Decreased cognition;Decreased knowledge of use of DME or AE;Decreased activity tolerance      OT Treatment/Interventions: Self-care/ADL  training;DME and/or AE instruction;Patient/family education;Balance training;Therapeutic activities;Cognitive remediation/compensation    OT Goals(Current goals can be found in the care plan section) Acute Rehab OT Goals Patient Stated Goal: nausea relief OT Goal Formulation: Patient unable to participate in goal setting Time For Goal Achievement: 10/20/16 Potential to Achieve Goals: Fair ADL Goals Pt Will Perform Grooming: with min assist;sitting Pt Will Perform Upper Body Dressing: with min assist;sitting Pt Will Perform Lower Body Dressing: with min assist;sit to/from stand Pt Will Transfer to Toilet: with min assist;ambulating;bedside commode Pt Will Perform Toileting - Clothing Manipulation and hygiene: with min assist;sit to/from stand Additional ADL Goal #1: Pt will perform bed mobility with min guard assist. Additional ADL Goal #2: Pt will respond to time and place accurately using environmental cues.  OT Frequency: Min 2X/week   Barriers to D/C: Decreased caregiver support          Co-evaluation              AM-PAC PT "6 Clicks" Daily Activity     Outcome Measure Help from another person eating meals?: Total Help from another person taking care of personal grooming?: Total Help from another person toileting, which includes using toliet, bedpan, or urinal?: Total Help from another person bathing (including washing, rinsing, drying)?: Total Help from another person to put on and taking off regular upper body clothing?: Total Help from another person to put on and taking off regular lower body clothing?: Total 6 Click Score: 6   End of Session Nurse Communication:  (pt with nausea)  Activity Tolerance: Treatment limited secondary to medical complications (Comment) (nausea) Patient left: in bed;with call bell/phone within reach;with bed alarm set  OT Visit Diagnosis: Muscle weakness (generalized) (M62.81);Other symptoms and signs involving cognitive function                 Time: 0945-1000 OT Time Calculation (min): 15 min Charges:  OT General Charges $OT Visit: 1 Visit OT Evaluation $OT Eval Moderate Complexity: 1 Mod G-Codes:     10-18-16 Nestor Lewandowsky, OTR/L Pager: [phone]  Werner Lean, Haze Boyden 10-18-2016, 10:11 AM

## 2016-10-07 LAB — CBC WITH DIFFERENTIAL/PLATELET
BASOS PCT: 0 %
Basophils Absolute: 0 10*3/uL (ref 0.0–0.1)
Eosinophils Absolute: 0 10*3/uL (ref 0.0–0.7)
Eosinophils Relative: 0 %
HEMATOCRIT: 31.3 % — AB (ref 36.0–46.0)
HEMOGLOBIN: 10.4 g/dL — AB (ref 12.0–15.0)
LYMPHS ABS: 0.8 10*3/uL (ref 0.7–4.0)
LYMPHS PCT: 13 %
MCH: 27.3 pg (ref 26.0–34.0)
MCHC: 33.2 g/dL (ref 30.0–36.0)
MCV: 82.2 fL (ref 78.0–100.0)
MONOS PCT: 4 %
Monocytes Absolute: 0.3 10*3/uL (ref 0.1–1.0)
NEUTROS ABS: 5.1 10*3/uL (ref 1.7–7.7)
NEUTROS PCT: 83 %
Platelets: 345 10*3/uL (ref 150–400)
RBC: 3.81 MIL/uL — ABNORMAL LOW (ref 3.87–5.11)
RDW: 15.5 % (ref 11.5–15.5)
WBC: 6.1 10*3/uL (ref 4.0–10.5)

## 2016-10-07 LAB — BASIC METABOLIC PANEL
Anion gap: 12 (ref 5–15)
BUN: 10 mg/dL (ref 6–20)
CHLORIDE: 115 mmol/L — AB (ref 101–111)
CO2: 17 mmol/L — AB (ref 22–32)
CREATININE: 1.41 mg/dL — AB (ref 0.44–1.00)
Calcium: 8.7 mg/dL — ABNORMAL LOW (ref 8.9–10.3)
GFR calc non Af Amer: 32 mL/min — ABNORMAL LOW (ref >60)
GFR, EST AFRICAN AMERICAN: 38 mL/min — AB (ref >60)
GLUCOSE: 275 mg/dL — AB (ref 65–99)
Potassium: 2.3 mmol/L — CL (ref 3.5–5.1)
Sodium: 144 mmol/L (ref 135–145)

## 2016-10-07 LAB — GLUCOSE, CAPILLARY
GLUCOSE-CAPILLARY: 265 mg/dL — AB (ref 65–99)
GLUCOSE-CAPILLARY: 277 mg/dL — AB (ref 65–99)
GLUCOSE-CAPILLARY: 203 mg/dL — AB (ref 65–99)
Glucose-Capillary: 227 mg/dL — ABNORMAL HIGH (ref 65–99)

## 2016-10-07 LAB — MAGNESIUM: MAGNESIUM: 1.6 mg/dL — AB (ref 1.7–2.4)

## 2016-10-07 NOTE — Progress Notes (Signed)
IV team unable to reestablish peripheral IV line. Patient refuses everything offered to her po including medications. Order for PICC line received.  Attempted to call daughter this am, message left for her to call back.

## 2016-10-07 NOTE — Progress Notes (Signed)
Attempted to reach daughter [name] at [phone]; no answer. LM. I also left message on 10/06/16 without return call. Palliative Medicine will continue and try to FU Romona Curls, ANP

## 2016-10-07 NOTE — Progress Notes (Signed)
Spoke with Social worker Biochemist, clinical on call to notify of need for consent of PICC and medical decisions for patient. Ethics states that physician can proceed with consenting for PICC if he deems medical necessary.

## 2016-10-07 NOTE — Progress Notes (Signed)
Patient ID: Sherry [name], female   DOB: 09-25-1927, 83 y.o.   MRN: [id number]  PROGRESS NOTE    Sherry [name]  LNL:[id number] DOB: 1927-07-06 DOA: 10/05/2016 PCP: [name], MD   Brief Narrative:  83 y.o. female with medical history significant of hypertension, diabetes, chronic kidney disease, pulmonary embolism on oral anticoagulation, hyperlipidemia, questionable dementia and recent admission and discharge on 09/19/2016 at [facility] for acute encephalopathy due to severe hypoglycemia and hematemesis with possible upper GI bleed was sent from nursing home for altered mental status. She was admitted with probable pneumonia.  Assessment & Plan:   Principal Problem:   Pneumonia Active Problems:   Chronic kidney disease (CKD), stage III (moderate)   Essential hypertension   Nausea & vomiting   Probable healthcare associated pneumonia - blood cultures negative so far. Continue cefepime. Discontinue vancomycin - Oxygen supplementation if needed - SLP evaluation and diet as per SLP recommendations - Aspiration precautions  Altered mental status with probable acute metabolic encephalopathy - Patient's baseline mental status is unknown; patient might have dementia as well - mental status is better today She is more awake. But does not want to answer any questions and hardly wants to eat anything. - Fall precautions - Will hold off on MRI for now  Nausea and vomiting - Questionable cause. Might be related to pneumonia. - CT abdomen did not show any obstruction. - Vomiting has resolved. Continue Zofran as needed. Continue Reglan for 1 more day  Chronic kidney disease stage III - follow creatinine. Continue IV fluids for now as the patient is hardly eating anything  History of pulmonary embolism - Continue anticoagulation with Eliquis  Probable chronic atrial fibrillation - Continue anticoagulation. Continue amiodarone. Slightly tachycardic  Hypertension -  monitor blood pressure. Use amlodipine if able to tolerate oral.  Diabetes mellitus type 2 with hyperglycemia - Accu-Cheks with insulin sliding scale coverage - continue Lantus  Generalized deconditioning with very poor oral intake -PT eval. - Palliative care consult for goals of care. Overall prognosis is guarded to poor - nursing staff has tried to all the daughter without any success. I have tried to do the same without success. - if condition worsens, patient should be considered for hospice/comfort measures  Hypokalemia - check BMP  Right renal mass - Being followed by urology as an outpatient? - CT abdomen still shows right renal mass. Will wait for discussion with the patient's daughter about the same before proceeding - Wait for palliative care recommendations as well   DVT prophylaxis: Eliquis Code Status:  DO NOT RESUSCITATE Family Communication: None at bedside Disposition Plan: Depends on clinical outcome  Consultants: Palliative care  Procedures: None  Antimicrobials: Cefepime and vancomycin from 10/05/2016    Subjective: Patient seen and examined at bedside. She is awake but does not answer any questions. No overnight fever. She has hardly eaten anything as per the nursing staff  Objective: Vitals:   10/06/16 0552 10/06/16 1349 10/06/16 2052 10/07/16 0544  BP: (!) 179/88 (!) 164/58 (!) 134/99 106/78  Pulse: (!) 109 (!) 101 (!) 107 (!) 102  Resp: 16 18 17 17   Temp: (!) 97.5 F (36.4 C) 98.3 F (36.8 C) 97.9 F (36.6 C) 98.9 F (37.2 C)  TempSrc: Oral Oral Axillary Oral  SpO2: 100% 99% 99% 100%  Weight:      Height:        Intake/Output Summary (Last 24 hours) at 10/07/16 1020 Last data filed at 10/07/16 4081  Gross per 24 hour  Intake              400 ml  Output              650 ml  Net             -250 ml   Filed Weights   10/05/16 2049  Weight: 48.7 kg (107 lb 4.8 oz)    Examination:  General exam: Appears Very frail and  ill. Respiratory system: Bilateral decreased breath sound at bases with scattered crackles Cardiovascular system: S1 & S2 heard, rate controlled.  Gastrointestinal system: Abdomen is nondistended, soft and no tenderness. Normal bowel sounds heard. Extremities: No cyanosis, clubbing, edema    Data Reviewed: I have personally reviewed following labs and imaging studies  CBC:  Recent Labs Lab 10/04/16 1659 10/05/16 1535 10/06/16 0448  WBC 5.1 3.6* 7.0  NEUTROABS 2.8 2.8  --   HGB 9.9* 10.9* 10.3*  HCT 29.9* 34.3* 32.5*  MCV 84.1 84.9 83.8  PLT 349 394 532   Basic Metabolic Panel:  Recent Labs Lab 10/04/16 1659 10/05/16 1535 10/06/16 0448  NA 144 140 142  K 2.7* 3.3* 4.2  CL 109 105 112*  CO2 21* 12* 16*  GLUCOSE 139* 294* 249*  BUN 15 11 10   CREATININE 1.12* 1.38* 1.20*  CALCIUM 8.4* 8.8* 8.3*   GFR: Estimated Creatinine Clearance: 25.4 mL/min (A) (by C-G formula based on SCr of 1.2 mg/dL (H)). Liver Function Tests:  Recent Labs Lab 10/04/16 1659 10/05/16 1535 10/06/16 0448  AST 13* 17 42*  ALT 10* 11* 11*  ALKPHOS 58 73 60  BILITOT 0.9 1.3* 2.8*  PROT 5.4* 6.5 5.4*  ALBUMIN 2.4* 3.0* 2.7*   No results for input(s): LIPASE, AMYLASE in the last 168 hours. No results for input(s): AMMONIA in the last 168 hours. Coagulation Profile:  Recent Labs Lab 10/04/16 1659 10/05/16 1535  INR 0.99 0.98   Cardiac Enzymes: No results for input(s): CKTOTAL, CKMB, CKMBINDEX, TROPONINI in the last 168 hours. BNP (last 3 results) No results for input(s): PROBNP in the last 8760 hours. HbA1C:  Recent Labs  10/06/16 0448  HGBA1C 5.7*   CBG:  Recent Labs Lab 10/06/16 0755 10/06/16 1231 10/06/16 1712 10/06/16 2058 10/07/16 0806  GLUCAP 280* 258* 135* 170* 265*   Lipid Profile: No results for input(s): CHOL, HDL, LDLCALC, TRIG, CHOLHDL, LDLDIRECT in the last 72 hours. Thyroid Function Tests: No results for input(s): TSH, T4TOTAL, FREET4, T3FREE, THYROIDAB  in the last 72 hours. Anemia Panel: No results for input(s): VITAMINB12, FOLATE, FERRITIN, TIBC, IRON, RETICCTPCT in the last 72 hours. Sepsis Labs:  Recent Labs Lab 10/05/16 1548  LATICACIDVEN 2.20*    Recent Results (from the past 240 hour(s))  Culture, blood (Routine x 2)     Status: None (Preliminary result)   Collection Time: 10/05/16  3:34 PM  Result Value Ref Range Status   Specimen Description BLOOD RIGHT ANTECUBITAL  Final   Special Requests IN PEDIATRIC BOTTLE Blood Culture adequate volume  Final   Culture NO GROWTH < 24 HOURS  Final   Report Status PENDING  Incomplete  Culture, blood (Routine x 2)     Status: None (Preliminary result)   Collection Time: 10/05/16  3:35 PM  Result Value Ref Range Status   Specimen Description BLOOD LEFT ANTECUBITAL  Final   Special Requests   Final    BOTTLES DRAWN AEROBIC AND ANAEROBIC Blood Culture adequate volume   Culture NO  GROWTH < 24 HOURS  Final   Report Status PENDING  Incomplete  Culture, Urine     Status: Abnormal (Preliminary result)   Collection Time: 10/05/16  6:00 PM  Result Value Ref Range Status   Specimen Description URINE, CLEAN CATCH  Final   Special Requests NONE  Final   Culture (A)  Final    60,000 COLONIES/mL GRAM NEGATIVE RODS 20,000 COLONIES/mL UNIDENTIFIED ORGANISM    Report Status PENDING  Incomplete         Radiology Studies: Ct Abdomen Pelvis Wo Contrast  Result Date: 10/06/2016 CLINICAL DATA:  83 year old female with hypoglycemia, hematemesis, altered mental status. Possible upper GI bleed. EXAM: CT ABDOMEN AND PELVIS WITHOUT CONTRAST TECHNIQUE: Multidetector CT imaging of the abdomen and pelvis was performed following the standard protocol without IV contrast. COMPARISON:  07/28/2016 CT Abdomen and Pelvis and earlier. FINDINGS: Lower chest: Trace layering right pleural effusion. Small left pleural effusion seen in July does not persist. Otherwise stable visible lung bases. Hepatobiliary: Distended  gallbladder with no pericholecystic inflammation (series 3, image 31). Negative noncontrast visualized liver. Pancreas: Diminutive, negative. Spleen: Incompletely visualized, negative. Adrenals/Urinary Tract: Normal adrenal glands. Unusual appearing density in the right renal collecting system and pelvis is more apparent than in July. , but the right renal collecting system was highly abnormal on a CT urogram study 06/22/2016 (please see that report). In the left renal collecting system there is only punctate new density which more resembles nephrolithiasis. There is no hydroureter. The urinary bladder is unremarkable. Stomach/Bowel: Decreased retained stool in the rectum since July. Decompressed sigmoid colon and distal descending colon. Similar retained stool at the splenic flexure. Redundant transverse colon is mostly decompressed. Decompressed hepatic flexure and right colon. Decompressed terminal ileum and small bowel throughout the abdomen. Partially visible chronic gastric hiatal hernia. The intraabdominal portion of the stomach and the duodenum appear unremarkable. No abdominal free fluid or free air. Vascular/Lymphatic: Aortoiliac calcified atherosclerosis. Vascular patency is not evaluated in the absence of IV contrast. No lymphadenopathy. Reproductive: Negative. Other: No pelvic free fluid. Musculoskeletal: Stable.  Chronic L3 compression fracture. IMPRESSION: 1. No bowel obstruction or bowel inflammation. Partially visible gastric hiatal hernia, but normal noncontrast appearance of the visualized portion of the stomach and duodenum. 2. Distended gallbladder but no CT evidence of acute cholecystitis. 3. No evidence of acute obstructive uropathy, but chronically abnormal right renal collecting system. As described on prior studies the differential considerations include transitional cell carcinoma (perhaps treated/calcified), chronic fungal infection, chronic blood clots. 4. Small right pleural effusion  has decreased. Interval resolved small left pleural effusion. 5.  Aortic Atherosclerosis (ICD10-I70.0). Electronically Signed   By: Genevie Ann M.D.   On: 10/06/2016 16:23   Dg Abd 1 View  Result Date: 10/05/2016 CLINICAL DATA:  Abdominal pain with nausea EXAM: ABDOMEN - 1 VIEW COMPARISON:  08/01/2016 FINDINGS: Visualized lung bases are clear. Mild scoliosis of the spine. Nonspecific diffuse decreased bowel gas. Mild formed stool in the rectum. Small calcified phleboliths in the pelvis. IMPRESSION: Nonspecific diffuse decreased bowel gas. Mild formed stool in the rectum. Electronically Signed   By: Donavan Foil M.D.   On: 10/05/2016 21:20   Ct Head Wo Contrast  Result Date: 10/05/2016 CLINICAL DATA:  Altered level of consciousness. EXAM: CT HEAD WITHOUT CONTRAST TECHNIQUE: Contiguous axial images were obtained from the base of the skull through the vertex without intravenous contrast. COMPARISON:  CT scan of September 05, 2016. FINDINGS: Brain: Mild diffuse cortical atrophy is noted. Mild chronic  ischemic white matter disease is noted. No mass effect or midline shift is noted. Ventricular size is within normal limits. There is no evidence of mass lesion, hemorrhage or acute infarction. Vascular: No hyperdense vessel or unexpected calcification. Skull: Normal. Negative for fracture or focal lesion. Sinuses/Orbits: No acute finding. Other: None. IMPRESSION: Mild diffuse cortical atrophy. Mild chronic ischemic white matter disease. No acute intracranial abnormality seen. Electronically Signed   By: Marijo Conception, M.D.   On: 10/05/2016 17:10   Dg Chest Port 1 View  Result Date: 10/05/2016 CLINICAL DATA:  Altered mental status EXAM: PORTABLE CHEST 1 VIEW COMPARISON:  09/15/2016 FINDINGS: Normal removal of right PICC line. Airspace opacity noted in the right infrahilar region. Possible small right effusion. Left lung is clear. Heart is normal size. No acute bony abnormality. IMPRESSION: Right infrahilar airspace  opacity, atelectasis versus pneumonia. Small right effusion. Electronically Signed   By: Rolm Baptise M.D.   On: 10/05/2016 15:12        Scheduled Meds: . amiodarone  200 mg Oral Daily  . amLODipine  10 mg Oral Daily  . apixaban  2.5 mg Oral BID  . atorvastatin  20 mg Oral q1800  . docusate sodium  100 mg Oral BID  . gabapentin  300 mg Oral QHS  . insulin aspart  0-9 Units Subcutaneous TID WC  . insulin glargine  4 Units Subcutaneous QHS  . metoCLOPramide (REGLAN) injection  5 mg Intravenous Q8H  . pantoprazole  40 mg Oral BID  . polyethylene glycol  17 g Oral Daily  . potassium chloride  40 mEq Oral BID  . sucralfate  1 g Oral TID WC & HS  . tamsulosin  0.4 mg Oral QPC breakfast   Continuous Infusions: . sodium chloride 50 mL/hr at 10/07/16 0513  . ceFEPime (MAXIPIME) IV Stopped (10/06/16 1754)  . vancomycin Stopped (10/06/16 1918)     LOS: 2 days        Aline August, MD [facility] Pager [phone]  If 7PM-7AM, please contact night-coverage [email or web address] Password [email or web address] 10/07/2016, 10:20 AM

## 2016-10-07 NOTE — Evaluation (Signed)
Physical Therapy Evaluation Patient Details Name: Sherry [name] MRN: [id number] DOB: [birth year] Today's Date: 10/07/2016   History of Present Illness  Pt admitted from SNF where she was receiving rehab with PNA. Recent admission ot [facility] with severe hypoglycemia, hemataemesis. PMH: HTN, DM, CKD, PE, ?dementia.   Clinical Impression  Pt admitted with above diagnosis. Pt currently with functional limitations due to the deficits listed below (see PT Problem List). Pt with little to no participation in eval. She appears very ill. Several attempts to put her finger in throat to elicit vomiting. Max assist required for sidelying <> sit transfer. Pt declining any mobility beyond EOB. No verbalizations during eval. She would only nod her head yes or no. She has poor strength. RN reports pt has been unwilling to eat. Pt will benefit from skilled PT to increase their independence and safety with mobility to allow discharge to the venue listed below.  PT will follow pt on a trial basis. If med status improves and pt begins eating, PT will proceed with Rx.     Follow Up Recommendations SNF    Equipment Recommendations  None recommended by PT    Recommendations for Other Services       Precautions / Restrictions Precautions Precautions: Fall      Mobility  Bed Mobility       Sidelying to sit: Max assist     Sit to sidelying: Max assist General bed mobility comments: Pt assisted to EOB. Immediately upon sitting, pt pushing in an attempt to lie back down. Pt sat approx 30 seconds then returned to sidelying in bed.   Transfers                 General transfer comment: pt declined, returning to side lying after less than at minute at EOB  Ambulation/Gait             General Gait Details: unable  Stairs            Wheelchair Mobility    Modified Rankin (Stroke Patients Only)       Balance   Sitting-balance support: Feet supported;No upper extremity  supported Sitting balance-Leahy Scale: Fair                                       Pertinent Vitals/Pain Pain Assessment: Faces Faces Pain Scale: No hurt    Home Living Family/patient expects to be discharged to:: Skilled nursing facility Living Arrangements: Children (son) Available Help at Discharge: Family;Available PRN/intermittently (son works) Type of Home: House Home Access: Level entry     [place]: One [facility] bed;Shower seat;Wheelchair - manual;Bedside commode;Walker - 4 wheels;Grab bars - tub/shower Additional Comments: information taken from previous admission    Prior Function Level of Independence: Needs assistance   Gait / Transfers Assistance Needed: Ambulates with 4ww to bathroom but also uses manual w/c in home as well (pushes self in w/c unless son is around).  ADL's / Homemaking Assistance Needed: Daughter assists with medications and food per pt report; son assists with meals and cleaning/housework per pt report; pt reports performing own sponge baths.        Hand Dominance   Dominant Hand: Right    Extremity/Trunk Assessment   Upper Extremity Assessment Upper Extremity Assessment: Defer to OT evaluation    Lower Extremity Assessment Lower Extremity Assessment: Generalized weakness  Communication   Communication: HOH  Cognition Arousal/Alertness: Lethargic Behavior During Therapy: Flat affect Overall Cognitive Status: Difficult to assess                                 General Comments: Attempting to put her finger down her throat to induce vomitting.      General Comments      Exercises     Assessment/Plan    PT Assessment Patient needs continued PT services  PT Problem List Decreased strength;Decreased activity tolerance;Decreased balance;Decreased cognition;Decreased mobility       PT Treatment Interventions Therapeutic activities;Functional mobility training;Balance  training;Patient/family education;Therapeutic exercise;Gait training;Cognitive remediation    PT Goals (Current goals can be found in the Care Plan section)  Acute Rehab PT Goals Patient Stated Goal: not stated PT Goal Formulation: Patient unable to participate in goal setting Time For Goal Achievement: 10/21/16 Potential to Achieve Goals: Fair    Frequency Min 2X/week   Barriers to discharge        Co-evaluation               AM-PAC PT "6 Clicks" Daily Activity  Outcome Measure Difficulty turning over in bed (including adjusting bedclothes, sheets and blankets)?: A Little Difficulty moving from lying on back to sitting on the side of the bed? : Unable Difficulty sitting down on and standing up from a chair with arms (e.g., wheelchair, bedside commode, etc,.)?: Unable Help needed moving to and from a bed to chair (including a wheelchair)?: Total Help needed walking in hospital room?: Total Help needed climbing 3-5 steps with a railing? : Total 6 Click Score: 8    End of Session   Activity Tolerance: Patient limited by fatigue Patient left: in bed;with bed alarm set;with nursing/sitter in room;with call bell/phone within reach Nurse Communication: Mobility status PT Visit Diagnosis: Other abnormalities of gait and mobility (R26.89);Muscle weakness (generalized) (M62.81)    Time: 4696-2952 PT Time Calculation (min) (ACUTE ONLY): 11 min   Charges:   PT Evaluation $PT Eval Low Complexity: 1 Low     PT G Codes:        Lorrin Goodell, PT  Office # [phone] Pager [phone]   Lorriane Shire 10/07/2016, 9:06 AM

## 2016-10-07 NOTE — Progress Notes (Signed)
Notified Dr Mont Dutton regarding potassium 2.3 critical value. He states unable to treat potassium due to pt refusing to eat or drink and without IV due to unable to access vein by IV team and can not get consent for PICC. I will notify social work and try to reach contact information for family.

## 2016-10-07 NOTE — Progress Notes (Signed)
Spoke with DR Starla Link regarding medical necessity of PICC to treat low pottassium to be placed without consent of daughter. He states it is not medical necessary to place PICC and treat low potassium at this time

## 2016-10-07 NOTE — Progress Notes (Signed)
Spoke with Hinton Dyer  RN re PICC placement once MD gives medical necessity OR family gives consent.

## 2016-10-07 NOTE — Progress Notes (Signed)
Left VMS on home and cell numbers given for the daughter [name] to call us regarding consent for PICC and update patient care plan with her.

## 2016-10-07 NOTE — Progress Notes (Signed)
Patient's IV was leaking and pulled out, attempted IV restart x2 by RN and x2 by IV RN but unsuccessful due to BUE edema. Md on call notified.

## 2016-10-07 NOTE — Progress Notes (Signed)
Pt refused all oral  meds at this time. Safety ensured.

## 2016-10-08 LAB — BASIC METABOLIC PANEL
Anion gap: 13 (ref 5–15)
BUN: 8 mg/dL (ref 6–20)
CALCIUM: 8.8 mg/dL — AB (ref 8.9–10.3)
CHLORIDE: 113 mmol/L — AB (ref 101–111)
CO2: 19 mmol/L — AB (ref 22–32)
CREATININE: 1.25 mg/dL — AB (ref 0.44–1.00)
GFR calc Af Amer: 44 mL/min — ABNORMAL LOW (ref >60)
GFR calc non Af Amer: 38 mL/min — ABNORMAL LOW (ref >60)
GLUCOSE: 267 mg/dL — AB (ref 65–99)
Potassium: 2.3 mmol/L — CL (ref 3.5–5.1)
Sodium: 145 mmol/L (ref 135–145)

## 2016-10-08 LAB — GLUCOSE, CAPILLARY
GLUCOSE-CAPILLARY: 237 mg/dL — AB (ref 65–99)
GLUCOSE-CAPILLARY: 153 mg/dL — AB (ref 65–99)
Glucose-Capillary: 192 mg/dL — ABNORMAL HIGH (ref 65–99)
Glucose-Capillary: 139 mg/dL — ABNORMAL HIGH (ref 65–99)

## 2016-10-08 LAB — CBC WITH DIFFERENTIAL/PLATELET
Basophils Absolute: 0 10*3/uL (ref 0.0–0.1)
Basophils Relative: 0 %
EOS ABS: 0 10*3/uL (ref 0.0–0.7)
EOS PCT: 0 %
HCT: 34.7 % — ABNORMAL LOW (ref 36.0–46.0)
HEMOGLOBIN: 11.3 g/dL — AB (ref 12.0–15.0)
LYMPHS ABS: 1 10*3/uL (ref 0.7–4.0)
LYMPHS PCT: 16 %
MCH: 26.8 pg (ref 26.0–34.0)
MCHC: 32.6 g/dL (ref 30.0–36.0)
MCV: 82.4 fL (ref 78.0–100.0)
MONOS PCT: 5 %
Monocytes Absolute: 0.3 10*3/uL (ref 0.1–1.0)
Neutro Abs: 4.9 10*3/uL (ref 1.7–7.7)
Neutrophils Relative %: 79 %
Platelets: 313 10*3/uL (ref 150–400)
RBC: 4.21 MIL/uL (ref 3.87–5.11)
RDW: 15.4 % (ref 11.5–15.5)
WBC: 6.2 10*3/uL (ref 4.0–10.5)

## 2016-10-08 LAB — URINE CULTURE: Culture: 60000 — AB

## 2016-10-08 LAB — MAGNESIUM: Magnesium: 1.6 mg/dL — ABNORMAL LOW (ref 1.7–2.4)

## 2016-10-08 LAB — LEGIONELLA PNEUMOPHILA SEROGP 1 UR AG: L. pneumophila Serogp 1 Ur Ag: NEGATIVE

## 2016-10-08 MED ORDER — AMOXICILLIN-POT CLAVULANATE 500-125 MG PO TABS
1.0000 | ORAL_TABLET | Freq: Two times a day (BID) | ORAL | Status: DC
  Administered 2016-10-10 – 2016-10-13 (×4): 500 mg via ORAL
  Filled 2016-10-08 (×11): qty 1

## 2016-10-08 NOTE — Progress Notes (Signed)
Spoke with Dr Starla Link prior to attempting to obtain IV access.  Due to unable to contact family, orders given to place any access other than PICC including midline if able. Attempted Powerglide Midline x 2 PICC RN's.  Veins noncompressable.  Unable to locate cephalic vein BUE, other vessels too small.  Assessed Bil forearms for PIV placement, unable to locate suitable vein.  2 IVT RN's unable to locate any other PIV, Midline or PICC placement options via [place]. Dr Starla Link paged post attempt to notify of the above. Please refer PICC to IR.

## 2016-10-08 NOTE — Clinical Social Work Note (Signed)
Patient is a short-term rehab resident at [facility]. CSW left voicemails for patient's daughter on both available numbers. Will discuss return to SNF when she calls back.  Dayton Scrape, Mabel

## 2016-10-08 NOTE — Progress Notes (Signed)
Patient ID: Sherry [name], female   DOB: [birth year], 83 y.o.   MRN: [id number]  PROGRESS NOTE    [name]  SHF:[id number] DOB: 1927/02/22 DOA: 10/05/2016 PCP: [name], MD   Brief Narrative:  83 y.o. female with medical history significant of hypertension, diabetes, chronic kidney disease, pulmonary embolism on oral anticoagulation, hyperlipidemia, questionable dementia and recent admission and discharge on 09/19/2016 at [facility] for acute encephalopathy due to severe hypoglycemia and hematemesis with possible upper GI bleed was sent from nursing home for altered mental status. She was admitted with probable pneumonia. Her overall condition is not improving. Palliative care consultation was called.  Assessment & Plan:   Principal Problem:   Pneumonia Active Problems:   Chronic kidney disease (CKD), stage III (moderate)   Essential hypertension   Nausea & vomiting   Probable healthcare associated pneumonia - blood cultures negative so far. Currently cefepime is ordered but patient is not getting it as there is no IV access despite multiple attempts. We will discuss with palliative care team and might need to involve ethics committee to discuss next step in management as patient's family member/daughter has not been able to be reached despite multiple attempts to call her by previous individuals. Urine streptococcal antigen was positive. Patient can be switched to oral Augmentin if she agrees. Repeat chest x-ray for tomorrow - Oxygen supplementation if needed - SLP evaluation and diet as per SLP recommendations. Patient is not eating anything - Aspiration precautions  Probable UTI - Urine culture is growing enterococcus and Proteus. We'll switch to oral Augmentin  Altered mental status with probable acute metabolic encephalopathy - Patient's baseline mental status is unknown; patient might have dementia as well - She is awake intermittently but does not answer  any questions - Fall precautions  Nausea and vomiting - Questionable cause. Might be related to pneumonia. - CT abdomen did not show any obstruction. - Vomiting has resolved.   Chronic kidney disease stage III - follow creatinine. Creatinine stable  History of pulmonary embolism - Continue anticoagulation with Eliquis if starts to take pills by mouth  Probable chronic atrial fibrillation - Continue anticoagulation. Continue amiodarone. Slightly tachycardic  Hypertension - monitor blood pressure. Use amlodipine if able to tolerate oral.  Diabetes mellitus type 2 with hyperglycemia - Accu-Cheks with insulin sliding scale coverage - continue Lantus  Generalized deconditioning with very poor oral intake -PT eval. - Palliative care consult for goals of care. Overall prognosis is guarded to poor - nursing staff has tried to call the daughter multiple times without any success. I have tried to do the same without success.  - PICC team has failed to obtain any IV access despite multiple attempts. Patient is not eating anything, she should be a candidate for hospice/comfort measures. Follow up with palliative care  Hypokalemia - Still hypokalemic. Patient refuses to take any oral potassium supplement.  Hypomagnesemia  Right renal mass - Being followed by urology as an outpatient? - CT abdomen still shows right renal mass. Will wait for discussion with the patient's daughter about the same before proceeding - Wait for palliative care recommendations as well   DVT prophylaxis: Eliquis Code Status:  DO NOT RESUSCITATE Family Communication: None at bedside Disposition Plan: Depends on clinical outcome  Consultants: Palliative care  Procedures: None  Antimicrobials: Cefepime on 10/05/2016 and 10/06/2016 Vancomycin from 10/05/2016-10/06/2016  Subjective: Patient seen and examined at bedside. She is sleepy, wakes up slightly but does not answer any questions  No  overnight fever. She has not eaten anything and refuses oral medications as per the nursing staff  Objective: Vitals:   10/07/16 0544 10/07/16 1430 10/07/16 2053 10/08/16 0540  BP: 106/78 104/60 106/71 (!) 152/96  Pulse: (!) 102 (!) 106 (!) 101 (!) 103  Resp: 17 17 17 17   Temp: 98.9 F (37.2 C) 98.5 F (36.9 C) 98.5 F (36.9 C) 98.3 F (36.8 C)  TempSrc: Oral Oral Axillary Axillary  SpO2: 100% 100% 99% 97%  Weight:      Height:        Intake/Output Summary (Last 24 hours) at 10/08/16 1407 Last data filed at 10/08/16 0540  Gross per 24 hour  Intake                0 ml  Output              500 ml  Net             -500 ml   Filed Weights   10/05/16 2049  Weight: 48.7 kg (107 lb 4.8 oz)    Examination:  General exam: Appears Very frail and ill. Respiratory system: Bilateral decreased breath sound at bases with scattered crackles Cardiovascular system: S1 & S2 heard, rate controlled.  Gastrointestinal system: Abdomen is nondistended, soft and no tenderness. Normal bowel sounds heard. Extremities: No cyanosis, clubbing, edema    Data Reviewed: I have personally reviewed following labs and imaging studies  CBC:  Recent Labs Lab 10/04/16 1659 10/05/16 1535 10/06/16 0448 10/07/16 1411 10/08/16 0929  WBC 5.1 3.6* 7.0 6.1 6.2  NEUTROABS 2.8 2.8  --  5.1 4.9  HGB 9.9* 10.9* 10.3* 10.4* 11.3*  HCT 29.9* 34.3* 32.5* 31.3* 34.7*  MCV 84.1 84.9 83.8 82.2 82.4  PLT 349 394 394 345 277   Basic Metabolic Panel:  Recent Labs Lab 10/04/16 1659 10/05/16 1535 10/06/16 0448 10/07/16 1411 10/08/16 0654  NA 144 140 142 144 145  K 2.7* 3.3* 4.2 2.3* 2.3*  CL 109 105 112* 115* 113*  CO2 21* 12* 16* 17* 19*  GLUCOSE 139* 294* 249* 275* 267*  BUN 15 11 10 10 8   CREATININE 1.12* 1.38* 1.20* 1.41* 1.25*  CALCIUM 8.4* 8.8* 8.3* 8.7* 8.8*  MG  --   --   --  1.6* 1.6*   GFR: Estimated Creatinine Clearance: 24.4 mL/min (A) (by C-G formula based on SCr of 1.25 mg/dL  (H)). Liver Function Tests:  Recent Labs Lab 10/04/16 1659 10/05/16 1535 10/06/16 0448  AST 13* 17 42*  ALT 10* 11* 11*  ALKPHOS 58 73 60  BILITOT 0.9 1.3* 2.8*  PROT 5.4* 6.5 5.4*  ALBUMIN 2.4* 3.0* 2.7*   No results for input(s): LIPASE, AMYLASE in the last 168 hours. No results for input(s): AMMONIA in the last 168 hours. Coagulation Profile:  Recent Labs Lab 10/04/16 1659 10/05/16 1535  INR 0.99 0.98   Cardiac Enzymes: No results for input(s): CKTOTAL, CKMB, CKMBINDEX, TROPONINI in the last 168 hours. BNP (last 3 results) No results for input(s): PROBNP in the last 8760 hours. HbA1C:  Recent Labs  10/06/16 0448  HGBA1C 5.7*   CBG:  Recent Labs Lab 10/07/16 1227 10/07/16 1653 10/07/16 2050 10/08/16 0813 10/08/16 1258  GLUCAP 277* 203* 227* 237* 192*   Lipid Profile: No results for input(s): CHOL, HDL, LDLCALC, TRIG, CHOLHDL, LDLDIRECT in the last 72 hours. Thyroid Function Tests: No results for input(s): TSH, T4TOTAL, FREET4, T3FREE, THYROIDAB in the last 72 hours. Anemia  Panel: No results for input(s): VITAMINB12, FOLATE, FERRITIN, TIBC, IRON, RETICCTPCT in the last 72 hours. Sepsis Labs:  Recent Labs Lab 10/05/16 1548  LATICACIDVEN 2.20*    Recent Results (from the past 240 hour(s))  Culture, blood (Routine x 2)     Status: None (Preliminary result)   Collection Time: 10/05/16  3:34 PM  Result Value Ref Range Status   Specimen Description BLOOD RIGHT ANTECUBITAL  Final   Special Requests IN PEDIATRIC BOTTLE Blood Culture adequate volume  Final   Culture NO GROWTH 2 DAYS  Final   Report Status PENDING  Incomplete  Culture, blood (Routine x 2)     Status: None (Preliminary result)   Collection Time: 10/05/16  3:35 PM  Result Value Ref Range Status   Specimen Description BLOOD LEFT ANTECUBITAL  Final   Special Requests   Final    BOTTLES DRAWN AEROBIC AND ANAEROBIC Blood Culture adequate volume   Culture NO GROWTH 2 DAYS  Final   Report  Status PENDING  Incomplete  Culture, Urine     Status: Abnormal   Collection Time: 10/05/16  6:00 PM  Result Value Ref Range Status   Specimen Description URINE, CLEAN CATCH  Final   Special Requests NONE  Final   Culture (A)  Final    60,000 COLONIES/mL PROTEUS MIRABILIS 20,000 COLONIES/mL ENTEROCOCCUS FAECALIS    Report Status 10/08/2016 FINAL  Final   Organism ID, Bacteria PROTEUS MIRABILIS (A)  Final   Organism ID, Bacteria ENTEROCOCCUS FAECALIS (A)  Final      Susceptibility   Enterococcus faecalis - MIC*    AMPICILLIN <=2 SENSITIVE Sensitive     LEVOFLOXACIN 1 SENSITIVE Sensitive     NITROFURANTOIN <=16 SENSITIVE Sensitive     VANCOMYCIN 1 SENSITIVE Sensitive     * 20,000 COLONIES/mL ENTEROCOCCUS FAECALIS   Proteus mirabilis - MIC*    AMPICILLIN <=2 SENSITIVE Sensitive     CEFAZOLIN <=4 SENSITIVE Sensitive     CEFTRIAXONE <=1 SENSITIVE Sensitive     CIPROFLOXACIN >=4 RESISTANT Resistant     GENTAMICIN 8 INTERMEDIATE Intermediate     IMIPENEM 4 SENSITIVE Sensitive     NITROFURANTOIN 128 RESISTANT Resistant     TRIMETH/SULFA >=320 RESISTANT Resistant     AMPICILLIN/SULBACTAM <=2 SENSITIVE Sensitive     PIP/TAZO <=4 SENSITIVE Sensitive     * 60,000 COLONIES/mL PROTEUS MIRABILIS         Radiology Studies: Ct Abdomen Pelvis Wo Contrast  Result Date: 10/06/2016 CLINICAL DATA:  83 year old female with hypoglycemia, hematemesis, altered mental status. Possible upper GI bleed. EXAM: CT ABDOMEN AND PELVIS WITHOUT CONTRAST TECHNIQUE: Multidetector CT imaging of the abdomen and pelvis was performed following the standard protocol without IV contrast. COMPARISON:  07/28/2016 CT Abdomen and Pelvis and earlier. FINDINGS: Lower chest: Trace layering right pleural effusion. Small left pleural effusion seen in July does not persist. Otherwise stable visible lung bases. Hepatobiliary: Distended gallbladder with no pericholecystic inflammation (series 3, image 31). Negative noncontrast  visualized liver. Pancreas: Diminutive, negative. Spleen: Incompletely visualized, negative. Adrenals/Urinary Tract: Normal adrenal glands. Unusual appearing density in the right renal collecting system and pelvis is more apparent than in July. , but the right renal collecting system was highly abnormal on a CT urogram study 06/22/2016 (please see that report). In the left renal collecting system there is only punctate new density which more resembles nephrolithiasis. There is no hydroureter. The urinary bladder is unremarkable. Stomach/Bowel: Decreased retained stool in the rectum since July. Decompressed sigmoid colon  and distal descending colon. Similar retained stool at the splenic flexure. Redundant transverse colon is mostly decompressed. Decompressed hepatic flexure and right colon. Decompressed terminal ileum and small bowel throughout the abdomen. Partially visible chronic gastric hiatal hernia. The intraabdominal portion of the stomach and the duodenum appear unremarkable. No abdominal free fluid or free air. Vascular/Lymphatic: Aortoiliac calcified atherosclerosis. Vascular patency is not evaluated in the absence of IV contrast. No lymphadenopathy. Reproductive: Negative. Other: No pelvic free fluid. Musculoskeletal: Stable.  Chronic L3 compression fracture. IMPRESSION: 1. No bowel obstruction or bowel inflammation. Partially visible gastric hiatal hernia, but normal noncontrast appearance of the visualized portion of the stomach and duodenum. 2. Distended gallbladder but no CT evidence of acute cholecystitis. 3. No evidence of acute obstructive uropathy, but chronically abnormal right renal collecting system. As described on prior studies the differential considerations include transitional cell carcinoma (perhaps treated/calcified), chronic fungal infection, chronic blood clots. 4. Small right pleural effusion has decreased. Interval resolved small left pleural effusion. 5.  Aortic Atherosclerosis  (ICD10-I70.0). Electronically Signed   By: Genevie Ann M.D.   On: 10/06/2016 16:23        Scheduled Meds: . amiodarone  200 mg Oral Daily  . amLODipine  10 mg Oral Daily  . apixaban  2.5 mg Oral BID  . atorvastatin  20 mg Oral q1800  . docusate sodium  100 mg Oral BID  . gabapentin  300 mg Oral QHS  . insulin aspart  0-9 Units Subcutaneous TID WC  . insulin glargine  4 Units Subcutaneous QHS  . metoCLOPramide (REGLAN) injection  5 mg Intravenous Q8H  . pantoprazole  40 mg Oral BID  . polyethylene glycol  17 g Oral Daily  . potassium chloride  40 mEq Oral BID  . sucralfate  1 g Oral TID WC & HS  . tamsulosin  0.4 mg Oral QPC breakfast   Continuous Infusions: . sodium chloride 50 mL/hr at 10/07/16 0513  . ceFEPime (MAXIPIME) IV Stopped (10/06/16 1754)     LOS: 3 days        Aline August, MD [facility] Pager [phone]  If 7PM-7AM, please contact night-coverage [email or web address] Password [email or web address] 10/08/2016, 2:07 PM

## 2016-10-08 NOTE — Progress Notes (Signed)
Patient intentionally gaging herself with her fingers. Thick white mucus suctioned out of patients mouth.

## 2016-10-08 NOTE — Progress Notes (Signed)
Dr. Starla Link paged and made aware of patient's critical potassium 2.3 and that patient is still refusing all medication, including potassium. Awaiting response.

## 2016-10-08 NOTE — Progress Notes (Signed)
Patient refused to eat anything or open her mouth for medication. MD made aware, he states he already knows this.

## 2016-10-09 LAB — GLUCOSE, CAPILLARY
GLUCOSE-CAPILLARY: 165 mg/dL — AB (ref 65–99)
GLUCOSE-CAPILLARY: 139 mg/dL — AB (ref 65–99)
Glucose-Capillary: 123 mg/dL — ABNORMAL HIGH (ref 65–99)
Glucose-Capillary: 120 mg/dL — ABNORMAL HIGH (ref 65–99)

## 2016-10-09 NOTE — Progress Notes (Signed)
  Speech Language Pathology Treatment: Dysphagia  Patient Details Name: CARLENE BICKLEY MRN: [id number] DOB: 07-10-1927 Today's Date: 10/09/2016 Time: 1340-1350 SLP Time Calculation (min) (ACUTE ONLY): 10 min  Assessment / Plan / Recommendation Clinical Impression  Pt demonstrates ability to swallow with resulting discomfort that appears to be nausea/gagging. Pt nods yes when asked if she is nauseated. She accepted two bites of ice cream and several sips of water with no signs of aspiration. Pts poor PO intake does not appear dysphagia related but more of a general medical issue. Would allow upgraded diet to offer a greater variety of foods, though agree that intake likely to be minimal regardless of texture. Will sign off at this time as there is no further need for SLP intervention.   HPI HPI: Rashawna Scoles Pooleis a 83 y.o.femalewith medical history of hypertension, diabetes, chronic kidney disease, pulmonary embolism on oral anticoagulation, hyperlipidemia, questionable dementia and recent admission and discharge on 09/19/2016 at [facility] for acute encephalopathy due to severe hypoglycemia and hematemesis with possible upper GI bleed.  Now sent to hospital from nursing home for altered mental status.        SLP Plan  Discharge SLP treatment due to (comment)       Recommendations  Diet recommendations: Dysphagia 1 (puree);Thin liquid Medication Administration: Crushed with puree Supervision: Full supervision/cueing for compensatory strategies                Oral Care Recommendations: Oral care BID SLP Visit Diagnosis: Dysphagia, unspecified (R13.10) Plan: Discharge SLP treatment due to (comment)       GO               Herbie Baltimore, MA CCC-SLP [phone]  Mavric Cortright, Katherene Ponto 10/09/2016, 1:53 PM

## 2016-10-09 NOTE — Plan of Care (Signed)
Problem: Education: Goal: Knowledge of [facility] Education information/materials will improve Outcome: Not Progressing Patient disoriented x3

## 2016-10-09 NOTE — Care Management Important Message (Signed)
Important Message  Patient Details  Name: Sherry [name] MRN: [id number] Date of Birth: Jun 26, 1927   Medicare Important Message Given:  Yes    Ysabel Cowgill 10/09/2016, 2:01 PM

## 2016-10-09 NOTE — Progress Notes (Signed)
Occupational Therapy Treatment Patient Details Name: Sherry [name] MRN: [id number] DOB: [birth year] Today's Date: 10/09/2016    History of present illness Pt admitted from SNF where she was receiving rehab with PNA. Recent admission ot [facility] with severe hypoglycemia, hemataemesis. PMH: HTN, DM, CKD, PE, ?dementia.    OT comments  Pt with no participation in therapy today. Pt did wake to voice and touch and kept eyes open through session but followed no commands and did not attempt to assist with any mobility or adls for entirety of session.  Will attempt one more therapy session and may need to sign off.    Follow Up Recommendations  SNF;Supervision/Assistance - 24 hour    Equipment Recommendations       Recommendations for Other Services      Precautions / Restrictions Precautions Precautions: Fall Restrictions Weight Bearing Restrictions: No       Mobility Bed Mobility Overal bed mobility: Needs Assistance Bed Mobility: Rolling;Sidelying to Sit;Sit to Supine Rolling: Total assist Sidelying to sit: Total assist Supine to sit: Total assist Sit to supine: Total assist Sit to sidelying: Total assist General bed mobility comments: Pt did not assist wit any movements today  Transfers Overall transfer level: Needs assistance   Transfers: Sit to/from Stand Sit to Stand: Total assist         General transfer comment: Pt did not participate or attempt to assist in any mobility today.    Balance Overall balance assessment: Needs assistance Sitting-balance support: Feet supported;No upper extremity supported Sitting balance-Leahy Scale: Zero       Standing balance-Leahy Scale: Zero Standing balance comment: unable to stand today                           ADL either performed or assessed with clinical judgement   ADL Overall ADL's : Needs assistance/impaired Eating/Feeding: Total assistance   Grooming: Total assistance   Upper Body Bathing: Total  assistance   Lower Body Bathing: Total assistance   Upper Body Dressing : Total assistance   Lower Body Dressing: Total assistance   Toilet Transfer: Total assistance           Functional mobility during ADLs: Total assistance General ADL Comments: Pt with no participation in session today     Vision       Perception     Praxis      Cognition Arousal/Alertness: Lethargic Behavior During Therapy: Flat affect Overall Cognitive Status: Difficult to assess                                 General Comments: Pt not participating at all today with therapy. Pt did open eyes to touch in beginning of session. Eyes remained open through session but pt did not focus on therapist, follow any commands, or attempt any movement.         Exercises     Shoulder Instructions       General Comments Pt continues to have low to no participation in therapy now.  Will attempt to see one more session and will d/c if needed.    Pertinent Vitals/ Pain       Pain Assessment: Faces Pain Score: 0-No pain  Home Living  Prior Functioning/Environment              Frequency  Min 2X/week        Progress Toward Goals  OT Goals(current goals can now be found in the care plan section)  Progress towards OT goals: Not progressing toward goals - comment (no participation)  Acute Rehab OT Goals Patient Stated Goal: not stated OT Goal Formulation: Patient unable to participate in goal setting Time For Goal Achievement: 10/20/16 Potential to Achieve Goals: Fair ADL Goals Pt Will Perform Grooming: with min assist;sitting Pt Will Perform Upper Body Dressing: with min assist;sitting Pt Will Perform Lower Body Dressing: with min assist;sit to/from stand Pt Will Transfer to Toilet: with min assist;ambulating;bedside commode Pt Will Perform Toileting - Clothing Manipulation and hygiene: with min assist;sit to/from  stand Additional ADL Goal #1: Pt will perform bed mobility with min guard assist. Additional ADL Goal #2: Pt will respond to time and place accurately using environmental cues.  Plan Discharge plan remains appropriate    Co-evaluation                 AM-PAC PT "6 Clicks" Daily Activity     Outcome Measure   Help from another person eating meals?: Total Help from another person taking care of personal grooming?: Total Help from another person toileting, which includes using toliet, bedpan, or urinal?: Total Help from another person bathing (including washing, rinsing, drying)?: Total Help from another person to put on and taking off regular upper body clothing?: Total Help from another person to put on and taking off regular lower body clothing?: Total 6 Click Score: 6    End of Session    OT Visit Diagnosis: Muscle weakness (generalized) (M62.81);Other symptoms and signs involving cognitive function   Activity Tolerance Patient limited by lethargy   Patient Left in bed;with call bell/phone within reach   Nurse Communication Mobility status        Time: 1110-1120 OT Time Calculation (min): 10 min  Charges: OT General Charges $OT Visit: 1 Visit OT Treatments $Therapeutic Activity: 8-22 mins  Jinger Neighbors, OTR/L   Glenford Peers 10/09/2016, 11:30 AM

## 2016-10-09 NOTE — Progress Notes (Signed)
Patient ID: Sherry [name], female   DOB: 1927-09-17, 83 y.o.   MRN: [id number]  PROGRESS NOTE    KYMBERLEE VIGER  DZH:[id number] DOB: [birth year] DOA: 10/05/2016 PCP: [name], MD   Brief Narrative:  83 y.o. female with medical history significant of hypertension, diabetes, chronic kidney disease, pulmonary embolism on oral anticoagulation, hyperlipidemia, questionable dementia and recent admission and discharge on 09/19/2016 at [facility] for acute encephalopathy due to severe hypoglycemia and hematemesis with possible upper GI bleed was sent from nursing home for altered mental status. She was admitted with probable pneumonia. Her overall condition is not improving. Palliative care consultation was called.  Assessment & Plan:   Principal Problem:   Pneumonia Active Problems:   Chronic kidney disease (CKD), stage III (moderate) (HCC)   Essential hypertension   Nausea & vomiting   Probable healthcare associated pneumonia - blood cultures negative so far. Respiratory status stable. Continue Augmentin if patient agreeable - Urine streptococcal antigen was positive.  Repeat chest x-ray for tomorrow - Oxygen supplementation if needed - SLP evaluation and diet as per SLP recommendations. Patient is hardly eating anything and spits out most of the medications. - Aspiration precautions  Probable UTI - Urine culture is growing enterococcus and Proteus. Antibiotic plan as above  Altered mental status with probable acute metabolic encephalopathy - Patient's baseline mental status is unknown; patient might have dementia as well - She is awake intermittently but does not answer any questions. She has been refusing to engage in any kind of conversation - Fall precautions  Nausea and vomiting - Questionable cause. Might be related to pneumonia. - CT abdomen did not show any obstruction. - Vomiting has resolved.   Chronic kidney disease stage III - follow  creatinine.  History of pulmonary embolism - Continue anticoagulation with Eliquis if starts to take pills by mouth  Probable chronic atrial fibrillation - Continue anticoagulation. Continue amiodarone. Intermittently tachycardic and bradycardic  Hypertension - monitor blood pressure. Use amlodipine if able to tolerate oral.  Diabetes mellitus type 2 with hyperglycemia - Accu-Cheks with insulin sliding scale coverage - continue Lantus  Generalized deconditioning with very poor oral intake - Palliative care consult for goals of care. Overall prognosis is guarded to poor - nursing staff has tried to call the daughter multiple times without any success. I have tried to do the same without success.  - PICC team has failed to obtain any IV access despite multiple attempts. PT and recommended that patient needed IV access, we will need to involve IR. Keep encouraging the patient to try and eat.It still looks like the patient is intermittently spitting out all her medications. If she keeps doing that, medical care would be futile. Her overall general condition is not getting better and progressively getting worse. Doing daily labs will be futile given her very poor prognosis.I would suggest that patient be made hospice/comfort measures. I have spoken to palliative care/Dr. Hilma Favors and ethics committee/Mr. Hamilton and discussed the plan of care. I have called urology consult as well because of patient's renal mass.   Hypokalemia and hypomagnesemia - I will not order labs for tomorrow as discussed above  Right renal mass - Being followed by urology as an outpatient? - CT abdomen still shows right renal mass. Urology consulted on phone. Overall prognosis is very poor  DVT prophylaxis: Eliquis Code Status:  DO NOT RESUSCITATE Family Communication: None at bedside Disposition Plan: Depends on clinical outcome  Consultants: Palliative care  Procedures: None  Antimicrobials: Cefepime on  10/05/2016 and 10/06/2016 Vancomycin from 10/05/2016-10/06/2016 augmentin was ordered on 10/08/2016 but patient is not taking it  Subjective: Patient seen and examined at bedside. She is sleepy, does not want to wake up on calling her name. No overnight fever documented. Objective: Vitals:   10/08/16 0540 10/08/16 1418 10/08/16 2018 10/09/16 0528  BP: (!) 152/96 (!) 117/91 (!) 164/68 (!) 147/53  Pulse: (!) 103 61 (!) 102 (!) 52  Resp: 17 17 17 17   Temp: 98.3 F (36.8 C) (!) 97.5 F (36.4 C) 98.2 F (36.8 C) (!) 97.5 F (36.4 C)  TempSrc: Axillary Axillary Axillary Oral  SpO2: 97% 99% 92% 100%  Weight:      Height:        Intake/Output Summary (Last 24 hours) at 10/09/16 1406 Last data filed at 10/08/16 2251  Gross per 24 hour  Intake           686.67 ml  Output                0 ml  Net           686.67 ml   Filed Weights   10/05/16 2049  Weight: 48.7 kg (107 lb 4.8 oz)    Examination:  General exam: Appears Very frail and ill. Respiratory system: Bilateral decreased breath sound at bases with scattered crackles Cardiovascular system: S1 & S2 heard, rate controlled.  Gastrointestinal system: Abdomen is nondistended, soft and no tenderness. Normal bowel sounds heard. Extremities: No cyanosis, clubbing, edema    Data Reviewed: I have personally reviewed following labs and imaging studies  CBC:  Recent Labs Lab 10/04/16 1659 10/05/16 1535 10/06/16 0448 10/07/16 1411 10/08/16 0929  WBC 5.1 3.6* 7.0 6.1 6.2  NEUTROABS 2.8 2.8  --  5.1 4.9  HGB 9.9* 10.9* 10.3* 10.4* 11.3*  HCT 29.9* 34.3* 32.5* 31.3* 34.7*  MCV 84.1 84.9 83.8 82.2 82.4  PLT 349 394 394 345 948   Basic Metabolic Panel:  Recent Labs Lab 10/04/16 1659 10/05/16 1535 10/06/16 0448 10/07/16 1411 10/08/16 0654  NA 144 140 142 144 145  K 2.7* 3.3* 4.2 2.3* 2.3*  CL 109 105 112* 115* 113*  CO2 21* 12* 16* 17* 19*  GLUCOSE 139* 294* 249* 275* 267*  BUN 15 11 10 10 8   CREATININE 1.12* 1.38*  1.20* 1.41* 1.25*  CALCIUM 8.4* 8.8* 8.3* 8.7* 8.8*  MG  --   --   --  1.6* 1.6*   GFR: Estimated Creatinine Clearance: 24.4 mL/min (A) (by C-G formula based on SCr of 1.25 mg/dL (H)). Liver Function Tests:  Recent Labs Lab 10/04/16 1659 10/05/16 1535 10/06/16 0448  AST 13* 17 42*  ALT 10* 11* 11*  ALKPHOS 58 73 60  BILITOT 0.9 1.3* 2.8*  PROT 5.4* 6.5 5.4*  ALBUMIN 2.4* 3.0* 2.7*   No results for input(s): LIPASE, AMYLASE in the last 168 hours. No results for input(s): AMMONIA in the last 168 hours. Coagulation Profile:  Recent Labs Lab 10/04/16 1659 10/05/16 1535  INR 0.99 0.98   Cardiac Enzymes: No results for input(s): CKTOTAL, CKMB, CKMBINDEX, TROPONINI in the last 168 hours. BNP (last 3 results) No results for input(s): PROBNP in the last 8760 hours. HbA1C: No results for input(s): HGBA1C in the last 72 hours. CBG:  Recent Labs Lab 10/08/16 1258 10/08/16 1716 10/08/16 2019 10/09/16 0753 10/09/16 1213  GLUCAP 192* 139* 153* 165* 123*   Lipid Profile: No results for input(s): CHOL, HDL, LDLCALC, TRIG,  CHOLHDL, LDLDIRECT in the last 72 hours. Thyroid Function Tests: No results for input(s): TSH, T4TOTAL, FREET4, T3FREE, THYROIDAB in the last 72 hours. Anemia Panel: No results for input(s): VITAMINB12, FOLATE, FERRITIN, TIBC, IRON, RETICCTPCT in the last 72 hours. Sepsis Labs:  Recent Labs Lab 10/05/16 1548  LATICACIDVEN 2.20*    Recent Results (from the past 240 hour(s))  Culture, blood (Routine x 2)     Status: None (Preliminary result)   Collection Time: 10/05/16  3:34 PM  Result Value Ref Range Status   Specimen Description BLOOD RIGHT ANTECUBITAL  Final   Special Requests IN PEDIATRIC BOTTLE Blood Culture adequate volume  Final   Culture NO GROWTH 4 DAYS  Final   Report Status PENDING  Incomplete  Culture, blood (Routine x 2)     Status: None (Preliminary result)   Collection Time: 10/05/16  3:35 PM  Result Value Ref Range Status    Specimen Description BLOOD LEFT ANTECUBITAL  Final   Special Requests   Final    BOTTLES DRAWN AEROBIC AND ANAEROBIC Blood Culture adequate volume   Culture NO GROWTH 4 DAYS  Final   Report Status PENDING  Incomplete  Culture, Urine     Status: Abnormal   Collection Time: 10/05/16  6:00 PM  Result Value Ref Range Status   Specimen Description URINE, CLEAN CATCH  Final   Special Requests NONE  Final   Culture (A)  Final    60,000 COLONIES/mL PROTEUS MIRABILIS 20,000 COLONIES/mL ENTEROCOCCUS FAECALIS    Report Status 10/08/2016 FINAL  Final   Organism ID, Bacteria PROTEUS MIRABILIS (A)  Final   Organism ID, Bacteria ENTEROCOCCUS FAECALIS (A)  Final      Susceptibility   Enterococcus faecalis - MIC*    AMPICILLIN <=2 SENSITIVE Sensitive     LEVOFLOXACIN 1 SENSITIVE Sensitive     NITROFURANTOIN <=16 SENSITIVE Sensitive     VANCOMYCIN 1 SENSITIVE Sensitive     * 20,000 COLONIES/mL ENTEROCOCCUS FAECALIS   Proteus mirabilis - MIC*    AMPICILLIN <=2 SENSITIVE Sensitive     CEFAZOLIN <=4 SENSITIVE Sensitive     CEFTRIAXONE <=1 SENSITIVE Sensitive     CIPROFLOXACIN >=4 RESISTANT Resistant     GENTAMICIN 8 INTERMEDIATE Intermediate     IMIPENEM 4 SENSITIVE Sensitive     NITROFURANTOIN 128 RESISTANT Resistant     TRIMETH/SULFA >=320 RESISTANT Resistant     AMPICILLIN/SULBACTAM <=2 SENSITIVE Sensitive     PIP/TAZO <=4 SENSITIVE Sensitive     * 60,000 COLONIES/mL PROTEUS MIRABILIS         Radiology Studies: No results found.      Scheduled Meds: . amiodarone  200 mg Oral Daily  . amLODipine  10 mg Oral Daily  . amoxicillin-clavulanate  1 tablet Oral BID  . apixaban  2.5 mg Oral BID  . atorvastatin  20 mg Oral q1800  . docusate sodium  100 mg Oral BID  . gabapentin  300 mg Oral QHS  . insulin aspart  0-9 Units Subcutaneous TID WC  . insulin glargine  4 Units Subcutaneous QHS  . pantoprazole  40 mg Oral BID  . polyethylene glycol  17 g Oral Daily  . potassium chloride  40  mEq Oral BID  . sucralfate  1 g Oral TID WC & HS  . tamsulosin  0.4 mg Oral QPC breakfast   Continuous Infusions: . sodium chloride 50 mL/hr at 10/07/16 0513     LOS: 4 days  Aline August, MD [facility] Pager [phone]  If 7PM-7AM, please contact night-coverage [email or web address] Password [email or web address] 10/09/2016, 2:06 PM

## 2016-10-09 NOTE — Progress Notes (Signed)
Patient sat upright and aroused through verbal stimulation. Patient was able to say her name and that her birthday is in [birth year]. Patient asked if she knew what building she was in and she said no. I told her she was in the hospital and that she was sick. I asked her if she wanted to get better and after asking twice she reluctantly shook her head yes. After explaining her potassium levels were low I gave her 3 cc of the potassium solution through a syringe and she swallowed it. I proceeded to giver her 20 MEQ of the potassium and she held it in her mouth for a short moment before spitting it out. I offered her water and she said yes. Patient was able to swallow 15 cc of water via a syringe. I was able to get patient to take amlodipine with a bite of applesauce. Patient spit out amiodarone. At this time patient turns to her side and refuses to take anymore medication. I offer a small bite of jello from her breakfeast tray, in which she takes a pediatric bite. Patient offered more and she firmly says no.   Will continue to attempt feeding and medication administration until plan of care changes.

## 2016-10-09 NOTE — Ethics Note (Signed)
Ethics Note:  I received a call from patient's RN Hinton Dyer at request of physician. The concern was that the patient has low potassium and there is a potential need to place a PICC line to treat. The next of kin is the patient's daughter who has not been responding to calls to give consent.   Dr. Andria Frames, chair of the Ethics Committee, and I discussed this case and there is, when patients are admitted, a presumption that necessary treatments to address medical issues will occur. In the absence of being able to reach the daughter in a timely manner, and when there is medical urgency, the physician can proceed with appropriate medical interventions including in this case the placement of the PICC line.   If there are further concerns please feel free to reconsult the ethics consult service Pager [phone].   [facility] Ethics Committee Consult service.

## 2016-10-09 NOTE — Consult Note (Addendum)
H&P Physician requesting consult: Dr Aline August, MD  Chief Complaint: ? Right renal TCC  History of Present Illness: 83 y.o. female with multiple problems including hypertension, diabetes, chronic kidney disease, pulmonary embolism on oral anticoagulation, hyperlipidemia, possible dementia and recent admission for acute encephalopathy due to severe hypoglycemia and hematemesis with possible upper GI bleed , now admitted for altered mental status and likely pneumonia. Her overall condition is not improving. Palliative care has been consulted. On prior CT scans, there is evidence of possible transitional cell carcinoma of the right renal pelvis. I was asked to see the patient to determine if there was anything surgical to do for this. The patient is currently nonverbal and will not answer any of my questions. She appears frail and fragile.   Past Medical History:  Diagnosis Date  . Anxiety   . Arthritis   . Chronic kidney disease   . Diabetes mellitus without complication ([place])   . Dizziness   . Dyspnea   . GERD (gastroesophageal reflux disease)   . Hyperlipidemia   . Hypertension   . Pulmonary embolism ([place]) 2016   Past Surgical History:  Procedure Laterality Date  . APPENDECTOMY    . BACK SURGERY    . ESOPHAGOGASTRODUODENOSCOPY N/A 05/29/2016   Procedure: ESOPHAGOGASTRODUODENOSCOPY (EGD);  Surgeon: Clarene Essex, MD;  Location: [place];  Service: Gastroenterology;  Laterality: N/A;  . SHOULDER SURGERY Left     Home Medications:  Prescriptions Prior to Admission  Medication Sig Dispense Refill Last Dose  . amiodarone (PACERONE) 200 MG tablet Take 1 tablet (200 mg total) by mouth daily. 30 tablet 0 10/05/2016 at Unknown time  . apixaban (ELIQUIS) 2.5 MG TABS tablet Take 1 tablet (2.5 mg total) by mouth 2 (two) times daily. 60 tablet 2 10/05/2016 at Unknown time  . atorvastatin (LIPITOR) 20 MG tablet Take 20 mg by mouth at bedtime.    Past Week at Unknown time  . docusate sodium  (COLACE) 100 MG capsule Take 1 capsule (100 mg total) by mouth 2 (two) times daily. 10 capsule 0 10/05/2016 at Unknown time  . doxycycline (VIBRAMYCIN) 100 MG capsule Take 1 capsule (100 mg total) by mouth 2 (two) times daily. 20 capsule 0 10/05/2016 at Unknown time  . ferrous sulfate 325 (65 FE) MG tablet Take 1 tablet (325 mg total) by mouth daily with breakfast. 60 tablet 3 10/05/2016 at Unknown time  . gabapentin (NEURONTIN) 300 MG capsule Take 300 mg by mouth at bedtime.    Past Week at Unknown time  . magnesium oxide (MAG-OX) 400 (241.3 Mg) MG tablet Take 1 tablet (400 mg total) by mouth daily. 30 tablet 0 Past Week at Unknown time  . megestrol (MEGACE) 40 MG tablet Take 1 tablet (40 mg total) by mouth daily. 30 tablet 2 10/05/2016 at Unknown time  . metoCLOPramide (REGLAN) 5 MG tablet Take 1 tablet (5 mg total) by mouth 2 (two) times daily. 60 tablet 2 10/05/2016 at Unknown time  . Multiple Vitamin (MULTIVITAMIN WITH MINERALS) TABS tablet Take 1 tablet by mouth daily. 30 tablet 2 Past Week at Unknown time  . ondansetron (ZOFRAN-ODT) 4 MG disintegrating tablet Take 4 mg by mouth every 6 (six) hours as needed for nausea or vomiting.   UNKNOWN  . oxyCODONE-acetaminophen (PERCOCET) 7.5-325 MG tablet Take 1 tablet by mouth 2 (two) times daily as needed for severe pain. 30 tablet 0 Past Week at Unknown time  . pantoprazole (PROTONIX) 40 MG tablet Take 1 tablet (40 mg total)  by mouth 2 (two) times daily. 60 tablet 0 10/05/2016 at Unknown time  . polyethylene glycol (MIRALAX) packet Take 17 g by mouth daily. 30 each 0 Past Week at Unknown time  . Potassium Chloride 40 MEQ/15ML (20%) SOLN Take 40 mEq by mouth 2 (two) times daily. 30 mL 0 10/05/2016 at Unknown time  . Potassium Chloride 40 MEQ/15ML (20%) SOLN Take 15 mLs by mouth 2 (two) times daily.   10/05/2016 at Unknown time  . promethazine (PHENERGAN) 25 MG tablet Take 25 mg by mouth every 6 (six) hours as needed for nausea or vomiting.   Past Week at  Unknown time  . sertraline (ZOLOFT) 25 MG tablet Take 1 tablet (25 mg total) by mouth daily. 30 tablet 2 10/05/2016 at Unknown time  . sucralfate (CARAFATE) 1 GM/10ML suspension Take 10 mLs (1 g total) by mouth 4 (four) times daily -  with meals and at bedtime. 420 mL 0 10/05/2016 at Unknown time  . tamsulosin (FLOMAX) 0.4 MG CAPS capsule Take 1 capsule (0.4 mg total) by mouth daily. (Patient taking differently: Take 0.4 mg by mouth daily after breakfast. ) 30 capsule 0 Past Week at Unknown time  . potassium chloride (K-DUR) 10 MEQ tablet Take 4 tablets (40 mEq total) by mouth 2 (two) times daily. 8 tablet 0    Allergies: No Known Allergies  Family History  Problem Relation Age of Onset  . Diabetes Mother   . Cancer Mother   . Cancer Father   . Bladder Cancer Neg Hx   . Kidney cancer Neg Hx    Social History:  reports that she has never smoked. She has never used smokeless tobacco. She reports that she does not drink alcohol or use drugs.  ROS: A complete review of systems was performed.  All systems are negative except for pertinent findings as noted. ROS   Physical Exam:  Vital signs in last 24 hours: Temp:  [97.5 F (36.4 C)-98.2 F (36.8 C)] 98.2 F (36.8 C) (10/01 1539) Pulse Rate:  [52-102] 86 (10/01 1539) Resp:  [17] 17 (10/01 1539) BP: (147-164)/(53-68) 156/62 (10/01 1539) SpO2:  [92 %-100 %] 98 % (10/01 1539) General:  Nonverbal but in No acute distress HEENT: Normocephalic, atraumatic Cardiovascular: adequate peripheral perfusion Lungs: Regular rate and effort Abdomen: Soft, nontender, nondistended, no abdominal masses Back: No CVA tenderness Extremities: No edema Neurologic: nonverbal  Laboratory Data:  Results for orders placed or performed during the hospital encounter of 10/05/16 (from the past 24 hour(s))  Glucose, capillary     Status: Abnormal   Collection Time: 10/08/16  8:19 PM  Result Value Ref Range   Glucose-Capillary 153 (H) 65 - 99 mg/dL  Glucose,  capillary     Status: Abnormal   Collection Time: 10/09/16  7:53 AM  Result Value Ref Range   Glucose-Capillary 165 (H) 65 - 99 mg/dL   Comment 1 Notify RN   Glucose, capillary     Status: Abnormal   Collection Time: 10/09/16 12:13 PM  Result Value Ref Range   Glucose-Capillary 123 (H) 65 - 99 mg/dL  Glucose, capillary     Status: Abnormal   Collection Time: 10/09/16  5:13 PM  Result Value Ref Range   Glucose-Capillary 139 (H) 65 - 99 mg/dL   Comment 1 Notify RN    Recent Results (from the past 240 hour(s))  Culture, blood (Routine x 2)     Status: None (Preliminary result)   Collection Time: 10/05/16  3:34 PM  Result Value Ref Range Status   Specimen Description BLOOD RIGHT ANTECUBITAL  Final   Special Requests IN PEDIATRIC BOTTLE Blood Culture adequate volume  Final   Culture NO GROWTH 4 DAYS  Final   Report Status PENDING  Incomplete  Culture, blood (Routine x 2)     Status: None (Preliminary result)   Collection Time: 10/05/16  3:35 PM  Result Value Ref Range Status   Specimen Description BLOOD LEFT ANTECUBITAL  Final   Special Requests   Final    BOTTLES DRAWN AEROBIC AND ANAEROBIC Blood Culture adequate volume   Culture NO GROWTH 4 DAYS  Final   Report Status PENDING  Incomplete  Culture, Urine     Status: Abnormal   Collection Time: 10/05/16  6:00 PM  Result Value Ref Range Status   Specimen Description URINE, CLEAN CATCH  Final   Special Requests NONE  Final   Culture (A)  Final    60,000 COLONIES/mL PROTEUS MIRABILIS 20,000 COLONIES/mL ENTEROCOCCUS FAECALIS    Report Status 10/08/2016 FINAL  Final   Organism ID, Bacteria PROTEUS MIRABILIS (A)  Final   Organism ID, Bacteria ENTEROCOCCUS FAECALIS (A)  Final      Susceptibility   Enterococcus faecalis - MIC*    AMPICILLIN <=2 SENSITIVE Sensitive     LEVOFLOXACIN 1 SENSITIVE Sensitive     NITROFURANTOIN <=16 SENSITIVE Sensitive     VANCOMYCIN 1 SENSITIVE Sensitive     * 20,000 COLONIES/mL ENTEROCOCCUS FAECALIS    Proteus mirabilis - MIC*    AMPICILLIN <=2 SENSITIVE Sensitive     CEFAZOLIN <=4 SENSITIVE Sensitive     CEFTRIAXONE <=1 SENSITIVE Sensitive     CIPROFLOXACIN >=4 RESISTANT Resistant     GENTAMICIN 8 INTERMEDIATE Intermediate     IMIPENEM 4 SENSITIVE Sensitive     NITROFURANTOIN 128 RESISTANT Resistant     TRIMETH/SULFA >=320 RESISTANT Resistant     AMPICILLIN/SULBACTAM <=2 SENSITIVE Sensitive     PIP/TAZO <=4 SENSITIVE Sensitive     * 60,000 COLONIES/mL PROTEUS MIRABILIS   Creatinine:  Recent Labs  10/04/16 1659 10/05/16 1535 10/06/16 0448 10/07/16 1411 10/08/16 0654  CREATININE 1.12* 1.38* 1.20* 1.41* 1.25*   CT: IMPRESSION: 1. No bowel obstruction or bowel inflammation. Partially visible gastric hiatal hernia, but normal noncontrast appearance of the visualized portion of the stomach and duodenum. 2. Distended gallbladder but no CT evidence of acute cholecystitis. 3. No evidence of acute obstructive uropathy, but chronically abnormal right renal collecting system. As described on prior studies the differential considerations include transitional cell carcinoma (perhaps treated/calcified), chronic fungal infection, chronic blood clots. 4. Small right pleural effusion has decreased. Interval resolved small left pleural effusion. 5.  Aortic Atherosclerosis (ICD10-I70.0).  Impression/Assessment:  Renal pelvis mass  Plan:  On review of imaging, the patient potentially has transitional cell carcinoma of the right renal pelvis. To obtain a diagnosis we would normally perform a ureteroscopy with biopsy and if transitional cell carcinoma were confirmed then we would perform a nephroureterectomy. However in the patient's current state, she is certainly too frail and sick to undergo any sort of surgical intervention. At this point in time would not recommend pursuing any further investigation into the right renal pelvic mass. She has no lymphadenopathy so in all likelihood if this  is transitional cell carcinoma, then it is clinically localized. I agree with consultation with palliative care. Please call  With any questions or concerns.  Marton Redwood, III 10/09/2016, 6:43 PM

## 2016-10-10 ENCOUNTER — Inpatient Hospital Stay (HOSPITAL_COMMUNITY): Payer: Medicare Other

## 2016-10-10 DIAGNOSIS — F0391 Unspecified dementia with behavioral disturbance: Secondary | ICD-10-CM

## 2016-10-10 LAB — GLUCOSE, CAPILLARY
GLUCOSE-CAPILLARY: 131 mg/dL — AB (ref 65–99)
GLUCOSE-CAPILLARY: 156 mg/dL — AB (ref 65–99)
GLUCOSE-CAPILLARY: 172 mg/dL — AB (ref 65–99)
Glucose-Capillary: 190 mg/dL — ABNORMAL HIGH (ref 65–99)

## 2016-10-10 LAB — CULTURE, BLOOD (ROUTINE X 2)
Culture: NO GROWTH
Culture: NO GROWTH
SPECIAL REQUESTS: ADEQUATE
SPECIAL REQUESTS: ADEQUATE

## 2016-10-10 MED ORDER — POTASSIUM CHLORIDE 20 MEQ PO PACK
40.0000 meq | PACK | Freq: Two times a day (BID) | ORAL | Status: DC
  Administered 2016-10-10 – 2016-10-11 (×2): 40 meq via ORAL
  Filled 2016-10-10 (×4): qty 2

## 2016-10-10 MED ORDER — SUCRALFATE 1 G PO TABS
1.0000 g | ORAL_TABLET | Freq: Three times a day (TID) | ORAL | Status: DC
  Administered 2016-10-10 – 2016-10-19 (×6): 1 g via ORAL
  Filled 2016-10-10 (×21): qty 1

## 2016-10-10 NOTE — Progress Notes (Signed)
Pt was transferred to IR for possible IV access and IV team but unsuccesful, I tried to give her meds with apple sauce and drink apple juice but she refused her food.

## 2016-10-10 NOTE — Progress Notes (Signed)
Pt transfer to IR

## 2016-10-10 NOTE — Progress Notes (Signed)
Patient ID: Sherry [name], female   DOB: 1927/05/30, 83 y.o.   MRN: [id number]  PROGRESS NOTE    Sherry [name]  WUJ:[id number] DOB: 19-Feb-1927 DOA: 10/05/2016 PCP: [name], MD   Brief Narrative:  83 y.o. female with medical history significant of hypertension, diabetes, chronic kidney disease, pulmonary embolism on oral anticoagulation, hyperlipidemia, questionable dementia and recent admission and discharge on 09/19/2016 at [facility] for acute encephalopathy due to severe hypoglycemia and hematemesis with possible upper GI bleed was sent from nursing home for altered mental status. She was admitted with probable pneumonia. Her overall condition is not improving. Palliative care consultation was called.  Assessment & Plan:   Principal Problem:   Pneumonia Active Problems:   Chronic kidney disease (CKD), stage III (moderate) (HCC)   Essential hypertension   Nausea & vomiting   Probable healthcare associated pneumonia - blood cultures negative so far. Respiratory status stable. Repeat chest x-ray from today shows no evidence of infiltrates. continue Augmentin for 5 more days if patient is agreeable - Urine streptococcal antigen was positive.   - Oxygen supplementation if needed - SLP evaluation and diet as per SLP recommendations. Patient is hardly eating anything and spits out most of the medications. - Aspiration precautions  Probable UTI - Urine culture is growing enterococcus and Proteus. Antibiotic plan as above  Altered mental status with probable acute metabolic encephalopathy - Patient's baseline mental status is unknown; patient might have dementia as well - She is awake intermittently but does not answer any questions. She has been refusing to engage in any kind of conversation - Fall precautions  Nausea and vomiting - Questionable cause. Might be related to pneumonia. - CT abdomen did not show any obstruction. - Vomiting has resolved.   Chronic  kidney disease stage III - creatinine stable few days ago  History of pulmonary embolism - Continue anticoagulation with Eliquis if starts to take pills by mouth  Probable chronic atrial fibrillation - Continue anticoagulation. Continue amiodarone. Intermittently  bradycardic  Hypertension - monitor blood pressure. Use amlodipine if able to tolerate oral.  Diabetes mellitus type 2 with hyperglycemia - Accu-Cheks with insulin sliding scale coverage - continue Lantus  Generalized deconditioning with very poor oral intake - Palliative care consult for goals of care. Overall prognosis is guarded to poor - nursing staff has tried to call the daughter multiple times without any success. I have tried to do the same without success.  - PICC team has failed to obtain any IV access despite multiple attempts. PICC team  recommended that if patient needed IV access, we will need to involve IR. Keep encouraging the patient to try and eat.It still looks like the patient is intermittently spitting out all her medications. If she keeps doing that, medical care would be futile. Her overall general condition is not getting better and progressively getting worse. Doing daily labs will be futile given her very poor prognosis.I would suggest that patient be made hospice/comfort measures. I have spoken to palliative care/Dr. Hilma Favors and ethics committee/Mr. Hamilton on 10/09/2016 on phone and discussed the plan of care. Awaiting further recommendations from palliative care.    Hypokalemia and hypomagnesemia - I will not order labs for tomorrow as discussed above  Right renal mass - Being followed by urology as an outpatient? - CT abdomen still shows right renal mass.  - urology consult appreciated: No further workup suggested at this time because of overall poor prognosis.  DVT prophylaxis: Eliquis Code Status:  DO NOT RESUSCITATE Family Communication: None at bedside Disposition Plan: Depends on  clinical outcome  Consultants: Palliative care; urology  Procedures: None  Antimicrobials: Cefepime on 10/05/2016 and 10/06/2016 Vancomycin from 10/05/2016-10/06/2016 augmentin was ordered on 10/08/2016 but patient is not taking it  Subjective: Patient seen and examined at bedside. She is sleepy, does not want to wake up on calling her name. No overnight fever documented. Objective: Vitals:   10/09/16 0528 10/09/16 1539 10/09/16 2101 10/10/16 0523  BP: (!) 147/53 (!) 156/62 (!) 125/50 129/68  Pulse: (!) 52 86 (!) 51 (!) 55  Resp: 17 17 17 17   Temp: (!) 97.5 F (36.4 C) 98.2 F (36.8 C) 97.9 F (36.6 C) (!) 97.5 F (36.4 C)  TempSrc: Oral Axillary Oral Oral  SpO2: 100% 98% 100% 100%  Weight:      Height:        Intake/Output Summary (Last 24 hours) at 10/10/16 1116 Last data filed at 10/09/16 2320  Gross per 24 hour  Intake               90 ml  Output                0 ml  Net               90 ml   Filed Weights   10/05/16 2049  Weight: 48.7 kg (107 lb 4.8 oz)    Examination:  General exam: Appears Very frail and ill. Respiratory system: Bilateral decreased breath sound at bases with scattered crackles Cardiovascular system: S1 & S2 heard, rate controlled.    Data Reviewed: I have personally reviewed following labs and imaging studies  CBC:  Recent Labs Lab 10/04/16 1659 10/05/16 1535 10/06/16 0448 10/07/16 1411 10/08/16 0929  WBC 5.1 3.6* 7.0 6.1 6.2  NEUTROABS 2.8 2.8  --  5.1 4.9  HGB 9.9* 10.9* 10.3* 10.4* 11.3*  HCT 29.9* 34.3* 32.5* 31.3* 34.7*  MCV 84.1 84.9 83.8 82.2 82.4  PLT 349 394 394 345 287   Basic Metabolic Panel:  Recent Labs Lab 10/04/16 1659 10/05/16 1535 10/06/16 0448 10/07/16 1411 10/08/16 0654  NA 144 140 142 144 145  K 2.7* 3.3* 4.2 2.3* 2.3*  CL 109 105 112* 115* 113*  CO2 21* 12* 16* 17* 19*  GLUCOSE 139* 294* 249* 275* 267*  BUN 15 11 10 10 8   CREATININE 1.12* 1.38* 1.20* 1.41* 1.25*  CALCIUM 8.4* 8.8* 8.3* 8.7*  8.8*  MG  --   --   --  1.6* 1.6*   GFR: Estimated Creatinine Clearance: 24.4 mL/min (A) (by C-G formula based on SCr of 1.25 mg/dL (H)). Liver Function Tests:  Recent Labs Lab 10/04/16 1659 10/05/16 1535 10/06/16 0448  AST 13* 17 42*  ALT 10* 11* 11*  ALKPHOS 58 73 60  BILITOT 0.9 1.3* 2.8*  PROT 5.4* 6.5 5.4*  ALBUMIN 2.4* 3.0* 2.7*   No results for input(s): LIPASE, AMYLASE in the last 168 hours. No results for input(s): AMMONIA in the last 168 hours. Coagulation Profile:  Recent Labs Lab 10/04/16 1659 10/05/16 1535  INR 0.99 0.98   Cardiac Enzymes: No results for input(s): CKTOTAL, CKMB, CKMBINDEX, TROPONINI in the last 168 hours. BNP (last 3 results) No results for input(s): PROBNP in the last 8760 hours. HbA1C: No results for input(s): HGBA1C in the last 72 hours. CBG:  Recent Labs Lab 10/09/16 0753 10/09/16 1213 10/09/16 1713 10/09/16 2100 10/10/16 0742  GLUCAP 165* 123* 139* 120* 131*  Lipid Profile: No results for input(s): CHOL, HDL, LDLCALC, TRIG, CHOLHDL, LDLDIRECT in the last 72 hours. Thyroid Function Tests: No results for input(s): TSH, T4TOTAL, FREET4, T3FREE, THYROIDAB in the last 72 hours. Anemia Panel: No results for input(s): VITAMINB12, FOLATE, FERRITIN, TIBC, IRON, RETICCTPCT in the last 72 hours. Sepsis Labs:  Recent Labs Lab 10/05/16 1548  LATICACIDVEN 2.20*    Recent Results (from the past 240 hour(s))  Culture, blood (Routine x 2)     Status: None (Preliminary result)   Collection Time: 10/05/16  3:34 PM  Result Value Ref Range Status   Specimen Description BLOOD RIGHT ANTECUBITAL  Final   Special Requests IN PEDIATRIC BOTTLE Blood Culture adequate volume  Final   Culture NO GROWTH 4 DAYS  Final   Report Status PENDING  Incomplete  Culture, blood (Routine x 2)     Status: None (Preliminary result)   Collection Time: 10/05/16  3:35 PM  Result Value Ref Range Status   Specimen Description BLOOD LEFT ANTECUBITAL  Final    Special Requests   Final    BOTTLES DRAWN AEROBIC AND ANAEROBIC Blood Culture adequate volume   Culture NO GROWTH 4 DAYS  Final   Report Status PENDING  Incomplete  Culture, Urine     Status: Abnormal   Collection Time: 10/05/16  6:00 PM  Result Value Ref Range Status   Specimen Description URINE, CLEAN CATCH  Final   Special Requests NONE  Final   Culture (A)  Final    60,000 COLONIES/mL PROTEUS MIRABILIS 20,000 COLONIES/mL ENTEROCOCCUS FAECALIS    Report Status 10/08/2016 FINAL  Final   Organism ID, Bacteria PROTEUS MIRABILIS (A)  Final   Organism ID, Bacteria ENTEROCOCCUS FAECALIS (A)  Final      Susceptibility   Enterococcus faecalis - MIC*    AMPICILLIN <=2 SENSITIVE Sensitive     LEVOFLOXACIN 1 SENSITIVE Sensitive     NITROFURANTOIN <=16 SENSITIVE Sensitive     VANCOMYCIN 1 SENSITIVE Sensitive     * 20,000 COLONIES/mL ENTEROCOCCUS FAECALIS   Proteus mirabilis - MIC*    AMPICILLIN <=2 SENSITIVE Sensitive     CEFAZOLIN <=4 SENSITIVE Sensitive     CEFTRIAXONE <=1 SENSITIVE Sensitive     CIPROFLOXACIN >=4 RESISTANT Resistant     GENTAMICIN 8 INTERMEDIATE Intermediate     IMIPENEM 4 SENSITIVE Sensitive     NITROFURANTOIN 128 RESISTANT Resistant     TRIMETH/SULFA >=320 RESISTANT Resistant     AMPICILLIN/SULBACTAM <=2 SENSITIVE Sensitive     PIP/TAZO <=4 SENSITIVE Sensitive     * 60,000 COLONIES/mL PROTEUS MIRABILIS         Radiology Studies: Dg Chest 2 View  Result Date: 10/10/2016 CLINICAL DATA:  Dyspnea EXAM: CHEST  2 VIEW COMPARISON:  Portable chest x-ray of October 05, 2016 FINDINGS: The lungs are mildly hyperinflated but clear. The heart and pulmonary vascularity are normal. There is calcification in the wall of the aortic arch. There is no pleural effusion. The bony thorax exhibits no acute abnormality. There are degenerative changes of both shoulders. IMPRESSION: Hyperinflation consistent with chronic bronchitis. No CHF nor pneumonia. Thoracic aortic  atherosclerosis. Electronically Signed   By: David  Martinique M.D.   On: 10/10/2016 09:47        Scheduled Meds: . amiodarone  200 mg Oral Daily  . amLODipine  10 mg Oral Daily  . amoxicillin-clavulanate  1 tablet Oral BID  . apixaban  2.5 mg Oral BID  . atorvastatin  20 mg Oral q1800  .  docusate sodium  100 mg Oral BID  . gabapentin  300 mg Oral QHS  . insulin aspart  0-9 Units Subcutaneous TID WC  . insulin glargine  4 Units Subcutaneous QHS  . pantoprazole  40 mg Oral BID  . polyethylene glycol  17 g Oral Daily  . potassium chloride  40 mEq Oral BID  . sucralfate  1 g Oral TID WC & HS  . tamsulosin  0.4 mg Oral QPC breakfast   Continuous Infusions: . sodium chloride 50 mL/hr at 10/07/16 0513     LOS: 5 days        Aline August, MD [facility] Pager [phone]  If 7PM-7AM, please contact night-coverage [email or web address] Password [email or web address] 10/10/2016, 11:16 AM

## 2016-10-10 NOTE — Progress Notes (Addendum)
Patient able to take crushed eliquis and protonix with AS but spitted out other crushed meds, neurontin and augmentin with AS.  Took a little sip of orange juice after the first teaspoon of meds with AS but refused to take any further POs and said "No" to any oral intake.

## 2016-10-11 DIAGNOSIS — I10 Essential (primary) hypertension: Secondary | ICD-10-CM | POA: Diagnosis not present

## 2016-10-11 DIAGNOSIS — N183 Chronic kidney disease, stage 3 (moderate): Secondary | ICD-10-CM | POA: Diagnosis not present

## 2016-10-11 DIAGNOSIS — R112 Nausea with vomiting, unspecified: Secondary | ICD-10-CM | POA: Diagnosis not present

## 2016-10-11 LAB — GLUCOSE, CAPILLARY
GLUCOSE-CAPILLARY: 153 mg/dL — AB (ref 65–99)
GLUCOSE-CAPILLARY: 174 mg/dL — AB (ref 65–99)
GLUCOSE-CAPILLARY: 169 mg/dL — AB (ref 65–99)
Glucose-Capillary: 236 mg/dL — ABNORMAL HIGH (ref 65–99)

## 2016-10-11 MED ORDER — ENSURE ENLIVE PO LIQD
237.0000 mL | Freq: Three times a day (TID) | ORAL | Status: DC
  Administered 2016-10-11 – 2016-10-24 (×7): 237 mL via ORAL

## 2016-10-11 NOTE — Progress Notes (Signed)
Patient ID: Sherry [name], female   DOB: [birth year], 83 y.o.   MRN: [id number]  PROGRESS NOTE    Sherry [name]  WUJ:[id number] DOB: 26-Mar-1927 DOA: 10/05/2016 PCP: Sherry Richards, MD   Brief Narrative:  83 y.o. female with medical history significant of hypertension, diabetes, chronic kidney disease, pulmonary embolism on oral anticoagulation, hyperlipidemia, suspected dementia and recent admission and discharge on 09/19/2016 at [facility] for acute encephalopathy due to severe hypoglycemia and hematemesis with possible upper GI bleed was sent from nursing home for altered mental status. She was admitted with probable pneumonia. Her overall condition is not improving. Palliative care consultation was called. Pt refuses to eat or drink anything, refuses her meds  Assessment & Plan:  Probable healthcare associated pneumonia/atelectasis vs pneumonia noted on admission CXR - blood cultures negative  - Repeat chest x-ray from today shows no evidence of infiltrates - was on Iv ANx and then changed to PO Augmentin-she continues to refuse all meds - Urine streptococcal antigen was positive.   - Oxygen supplementation if needed - SLP evaluation and diet as per SLP recommendations. Patient is hardly eating anything and spits out most of the medications. - Aspiration precautions  Probable UTI - Urine culture is growing enterococcus and Proteus. Antibiotic plan as above  Altered mental status with probable acute metabolic encephalopathy - Patient's baseline mental status is unknown; patient might have some degree of dementia but nods yes or no and clearly refuses and says " No" when offered any meds or food - She is awake intermittently but does not answer any questions. She has been refusing to engage in any kind of conversation - Fall precautions  Nausea and vomiting -on admission, resolved - Questionable cause. Might be related to pneumonia. - CT abdomen did not show any  obstruction.  Chronic kidney disease stage III - creatinine stable, no value in repeat blood draws  History of pulmonary embolism - Continue anticoagulation with Eliquis, however mostly refuses most meds at this time  Probable chronic atrial fibrillation - Continue anticoagulation. Continue amiodarone. Intermittently  bradycardic  Hypertension - monitor blood pressure. Use amlodipine if able to tolerate oral.  Diabetes mellitus type 2 with hyperglycemia - Accu-Cheks with insulin sliding scale coverage - continue Lantus  Generalized deconditioning/Failure to thrive/ severe malnutrition and refusal to take meds and Po intake - We have tried to reach family members on numerous occasions over 1 week, unable to reach any family members to make any decisions regarding Sherry [name] - Palliative care consult for goals of care. Overall prognosis is very poor and Po intake is None at this time -in her current situation with refusal to eat anything, take any medications, doing anything aggressive would be futile -My recommendations are for comfort focused care -appreciate Palliative input per Dr.Golding -CSW working to contact SNF and see if they have some line of communication with family -I called both son and daughters numbers on facesheet -unable to reach anyone  Hypokalemia and hypomagnesemia - stopped daily labs at this time  Right renal mass - CT abdomen still shows right renal mass.  - urology consult appreciated, could be transitional cell CA - No further workup suggested at this time because of overall poor prognosis.  DVT prophylaxis: Eliquis Code Status:  DO NOT RESUSCITATE Family Communication: None at bedside Disposition Plan: unable to plan dispo without contact with family or court appointed guardian  Consultants: Palliative care; urology  Procedures: None  Antimicrobials: Cefepime on 10/05/2016 and 10/06/2016 Vancomycin  from 10/05/2016-10/06/2016 augmentin was  ordered on 10/08/2016 but patient is not taking it  Subjective: Pt seen and examined, care discussed with RN, she refuses all meals yesterday, shouted No when offered meds  Objective: Vitals:   10/10/16 1446 10/10/16 2035 10/11/16 0412 10/11/16 1343  BP: 134/62 (!) 126/59 (!) 108/41 (!) 117/40  Pulse: 60 (!) 58 86 88  Resp: 18 17 17 19   Temp: 97.8 F (36.6 C) 97.6 F (36.4 C) 97.8 F (36.6 C) 98 F (36.7 C)  TempSrc: Oral Oral Oral Axillary  SpO2: 100% 99% 100% 100%  Weight:      Height:        Intake/Output Summary (Last 24 hours) at 10/11/16 1427 Last data filed at 10/11/16 0430  Gross per 24 hour  Intake              240 ml  Output                0 ml  Net              240 ml   Filed Weights   10/05/16 2049  Weight: 48.7 kg (107 lb 4.8 oz)    Examination:  Gen: frail, thinly built debilitated female, mumbles and nods yes and no CVS: S1S2/RRR Lungs: scattered ronchi at bases Abd: soft, NT, BS present Ext: no edema Skin: no rashes   Data Reviewed: I have personally reviewed following labs and imaging studies  CBC:  Recent Labs Lab 10/04/16 1659 10/05/16 1535 10/06/16 0448 10/07/16 1411 10/08/16 0929  WBC 5.1 3.6* 7.0 6.1 6.2  NEUTROABS 2.8 2.8  --  5.1 4.9  HGB 9.9* 10.9* 10.3* 10.4* 11.3*  HCT 29.9* 34.3* 32.5* 31.3* 34.7*  MCV 84.1 84.9 83.8 82.2 82.4  PLT 349 394 394 345 784   Basic Metabolic Panel:  Recent Labs Lab 10/04/16 1659 10/05/16 1535 10/06/16 0448 10/07/16 1411 10/08/16 0654  NA 144 140 142 144 145  K 2.7* 3.3* 4.2 2.3* 2.3*  CL 109 105 112* 115* 113*  CO2 21* 12* 16* 17* 19*  GLUCOSE 139* 294* 249* 275* 267*  BUN 15 11 10 10 8   CREATININE 1.12* 1.38* 1.20* 1.41* 1.25*  CALCIUM 8.4* 8.8* 8.3* 8.7* 8.8*  MG  --   --   --  1.6* 1.6*   GFR: Estimated Creatinine Clearance: 24.4 mL/min (A) (by C-G formula based on SCr of 1.25 mg/dL (H)). Liver Function Tests:  Recent Labs Lab 10/04/16 1659 10/05/16 1535 10/06/16 0448    AST 13* 17 42*  ALT 10* 11* 11*  ALKPHOS 58 73 60  BILITOT 0.9 1.3* 2.8*  PROT 5.4* 6.5 5.4*  ALBUMIN 2.4* 3.0* 2.7*   No results for input(s): LIPASE, AMYLASE in the last 168 hours. No results for input(s): AMMONIA in the last 168 hours. Coagulation Profile:  Recent Labs Lab 10/04/16 1659 10/05/16 1535  INR 0.99 0.98   Cardiac Enzymes: No results for input(s): CKTOTAL, CKMB, CKMBINDEX, TROPONINI in the last 168 hours. BNP (last 3 results) No results for input(s): PROBNP in the last 8760 hours. HbA1C: No results for input(s): HGBA1C in the last 72 hours. CBG:  Recent Labs Lab 10/10/16 1221 10/10/16 1637 10/10/16 2051 10/11/16 0816 10/11/16 1212  GLUCAP 156* 172* 190* 236* 153*   Lipid Profile: No results for input(s): CHOL, HDL, LDLCALC, TRIG, CHOLHDL, LDLDIRECT in the last 72 hours. Thyroid Function Tests: No results for input(s): TSH, T4TOTAL, FREET4, T3FREE, THYROIDAB in the last 72 hours. Anemia  Panel: No results for input(s): VITAMINB12, FOLATE, FERRITIN, TIBC, IRON, RETICCTPCT in the last 72 hours. Sepsis Labs:  Recent Labs Lab 10/05/16 1548  LATICACIDVEN 2.20*    Recent Results (from the past 240 hour(s))  Culture, blood (Routine x 2)     Status: None   Collection Time: 10/05/16  3:34 PM  Result Value Ref Range Status   Specimen Description BLOOD RIGHT ANTECUBITAL  Final   Special Requests IN PEDIATRIC BOTTLE Blood Culture adequate volume  Final   Culture NO GROWTH 5 DAYS  Final   Report Status 10/10/2016 FINAL  Final  Culture, blood (Routine x 2)     Status: None   Collection Time: 10/05/16  3:35 PM  Result Value Ref Range Status   Specimen Description BLOOD LEFT ANTECUBITAL  Final   Special Requests   Final    BOTTLES DRAWN AEROBIC AND ANAEROBIC Blood Culture adequate volume   Culture NO GROWTH 5 DAYS  Final   Report Status 10/10/2016 FINAL  Final  Culture, Urine     Status: Abnormal   Collection Time: 10/05/16  6:00 PM  Result Value Ref  Range Status   Specimen Description URINE, CLEAN CATCH  Final   Special Requests NONE  Final   Culture (A)  Final    60,000 COLONIES/mL PROTEUS MIRABILIS 20,000 COLONIES/mL ENTEROCOCCUS FAECALIS    Report Status 10/08/2016 FINAL  Final   Organism ID, Bacteria PROTEUS MIRABILIS (A)  Final   Organism ID, Bacteria ENTEROCOCCUS FAECALIS (A)  Final      Susceptibility   Enterococcus faecalis - MIC*    AMPICILLIN <=2 SENSITIVE Sensitive     LEVOFLOXACIN 1 SENSITIVE Sensitive     NITROFURANTOIN <=16 SENSITIVE Sensitive     VANCOMYCIN 1 SENSITIVE Sensitive     * 20,000 COLONIES/mL ENTEROCOCCUS FAECALIS   Proteus mirabilis - MIC*    AMPICILLIN <=2 SENSITIVE Sensitive     CEFAZOLIN <=4 SENSITIVE Sensitive     CEFTRIAXONE <=1 SENSITIVE Sensitive     CIPROFLOXACIN >=4 RESISTANT Resistant     GENTAMICIN 8 INTERMEDIATE Intermediate     IMIPENEM 4 SENSITIVE Sensitive     NITROFURANTOIN 128 RESISTANT Resistant     TRIMETH/SULFA >=320 RESISTANT Resistant     AMPICILLIN/SULBACTAM <=2 SENSITIVE Sensitive     PIP/TAZO <=4 SENSITIVE Sensitive     * 60,000 COLONIES/mL PROTEUS MIRABILIS         Radiology Studies: Dg Chest 2 View  Result Date: 10/10/2016 CLINICAL DATA:  Dyspnea EXAM: CHEST  2 VIEW COMPARISON:  Portable chest x-ray of October 05, 2016 FINDINGS: The lungs are mildly hyperinflated but clear. The heart and pulmonary vascularity are normal. There is calcification in the wall of the aortic arch. There is no pleural effusion. The bony thorax exhibits no acute abnormality. There are degenerative changes of both shoulders. IMPRESSION: Hyperinflation consistent with chronic bronchitis. No CHF nor pneumonia. Thoracic aortic atherosclerosis. Electronically Signed   By: David  Martinique M.D.   On: 10/10/2016 09:47        Scheduled Meds: . amiodarone  200 mg Oral Daily  . amLODipine  10 mg Oral Daily  . amoxicillin-clavulanate  1 tablet Oral BID  . apixaban  2.5 mg Oral BID  .  atorvastatin  20 mg Oral q1800  . docusate sodium  100 mg Oral BID  . feeding supplement (ENSURE ENLIVE)  237 mL Oral TID BM  . gabapentin  300 mg Oral QHS  . insulin aspart  0-9 Units Subcutaneous TID WC  .  insulin glargine  4 Units Subcutaneous QHS  . pantoprazole  40 mg Oral BID  . polyethylene glycol  17 g Oral Daily  . potassium chloride  40 mEq Oral BID  . sucralfate  1 g Oral TID WC & HS  . tamsulosin  0.4 mg Oral QPC breakfast   Continuous Infusions: . sodium chloride 50 mL/hr at 10/07/16 0513     LOS: 6 days        Domenic Polite, MD [facility] Page via [email or web address] password [email or web address]  After 7pm,  please contact night-coverage 10/11/2016, 2:27 PM

## 2016-10-11 NOTE — Progress Notes (Signed)
I have reviewed this case in detail and discussed with attending provider. Currently there is no available medical surrogate decision maker and the patient's condition continues to deteriorate. She is most appropriate for comfort care transition. Her condition is irreversible and terminal.  Lane Hacker, DO Palliative Medicine

## 2016-10-11 NOTE — Progress Notes (Signed)
Patient spit some crushed meds with pudding and took a small sip of powdered potassium mixed with ensure.  Will attempt to try another sip of potassium later.

## 2016-10-11 NOTE — Progress Notes (Addendum)
Physical Therapy Treatment Patient Details Name: Sherry [name] MRN: [id number] DOB: January 20, 1927 Today's Date: 10/11/2016    History of Present Illness Pt admitted from SNF where she was receiving rehab with PNA. Recent admission ot [facility] with severe hypoglycemia, hemataemesis. PMH: HTN, DM, CKD, PE, ?dementia.     PT Comments    Pt continued to be unable to participate in therapy secondary to lethargy and dementia. Pt is transitioning to comfort care. She is unable to follow commands and family has not been present for education. Discussed pt's lack of progress with PT and have decided to discharge pt from PT. Please reorder of pt's condition changes.    Follow Up Recommendations  SNF     Equipment Recommendations  None recommended by PT    Recommendations for Other Services       Precautions / Restrictions Precautions Precautions: Fall Restrictions Weight Bearing Restrictions: No    Mobility  Bed Mobility Overal bed mobility: Needs Assistance Bed Mobility: Sidelying to Sit;Sit to Sidelying   Sidelying to sit: Max assist     Sit to sidelying: Max assist General bed mobility comments: Pt assisted with trunk elevation and held therapist's hand during mobilities. Total assist required to progress LEs off EOB. Pt initiated return to side laying after sitting EOB, however still required max assist to return to bed.  Transfers                 General transfer comment: Pt too lethargic to safely attempt  Ambulation/Gait             General Gait Details: unable   Stairs            Wheelchair Mobility    Modified Rankin (Stroke Patients Only)       Balance Overall balance assessment: Needs assistance Sitting-balance support: Feet supported;Bilateral upper extremity supported Sitting balance-Leahy Scale: Poor Sitting balance - Comments: pt using BIL UEs to assist in maintaining upright position. Sat EOB for ~5 min with assist from PTA to remain  upright. Pts trunk flexed as she fatigued. Verbal and tactile cues required to keep pts head forward. Pt initiated return to sidelaying once fatigued and no longer willing to participate.   Standing balance support: Bilateral upper extremity supported Standing balance-Leahy Scale: Zero Standing balance comment: unable to stand today                            Cognition Arousal/Alertness: Lethargic Behavior During Therapy: Flat affect Overall Cognitive Status: Difficult to assess Area of Impairment: Orientation;Memory                 Orientation Level: Disoriented to;Place;Time;Situation   Memory: Decreased short-term memory         General Comments: Not participating with therapy. Pt did open eyes to touch in beginning of session. Eyes remained open through session but pt did not focus on therapist. Pt gave head nods in response to questions and only vocalized once to give her name      Exercises      General Comments General comments (skin integrity, edema, etc.): Pt continues to have low to no participation in therapy.      Pertinent Vitals/Pain Pain Assessment: No/denies pain Pain Intervention(s): Limited activity within patient's tolerance    Home Living                      Prior Function  PT Goals (current goals can now be found in the care plan section) Acute Rehab PT Goals Patient Stated Goal: not stated PT Goal Formulation: Patient unable to participate in goal setting Time For Goal Achievement: 10/21/16 Potential to Achieve Goals: Fair Progress towards PT goals: Not progressing toward goals - comment (pt lethargic and not participating in therapy)    Frequency    Min 2X/week      PT Plan Other (comment) (need to d/c from PT)    Co-evaluation              AM-PAC PT "6 Clicks" Daily Activity  Outcome Measure  Difficulty turning over in bed (including adjusting bedclothes, sheets and blankets)?: A  Little Difficulty moving from lying on back to sitting on the side of the bed? : Unable Difficulty sitting down on and standing up from a chair with arms (e.g., wheelchair, bedside commode, etc,.)?: Unable Help needed moving to and from a bed to chair (including a wheelchair)?: Total Help needed walking in hospital room?: Total Help needed climbing 3-5 steps with a railing? : Total 6 Click Score: 8    End of Session Equipment Utilized During Treatment: Gait belt Activity Tolerance: Patient limited by lethargy;Other (comment) (dementia) Patient left: in bed;with bed alarm set;with call bell/phone within reach Nurse Communication: Mobility status PT Visit Diagnosis: Other abnormalities of gait and mobility (R26.89);Muscle weakness (generalized) (M62.81)     Time: 0076-2263 PT Time Calculation (min) (ACUTE ONLY): 12 min  Charges:  $Therapeutic Activity: 8-22 mins                    G Codes:       Benjiman Core, [place] Pager [phone] Acute Rehab   Allena Katz 10/11/2016, 1:59 PM

## 2016-10-11 NOTE — Progress Notes (Signed)
CSW following to assist with support and discharge planning. CSW alerted by [facility] and MD to try to determine what the patient's facility, [facility], would do in difficult medical decisions when the patient's family is unable to be reached. CSW left voicemails for admissions coordinator at [facility], as well as their sister facility, [facility], to try to obtain information on facility policies and how they would handle the patient's care at their facility; awaiting call back.  CSW also to follow up with DSS on what to do in the meantime while they are running the case for guardianship. CSW will update medical team with information when received.  Laveda Abbe, [place] Clinical Social Worker [phone]

## 2016-10-11 NOTE — Progress Notes (Signed)
Physical Therapy Discharge Patient Details Name: [name] MRN: [id number] DOB: 11-19-1927 Today's Date: 10/11/2016 Time: 9983-3825 PT Time Calculation (min) (ACUTE ONLY): 12 min  Patient discharged from PT services secondary to medical decline - will need to re-order PT to resume therapy services. Pt unable to participate in therapy at this time secondary to lethargy and dementia. No family present to provide education. Comfort care has been called..  Please see latest therapy progress note for current level of functioning and progress toward goals.    Progress and discharge plan discussed with patient and/or caregiver: Patient unable to participate in discharge planning and no caregivers available  Benjiman Core, [place] Pager [phone] Acute Rehab     Allena Katz 10/11/2016, 1:50 PM

## 2016-10-11 NOTE — Progress Notes (Signed)
Nutrition Brief Note  Chart reviewed. Case discussed with RN. Pt not eating well off meal trays, but RN would like to provide pt Ensure supplements for comfort. RD to order.  Per palliative care notes, pt transitioning to comfort care.  No further nutrition interventions warranted at this time.  Please re-consult as needed.   Sherry [name] A. Jimmye Norman, RD, LDN, CDE Pager: [phone] After hours Pager: [phone]

## 2016-10-12 DIAGNOSIS — R112 Nausea with vomiting, unspecified: Secondary | ICD-10-CM | POA: Diagnosis not present

## 2016-10-12 DIAGNOSIS — N183 Chronic kidney disease, stage 3 (moderate): Secondary | ICD-10-CM | POA: Diagnosis not present

## 2016-10-12 DIAGNOSIS — J181 Lobar pneumonia, unspecified organism: Secondary | ICD-10-CM | POA: Diagnosis not present

## 2016-10-12 DIAGNOSIS — I10 Essential (primary) hypertension: Secondary | ICD-10-CM | POA: Diagnosis not present

## 2016-10-12 LAB — GLUCOSE, CAPILLARY
GLUCOSE-CAPILLARY: 179 mg/dL — AB (ref 65–99)
GLUCOSE-CAPILLARY: 224 mg/dL — AB (ref 65–99)
Glucose-Capillary: 137 mg/dL — ABNORMAL HIGH (ref 65–99)
Glucose-Capillary: 97 mg/dL (ref 65–99)

## 2016-10-12 MED ORDER — POTASSIUM CHLORIDE CRYS ER 20 MEQ PO TBCR
40.0000 meq | EXTENDED_RELEASE_TABLET | Freq: Two times a day (BID) | ORAL | Status: DC
  Administered 2016-10-13: 40 meq via ORAL
  Filled 2016-10-12 (×7): qty 2

## 2016-10-12 NOTE — Progress Notes (Addendum)
CSW spoke with patient DSS worker- Otilio [phone]- who states patient is normally involved in her decision making so they have not attempted to pursue any guardianship for her.  State that patient continues to have an open case against patient children for exploitation and neglect.  MD does not believe patient has capacity to make decisions at this time so would need family to make decision or guardianship be started- DSS worker states this would take them 2 weeks to do and no way to expedite process.  Attempting again to contact family- if they are reachable and willing to make decisions we can still use them as decision makers as long as they are making appropriate decisions- APS main concern is that patient does not return home.  CSW will continue to follow  Jorge Ny, LCSW Clinical Social Worker [phone]

## 2016-10-12 NOTE — Progress Notes (Signed)
Patient ID: Sherry [name], female   DOB: 17-Aug-1927, 83 y.o.   MRN: [id number]  PROGRESS NOTE    SHARLENE MCCLUSKEY  KGU:[id number] DOB: 14-Jun-1927 DOA: 10/05/2016 PCP: [name], MD   Brief Narrative:  83 y.o. female with medical history significant of hypertension, diabetes, chronic kidney disease, pulmonary embolism on oral anticoagulation, hyperlipidemia, suspected dementia and recent admission and discharge on 09/19/2016 at [facility] for acute encephalopathy due to severe hypoglycemia and hematemesis with possible upper GI bleed was sent from nursing home for altered mental status. She was admitted with probable pneumonia. Her overall condition is not improving. Palliative care consultation was called. Pt refuses to eat or drink anything, refuses her meds  Assessment & Plan:  Probable healthcare associated pneumonia/atelectasis vs pneumonia noted on admission CXR - blood cultures negative  - Repeat chest x-ray shows no evidence of infiltrates - was on IV Abx and then changed to PO Augmentin-she continues to refuse all meds - Urine streptococcal antigen was positive.   - SLP evaluation and diet as per SLP recommendations. Patient is NOT eating anything and refuses all medications.  Probable UTI - Urine culture is growing enterococcus and Proteus. Antibiotic plan as above  Altered mental status with probable acute metabolic encephalopathy - Patient's baseline mental status is unknown; patient might have some degree of dementia but nods yes or no and clearly refuses and says " No" when offered any meds or food - She is awake intermittently , she has been refusing to engage in any kind of conversation - today alert to self and partly to place - under these circumstances she doesn't have the capacity to make any decisions for herself and since family is unreachable despite multiple attempts for a week, DSS has been asked to look into guardianship  Nausea and vomiting - on  admission, resolved - Questionable cause. Might be related to pneumonia. - CT abdomen did not show any obstruction.  Chronic kidney disease stage III - creatinine stable, no value in repeat blood draws  History of pulmonary embolism - Continue anticoagulation with Eliquis, however mostly refuses most meds at this time  Probable chronic atrial fibrillation - Continue anticoagulation. Continue amiodarone. Intermittently  bradycardic  Hypertension - monitor blood pressure. Use amlodipine if able to tolerate oral.  Diabetes mellitus type 2 with hyperglycemia - Accu-Cheks with insulin sliding scale coverage - continue Lantus  Generalized deconditioning/Failure to thrive/ severe malnutrition and refusal to take meds and Po intake - We have tried to reach family members on numerous occasions over 1 week, unable to reach any family members to make any decisions regarding [name] - Palliative care consult for goals of care. Overall prognosis is very poor and Po intake is None at this time -in her current situation with refusal to eat anything, take any medications, doing anything aggressive would be futile -My recommendations are for comfort focused care -appreciate Palliative input per Dr.Golding -CSW working to contact SNF and see if they have some line of communication with family -I called both son and daughters numbers on facesheet -unable to reach anyone -CSW to contact DSS to figure out next plan for guardianship  Hypokalemia and hypomagnesemia - stopped daily labs at this time  Right renal mass - CT abdomen still shows right renal mass.  - urology consult appreciated, could be transitional cell CA - No further workup suggested at this time because of overall poor prognosis.  DVT prophylaxis: Eliquis Code Status:  DO NOT RESUSCITATE Family  Communication: None at bedside Disposition Plan: unable to plan dispo without contact with family or court appointed  guardian  Consultants: Palliative care; urology  Procedures: None  Antimicrobials: Cefepime on 10/05/2016 and 10/06/2016 Vancomycin from 10/05/2016-10/06/2016 augmentin was ordered on 10/08/2016 but patient is not taking it  Subjective: -refuses any PO meds -refuses food  Objective: Vitals:   10/12/16 0440 10/12/16 0830 10/12/16 1139 10/12/16 1419  BP: (!) 128/47 (!) 137/41 (!) 140/52 (!) 125/45  Pulse: 80 78 78 79  Resp: 16 16 16 14   Temp: 98.5 F (36.9 C) (!) 97.4 F (36.3 C) 97.7 F (36.5 C) 98.2 F (36.8 C)  TempSrc:  Oral Oral Oral  SpO2: 100% 100% 100% 99%  Weight:      Height:        Intake/Output Summary (Last 24 hours) at 10/12/16 1459 Last data filed at 10/12/16 0749  Gross per 24 hour  Intake               62 ml  Output                0 ml  Net               62 ml   Filed Weights   10/05/16 2049  Weight: 48.7 kg (107 lb 4.8 oz)    Examination:  Gen: frail, thinly built debilitated female, mumbles and nods yes and no CVS: S1S2/RRR Lungs: scattered ronchi at bases Abd: soft, NT, BS present Ext: no edema Skin: no rashes   Data Reviewed: I have personally reviewed following labs and imaging studies  CBC:  Recent Labs Lab 10/05/16 1535 10/06/16 0448 10/07/16 1411 10/08/16 0929  WBC 3.6* 7.0 6.1 6.2  NEUTROABS 2.8  --  5.1 4.9  HGB 10.9* 10.3* 10.4* 11.3*  HCT 34.3* 32.5* 31.3* 34.7*  MCV 84.9 83.8 82.2 82.4  PLT 394 394 345 409   Basic Metabolic Panel:  Recent Labs Lab 10/05/16 1535 10/06/16 0448 10/07/16 1411 10/08/16 0654  NA 140 142 144 145  K 3.3* 4.2 2.3* 2.3*  CL 105 112* 115* 113*  CO2 12* 16* 17* 19*  GLUCOSE 294* 249* 275* 267*  BUN 11 10 10 8   CREATININE 1.38* 1.20* 1.41* 1.25*  CALCIUM 8.8* 8.3* 8.7* 8.8*  MG  --   --  1.6* 1.6*   GFR: Estimated Creatinine Clearance: 24.4 mL/min (A) (by C-G formula based on SCr of 1.25 mg/dL (H)). Liver Function Tests:  Recent Labs Lab 10/05/16 1535 10/06/16 0448  AST 17  42*  ALT 11* 11*  ALKPHOS 73 60  BILITOT 1.3* 2.8*  PROT 6.5 5.4*  ALBUMIN 3.0* 2.7*   No results for input(s): LIPASE, AMYLASE in the last 168 hours. No results for input(s): AMMONIA in the last 168 hours. Coagulation Profile:  Recent Labs Lab 10/05/16 1535  INR 0.98   Cardiac Enzymes: No results for input(s): CKTOTAL, CKMB, CKMBINDEX, TROPONINI in the last 168 hours. BNP (last 3 results) No results for input(s): PROBNP in the last 8760 hours. HbA1C: No results for input(s): HGBA1C in the last 72 hours. CBG:  Recent Labs Lab 10/11/16 1212 10/11/16 1713 10/11/16 2126 10/12/16 0810 10/12/16 1218  GLUCAP 153* 174* 169* 179* 224*   Lipid Profile: No results for input(s): CHOL, HDL, LDLCALC, TRIG, CHOLHDL, LDLDIRECT in the last 72 hours. Thyroid Function Tests: No results for input(s): TSH, T4TOTAL, FREET4, T3FREE, THYROIDAB in the last 72 hours. Anemia Panel: No results for input(s): VITAMINB12, FOLATE, FERRITIN,  TIBC, IRON, RETICCTPCT in the last 72 hours. Sepsis Labs:  Recent Labs Lab 10/05/16 1548  LATICACIDVEN 2.20*    Recent Results (from the past 240 hour(s))  Culture, blood (Routine x 2)     Status: None   Collection Time: 10/05/16  3:34 PM  Result Value Ref Range Status   Specimen Description BLOOD RIGHT ANTECUBITAL  Final   Special Requests IN PEDIATRIC BOTTLE Blood Culture adequate volume  Final   Culture NO GROWTH 5 DAYS  Final   Report Status 10/10/2016 FINAL  Final  Culture, blood (Routine x 2)     Status: None   Collection Time: 10/05/16  3:35 PM  Result Value Ref Range Status   Specimen Description BLOOD LEFT ANTECUBITAL  Final   Special Requests   Final    BOTTLES DRAWN AEROBIC AND ANAEROBIC Blood Culture adequate volume   Culture NO GROWTH 5 DAYS  Final   Report Status 10/10/2016 FINAL  Final  Culture, Urine     Status: Abnormal   Collection Time: 10/05/16  6:00 PM  Result Value Ref Range Status   Specimen Description URINE, CLEAN CATCH   Final   Special Requests NONE  Final   Culture (A)  Final    60,000 COLONIES/mL PROTEUS MIRABILIS 20,000 COLONIES/mL ENTEROCOCCUS FAECALIS    Report Status 10/08/2016 FINAL  Final   Organism ID, Bacteria PROTEUS MIRABILIS (A)  Final   Organism ID, Bacteria ENTEROCOCCUS FAECALIS (A)  Final      Susceptibility   Enterococcus faecalis - MIC*    AMPICILLIN <=2 SENSITIVE Sensitive     LEVOFLOXACIN 1 SENSITIVE Sensitive     NITROFURANTOIN <=16 SENSITIVE Sensitive     VANCOMYCIN 1 SENSITIVE Sensitive     * 20,000 COLONIES/mL ENTEROCOCCUS FAECALIS   Proteus mirabilis - MIC*    AMPICILLIN <=2 SENSITIVE Sensitive     CEFAZOLIN <=4 SENSITIVE Sensitive     CEFTRIAXONE <=1 SENSITIVE Sensitive     CIPROFLOXACIN >=4 RESISTANT Resistant     GENTAMICIN 8 INTERMEDIATE Intermediate     IMIPENEM 4 SENSITIVE Sensitive     NITROFURANTOIN 128 RESISTANT Resistant     TRIMETH/SULFA >=320 RESISTANT Resistant     AMPICILLIN/SULBACTAM <=2 SENSITIVE Sensitive     PIP/TAZO <=4 SENSITIVE Sensitive     * 60,000 COLONIES/mL PROTEUS MIRABILIS         Radiology Studies: No results found.      Scheduled Meds: . amiodarone  200 mg Oral Daily  . amLODipine  10 mg Oral Daily  . amoxicillin-clavulanate  1 tablet Oral BID  . apixaban  2.5 mg Oral BID  . atorvastatin  20 mg Oral q1800  . docusate sodium  100 mg Oral BID  . feeding supplement (ENSURE ENLIVE)  237 mL Oral TID BM  . gabapentin  300 mg Oral QHS  . insulin aspart  0-9 Units Subcutaneous TID WC  . insulin glargine  4 Units Subcutaneous QHS  . pantoprazole  40 mg Oral BID  . polyethylene glycol  17 g Oral Daily  . potassium chloride SA  40 mEq Oral BID  . sucralfate  1 g Oral TID WC & HS  . tamsulosin  0.4 mg Oral QPC breakfast   Continuous Infusions: . sodium chloride 50 mL/hr at 10/07/16 0513     LOS: 7 days        Domenic Polite, MD [facility] Page via [email or web address] password [email or web address]  After 7pm,  please contact  night-coverage 10/12/2016, 2:59 PM

## 2016-10-12 NOTE — Progress Notes (Signed)
Patient refused all medications including Lantus insulin this evening. She did ask for Sprite and crackers. I gave her Sprite and graham crackers with peanut butter. She stated that she just wanted to go home.  I told her that it was too late in the evening for her to go home and that she would need to see the doctor before she could go home.  I explained that she needed to take her meds so she could get better and go home,but she still refused all medications.

## 2016-10-12 NOTE — Progress Notes (Addendum)
OT Cancellation Note and Discharge  Patient Details Name: Sherry [name] MRN: [id number] DOB: 21-Nov-1927   Cancelled Treatment:    Reason Eval/Treat Not Completed:  (Medical decline, unable to participate in therapy. ) Per palliative note, pt is most appropriate for comfort care as her condition is terminal. Will discharge.  [name] 10/12/2016, 8:56 AM  10/12/2016 Nestor Lewandowsky, OTR/L Pager: [phone]

## 2016-10-13 DIAGNOSIS — I10 Essential (primary) hypertension: Secondary | ICD-10-CM | POA: Diagnosis not present

## 2016-10-13 DIAGNOSIS — J181 Lobar pneumonia, unspecified organism: Secondary | ICD-10-CM | POA: Diagnosis not present

## 2016-10-13 DIAGNOSIS — R112 Nausea with vomiting, unspecified: Secondary | ICD-10-CM | POA: Diagnosis not present

## 2016-10-13 DIAGNOSIS — N183 Chronic kidney disease, stage 3 (moderate): Secondary | ICD-10-CM | POA: Diagnosis not present

## 2016-10-13 LAB — GLUCOSE, CAPILLARY
GLUCOSE-CAPILLARY: 164 mg/dL — AB (ref 65–99)
GLUCOSE-CAPILLARY: 207 mg/dL — AB (ref 65–99)
GLUCOSE-CAPILLARY: 60 mg/dL — AB (ref 65–99)
Glucose-Capillary: 42 mg/dL — CL (ref 65–99)
Glucose-Capillary: 138 mg/dL — ABNORMAL HIGH (ref 65–99)

## 2016-10-13 MED ORDER — GABAPENTIN 100 MG PO CAPS
200.0000 mg | ORAL_CAPSULE | Freq: Every day | ORAL | Status: DC
  Filled 2016-10-13: qty 2

## 2016-10-13 MED ORDER — DEXTROSE 50 % IV SOLN
1.0000 | Freq: Once | INTRAVENOUS | Status: AC
  Administered 2016-10-13: 50 mL via INTRAVENOUS
  Filled 2016-10-13: qty 50

## 2016-10-13 NOTE — Progress Notes (Addendum)
Patient ID: Sherry [name], female   DOB: 07-02-1927, 83 y.o.   MRN: [id number]  PROGRESS NOTE    Sherry [name]  VFI:[id number] DOB: 12/09/1927 DOA: 10/05/2016 PCP: [name], MD   Brief Narrative:  83 y.o. female with medical history significant of hypertension, diabetes, chronic kidney disease, pulmonary embolism on oral anticoagulation, hyperlipidemia, suspected dementia and recent admission and discharge on 09/19/2016 at [facility] for acute encephalopathy due to severe hypoglycemia and hematemesis with possible upper GI bleed was sent from nursing home for altered mental status. She was admitted with probable pneumonia. Her overall condition is not improving. Palliative care consultation was called. Pt refuses to eat or drink anything, refuses her meds  Assessment & Plan:  Probable healthcare associated pneumonia/atelectasis vs pneumonia noted on admission CXR - blood cultures negative  - Repeat chest x-ray shows no evidence of infiltrates - was on IV Abx and then changed to PO Augmentin-she continues to refuse all meds, hence stopped augmentin - Urine streptococcal antigen was positive.   - SLP evaluation and diet as per SLP recommendations. Patient is NOT eating anything and refuses all medications.  Probable UTI - Urine culture is growing enterococcus and Proteus. Antibiotic plan as above  Altered mental status with probable acute metabolic encephalopathy - Patient was making her decisions at SNF prior to admission; patient might have some degree of dementia, answers some questions, nods yes or no and clearly refuses and says " No" when offered any meds or food - She is awake intermittently , she has been intermittently refusing to engage in any kind of conversation, now more alert and awake, keeps saying that she wants to go back to [place] - under these circumstances she doesn't have the capacity to make any decisions for herself and since family is unreachable  despite multiple attempts for a week, DSS has been asked to look into guardianship. Per CSW, APS has an open case regarding Elder Neglect against family.  Nausea and vomiting - on admission, resolved - CT abdomen did not show any obstruction.  Chronic kidney disease stage III - creatinine stable, no value in repeat blood draws  History of pulmonary embolism - Continue anticoagulation with Eliquis, however mostly refuses most meds at this time  Probable chronic atrial fibrillation - Continue anticoagulation. Continue amiodarone. Intermittently  bradycardic  Hypertension - monitor blood pressure. Use amlodipine if able to tolerate oral.  Diabetes mellitus type 2 with hyperglycemia - Accu-Cheks with insulin sliding scale coverage - continue Lantus  Generalized deconditioning/Failure to thrive/ severe malnutrition and refusal to take meds and Po intake - We have tried to reach family members on numerous occasions over 1 week, unable to reach any family members to make any decisions regarding Sherry [name] - Palliative care consult for goals of care. Overall prognosis is very poor and Po intake is None at this time -in her current situation with refusal to eat anything, take any medications, doing anything aggressive would be futile -My recommendations are for comfort focused care -appreciate Palliative input per Dr.Golding -CSW contacted DSS to figure out next plan for guardianship  Hypokalemia and hypomagnesemia - stopped daily labs at this time  Right renal mass - CT abdomen still shows right renal mass.  - urology consult appreciated, could be transitional cell CA - No further workup suggested at this time because of overall poor prognosis.  DVT prophylaxis: Eliquis Code Status:  DO NOT RESUSCITATE Family Communication: None at bedside Disposition Plan: unable to plan dispo  without contact with family or court appointed guardian  Consultants: Palliative care;  urology  Procedures: None  Antimicrobials: Cefepime on 10/05/2016 and 10/06/2016 Vancomycin from 10/05/2016-10/06/2016 augmentin was ordered on 10/08/2016 but patient is not taking it  Subjective: -refuses Po meds  Objective: Vitals:   10/12/16 1139 10/12/16 1419 10/12/16 2111 10/13/16 0514  BP: (!) 140/52 (!) 125/45 (!) 125/42 (!) 129/50  Pulse: 78 79 82 76  Resp: 16 14 16 15   Temp: 97.7 F (36.5 C) 98.2 F (36.8 C) 98.7 F (37.1 C) 97.7 F (36.5 C)  TempSrc: Oral Oral Oral Oral  SpO2: 100% 99% 99% 100%  Weight:      Height:        Intake/Output Summary (Last 24 hours) at 10/13/16 1428 Last data filed at 10/13/16 0932  Gross per 24 hour  Intake               60 ml  Output                0 ml  Net               60 ml   Filed Weights   10/05/16 2049  Weight: 48.7 kg (107 lb 4.8 oz)    Examination:  Gen: frail, thinly built ability of female, alert this morning, oriented to self only HEENT: PERRLA, Neck supple, no JVD Lungs: scattered rhonchi at the bases CVS: RRR,No Gallops,Rubs or new Murmurs Abd: soft, Non tender, non distended, BS present Extremities: No Cyanosis, Clubbing or edema Skin: no new rashes   Data Reviewed: I have personally reviewed following labs and imaging studies  CBC:  Recent Labs Lab 10/07/16 1411 10/08/16 0929  WBC 6.1 6.2  NEUTROABS 5.1 4.9  HGB 10.4* 11.3*  HCT 31.3* 34.7*  MCV 82.2 82.4  PLT 345 329   Basic Metabolic Panel:  Recent Labs Lab 10/07/16 1411 10/08/16 0654  NA 144 145  K 2.3* 2.3*  CL 115* 113*  CO2 17* 19*  GLUCOSE 275* 267*  BUN 10 8  CREATININE 1.41* 1.25*  CALCIUM 8.7* 8.8*  MG 1.6* 1.6*   GFR: Estimated Creatinine Clearance: 24.4 mL/min (A) (by C-G formula based on SCr of 1.25 mg/dL (H)). Liver Function Tests: No results for input(s): AST, ALT, ALKPHOS, BILITOT, PROT, ALBUMIN in the last 168 hours. No results for input(s): LIPASE, AMYLASE in the last 168 hours. No results for input(s):  AMMONIA in the last 168 hours. Coagulation Profile: No results for input(s): INR, PROTIME in the last 168 hours. Cardiac Enzymes: No results for input(s): CKTOTAL, CKMB, CKMBINDEX, TROPONINI in the last 168 hours. BNP (last 3 results) No results for input(s): PROBNP in the last 8760 hours. HbA1C: No results for input(s): HGBA1C in the last 72 hours. CBG:  Recent Labs Lab 10/12/16 1218 10/12/16 1710 10/12/16 2237 10/13/16 0729 10/13/16 1230  GLUCAP 224* 137* 97 164* 207*   Lipid Profile: No results for input(s): CHOL, HDL, LDLCALC, TRIG, CHOLHDL, LDLDIRECT in the last 72 hours. Thyroid Function Tests: No results for input(s): TSH, T4TOTAL, FREET4, T3FREE, THYROIDAB in the last 72 hours. Anemia Panel: No results for input(s): VITAMINB12, FOLATE, FERRITIN, TIBC, IRON, RETICCTPCT in the last 72 hours. Sepsis Labs: No results for input(s): PROCALCITON, LATICACIDVEN in the last 168 hours.  Recent Results (from the past 240 hour(s))  Culture, blood (Routine x 2)     Status: None   Collection Time: 10/05/16  3:34 PM  Result Value Ref Range Status  Specimen Description BLOOD RIGHT ANTECUBITAL  Final   Special Requests IN PEDIATRIC BOTTLE Blood Culture adequate volume  Final   Culture NO GROWTH 5 DAYS  Final   Report Status 10/10/2016 FINAL  Final  Culture, blood (Routine x 2)     Status: None   Collection Time: 10/05/16  3:35 PM  Result Value Ref Range Status   Specimen Description BLOOD LEFT ANTECUBITAL  Final   Special Requests   Final    BOTTLES DRAWN AEROBIC AND ANAEROBIC Blood Culture adequate volume   Culture NO GROWTH 5 DAYS  Final   Report Status 10/10/2016 FINAL  Final  Culture, Urine     Status: Abnormal   Collection Time: 10/05/16  6:00 PM  Result Value Ref Range Status   Specimen Description URINE, CLEAN CATCH  Final   Special Requests NONE  Final   Culture (A)  Final    60,000 COLONIES/mL PROTEUS MIRABILIS 20,000 COLONIES/mL ENTEROCOCCUS FAECALIS    Report  Status 10/08/2016 FINAL  Final   Organism ID, Bacteria PROTEUS MIRABILIS (A)  Final   Organism ID, Bacteria ENTEROCOCCUS FAECALIS (A)  Final      Susceptibility   Enterococcus faecalis - MIC*    AMPICILLIN <=2 SENSITIVE Sensitive     LEVOFLOXACIN 1 SENSITIVE Sensitive     NITROFURANTOIN <=16 SENSITIVE Sensitive     VANCOMYCIN 1 SENSITIVE Sensitive     * 20,000 COLONIES/mL ENTEROCOCCUS FAECALIS   Proteus mirabilis - MIC*    AMPICILLIN <=2 SENSITIVE Sensitive     CEFAZOLIN <=4 SENSITIVE Sensitive     CEFTRIAXONE <=1 SENSITIVE Sensitive     CIPROFLOXACIN >=4 RESISTANT Resistant     GENTAMICIN 8 INTERMEDIATE Intermediate     IMIPENEM 4 SENSITIVE Sensitive     NITROFURANTOIN 128 RESISTANT Resistant     TRIMETH/SULFA >=320 RESISTANT Resistant     AMPICILLIN/SULBACTAM <=2 SENSITIVE Sensitive     PIP/TAZO <=4 SENSITIVE Sensitive     * 60,000 COLONIES/mL PROTEUS MIRABILIS         Radiology Studies: No results found.      Scheduled Meds: . amiodarone  200 mg Oral Daily  . amLODipine  10 mg Oral Daily  . amoxicillin-clavulanate  1 tablet Oral BID  . apixaban  2.5 mg Oral BID  . atorvastatin  20 mg Oral q1800  . docusate sodium  100 mg Oral BID  . feeding supplement (ENSURE ENLIVE)  237 mL Oral TID BM  . gabapentin  300 mg Oral QHS  . insulin aspart  0-9 Units Subcutaneous TID WC  . insulin glargine  4 Units Subcutaneous QHS  . pantoprazole  40 mg Oral BID  . polyethylene glycol  17 g Oral Daily  . potassium chloride SA  40 mEq Oral BID  . sucralfate  1 g Oral TID WC & HS  . tamsulosin  0.4 mg Oral QPC breakfast   Continuous Infusions:    LOS: 8 days        Domenic Polite, MD [facility] Page via [email or web address] password [email or web address]  After 7pm,  please contact night-coverage 10/13/2016, 2:28 PM

## 2016-10-13 NOTE — Progress Notes (Signed)
Pt refused all medicines this morning despite several attempts to persuade her

## 2016-10-13 NOTE — Progress Notes (Signed)
Pt visibly more lethargic and refusing diet. Takes small amounts of fluids but not enough to maintain therapeutic and hydration and blood sugars. CBG 42 this evening, pt encouraged to drink juice and sugar only went up to 62. MD paged and advised D50 IM

## 2016-10-13 NOTE — Progress Notes (Signed)
[facility] continues to want patient to have decision maker prior to admission- they spoke directly with APS worker who will be able to petition for emergency guardianship on Monday  CSW attempted all family phone numbers will no success at this time- CSW will continue to follow  Jorge Ny, [place] Social Worker [phone]

## 2016-10-14 LAB — GLUCOSE, CAPILLARY
GLUCOSE-CAPILLARY: 173 mg/dL — AB (ref 65–99)
GLUCOSE-CAPILLARY: 244 mg/dL — AB (ref 65–99)
GLUCOSE-CAPILLARY: 241 mg/dL — AB (ref 65–99)
Glucose-Capillary: 215 mg/dL — ABNORMAL HIGH (ref 65–99)

## 2016-10-14 MED ORDER — NYSTATIN 100000 UNIT/ML MT SUSP
5.0000 mL | Freq: Four times a day (QID) | OROMUCOSAL | Status: DC
  Filled 2016-10-14 (×4): qty 5

## 2016-10-14 NOTE — Progress Notes (Signed)
Pt agreeable to taking a few small tablets. Refused the bigger pills including the potassium. Took eliquis with coffee

## 2016-10-14 NOTE — Progress Notes (Signed)
Pt has thrush, text to Dr. Broadus John for order.

## 2016-10-14 NOTE — Progress Notes (Signed)
No changes,see progress note from 10/5 -Social work, working with APS regarding petition for emergency guardianship -after that will plan discharge to skilled nursing facility with hospice services  Domenic Polite, MD

## 2016-10-15 LAB — GLUCOSE, CAPILLARY
GLUCOSE-CAPILLARY: 220 mg/dL — AB (ref 65–99)
GLUCOSE-CAPILLARY: 326 mg/dL — AB (ref 65–99)
GLUCOSE-CAPILLARY: 254 mg/dL — AB (ref 65–99)
Glucose-Capillary: 271 mg/dL — ABNORMAL HIGH (ref 65–99)

## 2016-10-15 NOTE — Progress Notes (Signed)
Took pt out of the dept at her request so she could see outside.  She really enjoyed this.  Talked to pt and she stated that she has 1 daughter, [name] and 1 son, [name] left from the 4 children she originally had (2 daughters and 2 sons originally).  She stated that she has not seen her children for 5 months.  She is liking coffee and sprite.

## 2016-10-15 NOTE — Progress Notes (Signed)
Pt refused all meds at this time. Educated on their use and importance. Continues to refuse.

## 2016-10-15 NOTE — Progress Notes (Signed)
No changes, remains stable, but refusing meals and meds, drinks few sips of water/juice/coffee -Social work, working with APS regarding petition for emergency guardianship -after that will plan discharge to skilled nursing facility with hospice services  Domenic Polite, MD

## 2016-10-15 NOTE — Progress Notes (Signed)
Pt likes looking out of the window so took outside dept so she could see outside.  Put in chair in front of window in room.  She drank a cup of tomato soup and tolerated it well.

## 2016-10-16 DIAGNOSIS — N183 Chronic kidney disease, stage 3 (moderate): Secondary | ICD-10-CM | POA: Diagnosis not present

## 2016-10-16 DIAGNOSIS — J181 Lobar pneumonia, unspecified organism: Secondary | ICD-10-CM | POA: Diagnosis not present

## 2016-10-16 DIAGNOSIS — I10 Essential (primary) hypertension: Secondary | ICD-10-CM | POA: Diagnosis not present

## 2016-10-16 LAB — GLUCOSE, CAPILLARY
Glucose-Capillary: 216 mg/dL — ABNORMAL HIGH (ref 65–99)
Glucose-Capillary: 223 mg/dL — ABNORMAL HIGH (ref 65–99)
Glucose-Capillary: 221 mg/dL — ABNORMAL HIGH (ref 65–99)
Glucose-Capillary: 208 mg/dL — ABNORMAL HIGH (ref 65–99)

## 2016-10-16 NOTE — Progress Notes (Signed)
Patient ID: Sherry [name], female   DOB: 05-May-1927, 83 y.o.   MRN: [id number]  PROGRESS NOTE    Sherry [name]  MVH:[id number] DOB: 01-23-1927 DOA: 10/05/2016 PCP: [name], MD   Brief Narrative:  83 y.o. female with medical history significant of hypertension, diabetes, chronic kidney disease, pulmonary embolism on oral anticoagulation, hyperlipidemia, suspected dementia and recent admission and discharge on 09/19/2016 at [facility] for acute encephalopathy due to severe hypoglycemia and hematemesis with possible upper GI bleed was sent from nursing home for altered mental status. She was admitted with probable pneumonia. Her overall condition is not improving. Palliative care consultation was called. Pt refuses to eat or drink anything, refuses her meds  Assessment & Plan:  Metabolic encephalopathy -possibly due to infections as noted below in the background of suspected senile dementia. - Patient was making her decisions at SNF prior to admission; patient might have some degree of dementia, answers some simple questions, nods yes or no and clearly refuses and says " No" when offered any meds or food at this time -mentation improved since admission but at this time, oriented to self and only partly to place and not to time - under these circumstances she doesn't have the capacity to make any decisions for herself and since family is unreachable despite multiple attempts for a week, CSW following and DSS has been asked to look into guardianship. Per CSW, APS has an open case regarding Elder Neglect against some family members -plan for DC to SNF with comfort focused care when guardianship obtained  Probable healthcare associated pneumonia/atelectasis vs pneumonia noted on admission CXR - blood cultures negative  - Repeat chest x-ray shows no evidence of infiltrates - was on IV Abx and then changed to PO Augmentin-she continues to refuse all meds, hence stopped augmentin -  Urine streptococcal antigen was positive.   - SLP evaluation completed, on dysphagia 1 diet, medications crushed with puree.  Probable UTI - Urine culture grew enterococcus and Proteus. -completed Rx  Right renal mass - CT abdomen still shows right renal mass.  - urology consult per Dr.Bell appreciated, could be transitional cell CA, per Urology too frail for any surgical intervention - No further workup suggested at this time because of overall poor prognosis.  Generalized deconditioning/Failure to thrive/ severe malnutrition and refusal to take meds and Po intake - We have tried to reach family members on numerous occasions over 1 week, unable to reach any family members to make any decisions regarding Sherry [name] - Palliative care consult for goals of care. Overall prognosis is very poor and Po intake is minimal at this time -in her current situation with refusal to eat anything, take any medications, doing anything aggressive would be futile -My recommendations are for comfort focused care -appreciate Palliative input per Dr.Golding, she recommended Comfort care -CSW following for guardianship  Nausea and vomiting - on admission, resolved - CT abdomen did not show any obstruction.  Chronic kidney disease stage III - creatinine stable, no value in repeat blood draws  History of pulmonary embolism - on chronic anticoagulation with Eliquis, however mostly refuses most meds at this time  Probable chronic atrial fibrillation - Continue anticoagulation. Continue amiodarone. Intermittently  bradycardic  Hypertension - stable for the most part  Diabetes mellitus type 2 with hyperglycemia and hypo - Accu-Cheks with insulin sliding scale coverage - stopped lantus due to intermittent hypoglycemia in setting of poor Po Intake  Hypokalemia and hypomagnesemia - stopped daily labs at  this time  DVT prophylaxis: Eliquis Code Status:  DO NOT RESUSCITATE Family Communication: None  at bedside Disposition Plan: unable to plan dispo without contact with family or court appointed guardian, once guardianship has been obtained will discharge to skilled nursing facility with recommendations for comfort focused care  Consultants: Palliative care; urology  Procedures: None  Antimicrobials: Cefepime on 10/05/2016 and 10/06/2016 Vancomycin from 10/05/2016-10/06/2016 augmentin was ordered on 10/08/2016 but patient is not taking it  Subjective: -continues to refuse medications and drinks a few sips of coffee a day  Objective: Vitals:   10/15/16 0627 10/15/16 1438 10/15/16 2150 10/16/16 0625  BP: (!) 121/47 (!) 135/39 (!) 128/48 (!) 134/47  Pulse: 74 70 72 71  Resp: 18 18 16 16   Temp: 98.1 F (36.7 C) 98.4 F (36.9 C) 98.2 F (36.8 C) 98.4 F (36.9 C)  TempSrc: Oral Oral Oral   SpO2: 100% 100% 100% 100%  Weight:      Height:        Intake/Output Summary (Last 24 hours) at 10/16/16 1000 Last data filed at 10/16/16 6440  Gross per 24 hour  Intake              450 ml  Output              600 ml  Net             -150 ml   Filed Weights   10/05/16 2049  Weight: 48.7 kg (107 lb 4.8 oz)    Examination: Gen: thinly built female female,alert and awake, oriented to self and only partly to place HEENT: PERRLA, Neck supple, no JVD Lungs: clear CVS: RRR,No Gallops,Rubs or new Murmurs Abd: soft, Non tender, non distended, BS present Extremities: No Cyanosis, Clubbing or edema Skin: no new rashes    Data Reviewed: I have personally reviewed following labs and imaging studies  CBC: No results for input(s): WBC, NEUTROABS, HGB, HCT, MCV, PLT in the last 168 hours. Basic Metabolic Panel: No results for input(s): NA, K, CL, CO2, GLUCOSE, BUN, CREATININE, CALCIUM, MG, PHOS in the last 168 hours. GFR: Estimated Creatinine Clearance: 24.4 mL/min (A) (by C-G formula based on SCr of 1.25 mg/dL (H)). Liver Function Tests: No results for input(s): AST, ALT, ALKPHOS,  BILITOT, PROT, ALBUMIN in the last 168 hours. No results for input(s): LIPASE, AMYLASE in the last 168 hours. No results for input(s): AMMONIA in the last 168 hours. Coagulation Profile: No results for input(s): INR, PROTIME in the last 168 hours. Cardiac Enzymes: No results for input(s): CKTOTAL, CKMB, CKMBINDEX, TROPONINI in the last 168 hours. BNP (last 3 results) No results for input(s): PROBNP in the last 8760 hours. HbA1C: No results for input(s): HGBA1C in the last 72 hours. CBG:  Recent Labs Lab 10/15/16 0739 10/15/16 1236 10/15/16 1706 10/15/16 2150 10/16/16 0813  GLUCAP 220* 271* 326* 254* 216*   Lipid Profile: No results for input(s): CHOL, HDL, LDLCALC, TRIG, CHOLHDL, LDLDIRECT in the last 72 hours. Thyroid Function Tests: No results for input(s): TSH, T4TOTAL, FREET4, T3FREE, THYROIDAB in the last 72 hours. Anemia Panel: No results for input(s): VITAMINB12, FOLATE, FERRITIN, TIBC, IRON, RETICCTPCT in the last 72 hours. Sepsis Labs: No results for input(s): PROCALCITON, LATICACIDVEN in the last 168 hours.  No results found for this or any previous visit (from the past 240 hour(s)).       Radiology Studies: No results found.      Scheduled Meds: . amiodarone  200 mg Oral Daily  .  amLODipine  10 mg Oral Daily  . apixaban  2.5 mg Oral BID  . atorvastatin  20 mg Oral q1800  . docusate sodium  100 mg Oral BID  . feeding supplement (ENSURE ENLIVE)  237 mL Oral TID BM  . gabapentin  200 mg Oral QHS  . nystatin  5 mL Oral QID  . pantoprazole  40 mg Oral BID  . polyethylene glycol  17 g Oral Daily  . potassium chloride SA  40 mEq Oral BID  . sucralfate  1 g Oral TID WC & HS  . tamsulosin  0.4 mg Oral QPC breakfast   Continuous Infusions:    LOS: 11 days        Domenic Polite, MD [facility] Page via [email or web address] password [email or web address]  After 7pm,  please contact night-coverage 10/16/2016, 10:00 AM

## 2016-10-16 NOTE — Progress Notes (Signed)
CSW has left voicemail for the number listed for patient's DSS worker, Otilio Connors, to follow up on status of DSS emergency custody and provide information on continued inability to contact patient's family.   CSW will follow up with information when available.  Laveda Abbe, [place] Clinical Social Worker [phone]

## 2016-10-16 NOTE — Progress Notes (Signed)
CSW spoke with APS worker, Otila Kluver, who indicated that she could proceed forward with seeking guardianship if CSW faxes over documentation to support the decision. CSW faxed over medical notes that documented patient's inability to make her own decisions at this time, the medical team's multiple attempts to contact family with no success, and the overall poor prognosis for the patient.   CSW will continue to follow to provide updates when available.  Laveda Abbe,  Clinical Social Worker [phone]

## 2016-10-16 NOTE — Progress Notes (Signed)
Inpatient Diabetes Program Recommendations  AACE/ADA: New Consensus Statement on Inpatient Glycemic Control (2015)  Target Ranges:  Prepandial:   less than 140 mg/dL      Peak postprandial:   less than 180 mg/dL (1-2 hours)      Critically ill patients:  140 - 180 mg/dL   Results for DEANNIE, RESETAR (MRN [id number]) as of 10/16/2016 11:26  Ref. Range 10/15/2016 07:39 10/15/2016 12:36 10/15/2016 17:06 10/15/2016 21:50 10/16/2016 08:13  Glucose-Capillary Latest Ref Range: 65 - 99 mg/dL 220 (H) 271 (H) 326 (H) 254 (H) 216 (H)    Review of Glycemic Control  Diabetes history: DM  Outpatient Diabetes medications: None Current orders for Inpatient glycemic control: None  Inpatient Diabetes Program Recommendations:    Patient had hypoglycemia on regular Novolog Correction scale however glucose remains elevated in the 200-300's. Consider a custom Novolog Sensitive Correction to start at 150 mg/dl.  -Custom Novolog correction scale 0-5 units       151-200  1 unit      201-250  2 units      251-300  3 units      301-350  4 units      351-400  5 units  Thanks,  Tama Headings RN, MSN, [facility] Inpatient Diabetes Coordinator Team Pager [phone] (8a-5p)

## 2016-10-16 NOTE — Plan of Care (Signed)
Problem: Pain Managment: Goal: General experience of comfort will improve Outcome: Progressing States no pain. Appears comfortable/ no distresses.  Problem: Nutrition: Goal: Adequate nutrition will be maintained Outcome: Not Progressing Refused meal and other available food options.

## 2016-10-16 NOTE — Progress Notes (Signed)
Notified MD Broadus John patient refused all morning meds, did not eat anything, that the patient's blood sugar was slightly elevated this morning and that I noticed her right arm was significantly swollen, she said she would check on her. Will continue to monitor.

## 2016-10-16 NOTE — Care Management Important Message (Signed)
Important Message  Patient Details  Name: Sherry [name] MRN: [id number] Date of Birth: December 25, 1927   Medicare Important Message Given:  Yes    [name] 10/16/2016, 9:15 AM

## 2016-10-17 DIAGNOSIS — N183 Chronic kidney disease, stage 3 (moderate): Secondary | ICD-10-CM | POA: Diagnosis not present

## 2016-10-17 DIAGNOSIS — I10 Essential (primary) hypertension: Secondary | ICD-10-CM | POA: Diagnosis not present

## 2016-10-17 DIAGNOSIS — J181 Lobar pneumonia, unspecified organism: Secondary | ICD-10-CM | POA: Diagnosis not present

## 2016-10-17 LAB — GLUCOSE, CAPILLARY
GLUCOSE-CAPILLARY: 224 mg/dL — AB (ref 65–99)
GLUCOSE-CAPILLARY: 263 mg/dL — AB (ref 65–99)
Glucose-Capillary: 234 mg/dL — ABNORMAL HIGH (ref 65–99)
Glucose-Capillary: 209 mg/dL — ABNORMAL HIGH (ref 65–99)

## 2016-10-17 NOTE — Progress Notes (Signed)
Patient ID: Sherry [name], female   DOB: 01-Oct-1927, 83 y.o.   MRN: [id number]  PROGRESS NOTE    Sherry [name]  GLO:[id number] DOB: 1927-03-24 DOA: 10/05/2016 PCP: [name], MD   Brief Narrative:  83 y.o. female with medical history significant of hypertension, diabetes, chronic kidney disease, pulmonary embolism on oral anticoagulation, hyperlipidemia, suspected dementia and recent admission and discharge on 09/19/2016 at [facility] for acute encephalopathy due to severe hypoglycemia and hematemesis with possible upper GI bleed was sent from nursing home for altered mental status.   Her overall condition is not improving. Palliative care consultation, Dr.Golding recommended Comfort care, Family unable to be reached for > 10days Pt refuses to eat or drink anything, refuses her meds CSW working with APS on guardianship   Assessment & Plan:  Metabolic encephalopathy -possibly due to infections as noted below in the background of suspected senile dementia. - Patient was making her decisions at SNF prior to admission; patient suspected to have some degree of dementia, answers some simple questions, nods yes or no and clearly refuses and says " No" when offered any meds or food at this time -mentation improved since admission but at this time, oriented to self and only partly to place and not to time - under these circumstances she doesn't have the capacity to make any decisions for herself and since family is unreachable despite multiple attempts for a week, CSW following and DSS has been asked to look into guardianship. Per CSW, APS has an open case regarding Elder Neglect against some family members -plan for DC to SNF with comfort focused care when guardianship obtained  Probable healthcare associated pneumonia/atelectasis vs pneumonia noted on admission CXR - blood cultures negative  - Repeat chest x-ray shows no evidence of infiltrates - was on IV Abx and then changed to PO  Augmentin-she continues to refuse all meds, hence stopped augmentin - Urine streptococcal antigen was positive.   - SLP evaluation completed, on dysphagia 1 diet, medications crushed with puree.  Probable UTI - Urine culture grew enterococcus and Proteus. -completed Rx  Right renal mass - CT abdomen still shows right renal mass.  - urology consult per Dr.Bell appreciated, likely transitional cell CA, per Urology too frail for any surgical intervention - No further workup suggested because of overall poor prognosis.  Generalized deconditioning/Failure to thrive/ severe malnutrition and refusal to take meds and Po intake - We have tried to reach family members on numerous occasions over 1 week, unable to reach any family members to make any decisions regarding Sherry [name] - Palliative care consult for goals of care. Overall prognosis is very poor and Po intake is none to minimal at this time -in her current situation with refusal to eat anything, take any medications, advanced age, severe malnutrition and ongoing failure to thrive doing anything aggressive would be futile -My recommendations are for comfort focused care -appreciate Palliative input per Dr.Golding, she recommended Comfort care on 10/3 -CSW following for guardianship  Nausea and vomiting - on admission, resolved - CT abdomen did not show any obstruction.  Chronic kidney disease stage III - creatinine stable, no value in repeat blood draws  History of pulmonary embolism - on chronic anticoagulation with Eliquis, however mostly refuses most meds at this time  Probable chronic atrial fibrillation - Continue anticoagulation. Continue amiodarone. Intermittently  bradycardic  Hypertension - stable for the most part  Diabetes mellitus type 2 with hyperglycemia and hypo - Accu-Cheks with insulin sliding scale  coverage - stopped lantus due to intermittent hypoglycemia in setting of poor Po Intake  Hypokalemia and  hypomagnesemia - stopped daily labs   DVT prophylaxis: Eliquis Code Status:  DO NOT RESUSCITATE Family Communication: None at bedside Disposition Plan: unable to plan dispo without contact with family or court appointed guardian, once guardianship has been obtained will discharge to skilled nursing facility with recommendations for comfort focused care  Consultants: Palliative care; urology  Procedures: None  Antimicrobials: Cefepime on 10/05/2016 and 10/06/2016 Vancomycin from 10/05/2016-10/06/2016 augmentin was ordered on 10/08/2016 but patient is not taking it  Subjective: -no changes, refused meds and food, drank a few sips of coffee  Objective: Vitals:   10/16/16 0625 10/16/16 2003 10/17/16 0426 10/17/16 1405  BP: (!) 134/47 (!) 144/48 (!) 130/44 (!) 123/40  Pulse: 71 64 69 70  Resp: 16 16 15 15   Temp: 98.4 F (36.9 C) 98 F (36.7 C) 98.7 F (37.1 C) 98.4 F (36.9 C)  TempSrc:  Oral Oral Oral  SpO2: 100% 99% 100% 100%  Weight:      Height:        Intake/Output Summary (Last 24 hours) at 10/17/16 1423 Last data filed at 10/17/16 1157  Gross per 24 hour  Intake              290 ml  Output              300 ml  Net              -10 ml   Filed Weights   10/05/16 2049  Weight: 48.7 kg (107 lb 4.8 oz)    Examination:  Gen: Frail, thinly built female, awake, oriented to self and partly to place HEENT: PERRLA, Neck supple, no JVD Lungs: clear CVS: RRR,No Gallops,Rubs or new Murmurs Abd: soft, Non tender, non distended, BS present Extremities: No Cyanosis, Clubbing or edema Skin: no new rashes   Data Reviewed: I have personally reviewed following labs and imaging studies  CBC: No results for input(s): WBC, NEUTROABS, HGB, HCT, MCV, PLT in the last 168 hours. Basic Metabolic Panel: No results for input(s): NA, K, CL, CO2, GLUCOSE, BUN, CREATININE, CALCIUM, MG, PHOS in the last 168 hours. GFR: Estimated Creatinine Clearance: 24.4 mL/min (A) (by C-G formula  based on SCr of 1.25 mg/dL (H)). Liver Function Tests: No results for input(s): AST, ALT, ALKPHOS, BILITOT, PROT, ALBUMIN in the last 168 hours. No results for input(s): LIPASE, AMYLASE in the last 168 hours. No results for input(s): AMMONIA in the last 168 hours. Coagulation Profile: No results for input(s): INR, PROTIME in the last 168 hours. Cardiac Enzymes: No results for input(s): CKTOTAL, CKMB, CKMBINDEX, TROPONINI in the last 168 hours. BNP (last 3 results) No results for input(s): PROBNP in the last 8760 hours. HbA1C: No results for input(s): HGBA1C in the last 72 hours. CBG:  Recent Labs Lab 10/16/16 1205 10/16/16 1731 10/16/16 2121 10/17/16 0728 10/17/16 1153  GLUCAP 223* 221* 208* 224* 263*   Lipid Profile: No results for input(s): CHOL, HDL, LDLCALC, TRIG, CHOLHDL, LDLDIRECT in the last 72 hours. Thyroid Function Tests: No results for input(s): TSH, T4TOTAL, FREET4, T3FREE, THYROIDAB in the last 72 hours. Anemia Panel: No results for input(s): VITAMINB12, FOLATE, FERRITIN, TIBC, IRON, RETICCTPCT in the last 72 hours. Sepsis Labs: No results for input(s): PROCALCITON, LATICACIDVEN in the last 168 hours.  No results found for this or any previous visit (from the past 240 hour(s)).  Radiology Studies: No results found.      Scheduled Meds: . amiodarone  200 mg Oral Daily  . amLODipine  10 mg Oral Daily  . apixaban  2.5 mg Oral BID  . atorvastatin  20 mg Oral q1800  . docusate sodium  100 mg Oral BID  . feeding supplement (ENSURE ENLIVE)  237 mL Oral TID BM  . gabapentin  200 mg Oral QHS  . nystatin  5 mL Oral QID  . pantoprazole  40 mg Oral BID  . polyethylene glycol  17 g Oral Daily  . potassium chloride SA  40 mEq Oral BID  . sucralfate  1 g Oral TID WC & HS  . tamsulosin  0.4 mg Oral QPC breakfast   Continuous Infusions:    LOS: 12 days        Domenic Polite, MD [facility] Page via [email or web address] password [email or web address]  After 7pm,   please contact night-coverage 10/17/2016, 2:23 PM

## 2016-10-17 NOTE — Progress Notes (Signed)
Inpatient Diabetes Program Recommendations  AACE/ADA: New Consensus Statement on Inpatient Glycemic Control (2015)  Target Ranges:  Prepandial:   less than 140 mg/dL      Peak postprandial:   less than 180 mg/dL (1-2 hours)      Critically ill patients:  140 - 180 mg/dL   Lab Results  Component Value Date   GLUCAP 224 (H) 10/17/2016   HGBA1C 5.7 (H) 10/06/2016    Review of Glycemic Control Results for Sherry [name], Sherry [name] (MRN [id number]) as of 10/17/2016 11:47  Ref. Range 10/16/2016 08:13 10/16/2016 12:05 10/16/2016 17:31 10/16/2016 21:21 10/17/2016 07:28  Glucose-Capillary Latest Ref Range: 65 - 99 mg/dL 216 (H) 223 (H) 221 (H) 208 (H) 224 (H)   Diabetes history: DM 2 Outpatient Diabetes medications: None Current orders for Inpatient glycemic control: None  Inpatient Diabetes Program Recommendations:   Glucose remains elevated in the 200-300's. Consider a custom Novolog Sensitive Correction to start at 150 mg/dl.  -Custom Novolog correction scale 0-5 units       151-200  1 unit      201-250  2 units      251-300  3 units      301-350  4 units      351-400  5 units  Thank you, Bethena Roys E. Justene Jensen, RN, MSN, CDE  Diabetes Coordinator Inpatient Glycemic Control Team Team Pager [phone] (8am-5pm) 10/17/2016 11:48 AM

## 2016-10-17 NOTE — Progress Notes (Signed)
CSW spoke with representative from [facility] to discuss patient returning. Admissions coordinator is unsure of the facility decision regarding accepting the patient back without an active guardian in place; will contact administration and call CSW back.  CSW to continue to follow, and will update medical team with information when available.  Laveda Abbe, [place] Clinical Social Worker [phone]

## 2016-10-18 LAB — BASIC METABOLIC PANEL
ANION GAP: 12 (ref 5–15)
BUN: 16 mg/dL (ref 6–20)
CHLORIDE: 112 mmol/L — AB (ref 101–111)
CO2: 25 mmol/L (ref 22–32)
Calcium: 8 mg/dL — ABNORMAL LOW (ref 8.9–10.3)
Creatinine, Ser: 1.17 mg/dL — ABNORMAL HIGH (ref 0.44–1.00)
GFR calc non Af Amer: 41 mL/min — ABNORMAL LOW (ref >60)
GFR, EST AFRICAN AMERICAN: 47 mL/min — AB (ref >60)
GLUCOSE: 243 mg/dL — AB (ref 65–99)
Potassium: 4.1 mmol/L (ref 3.5–5.1)
Sodium: 149 mmol/L — ABNORMAL HIGH (ref 135–145)

## 2016-10-18 LAB — GLUCOSE, CAPILLARY
GLUCOSE-CAPILLARY: 179 mg/dL — AB (ref 65–99)
Glucose-Capillary: 213 mg/dL — ABNORMAL HIGH (ref 65–99)
Glucose-Capillary: 251 mg/dL — ABNORMAL HIGH (ref 65–99)
Glucose-Capillary: 207 mg/dL — ABNORMAL HIGH (ref 65–99)

## 2016-10-18 NOTE — Progress Notes (Addendum)
PROGRESS NOTE    Sherry [name]  UMP:[id number] DOB: [birth year] DOA: 10/05/2016 PCP: [name], MD    Brief Narrative:  As per history for hypertension, type 2 diabetes mellitus, chronic kidney disease, pulmonary embolism, dyslipidemia and suspected dementia. Patient had a recent hospitalization for encephalopathy due to severe hypoglycemia. She was transferred from the nursing home this time back to the hospital due to altered mentation. She was not able to give any detailed history on admission. On initial physical examination blood pressure 173/74, heart rate 89, respiratory rate 17, oxygen saturation 100%. Dry mucous membranes, decreased breath sounds bilaterally with positive rales, heart S1-S2 present rhythmic, the abdomen had tenderness in the mid epigastrium, no guarding or rebound, no lower extremity edema. Sodium 140, potassium 3.3, chloride 105, bicarbonate 12, glucose 294, BUN 11, creatinine 1.38, white count 3.6, hemoglobin 10.9, hematocrit 34.3, platelets 394, , urinalysis with 6-30 white cells, 100 protein, specific gravity 1.020, urine streptococcal pneumonia antigen positive. Head CT with diffuse cortical atrophy. Chest x-ray with right basal opacity. EKG sinus rhythm, left axis deviation, left bundle-branch block, T-wave inversions in lateral leads, poor R-wave progression. Left ventricular hypertrophy.  Patient admitted to the hospital working diagnosis of healthcare associated pneumonia complicated by metabolic encephalopathy.  Assessment & Plan:   Principal Problem:   Pneumonia Active Problems:   Chronic kidney disease (CKD), stage III (moderate) (HCC)   Essential hypertension   Nausea & vomiting  1. Healthcare associated pneumonia. Patient has completed antibiotic therapy, clinically with no dyspnea, has remained afebrile, no leukocytosis.   2. Metabolic encephalopathy. Patient with no agitation or confusion, orientated x2, person and place.   3. Urinary tract  infection. Patient completed antibiotic therapy.   4. Right renal mass. Will need outpatient follow up.   5. Generalized deconditioning, severe malnutrition, calorie-protein. Continue nutritional supplements.   6. Chronic kidney disease stage III. Renal function stable, will check renal function today, will hold on K supplements. Patient tolerating po well.   7. Atrial fibrillation/ paroxysmal. Rate controlled, will continue amiodarone and anticoagulation with apixaban. Continue metoprolol.   8. Type 2 diabetes mellitus. Insulin sliding scale for glucose cover and monitoring, capillary glucose 263, 234, 209, 213, 251. If persistent elevation above 200, will add low dose basal insulin.     9. Hypertension. Blood pressure controlled, will continue amlodipine  10. History of pulmonary embolism. Will continue apixaban for anticoagulation.     DVT prophylaxis: apixaban  Code Status: dnr Family Communication:  Disposition Plan:    Consultants:     Procedures:     Antimicrobials:       Subjective: Patient feeling better, no nausea or vomiting, no significant dyspnea or chest pain. Positive fatigue and weakness.   Objective: Vitals:   10/17/16 0426 10/17/16 1405 10/17/16 2225 10/18/16 0411  BP: (!) 130/44 (!) 123/40 (!) 126/44 (!) 122/43  Pulse: 69 70 68 70  Resp: 15 15 16    Temp: 98.7 F (37.1 C) 98.4 F (36.9 C) 98 F (36.7 C) 97.9 F (36.6 C)  TempSrc: Oral Oral Oral   SpO2: 100% 100% 99% 98%  Weight:      Height:        Intake/Output Summary (Last 24 hours) at 10/18/16 1211 Last data filed at 10/18/16 0716  Gross per 24 hour  Intake              120 ml  Output              300  ml  Net             -180 ml   Filed Weights   10/05/16 2049  Weight: 48.7 kg (107 lb 4.8 oz)    Examination:  General: deconditioned.  Neurology: Awake and alert, non focal. Disorientated in terms of time but not place.   E ENT: mild pallor, no icterus, oral mucosa  moist Cardiovascular: No JVD. S1-S2 present, rhythmic, no gallops, rubs, or murmurs. No lower extremity edema. Pulmonary: decreased breath sounds bilaterally at bases, adequate air movement, no wheezing, rhonchi. But positive rales. Gastrointestinal. Abdomen flat, no organomegaly, non tender, no rebound or guarding Skin. No rashes Musculoskeletal: no joint deformities     Data Reviewed: I have personally reviewed following labs and imaging studies  CBC: No results for input(s): WBC, NEUTROABS, HGB, HCT, MCV, PLT in the last 168 hours. Basic Metabolic Panel: No results for input(s): NA, K, CL, CO2, GLUCOSE, BUN, CREATININE, CALCIUM, MG, PHOS in the last 168 hours. GFR: Estimated Creatinine Clearance: 24.4 mL/min (A) (by C-G formula based on SCr of 1.25 mg/dL (H)). Liver Function Tests: No results for input(s): AST, ALT, ALKPHOS, BILITOT, PROT, ALBUMIN in the last 168 hours. No results for input(s): LIPASE, AMYLASE in the last 168 hours. No results for input(s): AMMONIA in the last 168 hours. Coagulation Profile: No results for input(s): INR, PROTIME in the last 168 hours. Cardiac Enzymes: No results for input(s): CKTOTAL, CKMB, CKMBINDEX, TROPONINI in the last 168 hours. BNP (last 3 results) No results for input(s): PROBNP in the last 8760 hours. HbA1C: No results for input(s): HGBA1C in the last 72 hours. CBG:  Recent Labs Lab 10/17/16 0728 10/17/16 1153 10/17/16 1639 10/17/16 2224 10/18/16 0849  GLUCAP 224* 263* 234* 209* 213*   Lipid Profile: No results for input(s): CHOL, HDL, LDLCALC, TRIG, CHOLHDL, LDLDIRECT in the last 72 hours. Thyroid Function Tests: No results for input(s): TSH, T4TOTAL, FREET4, T3FREE, THYROIDAB in the last 72 hours. Anemia Panel: No results for input(s): VITAMINB12, FOLATE, FERRITIN, TIBC, IRON, RETICCTPCT in the last 72 hours.    Radiology Studies: I have reviewed all of the imaging during this hospital visit  personally     Scheduled Meds: . amiodarone  200 mg Oral Daily  . amLODipine  10 mg Oral Daily  . apixaban  2.5 mg Oral BID  . atorvastatin  20 mg Oral q1800  . docusate sodium  100 mg Oral BID  . feeding supplement (ENSURE ENLIVE)  237 mL Oral TID BM  . gabapentin  200 mg Oral QHS  . nystatin  5 mL Oral QID  . pantoprazole  40 mg Oral BID  . polyethylene glycol  17 g Oral Daily  . potassium chloride SA  40 mEq Oral BID  . sucralfate  1 g Oral TID WC & HS  . tamsulosin  0.4 mg Oral QPC breakfast   Continuous Infusions:   LOS: 13 days        Maxamilian Amadon Gerome Apley, MD [facility] Pager [phone]

## 2016-10-19 LAB — BASIC METABOLIC PANEL
Anion gap: 11 (ref 5–15)
BUN: 19 mg/dL (ref 6–20)
CALCIUM: 8 mg/dL — AB (ref 8.9–10.3)
CO2: 23 mmol/L (ref 22–32)
CREATININE: 1.2 mg/dL — AB (ref 0.44–1.00)
Chloride: 115 mmol/L — ABNORMAL HIGH (ref 101–111)
GFR calc non Af Amer: 39 mL/min — ABNORMAL LOW (ref >60)
GFR, EST AFRICAN AMERICAN: 46 mL/min — AB (ref >60)
GLUCOSE: 262 mg/dL — AB (ref 65–99)
Potassium: 4.1 mmol/L (ref 3.5–5.1)
Sodium: 149 mmol/L — ABNORMAL HIGH (ref 135–145)

## 2016-10-19 LAB — GLUCOSE, CAPILLARY
GLUCOSE-CAPILLARY: 165 mg/dL — AB (ref 65–99)
Glucose-Capillary: 227 mg/dL — ABNORMAL HIGH (ref 65–99)
Glucose-Capillary: 251 mg/dL — ABNORMAL HIGH (ref 65–99)

## 2016-10-19 MED ORDER — INSULIN ASPART 100 UNIT/ML ~~LOC~~ SOLN
0.0000 [IU] | Freq: Three times a day (TID) | SUBCUTANEOUS | Status: DC
  Administered 2016-10-19: 5 [IU] via SUBCUTANEOUS
  Administered 2016-10-20 (×2): 2 [IU] via SUBCUTANEOUS
  Administered 2016-10-20: 1 [IU] via SUBCUTANEOUS
  Administered 2016-10-21: 2 [IU] via SUBCUTANEOUS
  Administered 2016-10-21: 1 [IU] via SUBCUTANEOUS
  Administered 2016-10-21: 3 [IU] via SUBCUTANEOUS
  Administered 2016-10-23: 5 [IU] via SUBCUTANEOUS
  Administered 2016-10-24: 1 [IU] via SUBCUTANEOUS
  Administered 2016-10-25: 5 [IU] via SUBCUTANEOUS
  Administered 2016-10-26: 2 [IU] via SUBCUTANEOUS
  Administered 2016-10-26: 1 [IU] via SUBCUTANEOUS
  Administered 2016-10-27: 3 [IU] via SUBCUTANEOUS
  Administered 2016-10-27 (×2): 2 [IU] via SUBCUTANEOUS
  Administered 2016-10-28: 1 [IU] via SUBCUTANEOUS
  Administered 2016-10-28: 2 [IU] via SUBCUTANEOUS
  Administered 2016-10-28: 1 [IU] via SUBCUTANEOUS
  Administered 2016-10-29: 2 [IU] via SUBCUTANEOUS
  Administered 2016-10-30 – 2016-11-03 (×6): 1 [IU] via SUBCUTANEOUS

## 2016-10-19 NOTE — Care Management Important Message (Signed)
Important Message  Patient Details  Name: Sherry [name] MRN: [id number] Date of Birth: [birth year]   Medicare Important Message Given:  Yes    Zoee Heeney Abena 10/19/2016, 9:53 AM

## 2016-10-19 NOTE — Progress Notes (Addendum)
CSW following to facilitate discharge to SNF. [facility] will not accept the patient back without guardianship established; there must be APS guardianship established before the patient can admit back to [facility].  CSW has faxed out referrals, and will follow up individually on bed offers received. Patient received bed offer from [facility]. CSW has reached out to admissions coordinator to discuss situation and ensure that the bed offer still stands given all of the social issues. [place] admissions to touch base with administration and will call CSW back.  CSW will update with information when received.  Laveda Abbe, LCSW Clinical Social Worker [phone]    UPDATE 4:30 PM:  CSW received denials of bed offers from [facility] and [facility]. Facilities will be willing to admit patient once a legal guardian has been established through [place], but will not be willing to admit prior to guardianship being established. CSW still awaiting a decision from [employer].  CSW will continue to individually contact facilities to try to obtain a bed offer for the patient while guardianship is still being established through [place].   Laveda Abbe, [place] Clinical Social Worker [phone]

## 2016-10-19 NOTE — Progress Notes (Signed)
PROGRESS NOTE    Sherry [name]  NGE:[id number] DOB: [birth year] DOA: 10/05/2016 PCP: [name], MD    Brief Narrative:  83 yo female with history of hypertension, type 2 diabetes mellitus, chronic kidney disease, pulmonary embolism, dyslipidemia and suspected dementia. Patient had a recent hospitalization for encephalopathy due to severe hypoglycemia. She was transferred from the nursing home this time back to the hospital due to altered mentation. She was not able to give any detailed history on admission. On initial physical examination blood pressure 173/74, heart rate 89, respiratory rate 17, oxygen saturation 100%. Dry mucous membranes, decreased breath sounds bilaterally with positive rales, heart S1-S2 present rhythmic, the abdomen had tenderness in the mid epigastrium, no guarding or rebound, no lower extremity edema. Sodium 140, potassium 3.3, chloride 105, bicarbonate 12, glucose 294, BUN 11, creatinine 1.38, white count 3.6, hemoglobin 10.9, hematocrit 34.3, platelets 394, , urinalysis with 6-30 white cells, 100 protein, specific gravity 1.020, urine streptococcal pneumonia antigen positive. Head CT with diffuse cortical atrophy. Chest x-ray with right basal opacity. EKG sinus rhythm, left axis deviation, left bundle-branch block, T-wave inversions in lateral leads, poor R-wave progression. Left ventricular hypertrophy.  Patient admitted to the hospital working diagnosis of healthcare associated pneumonia complicated by metabolic encephalopathy.   Assessment & Plan:   Principal Problem:   Pneumonia Active Problems:   Chronic kidney disease (CKD), stage III (moderate) (HCC)   Essential hypertension   Nausea & vomiting  1. Healthcare associated pneumonia. Clinically with no dyspnea. Completed antibiotic therapy.    2. Metabolic encephalopathy. No agitation or confusion.   3. Urinary tract infection. Treated and resolved.    4. Right renal mass. Outpatient follow up.    5. Generalized deconditioning, severe malnutrition, calorie-protein. Nutritional supplements.   6. Chronic kidney disease stage III. Renal function with stable serum cr at 1,20, with K at 4,1 and serum bicarbonate at 23. Will continue to hold on k supplements for now.   7. Atrial fibrillation/ paroxysmal. Continue with amiodarone and metoprolol, anticoagulation with apixaban.   8. Type 2 diabetes mellitus. Insulin sliding scale for glucose cover and monitoring. Capillary glucose 207, 179, 227, 251,   9. Hypertension. Continue Amlodipine for blood pressure control.   10. History of pulmonary embolism. On apixaban for anticoagulation, with good toleration  Patient waiting for placement.     DVT prophylaxis: apixaban  Code Status: dnr Family Communication:  Disposition Plan:    Consultants:     Procedures:     Antimicrobials:       Subjective: Patient with no active complains, no dyspnea, no chest pain, no palpitations, no nausea or vomiting, tolerating po well. Waiting for placement.   Objective: Vitals:   10/18/16 1221 10/18/16 2134 10/19/16 0639 10/19/16 1359  BP: (!) 140/48 (!) 142/56 (!) 140/58 (!) 118/34  Pulse: 100 78 75 73  Resp: 16 16 16 16   Temp: 98.6 F (37 C) 97.9 F (36.6 C) 98 F (36.7 C) 98.8 F (37.1 C)  TempSrc: Oral Oral Oral Oral  SpO2: 100% 100% 100% 98%  Weight:      Height:        Intake/Output Summary (Last 24 hours) at 10/19/16 1624 Last data filed at 10/19/16 1100  Gross per 24 hour  Intake              120 ml  Output              275 ml  Net             -  155 ml   Filed Weights   10/05/16 2049  Weight: 48.7 kg (107 lb 4.8 oz)    Examination:  General: Not in pain or dyspnea, deconditioned Neurology: Awake and alert, non focal  E ENT: mild pallor, no icterus, oral mucosa moist Cardiovascular: No JVD. S1-S2 present, rhythmic, no gallops, rubs, or murmurs. No lower extremity edema. Pulmonary: vesicular  breath sounds bilaterally, adequate air movement, no wheezing, rhonchi or rales. Gastrointestinal. Abdomen flat, no organomegaly, non tender, no rebound or guarding Skin. No rashes Musculoskeletal: no joint deformities     Data Reviewed: I have personally reviewed following labs and imaging studies  CBC: No results for input(s): WBC, NEUTROABS, HGB, HCT, MCV, PLT in the last 168 hours. Basic Metabolic Panel:  Recent Labs Lab 10/18/16 1304 10/19/16 0926  NA 149* 149*  K 4.1 4.1  CL 112* 115*  CO2 25 23  GLUCOSE 243* 262*  BUN 16 19  CREATININE 1.17* 1.20*  CALCIUM 8.0* 8.0*   GFR: Estimated Creatinine Clearance: 25.4 mL/min (A) (by C-G formula based on SCr of 1.2 mg/dL (H)). Liver Function Tests: No results for input(s): AST, ALT, ALKPHOS, BILITOT, PROT, ALBUMIN in the last 168 hours. No results for input(s): LIPASE, AMYLASE in the last 168 hours. No results for input(s): AMMONIA in the last 168 hours. Coagulation Profile: No results for input(s): INR, PROTIME in the last 168 hours. Cardiac Enzymes: No results for input(s): CKTOTAL, CKMB, CKMBINDEX, TROPONINI in the last 168 hours. BNP (last 3 results) No results for input(s): PROBNP in the last 8760 hours. HbA1C: No results for input(s): HGBA1C in the last 72 hours. CBG:  Recent Labs Lab 10/17/16 2224 10/18/16 0849 10/18/16 1218 10/18/16 1608 10/18/16 2133  GLUCAP 209* 213* 251* 207* 179*   Lipid Profile: No results for input(s): CHOL, HDL, LDLCALC, TRIG, CHOLHDL, LDLDIRECT in the last 72 hours. Thyroid Function Tests: No results for input(s): TSH, T4TOTAL, FREET4, T3FREE, THYROIDAB in the last 72 hours. Anemia Panel: No results for input(s): VITAMINB12, FOLATE, FERRITIN, TIBC, IRON, RETICCTPCT in the last 72 hours.    Radiology Studies: I have reviewed all of the imaging during this hospital visit personally     Scheduled Meds: . amiodarone  200 mg Oral Daily  . amLODipine  10 mg Oral Daily  .  apixaban  2.5 mg Oral BID  . atorvastatin  20 mg Oral q1800  . docusate sodium  100 mg Oral BID  . feeding supplement (ENSURE ENLIVE)  237 mL Oral TID BM  . gabapentin  200 mg Oral QHS  . nystatin  5 mL Oral QID  . pantoprazole  40 mg Oral BID  . polyethylene glycol  17 g Oral Daily  . sucralfate  1 g Oral TID WC & HS  . tamsulosin  0.4 mg Oral QPC breakfast   Continuous Infusions:   LOS: 14 days        Mauricio Gerome Apley, MD [facility] Pager [phone]

## 2016-10-19 NOTE — Progress Notes (Signed)
Patient has not eaten solid food today. She only takes sips of liquids. Refused all po meds except Zofran. C/o nausea intermittently.

## 2016-10-20 DIAGNOSIS — I482 Chronic atrial fibrillation: Secondary | ICD-10-CM

## 2016-10-20 DIAGNOSIS — I2609 Other pulmonary embolism with acute cor pulmonale: Secondary | ICD-10-CM

## 2016-10-20 LAB — GLUCOSE, CAPILLARY
GLUCOSE-CAPILLARY: 131 mg/dL — AB (ref 65–99)
Glucose-Capillary: 199 mg/dL — ABNORMAL HIGH (ref 65–99)
Glucose-Capillary: 179 mg/dL — ABNORMAL HIGH (ref 65–99)
Glucose-Capillary: 165 mg/dL — ABNORMAL HIGH (ref 65–99)

## 2016-10-20 NOTE — Progress Notes (Addendum)
Refused breakfast, fluids offered, drunk lemonade and sprite.Marland Kitchen

## 2016-10-20 NOTE — Progress Notes (Signed)
PROGRESS NOTE    LATIESHA HARADA  OXB:[id number] DOB: [birth year] DOA: 10/05/2016 PCP: [name], MD    Brief Narrative:  83 yo female with history of hypertension, type 2 diabetes mellitus, chronic kidney disease, pulmonary embolism, dyslipidemia and suspected dementia. Patient had a recent hospitalization for encephalopathy due to severe hypoglycemia. She was transferred from the nursing home this time back to the hospital due to altered mentation. She was not able to give any detailed history on admission. On initial physical examination blood pressure 173/74, heart rate 89, respiratory rate 17, oxygen saturation 100%. Dry mucous membranes, decreased breath sounds bilaterally with positive rales, heart S1-S2 present rhythmic, the abdomen had tenderness in the mid epigastrium, no guarding or rebound, no lower extremity edema. Sodium 140, potassium 3.3, chloride 105, bicarbonate 12, glucose 294, BUN 11, creatinine 1.38, white count 3.6, hemoglobin 10.9, hematocrit 34.3, platelets 394, , urinalysis with 6-30 white cells, 100 protein, specific gravity 1.020, urine streptococcal pneumonia antigen positive. Head CT with diffuse cortical atrophy. Chest x-ray with right basal opacity. EKG sinus rhythm, left axis deviation, left bundle-branch block, T-wave inversions in lateral leads, poor R-wave progression. Left ventricular hypertrophy.  Patient admitted to the hospital working diagnosis of healthcare associated pneumonia complicated by metabolic encephalopathy.   Assessment & Plan:   Principal Problem:   Pneumonia Active Problems:   Chronic kidney disease (CKD), stage III (moderate) (HCC)   Essential hypertension   Nausea & vomiting   1. Healthcare associated pneumonia and urinary tract infection. Currently has resolved.   2. Metabolic encephalopathy. Patient not interactive but no reported agitation.    3. Generalized deconditioning, severe malnutrition, calorie-protein. Continue with  nutritional supplements. Will need placement, follow with social services.   6. Chronic kidney disease stage III with right renal mass. Renal function has been stable, will follow on renal panel in am, patient not on K supplements.  7. Atrial fibrillation/ paroxysmal. Tolerating amiodarone and metoprolol for rate control, continue with apixaban for anticoagulation.   8. Type 2 diabetes mellitus. Continue with insulin sliding scale for glucose cover and monitoring. Capillary glucose 165, 199, 179.  9. Hypertension. On Amlodipine with good blood pressure control.   10. History of pulmonary embolism.Continue with apixaban for anticoagulation.  Patient waiting for placement.    DVT prophylaxis:apixaban Code Status:dnr Family Communication: Disposition Plan:   Consultants:    Procedures:  Subjective: Patient not much interactive, no dyspnea or chest pain, no nausea or vomiting. Waiting for placement.   Objective: Vitals:   10/19/16 0639 10/19/16 1359 10/19/16 2120 10/20/16 0436  BP: (!) 140/58 (!) 118/34 (!) 120/40 (!) 137/59  Pulse: 75 73 73 91  Resp: 16 16 16    Temp: 98 F (36.7 C) 98.8 F (37.1 C) 98.7 F (37.1 C) 98.1 F (36.7 C)  TempSrc: Oral Oral Oral Oral  SpO2: 100% 98% 98% 100%  Weight:      Height:        Intake/Output Summary (Last 24 hours) at 10/20/16 1204 Last data filed at 10/20/16 0436  Gross per 24 hour  Intake               60 ml  Output              300 ml  Net             -240 ml   Filed Weights   10/05/16 2049  Weight: 48.7 kg (107 lb 4.8 oz)    Examination:  General: Not  in pain or dyspnea, deconditioned Neurology: Awake and alert, non focal  E ENT: mild pallor, no icterus, oral mucosa moist Cardiovascular: No JVD. S1-S2 present, rhythmic, no gallops, rubs, or murmurs. No lower extremity edema. Pulmonary: vesicular breath sounds bilaterally, adequate air movement, no wheezing, rhonchi or rales. Gastrointestinal.  Abdomen flat, no organomegaly, non tender, no rebound or guarding Skin. No rashes Musculoskeletal: no joint deformities     Data Reviewed: I have personally reviewed following labs and imaging studies  CBC: No results for input(s): WBC, NEUTROABS, HGB, HCT, MCV, PLT in the last 168 hours. Basic Metabolic Panel:  Recent Labs Lab 10/18/16 1304 10/19/16 0926  NA 149* 149*  K 4.1 4.1  CL 112* 115*  CO2 25 23  GLUCOSE 243* 262*  BUN 16 19  CREATININE 1.17* 1.20*  CALCIUM 8.0* 8.0*   GFR: Estimated Creatinine Clearance: 25.4 mL/min (A) (by C-G formula based on SCr of 1.2 mg/dL (H)). Liver Function Tests: No results for input(s): AST, ALT, ALKPHOS, BILITOT, PROT, ALBUMIN in the last 168 hours. No results for input(s): LIPASE, AMYLASE in the last 168 hours. No results for input(s): AMMONIA in the last 168 hours. Coagulation Profile: No results for input(s): INR, PROTIME in the last 168 hours. Cardiac Enzymes: No results for input(s): CKTOTAL, CKMB, CKMBINDEX, TROPONINI in the last 168 hours. BNP (last 3 results) No results for input(s): PROBNP in the last 8760 hours. HbA1C: No results for input(s): HGBA1C in the last 72 hours. CBG:  Recent Labs Lab 10/18/16 2133 10/19/16 1202 10/19/16 1703 10/19/16 2134 10/20/16 0823  GLUCAP 179* 227* 251* 165* 199*   Lipid Profile: No results for input(s): CHOL, HDL, LDLCALC, TRIG, CHOLHDL, LDLDIRECT in the last 72 hours. Thyroid Function Tests: No results for input(s): TSH, T4TOTAL, FREET4, T3FREE, THYROIDAB in the last 72 hours. Anemia Panel: No results for input(s): VITAMINB12, FOLATE, FERRITIN, TIBC, IRON, RETICCTPCT in the last 72 hours.    Radiology Studies: I have reviewed all of the imaging during this hospital visit personally     Scheduled Meds: . amiodarone  200 mg Oral Daily  . amLODipine  10 mg Oral Daily  . apixaban  2.5 mg Oral BID  . atorvastatin  20 mg Oral q1800  . docusate sodium  100 mg Oral BID  .  feeding supplement (ENSURE ENLIVE)  237 mL Oral TID BM  . gabapentin  200 mg Oral QHS  . insulin aspart  0-9 Units Subcutaneous TID WC  . nystatin  5 mL Oral QID  . pantoprazole  40 mg Oral BID  . polyethylene glycol  17 g Oral Daily  . sucralfate  1 g Oral TID WC & HS  . tamsulosin  0.4 mg Oral QPC breakfast   Continuous Infusions:   LOS: 15 days        Mauricio Gerome Apley, MD [facility] Pager [phone]

## 2016-10-20 NOTE — Progress Notes (Signed)
CSW continuing to follow to try to locate a bed offer for patient. CSW checked back in with [facility]; Admissions Coordinator will attempt to reach administration again today and update with decision. CSW contacted [facility] in [place] to review case, and received a denial for placement for the patient at that facility.  CSW will continue to follow and provide updates when available.  Laveda Abbe, [place] Clinical Social Worker [phone]

## 2016-10-21 LAB — GLUCOSE, CAPILLARY
GLUCOSE-CAPILLARY: 170 mg/dL — AB (ref 65–99)
Glucose-Capillary: 209 mg/dL — ABNORMAL HIGH (ref 65–99)
Glucose-Capillary: 167 mg/dL — ABNORMAL HIGH (ref 65–99)
Glucose-Capillary: 131 mg/dL — ABNORMAL HIGH (ref 65–99)

## 2016-10-21 NOTE — Progress Notes (Signed)
PROGRESS NOTE    Sherry [name]  QPY:[id number] DOB: 03/07/1927 DOA: 10/05/2016 PCP: [name], MD    Brief Narrative:  83 yo female withhistory ofhypertension, type 2 diabetes mellitus, chronic kidney disease, pulmonary embolism, dyslipidemia and suspected dementia. Patient had a recent hospitalization for encephalopathy due to severe hypoglycemia. She was transferred from the nursing home this time back to the hospital due to altered mentation. She was not able to give any detailed history on admission. On initial physical examination blood pressure 173/74, heart rate 89, respiratory rate 17, oxygen saturation 100%. Dry mucous membranes, decreased breath sounds bilaterally with positive rales, heart S1-S2 present rhythmic, the abdomen had tenderness in the mid epigastrium, no guarding or rebound, no lower extremity edema. Sodium 140, potassium 3.3, chloride 105, bicarbonate 12, glucose 294, BUN 11, creatinine 1.38, white count 3.6, hemoglobin 10.9, hematocrit 34.3, platelets 394, , urinalysis with 6-30 white cells, 100 protein, specific gravity 1.020, urine streptococcal pneumonia antigen positive. Head CT with diffuse cortical atrophy. Chest x-ray with right basal opacity. EKG sinus rhythm, left axis deviation, left bundle-branch block, T-wave inversions in lateral leads, poor R-wave progression. Left ventricular hypertrophy.  Patient admitted to the hospital working diagnosis of healthcare associated pneumonia complicated by metabolic encephalopathy.  Patient with prolonged hospital stay, she has been refusing her medications, palliative care consulted and pending placement.   Assessment & Plan:   Principal Problem:   Pneumonia Active Problems:   Chronic kidney disease (CKD), stage III (moderate) (HCC)   Essential hypertension   Nausea & vomiting   1. Healthcare associated pneumonia and urinary tract infection. Resolved.   2. Metabolic encephalopathy. Patient not interactive but  no reported agitation. Has been refusing po intake, including medications.    3. Generalized deconditioning, severe malnutrition, calorie-protein. On nutritional supplements, that she has been declining. Will need placement, follow with social services.   6. Chronic kidney disease stage III with right renal mass. Poor oral intake, will check renal panel in am.   7. Atrial fibrillation/ paroxysmal. Hear rate 70 to 80, on amiodarone and metoprolol plusapixaban for anticoagulation.   8. Type 2 diabetes mellitus. Continue with insulin sliding scale for glucose cover and monitoring. Capillary glucose 131, 165, and 209. Very poor oral intake.  9. Hypertension. Continue with  Amlodipine, systolic blood pressure 124 to 130  10. History of pulmonary embolism.Onapixaban for anticoagulation, but has declined medication since 10/06.   Patient waiting for placement.    DVT prophylaxis:apixaban Code Status:dnr Family Communication: Disposition Plan:   Consultants:    Procedures:  Subjective: Patient with nausea this am, mild to moderate with no vomiting, no chest pain or dyspnea. No abdominal pain or diarrhea. Patient has been declining medications for the last 8 days.   Objective: Vitals:   10/20/16 1501 10/20/16 1600 10/20/16 2157 10/21/16 0529  BP: (!) 85/24 (!) 122/42 (!) 136/57 (!) 127/48  Pulse: 73  70 74  Resp: 16  16 14   Temp: (!) 97.3 F (36.3 C)  98.2 F (36.8 C) 98.1 F (36.7 C)  TempSrc: Axillary  Oral Oral  SpO2: 100%  99% 100%  Weight:      Height:        Intake/Output Summary (Last 24 hours) at 10/21/16 1220 Last data filed at 10/21/16 1216  Gross per 24 hour  Intake              240 ml  Output  300 ml  Net              -60 ml   Filed Weights   10/05/16 2049  Weight: 48.7 kg (107 lb 4.8 oz)    Examination:  General: Not in pain or dyspnea, deconditioned Neurology: Awake and alert, non focal  E ENT: mild pallor, no  icterus, oral mucosa moist Cardiovascular: No JVD. S1-S2 present, rhythmic, no gallops, rubs, or murmurs. No lower extremity edema. Pulmonary: vesicular breath sounds bilaterally, adequate air movement, no wheezing, rhonchi or rales. Gastrointestinal. Abdomen flat, no organomegaly, non tender, no rebound or guarding Skin. No rashes Musculoskeletal: no joint deformities     Data Reviewed: I have personally reviewed following labs and imaging studies  CBC: No results for input(s): WBC, NEUTROABS, HGB, HCT, MCV, PLT in the last 168 hours. Basic Metabolic Panel:  Recent Labs Lab 10/18/16 1304 10/19/16 0926  NA 149* 149*  K 4.1 4.1  CL 112* 115*  CO2 25 23  GLUCOSE 243* 262*  BUN 16 19  CREATININE 1.17* 1.20*  CALCIUM 8.0* 8.0*   GFR: Estimated Creatinine Clearance: 25.4 mL/min (A) (by C-G formula based on SCr of 1.2 mg/dL (H)). Liver Function Tests: No results for input(s): AST, ALT, ALKPHOS, BILITOT, PROT, ALBUMIN in the last 168 hours. No results for input(s): LIPASE, AMYLASE in the last 168 hours. No results for input(s): AMMONIA in the last 168 hours. Coagulation Profile: No results for input(s): INR, PROTIME in the last 168 hours. Cardiac Enzymes: No results for input(s): CKTOTAL, CKMB, CKMBINDEX, TROPONINI in the last 168 hours. BNP (last 3 results) No results for input(s): PROBNP in the last 8760 hours. HbA1C: No results for input(s): HGBA1C in the last 72 hours. CBG:  Recent Labs Lab 10/20/16 0823 10/20/16 1253 10/20/16 1810 10/20/16 2156 10/21/16 0801  GLUCAP 199* 179* 131* 165* 209*   Lipid Profile: No results for input(s): CHOL, HDL, LDLCALC, TRIG, CHOLHDL, LDLDIRECT in the last 72 hours. Thyroid Function Tests: No results for input(s): TSH, T4TOTAL, FREET4, T3FREE, THYROIDAB in the last 72 hours. Anemia Panel: No results for input(s): VITAMINB12, FOLATE, FERRITIN, TIBC, IRON, RETICCTPCT in the last 72 hours.    Radiology Studies: I have reviewed  all of the imaging during this hospital visit personally     Scheduled Meds: . amiodarone  200 mg Oral Daily  . amLODipine  10 mg Oral Daily  . apixaban  2.5 mg Oral BID  . atorvastatin  20 mg Oral q1800  . docusate sodium  100 mg Oral BID  . feeding supplement (ENSURE ENLIVE)  237 mL Oral TID BM  . gabapentin  200 mg Oral QHS  . insulin aspart  0-9 Units Subcutaneous TID WC  . nystatin  5 mL Oral QID  . pantoprazole  40 mg Oral BID  . polyethylene glycol  17 g Oral Daily  . sucralfate  1 g Oral TID WC & HS  . tamsulosin  0.4 mg Oral QPC breakfast   Continuous Infusions:   LOS: 16 days        Kenyan Karnes Gerome Apley, MD [facility] Pager [phone]

## 2016-10-21 NOTE — Progress Notes (Signed)
Md in for rounds , made aware of patient decreased po ntake and patient been refusing meds for a couple of days.

## 2016-10-22 DIAGNOSIS — R4182 Altered mental status, unspecified: Secondary | ICD-10-CM

## 2016-10-22 DIAGNOSIS — R410 Disorientation, unspecified: Secondary | ICD-10-CM

## 2016-10-22 DIAGNOSIS — E870 Hyperosmolality and hypernatremia: Secondary | ICD-10-CM

## 2016-10-22 DIAGNOSIS — F0150 Vascular dementia without behavioral disturbance: Secondary | ICD-10-CM

## 2016-10-22 LAB — BASIC METABOLIC PANEL
ANION GAP: 12 (ref 5–15)
BUN: 19 mg/dL (ref 6–20)
CALCIUM: 8 mg/dL — AB (ref 8.9–10.3)
CO2: 27 mmol/L (ref 22–32)
CREATININE: 1.36 mg/dL — AB (ref 0.44–1.00)
Chloride: 111 mmol/L (ref 101–111)
GFR, EST AFRICAN AMERICAN: 39 mL/min — AB (ref >60)
GFR, EST NON AFRICAN AMERICAN: 34 mL/min — AB (ref >60)
Glucose, Bld: 138 mg/dL — ABNORMAL HIGH (ref 65–99)
Potassium: 2.3 mmol/L — CL (ref 3.5–5.1)
SODIUM: 150 mmol/L — AB (ref 135–145)

## 2016-10-22 LAB — GLUCOSE, CAPILLARY
GLUCOSE-CAPILLARY: 151 mg/dL — AB (ref 65–99)
GLUCOSE-CAPILLARY: 182 mg/dL — AB (ref 65–99)
GLUCOSE-CAPILLARY: 166 mg/dL — AB (ref 65–99)
Glucose-Capillary: 162 mg/dL — ABNORMAL HIGH (ref 65–99)

## 2016-10-22 MED ORDER — POTASSIUM CHLORIDE 10 MEQ/100ML IV SOLN: 10.0000 meq | INTRAVENOUS | Status: AC

## 2016-10-22 MED ORDER — SODIUM CHLORIDE 0.45 % IV SOLN: INTRAVENOUS | Status: DC

## 2016-10-22 MED ORDER — MAGNESIUM SULFATE IN D5W 1-5 GM/100ML-% IV SOLN
1.0000 g | Freq: Once | INTRAVENOUS | Status: DC
  Filled 2016-10-22: qty 100

## 2016-10-22 MED ORDER — POTASSIUM CHLORIDE CRYS ER 20 MEQ PO TBCR: 40.0000 meq | EXTENDED_RELEASE_TABLET | ORAL | Status: AC

## 2016-10-22 NOTE — Progress Notes (Signed)
Patient refused all meds including ordered insulin.  Nurse educated patient on the risk of not taking ordered meds.  Patient continued to refuse med administration. Will continue to monitor.

## 2016-10-22 NOTE — Progress Notes (Signed)
New orders for fluids to be infused for patient.  Patient does not have IV access and refused to let nurse place an IV.  Will continue to monitor.

## 2016-10-22 NOTE — Consult Note (Signed)
[facility] Consult   Reason for Consult:  depression Referring Physician:  Dr. Cathlean Sauer Patient Identification: Sherry [name] MRN:  [id number] Principal Diagnosis: Pneumonia Diagnosis:   Patient Active Problem List   Diagnosis Date Noted  . Pneumonia [J18.9] 10/05/2016  . Hematemesis without nausea [K92.0]   . Hypothermia [T68.XXXA] 09/13/2016  . Acute encephalopathy [G93.40] 09/05/2016  . Hypoglycemia [E16.2] 09/05/2016  . Chronic systolic CHF (congestive heart failure) ([place]) [I50.22] 09/05/2016  . Syncope [R55] 08/29/2016  . Chronic idiopathic constipation [K59.04]   . HLD (hyperlipidemia) [E78.5] 07/28/2016  . Fecal impaction of colon ([place]) [K56.41] 07/28/2016  . Fecal impaction ([place]) [K56.41] 07/28/2016  . Atrial fibrillation with RVR ([place]) [I48.91] 07/22/2016  . Dehydration [E86.0]   . Renal mass [N28.89]   . Chest pain, rule out acute myocardial infarction [R07.9] 07/12/2016  . Reactive depression [F32.9]   . Esophagitis determined by endoscopy [K20.9] 05/31/2016  . Hyperglycemia [R73.9]   . Hypokalemia [E87.6]   . Cystitis [N30.90]   . HH (hiatus hernia) [K44.9]   . Hiatal hernia with gastroesophageal reflux disease without esophagitis [K44.9, K21.9]   . GI bleed [K92.2] 05/28/2016  . Upper GI bleeding [K92.2] 05/28/2016  . Pressure injury of skin [L89.90] 05/28/2016  . Atrial flutter ([place]) [I48.92]   . Gastroenteritis [K52.9] 04/24/2016  . Aphasia [R47.01] 04/18/2016  . Nausea & vomiting [R11.2] 03/27/2016  . Advance care planning [Z71.89]   . UTI (urinary tract infection) [N39.0] 03/12/2016  . Chest pain [R07.9] 03/09/2016  . PE (pulmonary thromboembolism) ([place]) [I26.99] 02/26/2016  . Diabetes mellitus type 2, insulin dependent ([place]) [E11.9, Z79.4] 02/26/2016  . Anemia [D64.9] 02/26/2016  . Chronic kidney disease (CKD), stage III (moderate) ([place]) [N18.3] 02/26/2016  . Essential hypertension [I10] 02/26/2016  . Goals of care, counseling/discussion  [Z71.89]   . Adult failure to thrive syndrome [R62.7]   . Palliative care by specialist [Z51.5]   . Sepsis ([place]) [A41.9] 11/26/2014    Total Time spent with patient: 15 minutes  Subjective:   Sherry [name] is a 83 y.o. female patient admitted with AMS.  HPI:  Sherry [name] is an 83 year old female, with dementia and multiple medical problems, admitted for altered mental status. She presents with disorientation on my interview and examination, and repeatedly states that she wants to go home and she wants to be with her sons. She denies any intentions to harm herself. She is quite hypoactive and periodically stares blankly throughout our interaction. She quickly fell back asleep after we spoke.  Past Psychiatric History: none  Risk to Self: Is patient at risk for suicide?: No Risk to Others:   Prior Inpatient Therapy:   Prior Outpatient Therapy:    Past Medical History:  Past Medical History:  Diagnosis Date  . Anxiety   . Arthritis   . Chronic kidney disease   . Diabetes mellitus without complication ([place])   . Dizziness   . Dyspnea   . GERD (gastroesophageal reflux disease)   . Hyperlipidemia   . Hypertension   . Pulmonary embolism ([place]) 2016    Past Surgical History:  Procedure Laterality Date  . APPENDECTOMY    . BACK SURGERY    . ESOPHAGOGASTRODUODENOSCOPY N/A 05/29/2016   Procedure: ESOPHAGOGASTRODUODENOSCOPY (EGD);  Surgeon: Clarene Essex, MD;  Location: [facility];  Service: Gastroenterology;  Laterality: N/A;  . SHOULDER SURGERY Left    Family History:  Family History  Problem Relation Age of Onset  . Diabetes Mother   . Cancer Mother   .  Cancer Father   . Bladder Cancer Neg Hx   . Kidney cancer Neg Hx    Family Psychiatric  History: n/a  Social History:  History  Alcohol Use No     History  Drug Use No    Social History   Social History  . Marital status: Single    Spouse name: N/A  . Number of children: N/A  . Years of education: N/A   Social  History Main Topics  . Smoking status: Never Smoker  . Smokeless tobacco: Never Used  . Alcohol use No  . Drug use: No  . Sexual activity: No   Other Topics Concern  . None   Social History Narrative  . None   Additional Social History:    Allergies:  No Known Allergies  Labs:  Results for orders placed or performed during the hospital encounter of 10/05/16 (from the past 48 hour(s))  Glucose, capillary     Status: Abnormal   Collection Time: 10/20/16  6:10 PM  Result Value Ref Range   Glucose-Capillary 131 (H) 65 - 99 mg/dL  Glucose, capillary     Status: Abnormal   Collection Time: 10/20/16  9:56 PM  Result Value Ref Range   Glucose-Capillary 165 (H) 65 - 99 mg/dL  Glucose, capillary     Status: Abnormal   Collection Time: 10/21/16  8:01 AM  Result Value Ref Range   Glucose-Capillary 209 (H) 65 - 99 mg/dL  Glucose, capillary     Status: Abnormal   Collection Time: 10/21/16 12:12 PM  Result Value Ref Range   Glucose-Capillary 167 (H) 65 - 99 mg/dL  Glucose, capillary     Status: Abnormal   Collection Time: 10/21/16  2:12 PM  Result Value Ref Range   Glucose-Capillary 131 (H) 65 - 99 mg/dL  Glucose, capillary     Status: Abnormal   Collection Time: 10/21/16  5:25 PM  Result Value Ref Range   Glucose-Capillary 170 (H) 65 - 99 mg/dL  Basic metabolic panel     Status: Abnormal   Collection Time: 10/22/16  4:22 AM  Result Value Ref Range   Sodium 150 (H) 135 - 145 mmol/L   Potassium 2.3 (LL) 3.5 - 5.1 mmol/L    Comment: CRITICAL RESULT CALLED TO, READ BACK BY AND VERIFIED WITH: GEBRU G,RN 10/22/16 0531 WAYK    Chloride 111 101 - 111 mmol/L   CO2 27 22 - 32 mmol/L   Glucose, Bld 138 (H) 65 - 99 mg/dL   BUN 19 6 - 20 mg/dL   Creatinine, Ser 1.36 (H) 0.44 - 1.00 mg/dL   Calcium 8.0 (L) 8.9 - 10.3 mg/dL   GFR calc non Af Amer 34 (L) >60 mL/min   GFR calc Af Amer 39 (L) >60 mL/min    Comment: (NOTE) The eGFR has been calculated using the CKD EPI equation. This  calculation has not been validated in all clinical situations. eGFR's persistently <60 mL/min signify possible Chronic Kidney Disease.    Anion gap 12 5 - 15  Glucose, capillary     Status: Abnormal   Collection Time: 10/22/16  8:38 AM  Result Value Ref Range   Glucose-Capillary 151 (H) 65 - 99 mg/dL  Glucose, capillary     Status: Abnormal   Collection Time: 10/22/16  1:22 PM  Result Value Ref Range   Glucose-Capillary 182 (H) 65 - 99 mg/dL    Current Facility-Administered Medications  Medication Dose Route Frequency Provider Last Rate Last Dose  .  acetaminophen (TYLENOL) tablet 650 mg  650 mg Oral Q6H PRN Aline August, MD       Or  . acetaminophen (TYLENOL) suppository 650 mg  650 mg Rectal Q6H PRN Starla Link, Kshitiz, MD      . amiodarone (PACERONE) tablet 200 mg  200 mg Oral Daily Starla Link, Kshitiz, MD   200 mg at 10/13/16 1055  . amLODipine (NORVASC) tablet 10 mg  10 mg Oral Daily Aline August, MD   10 mg at 10/14/16 1037  . apixaban (ELIQUIS) tablet 2.5 mg  2.5 mg Oral BID Aline August, MD   2.5 mg at 10/14/16 1036  . atorvastatin (LIPITOR) tablet 20 mg  20 mg Oral q1800 Aline August, MD   20 mg at 10/10/16 1728  . bisacodyl (DULCOLAX) EC tablet 10 mg  10 mg Oral Daily PRN Alekh, Kshitiz, MD      . feeding supplement (ENSURE ENLIVE) (ENSURE ENLIVE) liquid 237 mL  237 mL Oral TID BM Domenic Polite, MD   237 mL at 10/12/16 1445  . hydrALAZINE (APRESOLINE) injection 5 mg  5 mg Intravenous Q6H PRN Aline August, MD   5 mg at 10/06/16 1724  . insulin aspart (novoLOG) injection 0-9 Units  0-9 Units Subcutaneous TID WC Arrien, Jimmy Picket, MD   2 Units at 10/21/16 1819  . nystatin (MYCOSTATIN) 100000 UNIT/ML suspension 500,000 Units  5 mL Oral QID Domenic Polite, MD      . ondansetron [facility]) tablet 4 mg  4 mg Oral Q6H PRN Aline August, MD   4 mg at 10/19/16 1845   Or  . ondansetron (ZOFRAN) injection 4 mg  4 mg Intravenous Q6H PRN Aline August, MD   4 mg at 10/06/16 0538   . oxyCODONE-acetaminophen (PERCOCET) 7.5-325 MG per tablet 1 tablet  1 tablet Oral BID PRN Aline August, MD   1 tablet at 10/05/16 2232  . pantoprazole (PROTONIX) EC tablet 40 mg  40 mg Oral BID Aline August, MD   40 mg at 10/14/16 1036  . polyethylene glycol (MIRALAX / GLYCOLAX) packet 17 g  17 g Oral Daily Starla Link, Kshitiz, MD   17 g at 10/14/16 1034  . potassium chloride SA (K-DUR,KLOR-CON) CR tablet 40 mEq  40 mEq Oral Q4H Arrien, Jimmy Picket, MD      . sucralfate (CARAFATE) tablet 1 g  1 g Oral TID WC & HS Starla Link, Kshitiz, MD   1 g at 10/19/16 0815  . tamsulosin (FLOMAX) capsule 0.4 mg  0.4 mg Oral QPC breakfast Aline August, MD   0.4 mg at 10/19/16 0815    Musculoskeletal: Strength & Muscle Tone: decreased Gait & Station: unable to stand Patient leans: N/A  Psychiatric Specialty Exam: Physical Exam  Review of Systems  Unable to perform ROS: Mental status change    Blood pressure (!) 132/45, pulse 72, temperature 98.6 F (37 C), temperature source Oral, resp. rate 16, height 5' 3"  (1.6 m), weight 48.7 kg (107 lb 4.8 oz), SpO2 (!) 10 %.Body mass index is 19.01 kg/m.  General Appearance: Fairly Groomed  Eye Contact:  Minimal  Speech:  Slow  Volume:  Decreased  Mood:  Dysphoric  Affect:  Constricted  Thought Process:  Coherent  Orientation:  Negative  Thought Content:  Rumination  Suicidal Thoughts:  No  Homicidal Thoughts:  No  Memory:  Immediate;   Poor  Judgement:  Impaired  Insight:  Shallow  Psychomotor Activity:  Decreased  Concentration:  Concentration: Poor  Recall:  Poor  Fund of  Knowledge:  Poor  Language:  Poor  Akathisia:  Negative  Handed:  na  AIMS (if indicated):     Assets:  Social Support  ADL's:  Impaired  Cognition:  Impaired,  Severe  Sleep:       Treatment Plan Summary:  Sherry [name] is a 83 year old female with dementia and multiple medical problems, currently presenting with suspected hypoactive delirium, in the setting of a  neurocognitive disorder. Based on my review of the record, and my interaction with the patient, this would most appropriately be a patient to consult with palliative care, which I see that the primary team has ordered. She does not have any acute psychiatric needs. I recommend close consultation with palliative care and the family. Psychiatry signing off at this time.   Disposition: No evidence of imminent risk to self or others at present.   Patient does not meet criteria for psychiatric inpatient admission.  Aundra Dubin, MD 10/22/2016 2:41 PM

## 2016-10-22 NOTE — Progress Notes (Signed)
PROGRESS NOTE    Sherry [name]  SAY:[id number] DOB: 16-Aug-1927 DOA: 10/05/2016 PCP: [name], MD    Brief Narrative:  83 yo female withhistory ofhypertension, type 2 diabetes mellitus, chronic kidney disease, pulmonary embolism, dyslipidemia and suspected dementia. Patient had a recent hospitalization for encephalopathy due to severe hypoglycemia. She was transferred from the nursing home this time back to the hospital due to altered mentation. She was not able to give any detailed history on admission. On initial physical examination blood pressure 173/74, heart rate 89, respiratory rate 17, oxygen saturation 100%. Dry mucous membranes, decreased breath sounds bilaterally with positive rales, heart S1-S2 present rhythmic, the abdomen had tenderness in the mid epigastrium, no guarding or rebound, no lower extremity edema. Sodium 140, potassium 3.3, chloride 105, bicarbonate 12, glucose 294, BUN 11, creatinine 1.38, white count 3.6, hemoglobin 10.9, hematocrit 34.3, platelets 394, , urinalysis with 6-30 white cells, 100 protein, specific gravity 1.020, urine streptococcal pneumonia antigen positive. Head CT with diffuse cortical atrophy. Chest x-ray with right basal opacity. EKG sinus rhythm, left axis deviation, left bundle-branch block, T-wave inversions in lateral leads, poor R-wave progression. Left ventricular hypertrophy.  Patient admitted to the hospital working diagnosis of healthcare associated pneumonia complicated by metabolic encephalopathy.  Patient with prolonged hospital stay, she has been refusing her medications, palliative care consulted and pending placement.    Assessment & Plan:   Principal Problem:   Pneumonia Active Problems:   Chronic kidney disease (CKD), stage III (moderate) (HCC)   Essential hypertension   Nausea & vomiting  1. Healthcare associated pneumonia and urinary tract infection. Resolved.   2. Metabolic encephalopathy.Patient responding to  questions and following commands, still refusing to eat, but has agreed to take her scheduled medications. Concerned for depression, will consult psychiatry inpatient consult for further recommendations. If patient continue to refuse her meals and medications, high risk for worsening medical conditions and exacerbation of chronic problems.    3. Generalized deconditioning, severe malnutrition, calorie-protein. On nutritional supplements, that she has continued to decline.   6. Chronic kidney disease stage III with right renal mass. Continue to have very poor oral intake, now with worsening hypernatremia, hypokalemia, and renal function. Will order IV fluids with half NS and follow renal panel in am.   7. Atrial fibrillation/ paroxysmal. Hear rate 70's, will continue to encourage to take medications inlcudingamiodarone and metoprolol plusapixaban for anticoagulation.   8. Type 2 diabetes mellitus. Continue with insulin sliding scale for glucose cover and monitoring. Capillary glucose 170, 151, 182 Very poor oral intake. Has been refusing insulin.   9. Hypertension. Blood pressure 110 to 130, patient refusing amlodipine. Will discontinue this agent for now. Blood pressure is controlled even she not taking amlodipine.   10. History of pulmonary embolism.No chest pain or hypoxemia, patient refusing anticoagulation.   Patient waiting for placement.    DVT prophylaxis:apixaban Code Status:dnr Family Communication: Disposition Plan:   Consultants:     Antimicrobials:      Subjective: Patient with no chest pain or dyspnea, noted to be very weak, not able to get off the bed by herself, continue to refuse medications and meals. Reported no appetite, no nausea or vomiting, no abdominal pain or diarrhea.   Objective: Vitals:   10/21/16 0529 10/21/16 1526 10/21/16 2115 10/22/16 0420  BP: (!) 127/48 (!) 110/47 (!) 112/39 (!) 126/49  Pulse: 74 80 74 73  Resp: 14 15 16  16   Temp: 98.1 F (36.7 C) 97.7 F (36.5 C)  98 F (36.7 C) 97.7 F (36.5 C)  TempSrc: Oral Oral Oral Oral  SpO2: 100% 100% 100% 100%  Weight:      Height:        Intake/Output Summary (Last 24 hours) at 10/22/16 1116 Last data filed at 10/22/16 0010  Gross per 24 hour  Intake              222 ml  Output              200 ml  Net               22 ml   Filed Weights   10/05/16 2049  Weight: 48.7 kg (107 lb 4.8 oz)    Examination:   General: Not in pain or dyspnea, deconditioned Neurology: Awake and alert, non focal  E ENT: posisitve pallor, no icterus, oral mucosa moist Cardiovascular: No JVD. S1-S2 present, rhythmic, no gallops, rubs, or murmurs. No lower extremity edema. Pulmonary: vesicular breath sounds bilaterally, adequate air movement, no wheezing, rhonchi or rales. Gastrointestinal. Abdomen flat, no organomegaly, non tender, no rebound or guarding Skin. No rashes Musculoskeletal: no joint deformities     Data Reviewed: I have personally reviewed following labs and imaging studies  CBC: No results for input(s): WBC, NEUTROABS, HGB, HCT, MCV, PLT in the last 168 hours. Basic Metabolic Panel:  Recent Labs Lab 10/18/16 1304 10/19/16 0926 10/22/16 0422  NA 149* 149* 150*  K 4.1 4.1 2.3*  CL 112* 115* 111  CO2 25 23 27   GLUCOSE 243* 262* 138*  BUN 16 19 19   CREATININE 1.17* 1.20* 1.36*  CALCIUM 8.0* 8.0* 8.0*   GFR: Estimated Creatinine Clearance: 22.4 mL/min (A) (by C-G formula based on SCr of 1.36 mg/dL (H)). Liver Function Tests: No results for input(s): AST, ALT, ALKPHOS, BILITOT, PROT, ALBUMIN in the last 168 hours. No results for input(s): LIPASE, AMYLASE in the last 168 hours. No results for input(s): AMMONIA in the last 168 hours. Coagulation Profile: No results for input(s): INR, PROTIME in the last 168 hours. Cardiac Enzymes: No results for input(s): CKTOTAL, CKMB, CKMBINDEX, TROPONINI in the last 168 hours. BNP (last 3 results) No results  for input(s): PROBNP in the last 8760 hours. HbA1C: No results for input(s): HGBA1C in the last 72 hours. CBG:  Recent Labs Lab 10/21/16 0801 10/21/16 1212 10/21/16 1412 10/21/16 1725 10/22/16 0838  GLUCAP 209* 167* 131* 170* 151*   Lipid Profile: No results for input(s): CHOL, HDL, LDLCALC, TRIG, CHOLHDL, LDLDIRECT in the last 72 hours. Thyroid Function Tests: No results for input(s): TSH, T4TOTAL, FREET4, T3FREE, THYROIDAB in the last 72 hours. Anemia Panel: No results for input(s): VITAMINB12, FOLATE, FERRITIN, TIBC, IRON, RETICCTPCT in the last 72 hours.    Radiology Studies: I have reviewed all of the imaging during this hospital visit personally     Scheduled Meds: . amiodarone  200 mg Oral Daily  . amLODipine  10 mg Oral Daily  . apixaban  2.5 mg Oral BID  . atorvastatin  20 mg Oral q1800  . docusate sodium  100 mg Oral BID  . feeding supplement (ENSURE ENLIVE)  237 mL Oral TID BM  . gabapentin  200 mg Oral QHS  . insulin aspart  0-9 Units Subcutaneous TID WC  . nystatin  5 mL Oral QID  . pantoprazole  40 mg Oral BID  . polyethylene glycol  17 g Oral Daily  . sucralfate  1 g Oral TID WC & HS  .  tamsulosin  0.4 mg Oral QPC breakfast   Continuous Infusions: . magnesium sulfate 1 - 4 g bolus IVPB       LOS: 17 days        Ramsie Ostrander Gerome Apley, MD [facility] Pager [phone]

## 2016-10-22 NOTE — Progress Notes (Signed)
Pt is alert and oriented X 4, refusing all medications and food.She only drinks water and sprite in couple of sips.Potassuim is 2.4 and  MD contacted.Pottasuim and magnissium was ordered and pt. didn't have an iv and didn't want iv to be inserted.

## 2016-10-22 NOTE — Progress Notes (Signed)
Reported to MD Arrien that patient refused all meds.  Dr stated he had spoke with patient about needing to take meds and that she agreed to take the ordered meds that were pills.  When I attempted to give pill meds, patient refused to take them.  Will continue to monitor.

## 2016-10-23 DIAGNOSIS — E876 Hypokalemia: Secondary | ICD-10-CM

## 2016-10-23 LAB — BASIC METABOLIC PANEL
Anion gap: 14 (ref 5–15)
BUN: 21 mg/dL — AB (ref 6–20)
CO2: 26 mmol/L (ref 22–32)
CREATININE: 1.36 mg/dL — AB (ref 0.44–1.00)
Calcium: 8 mg/dL — ABNORMAL LOW (ref 8.9–10.3)
Chloride: 107 mmol/L (ref 101–111)
GFR calc Af Amer: 39 mL/min — ABNORMAL LOW (ref >60)
GFR, EST NON AFRICAN AMERICAN: 34 mL/min — AB (ref >60)
GLUCOSE: 168 mg/dL — AB (ref 65–99)
POTASSIUM: 2.4 mmol/L — AB (ref 3.5–5.1)
SODIUM: 147 mmol/L — AB (ref 135–145)

## 2016-10-23 LAB — GLUCOSE, CAPILLARY
GLUCOSE-CAPILLARY: 181 mg/dL — AB (ref 65–99)
Glucose-Capillary: 170 mg/dL — ABNORMAL HIGH (ref 65–99)
Glucose-Capillary: 271 mg/dL — ABNORMAL HIGH (ref 65–99)
Glucose-Capillary: 217 mg/dL — ABNORMAL HIGH (ref 65–99)

## 2016-10-23 MED ORDER — POTASSIUM CHLORIDE 10 MEQ/100ML IV SOLN: 10.0000 meq | INTRAVENOUS | Status: AC

## 2016-10-23 MED ORDER — POTASSIUM CHLORIDE 20 MEQ PO PACK
40.0000 meq | PACK | ORAL | Status: AC
  Administered 2016-10-23: 40 meq via ORAL
  Filled 2016-10-23 (×4): qty 2

## 2016-10-23 MED ORDER — MAGNESIUM SULFATE 2 GM/50ML IV SOLN
2.0000 g | Freq: Once | INTRAVENOUS | Status: DC
  Filled 2016-10-23: qty 50

## 2016-10-23 MED ORDER — POTASSIUM CHLORIDE CRYS ER 20 MEQ PO TBCR: 20.0000 meq | EXTENDED_RELEASE_TABLET | Freq: Once | ORAL | Status: DC

## 2016-10-23 NOTE — Progress Notes (Signed)
PROGRESS NOTE    Sherry [name]  JTT:[id number] DOB: [birth year] DOA: 10/05/2016 PCP: [name], MD    Brief Narrative:  83 yo female withhistory ofhypertension, type 2 diabetes mellitus, chronic kidney disease, pulmonary embolism, dyslipidemia and suspected dementia. Patient had a recent hospitalization for encephalopathy due to severe hypoglycemia. She was transferred from the nursing home this time back to the hospital due to altered mentation. She was not able to give any detailed history on admission. On initial physical examination blood pressure 173/74, heart rate 89, respiratory rate 17, oxygen saturation 100%. Dry mucous membranes, decreased breath sounds bilaterally with positive rales, heart S1-S2 present rhythmic, the abdomen had tenderness in the mid epigastrium, no guarding or rebound, no lower extremity edema. Sodium 140, potassium 3.3, chloride 105, bicarbonate 12, glucose 294, BUN 11, creatinine 1.38, white count 3.6, hemoglobin 10.9, hematocrit 34.3, platelets 394, , urinalysis with 6-30 white cells, 100 protein, specific gravity 1.020, urine streptococcal pneumonia antigen positive. Head CT with diffuse cortical atrophy. Chest x-ray with right basal opacity. EKG sinus rhythm, left axis deviation, left bundle-branch block, T-wave inversions in lateral leads, poor R-wave progression. Left ventricular hypertrophy.  Patient admitted to the hospital working diagnosis of healthcare associated pneumonia complicated by metabolic encephalopathy.  Patient with prolonged hospital stay, she has been refusing her medications, palliative care consulted and pending placement.   Assessment & Plan:   Principal Problem:   Pneumonia Active Problems:   Chronic kidney disease (CKD), stage III (moderate) (HCC)   Essential hypertension   Nausea & vomiting   1. Metabolic encephalopathy in the setting of dementia. Patient is more awake and alert, still refusing all medications and IV fluids.  Has agreed to take po potassium. I was not able to reach any family member over the phone. Will re-consult palliative. No active psychiatric problem. Follow with social services for discharge planing. Will limit medical regimen to the most essential medications, to improve compliance.   2. Generalized deconditioning, severe malnutrition, calorie-protein.Orderednutritional supplements. Not taking.   3. Chronic kidney disease stage III with right renal mass.  Poor oral intake, serum cr stable at 1,36, will continue po kcl, for repletion. Patient refusing IV, check renal panel in am.  4. Atrial fibrillation/ paroxysmal. Controlled HR, patient declined amiodarone and apixaban.   5. Type 2 diabetes mellitus. Ordered insulin sliding scale for glucose cover and monitoring. Capillary glucose  182, 162, 166, 170, 271.   6. Hypertension. Blood pressure 009 systolic, patient refusing amlodipine.   7. History of pulmonary embolism. Asymptomatic, refusing apixaban. I explained patient that not taking her medications can be life threatening.   8. Healthcare associated pneumonia and urinary tract infection.Resolved. Completed antibiotic therapy.   Patient waiting for placement.    DVT prophylaxis:apixaban Code Status:dnr Family Communication: Disposition Plan:   Consultants:     Antimicrobials:       Subjective: Patient more awake and interactive today, still refusing po meds and IV fluids, no nausea or vomiting, no abdominal pain or dyspnea. Willing to go home.   Objective: Vitals:   10/22/16 0420 10/22/16 1325 10/22/16 2130 10/23/16 0536  BP: (!) 126/49 (!) 132/45 139/74 (!) 140/48  Pulse: 73 72 66 73  Resp: 16 16 16 16   Temp: 97.7 F (36.5 C) 98.6 F (37 C) 98.1 F (36.7 C) 98.5 F (36.9 C)  TempSrc: Oral Oral Oral Oral  SpO2: 100% (!) 10% 93% 100%  Weight:      Height:  Intake/Output Summary (Last 24 hours) at 10/23/16 1158 Last data filed  at 10/23/16 0906  Gross per 24 hour  Intake              200 ml  Output              450 ml  Net             -250 ml   Filed Weights   10/05/16 2049  Weight: 48.7 kg (107 lb 4.8 oz)    Examination:   General: Not in pain or dyspnea, deconditioned Neurology: Awake and alert, non focal  E ENT: mild pallor, no icterus, oral mucosa moist Cardiovascular: No JVD. S1-S2 present, rhythmic, no gallops, rubs, or murmurs. No lower extremity edema. Pulmonary: vesicular breath sounds bilaterally, adequate air movement, no wheezing, rhonchi or rales. Gastrointestinal. Abdomen flat, no organomegaly, non tender, no rebound or guarding Skin. No rashes Musculoskeletal: no joint deformities     Data Reviewed: I have personally reviewed following labs and imaging studies  CBC: No results for input(s): WBC, NEUTROABS, HGB, HCT, MCV, PLT in the last 168 hours. Basic Metabolic Panel:  Recent Labs Lab 10/18/16 1304 10/19/16 0926 10/22/16 0422 10/23/16 0428  NA 149* 149* 150* 147*  K 4.1 4.1 2.3* 2.4*  CL 112* 115* 111 107  CO2 25 23 27 26   GLUCOSE 243* 262* 138* 168*  BUN 16 19 19  21*  CREATININE 1.17* 1.20* 1.36* 1.36*  CALCIUM 8.0* 8.0* 8.0* 8.0*   GFR: Estimated Creatinine Clearance: 22.4 mL/min (A) (by C-G formula based on SCr of 1.36 mg/dL (H)). Liver Function Tests: No results for input(s): AST, ALT, ALKPHOS, BILITOT, PROT, ALBUMIN in the last 168 hours. No results for input(s): LIPASE, AMYLASE in the last 168 hours. No results for input(s): AMMONIA in the last 168 hours. Coagulation Profile: No results for input(s): INR, PROTIME in the last 168 hours. Cardiac Enzymes: No results for input(s): CKTOTAL, CKMB, CKMBINDEX, TROPONINI in the last 168 hours. BNP (last 3 results) No results for input(s): PROBNP in the last 8760 hours. HbA1C: No results for input(s): HGBA1C in the last 72 hours. CBG:  Recent Labs Lab 10/22/16 0838 10/22/16 1322 10/22/16 1720 10/22/16 2140  10/23/16 0802  GLUCAP 151* 182* 162* 166* 170*   Lipid Profile: No results for input(s): CHOL, HDL, LDLCALC, TRIG, CHOLHDL, LDLDIRECT in the last 72 hours. Thyroid Function Tests: No results for input(s): TSH, T4TOTAL, FREET4, T3FREE, THYROIDAB in the last 72 hours. Anemia Panel: No results for input(s): VITAMINB12, FOLATE, FERRITIN, TIBC, IRON, RETICCTPCT in the last 72 hours.    Radiology Studies: I have reviewed all of the imaging during this hospital visit personally     Scheduled Meds: . amiodarone  200 mg Oral Daily  . amLODipine  10 mg Oral Daily  . apixaban  2.5 mg Oral BID  . atorvastatin  20 mg Oral q1800  . feeding supplement (ENSURE ENLIVE)  237 mL Oral TID BM  . insulin aspart  0-9 Units Subcutaneous TID WC  . nystatin  5 mL Oral QID  . pantoprazole  40 mg Oral BID  . polyethylene glycol  17 g Oral Daily  . potassium chloride  20 mEq Oral Once  . sucralfate  1 g Oral TID WC & HS  . tamsulosin  0.4 mg Oral QPC breakfast   Continuous Infusions: . sodium chloride    . magnesium sulfate 1 - 4 g bolus IVPB       LOS: 18  days        Tawni Millers, MD [facility] Pager [phone]

## 2016-10-23 NOTE — Discharge Instructions (Signed)

## 2016-10-23 NOTE — Progress Notes (Signed)
CRITICAL VALUE ALERT  Critical Value:  Potassium at 2.4  Date & Time Notied:  10/23/2016  0551  Provider Notified: T. Opyd, MD  Orders Received/Actions taken: Awaiting

## 2016-10-23 NOTE — Accreditation Note (Signed)
Offered pt a drink at this time. Pt took some sips of water.

## 2016-10-24 LAB — BASIC METABOLIC PANEL
ANION GAP: 11 (ref 5–15)
BUN: 20 mg/dL (ref 6–20)
CHLORIDE: 108 mmol/L (ref 101–111)
CO2: 29 mmol/L (ref 22–32)
Calcium: 8.3 mg/dL — ABNORMAL LOW (ref 8.9–10.3)
Creatinine, Ser: 1.24 mg/dL — ABNORMAL HIGH (ref 0.44–1.00)
GFR calc Af Amer: 44 mL/min — ABNORMAL LOW (ref >60)
GFR, EST NON AFRICAN AMERICAN: 38 mL/min — AB (ref >60)
GLUCOSE: 153 mg/dL — AB (ref 65–99)
POTASSIUM: 2.3 mmol/L — AB (ref 3.5–5.1)
SODIUM: 148 mmol/L — AB (ref 135–145)

## 2016-10-24 LAB — GLUCOSE, CAPILLARY
GLUCOSE-CAPILLARY: 232 mg/dL — AB (ref 65–99)
Glucose-Capillary: 149 mg/dL — ABNORMAL HIGH (ref 65–99)
Glucose-Capillary: 194 mg/dL — ABNORMAL HIGH (ref 65–99)
Glucose-Capillary: 176 mg/dL — ABNORMAL HIGH (ref 65–99)

## 2016-10-24 MED ORDER — POTASSIUM CHLORIDE CRYS ER 20 MEQ PO TBCR
40.0000 meq | EXTENDED_RELEASE_TABLET | ORAL | Status: AC
  Filled 2016-10-24: qty 2

## 2016-10-24 NOTE — Progress Notes (Signed)
Patient refused 1400 meds.  Nurse and MD Arrien talked with patient about taking meds.  Will attempt to give again later.  Will continue to monitor.

## 2016-10-24 NOTE — Progress Notes (Signed)
Patient refused 1700 insulin.  Attempted to give potassium from earlier today that was refused but still refusing.  Will continue to monitor.

## 2016-10-24 NOTE — Progress Notes (Signed)
PROGRESS NOTE    Sherry [name]  NLZ:[id number] DOB: 08-Aug-1927 DOA: 10/05/2016 PCP: [name], MD    Brief Narrative:  83 yo female withhistory ofhypertension, type 2 diabetes mellitus, chronic kidney disease, pulmonary embolism, dyslipidemia and suspected dementia. Patient had a recent hospitalization for encephalopathy due to severe hypoglycemia. She was transferred from the nursing home this time back to the hospital due to altered mentation. She was not able to give any detailed history on admission. On initial physical examination blood pressure 173/74, heart rate 89, respiratory rate 17, oxygen saturation 100%. Dry mucous membranes, decreased breath sounds bilaterally with positive rales, heart S1-S2 present rhythmic, the abdomen had tenderness in the mid epigastrium, no guarding or rebound, no lower extremity edema. Sodium 140, potassium 3.3, chloride 105, bicarbonate 12, glucose 294, BUN 11, creatinine 1.38, white count 3.6, hemoglobin 10.9, hematocrit 34.3, platelets 394, , urinalysis with 6-30 white cells, 100 protein, specific gravity 1.020, urine streptococcal pneumonia antigen positive. Head CT with diffuse cortical atrophy. Chest x-ray with right basal opacity. EKG sinus rhythm, left axis deviation, left bundle-branch block, T-wave inversions in lateral leads, poor R-wave progression. Left ventricular hypertrophy.  Patient admitted to the hospital working diagnosis of healthcare associated pneumonia complicated by metabolic encephalopathy.  Patient with prolonged hospital stay, she has been refusing her medications, palliative care consulted and pending placement.    Assessment & Plan:   Principal Problem:   Pneumonia Active Problems:   Chronic kidney disease (CKD), stage III (moderate) (HCC)   Essential hypertension   Nausea & vomiting   1. Metabolic encephalopathy in the setting of dementia. Patient continue to refuse to eat or take medications. Palliative re  consulted and will consult ethics team. No active psych condition per consultation with psych.   2. Generalized deconditioning, severe malnutrition, calorie-protein. On nutritional supplements.   3. Chronic kidney disease stage III with right renal mass and hypokalemia. Patient refusing to take K supplements or IV fluids. Poor oral intake.   4. Atrial fibrillation/ paroxysmal. Today took amiodarone and apixaban, heart rate controlled.   5. Type 2 diabetes mellitus. Continue to monitor capillary glucose, 271, 217, 181, 149, 194. Accepting occasionally sq insulin.   6. Hypertension. Patient took amlodipine, today. Continue blood pressure monitoring   7. History of pulmonary embolism. Took apixaban today, but not consistent medication use.   8. Healthcare associated pneumonia and urinary tract infection.Off antibiotic therapy. Resolved.  Patient waiting for placement.    DVT prophylaxis:apixaban Code Status:dnr Family Communication: Disposition Plan:   Consultants:     Antimicrobials:       Subjective: Patient still refusing medications and meals, not able to contact any family, palliative care has been re consulted. Patient is refusing to talk or communicate.   Objective: Vitals:   10/23/16 1322 10/23/16 2041 10/24/16 0420 10/24/16 1425  BP: 140/60 (!) 125/43 139/65 (!) 116/49  Pulse: 77 74 87 71  Resp: 15  17 18   Temp: 97.8 F (36.6 C) 98 F (36.7 C) 97.8 F (36.6 C) 98.5 F (36.9 C)  TempSrc: Oral Oral Oral Oral  SpO2: 97% 100% 100% 100%  Weight:      Height:        Intake/Output Summary (Last 24 hours) at 10/24/16 1613 Last data filed at 10/24/16 1300  Gross per 24 hour  Intake              210 ml  Output  0 ml  Net              210 ml   Filed Weights   10/05/16 2049  Weight: 48.7 kg (107 lb 4.8 oz)    Examination:   General: Not in pain or dyspnea, deconditioned Neurology: Awake and alert, non focal  E  ENT: mild pallor, no icterus, oral mucosa moist Cardiovascular: No JVD. S1-S2 present, rhythmic, no gallops, rubs, or murmurs. Trace lower extremity edema. Pulmonary: vesicular breath sounds bilaterally, adequate air movement, no wheezing, rhonchi or rales. Decreased breath sounds at bases.  Gastrointestinal. Abdomen flat, no organomegaly, non tender, no rebound or guarding Skin. No rashes Musculoskeletal: no joint deformities     Data Reviewed: I have personally reviewed following labs and imaging studies  CBC: No results for input(s): WBC, NEUTROABS, HGB, HCT, MCV, PLT in the last 168 hours. Basic Metabolic Panel:  Recent Labs Lab 10/18/16 1304 10/19/16 0926 10/22/16 0422 10/23/16 0428 10/24/16 0641  NA 149* 149* 150* 147* 148*  K 4.1 4.1 2.3* 2.4* 2.3*  CL 112* 115* 111 107 108  CO2 25 23 27 26 29   GLUCOSE 243* 262* 138* 168* 153*  BUN 16 19 19  21* 20  CREATININE 1.17* 1.20* 1.36* 1.36* 1.24*  CALCIUM 8.0* 8.0* 8.0* 8.0* 8.3*   GFR: Estimated Creatinine Clearance: 24.6 mL/min (A) (by C-G formula based on SCr of 1.24 mg/dL (H)). Liver Function Tests: No results for input(s): AST, ALT, ALKPHOS, BILITOT, PROT, ALBUMIN in the last 168 hours. No results for input(s): LIPASE, AMYLASE in the last 168 hours. No results for input(s): AMMONIA in the last 168 hours. Coagulation Profile: No results for input(s): INR, PROTIME in the last 168 hours. Cardiac Enzymes: No results for input(s): CKTOTAL, CKMB, CKMBINDEX, TROPONINI in the last 168 hours. BNP (last 3 results) No results for input(s): PROBNP in the last 8760 hours. HbA1C: No results for input(s): HGBA1C in the last 72 hours. CBG:  Recent Labs Lab 10/23/16 1207 10/23/16 1833 10/23/16 2038 10/24/16 0736 10/24/16 1207  GLUCAP 271* 217* 181* 149* 194*   Lipid Profile: No results for input(s): CHOL, HDL, LDLCALC, TRIG, CHOLHDL, LDLDIRECT in the last 72 hours. Thyroid Function Tests: No results for input(s): TSH,  T4TOTAL, FREET4, T3FREE, THYROIDAB in the last 72 hours. Anemia Panel: No results for input(s): VITAMINB12, FOLATE, FERRITIN, TIBC, IRON, RETICCTPCT in the last 72 hours.    Radiology Studies: I have reviewed all of the imaging during this hospital visit personally     Scheduled Meds: . amiodarone  200 mg Oral Daily  . amLODipine  10 mg Oral Daily  . apixaban  2.5 mg Oral BID  . feeding supplement (ENSURE ENLIVE)  237 mL Oral TID BM  . insulin aspart  0-9 Units Subcutaneous TID WC  . potassium chloride  40 mEq Oral Q4H   Continuous Infusions:   LOS: 19 days        Aiyla Baucom Gerome Apley, MD [facility] Pager [phone]

## 2016-10-24 NOTE — Progress Notes (Signed)
CRITICAL VALUE ALERT  Critical Value:  Potassium 2.3  Date & Time Notied: 10/24/2016 at 0900  Provider Notified: Arrien  Orders Received/Actions taken: Potassium Chloride SA 40 meq q PO 4 hours

## 2016-10-24 NOTE — Progress Notes (Signed)
Patient refuses to take ordered Potassium Chloride SA 29meq.  MD Arrien notified.  Will continue to monitor.

## 2016-10-25 LAB — GLUCOSE, CAPILLARY
GLUCOSE-CAPILLARY: 207 mg/dL — AB (ref 65–99)
GLUCOSE-CAPILLARY: 136 mg/dL — AB (ref 65–99)
Glucose-Capillary: 223 mg/dL — ABNORMAL HIGH (ref 65–99)
Glucose-Capillary: 265 mg/dL — ABNORMAL HIGH (ref 65–99)

## 2016-10-25 NOTE — Progress Notes (Addendum)
CSW following to facilitate discharge to SNF. CSW contacted [facility] to review referral and discuss bed availability; [facility] does not have beds at this time to accept patient.  CSW sent referral to [facility] facilities: [facility], [facility], [place]. CSW called [facility] admissions to discuss referral and situation, and facility will call back after reviewing clinical information and discuss if there's any bed availability at this time.  CSW will follow up on contacting other facilities and will update team with information when available.  Laveda Abbe, [place] Clinical Social Worker [phone]   UPDATE 1:51 PM:  CSW contacted [facility] and [facility] to discuss referral and determine any bed availability. [facility] was left a voicemail. [facility] indicated that they would likely not be willing to accept a patient that does not have a guardian; Admissions coordinator explained that they aren't even willing to do that for the hospitals within their county, and that the hospitals local to that facility don't discharge patients until full guardianship has been established.  CSW will continue to attempt to find a facility that would accept the patient and will update with information when available.  Laveda Abbe, [place] Clinical Social Worker [phone]

## 2016-10-25 NOTE — Progress Notes (Signed)
Patient refused 0800 insulin and all 1000 meds.  Educated patient on risks of further deterioration without treatment, continues to refuse meds.  Will continue to monitor.

## 2016-10-25 NOTE — Progress Notes (Signed)
PROGRESS NOTE    Sherry [name]  TIW:[id number] DOB: Dec 12, 1927 DOA: 10/05/2016 PCP: [name], MD    Brief Narrative:  83 yo female withhistory ofhypertension, type 2 diabetes mellitus, chronic kidney disease, pulmonary embolism, dyslipidemia and suspected dementia. Patient had a recent hospitalization for encephalopathy due to severe hypoglycemia. She was transferred from the nursing home this time back to the hospital due to altered mentation. She was not able to give any detailed history on admission. On initial physical examination blood pressure 173/74, heart rate 89, respiratory rate 17, oxygen saturation 100%. Dry mucous membranes, decreased breath sounds bilaterally with positive rales, heart S1-S2 present rhythmic, the abdomen had tenderness in the mid epigastrium, no guarding or rebound, no lower extremity edema. Sodium 140, potassium 3.3, chloride 105, bicarbonate 12, glucose 294, BUN 11, creatinine 1.38, white count 3.6, hemoglobin 10.9, hematocrit 34.3, platelets 394, , urinalysis with 6-30 white cells, 100 protein, specific gravity 1.020, urine streptococcal pneumonia antigen positive. Head CT with diffuse cortical atrophy. Chest x-ray with right basal opacity. EKG sinus rhythm, left axis deviation, left bundle-branch block, T-wave inversions in lateral leads, poor R-wave progression. Left ventricular hypertrophy.  Patient admitted to the hospital working diagnosis of healthcare associated pneumonia complicated by metabolic encephalopathy.  Patient with prolonged hospital stay, she has been refusing her medications, palliative care consulted and pending placement.    Assessment & Plan:   Principal Problem:   Pneumonia Active Problems:   Chronic kidney disease (CKD), stage III (moderate) (HCC)   Essential hypertension   Nausea & vomiting   1. Metabolic encephalopathy in the setting of dementia. Patient continue to refuse to eat or take medications. Palliative re  consulted and will consult ethics team. No active psych condition per consultation with psych. Currently looking for residential hospice per clinical social worker notes.  2. Generalized deconditioning, severe malnutrition, calorie-protein. On nutritional supplements.   3. Chronic kidney disease stage III with right renal mass and hypokalemia. Patient refusing to take K supplements or IV fluids. Poor oral intake.   4. Atrial fibrillation/ paroxysmal. Today took amiodarone and apixaban, heart rate controlled.   5. Type 2 diabetes mellitus. Continue to monitor capillary glucose, 271, 217, 181, 149, 194. Accepting occasionally sq insulin.   6. Hypertension. Stable currently  7. History of pulmonary embolism. Continue apixaban  8. Healthcare associated pneumonia and urinary tract infection.Off antibiotic therapy. Resolved.  Patient waiting for placement.    DVT prophylaxis:apixaban Code Status:dnr Family Communication: Disposition Plan:pending residential hospice placement   Consultants:     Antimicrobials:       Subjective: Patient still refusing medications and meals, awaiting transition to residential hospice  Objective: Vitals:   10/24/16 1425 10/24/16 2130 10/25/16 0432 10/25/16 1545  BP: (!) 116/49 (!) 122/47 (!) 121/40 (!) 126/52  Pulse: 71 83 74 88  Resp: 18 18 18 18   Temp: 98.5 F (36.9 C) 98.7 F (37.1 C) 98.1 F (36.7 C) 98.4 F (36.9 C)  TempSrc: Oral Oral Oral Oral  SpO2: 100% 99% 98% 100%  Weight:      Height:        Intake/Output Summary (Last 24 hours) at 10/25/16 1843 Last data filed at 10/25/16 1556  Gross per 24 hour  Intake               10 ml  Output              200 ml  Net             -  190 ml   Filed Weights   10/05/16 2049  Weight: 48.7 kg (107 lb 4.8 oz)    Examination:   General: in nad., deconditioned Neurology: Awake and alert, non focal  E ENT: mild pallor, no icterus, oral mucosa  moist Cardiovascular: No JVD. S1-S2 present, rhythmic, no gallops, rubs, or murmurs. Trace lower extremity edema. Pulmonary: vesicular breath sounds bilaterally, adequate air movement, no wheezing, rhonchi or rales. Decreased breath sounds at bases.  Gastrointestinal. Abdomen flat, no organomegaly, non tender, no rebound  Skin. No rashes on limited exam. Musculoskeletal: no joint deformities     Data Reviewed: I have personally reviewed following labs and imaging studies  CBC: No results for input(s): WBC, NEUTROABS, HGB, HCT, MCV, PLT in the last 168 hours. Basic Metabolic Panel:  Recent Labs Lab 10/19/16 0926 10/22/16 0422 10/23/16 0428 10/24/16 0641  NA 149* 150* 147* 148*  K 4.1 2.3* 2.4* 2.3*  CL 115* 111 107 108  CO2 23 27 26 29   GLUCOSE 262* 138* 168* 153*  BUN 19 19 21* 20  CREATININE 1.20* 1.36* 1.36* 1.24*  CALCIUM 8.0* 8.0* 8.0* 8.3*   GFR: Estimated Creatinine Clearance: 24.6 mL/min (A) (by C-G formula based on SCr of 1.24 mg/dL (H)). Liver Function Tests: No results for input(s): AST, ALT, ALKPHOS, BILITOT, PROT, ALBUMIN in the last 168 hours. No results for input(s): LIPASE, AMYLASE in the last 168 hours. No results for input(s): AMMONIA in the last 168 hours. Coagulation Profile: No results for input(s): INR, PROTIME in the last 168 hours. Cardiac Enzymes: No results for input(s): CKTOTAL, CKMB, CKMBINDEX, TROPONINI in the last 168 hours. BNP (last 3 results) No results for input(s): PROBNP in the last 8760 hours. HbA1C: No results for input(s): HGBA1C in the last 72 hours. CBG:  Recent Labs Lab 10/24/16 1655 10/24/16 2129 10/25/16 0818 10/25/16 1319 10/25/16 1729  GLUCAP 176* 232* 207* 223* 265*   Lipid Profile: No results for input(s): CHOL, HDL, LDLCALC, TRIG, CHOLHDL, LDLDIRECT in the last 72 hours. Thyroid Function Tests: No results for input(s): TSH, T4TOTAL, FREET4, T3FREE, THYROIDAB in the last 72 hours. Anemia Panel: No results for  input(s): VITAMINB12, FOLATE, FERRITIN, TIBC, IRON, RETICCTPCT in the last 72 hours.    Radiology Studies: I have reviewed all of the imaging during this hospital visit personally  Scheduled Meds: . amiodarone  200 mg Oral Daily  . amLODipine  10 mg Oral Daily  . apixaban  2.5 mg Oral BID  . feeding supplement (ENSURE ENLIVE)  237 mL Oral TID BM  . insulin aspart  0-9 Units Subcutaneous TID WC   Continuous Infusions:   LOS: 20 days   Velvet Bathe, MD [facility] Pager [phone]

## 2016-10-26 DIAGNOSIS — E109 Type 1 diabetes mellitus without complications: Secondary | ICD-10-CM

## 2016-10-26 LAB — GLUCOSE, CAPILLARY
GLUCOSE-CAPILLARY: 116 mg/dL — AB (ref 65–99)
GLUCOSE-CAPILLARY: 132 mg/dL — AB (ref 65–99)
GLUCOSE-CAPILLARY: 68 mg/dL (ref 65–99)
Glucose-Capillary: 157 mg/dL — ABNORMAL HIGH (ref 65–99)
Glucose-Capillary: 145 mg/dL — ABNORMAL HIGH (ref 65–99)
Glucose-Capillary: 125 mg/dL — ABNORMAL HIGH (ref 65–99)

## 2016-10-26 NOTE — Progress Notes (Signed)
PROGRESS NOTE    Sherry [name]  FTD:[id number] DOB: 06/14/1927 DOA: 10/05/2016 PCP: [name], MD    Brief Narrative:  83 yo female withhistory ofhypertension, type 2 diabetes mellitus, chronic kidney disease, pulmonary embolism, dyslipidemia and suspected dementia. Patient had a recent hospitalization for encephalopathy due to severe hypoglycemia. She was transferred from the nursing home this time back to the hospital due to altered mentation. She was not able to give any detailed history on admission. On initial physical examination blood pressure 173/74, heart rate 89, respiratory rate 17, oxygen saturation 100%. Dry mucous membranes, decreased breath sounds bilaterally with positive rales, heart S1-S2 present rhythmic, the abdomen had tenderness in the mid epigastrium, no guarding or rebound, no lower extremity edema. Sodium 140, potassium 3.3, chloride 105, bicarbonate 12, glucose 294, BUN 11, creatinine 1.38, white count 3.6, hemoglobin 10.9, hematocrit 34.3, platelets 394, , urinalysis with 6-30 white cells, 100 protein, specific gravity 1.020, urine streptococcal pneumonia antigen positive. Head CT with diffuse cortical atrophy. Chest x-ray with right basal opacity. EKG sinus rhythm, left axis deviation, left bundle-branch block, T-wave inversions in lateral leads, poor R-wave progression. Left ventricular hypertrophy.  Patient admitted to the hospital working diagnosis of healthcare associated pneumonia complicated by metabolic encephalopathy.  Patient with prolonged hospital stay, she has been refusing her medications, palliative care consulted and pending placement.    Assessment & Plan:   Principal Problem:   Pneumonia Active Problems:   Chronic kidney disease (CKD), stage III (moderate) (HCC)   Essential hypertension   Nausea & vomiting   1.Metabolic encephalopathy in the setting of dementia. Patient awake and responsive, but still refusing to take her medications,  will hold on amiodarone for now to simplify her medical regimen, heart rate is controlled.   2. Generalized deconditioning, severe malnutrition, calorie-protein. Continue to encourage po intake and continue nutritional supplements.   3. Chronic kidney disease stage III with right renal mass and hypokalemia. Will check bmp in am.  4. Atrial fibrillation/ paroxysmal. Continue to refuse amiodarone, rate is controlled, will hold on antiarrhythmic agent for now to simplify her medical regimen.  5. Type 2 diabetes mellitus. Capillary glucose, 223, 265, 136, 157, 116.  6. Hypertension. Blood pressure systolic 062 to 376, patient not taking amlodipine. Will continue to hold on antihypertensive agents.    7. History of pulmonary embolism.  Refusing apixaban, with no specific reason when patient questioned why refuses her medications.  8. Healthcare associated pneumonia and urinary tract infection. Resolved.  Patient continue waiting for placement.    DVT prophylaxis:apixaban Code Status:dnr Family Communication: Disposition Plan:   Consultants:     Antimicrobials:     Subjective: Patient denies and pain or dyspnea, asking to go home. Reports having breakfast, but still refusing oral medications or IV meds.   Objective: Vitals:   10/25/16 0432 10/25/16 1545 10/25/16 2142 10/26/16 0444  BP: (!) 121/40 (!) 126/52 (!) 120/46 (!) 136/46  Pulse: 74 88 82 78  Resp: 18 18 18 18   Temp: 98.1 F (36.7 C) 98.4 F (36.9 C) 98.6 F (37 C) 97.9 F (36.6 C)  TempSrc: Oral Oral Oral Oral  SpO2: 98% 100% 100% 99%  Weight:      Height:        Intake/Output Summary (Last 24 hours) at 10/26/16 1400 Last data filed at 10/26/16 1047  Gross per 24 hour  Intake              130 ml  Output  300 ml  Net             -170 ml   Filed Weights   10/05/16 2049  Weight: 48.7 kg (107 lb 4.8 oz)    Examination:   General: Not in pain or dyspnea,  deconditioned Neurology: Awake and alert, non focal  E PFX:TKWI pallor, no icterus, oral mucosa moist Cardiovascular: No JVD. S1-S2 present, rhythmic, no gallops, rubs, or murmurs. No lower extremity edema. Pulmonary: vesicular breath sounds bilaterally, adequate air movement, no wheezing, rhonchi or rales. Gastrointestinal. Abdomen flat, no organomegaly, non tender, no rebound or guarding Skin. No rashes Musculoskeletal: no joint deformities     Data Reviewed: I have personally reviewed following labs and imaging studies  CBC: No results for input(s): WBC, NEUTROABS, HGB, HCT, MCV, PLT in the last 168 hours. Basic Metabolic Panel:  Recent Labs Lab 10/22/16 0422 10/23/16 0428 10/24/16 0641  NA 150* 147* 148*  K 2.3* 2.4* 2.3*  CL 111 107 108  CO2 27 26 29   GLUCOSE 138* 168* 153*  BUN 19 21* 20  CREATININE 1.36* 1.36* 1.24*  CALCIUM 8.0* 8.0* 8.3*   GFR: Estimated Creatinine Clearance: 24.6 mL/min (A) (by C-G formula based on SCr of 1.24 mg/dL (H)). Liver Function Tests: No results for input(s): AST, ALT, ALKPHOS, BILITOT, PROT, ALBUMIN in the last 168 hours. No results for input(s): LIPASE, AMYLASE in the last 168 hours. No results for input(s): AMMONIA in the last 168 hours. Coagulation Profile: No results for input(s): INR, PROTIME in the last 168 hours. Cardiac Enzymes: No results for input(s): CKTOTAL, CKMB, CKMBINDEX, TROPONINI in the last 168 hours. BNP (last 3 results) No results for input(s): PROBNP in the last 8760 hours. HbA1C: No results for input(s): HGBA1C in the last 72 hours. CBG:  Recent Labs Lab 10/25/16 0818 10/25/16 1319 10/25/16 1729 10/25/16 2140 10/26/16 0737  GLUCAP 207* 223* 265* 136* 157*   Lipid Profile: No results for input(s): CHOL, HDL, LDLCALC, TRIG, CHOLHDL, LDLDIRECT in the last 72 hours. Thyroid Function Tests: No results for input(s): TSH, T4TOTAL, FREET4, T3FREE, THYROIDAB in the last 72 hours. Anemia Panel: No results  for input(s): VITAMINB12, FOLATE, FERRITIN, TIBC, IRON, RETICCTPCT in the last 72 hours.    Radiology Studies: I have reviewed all of the imaging during this hospital visit personally     Scheduled Meds: . amiodarone  200 mg Oral Daily  . amLODipine  10 mg Oral Daily  . apixaban  2.5 mg Oral BID  . feeding supplement (ENSURE ENLIVE)  237 mL Oral TID BM  . insulin aspart  0-9 Units Subcutaneous TID WC   Continuous Infusions:   LOS: 21 days        Mauricio Gerome Apley, MD [facility] Pager [phone]

## 2016-10-27 LAB — GLUCOSE, CAPILLARY
GLUCOSE-CAPILLARY: 178 mg/dL — AB (ref 65–99)
Glucose-Capillary: 179 mg/dL — ABNORMAL HIGH (ref 65–99)
Glucose-Capillary: 202 mg/dL — ABNORMAL HIGH (ref 65–99)
Glucose-Capillary: 119 mg/dL — ABNORMAL HIGH (ref 65–99)

## 2016-10-27 LAB — BASIC METABOLIC PANEL
Anion gap: 11 (ref 5–15)
BUN: 17 mg/dL (ref 6–20)
CALCIUM: 8.1 mg/dL — AB (ref 8.9–10.3)
CO2: 28 mmol/L (ref 22–32)
CREATININE: 1.26 mg/dL — AB (ref 0.44–1.00)
Chloride: 108 mmol/L (ref 101–111)
GFR calc Af Amer: 43 mL/min — ABNORMAL LOW (ref >60)
GFR, EST NON AFRICAN AMERICAN: 37 mL/min — AB (ref >60)
GLUCOSE: 152 mg/dL — AB (ref 65–99)
POTASSIUM: 2.8 mmol/L — AB (ref 3.5–5.1)
SODIUM: 147 mmol/L — AB (ref 135–145)

## 2016-10-27 MED ORDER — POTASSIUM CHLORIDE CRYS ER 20 MEQ PO TBCR
40.0000 meq | EXTENDED_RELEASE_TABLET | Freq: Once | ORAL | Status: DC
  Filled 2016-10-27: qty 2

## 2016-10-27 NOTE — Progress Notes (Addendum)
CSW following to facilitate discharge to SNF. CSW contacted [employer] today and left a voicemail to discuss the case. CSW never received call back, so called again to see about sending referral and discussing the patient's case. CSW received fax number from receptionist, and sent referral to facility with a request for a call back to discuss.  CSW will continue to follow.   Laveda Abbe, [place] Clinical Social Worker [phone]

## 2016-10-27 NOTE — Progress Notes (Signed)
PROGRESS NOTE    Sherry [name]  [name]:[id number] DOB: 1927-05-21 DOA: 10/05/2016 PCP: Sherry Richards, MD    Brief Narrative:  83 yo female withhistory ofhypertension, type 2 diabetes mellitus, chronic kidney disease, pulmonary embolism, dyslipidemia and suspected dementia. Patient had a recent hospitalization for encephalopathy due to severe hypoglycemia. She was transferred from the nursing home this time back to the hospital due to altered mentation. She was not able to give any detailed history on admission. On initial physical examination blood pressure 173/74, heart rate 89, respiratory rate 17, oxygen saturation 100%. Dry mucous membranes, decreased breath sounds bilaterally with positive rales, heart S1-S2 present rhythmic, the abdomen had tenderness in the mid epigastrium, no guarding or rebound, no lower extremity edema. Sodium 140, potassium 3.3, chloride 105, bicarbonate 12, glucose 294, BUN 11, creatinine 1.38, white count 3.6, hemoglobin 10.9, hematocrit 34.3, platelets 394, , urinalysis with 6-30 white cells, 100 protein, specific gravity 1.020, urine streptococcal pneumonia antigen positive. Head CT with diffuse cortical atrophy. Chest x-ray with right basal opacity. EKG sinus rhythm, left axis deviation, left bundle-branch block, T-wave inversions in lateral leads, poor R-wave progression. Left ventricular hypertrophy.  Patient admitted to the hospital working diagnosis of healthcare associated pneumonia complicated by metabolic encephalopathy.  Patient with prolonged hospital stay, she has been refusing her medications, palliative care consulted and pending placement.   Assessment & Plan:   Principal Problem:   Pneumonia Active Problems:   Chronic kidney disease (CKD), stage III (moderate) (HCC)   Essential hypertension   Nausea & vomiting  1.Metabolic encephalopathy in the setting of dementia. Patient awake and responsive, continue refusing to take her medications.   2.  Generalized deconditioning, severe malnutrition, calorie-protein. Encouraging po intake.  3. Chronic kidney disease stage III with right renal mass and hypokalemia. Persistent hypokalemia, refusing KCl supplementation, will continue close monitoring.   4. Atrial fibrillation/ paroxysmal. Not taking any medications. Will continue close monitoring of HR.  5. Type 2 diabetes mellitus. Very poor oral intake. Capillary glucose, 178, 179, 119.  6. Hypertension. Off anti-hypertensive agents.    7. History of pulmonary embolism. on apixaban.  8. Healthcare associated pneumonia and urinary tract infection. Resolved.  Patient continue waiting for placement  DVT prophylaxis: scd  Code Status: dnr Family Communication: no family Disposition Plan: snf   Consultants:   Psychiatry  Palliative care  Procedures:     Antimicrobials:      Subjective: Patient still refusing to eat or take medications, not in pain or dyspnea, out of bed to chair.  Objective: Vitals:   10/26/16 1443 10/26/16 2120 10/27/16 0553 10/27/16 1410  BP: (!) 117/54 (!) 117/47 (!) 149/57 (!) 133/49  Pulse: 74 95 77 74  Resp: 18 18 18 18   Temp: 98.3 F (36.8 C) 98.4 F (36.9 C) 98 F (36.7 C) 98.7 F (37.1 C)  TempSrc: Oral Oral Oral Oral  SpO2: 100% 100% 99% 99%  Weight:      Height:        Intake/Output Summary (Last 24 hours) at 10/27/16 1659 Last data filed at 10/27/16 1500  Gross per 24 hour  Intake              660 ml  Output              475 ml  Net              185 ml   Filed Weights   10/05/16 2049  Weight: 48.7 kg (107 lb  4.8 oz)    Examination:   General: Not in pain or dyspnea, deconditioned Neurology: Awake and alert, non focal  E ENT: mid pallor, no icterus, oral mucosa moist Cardiovascular: No JVD. S1-S2 present, rhythmic, no gallops, rubs, or murmurs. Trace lower extremity edema. Pulmonary: vesicular breath sounds bilaterally, adequate air movement, no wheezing,  rhonchi or rales. Gastrointestinal. Abdomen flat, no organomegaly, non tender, no rebound or guarding Skin. No rashes Musculoskeletal: no joint deformities     Data Reviewed: I have personally reviewed following labs and imaging studies  CBC: No results for input(s): WBC, NEUTROABS, HGB, HCT, MCV, PLT in the last 168 hours. Basic Metabolic Panel:  Recent Labs Lab 10/22/16 0422 10/23/16 0428 10/24/16 0641 10/27/16 0545  NA 150* 147* 148* 147*  K 2.3* 2.4* 2.3* 2.8*  CL 111 107 108 108  CO2 27 26 29 28   GLUCOSE 138* 168* 153* 152*  BUN 19 21* 20 17  CREATININE 1.36* 1.36* 1.24* 1.26*  CALCIUM 8.0* 8.0* 8.3* 8.1*   GFR: Estimated Creatinine Clearance: 24.2 mL/min (A) (by C-G formula based on SCr of 1.26 mg/dL (H)). Liver Function Tests: No results for input(s): AST, ALT, ALKPHOS, BILITOT, PROT, ALBUMIN in the last 168 hours. No results for input(s): LIPASE, AMYLASE in the last 168 hours. No results for input(s): AMMONIA in the last 168 hours. Coagulation Profile: No results for input(s): INR, PROTIME in the last 168 hours. Cardiac Enzymes: No results for input(s): CKTOTAL, CKMB, CKMBINDEX, TROPONINI in the last 168 hours. BNP (last 3 results) No results for input(s): PROBNP in the last 8760 hours. HbA1C: No results for input(s): HGBA1C in the last 72 hours. CBG:  Recent Labs Lab 10/26/16 1716 10/26/16 2156 10/26/16 2304 10/27/16 0811 10/27/16 1254  GLUCAP 145* 68 125* 178* 179*   Lipid Profile: No results for input(s): CHOL, HDL, LDLCALC, TRIG, CHOLHDL, LDLDIRECT in the last 72 hours. Thyroid Function Tests: No results for input(s): TSH, T4TOTAL, FREET4, T3FREE, THYROIDAB in the last 72 hours. Anemia Panel: No results for input(s): VITAMINB12, FOLATE, FERRITIN, TIBC, IRON, RETICCTPCT in the last 72 hours.    Radiology Studies: I have reviewed all of the imaging during this hospital visit personally     Scheduled Meds: . apixaban  2.5 mg Oral BID  .  feeding supplement (ENSURE ENLIVE)  237 mL Oral TID BM  . insulin aspart  0-9 Units Subcutaneous TID WC   Continuous Infusions:   LOS: 22 days        Sherry Horace Gerome Apley, MD [facility] Pager [phone]

## 2016-10-27 NOTE — Progress Notes (Signed)
CSW following to facilitate discharge planning. CSW sent referral to [facility], and followed up on referral sent to [facility] [place]. CSW received information from [facility] that they could not offer a bed because they were fairly certain that transitioning a patient outside of the county would void the current APS custody case.   CSW updated CSW Surveyor, quantity, and discussed difficulty in bed placement. CSW Surveyor, quantity contacted [facility] to determine if patient could admit with CSW AD signing in, and patient can admit on Monday. CSW will follow to facilitate discharge when bed available.  Laveda Abbe, [place] Clinical Social Worker [phone]

## 2016-10-27 NOTE — Progress Notes (Deleted)
CSW checked with [facility] and [facility] on bed availability, per son's request. [facility] a private bed available and can take the patient. CSW contacted patient's son and left a voicemail, indicating that the patient could be transitioned to start her rehab as soon as the son returns the call.   CSW to continue to follow.  Laveda Abbe, [place] Clinical Social Worker [phone]

## 2016-10-28 DIAGNOSIS — J189 Pneumonia, unspecified organism: Principal | ICD-10-CM

## 2016-10-28 LAB — BASIC METABOLIC PANEL
ANION GAP: 9 (ref 5–15)
BUN: 17 mg/dL (ref 6–20)
CHLORIDE: 106 mmol/L (ref 101–111)
CO2: 32 mmol/L (ref 22–32)
CREATININE: 1.26 mg/dL — AB (ref 0.44–1.00)
Calcium: 8.1 mg/dL — ABNORMAL LOW (ref 8.9–10.3)
GFR calc non Af Amer: 37 mL/min — ABNORMAL LOW (ref >60)
GFR, EST AFRICAN AMERICAN: 43 mL/min — AB (ref >60)
Glucose, Bld: 147 mg/dL — ABNORMAL HIGH (ref 65–99)
Potassium: 2.2 mmol/L — CL (ref 3.5–5.1)
Sodium: 147 mmol/L — ABNORMAL HIGH (ref 135–145)

## 2016-10-28 LAB — GLUCOSE, CAPILLARY
GLUCOSE-CAPILLARY: 163 mg/dL — AB (ref 65–99)
GLUCOSE-CAPILLARY: 73 mg/dL (ref 65–99)
Glucose-Capillary: 142 mg/dL — ABNORMAL HIGH (ref 65–99)
Glucose-Capillary: 133 mg/dL — ABNORMAL HIGH (ref 65–99)

## 2016-10-28 LAB — MAGNESIUM: MAGNESIUM: 1.9 mg/dL (ref 1.7–2.4)

## 2016-10-28 MED ORDER — POTASSIUM CHLORIDE 20 MEQ/15ML (10%) PO SOLN
40.0000 meq | ORAL | Status: DC
  Administered 2016-10-28: 40 meq via ORAL
  Filled 2016-10-28 (×2): qty 30

## 2016-10-28 MED ORDER — POTASSIUM CHLORIDE 10 MEQ/100ML IV SOLN
10.0000 meq | INTRAVENOUS | Status: AC
  Administered 2016-10-28 – 2016-10-29 (×6): 10 meq via INTRAVENOUS
  Filled 2016-10-28 (×6): qty 100

## 2016-10-28 MED ORDER — POTASSIUM CHLORIDE 10 MEQ/100ML IV SOLN: 10.0000 meq | INTRAVENOUS | Status: DC

## 2016-10-28 MED ORDER — POTASSIUM CHLORIDE 20 MEQ/15ML (10%) PO SOLN
40.0000 meq | Freq: Three times a day (TID) | ORAL | Status: DC
  Administered 2016-10-28: 40 meq via ORAL
  Filled 2016-10-28: qty 30

## 2016-10-28 NOTE — Progress Notes (Signed)
CRITICAL VALUE ALERT  Critical Value: Potasium = 2.2  Date & Time Notied:  10/28/2016 0640  Provider Notified: Lamar Blinks NP  Orders Received/Actions taken: 0700

## 2016-10-28 NOTE — Progress Notes (Signed)
Pt refusing all the meds. Tried to offer it multiple times; educated on importance of the treatment, pt got frustrated and still refuses. Will continue to monitor.

## 2016-10-28 NOTE — Progress Notes (Signed)
PROGRESS NOTE    Sherry [name]  TKP:[id number] DOB: [birth year] DOA: 10/05/2016 PCP: [name], MD     Brief Narrative:  83 year old woman with prolonged hospitalization initially admitted from SNF due to altered mentation, found to have UTI and hospital-acquired pneumonia. She is currently pending placement that has been secured for Monday.   Assessment & Plan:   Principal Problem:   Pneumonia Active Problems:   Chronic kidney disease (CKD), stage III (moderate) (HCC)   Essential hypertension   Nausea & vomiting   1.Metabolic encephalopathy in the setting of dementia. Patient awake and responsive, continues refusing to take her medications.   2. Generalized deconditioning, severe malnutrition, calorie-protein. Encouraging po intake.  3. Chronic kidney disease stage III with right renal mass and hypokalemia. Persistent hypokalemia, refusing KCl supplementation, K is 2.2, will attempt IV replacement. Check magnesium level.  4. Atrial fibrillation/ paroxysmal. Not taking any medications. Will continue close monitoring of HR.currently is rate controlled.  5. Type 2 diabetes mellitus. Very poor oral intake. Fair control.Capillary glucose, 202, 119, 142, 163  6. Hypertension. Off anti-hypertensive agents.   7. History of pulmonary embolism.on apixaban.  8. Healthcare associated pneumonia and urinary tract infection.Resolved.  9. Severe protein caloric malnutrition. Noted, poor oral intake.     DVT prophylaxis: Eliquis Code Status: Full code Family Communication: Patient only Disposition Plan: SNF on Monday  Consultants:   Palliative Care  Procedures:   None  Antimicrobials:  Anti-infectives    Start     Dose/Rate Route Frequency Ordered Stop   10/08/16 1430  amoxicillin-clavulanate (AUGMENTIN) 500-125 MG per tablet 500 mg  Status:  Discontinued     1 tablet Oral 2 times daily 10/08/16 1419 10/13/16 1429   10/06/16 1800  vancomycin (VANCOCIN)  IVPB 750 mg/150 ml premix  Status:  Discontinued     750 mg 150 mL/hr over 60 Minutes Intravenous Every 24 hours 10/05/16 1705 10/06/16 1334   10/06/16 1800  vancomycin (VANCOCIN) 500 mg in sodium chloride 0.9 % 100 mL IVPB  Status:  Discontinued     500 mg 100 mL/hr over 60 Minutes Intravenous Every 24 hours 10/06/16 1334 10/06/16 1336   10/06/16 1800  vancomycin (VANCOCIN) IVPB 750 mg/150 ml premix  Status:  Discontinued     750 mg 150 mL/hr over 60 Minutes Intravenous Every 24 hours 10/06/16 1336 10/07/16 1028   10/06/16 1700  ceFEPIme (MAXIPIME) 1 g in dextrose 5 % 50 mL IVPB  Status:  Discontinued     1 g 100 mL/hr over 30 Minutes Intravenous Every 24 hours 10/05/16 1831 10/08/16 1419   10/05/16 2200  ceFEPIme (MAXIPIME) 1 g in dextrose 5 % 50 mL IVPB  Status:  Discontinued     1 g 100 mL/hr over 30 Minutes Intravenous Every 8 hours 10/05/16 2042 10/05/16 2102   10/05/16 1700  vancomycin (VANCOCIN) 1,500 mg in sodium chloride 0.9 % 500 mL IVPB     1,500 mg 250 mL/hr over 120 Minutes Intravenous  Once 10/05/16 1636 10/05/16 2016   10/05/16 1615  ceFEPIme (MAXIPIME) 1 g in dextrose 5 % 50 mL IVPB  Status:  Discontinued     1 g 100 mL/hr over 30 Minutes Intravenous Every 8 hours 10/05/16 1609 10/05/16 1831       Subjective: Lying in bed. Denies pain, refuses to take medications.  Objective: Vitals:   10/27/16 1410 10/27/16 2120 10/28/16 0516 10/28/16 1207  BP: (!) 133/49 (!) 136/52 (!) 140/49 (!) 135/55  Pulse: 74  69 75 70  Resp: 18 16 16 18   Temp: 98.7 F (37.1 C) 98.5 F (36.9 C) 98.7 F (37.1 C) 98.5 F (36.9 C)  TempSrc: Oral Oral  Oral  SpO2: 99% 98% 99%   Weight:      Height:        Intake/Output Summary (Last 24 hours) at 10/28/16 1406 Last data filed at 10/28/16 0840  Gross per 24 hour  Intake              710 ml  Output              700 ml  Net               10 ml   Filed Weights   10/05/16 2049  Weight: 48.7 kg (107 lb 4.8 oz)     Examination:  General exam: awake, deconditioned, cachectic Respiratory system: Clear to auscultation.  Cardiovascular system:RRR. No murmurs, rubs, gallops. Gastrointestinal system: Abdomen is nondistended, soft and nontender. No organomegaly or masses felt. Normal bowel sounds heard. Central nervous system: Alert and oriented. No focal neurological deficits. Extremities: No C/C/E, +pedal pulses Skin: No rashes, lesions or ulcers     Data Reviewed: I have personally reviewed following labs and imaging studies  CBC: No results for input(s): WBC, NEUTROABS, HGB, HCT, MCV, PLT in the last 168 hours. Basic Metabolic Panel:  Recent Labs Lab 10/22/16 0422 10/23/16 0428 10/24/16 0641 10/27/16 0545 10/28/16 0458  NA 150* 147* 148* 147* 147*  K 2.3* 2.4* 2.3* 2.8* 2.2*  CL 111 107 108 108 106  CO2 27 26 29 28  32  GLUCOSE 138* 168* 153* 152* 147*  BUN 19 21* 20 17 17   CREATININE 1.36* 1.36* 1.24* 1.26* 1.26*  CALCIUM 8.0* 8.0* 8.3* 8.1* 8.1*   GFR: Estimated Creatinine Clearance: 24.2 mL/min (A) (by C-G formula based on SCr of 1.26 mg/dL (H)). Liver Function Tests: No results for input(s): AST, ALT, ALKPHOS, BILITOT, PROT, ALBUMIN in the last 168 hours. No results for input(s): LIPASE, AMYLASE in the last 168 hours. No results for input(s): AMMONIA in the last 168 hours. Coagulation Profile: No results for input(s): INR, PROTIME in the last 168 hours. Cardiac Enzymes: No results for input(s): CKTOTAL, CKMB, CKMBINDEX, TROPONINI in the last 168 hours. BNP (last 3 results) No results for input(s): PROBNP in the last 8760 hours. HbA1C: No results for input(s): HGBA1C in the last 72 hours. CBG:  Recent Labs Lab 10/27/16 1254 10/27/16 1659 10/27/16 2124 10/28/16 0749 10/28/16 1204  GLUCAP 179* 202* 119* 142* 163*   Lipid Profile: No results for input(s): CHOL, HDL, LDLCALC, TRIG, CHOLHDL, LDLDIRECT in the last 72 hours. Thyroid Function Tests: No results for  input(s): TSH, T4TOTAL, FREET4, T3FREE, THYROIDAB in the last 72 hours. Anemia Panel: No results for input(s): VITAMINB12, FOLATE, FERRITIN, TIBC, IRON, RETICCTPCT in the last 72 hours. Urine analysis:    Component Value Date/Time   COLORURINE YELLOW 10/05/2016 1448   APPEARANCEUR HAZY (A) 10/05/2016 1448   APPEARANCEUR Clear 10/21/2012 1333   LABSPEC 1.020 10/05/2016 1448   LABSPEC 1.006 10/21/2012 1333   PHURINE 6.0 10/05/2016 1448   GLUCOSEU 100 (A) 10/05/2016 1448   GLUCOSEU 50 mg/dL 10/21/2012 1333   HGBUR MODERATE (A) 10/05/2016 1448   BILIRUBINUR NEGATIVE 10/05/2016 1448   BILIRUBINUR Negative 10/21/2012 1333   KETONESUR >80 (A) 10/05/2016 1448   PROTEINUR 100 (A) 10/05/2016 1448   NITRITE NEGATIVE 10/05/2016 1448   LEUKOCYTESUR SMALL (A) 10/05/2016 1448  LEUKOCYTESUR Negative 10/21/2012 1333   Sepsis Labs: @LABRCNTIP (procalcitonin:4,lacticidven:4)  )No results found for this or any previous visit (from the past 240 hour(s)).       Radiology Studies: No results found.      Scheduled Meds: . apixaban  2.5 mg Oral BID  . feeding supplement (ENSURE ENLIVE)  237 mL Oral TID BM  . insulin aspart  0-9 Units Subcutaneous TID WC  . potassium chloride  40 mEq Oral TID   Continuous Infusions:   LOS: 23 days    Time spent: 25 minutes. Greater than 50% of this time was spent in direct contact with the patient coordinating care.     Lelon Frohlich, MD [facility] Pager [phone]  If 7PM-7AM, please contact night-coverage [email or web address] Password [email or web address] 10/28/2016, 2:06 PM

## 2016-10-28 NOTE — Progress Notes (Signed)
Multiple attempts to give po Potassium but  Pt refused. Attempted to restart IV but unable. IV team notified to attempt again.

## 2016-10-29 LAB — GLUCOSE, CAPILLARY
GLUCOSE-CAPILLARY: 116 mg/dL — AB (ref 65–99)
GLUCOSE-CAPILLARY: 167 mg/dL — AB (ref 65–99)
GLUCOSE-CAPILLARY: 94 mg/dL (ref 65–99)
GLUCOSE-CAPILLARY: 84 mg/dL (ref 65–99)

## 2016-10-29 LAB — BASIC METABOLIC PANEL
ANION GAP: 9 (ref 5–15)
BUN: 17 mg/dL (ref 6–20)
CHLORIDE: 113 mmol/L — AB (ref 101–111)
CO2: 24 mmol/L (ref 22–32)
Calcium: 7.4 mg/dL — ABNORMAL LOW (ref 8.9–10.3)
Creatinine, Ser: 1.24 mg/dL — ABNORMAL HIGH (ref 0.44–1.00)
GFR calc Af Amer: 44 mL/min — ABNORMAL LOW (ref >60)
GFR, EST NON AFRICAN AMERICAN: 38 mL/min — AB (ref >60)
GLUCOSE: 84 mg/dL (ref 65–99)
POTASSIUM: 3.7 mmol/L (ref 3.5–5.1)
SODIUM: 146 mmol/L — AB (ref 135–145)

## 2016-10-29 NOTE — Progress Notes (Signed)
PROGRESS NOTE    Sherry [name]  OIN:[id number] DOB: Aug 31, 1927 DOA: 10/05/2016 PCP: [name], MD     Brief Narrative:  83 year old woman with prolonged hospitalization initially admitted from SNF due to altered mentation, found to have UTI and hospital-acquired pneumonia. She is currently pending placement that has been secured for Monday. Main issue over past 24 hours has been her hypokalemia which has now been adequately replaced.   Assessment & Plan:   Principal Problem:   Pneumonia Active Problems:   Chronic kidney disease (CKD), stage III (moderate) (HCC)   Essential hypertension   Nausea & vomiting   1.Metabolic encephalopathy in the setting of dementia. Patient awake and responsive when aroused but generally drowsy and sleepy, continues refusing to take her medications.   2. Generalized deconditioning, severe malnutrition, calorie-protein. Encouraging po intake.  3. Chronic kidney disease stage III with right renal mass and hypokalemia. Potassium was 2.2 on 10/20, this has improved with IV supplementation to 3.7 today. Magnesium is within normal limits at 1.9.  4. Atrial fibrillation/ paroxysmal. Not taking any medications. Will continue close monitoring of HR.currently is rate controlled.  5. Type 2 diabetes mellitus. Very poor oral intake. Fair control.Capillary glucose, 133, 73, 116, 167  6. Hypertension. Off anti-hypertensive agents.   7. History of pulmonary embolism.on apixaban.  8. Healthcare associated pneumonia and urinary tract infection.Resolved.  9. Severe protein caloric malnutrition. Noted, poor oral intake.     DVT prophylaxis: Eliquis Code Status: Full code Family Communication: Patient only Disposition Plan: SNF on Monday  Consultants:   Palliative Care  Procedures:   None  Antimicrobials:  Anti-infectives    Start     Dose/Rate Route Frequency Ordered Stop   10/08/16 1430  amoxicillin-clavulanate (AUGMENTIN)  500-125 MG per tablet 500 mg  Status:  Discontinued     1 tablet Oral 2 times daily 10/08/16 1419 10/13/16 1429   10/06/16 1800  vancomycin (VANCOCIN) IVPB 750 mg/150 ml premix  Status:  Discontinued     750 mg 150 mL/hr over 60 Minutes Intravenous Every 24 hours 10/05/16 1705 10/06/16 1334   10/06/16 1800  vancomycin (VANCOCIN) 500 mg in sodium chloride 0.9 % 100 mL IVPB  Status:  Discontinued     500 mg 100 mL/hr over 60 Minutes Intravenous Every 24 hours 10/06/16 1334 10/06/16 1336   10/06/16 1800  vancomycin (VANCOCIN) IVPB 750 mg/150 ml premix  Status:  Discontinued     750 mg 150 mL/hr over 60 Minutes Intravenous Every 24 hours 10/06/16 1336 10/07/16 1028   10/06/16 1700  ceFEPIme (MAXIPIME) 1 g in dextrose 5 % 50 mL IVPB  Status:  Discontinued     1 g 100 mL/hr over 30 Minutes Intravenous Every 24 hours 10/05/16 1831 10/08/16 1419   10/05/16 2200  ceFEPIme (MAXIPIME) 1 g in dextrose 5 % 50 mL IVPB  Status:  Discontinued     1 g 100 mL/hr over 30 Minutes Intravenous Every 8 hours 10/05/16 2042 10/05/16 2102   10/05/16 1700  vancomycin (VANCOCIN) 1,500 mg in sodium chloride 0.9 % 500 mL IVPB     1,500 mg 250 mL/hr over 120 Minutes Intravenous  Once 10/05/16 1636 10/05/16 2016   10/05/16 1615  ceFEPIme (MAXIPIME) 1 g in dextrose 5 % 50 mL IVPB  Status:  Discontinued     1 g 100 mL/hr over 30 Minutes Intravenous Every 8 hours 10/05/16 1609 10/05/16 1831       Subjective: In chair, sleeping, arouses to voice  but not very interactive, denies pain, continues to refuse to take medications.  Objective: Vitals:   10/28/16 1207 10/28/16 2155 10/29/16 0619 10/29/16 1446  BP: (!) 135/55 (!) 125/45 (!) 136/51 (!) 124/45  Pulse: 70 75 73 78  Resp: 18 16 15 16   Temp: 98.5 F (36.9 C) 98.7 F (37.1 C) 98.5 F (36.9 C) 98.1 F (36.7 C)  TempSrc: Oral Oral Oral Oral  SpO2:  100% 99% 100%  Weight:      Height:        Intake/Output Summary (Last 24 hours) at 10/29/16 1450 Last data  filed at 10/29/16 1100  Gross per 24 hour  Intake              690 ml  Output              300 ml  Net              390 ml   Filed Weights   10/05/16 2049  Weight: 48.7 kg (107 lb 4.8 oz)    Examination:  General exam: awake, deconditioned, cachectic Respiratory system: Clear to auscultation. Respiratory effort normal. Cardiovascular system:RRR. No murmurs, rubs, gallops. Gastrointestinal system: Abdomen is nondistended, soft and nontender. No organomegaly or masses felt. Normal bowel sounds heard. Central nervous system: Alert and oriented. No focal neurological deficits. Extremities: No C/C/E, +pedal pulses Skin: No rashes, lesions or ulcers     Data Reviewed: I have personally reviewed following labs and imaging studies  CBC: No results for input(s): WBC, NEUTROABS, HGB, HCT, MCV, PLT in the last 168 hours. Basic Metabolic Panel:  Recent Labs Lab 10/23/16 0428 10/24/16 0641 10/27/16 0545 10/28/16 0458 10/28/16 1411 10/29/16 0427  NA 147* 148* 147* 147*  --  146*  K 2.4* 2.3* 2.8* 2.2*  --  3.7  CL 107 108 108 106  --  113*  CO2 26 29 28  32  --  24  GLUCOSE 168* 153* 152* 147*  --  84  BUN 21* 20 17 17   --  17  CREATININE 1.36* 1.24* 1.26* 1.26*  --  1.24*  CALCIUM 8.0* 8.3* 8.1* 8.1*  --  7.4*  MG  --   --   --   --  1.9  --    GFR: Estimated Creatinine Clearance: 24.6 mL/min (A) (by C-G formula based on SCr of 1.24 mg/dL (H)). Liver Function Tests: No results for input(s): AST, ALT, ALKPHOS, BILITOT, PROT, ALBUMIN in the last 168 hours. No results for input(s): LIPASE, AMYLASE in the last 168 hours. No results for input(s): AMMONIA in the last 168 hours. Coagulation Profile: No results for input(s): INR, PROTIME in the last 168 hours. Cardiac Enzymes: No results for input(s): CKTOTAL, CKMB, CKMBINDEX, TROPONINI in the last 168 hours. BNP (last 3 results) No results for input(s): PROBNP in the last 8760 hours. HbA1C: No results for input(s): HGBA1C in  the last 72 hours. CBG:  Recent Labs Lab 10/28/16 1204 10/28/16 1617 10/28/16 2153 10/29/16 0801 10/29/16 1219  GLUCAP 163* 133* 73 116* 167*   Lipid Profile: No results for input(s): CHOL, HDL, LDLCALC, TRIG, CHOLHDL, LDLDIRECT in the last 72 hours. Thyroid Function Tests: No results for input(s): TSH, T4TOTAL, FREET4, T3FREE, THYROIDAB in the last 72 hours. Anemia Panel: No results for input(s): VITAMINB12, FOLATE, FERRITIN, TIBC, IRON, RETICCTPCT in the last 72 hours. Urine analysis:    Component Value Date/Time   COLORURINE YELLOW 10/05/2016 1448   APPEARANCEUR HAZY (A) 10/05/2016 1448  APPEARANCEUR Clear 10/21/2012 1333   LABSPEC 1.020 10/05/2016 1448   LABSPEC 1.006 10/21/2012 1333   PHURINE 6.0 10/05/2016 1448   GLUCOSEU 100 (A) 10/05/2016 1448   GLUCOSEU 50 mg/dL 10/21/2012 1333   HGBUR MODERATE (A) 10/05/2016 1448   BILIRUBINUR NEGATIVE 10/05/2016 1448   BILIRUBINUR Negative 10/21/2012 1333   KETONESUR >80 (A) 10/05/2016 1448   PROTEINUR 100 (A) 10/05/2016 1448   NITRITE NEGATIVE 10/05/2016 1448   LEUKOCYTESUR SMALL (A) 10/05/2016 1448   LEUKOCYTESUR Negative 10/21/2012 1333   Sepsis Labs: @LABRCNTIP (procalcitonin:4,lacticidven:4)  )No results found for this or any previous visit (from the past 240 hour(s)).       Radiology Studies: No results found.      Scheduled Meds: . apixaban  2.5 mg Oral BID  . feeding supplement (ENSURE ENLIVE)  237 mL Oral TID BM  . insulin aspart  0-9 Units Subcutaneous TID WC   Continuous Infusions:   LOS: 24 days    Time spent: 25 minutes. Greater than 50% of this time was spent in direct contact with the patient coordinating care.     Lelon Frohlich, MD [facility] Pager [phone]  If 7PM-7AM, please contact night-coverage [email or web address] Password [email or web address] 10/29/2016, 2:50 PM

## 2016-10-30 LAB — GLUCOSE, CAPILLARY
GLUCOSE-CAPILLARY: 114 mg/dL — AB (ref 65–99)
GLUCOSE-CAPILLARY: 141 mg/dL — AB (ref 65–99)
Glucose-Capillary: 139 mg/dL — ABNORMAL HIGH (ref 65–99)
Glucose-Capillary: 93 mg/dL (ref 65–99)

## 2016-10-30 MED ORDER — ENSURE ENLIVE PO LIQD: 237.0000 mL | Freq: Three times a day (TID) | ORAL | 12 refills | Status: DC

## 2016-10-30 NOTE — Progress Notes (Signed)
CSW following to facilitate discharge to SNF. CSW contacted [facility] to coordinate discharge; [facility] admissions indicated that they had never fully confirmed with CSW AD that they could take the patient. [facility] admissions, after discussing needs, indicated that they do not have any long term beds open for the patient at this time. CSW attempted to contact CSW AD to alert of continued difficulties in obtaining placement for the patient and left a voicemail.  CSW will continue to follow.  Laveda Abbe, [place] Clinical Social Worker [phone]

## 2016-10-30 NOTE — Discharge Summary (Signed)
Physician Discharge Summary  Sherry [name] KZL:[id number] DOB: 29-Dec-1927 DOA: 10/05/2016  PCP: [name], MD  Admit date: 10/05/2016 Discharge date: 10/30/2016  Time spent: 45 minutes  Recommendations for Outpatient Follow-up:  -Will be discharged to SNF today. -If patient continues to refuse meds and food, would benefit from palliative care following at SNF.   Discharge Diagnoses:  Principal Problem:   Pneumonia Active Problems:   Chronic kidney disease (CKD), stage III (moderate) (HCC)   Essential hypertension   Nausea & vomiting   Discharge Condition: Stable and improved  Filed Weights   10/05/16 2049  Weight: 48.7 kg (107 lb 4.8 oz)    History of present illness:  As per Dr. Starla Link on 9/27: Sherry [name] is a 83 y.o. female with medical history significant of hypertension, diabetes, chronic kidney disease, pulmonary embolism on oral anticoagulation, hyperlipidemia, questionable dementia and recent admission and discharge on 09/19/2016 at [facility] for acute encephalopathy due to severe hypoglycemia and hematemesis with possible upper GI bleed was sent from nursing home for altered mental status. Patient is a poor historian so reliability of the history is poor. Patient is unsure why she is in the hospital although she states that she is having abdominal pain and vomiting. She does not provide any other history. As per the ER provider Vanita Ingles dentition, patient was apparently evaluated in emergency department at the facility yesterday due to ongoing nausea and vomiting but was then draped return back to her nursing home after evaluation for cellulitis.  ED Course:  In the emergency room patient was given IV antibiotics for probable pneumonia and hospitalist service was called to evaluate the patient.  Hospital Course:   1.Metabolic encephalopathy in the setting of dementia. Patient awake and responsive when aroused but generally drowsy and sleepy,  continuesrefusing to take her medications.   2. Generalized deconditioning, severe malnutrition, calorie-protein. Encouragingpo intake. Continue megace. May benefit from palliative care assessment at SNF if continues to refuse POs.  3. Chronic kidney disease stage III with right renal mass and hypokalemia. Potassium was 2.2 on 10/20, this has improved with IV supplementation to 3.7 on 10/21.. Magnesium is within normal limits at 1.9.  4. Atrial fibrillation/ paroxysmal. Not taking any medications. Will continue close monitoring of HR.currently is rate controlled.  5. Type 2 diabetes mellitus. Very poor oral intake. Fair control.Capillary glucose, 133, 73, 116, 167  6. Hypertension. Off anti-hypertensive agents.   7. History of pulmonary embolism.on apixaban.  8. Healthcare associated pneumonia and urinary tract infection.Resolved.  9. Severe protein caloric malnutrition. Noted, poor oral intake.   Procedures:  None   Consultations:  None  Discharge Instructions  Discharge Instructions    Diet - low sodium heart healthy    Complete by:  As directed    Increase activity slowly    Complete by:  As directed      Allergies as of 10/30/2016   No Known Allergies     Medication List    STOP taking these medications   amiodarone 200 MG tablet Commonly known as:  PACERONE   doxycycline 100 MG capsule Commonly known as:  VIBRAMYCIN   gabapentin 300 MG capsule Commonly known as:  NEURONTIN   oxyCODONE-acetaminophen 7.5-325 MG tablet Commonly known as:  PERCOCET   potassium chloride 10 MEQ tablet Commonly known as:  K-DUR   Potassium Chloride 40 MEQ/15ML (20%) Soln   promethazine 25 MG tablet Commonly known as:  PHENERGAN   sertraline 25 MG  tablet Commonly known as:  ZOLOFT   sucralfate 1 GM/10ML suspension Commonly known as:  CARAFATE   tamsulosin 0.4 MG Caps capsule Commonly known as:  FLOMAX     TAKE these medications   apixaban 2.5 MG  Tabs tablet Commonly known as:  ELIQUIS Take 1 tablet (2.5 mg total) by mouth 2 (two) times daily.   atorvastatin 20 MG tablet Commonly known as:  LIPITOR Take 20 mg by mouth at bedtime.   docusate sodium 100 MG capsule Commonly known as:  COLACE Take 1 capsule (100 mg total) by mouth 2 (two) times daily.   feeding supplement (ENSURE ENLIVE) Liqd Take 237 mLs by mouth 3 (three) times daily between meals.   ferrous sulfate 325 (65 FE) MG tablet Take 1 tablet (325 mg total) by mouth daily with breakfast.   magnesium oxide 400 (241.3 Mg) MG tablet Commonly known as:  MAG-OX Take 1 tablet (400 mg total) by mouth daily.   megestrol 40 MG tablet Commonly known as:  MEGACE Take 1 tablet (40 mg total) by mouth daily.   metoCLOPramide 5 MG tablet Commonly known as:  REGLAN Take 1 tablet (5 mg total) by mouth 2 (two) times daily.   multivitamin with minerals Tabs tablet Take 1 tablet by mouth daily.   ondansetron 4 MG disintegrating tablet Commonly known as:  ZOFRAN-ODT Take 4 mg by mouth every 6 (six) hours as needed for nausea or vomiting.   pantoprazole 40 MG tablet Commonly known as:  PROTONIX Take 1 tablet (40 mg total) by mouth 2 (two) times daily.   polyethylene glycol packet Commonly known as:  MIRALAX Take 17 g by mouth daily.      No Known Allergies    The results of significant diagnostics from this hospitalization (including imaging, microbiology, ancillary and laboratory) are listed below for reference.    Significant Diagnostic Studies: Ct Abdomen Pelvis Wo Contrast  Result Date: 10/06/2016 CLINICAL DATA:  83 year old female with hypoglycemia, hematemesis, altered mental status. Possible upper GI bleed. EXAM: CT ABDOMEN AND PELVIS WITHOUT CONTRAST TECHNIQUE: Multidetector CT imaging of the abdomen and pelvis was performed following the standard protocol without IV contrast. COMPARISON:  07/28/2016 CT Abdomen and Pelvis and earlier. FINDINGS: Lower chest:  Trace layering right pleural effusion. Small left pleural effusion seen in July does not persist. Otherwise stable visible lung bases. Hepatobiliary: Distended gallbladder with no pericholecystic inflammation (series 3, image 31). Negative noncontrast visualized liver. Pancreas: Diminutive, negative. Spleen: Incompletely visualized, negative. Adrenals/Urinary Tract: Normal adrenal glands. Unusual appearing density in the right renal collecting system and pelvis is more apparent than in July. , but the right renal collecting system was highly abnormal on a CT urogram study 06/22/2016 (please see that report). In the left renal collecting system there is only punctate new density which more resembles nephrolithiasis. There is no hydroureter. The urinary bladder is unremarkable. Stomach/Bowel: Decreased retained stool in the rectum since July. Decompressed sigmoid colon and distal descending colon. Similar retained stool at the splenic flexure. Redundant transverse colon is mostly decompressed. Decompressed hepatic flexure and right colon. Decompressed terminal ileum and small bowel throughout the abdomen. Partially visible chronic gastric hiatal hernia. The intraabdominal portion of the stomach and the duodenum appear unremarkable. No abdominal free fluid or free air. Vascular/Lymphatic: Aortoiliac calcified atherosclerosis. Vascular patency is not evaluated in the absence of IV contrast. No lymphadenopathy. Reproductive: Negative. Other: No pelvic free fluid. Musculoskeletal: Stable.  Chronic L3 compression fracture. IMPRESSION: 1. No bowel obstruction or bowel inflammation.  Partially visible gastric hiatal hernia, but normal noncontrast appearance of the visualized portion of the stomach and duodenum. 2. Distended gallbladder but no CT evidence of acute cholecystitis. 3. No evidence of acute obstructive uropathy, but chronically abnormal right renal collecting system. As described on prior studies the differential  considerations include transitional cell carcinoma (perhaps treated/calcified), chronic fungal infection, chronic blood clots. 4. Small right pleural effusion has decreased. Interval resolved small left pleural effusion. 5.  Aortic Atherosclerosis (ICD10-I70.0). Electronically Signed   By: Genevie Ann M.D.   On: 10/06/2016 16:23   Dg Chest 2 View  Result Date: 10/10/2016 CLINICAL DATA:  Dyspnea EXAM: CHEST  2 VIEW COMPARISON:  Portable chest x-ray of October 05, 2016 FINDINGS: The lungs are mildly hyperinflated but clear. The heart and pulmonary vascularity are normal. There is calcification in the wall of the aortic arch. There is no pleural effusion. The bony thorax exhibits no acute abnormality. There are degenerative changes of both shoulders. IMPRESSION: Hyperinflation consistent with chronic bronchitis. No CHF nor pneumonia. Thoracic aortic atherosclerosis. Electronically Signed   By: David  Martinique M.D.   On: 10/10/2016 09:47   Dg Abd 1 View  Result Date: 10/05/2016 CLINICAL DATA:  Abdominal pain with nausea EXAM: ABDOMEN - 1 VIEW COMPARISON:  08/01/2016 FINDINGS: Visualized lung bases are clear. Mild scoliosis of the spine. Nonspecific diffuse decreased bowel gas. Mild formed stool in the rectum. Small calcified phleboliths in the pelvis. IMPRESSION: Nonspecific diffuse decreased bowel gas. Mild formed stool in the rectum. Electronically Signed   By: Donavan Foil M.D.   On: 10/05/2016 21:20   Ct Head Wo Contrast  Result Date: 10/05/2016 CLINICAL DATA:  Altered level of consciousness. EXAM: CT HEAD WITHOUT CONTRAST TECHNIQUE: Contiguous axial images were obtained from the base of the skull through the vertex without intravenous contrast. COMPARISON:  CT scan of September 05, 2016. FINDINGS: Brain: Mild diffuse cortical atrophy is noted. Mild chronic ischemic white matter disease is noted. No mass effect or midline shift is noted. Ventricular size is within normal limits. There is no evidence of mass  lesion, hemorrhage or acute infarction. Vascular: No hyperdense vessel or unexpected calcification. Skull: Normal. Negative for fracture or focal lesion. Sinuses/Orbits: No acute finding. Other: None. IMPRESSION: Mild diffuse cortical atrophy. Mild chronic ischemic white matter disease. No acute intracranial abnormality seen. Electronically Signed   By: Marijo Conception, M.D.   On: 10/05/2016 17:10   US Venous Img Upper Uni Left  Result Date: 10/04/2016 CLINICAL DATA:  Two week history of unexplained left upper extremity edema. EXAM: LEFT UPPER EXTREMITY VENOUS DOPPLER ULTRASOUND TECHNIQUE: Gray-scale sonography with graded compression, as well as color Doppler and duplex ultrasound were performed to evaluate the upper extremity deep venous system from the level of the subclavian vein and including the jugular, axillary, basilic, radial, ulnar and upper cephalic vein. Spectral Doppler was utilized to evaluate flow at rest and with distal augmentation maneuvers. COMPARISON:  09/19/2016. FINDINGS: Contralateral Right Subclavian Vein: Respiratory phasicity is normal and symmetric with the symptomatic side. No evidence of thrombus. Left Internal Jugular Vein: No evidence of thrombus. Normal compressibility, respiratory phasicity and response to augmentation. Subclavian Vein: No evidence of thrombus. Normal compressibility, respiratory phasicity and response to augmentation. Axillary Vein: No evidence of thrombus. Normal compressibility, respiratory phasicity and response to augmentation. Cephalic Vein: Difficult to visualize due to the edema in the upper arm. No visible thrombus. Basilic Vein: No evidence of thrombus. Normal compressibility, respiratory phasicity and response to augmentation. Brachial  Veins: No evidence of thrombus. Normal compressibility, respiratory phasicity and response to augmentation. Radial Veins: No evidence of thrombus. Normal compressibility, respiratory phasicity and response to  augmentation. Ulnar Veins: No evidence of thrombus. Normal compressibility, respiratory phasicity and response to augmentation. Venous Reflux:  Not evaluated. Other Findings:  Subcutaneous edema involving the upper arm. IMPRESSION: No evidence of DVT involving the left upper extremity. Electronically Signed   By: Evangeline Dakin M.D.   On: 10/04/2016 17:02   Dg Chest Port 1 View  Result Date: 10/05/2016 CLINICAL DATA:  Altered mental status EXAM: PORTABLE CHEST 1 VIEW COMPARISON:  09/15/2016 FINDINGS: Normal removal of right PICC line. Airspace opacity noted in the right infrahilar region. Possible small right effusion. Left lung is clear. Heart is normal size. No acute bony abnormality. IMPRESSION: Right infrahilar airspace opacity, atelectasis versus pneumonia. Small right effusion. Electronically Signed   By: Rolm Baptise M.D.   On: 10/05/2016 15:12    Microbiology: No results found for this or any previous visit (from the past 240 hour(s)).   Labs: Basic Metabolic Panel:  Recent Labs Lab 10/24/16 0641 10/27/16 0545 10/28/16 0458 10/28/16 1411 10/29/16 0427  NA 148* 147* 147*  --  146*  K 2.3* 2.8* 2.2*  --  3.7  CL 108 108 106  --  113*  CO2 29 28 32  --  24  GLUCOSE 153* 152* 147*  --  84  BUN 20 17 17   --  17  CREATININE 1.24* 1.26* 1.26*  --  1.24*  CALCIUM 8.3* 8.1* 8.1*  --  7.4*  MG  --   --   --  1.9  --    Liver Function Tests: No results for input(s): AST, ALT, ALKPHOS, BILITOT, PROT, ALBUMIN in the last 168 hours. No results for input(s): LIPASE, AMYLASE in the last 168 hours. No results for input(s): AMMONIA in the last 168 hours. CBC: No results for input(s): WBC, NEUTROABS, HGB, HCT, MCV, PLT in the last 168 hours. Cardiac Enzymes: No results for input(s): CKTOTAL, CKMB, CKMBINDEX, TROPONINI in the last 168 hours. BNP: BNP (last 3 results)  Recent Labs  03/07/16 1559 08/21/16 0241 09/05/16 2002  BNP 87.0 94.0 248.0*    ProBNP (last 3 results) No  results for input(s): PROBNP in the last 8760 hours.  CBG:  Recent Labs Lab 10/29/16 0801 10/29/16 1219 10/29/16 1649 10/29/16 2151 10/30/16 0755  GLUCAP 116* 167* 94 84 114*       Signed:  HERNANDEZ ACOSTA,ESTELA  [facility] Pager: [phone] 10/30/2016, 10:34 AM

## 2016-10-31 LAB — GLUCOSE, CAPILLARY
GLUCOSE-CAPILLARY: 109 mg/dL — AB (ref 65–99)
GLUCOSE-CAPILLARY: 137 mg/dL — AB (ref 65–99)
GLUCOSE-CAPILLARY: 134 mg/dL — AB (ref 65–99)
Glucose-Capillary: 166 mg/dL — ABNORMAL HIGH (ref 65–99)

## 2016-10-31 NOTE — Progress Notes (Signed)
PROGRESS NOTE    Sherry [name]  PYP:[id number] DOB: [birth year] DOA: 10/05/2016 PCP: [name], MD     Brief Narrative:  83 year old woman with prolonged hospitalization initially admitted from SNF due to altered mentation, found to have UTI and hospital-acquired pneumonia. She is currently pending placement.   Assessment & Plan:   Principal Problem:   Pneumonia Active Problems:   Chronic kidney disease (CKD), stage III (moderate) (HCC)   Essential hypertension   Nausea & vomiting   1.Metabolic encephalopathy in the setting of dementia. Patient awake and responsive when aroused but generally drowsy and sleepy, continues refusing to take her medications.   2. Generalized deconditioning, severe malnutrition, calorie-protein. Encouraging po intake.  3. Chronic kidney disease stage III with right renal mass and hypokalemia. Potassium was 2.2 on 10/20, this has improved with IV supplementation to 3.7. Magnesium is within normal limits at 1.9.  4. Atrial fibrillation/ paroxysmal. Not taking any medications. Will continue close monitoring of HR.currently is rate controlled.  5. Type 2 diabetes mellitus. Very poor oral intake. Fair control.Capillary glucose, 114, 141, 139, 93, 109  6. Hypertension. Off anti-hypertensive agents.   7. History of pulmonary embolism.on apixaban.  8. Healthcare associated pneumonia and urinary tract infection.Resolved.  9. Severe protein caloric malnutrition. Noted, poor oral intake.     DVT prophylaxis: Eliquis Code Status: Full code Family Communication: Patient only Disposition Plan: SNF once bed available  Consultants:   Palliative Care  Procedures:   None  Antimicrobials:  Anti-infectives    Start     Dose/Rate Route Frequency Ordered Stop   10/08/16 1430  amoxicillin-clavulanate (AUGMENTIN) 500-125 MG per tablet 500 mg  Status:  Discontinued     1 tablet Oral 2 times daily 10/08/16 1419 10/13/16 1429   10/06/16  1800  vancomycin (VANCOCIN) IVPB 750 mg/150 ml premix  Status:  Discontinued     750 mg 150 mL/hr over 60 Minutes Intravenous Every 24 hours 10/05/16 1705 10/06/16 1334   10/06/16 1800  vancomycin (VANCOCIN) 500 mg in sodium chloride 0.9 % 100 mL IVPB  Status:  Discontinued     500 mg 100 mL/hr over 60 Minutes Intravenous Every 24 hours 10/06/16 1334 10/06/16 1336   10/06/16 1800  vancomycin (VANCOCIN) IVPB 750 mg/150 ml premix  Status:  Discontinued     750 mg 150 mL/hr over 60 Minutes Intravenous Every 24 hours 10/06/16 1336 10/07/16 1028   10/06/16 1700  ceFEPIme (MAXIPIME) 1 g in dextrose 5 % 50 mL IVPB  Status:  Discontinued     1 g 100 mL/hr over 30 Minutes Intravenous Every 24 hours 10/05/16 1831 10/08/16 1419   10/05/16 2200  ceFEPIme (MAXIPIME) 1 g in dextrose 5 % 50 mL IVPB  Status:  Discontinued     1 g 100 mL/hr over 30 Minutes Intravenous Every 8 hours 10/05/16 2042 10/05/16 2102   10/05/16 1700  vancomycin (VANCOCIN) 1,500 mg in sodium chloride 0.9 % 500 mL IVPB     1,500 mg 250 mL/hr over 120 Minutes Intravenous  Once 10/05/16 1636 10/05/16 2016   10/05/16 1615  ceFEPIme (MAXIPIME) 1 g in dextrose 5 % 50 mL IVPB  Status:  Discontinued     1 g 100 mL/hr over 30 Minutes Intravenous Every 8 hours 10/05/16 1609 10/05/16 1831       Subjective: In chair, sleeping, arouses to voice but not very interactive, denies pain, continues to refuse to take medications.  Objective: Vitals:   10/30/16 0430 10/30/16 1440  10/30/16 2158 10/31/16 0459  BP: (!) 146/87 (!) 134/44 (!) 136/46 (!) 130/45  Pulse: 91 77 78 77  Resp: 16 16 17 18   Temp: 97.7 F (36.5 C) 98.5 F (36.9 C) 98.3 F (36.8 C) 98.2 F (36.8 C)  TempSrc: Oral Oral    SpO2: 100% 98% 99% 98%  Weight:      Height:        Intake/Output Summary (Last 24 hours) at 10/31/16 1101 Last data filed at 10/31/16 0930  Gross per 24 hour  Intake              330 ml  Output              250 ml  Net               80 ml    Filed Weights   10/05/16 2049  Weight: 48.7 kg (107 lb 4.8 oz)    Examination:  Is sitting in chair, no distress, appears chronically ill and cachectic    Data Reviewed: I have personally reviewed following labs and imaging studies  CBC: No results for input(s): WBC, NEUTROABS, HGB, HCT, MCV, PLT in the last 168 hours. Basic Metabolic Panel:  Recent Labs Lab 10/27/16 0545 10/28/16 0458 10/28/16 1411 10/29/16 0427  NA 147* 147*  --  146*  K 2.8* 2.2*  --  3.7  CL 108 106  --  113*  CO2 28 32  --  24  GLUCOSE 152* 147*  --  84  BUN 17 17  --  17  CREATININE 1.26* 1.26*  --  1.24*  CALCIUM 8.1* 8.1*  --  7.4*  MG  --   --  1.9  --    GFR: Estimated Creatinine Clearance: 24.6 mL/min (A) (by C-G formula based on SCr of 1.24 mg/dL (H)). Liver Function Tests: No results for input(s): AST, ALT, ALKPHOS, BILITOT, PROT, ALBUMIN in the last 168 hours. No results for input(s): LIPASE, AMYLASE in the last 168 hours. No results for input(s): AMMONIA in the last 168 hours. Coagulation Profile: No results for input(s): INR, PROTIME in the last 168 hours. Cardiac Enzymes: No results for input(s): CKTOTAL, CKMB, CKMBINDEX, TROPONINI in the last 168 hours. BNP (last 3 results) No results for input(s): PROBNP in the last 8760 hours. HbA1C: No results for input(s): HGBA1C in the last 72 hours. CBG:  Recent Labs Lab 10/30/16 0755 10/30/16 1215 10/30/16 1704 10/30/16 2131 10/31/16 0732  GLUCAP 114* 141* 139* 93 109*   Lipid Profile: No results for input(s): CHOL, HDL, LDLCALC, TRIG, CHOLHDL, LDLDIRECT in the last 72 hours. Thyroid Function Tests: No results for input(s): TSH, T4TOTAL, FREET4, T3FREE, THYROIDAB in the last 72 hours. Anemia Panel: No results for input(s): VITAMINB12, FOLATE, FERRITIN, TIBC, IRON, RETICCTPCT in the last 72 hours. Urine analysis:    Component Value Date/Time   COLORURINE YELLOW 10/05/2016 1448   APPEARANCEUR HAZY (A) 10/05/2016 1448    APPEARANCEUR Clear 10/21/2012 1333   LABSPEC 1.020 10/05/2016 1448   LABSPEC 1.006 10/21/2012 1333   PHURINE 6.0 10/05/2016 1448   GLUCOSEU 100 (A) 10/05/2016 1448   GLUCOSEU 50 mg/dL 10/21/2012 1333   HGBUR MODERATE (A) 10/05/2016 1448   BILIRUBINUR NEGATIVE 10/05/2016 1448   BILIRUBINUR Negative 10/21/2012 1333   KETONESUR >80 (A) 10/05/2016 1448   PROTEINUR 100 (A) 10/05/2016 1448   NITRITE NEGATIVE 10/05/2016 1448   LEUKOCYTESUR SMALL (A) 10/05/2016 1448   LEUKOCYTESUR Negative 10/21/2012 1333   Sepsis Labs: @LABRCNTIP (procalcitonin:4,lacticidven:4)  )  No results found for this or any previous visit (from the past 240 hour(s)).       Radiology Studies: No results found.      Scheduled Meds: . apixaban  2.5 mg Oral BID  . feeding supplement (ENSURE ENLIVE)  237 mL Oral TID BM  . insulin aspart  0-9 Units Subcutaneous TID WC   Continuous Infusions:   LOS: 26 days    Time spent: 15 minutes. Greater than 50% of this time was spent in direct contact with the patient coordinating care.     Lelon Frohlich, MD [facility] Pager [phone]  If 7PM-7AM, please contact night-coverage [email or web address] Password [email or web address] 10/31/2016, 11:01 AM

## 2016-11-01 DIAGNOSIS — G43A0 Cyclical vomiting, not intractable: Secondary | ICD-10-CM

## 2016-11-01 LAB — GLUCOSE, CAPILLARY
GLUCOSE-CAPILLARY: 128 mg/dL — AB (ref 65–99)
GLUCOSE-CAPILLARY: 155 mg/dL — AB (ref 65–99)
GLUCOSE-CAPILLARY: 119 mg/dL — AB (ref 65–99)
Glucose-Capillary: 145 mg/dL — ABNORMAL HIGH (ref 65–99)

## 2016-11-01 NOTE — Progress Notes (Signed)
PROGRESS NOTE    Sherry [name]  BPZ:[id number] DOB: 08-Feb-1927 DOA: 10/05/2016 PCP: [name], MD     Brief Narrative:  83 year old woman with prolonged hospitalization initially admitted from SNF due to altered mentation, found to have UTI and hospital-acquired pneumonia. She is currently pending placement.   Assessment & Plan:   Principal Problem:   Pneumonia Active Problems:   Chronic kidney disease (CKD), stage III (moderate) (HCC)   Essential hypertension   Nausea & vomiting   Metabolic encephalopathy in the setting of dementia.  Any acute encephalopathy has resolved and she is now in her normal state of health, with advanced dementia.  Refusing to take medications.   Failure to thrive From dementia. Palliative care were consulted.  -Encourage PO intake  Chronic kidney disease stage III  Stable  Hypokalemia Supplemented -Repeat BMP in 1 week, around 10/31  Afib CHADS2Vasc 3.  Refuses pills, anticoagulation.  Diabetes Poor oral intake  Hypertension No longer taking antihypertensives  Pelvic mass Urology consulted during this hospitalization.  Biopsy deferred by Urology, given patient's overall poor functional status.  Not an operative candidate. -Referral to Hospice would be warranted if patient continues to fail to thrive  History of PE Patient refusing apixaban  Healthcare associated pneumonia and urinary tract infection Resolved.       DVT prophylaxis: Eliquis Code Status: DO NOT RESUSICTATE Family Communication: Patient only; called twice to daughter, no answer Disposition Plan: SNF once bed available  Consultants:   Palliative Care, Urology, Psychiatry  Procedures:   None  Antimicrobials:  Anti-infectives    Start     Dose/Rate Route Frequency Ordered Stop   10/08/16 1430  amoxicillin-clavulanate (AUGMENTIN) 500-125 MG per tablet 500 mg  Status:  Discontinued     1 tablet Oral 2 times daily 10/08/16 1419 10/13/16 1429   10/06/16 1800  vancomycin (VANCOCIN) IVPB 750 mg/150 ml premix  Status:  Discontinued     750 mg 150 mL/hr over 60 Minutes Intravenous Every 24 hours 10/05/16 1705 10/06/16 1334   10/06/16 1800  vancomycin (VANCOCIN) 500 mg in sodium chloride 0.9 % 100 mL IVPB  Status:  Discontinued     500 mg 100 mL/hr over 60 Minutes Intravenous Every 24 hours 10/06/16 1334 10/06/16 1336   10/06/16 1800  vancomycin (VANCOCIN) IVPB 750 mg/150 ml premix  Status:  Discontinued     750 mg 150 mL/hr over 60 Minutes Intravenous Every 24 hours 10/06/16 1336 10/07/16 1028   10/06/16 1700  ceFEPIme (MAXIPIME) 1 g in dextrose 5 % 50 mL IVPB  Status:  Discontinued     1 g 100 mL/hr over 30 Minutes Intravenous Every 24 hours 10/05/16 1831 10/08/16 1419   10/05/16 2200  ceFEPIme (MAXIPIME) 1 g in dextrose 5 % 50 mL IVPB  Status:  Discontinued     1 g 100 mL/hr over 30 Minutes Intravenous Every 8 hours 10/05/16 2042 10/05/16 2102   10/05/16 1700  vancomycin (VANCOCIN) 1,500 mg in sodium chloride 0.9 % 500 mL IVPB     1,500 mg 250 mL/hr over 120 Minutes Intravenous  Once 10/05/16 1636 10/05/16 2016   10/05/16 1615  ceFEPIme (MAXIPIME) 1 g in dextrose 5 % 50 mL IVPB  Status:  Discontinued     1 g 100 mL/hr over 30 Minutes Intravenous Every 8 hours 10/05/16 1609 10/05/16 1831       Subjective: Rouses to voice.  Not oriented to place, situation.  Asks to go home with her children.  Tearful.  Denies pain.    Objective: Vitals:   10/30/16 2158 10/31/16 0459 10/31/16 2035 11/01/16 0519  BP: (!) 136/46 (!) 130/45 (!) 129/52 (!) 126/49  Pulse: 78 77 90 72  Resp: 17 18 17 17   Temp: 98.3 F (36.8 C) 98.2 F (36.8 C) 98.3 F (36.8 C) 98 F (36.7 C)  TempSrc:   Oral   SpO2: 99% 98% 99% 98%  Weight:      Height:        Intake/Output Summary (Last 24 hours) at 11/01/16 1533 Last data filed at 11/01/16 0916  Gross per 24 hour  Intake              340 ml  Output              500 ml  Net             -160 ml    Filed Weights   10/05/16 2049  Weight: 48.7 kg (107 lb 4.8 oz)    Examination: Is sitting in chair, no distress, appears chronically ill and cachectic    Data Reviewed: I have personally reviewed following labs and imaging studies  CBC: No results for input(s): WBC, NEUTROABS, HGB, HCT, MCV, PLT in the last 168 hours. Basic Metabolic Panel:  Recent Labs Lab 10/27/16 0545 10/28/16 0458 10/28/16 1411 10/29/16 0427  NA 147* 147*  --  146*  K 2.8* 2.2*  --  3.7  CL 108 106  --  113*  CO2 28 32  --  24  GLUCOSE 152* 147*  --  84  BUN 17 17  --  17  CREATININE 1.26* 1.26*  --  1.24*  CALCIUM 8.1* 8.1*  --  7.4*  MG  --   --  1.9  --    GFR: Estimated Creatinine Clearance: 24.6 mL/min (A) (by C-G formula based on SCr of 1.24 mg/dL (H)). Liver Function Tests: No results for input(s): AST, ALT, ALKPHOS, BILITOT, PROT, ALBUMIN in the last 168 hours. No results for input(s): LIPASE, AMYLASE in the last 168 hours. No results for input(s): AMMONIA in the last 168 hours. Coagulation Profile: No results for input(s): INR, PROTIME in the last 168 hours. Cardiac Enzymes: No results for input(s): CKTOTAL, CKMB, CKMBINDEX, TROPONINI in the last 168 hours. BNP (last 3 results) No results for input(s): PROBNP in the last 8760 hours. HbA1C: No results for input(s): HGBA1C in the last 72 hours. CBG:  Recent Labs Lab 10/31/16 1234 10/31/16 1653 10/31/16 2127 11/01/16 0730 11/01/16 1256  GLUCAP 137* 134* 166* 128* 155*   Lipid Profile: No results for input(s): CHOL, HDL, LDLCALC, TRIG, CHOLHDL, LDLDIRECT in the last 72 hours. Thyroid Function Tests: No results for input(s): TSH, T4TOTAL, FREET4, T3FREE, THYROIDAB in the last 72 hours. Anemia Panel: No results for input(s): VITAMINB12, FOLATE, FERRITIN, TIBC, IRON, RETICCTPCT in the last 72 hours. Urine analysis:    Component Value Date/Time   COLORURINE YELLOW 10/05/2016 1448   APPEARANCEUR HAZY (A) 10/05/2016 1448    APPEARANCEUR Clear 10/21/2012 1333   LABSPEC 1.020 10/05/2016 1448   LABSPEC 1.006 10/21/2012 1333   PHURINE 6.0 10/05/2016 1448   GLUCOSEU 100 (A) 10/05/2016 1448   GLUCOSEU 50 mg/dL 10/21/2012 1333   HGBUR MODERATE (A) 10/05/2016 1448   BILIRUBINUR NEGATIVE 10/05/2016 1448   BILIRUBINUR Negative 10/21/2012 1333   KETONESUR >80 (A) 10/05/2016 1448   PROTEINUR 100 (A) 10/05/2016 1448   NITRITE NEGATIVE 10/05/2016 1448   LEUKOCYTESUR SMALL (A) 10/05/2016  [place] Negative 10/21/2012 1333   Sepsis Labs: @LABRCNTIP (procalcitonin:4,lacticidven:4)  )No results found for this or any previous visit (from the past 240 hour(s)).       Radiology Studies: No results found.      Scheduled Meds: . apixaban  2.5 mg Oral BID  . feeding supplement (ENSURE ENLIVE)  237 mL Oral TID BM  . insulin aspart  0-9 Units Subcutaneous TID WC   Continuous Infusions:   LOS: 27 days    Time spent: 15 minutes.      Edwin Dada, MD [facility]   If 7PM-7AM, please contact night-coverage [email or web address] Password [facility] 11/01/2016, 3:33 PM

## 2016-11-01 NOTE — Progress Notes (Signed)
CSW following to facilitate discharge to SNF. CSW contacted Medical Director to discuss case and lack of facilities that would accept the patient. Medical Director made phone calls and called back with information that he expects either [facility] or [facility] to call back tomorrow that they will make a bed offer on the patient. Medical Director will update CSW with information when available.  CSW to continue to follow.  Laveda Abbe, [place] Clinical Social Worker [phone]

## 2016-11-01 NOTE — Progress Notes (Signed)
CSW following to facilitate discharge to SNF. CSW contacted by CSW AD that [facility] would be willing to take a look at the patient's clinical information to determine if they could make a bed offer or not. CSW sent clinical information, and called Admissions to discuss the patient's case. [facility] admissions indicated that they would not be able to make a bed offer for the patient at this time.  CSW will continue to follow.  Laveda Abbe, [place] Clinical Social Worker [phone]

## 2016-11-02 DIAGNOSIS — R627 Adult failure to thrive: Secondary | ICD-10-CM

## 2016-11-02 DIAGNOSIS — F0281 Dementia in other diseases classified elsewhere with behavioral disturbance: Secondary | ICD-10-CM

## 2016-11-02 DIAGNOSIS — G20 Parkinson's disease: Secondary | ICD-10-CM

## 2016-11-02 LAB — GLUCOSE, CAPILLARY
Glucose-Capillary: 113 mg/dL — ABNORMAL HIGH (ref 65–99)
Glucose-Capillary: 130 mg/dL — ABNORMAL HIGH (ref 65–99)
Glucose-Capillary: 98 mg/dL (ref 65–99)
Glucose-Capillary: 101 mg/dL — ABNORMAL HIGH (ref 65–99)

## 2016-11-02 NOTE — Care Management Important Message (Signed)
Important Message  Patient Details  Name: Sherry [name] MRN: [id number] Date of Birth: [birth year]   Medicare Important Message Given:  Yes    Nathen May 11/02/2016, 10:46 AM

## 2016-11-02 NOTE — Progress Notes (Signed)
Report called to Jenny Reichmann, Therapist, sports at [facility]. Await ambulance transport.

## 2016-11-02 NOTE — Social Work (Signed)
CSW was advised patient will need letter from doctor indicating that she does not have capacity to make decisions. CSW called Dr. Loleta Books and unable to reach. CSW then sent message. CSW then called nurse on floor to advsie that patient will not be discharging today as additional information needed prior to transfer.  Clinical supervisor, Zack f/u with APS on the transfer to [facility].  CSW will continue to follow up for discharge.  Elissa Hefty, LCSW Clinical Social Worker [phone]

## 2016-11-02 NOTE — Social Work (Signed)
Clinical Social Worker facilitated patient discharge including contacting patient family and facility to confirm patient discharge plans.  Clinical information faxed to facility and family agreeable with plan.    CSW arranged ambulance transport via [facility] to [facility], [address], [place], [place] [address] .    RN to call [phone] to give report prior to discharge.  Clinical Social Worker will sign off for now as social work intervention is no longer needed. Please consult [place] again if new need arises.  Elissa Hefty, LCSW Clinical Social Worker [phone]

## 2016-11-02 NOTE — Clinical Social Work Placement (Signed)
   CLINICAL SOCIAL WORK PLACEMENT  NOTE  Date:  11/02/2016  Patient Details  Name: Sherry [name] MRN: [id number] Date of Birth: 11/01/1927  Clinical Social Work is seeking post-discharge placement for this patient at the [place] level of care (*CSW will initial, date and re-position this form in  chart as items are completed):  Yes   Patient/family provided with [place] Work Department's list of facilities offering this level of care within the geographic area requested by the patient (or if unable, by the patient's family).  Yes   Patient/family informed of their freedom to choose among providers that offer the needed level of care, that participate in Medicare, Medicaid or managed care program needed by the patient, have an available bed and are willing to accept the patient.  Yes   Patient/family informed of [facility]'s ownership interest in [facility] and [facility], as well as of the fact that they are under no obligation to receive care at these facilities.  PASRR submitted to EDS on       PASRR number received on       Existing PASRR number confirmed on       FL2 transmitted to all facilities in geographic area requested by pt/family on       FL2 transmitted to all facilities within larger geographic area on       Patient informed that his/her managed care company has contracts with or will negotiate with certain facilities, including the following:        Yes   Patient/family informed of bed offers received.  Patient chooses bed at  ([facility])     Physician recommends and patient chooses bed at      Patient to be transferred to  ([facility]) on 11/02/16.  Patient to be transferred to facility by [facility]     Patient family notified on 11/02/16 of transfer.  Name of family member notified:      Hospital signing patient into SNF, APS involved  PHYSICIAN Please prepare  prescriptions     Additional Comment:    _______________________________________________ Normajean Baxter, LCSW 11/02/2016, 1:09 PM

## 2016-11-02 NOTE — Social Work (Signed)
CSW was advised by clinical supervisor that we have a SNF bed at [facility].   CSW then called doctor to discuss discharge to SNF.  CSW will f/u for disposition.  Elissa Hefty, LCSW Clinical Social Worker [phone]

## 2016-11-02 NOTE — Discharge Summary (Addendum)
Physician Discharge Summary  Sherry [name] UKG:[id number] DOB: [birth year] DOA: 10/05/2016  PCP: [name], MD  Admit date: 10/05/2016 Discharge date: 11/03/2016  Admitted From: [facility] Disposition:  SNF LTC  Recommendations for Outpatient Follow-up:  1. If patient continues to refuse medicines, food, consider palliative care/Hospice evaluation  Home Health: No Equipment/Devices: No  Discharge Condition: Poor  CODE STATUS: DO NOT RESUSCITATE Diet recommendation: Regular  Brief/Interim Summary: The patient is an past medical history significant for major HTN, DM, CKD, and pulmonary embolism on apixaban who was admitted for altered mental status in the setting of repeated nausea and vomiting.     Metabolic encephalopathy from HCAP: This is in the setting of some baseline dementia.  Likely from dehydration in the setting of vomiting and suspected pneumonia.  She was treated with a full course of vancomycin and cefepime, cultures were negative.  The acute encephalopathy has resolved, but her new baseline cognitive function has declined considerably.  She is now not oriented to place, time or situation, nor persons, other than self.  She is no longer walking, and frequently refuses food.  She has had hypoactive delirium at times, throughout the last 4 weeks.  She has been evaluated by Psychiatry and Palliative Care, both of whom recommend palliative approaches, comfort cares.   Renal mass, presumed transitional cell carcinoma: This was previously known, prior workup was being managed by an outpatient Urologist, however, was delayed by her recent failure to thrive and frequent hospitalizations.  During this current hospitalization, Urology were again consulted, and felt she was not an operative candidate and biopsy/resection were deferred. Palliative care were consulted and recommended comfort cares.  Failure to thrive/dementia [name] previous chart suggests that she  was living in [facility] in long term care, and had decision-making capacity.  During this hospitalization, her mental status was persistently poor, she had hypoactive delirium, was frequently unable to speak or interact, or unwilling to interact, refused medications, and had progressively less oral intake, as outlined above.  Psychiatry were consulted, who suspected dementia and hypoactive delirium and recommended palliative care consultation.  Palliative care were unable to reach family on multiple occasions, and I likewise was unable to reach family by phone on any occasion.  It appears that the patient has vascular dementia (likely from the stepwise changes seen in her cognition), now quite advanced.  She is unable to articulate where she is or why she is here, and more often is nonverbal at all.  She likely has a prognosis <6 months given her cancer, dementia and failure to thrive.  She does not appear to have decision-making capacity, and APS have intervened to obtain guardianship.     Discharge Diagnoses:  Principal Problem:   Pneumonia Active Problems:   Chronic kidney disease (CKD), stage III (moderate) (HCC)   Essential hypertension   Nausea & vomiting    Discharge Instructions  Discharge Instructions    Diet - low sodium heart healthy    Complete by:  As directed    Increase activity slowly    Complete by:  As directed      Allergies as of 11/03/2016   No Known Allergies     Medication List    STOP taking these medications   amiodarone 200 MG tablet Commonly known as:  PACERONE   doxycycline 100 MG capsule Commonly known as:  VIBRAMYCIN   gabapentin 300 MG capsule Commonly known as:  NEURONTIN   oxyCODONE-acetaminophen 7.5-325 MG tablet Commonly  known as:  PERCOCET   potassium chloride 10 MEQ tablet Commonly known as:  K-DUR   Potassium Chloride 40 MEQ/15ML (20%) Soln   promethazine 25 MG tablet Commonly known as:  PHENERGAN   sertraline 25 MG  tablet Commonly known as:  ZOLOFT   sucralfate 1 GM/10ML suspension Commonly known as:  CARAFATE   tamsulosin 0.4 MG Caps capsule Commonly known as:  FLOMAX     TAKE these medications   apixaban 2.5 MG Tabs tablet Commonly known as:  ELIQUIS Take 1 tablet (2.5 mg total) by mouth 2 (two) times daily.   atorvastatin 20 MG tablet Commonly known as:  LIPITOR Take 20 mg by mouth at bedtime.   docusate sodium 100 MG capsule Commonly known as:  COLACE Take 1 capsule (100 mg total) by mouth 2 (two) times daily.   feeding supplement (ENSURE ENLIVE) Liqd Take 237 mLs by mouth 3 (three) times daily between meals.   ferrous sulfate 325 (65 FE) MG tablet Take 1 tablet (325 mg total) by mouth daily with breakfast.   magnesium oxide 400 (241.3 Mg) MG tablet Commonly known as:  MAG-OX Take 1 tablet (400 mg total) by mouth daily.   megestrol 40 MG tablet Commonly known as:  MEGACE Take 1 tablet (40 mg total) by mouth daily.   metoCLOPramide 5 MG tablet Commonly known as:  REGLAN Take 1 tablet (5 mg total) by mouth 2 (two) times daily.   multivitamin with minerals Tabs tablet Take 1 tablet by mouth daily.   ondansetron 4 MG disintegrating tablet Commonly known as:  ZOFRAN-ODT Take 4 mg by mouth every 6 (six) hours as needed for nausea or vomiting.   pantoprazole 40 MG tablet Commonly known as:  PROTONIX Take 1 tablet (40 mg total) by mouth 2 (two) times daily.   polyethylene glycol packet Commonly known as:  MIRALAX Take 17 g by mouth daily.       No Known Allergies  Consultations:  Urology, Palliative Care, Psychiatry   Procedures/Studies: Ct Abdomen Pelvis Wo Contrast  Result Date: 10/06/2016 CLINICAL DATA:  83 year old female with hypoglycemia, hematemesis, altered mental status. Possible upper GI bleed. EXAM: CT ABDOMEN AND PELVIS WITHOUT CONTRAST TECHNIQUE: Multidetector CT imaging of the abdomen and pelvis was performed following the standard protocol without  IV contrast. COMPARISON:  07/28/2016 CT Abdomen and Pelvis and earlier. FINDINGS: Lower chest: Trace layering right pleural effusion. Small left pleural effusion seen in July does not persist. Otherwise stable visible lung bases. Hepatobiliary: Distended gallbladder with no pericholecystic inflammation (series 3, image 31). Negative noncontrast visualized liver. Pancreas: Diminutive, negative. Spleen: Incompletely visualized, negative. Adrenals/Urinary Tract: Normal adrenal glands. Unusual appearing density in the right renal collecting system and pelvis is more apparent than in July. , but the right renal collecting system was highly abnormal on a CT urogram study 06/22/2016 (please see that report). In the left renal collecting system there is only punctate new density which more resembles nephrolithiasis. There is no hydroureter. The urinary bladder is unremarkable. Stomach/Bowel: Decreased retained stool in the rectum since July. Decompressed sigmoid colon and distal descending colon. Similar retained stool at the splenic flexure. Redundant transverse colon is mostly decompressed. Decompressed hepatic flexure and right colon. Decompressed terminal ileum and small bowel throughout the abdomen. Partially visible chronic gastric hiatal hernia. The intraabdominal portion of the stomach and the duodenum appear unremarkable. No abdominal free fluid or free air. Vascular/Lymphatic: Aortoiliac calcified atherosclerosis. Vascular patency is not evaluated in the absence of IV contrast. No  lymphadenopathy. Reproductive: Negative. Other: No pelvic free fluid. Musculoskeletal: Stable.  Chronic L3 compression fracture. IMPRESSION: 1. No bowel obstruction or bowel inflammation. Partially visible gastric hiatal hernia, but normal noncontrast appearance of the visualized portion of the stomach and duodenum. 2. Distended gallbladder but no CT evidence of acute cholecystitis. 3. No evidence of acute obstructive uropathy, but  chronically abnormal right renal collecting system. As described on prior studies the differential considerations include transitional cell carcinoma (perhaps treated/calcified), chronic fungal infection, chronic blood clots. 4. Small right pleural effusion has decreased. Interval resolved small left pleural effusion. 5.  Aortic Atherosclerosis (ICD10-I70.0). Electronically Signed   By: Genevie Ann M.D.   On: 10/06/2016 16:23   Dg Chest 2 View  Result Date: 10/10/2016 CLINICAL DATA:  Dyspnea EXAM: CHEST  2 VIEW COMPARISON:  Portable chest x-ray of October 05, 2016 FINDINGS: The lungs are mildly hyperinflated but clear. The heart and pulmonary vascularity are normal. There is calcification in the wall of the aortic arch. There is no pleural effusion. The bony thorax exhibits no acute abnormality. There are degenerative changes of both shoulders. IMPRESSION: Hyperinflation consistent with chronic bronchitis. No CHF nor pneumonia. Thoracic aortic atherosclerosis. Electronically Signed   By: David  Martinique M.D.   On: 10/10/2016 09:47   Dg Abd 1 View  Result Date: 10/05/2016 CLINICAL DATA:  Abdominal pain with nausea EXAM: ABDOMEN - 1 VIEW COMPARISON:  08/01/2016 FINDINGS: Visualized lung bases are clear. Mild scoliosis of the spine. Nonspecific diffuse decreased bowel gas. Mild formed stool in the rectum. Small calcified phleboliths in the pelvis. IMPRESSION: Nonspecific diffuse decreased bowel gas. Mild formed stool in the rectum. Electronically Signed   By: Donavan Foil M.D.   On: 10/05/2016 21:20   Ct Head Wo Contrast  Result Date: 10/05/2016 CLINICAL DATA:  Altered level of consciousness. EXAM: CT HEAD WITHOUT CONTRAST TECHNIQUE: Contiguous axial images were obtained from the base of the skull through the vertex without intravenous contrast. COMPARISON:  CT scan of September 05, 2016. FINDINGS: Brain: Mild diffuse cortical atrophy is noted. Mild chronic ischemic white matter disease is noted. No mass effect  or midline shift is noted. Ventricular size is within normal limits. There is no evidence of mass lesion, hemorrhage or acute infarction. Vascular: No hyperdense vessel or unexpected calcification. Skull: Normal. Negative for fracture or focal lesion. Sinuses/Orbits: No acute finding. Other: None. IMPRESSION: Mild diffuse cortical atrophy. Mild chronic ischemic white matter disease. No acute intracranial abnormality seen. Electronically Signed   By: Marijo Conception, M.D.   On: 10/05/2016 17:10   US Venous Img Upper Uni Left  Result Date: 10/04/2016 CLINICAL DATA:  Two week history of unexplained left upper extremity edema. EXAM: LEFT UPPER EXTREMITY VENOUS DOPPLER ULTRASOUND TECHNIQUE: Gray-scale sonography with graded compression, as well as color Doppler and duplex ultrasound were performed to evaluate the upper extremity deep venous system from the level of the subclavian vein and including the jugular, axillary, basilic, radial, ulnar and upper cephalic vein. Spectral Doppler was utilized to evaluate flow at rest and with distal augmentation maneuvers. COMPARISON:  09/19/2016. FINDINGS: Contralateral Right Subclavian Vein: Respiratory phasicity is normal and symmetric with the symptomatic side. No evidence of thrombus. Left Internal Jugular Vein: No evidence of thrombus. Normal compressibility, respiratory phasicity and response to augmentation. Subclavian Vein: No evidence of thrombus. Normal compressibility, respiratory phasicity and response to augmentation. Axillary Vein: No evidence of thrombus. Normal compressibility, respiratory phasicity and response to augmentation. Cephalic Vein: Difficult to visualize due to the  edema in the upper arm. No visible thrombus. Basilic Vein: No evidence of thrombus. Normal compressibility, respiratory phasicity and response to augmentation. Brachial Veins: No evidence of thrombus. Normal compressibility, respiratory phasicity and response to augmentation. Radial Veins:  No evidence of thrombus. Normal compressibility, respiratory phasicity and response to augmentation. Ulnar Veins: No evidence of thrombus. Normal compressibility, respiratory phasicity and response to augmentation. Venous Reflux:  Not evaluated. Other Findings:  Subcutaneous edema involving the upper arm. IMPRESSION: No evidence of DVT involving the left upper extremity. Electronically Signed   By: Evangeline Dakin M.D.   On: 10/04/2016 17:02   Dg Chest Port 1 View  Result Date: 10/05/2016 CLINICAL DATA:  Altered mental status EXAM: PORTABLE CHEST 1 VIEW COMPARISON:  09/15/2016 FINDINGS: Normal removal of right PICC line. Airspace opacity noted in the right infrahilar region. Possible small right effusion. Left lung is clear. Heart is normal size. No acute bony abnormality. IMPRESSION: Right infrahilar airspace opacity, atelectasis versus pneumonia. Small right effusion. Electronically Signed   By: Rolm Baptise M.D.   On: 10/05/2016 15:12      Subjective: Patient watching TV.  Staets she is in "hospital", can't articulate more, makes no response as to who I am or why she is here.  States it is 97.Denies pain.  Says she has no appetite.  Discharge Exam: Vitals:   11/02/16 2235 11/03/16 0440  BP: 135/82 134/60  Pulse: 89 94  Resp: 16 15  Temp: 98.6 F (37 C) 98.4 F (36.9 C)  SpO2: 100% 100%   Vitals:   11/02/16 0552 11/02/16 1424 11/02/16 2235 11/03/16 0440  BP: (!) 135/57 136/61 135/82 134/60  Pulse: 78 100 89 94  Resp:  16 16 15   Temp: 99 F (37.2 C) 98.4 F (36.9 C) 98.6 F (37 C) 98.4 F (36.9 C)  TempSrc: Oral Oral Oral   SpO2: 99% 100% 100% 100%  Weight:      Height:        General: Frail elderly female, rousable but tired appearing, hypoactive.   Cardiovascular: RRR, S1/S2 +, no rubs, no gallops Respiratory: CTA bilaterally, no wheezing, no rhonchi Abdominal: Soft, NT, ND, bowel sounds + Extremities: edema of right arm resolved, no leg edema. no cyanosis    The  results of significant diagnostics from this hospitalization (including imaging, microbiology, ancillary and laboratory) are listed below for reference.     Microbiology: No results found for this or any previous visit (from the past 240 hour(s)).   Labs: BNP (last 3 results)  Recent Labs  03/07/16 1559 08/21/16 0241 09/05/16 2002  BNP 87.0 94.0 314.9*   Basic Metabolic Panel:  Recent Labs Lab 10/28/16 0458 10/28/16 1411 10/29/16 0427  NA 147*  --  146*  K 2.2*  --  3.7  CL 106  --  113*  CO2 32  --  24  GLUCOSE 147*  --  84  BUN 17  --  17  CREATININE 1.26*  --  1.24*  CALCIUM 8.1*  --  7.4*  MG  --  1.9  --    Liver Function Tests: No results for input(s): AST, ALT, ALKPHOS, BILITOT, PROT, ALBUMIN in the last 168 hours. No results for input(s): LIPASE, AMYLASE in the last 168 hours. No results for input(s): AMMONIA in the last 168 hours. CBC: No results for input(s): WBC, NEUTROABS, HGB, HCT, MCV, PLT in the last 168 hours. Cardiac Enzymes: No results for input(s): CKTOTAL, CKMB, CKMBINDEX, TROPONINI in the last 168 hours.  BNP: Invalid input(s): POCBNP CBG:  Recent Labs Lab 11/02/16 0738 11/02/16 1227 11/02/16 1717 11/02/16 2137 11/03/16 0806  GLUCAP 113* 130* 98 101* 153*   D-Dimer No results for input(s): DDIMER in the last 72 hours. Hgb A1c No results for input(s): HGBA1C in the last 72 hours. Lipid Profile No results for input(s): CHOL, HDL, LDLCALC, TRIG, CHOLHDL, LDLDIRECT in the last 72 hours. Thyroid function studies No results for input(s): TSH, T4TOTAL, T3FREE, THYROIDAB in the last 72 hours.  Invalid input(s): FREET3 Anemia work up No results for input(s): VITAMINB12, FOLATE, FERRITIN, TIBC, IRON, RETICCTPCT in the last 72 hours. Urinalysis    Component Value Date/Time   COLORURINE YELLOW 10/05/2016 1448   APPEARANCEUR HAZY (A) 10/05/2016 1448   APPEARANCEUR Clear 10/21/2012 1333   LABSPEC 1.020 10/05/2016 1448   LABSPEC 1.006  10/21/2012 1333   PHURINE 6.0 10/05/2016 1448   GLUCOSEU 100 (A) 10/05/2016 1448   GLUCOSEU 50 mg/dL 10/21/2012 1333   HGBUR MODERATE (A) 10/05/2016 1448   BILIRUBINUR NEGATIVE 10/05/2016 1448   BILIRUBINUR Negative 10/21/2012 1333   KETONESUR >80 (A) 10/05/2016 1448   PROTEINUR 100 (A) 10/05/2016 1448   NITRITE NEGATIVE 10/05/2016 1448   LEUKOCYTESUR SMALL (A) 10/05/2016 1448   LEUKOCYTESUR Negative 10/21/2012 1333   Sepsis Labs Invalid input(s): PROCALCITONIN,  WBC,  LACTICIDVEN Microbiology No results found for this or any previous visit (from the past 240 hour(s)).   Time coordinating discharge: Less than 30 minutes  SIGNED:   Edwin Dada, MD  [facility] 11/03/2016, 9:57 AM   If 7PM-7AM, please contact night-coverage [email or web address] Password [email or web address]

## 2016-11-03 LAB — GLUCOSE, CAPILLARY
GLUCOSE-CAPILLARY: 153 mg/dL — AB (ref 65–99)
Glucose-Capillary: 147 mg/dL — ABNORMAL HIGH (ref 65–99)

## 2016-11-03 NOTE — NC FL2 (Signed)
[place] LEVEL OF CARE SCREENING TOOL     IDENTIFICATION  Patient Name: Sherry [name] Birthdate: 1927-07-18 Sex: female Admission Date (Current Location): 10/05/2016  [facility] and [place] Number:  Engineering geologist and Address:  [facility] [place], [place] [address] [place], [place] [address]      Provider Number: [id number]  Attending Physician Name and Address:  Edwin Dada, *  Relative Name and Phone Number:       Current Level of Care: Hospital Recommended Level of Care: [place] Prior Approval Number:    Date Approved/Denied:   PASRR Number: [id number]  Discharge Plan: SNF    Current Diagnoses: Patient Active Problem List   Diagnosis Date Noted  . Pneumonia 10/05/2016  . Hematemesis without nausea   . Hypothermia 09/13/2016  . Acute encephalopathy 09/05/2016  . Hypoglycemia 09/05/2016  . Chronic systolic CHF (congestive heart failure) ([place]) 09/05/2016  . Syncope 08/29/2016  . Chronic idiopathic constipation   . HLD (hyperlipidemia) 07/28/2016  . Fecal impaction of colon ([place]) 07/28/2016  . Fecal impaction ([place]) 07/28/2016  . Atrial fibrillation with RVR ([place]) 07/22/2016  . Dehydration   . Renal mass   . Chest pain, rule out acute myocardial infarction 07/12/2016  . Reactive depression   . Esophagitis determined by endoscopy 05/31/2016  . Hyperglycemia   . Hypokalemia   . Cystitis   . HH (hiatus hernia)   . Hiatal hernia with gastroesophageal reflux disease without esophagitis   . GI bleed 05/28/2016  . Upper GI bleeding 05/28/2016  . Pressure injury of skin 05/28/2016  . Atrial flutter ([place])   . Gastroenteritis 04/24/2016  . Aphasia 04/18/2016  . Nausea & vomiting 03/27/2016  . Advance care planning   . UTI (urinary tract infection) 03/12/2016  . Chest pain 03/09/2016  . PE (pulmonary thromboembolism) ([place]) 02/26/2016  . Diabetes mellitus type 2, insulin dependent ([place]) 02/26/2016  .  Anemia 02/26/2016  . Chronic kidney disease (CKD), stage III (moderate) ([place]) 02/26/2016  . Essential hypertension 02/26/2016  . Goals of care, counseling/discussion   . Adult failure to thrive syndrome   . Palliative care by specialist   . Sepsis ([place]) 11/26/2014    Orientation RESPIRATION BLADDER Height & Weight     Self, Time, Situation, Place (Intermittent - lacks capacity)  Normal Continent Weight: 107 lb 4.8 oz (48.7 kg) Height:  5\' 3"  (160 cm)  BEHAVIORAL SYMPTOMS/MOOD NEUROLOGICAL BOWEL NUTRITION STATUS      Continent Diet (Regular Diet with Thin Liquids)  AMBULATORY STATUS COMMUNICATION OF NEEDS Skin   Extensive Assist Verbally PU Stage and Appropriate Care   PU Stage 2 Dressing: BID                   Personal Care Assistance Level of Assistance  Bathing, Feeding, Dressing, Total care Bathing Assistance: Maximum assistance Feeding assistance: Maximum assistance Dressing Assistance: Maximum assistance     Functional Limitations Info    Sight Info: Adequate Hearing Info: Adequate Speech Info: Adequate    SPECIAL CARE FACTORS FREQUENCY  PT (By licensed PT), OT (By licensed OT)     PT Frequency: 3 xweek OT Frequency: 3x week            Contractures Contractures Info: Not present    Additional Factors Info  Code Status, Allergies, Insulin Sliding Scale Code Status Info: DNR Allergies Info: No known Allergies   Insulin Sliding Scale Info: Novolog 3x daily with meals  Current Medications (11/03/2016):  This is the current hospital active medication list Current Facility-Administered Medications  Medication Dose Route Frequency Provider Last Rate Last Dose  . acetaminophen (TYLENOL) tablet 650 mg  650 mg Oral Q6H PRN Aline August, MD       Or  . acetaminophen (TYLENOL) suppository 650 mg  650 mg Rectal Q6H PRN Aline August, MD      . apixaban (ELIQUIS) tablet 2.5 mg  2.5 mg Oral BID Aline August, MD   2.5 mg at 10/30/16 2157  . bisacodyl  (DULCOLAX) EC tablet 10 mg  10 mg Oral Daily PRN Alekh, Kshitiz, MD      . feeding supplement (ENSURE ENLIVE) (ENSURE ENLIVE) liquid 237 mL  237 mL Oral TID BM Domenic Polite, MD   Stopped at 10/29/16 1000  . insulin aspart (novoLOG) injection 0-9 Units  0-9 Units Subcutaneous TID WC Arrien, Jimmy Picket, MD   1 Units at 11/03/16 1233     Discharge Medications: Please see discharge summary for a list of discharge medications.  Relevant Imaging Results:  Relevant Lab Results:   Additional Information SSN [id number]   Barbette Or, [place]

## 2016-11-03 NOTE — Progress Notes (Signed)
PROGRESS NOTE    [name]  MWU:[id number] DOB: 11/25/1927 DOA: 10/05/2016 PCP: [name], MD     Brief Narrative:  83 year old woman with prolonged hospitalization initially admitted from SNF due to altered mentation, found to have UTI and hospital-acquired pneumonia. She is currently pending placement.   Assessment & Plan:   Principal Problem:   Pneumonia Active Problems:   Chronic kidney disease (CKD), stage III (moderate) (HCC)   Essential hypertension   Nausea & vomiting   Metabolic encephalopathy in the setting of dementia.  Any acute encephalopathy has resolved and she is now in her normal state of health, with advanced dementia.  Refusing to take medications.   Failure to thrive From dementia. Palliative care were consulted. At this point, given her suspected right renal cancer and failure to thrive and advanced dementia, pursuing aggressive or potentially harmful treatments are likely to prolong suffering, the consensus between Palliative Care specialists, Ethics and myself are to pursue only those treatments that preserve Sherry [name]'s dignity and comfort. -Encourage PO intake -Liberalize diet as part of comfort measures  Chronic kidney disease stage III  Stable  Hypokalemia Supplemented -May repeat BMP in 1 week, around 10/31  Afib CHADS2Vasc 3.  Refuses pills, anticoagulation. -Stop amiodarone, anticoagulation as part of comfort measures  Diabetes Poor oral intake  Hypertension No longer taking antihypertensives  Pelvic mass Urology consulted during this hospitalization.  Biopsy deferred by Urology, given patient's overall poor functional status.  Not an operative candidate. -Referral to Hospice as an outpatient would be warranted if patient continues to fail to thrive  History of PE Patient refusing apixaban -Discontinue anticoagulation, pursue comfort measures  Healthcare associated pneumonia and urinary tract  infection Resolved.       DVT prophylaxis: Eliquis Code Status: DO NOT RESUSICTATE Family Communication: Patient only; called twice to daughter, no answer Disposition Plan: SNF once bed available  Consultants:   Palliative Care, Urology, Psychiatry  Procedures:   None  Antimicrobials:  Anti-infectives    Start     Dose/Rate Route Frequency Ordered Stop   10/08/16 1430  amoxicillin-clavulanate (AUGMENTIN) 500-125 MG per tablet 500 mg  Status:  Discontinued     1 tablet Oral 2 times daily 10/08/16 1419 10/13/16 1429   10/06/16 1800  vancomycin (VANCOCIN) IVPB 750 mg/150 ml premix  Status:  Discontinued     750 mg 150 mL/hr over 60 Minutes Intravenous Every 24 hours 10/05/16 1705 10/06/16 1334   10/06/16 1800  vancomycin (VANCOCIN) 500 mg in sodium chloride 0.9 % 100 mL IVPB  Status:  Discontinued     500 mg 100 mL/hr over 60 Minutes Intravenous Every 24 hours 10/06/16 1334 10/06/16 1336   10/06/16 1800  vancomycin (VANCOCIN) IVPB 750 mg/150 ml premix  Status:  Discontinued     750 mg 150 mL/hr over 60 Minutes Intravenous Every 24 hours 10/06/16 1336 10/07/16 1028   10/06/16 1700  ceFEPIme (MAXIPIME) 1 g in dextrose 5 % 50 mL IVPB  Status:  Discontinued     1 g 100 mL/hr over 30 Minutes Intravenous Every 24 hours 10/05/16 1831 10/08/16 1419   10/05/16 2200  ceFEPIme (MAXIPIME) 1 g in dextrose 5 % 50 mL IVPB  Status:  Discontinued     1 g 100 mL/hr over 30 Minutes Intravenous Every 8 hours 10/05/16 2042 10/05/16 2102   10/05/16 1700  vancomycin (VANCOCIN) 1,500 mg in sodium chloride 0.9 % 500 mL IVPB     1,500 mg 250 mL/hr over  120 Minutes Intravenous  Once 10/05/16 1636 10/05/16 2016   10/05/16 1615  ceFEPIme (MAXIPIME) 1 g in dextrose 5 % 50 mL IVPB  Status:  Discontinued     1 g 100 mL/hr over 30 Minutes Intravenous Every 8 hours 10/05/16 1609 10/05/16 1831       Subjective: Patient sleeping.  She groans when I rouse her.  She does not participate in conversation  or exam.   Objective: Vitals:   11/02/16 0552 11/02/16 1424 11/02/16 2235 11/03/16 0440  BP: (!) 135/57 136/61 135/82 134/60  Pulse: 78 100 89 94  Resp:  16 16 15   Temp: 99 F (37.2 C) 98.4 F (36.9 C) 98.6 F (37 C) 98.4 F (36.9 C)  TempSrc: Oral Oral Oral   SpO2: 99% 100% 100% 100%  Weight:      Height:        Intake/Output Summary (Last 24 hours) at 11/03/16 1001 Last data filed at 11/03/16 0445  Gross per 24 hour  Intake              120 ml  Output              250 ml  Net             -130 ml   Filed Weights   10/05/16 2049  Weight: 48.7 kg (107 lb 4.8 oz)    Examination: Gen Frail elderly female, awakes to voice, sluggish, does not respond meaningfully to quesitons again today Card negative Resp Negative Skin Senile purpura, no suspicious rashes Extremities Swelling in right arm, none in left, no swelling in legs, no deformities Neuro Moves upper extremities equally, sluggish, weak, does not roll over or open eyes. Psych: Unable to assess    Data Reviewed: I have personally reviewed following labs and imaging studies  CBC: No results for input(s): WBC, NEUTROABS, HGB, HCT, MCV, PLT in the last 168 hours. Basic Metabolic Panel:  Recent Labs Lab 10/28/16 0458 10/28/16 1411 10/29/16 0427  NA 147*  --  146*  K 2.2*  --  3.7  CL 106  --  113*  CO2 32  --  24  GLUCOSE 147*  --  84  BUN 17  --  17  CREATININE 1.26*  --  1.24*  CALCIUM 8.1*  --  7.4*  MG  --  1.9  --    GFR: Estimated Creatinine Clearance: 24.6 mL/min (A) (by C-G formula based on SCr of 1.24 mg/dL (H)). Liver Function Tests: No results for input(s): AST, ALT, ALKPHOS, BILITOT, PROT, ALBUMIN in the last 168 hours. No results for input(s): LIPASE, AMYLASE in the last 168 hours. No results for input(s): AMMONIA in the last 168 hours. Coagulation Profile: No results for input(s): INR, PROTIME in the last 168 hours. Cardiac Enzymes: No results for input(s): CKTOTAL, CKMB, CKMBINDEX,  TROPONINI in the last 168 hours. BNP (last 3 results) No results for input(s): PROBNP in the last 8760 hours. HbA1C: No results for input(s): HGBA1C in the last 72 hours. CBG:  Recent Labs Lab 11/02/16 0738 11/02/16 1227 11/02/16 1717 11/02/16 2137 11/03/16 0806  GLUCAP 113* 130* 98 101* 153*   Lipid Profile: No results for input(s): CHOL, HDL, LDLCALC, TRIG, CHOLHDL, LDLDIRECT in the last 72 hours. Thyroid Function Tests: No results for input(s): TSH, T4TOTAL, FREET4, T3FREE, THYROIDAB in the last 72 hours. Anemia Panel: No results for input(s): VITAMINB12, FOLATE, FERRITIN, TIBC, IRON, RETICCTPCT in the last 72 hours. Urine analysis:  Component Value Date/Time   COLORURINE YELLOW 10/05/2016 1448   APPEARANCEUR HAZY (A) 10/05/2016 1448   APPEARANCEUR Clear 10/21/2012 1333   LABSPEC 1.020 10/05/2016 1448   LABSPEC 1.006 10/21/2012 1333   PHURINE 6.0 10/05/2016 1448   GLUCOSEU 100 (A) 10/05/2016 1448   GLUCOSEU 50 mg/dL 10/21/2012 1333   HGBUR MODERATE (A) 10/05/2016 1448   BILIRUBINUR NEGATIVE 10/05/2016 1448   BILIRUBINUR Negative 10/21/2012 1333   KETONESUR >80 (A) 10/05/2016 1448   PROTEINUR 100 (A) 10/05/2016 1448   NITRITE NEGATIVE 10/05/2016 1448   LEUKOCYTESUR SMALL (A) 10/05/2016 1448   LEUKOCYTESUR Negative 10/21/2012 1333   Sepsis Labs: @LABRCNTIP (procalcitonin:4,lacticidven:4)  )No results found for this or any previous visit (from the past 240 hour(s)).       Radiology Studies: No results found.      Scheduled Meds: . apixaban  2.5 mg Oral BID  . feeding supplement (ENSURE ENLIVE)  237 mL Oral TID BM  . insulin aspart  0-9 Units Subcutaneous TID WC   Continuous Infusions:   LOS: 29 days    Time spent: 15 minutes.      Edwin Dada, MD [facility]   If 7PM-7AM, please contact night-coverage [email or web address] Password [email or web address] 11/02/2016, 10:01 AM

## 2016-11-03 NOTE — Progress Notes (Signed)
Pt discharged via ptar in stable condition

## 2016-11-03 NOTE — Clinical Social Work Note (Signed)
Clinical Social Worker facilitated patient discharge including contacting patient and facility to confirm patient discharge plans.  Clinical information faxed to facility and patient agreeable with plan.  CSW supervisor completing admission paperwork and has court date for guardianship.  CSW arranged ambulance transport via [facility] to [facility].  RN to call report prior to discharge [phone] hall for report).  Clinical Social Worker will sign off for now as social work intervention is no longer needed. Please consult [place] again if new need arises.  Barbette Or, [place]

## 2016-11-03 NOTE — Progress Notes (Signed)
Contacted receiving facility and updated them on pt's last bowel movement. They requested documentation showing last BM and this information will be faxed to them by social worker

## 2016-11-03 NOTE — Progress Notes (Signed)
Advised by Education officer, museum to inform PT[facility] to leave , pt not discharged at this time

## 2016-11-03 NOTE — Clinical Social Work Placement (Signed)
   CLINICAL SOCIAL WORK PLACEMENT  NOTE  Date:  11/03/2016  Patient Details  Name: Sherry [name] MRN: [id number] Date of Birth: August 27, 1927  Clinical Social Work is seeking post-discharge placement for this patient at the [place] level of care (*CSW will initial, date and re-position this form in  chart as items are completed):  Yes   Patient/family provided with [place] Work Department's list of facilities offering this level of care within the geographic area requested by the patient (or if unable, by the patient's family).  Yes   Patient/family informed of their freedom to choose among providers that offer the needed level of care, that participate in Medicare, Medicaid or managed care program needed by the patient, have an available bed and are willing to accept the patient.  Yes   Patient/family informed of [facility] ownership interest in [facility] and [facility], as well as of the fact that they are under no obligation to receive care at these facilities.  PASRR submitted to EDS on       PASRR number received on       Existing PASRR number confirmed on       FL2 transmitted to all facilities in geographic area requested by pt/family on       FL2 transmitted to all facilities within larger geographic area on       Patient informed that his/her managed care company has contracts with or will negotiate with certain facilities, including the following:        Yes   Patient/family informed of bed offers received.  Patient chooses bed at  ([facility])     Physician recommends and patient chooses bed at      Patient to be transferred to  ([facility]) on 11/02/16.  Patient to be transferred to facility by [facility]     Patient family notified on 11/02/16 of transfer.  Name of family member notified:        PHYSICIAN Please prepare prescriptions     Additional Comment:   Barbette Or,  [place]

## 2016-11-03 NOTE — Progress Notes (Signed)
Called report to receiving facility but it appears facility have some concerns about receiving pt. MD and social worker notified and waiting for response. [facility] already on unit waiting to transport pt

## 2016-11-20 ENCOUNTER — Encounter (HOSPITAL_COMMUNITY): Payer: Self-pay | Admitting: *Deleted

## 2016-11-20 ENCOUNTER — Inpatient Hospital Stay (HOSPITAL_COMMUNITY): Payer: Medicare Other

## 2016-11-20 ENCOUNTER — Inpatient Hospital Stay (HOSPITAL_COMMUNITY)
Admission: EM | Admit: 2016-11-20 | Discharge: 2016-11-21 | DRG: 872 | Disposition: A | Discharge status: Skilled Nursing Facility | Payer: Medicare Other | Attending: Internal Medicine | Admitting: Internal Medicine

## 2016-11-20 DIAGNOSIS — N183 Chronic kidney disease, stage 3 unspecified: Secondary | ICD-10-CM | POA: Diagnosis present

## 2016-11-20 DIAGNOSIS — E872 Acidosis, unspecified: Secondary | ICD-10-CM

## 2016-11-20 DIAGNOSIS — N390 Urinary tract infection, site not specified: Secondary | ICD-10-CM | POA: Diagnosis not present

## 2016-11-20 DIAGNOSIS — E785 Hyperlipidemia, unspecified: Secondary | ICD-10-CM | POA: Diagnosis present

## 2016-11-20 DIAGNOSIS — Z833 Family history of diabetes mellitus: Secondary | ICD-10-CM

## 2016-11-20 DIAGNOSIS — K5904 Chronic idiopathic constipation: Secondary | ICD-10-CM | POA: Diagnosis not present

## 2016-11-20 DIAGNOSIS — Z681 Body mass index (BMI) 19 or less, adult: Secondary | ICD-10-CM | POA: Diagnosis not present

## 2016-11-20 DIAGNOSIS — K219 Gastro-esophageal reflux disease without esophagitis: Secondary | ICD-10-CM

## 2016-11-20 DIAGNOSIS — E119 Type 2 diabetes mellitus without complications: Secondary | ICD-10-CM | POA: Diagnosis not present

## 2016-11-20 DIAGNOSIS — E1122 Type 2 diabetes mellitus with diabetic chronic kidney disease: Secondary | ICD-10-CM | POA: Diagnosis not present

## 2016-11-20 DIAGNOSIS — Z7901 Long term (current) use of anticoagulants: Secondary | ICD-10-CM

## 2016-11-20 DIAGNOSIS — D631 Anemia in chronic kidney disease: Secondary | ICD-10-CM | POA: Diagnosis not present

## 2016-11-20 DIAGNOSIS — N179 Acute kidney failure, unspecified: Secondary | ICD-10-CM

## 2016-11-20 DIAGNOSIS — I130 Hypertensive heart and chronic kidney disease with heart failure and stage 1 through stage 4 chronic kidney disease, or unspecified chronic kidney disease: Secondary | ICD-10-CM | POA: Diagnosis not present

## 2016-11-20 DIAGNOSIS — Z794 Long term (current) use of insulin: Secondary | ICD-10-CM

## 2016-11-20 DIAGNOSIS — I4891 Unspecified atrial fibrillation: Secondary | ICD-10-CM | POA: Diagnosis not present

## 2016-11-20 DIAGNOSIS — F419 Anxiety disorder, unspecified: Secondary | ICD-10-CM | POA: Diagnosis present

## 2016-11-20 DIAGNOSIS — M1990 Unspecified osteoarthritis, unspecified site: Secondary | ICD-10-CM | POA: Diagnosis not present

## 2016-11-20 DIAGNOSIS — Z66 Do not resuscitate: Secondary | ICD-10-CM | POA: Diagnosis not present

## 2016-11-20 DIAGNOSIS — A419 Sepsis, unspecified organism: Secondary | ICD-10-CM | POA: Diagnosis not present

## 2016-11-20 DIAGNOSIS — E870 Hyperosmolality and hypernatremia: Secondary | ICD-10-CM | POA: Diagnosis present

## 2016-11-20 DIAGNOSIS — F0390 Unspecified dementia without behavioral disturbance: Secondary | ICD-10-CM | POA: Diagnosis present

## 2016-11-20 DIAGNOSIS — F329 Major depressive disorder, single episode, unspecified: Secondary | ICD-10-CM | POA: Diagnosis present

## 2016-11-20 DIAGNOSIS — Z809 Family history of malignant neoplasm, unspecified: Secondary | ICD-10-CM

## 2016-11-20 DIAGNOSIS — I5022 Chronic systolic (congestive) heart failure: Secondary | ICD-10-CM | POA: Diagnosis present

## 2016-11-20 DIAGNOSIS — E860 Dehydration: Secondary | ICD-10-CM | POA: Diagnosis present

## 2016-11-20 DIAGNOSIS — I2699 Other pulmonary embolism without acute cor pulmonale: Secondary | ICD-10-CM | POA: Diagnosis present

## 2016-11-20 DIAGNOSIS — R64 Cachexia: Secondary | ICD-10-CM | POA: Diagnosis not present

## 2016-11-20 DIAGNOSIS — R6520 Severe sepsis without septic shock: Secondary | ICD-10-CM | POA: Diagnosis present

## 2016-11-20 DIAGNOSIS — Z79899 Other long term (current) drug therapy: Secondary | ICD-10-CM

## 2016-11-20 DIAGNOSIS — R112 Nausea with vomiting, unspecified: Secondary | ICD-10-CM | POA: Diagnosis present

## 2016-11-20 DIAGNOSIS — R627 Adult failure to thrive: Secondary | ICD-10-CM | POA: Diagnosis not present

## 2016-11-20 DIAGNOSIS — K449 Diaphragmatic hernia without obstruction or gangrene: Secondary | ICD-10-CM | POA: Diagnosis present

## 2016-11-20 DIAGNOSIS — R680 Hypothermia, not associated with low environmental temperature: Secondary | ICD-10-CM | POA: Diagnosis present

## 2016-11-20 DIAGNOSIS — D649 Anemia, unspecified: Secondary | ICD-10-CM | POA: Diagnosis present

## 2016-11-20 DIAGNOSIS — Z86711 Personal history of pulmonary embolism: Secondary | ICD-10-CM

## 2016-11-20 LAB — CBC
HCT: 41.5 % (ref 36.0–46.0)
Hemoglobin: 12.7 g/dL (ref 12.0–15.0)
MCH: 28 pg (ref 26.0–34.0)
MCHC: 30.6 g/dL (ref 30.0–36.0)
MCV: 91.4 fL (ref 78.0–100.0)
PLATELETS: 242 10*3/uL (ref 150–400)
RBC: 4.54 MIL/uL (ref 3.87–5.11)
RDW: 17.7 % — AB (ref 11.5–15.5)
WBC: 10.6 10*3/uL — AB (ref 4.0–10.5)

## 2016-11-20 LAB — URINALYSIS, ROUTINE W REFLEX MICROSCOPIC
BILIRUBIN URINE: NEGATIVE
GLUCOSE, UA: NEGATIVE mg/dL
Ketones, ur: 20 mg/dL — AB
NITRITE: NEGATIVE
PH: 9 — AB (ref 5.0–8.0)
SPECIFIC GRAVITY, URINE: 1.014 (ref 1.005–1.030)
SQUAMOUS EPITHELIAL / LPF: NONE SEEN

## 2016-11-20 LAB — COMPREHENSIVE METABOLIC PANEL
ALBUMIN: 1.1 g/dL — AB (ref 3.5–5.0)
ALT: 6 U/L — ABNORMAL LOW (ref 14–54)
AST: 18 U/L (ref 15–41)
Alkaline Phosphatase: 34 U/L — ABNORMAL LOW (ref 38–126)
Anion gap: 18 — ABNORMAL HIGH (ref 5–15)
BUN: 84 mg/dL — AB (ref 6–20)
CHLORIDE: 121 mmol/L — AB (ref 101–111)
CO2: 21 mmol/L — AB (ref 22–32)
CREATININE: 3.41 mg/dL — AB (ref 0.44–1.00)
Calcium: 7.5 mg/dL — ABNORMAL LOW (ref 8.9–10.3)
GFR calc Af Amer: 13 mL/min — ABNORMAL LOW (ref >60)
GFR calc non Af Amer: 11 mL/min — ABNORMAL LOW (ref >60)
Glucose, Bld: 387 mg/dL — ABNORMAL HIGH (ref 65–99)
POTASSIUM: 3.8 mmol/L (ref 3.5–5.1)
SODIUM: 160 mmol/L — AB (ref 135–145)
Total Bilirubin: 1.8 mg/dL — ABNORMAL HIGH (ref 0.3–1.2)
Total Protein: <3 g/dL — ABNORMAL LOW (ref 6.5–8.1)

## 2016-11-20 LAB — BASIC METABOLIC PANEL
Anion gap: 24 — ABNORMAL HIGH (ref 5–15)
BUN: 78 mg/dL — AB (ref 6–20)
CO2: 20 mmol/L — ABNORMAL LOW (ref 22–32)
Calcium: 8.6 mg/dL — ABNORMAL LOW (ref 8.9–10.3)
Chloride: 115 mmol/L — ABNORMAL HIGH (ref 101–111)
Creatinine, Ser: 3.45 mg/dL — ABNORMAL HIGH (ref 0.44–1.00)
GFR calc Af Amer: 13 mL/min — ABNORMAL LOW (ref >60)
GFR, EST NON AFRICAN AMERICAN: 11 mL/min — AB (ref >60)
GLUCOSE: 461 mg/dL — AB (ref 65–99)
Potassium: 3.4 mmol/L — ABNORMAL LOW (ref 3.5–5.1)
SODIUM: 159 mmol/L — AB (ref 135–145)

## 2016-11-20 LAB — CBG MONITORING, ED
GLUCOSE-CAPILLARY: 384 mg/dL — AB (ref 65–99)
Glucose-Capillary: 336 mg/dL — ABNORMAL HIGH (ref 65–99)

## 2016-11-20 MED ORDER — ONDANSETRON HCL 4 MG/2ML IJ SOLN: 4.0000 mg | Freq: Four times a day (QID) | INTRAMUSCULAR | Status: DC | PRN

## 2016-11-20 MED ORDER — SODIUM CHLORIDE 0.9 % IV BOLUS (SEPSIS)
1000.0000 mL | Freq: Once | INTRAVENOUS | Status: AC
  Administered 2016-11-20: 1000 mL via INTRAVENOUS

## 2016-11-20 MED ORDER — ACETAMINOPHEN 325 MG PO TABS: 650.0000 mg | ORAL_TABLET | Freq: Four times a day (QID) | ORAL | Status: DC | PRN

## 2016-11-20 MED ORDER — ONDANSETRON HCL 4 MG PO TABS: 4.0000 mg | ORAL_TABLET | Freq: Four times a day (QID) | ORAL | Status: DC | PRN

## 2016-11-20 MED ORDER — ENSURE ENLIVE PO LIQD: 237.0000 mL | Freq: Three times a day (TID) | ORAL | Status: DC

## 2016-11-20 MED ORDER — DEXTROSE 5 % IV SOLN
500.0000 mg | INTRAVENOUS | Status: DC
  Administered 2016-11-21: 500 mg via INTRAVENOUS
  Filled 2016-11-20 (×2): qty 0.5

## 2016-11-20 MED ORDER — BISACODYL 5 MG PO TBEC: 10.0000 mg | DELAYED_RELEASE_TABLET | Freq: Every day | ORAL | Status: DC | PRN

## 2016-11-20 MED ORDER — BISACODYL 10 MG RE SUPP: 10.0000 mg | RECTAL | Status: DC | PRN

## 2016-11-20 MED ORDER — SODIUM CHLORIDE 0.9 % IV SOLN
1.0000 g | Freq: Once | INTRAVENOUS | Status: AC
  Administered 2016-11-21: 1 g via INTRAVENOUS
  Filled 2016-11-20: qty 10

## 2016-11-20 MED ORDER — SODIUM CHLORIDE 0.45 % IV SOLN
INTRAVENOUS | Status: DC
  Administered 2016-11-21: 03:00:00 via INTRAVENOUS

## 2016-11-20 MED ORDER — SODIUM CHLORIDE 0.9 % IV SOLN
INTRAVENOUS | Status: DC
  Administered 2016-11-20: 16:00:00 via INTRAVENOUS

## 2016-11-20 MED ORDER — INSULIN ASPART 100 UNIT/ML ~~LOC~~ SOLN
0.0000 [IU] | Freq: Three times a day (TID) | SUBCUTANEOUS | Status: DC
  Administered 2016-11-20: 9 [IU] via SUBCUTANEOUS
  Administered 2016-11-21: 3 [IU] via SUBCUTANEOUS
  Filled 2016-11-20 (×2): qty 1

## 2016-11-20 MED ORDER — METOCLOPRAMIDE HCL 10 MG PO TABS: 5.0000 mg | ORAL_TABLET | Freq: Two times a day (BID) | ORAL | Status: DC

## 2016-11-20 MED ORDER — ACETAMINOPHEN 650 MG RE SUPP: 650.0000 mg | Freq: Four times a day (QID) | RECTAL | Status: DC | PRN

## 2016-11-20 MED ORDER — INSULIN ASPART 100 UNIT/ML ~~LOC~~ SOLN: 10.0000 [IU] | Freq: Once | SUBCUTANEOUS | Status: DC

## 2016-11-20 MED ORDER — APIXABAN 2.5 MG PO TABS
2.5000 mg | ORAL_TABLET | Freq: Two times a day (BID) | ORAL | Status: DC
  Filled 2016-11-20 (×2): qty 1

## 2016-11-20 MED ORDER — CEFTRIAXONE SODIUM 1 G IJ SOLR
1.0000 g | INTRAMUSCULAR | Status: DC
  Administered 2016-11-20: 1 g via INTRAVENOUS
  Filled 2016-11-20: qty 10

## 2016-11-20 MED ORDER — INSULIN ASPART 100 UNIT/ML ~~LOC~~ SOLN
10.0000 [IU] | Freq: Once | SUBCUTANEOUS | Status: AC
  Administered 2016-11-20: 10 [IU] via INTRAVENOUS
  Filled 2016-11-20: qty 1

## 2016-11-20 MED ORDER — PANTOPRAZOLE SODIUM 40 MG PO TBEC: 40.0000 mg | DELAYED_RELEASE_TABLET | Freq: Two times a day (BID) | ORAL | Status: DC

## 2016-11-20 NOTE — ED Notes (Signed)
While rounding, pt found trying to gag herself to vomit. Small amounts of brown vomitus cleaned off of face, hands, and bedsheets. Pt self adjusts in bed, resting comfortably.

## 2016-11-20 NOTE — ED Notes (Signed)
IV team consulted for blood draw and iv start

## 2016-11-20 NOTE — H&P (Signed)
History and Physical    Sherry [name]:[id number] DOB: 1927/05/17 DOA: 11/20/2016   PCP: [name], MD   Patient coming from:  Home    Chief Complaint:   HPI: Sherry [name] is a 83 y.o. female nursing home resident, with medical history significant for CKD, likely renal cell carcinoma, history of diabetes, hypertension, history of PE, history of dementia, and recent hospitalization about 1 month ago for pneumonia, with no family, whose power of attorney is the state, brought from the nursing home, due to failure to thrive.  At the nursing home, the patient has been refusing medications, food and talking to anybody.   confusion is reported.  She has been having episodes of vomiting.  Level 5 caveat applies secondary to the patient's altered mental status, and inability to follow commands at this time.   ED Course:  BP 119/76   Pulse (!) 109   Temp (!) 95 F (35 C) (Oral)   Resp 19   SpO2 100%   White count 10.6, sodium 159, bicarb 20, potassium 3.4, chloride 115, BUN 78, creatinine 3.45, calcium 8.6, glucose 461   Review of Systems: Unable to obtain due to level 5 caveat.  Past Medical History:  Diagnosis Date  . Anxiety   . Arthritis   . Chronic kidney disease   . Diabetes mellitus without complication ([place])   . Dizziness   . Dyspnea   . GERD (gastroesophageal reflux disease)   . Hyperlipidemia   . Hypertension   . Pulmonary embolism ([place]) 2016    Past Surgical History:  Procedure Laterality Date  . APPENDECTOMY    . BACK SURGERY    . SHOULDER SURGERY Left     Social History Social History   Socioeconomic History  . Marital status: Single    Spouse name: Not on file  . Number of children: Not on file  . Years of education: Not on file  . Highest education level: Not on file  Social Needs  . Financial resource strain: Not on file  . Food insecurity - worry: Not on file  . Food insecurity - inability: Not on file  . Transportation needs - medical: Not  on file  . Transportation needs - non-medical: Not on file  Occupational History  . Not on file  Tobacco Use  . Smoking status: Never Smoker  . Smokeless tobacco: Never Used  Substance and Sexual Activity  . Alcohol use: No  . Drug use: No  . Sexual activity: No  Other Topics Concern  . Not on file  Social History Narrative  . Not on file     No Known Allergies  Family History  Problem Relation Age of Onset  . Diabetes Mother   . Cancer Mother   . Cancer Father   . Bladder Cancer Neg Hx   . Kidney cancer Neg Hx       Prior to Admission medications   Medication Sig Start Date End Date Taking? Authorizing Provider  apixaban (ELIQUIS) 2.5 MG TABS tablet Take 1 tablet (2.5 mg total) by mouth 2 (two) times daily. 07/23/16  Yes Gladstone Lighter, MD  bisacodyl (BISACODYL) 5 MG EC tablet Take 10 mg daily as needed by mouth for moderate constipation.   Yes [provider]  bisacodyl (DULCOLAX) 10 MG suppository Place 10 mg as needed rectally for moderate constipation.   Yes [provider]  feeding supplement, ENSURE ENLIVE, (ENSURE ENLIVE) LIQD Take 237 mLs by mouth 3 (three)  times daily between meals. 10/30/16  Yes Isaac Bliss, Rayford Halsted, MD  metoCLOPramide (REGLAN) 5 MG tablet Take 1 tablet (5 mg total) by mouth 2 (two) times daily. 06/02/16  Yes Lorella Nimrod, MD  ondansetron (ZOFRAN) 4 MG tablet Take 4 mg every 6 (six) hours as needed by mouth for nausea or vomiting.   Yes [provider]  pantoprazole (PROTONIX) 40 MG tablet Take 1 tablet (40 mg total) by mouth 2 (two) times daily. 04/27/16  Yes Wieting, Richard, MD  polyethylene glycol [facility]) packet Take 17 g by mouth daily. 08/02/16  Yes Gladstone Lighter, MD  atorvastatin (LIPITOR) 20 MG tablet Take 20 mg by mouth at bedtime.     [provider]  docusate sodium (COLACE) 100 MG capsule Take 1 capsule (100 mg total) by mouth 2 (two) times daily. Patient not taking: Reported on  11/20/2016 08/02/16   Gladstone Lighter, MD  ferrous sulfate 325 (65 FE) MG tablet Take 1 tablet (325 mg total) by mouth daily with breakfast. Patient not taking: Reported on 11/20/2016 06/03/16   Lorella Nimrod, MD  magnesium oxide (MAG-OX) 400 (241.3 Mg) MG tablet Take 1 tablet (400 mg total) by mouth daily. Patient not taking: Reported on 11/20/2016 04/27/16   Loletha Grayer, MD  megestrol (MEGACE) 40 MG tablet Take 1 tablet (40 mg total) by mouth daily. Patient not taking: Reported on 11/20/2016 08/02/16   Gladstone Lighter, MD  Multiple Vitamin (MULTIVITAMIN WITH MINERALS) TABS tablet Take 1 tablet by mouth daily. Patient not taking: Reported on 11/20/2016 06/02/16   Shela Leff, MD    Physical Exam:  Vitals:   11/20/16 1600 11/20/16 1630 11/20/16 1700 11/20/16 1730  BP: 92/65 133/64 133/65 119/76  Pulse: (!) 102 (!) 107 (!) 109 (!) 109  Resp: (!) 26 19 19 19   Temp:      TempSrc:      SpO2: 100% 100% 100% 100%   Constitutional: NAD,  Eyes: PERRL, lids and conjunctivae normal  ENMT: Mucous membranes are dry, without exudate or lesions . Edentulous  Neck: normal, supple, no masses, no thyromegaly Respiratory: clear to auscultation bilaterally, no wheezing, no crackles. Normal respiratory effort  Cardiovascular: Tachycardic, no murmur, rubs or gallops. No extremity edema. 2+ pedal pulses. No carotid bruits.  Abdomen: Soft, non tender, no apparent suprapubic tenderness no hepatosplenomegaly. Bowel sounds positive.  Musculoskeletal: no clubbing / cyanosis. Moves all extremities Skin: no jaundice, No lesions.  Neurologic: Sensation intact unable to test her strength.     Labs on Admission: I have personally reviewed following labs and imaging studies  CBC: Recent Labs  Lab 11/20/16 1500  WBC 10.6*  HGB 12.7  HCT 41.5  MCV 91.4  PLT 448    Basic Metabolic Panel: Recent Labs  Lab 11/20/16 1500  NA 159*  K 3.4*  CL 115*  CO2 20*  GLUCOSE 461*  BUN 78*    CREATININE 3.45*  CALCIUM 8.6*    GFR: CrCl cannot be calculated (Unknown ideal weight.).  Liver Function Tests: No results for input(s): AST, ALT, ALKPHOS, BILITOT, PROT, ALBUMIN in the last 168 hours. No results for input(s): LIPASE, AMYLASE in the last 168 hours. No results for input(s): AMMONIA in the last 168 hours.  Coagulation Profile: No results for input(s): INR, PROTIME in the last 168 hours.  Cardiac Enzymes: No results for input(s): CKTOTAL, CKMB, CKMBINDEX, TROPONINI in the last 168 hours.  BNP (last 3 results) No results for input(s): PROBNP in the last 8760 hours.  HbA1C:  No results for input(s): HGBA1C in the last 72 hours.  CBG: Recent Labs  Lab 11/20/16 1528  GLUCAP 384*    Lipid Profile: No results for input(s): CHOL, HDL, LDLCALC, TRIG, CHOLHDL, LDLDIRECT in the last 72 hours.  Thyroid Function Tests: No results for input(s): TSH, T4TOTAL, FREET4, T3FREE, THYROIDAB in the last 72 hours.  Anemia Panel: No results for input(s): VITAMINB12, FOLATE, FERRITIN, TIBC, IRON, RETICCTPCT in the last 72 hours.  Urine analysis:    Component Value Date/Time   COLORURINE AMBER (A) 11/20/2016 1554   APPEARANCEUR CLOUDY (A) 11/20/2016 1554   APPEARANCEUR Clear 10/21/2012 1333   LABSPEC 1.014 11/20/2016 1554   LABSPEC 1.006 10/21/2012 1333   PHURINE 9.0 (H) 11/20/2016 1554   GLUCOSEU NEGATIVE 11/20/2016 1554   GLUCOSEU 50 mg/dL 10/21/2012 1333   HGBUR MODERATE (A) 11/20/2016 1554   BILIRUBINUR NEGATIVE 11/20/2016 1554   BILIRUBINUR Negative 10/21/2012 1333   KETONESUR 20 (A) 11/20/2016 1554   PROTEINUR >=300 (A) 11/20/2016 1554   NITRITE NEGATIVE 11/20/2016 1554   LEUKOCYTESUR LARGE (A) 11/20/2016 1554   LEUKOCYTESUR Negative 10/21/2012 1333    Sepsis Labs: @LABRCNTIP (procalcitonin:4,lacticidven:4) )No results found for this or any previous visit (from the past 240 hour(s)).   Radiological Exams on Admission: No results found.  EKG:  Independently reviewed.  Assessment/Plan Active Problems:   UTI (urinary tract infection)   Adult failure to thrive syndrome   PE (pulmonary thromboembolism) (HCC)   Diabetes mellitus type 2, insulin dependent (HCC)   Anemia   Chronic kidney disease (CKD), stage III (moderate) (HCC)   Hiatal hernia with gastroesophageal reflux disease without esophagitis   Atrial fibrillation with RVR (HCC)   Chronic idiopathic constipation   Nausea and vomiting   White count 10.6, sodium 159, bicarb 20, potassium 3.4, chloride 115, BUN 78, creatinine 3.45, calcium 8.6, glucose 461 Sepsis secondary to UTI / Hypothermia    Sepsis likely due to urine source Patient meets criteria given tachycardia, tachypnea,hypothermia, mild  leukocytosis, and evidence of organ dysfunction, confusion .  Antibiotics delivered in the ED with Rocephin . UA  + Lg leukocyte  Initial2.2 Received close to 2 L    Admit to SDU Sepsis order set  IV antibiotics by pharmacy with Cefepime per protocol   Follow lactic acid q 3  hrs Follow blood and urine cultures IV fluids      Failure to thrive, with nausea and vomiting in a patient with diagnosis of UTI, and metabolic abnormalities- see below.  IV Fluids  PT/OT  WIll consider nutrition consult Antiemetics    Hypernatremia likely due to dehydration, initially at 159, woth BUN 78   Received closed to 2 L IVF, currently at 250 cc/h Repeat BMET now , and every 6 h   Type II Diabetes Current blood sugar level is 461  Lab Results  Component Value Date   HGBA1C 5.7 (H) 10/06/2016   SSI  Atrial Fibrillation  on anticoagulation with Eliquis   Rate controlled Continue ELiquis   GERD, no acute symptoms Continue PPI  Chronic constipation Continue laxatives  Acute on Chronic kidney disease stage    baseline creatinine 1,4     Current Cr 3.45  Lab Results  Component Value Date   CREATININE 3.45 (H) 11/20/2016   CREATININE 1.24 (H) 10/29/2016   CREATININE 1.26 (H)  10/28/2016  IVF Hold diuretics and ACEI Repeat CMET   Anemia of chronic disease Hemoglobin on admission 12.7 at baseline   Repeat CBC in am  No  transfusion is indicated at this time   Trying to contact her social Forensic scientist at [facility], Orpah Clinton, who accepts her power of attorney, as the perforation does not have any family, and she is ordered of the state.  Also, Gabriel Rung, who appears to have the same title, was left a message, as we were not able to reach to her either.  Spoke with her nurse at [facility] home, who confirmed that those are her responsible parties as the patient does not have any family.  She confirmed that the patient is DNR, however, due to inability to reach, will admit him proceed with full care until able to discuss any further plans with those parties.   DVT prophylaxis:  ELiquis Code Status:    DNR  Family Communication:  Discussed with patient Disposition Plan: Expect patient to be discharged to home after condition improves Consults called:    NOne  Admission status:    Sharene Butters, PA-C [facility]   11/20/2016, 5:59 PM

## 2016-11-20 NOTE — ED Notes (Signed)
PT is difficult stick , unable to obtain iv access or draw blood work at this time.

## 2016-11-20 NOTE — ED Notes (Signed)
Pt placed on purewick, light dimmed for comfort

## 2016-11-20 NOTE — ED Triage Notes (Signed)
Pt in via transport service, per Isla Pence report was called from [facility], pt sent here for not eating and for emesis, pt non verbal upon arrival to ED, pt alert

## 2016-11-20 NOTE — ED Notes (Signed)
Unable to collect labs at this time, pt is difficult stick

## 2016-11-20 NOTE — Progress Notes (Signed)
Pharmacy Antibiotic Note  Sherry [name] is a 83 y.o. female admitted on 11/20/2016 with UTI.  Pharmacy has been consulted for cefepime dosing. Pt is hypothermic, WBC is mildly elevated at 10.6 and SCr is elevated at 3.45 (baseline ~1.2).   Plan: Cefepime 500mg  IV Q24H F/u renal fxn, C&S, clinical status   Height: 5\' 3"  (160 cm) Weight: 107 lb 5.8 oz (48.7 kg) IBW/kg (Calculated) : 52.4  Temp (24hrs), Avg:95 F (35 C), Min:95 F (35 C), Max:95 F (35 C)  Recent Labs  Lab 11/20/16 1500  WBC 10.6*  CREATININE 3.45*    Estimated Creatinine Clearance: 8.8 mL/min (A) (by C-G formula based on SCr of 3.45 mg/dL (H)).    No Known Allergies  Antimicrobials this admission: Cefepime 11/12>> CTX x 1 11/12  Dose adjustments this admission: N/A  Microbiology results: Pending  Thank you for allowing pharmacy to be a part of this patient's care.  Tucker Steedley, Rande Lawman 11/20/2016 7:01 PM

## 2016-11-20 NOTE — ED Notes (Signed)
Pt oral 95 temp, acuity increased, Charge RN aware of need for a room

## 2016-11-20 NOTE — ED Notes (Signed)
Xray bedside.

## 2016-11-20 NOTE — ED Provider Notes (Signed)
[facility] EMERGENCY DEPARTMENT Provider Note   CSN: [id number] Arrival date & time: 11/20/16  1203     History   Chief Complaint Chief Complaint  Patient presents with  . Eating Disorder    HPI Sherry [name] is a 83 y.o. female.  HPI  The patient is an 83 year old female, she has a known history of chronic kidney disease and a likely renal cell carcinoma, history of diabetes, hypertension, pulmonary embolism and history of dementia which unfortunately was exacerbated significantly during her recent hospitalization where she was inpatient for over 1 month.  According to the notes that are available in the electronic medical record the patient's family was unable to be contacted and as the patient had severe failure to thrive ultimately it was recommended by both psychiatry and palliative care services that the patient be made palliative and comfortable.  The patient was unable to make these decisions as she does not have medical decision-making capacity however she continued to refuse medications, refused food and refused to talk to anybody.  She comes to the hospital today by paramedic transport after the patient has evidently persistently refused to eat, and is having persistent episodes of vomiting and is essentially having decreased interaction and levels of consciousness.  Level 5 caveat applies secondary to the patient's altered mental status  Past Medical History:  Diagnosis Date  . Anxiety   . Arthritis   . Chronic kidney disease   . Diabetes mellitus without complication ([place])   . Dizziness   . Dyspnea   . GERD (gastroesophageal reflux disease)   . Hyperlipidemia   . Hypertension   . Pulmonary embolism ([place]) 2016    Patient Active Problem List   Diagnosis Date Noted  . Pneumonia 10/05/2016  . Hematemesis without nausea   . Hypothermia 09/13/2016  . Acute encephalopathy 09/05/2016  . Hypoglycemia 09/05/2016  . Chronic systolic CHF (congestive  heart failure) ([place]) 09/05/2016  . Syncope 08/29/2016  . Chronic idiopathic constipation   . HLD (hyperlipidemia) 07/28/2016  . Fecal impaction of colon ([place]) 07/28/2016  . Fecal impaction ([place]) 07/28/2016  . Atrial fibrillation with RVR ([place]) 07/22/2016  . Dehydration   . Renal mass   . Chest pain, rule out acute myocardial infarction 07/12/2016  . Reactive depression   . Esophagitis determined by endoscopy 05/31/2016  . Hyperglycemia   . Hypokalemia   . Cystitis   . HH (hiatus hernia)   . Hiatal hernia with gastroesophageal reflux disease without esophagitis   . GI bleed 05/28/2016  . Upper GI bleeding 05/28/2016  . Pressure injury of skin 05/28/2016  . Atrial flutter ([place])   . Gastroenteritis 04/24/2016  . Aphasia 04/18/2016  . Nausea & vomiting 03/27/2016  . Advance care planning   . UTI (urinary tract infection) 03/12/2016  . Chest pain 03/09/2016  . PE (pulmonary thromboembolism) ([place]) 02/26/2016  . Diabetes mellitus type 2, insulin dependent ([place]) 02/26/2016  . Anemia 02/26/2016  . Chronic kidney disease (CKD), stage III (moderate) ([place]) 02/26/2016  . Essential hypertension 02/26/2016  . Goals of care, counseling/discussion   . Adult failure to thrive syndrome   . Palliative care by specialist   . Sepsis ([place]) 11/26/2014    Past Surgical History:  Procedure Laterality Date  . APPENDECTOMY    . BACK SURGERY    . SHOULDER SURGERY Left     OB History    No data available       Home Medications    Prior  to Admission medications   Medication Sig Start Date End Date Taking? Authorizing Provider  apixaban (ELIQUIS) 2.5 MG TABS tablet Take 1 tablet (2.5 mg total) by mouth 2 (two) times daily. 07/23/16  Yes Gladstone Lighter, MD  bisacodyl (BISACODYL) 5 MG EC tablet Take 10 mg daily as needed by mouth for moderate constipation.   Yes [provider]  bisacodyl (DULCOLAX) 10 MG suppository Place 10 mg as needed rectally for moderate constipation.   Yes  [provider]  feeding supplement, ENSURE ENLIVE, (ENSURE ENLIVE) LIQD Take 237 mLs by mouth 3 (three) times daily between meals. 10/30/16  Yes Isaac Bliss, Rayford Halsted, MD  metoCLOPramide (REGLAN) 5 MG tablet Take 1 tablet (5 mg total) by mouth 2 (two) times daily. 06/02/16  Yes Lorella Nimrod, MD  ondansetron (ZOFRAN) 4 MG tablet Take 4 mg every 6 (six) hours as needed by mouth for nausea or vomiting.   Yes [provider]  pantoprazole (PROTONIX) 40 MG tablet Take 1 tablet (40 mg total) by mouth 2 (two) times daily. 04/27/16  Yes Wieting, Richard, MD  polyethylene glycol [facility]) packet Take 17 g by mouth daily. 08/02/16  Yes Gladstone Lighter, MD  atorvastatin (LIPITOR) 20 MG tablet Take 20 mg by mouth at bedtime.     [provider]  docusate sodium (COLACE) 100 MG capsule Take 1 capsule (100 mg total) by mouth 2 (two) times daily. Patient not taking: Reported on 11/20/2016 08/02/16   Gladstone Lighter, MD  ferrous sulfate 325 (65 FE) MG tablet Take 1 tablet (325 mg total) by mouth daily with breakfast. Patient not taking: Reported on 11/20/2016 06/03/16   Lorella Nimrod, MD  magnesium oxide (MAG-OX) 400 (241.3 Mg) MG tablet Take 1 tablet (400 mg total) by mouth daily. Patient not taking: Reported on 11/20/2016 04/27/16   Loletha Grayer, MD  megestrol (MEGACE) 40 MG tablet Take 1 tablet (40 mg total) by mouth daily. Patient not taking: Reported on 11/20/2016 08/02/16   Gladstone Lighter, MD  Multiple Vitamin (MULTIVITAMIN WITH MINERALS) TABS tablet Take 1 tablet by mouth daily. Patient not taking: Reported on 11/20/2016 06/02/16   Shela Leff, MD    Family History Family History  Problem Relation Age of Onset  . Diabetes Mother   . Cancer Mother   . Cancer Father   . Bladder Cancer Neg Hx   . Kidney cancer Neg Hx     Social History Social History   Tobacco Use  . Smoking status: Never Smoker  . Smokeless tobacco: Never Used  Substance Use  Topics  . Alcohol use: No  . Drug use: No     Allergies   Patient has no known allergies.   Review of Systems Review of Systems  Unable to perform ROS: Acuity of condition     Physical Exam Updated Vital Signs BP 92/65   Pulse (!) 102   Temp (!) 95 F (35 C) (Oral)   Resp (!) 26   SpO2 100%   Physical Exam  Constitutional:  The patient is ill-appearing, she is cachectic, she is wasting  HENT:  Head: Normocephalic and atraumatic.  Mouth/Throat: No oropharyngeal exudate.  Severe wasting of the face, sunken eyes, dry mucous membranes  Eyes: Conjunctivae and EOM are normal. Pupils are equal, round, and reactive to light. Right eye exhibits no discharge. Left eye exhibits no discharge. No scleral icterus.  Neck: Normal range of motion. Neck supple. No JVD present. No thyromegaly present.  Cardiovascular: Normal heart sounds and intact  distal pulses. Exam reveals no gallop and no friction rub.  No murmur heard. Tachycardia  Pulmonary/Chest: Effort normal and breath sounds normal. No respiratory distress. She has no wheezes. She has no rales.  Abdominal: Soft. Bowel sounds are normal. She exhibits no distension and no mass. There is no tenderness.  Musculoskeletal: Normal range of motion. She exhibits no edema or tenderness.  Lymphadenopathy:    She has no cervical adenopathy.  Neurological:  The patient appears to be awake, her eyes are open, sometimes she closes them, she lays on her left side, she is able to reach with both of her arms towards the left bedrail, she tries to shift her weight to the left, she is moving both of her leg spontaneously but does not move any of her 4 extremities to command.  Skin: Skin is warm and dry. No rash noted. No erythema.  Nursing note and vitals reviewed.    ED Treatments / Results  Labs (all labs ordered are listed, but only abnormal results are displayed) Labs Reviewed  BASIC METABOLIC PANEL - Abnormal; Notable for the following  components:      Result Value   Sodium 159 (*)    Potassium 3.4 (*)    Chloride 115 (*)    CO2 20 (*)    Glucose, Bld 461 (*)    BUN 78 (*)    Creatinine, Ser 3.45 (*)    Calcium 8.6 (*)    GFR calc non Af Amer 11 (*)    GFR calc Af Amer 13 (*)    Anion gap 24 (*)    All other components within normal limits  CBC - Abnormal; Notable for the following components:   WBC 10.6 (*)    RDW 17.7 (*)    All other components within normal limits  CBG MONITORING, ED - Abnormal; Notable for the following components:   Glucose-Capillary 384 (*)    All other components within normal limits  URINE CULTURE  URINALYSIS, ROUTINE W REFLEX MICROSCOPIC    EKG  EKG Interpretation  Date/Time:  Monday November 20 2016 12:57:19 EST Ventricular Rate:  99 PR Interval:    QRS Duration: 127 QT Interval:  446 QTC Calculation: 573 R Axis:   27 Text Interpretation:  Sinus rhythm Right atrial enlargement Left bundle branch block Baseline wander in lead(s) V2 since last tracing no significant change Confirmed by Noemi Chapel [phone]) on 11/20/2016 4:17:38 PM       Radiology No results found.  Procedures Procedures (including critical care time)  Medications Ordered in ED Medications  0.9 %  sodium chloride infusion ( Intravenous New Bag/Given 11/20/16 1611)  sodium chloride 0.9 % bolus 1,000 mL (not administered)  insulin aspart (novoLOG) injection 10 Units (not administered)     Initial Impression / Assessment and Plan / ED Course  I have reviewed the triage vital signs and the nursing notes.  Pertinent labs & imaging results that were available during my care of the patient were reviewed by me and considered in my medical decision making (see chart for details).    The patient has been vomiting, she appears severely dehydrated and cachectic, I question the etiology versus failure to thrive, at this point will order some labs and try to get hold of the nursing facility, IV fluids, the  patient has DO NOT RESUSCITATE orders.  After speaking with nursing home - [facility]. They state that she has been failure to thrive - she has had no specific symptoms  other than refusing her food and water and vomiting.  The facility states that they sent her here b/c her "power of attorney is here - the state" - they request a MOST form to be filled out - for return to their facility.    I have tried to get a hold of the patient's power of attorney who is Merry Proud, the director of [employer] based on the legal paperwork accompanying the patient.  I have gotten a Advertising account executive.  We are unable to get a hold of them.  I cannot fill out the medical orders for scope of treatment form without speaking with the healthcare power of attorney thus the patient will likely need to be admitted to the hospital for treatment of her hypernatremia and acute kidney injury until this can be accomplished.  D/w hospitalist who will admit Fluids and insulin given for what appears to be severe dehydration / hyperglycemia.   Final Clinical Impressions(s) / ED Diagnoses   Final diagnoses:  Hypernatremia  AKI (acute kidney injury) ([place])  FTT (failure to thrive) in adult  Metabolic acidosis    ED Discharge Orders    None       Noemi Chapel, MD 11/20/16 [phone]

## 2016-11-21 ENCOUNTER — Other Ambulatory Visit: Payer: Self-pay

## 2016-11-21 DIAGNOSIS — N179 Acute kidney failure, unspecified: Secondary | ICD-10-CM

## 2016-11-21 DIAGNOSIS — R627 Adult failure to thrive: Secondary | ICD-10-CM

## 2016-11-21 DIAGNOSIS — Z7189 Other specified counseling: Secondary | ICD-10-CM

## 2016-11-21 DIAGNOSIS — E870 Hyperosmolality and hypernatremia: Secondary | ICD-10-CM | POA: Diagnosis not present

## 2016-11-21 DIAGNOSIS — A419 Sepsis, unspecified organism: Secondary | ICD-10-CM | POA: Diagnosis not present

## 2016-11-21 DIAGNOSIS — E872 Acidosis: Secondary | ICD-10-CM

## 2016-11-21 DIAGNOSIS — Z515 Encounter for palliative care: Secondary | ICD-10-CM

## 2016-11-21 LAB — CBC
HEMATOCRIT: 34.3 % — AB (ref 36.0–46.0)
HEMOGLOBIN: 10.6 g/dL — AB (ref 12.0–15.0)
MCH: 27.8 pg (ref 26.0–34.0)
MCHC: 30.9 g/dL (ref 30.0–36.0)
MCV: 90 fL (ref 78.0–100.0)
Platelets: 182 10*3/uL (ref 150–400)
RBC: 3.81 MIL/uL — AB (ref 3.87–5.11)
RDW: 17.9 % — ABNORMAL HIGH (ref 11.5–15.5)
WBC: 8.8 10*3/uL (ref 4.0–10.5)

## 2016-11-21 LAB — COMPREHENSIVE METABOLIC PANEL
ALBUMIN: 2.1 g/dL — AB (ref 3.5–5.0)
ALK PHOS: 65 U/L (ref 38–126)
ALT: 8 U/L — AB (ref 14–54)
ALT: 6 U/L — ABNORMAL LOW (ref 14–54)
ANION GAP: 13 (ref 5–15)
AST: 15 U/L (ref 15–41)
AST: 9 U/L — ABNORMAL LOW (ref 15–41)
Albumin: 1.9 g/dL — ABNORMAL LOW (ref 3.5–5.0)
Alkaline Phosphatase: 60 U/L (ref 38–126)
Anion gap: 14 (ref 5–15)
BILIRUBIN TOTAL: 1.5 mg/dL — AB (ref 0.3–1.2)
BUN: 72 mg/dL — ABNORMAL HIGH (ref 6–20)
BUN: 71 mg/dL — ABNORMAL HIGH (ref 6–20)
CALCIUM: 7.8 mg/dL — AB (ref 8.9–10.3)
CO2: 21 mmol/L — AB (ref 22–32)
CO2: 20 mmol/L — ABNORMAL LOW (ref 22–32)
CREATININE: 2.57 mg/dL — AB (ref 0.44–1.00)
Calcium: 7.7 mg/dL — ABNORMAL LOW (ref 8.9–10.3)
Chloride: 125 mmol/L — ABNORMAL HIGH (ref 101–111)
Chloride: 124 mmol/L — ABNORMAL HIGH (ref 101–111)
Creatinine, Ser: 2.54 mg/dL — ABNORMAL HIGH (ref 0.44–1.00)
GFR calc Af Amer: 18 mL/min — ABNORMAL LOW (ref >60)
GFR calc non Af Amer: 16 mL/min — ABNORMAL LOW (ref >60)
GFR, EST AFRICAN AMERICAN: 18 mL/min — AB (ref >60)
GFR, EST NON AFRICAN AMERICAN: 16 mL/min — AB (ref >60)
GLUCOSE: 182 mg/dL — AB (ref 65–99)
Glucose, Bld: 282 mg/dL — ABNORMAL HIGH (ref 65–99)
POTASSIUM: 2.7 mmol/L — AB (ref 3.5–5.1)
Potassium: 3.3 mmol/L — ABNORMAL LOW (ref 3.5–5.1)
SODIUM: 160 mmol/L — AB (ref 135–145)
Sodium: 157 mmol/L — ABNORMAL HIGH (ref 135–145)
TOTAL PROTEIN: 4.2 g/dL — AB (ref 6.5–8.1)
Total Bilirubin: 1.1 mg/dL (ref 0.3–1.2)
Total Protein: 4.5 g/dL — ABNORMAL LOW (ref 6.5–8.1)

## 2016-11-21 LAB — CBG MONITORING, ED
GLUCOSE-CAPILLARY: 145 mg/dL — AB (ref 65–99)
GLUCOSE-CAPILLARY: 242 mg/dL — AB (ref 65–99)

## 2016-11-21 LAB — LACTIC ACID, PLASMA: LACTIC ACID, VENOUS: 1.6 mmol/L (ref 0.5–1.9)

## 2016-11-21 LAB — HEMOGLOBIN A1C
HEMOGLOBIN A1C: 7.6 % — AB (ref 4.8–5.6)
Mean Plasma Glucose: 171.42 mg/dL

## 2016-11-21 LAB — PROCALCITONIN

## 2016-11-21 LAB — BETA-HYDROXYBUTYRIC ACID: BETA-HYDROXYBUTYRIC ACID: 3.23 mmol/L — AB (ref 0.05–0.27)

## 2016-11-21 LAB — APTT: APTT: 26 s (ref 24–36)

## 2016-11-21 MED ORDER — ATROPINE SULFATE 1 % OP SOLN
4.0000 [drp] | OPHTHALMIC | Status: DC | PRN
  Filled 2016-11-21: qty 2

## 2016-11-21 MED ORDER — POTASSIUM CHLORIDE CRYS ER 20 MEQ PO TBCR: 40.0000 meq | EXTENDED_RELEASE_TABLET | Freq: Three times a day (TID) | ORAL | Status: DC

## 2016-11-21 MED ORDER — MORPHINE SULFATE (CONCENTRATE) 10 MG/0.5ML PO SOLN: 5.0000 mg | ORAL | 0 refills | Status: AC | PRN

## 2016-11-21 MED ORDER — GLYCOPYRROLATE 0.2 MG/ML IJ SOLN
0.4000 mg | INTRAMUSCULAR | Status: AC
  Administered 2016-11-21: 0.4 mg via INTRAVENOUS
  Filled 2016-11-21: qty 2

## 2016-11-21 MED ORDER — GLYCOPYRROLATE 0.2 MG/ML IJ SOLN: 0.4000 mg | INTRAMUSCULAR | 0 refills | Status: AC | PRN

## 2016-11-21 MED ORDER — MORPHINE SULFATE (CONCENTRATE) 10 MG/0.5ML PO SOLN: 5.0000 mg | ORAL | Status: DC | PRN

## 2016-11-21 MED ORDER — POTASSIUM CHLORIDE 10 MEQ/100ML IV SOLN
10.0000 meq | INTRAVENOUS | Status: DC
  Administered 2016-11-21: 10 meq via INTRAVENOUS
  Filled 2016-11-21: qty 100

## 2016-11-21 NOTE — ED Notes (Signed)
Attending paged concerned potassium 2.7

## 2016-11-21 NOTE — Progress Notes (Addendum)
2:12pm: CSW spoke with [place] and was informed that they have no preference over whether pt is placed back at [facility] or if pt goes to a hospice home. CSW spoke with Tameika at [facility] and confirmed that pt is from the facility and they do NOT have a bed at this time for pt. CSW reached out to [place] and was informed that he would call CSW back after staffing case with Medical Director (Dr. Loni Muse).    CSW received call from [place] (previous LCSW on the case) and was informed that she has received a call from [place] ([place] persons for [facility]) mentioning that at this time they have filled pt's bed at the facility, but are trying to work something out. CSW to update Tameika about discharge plans once available to [place]. CSW continues to follow for discharge needs.   Sherry [name], MSW, [facility] Clinical Social Worker [phone]

## 2016-11-21 NOTE — ED Notes (Signed)
Attending paged regarding potassium 2.7

## 2016-11-21 NOTE — Progress Notes (Signed)
CSW received phone call from pt son Jnya Brossard asking CSW to ensure that the patient not be returned to [facility] had worked briefly on case during previous admission but had been unable to reach any family.  In previous admission CSW were assisting with APS getting guardianship since no family was involved- CSW informed ED CSW that son had made contact and ED CSW trying to speak with APS workers who reportedly are current guardians  CSW signing off  Jorge Ny, [place] Social Worker [phone]

## 2016-11-21 NOTE — Discharge Planning (Signed)
Pt to return to [facility] with comfort care.

## 2016-11-21 NOTE — ED Notes (Signed)
Pt to be discharged back to assisted living facility, PTAR called by social work

## 2016-11-21 NOTE — Progress Notes (Signed)
CSW spoke with Rush Barer at [facility]. Per Rush Barer pt is able to return to the facility today. CSW will prepare for discharge.    Virgie Dad Jezreel Justiniano, MSW, [facility] Clinical Social Worker [phone]

## 2016-11-21 NOTE — Progress Notes (Signed)
CSW has set up transport for pt back to [facility] in [place]. At this time CSW has faxed over all needed documents per Tameika's request. CSW has updated pt's legal guardian of transport at this time. RN to call report to [phone]. There are no further CSW needs at this time. CSW signing off.     Virgie Dad Muhamed Luecke, MSW, [facility] Clinical Social Worker [phone]

## 2016-11-21 NOTE — Progress Notes (Signed)
PT Cancellation Note  Patient Details Name: Sherry [name] MRN: [id number] DOB: July 27, 1927   Cancelled Treatment:    Reason Eval/Treat Not Completed: Fatigue/lethargy limiting ability to participate Pt very sleepy and not wanting to participate with PT. Will check back as schedule allows.   Leighton Ruff, PT, DPT  Acute Rehabilitation Services  Pager: [phone]    Rudean Hitt 11/21/2016, 12:37 PM

## 2016-11-21 NOTE — Consult Note (Signed)
Consultation Note Date: 11/21/2016   Patient Name: Sherry [name]  DOB: 10-02-1927  MRN: [id number]  Age / Sex: 83 y.o., female  PCP: [name], MD Referring Physician: Georgette Shell, MD  Reason for Consultation: Disposition, Establishing goals of care and Hospice Evaluation  HPI/Patient Profile: 83 y.o. female  with past medical history of CKD, likely renal cell carcinoma with renal pelvic mass, history of diabetes, hypertension, history of PE, history of dementia, and recent hospitalization about 1 month ago for pneumonia admitted on 11/20/2016 from SNF with severe dehydration, UTI sepsis, very poor oral intake, acute renal failure. Palliative consulted to assist with EOL and hospice evaluation.   Clinical Assessment and Goals of Care: I met at Sherry [name]'s bedside. She is lying in fetal position, right hand is very swollen, does not talk or communicate or follow commands. I also suctioned copious amounts of oral secretions with yankeur. She is not eating and appears to be at end of life. She has been seen multiple times by my palliative team colleagues and was recently recommended to pursue hospice care on last admission but there was no surrogate decision maker as we could not contact any family members. Sherry [name] now has legal guardian via [place].   I contacted and spoke with Virgilio Belling [phone] who agrees with pursuit of hospice care for Sherry [name]. They agree with return to SNF with hospice to follow and if no bed available to pursue hospice facility placement. I explained my assessment that Sherry [name] is at EOL and would qualify for hospice placement with likely prognosis of weeks or less. Explained our recommendation is still for hospice and comfort from our last admission. Virgilio Belling is on board for this plan of care. Contacted CSW and updated on my conversation with [name]. Will add prn  medication recommendations to ensure comfort that she may have upon discharge at facility under the guidance of hospice.    Primary Decision Maker LEGAL GUARDIAN DSS  Virgilio Belling [phone]    SUMMARY OF RECOMMENDATIONS   - Recommend comfort care guided by hospice  Code Status/Advance Care Planning:  DNR   Symptom Management:   Pain/dyspnea: Roxanol 5 mg every 2 hours prn.   Secretions: Robinul 0.4 mg IV x 1 to be given before discharge. May utilize atropine SL 4 drops every 4 hours prn.   Palliative Prophylaxis:   Aspiration, Bowel Regimen, Delirium Protocol, Frequent Pain Assessment, Oral Care and Turn Reposition  Additional Recommendations (Limitations, Scope, Preferences):  Full Comfort Care  Psycho-social/Spiritual:   Desire for further Chaplaincy support:no  Additional Recommendations: Education on Hospice  Prognosis:   < 2 weeks  Discharge Planning: [place] with Hospice      Primary Diagnoses: Present on Admission: . Nausea and vomiting . Chronic kidney disease (CKD), stage III (moderate) ([facility]) . Adult failure to thrive syndrome . PE (pulmonary thromboembolism) ([place]) . Anemia . UTI (urinary tract infection) . Reactive depression . Atrial fibrillation with RVR ([place]) . Chronic idiopathic constipation   I  have reviewed the medical record, interviewed the patient and family, and examined the patient. The following aspects are pertinent.  Past Medical History:  Diagnosis Date  . Anxiety   . Arthritis   . Chronic kidney disease   . Diabetes mellitus without complication ([place])   . Dizziness   . Dyspnea   . GERD (gastroesophageal reflux disease)   . Hyperlipidemia   . Hypertension   . Pulmonary embolism ([place]) 2016   Social History   Socioeconomic History  . Marital status: Single    Spouse name: None  . Number of children: None  . Years of education: None  . Highest education level: None  Social Needs  . Financial resource  strain: None  . Food insecurity - worry: None  . Food insecurity - inability: None  . Transportation needs - medical: None  . Transportation needs - non-medical: None  Occupational History  . None  Tobacco Use  . Smoking status: Never Smoker  . Smokeless tobacco: Never Used  Substance and Sexual Activity  . Alcohol use: No  . Drug use: No  . Sexual activity: No  Other Topics Concern  . None  Social History Narrative  . None   Family History  Problem Relation Age of Onset  . Diabetes Mother   . Cancer Mother   . Cancer Father   . Bladder Cancer Neg Hx   . Kidney cancer Neg Hx    Scheduled Meds: Continuous Infusions: . sodium chloride 100 mL/hr at 11/21/16 1055   PRN Meds:.acetaminophen **OR** acetaminophen, bisacodyl, bisacodyl, ondansetron **OR** ondansetron (ZOFRAN) IV No Known Allergies Review of Systems  Unable to perform ROS: Patient nonverbal    Physical Exam  Constitutional: She appears lethargic. She appears cachectic. She has a sickly appearance.  Elderly, frail  Cardiovascular: Normal rate and regular rhythm.  Pulmonary/Chest: No accessory muscle usage. No tachypnea. No respiratory distress. She has decreased breath sounds in the right upper field, the right middle field and the right lower field.  Copious oral secretions  Abdominal: Soft. Normal appearance. There is no tenderness.  Neurological: She appears lethargic. She is disoriented.  Nonverbal   Nursing note and vitals reviewed.   Vital Signs: BP (!) 146/57   Pulse 78   Temp (!) 97.5 F (36.4 C) (Rectal)   Resp 16   Ht _0  (1.6 m)   Wt 48.7 kg (107 lb 5.8 oz)   SpO2 100%   BMI 19.02 kg/m  Pain Assessment: Faces   Pain Score: Asleep   SpO2: SpO2: 100 % O2 Device:SpO2: 100 % O2 Flow Rate: .   IO: Intake/output summary:   Intake/Output Summary (Last 24 hours) at 11/21/2016 1450 Last data filed at 11/20/2016 1858 Gross per 24 hour  Intake 50 ml  Output -  Net 50 ml    LBM:     Baseline Weight: Weight: 48.7 kg (107 lb 5.8 oz) Most recent weight: Weight: 48.7 kg (107 lb 5.8 oz)     Palliative Assessment/Data: 20%     Time Total: 50 min  Greater than 50%  of this time was spent counseling and coordinating care related to the above assessment and plan.  Signed by: Vinie Sill, NP Palliative Medicine Team Pager # [phone] (M-F 8a-5p) Team Phone # [phone] (Nights/Weekends)

## 2016-11-21 NOTE — Progress Notes (Addendum)
11:51: CSW recived call from doctor informing CSW that pt's POA is [place] and [place] (Social Workers from [employer]). CSW reached out to [place] [phone]) to gather needed information pt. CSW was unable to speak wth either individual therefore CSW left VM asking that [place] call CSW back.    CSW attempted to speak with pt at bedside. Pt was not willing to talk at this time. CSW will check back later to speak and assess pt as needed.    Sherry [name], MSW, [place] [facility] Clinical Social Worker [phone]

## 2016-11-21 NOTE — Care Management Obs Status (Signed)
[facility] NOTIFICATION   Patient Details  Name: Sherry [name] MRN: [id number] Date of Birth: 1927-08-03   Medicare Observation Status Notification Given:  No  CM was unable to give patient Obs Letter and Condition code 64 patient is unable to comprehend. Patient is a ward of the state, ED CM and CSW attempted several times to contact Legal Guardian by phone but was unsuccessful.  Laurena Slimmer, RN 11/21/2016, 7:57 PM

## 2016-11-21 NOTE — ED Notes (Addendum)
Previous RN stated attempt to stick pt for previously due labs. Pt now noted to have a swollen and cool hand with bruising. Radial pulse palpable. Pt resting. Will continue to monitor.

## 2016-11-21 NOTE — ED Notes (Signed)
Attending paged about order for PO potassium, patient NPO waiting for speech therapy evaluation

## 2016-11-21 NOTE — ED Notes (Signed)
Pt discharged to [facility] via [facility]

## 2016-11-21 NOTE — Discharge Summary (Signed)
Physician Discharge Summary  Sherry [name] GYJ:[id number] DOB: [birth year] DOA: 11/20/2016  PCP: [name], MD  Admit date: 11/20/2016 Discharge date: 11/21/2016  Admitted From:NH Disposition:NH  Recommendations for Outpatient Follow-up:  1. Follow up with PCP in 1-2 weeks 2. HOSPICE TO FOLLOW AT THE FACILITY 3. DO NOT READMIT TO [place] [facility]Equipment/DeviceNONE Discharge Condition GUARDED :CODE STATUS:DNR Diet recommendation NPO Brief/Interim Summary:83 y.o. female nursing home resident, with medical history significant for CKD, likely renal cell carcinoma, history of diabetes, hypertension, history of PE, history of dementia, and recent hospitalization about 1 month ago for pneumonia, with no family, whose power of attorney is the state, brought from the nursing home, due to failure to thrive.  At the nursing home, the patient has been refusing medications, food and talking to anybody.   confusion is reported.  She has been having episodes of vomiting.  Level 5 caveat applies secondary to the patient's altered mental status, and inability to follow commands at this time.   ED Course:  BP 119/76   Pulse (!) 109   Temp (!) 95 F (35 C) (Oral)   Resp 19   SpO2 100%   White count 10.6, sodium 159, bicarb 20, potassium 3.4, chloride 115, BUN 78, creatinine 3.45, calcium 8.6, glucose 461     Discharge Diagnoses:  Active Problems:   Adult failure to thrive syndrome   PE (pulmonary thromboembolism) ([place])   Diabetes mellitus type 2, insulin dependent (HCC)   Anemia   Chronic kidney disease (CKD), stage III (moderate) (HCC)   UTI (urinary tract infection)   Hiatal hernia with gastroesophageal reflux disease without esophagitis   Reactive depression   Atrial fibrillation with RVR (HCC)   Chronic idiopathic constipation   Nausea and vomiting  Acute renal failure with dehydration and hypernatremia Dementia Failure to thrive Urinary tract  infection Sepsis   Plan-discussed with the POA kaylie and [place] social workers from [place].  Patient will be discharged back to the nursing home with hospice to follow and do not return to hospital.  Patient's prognosis is guarded with acute renal failure and sepsis and dementia. she is not a dialysis candidate.  Aim is to make the patient comfortable and keep her comfortable patient has been started on morphine sublingual and Robinul.  Please make sure patient will be seen by hospice once she arrives to the nursing home. Discharge Instructions   Allergies as of 11/21/2016   No Known Allergies     Medication List    STOP taking these medications   apixaban 2.5 MG Tabs tablet Commonly known as:  ELIQUIS   bisacodyl 10 MG suppository Commonly known as:  DULCOLAX   bisacodyl 5 MG EC tablet Generic drug:  bisacodyl   docusate sodium 100 MG capsule Commonly known as:  COLACE   feeding supplement (ENSURE ENLIVE) Liqd   ferrous sulfate 325 (65 FE) MG tablet   magnesium oxide 400 (241.3 Mg) MG tablet Commonly known as:  MAG-OX   megestrol 40 MG tablet Commonly known as:  MEGACE   metoCLOPramide 5 MG tablet Commonly known as:  REGLAN   multivitamin with minerals Tabs tablet   NUTRITIONAL SUPPLEMENTS PO   ondansetron 4 MG tablet Commonly known as:  ZOFRAN   pantoprazole 40 MG tablet Commonly known as:  PROTONIX   polyethylene glycol packet Commonly known as:  MIRALAX     TAKE these medications   glycopyrrolate 0.2 MG/ML injection Commonly known as:  ROBINUL Inject 2  mLs (0.4 mg total) every 4 (four) hours as needed into the vein.   morphine CONCENTRATE 10 MG/0.5ML Soln concentrated solution Take 0.25 mLs (5 mg total) every 2 (two) hours as needed by mouth for severe pain or shortness of breath.       No Known Allergies  Consultations:  NONE   Procedures/Studies: Dg Chest Port 1 View  Result Date: 11/20/2016 CLINICAL DATA:  Sepsis EXAM:  PORTABLE CHEST 1 VIEW COMPARISON:  10/10/2016 FINDINGS: Surgical hardware in the cervical spine. No consolidation or effusion. Stable cardiomediastinal silhouette with atherosclerosis. No pneumothorax. IMPRESSION: No active disease. Electronically Signed   By: Donavan Foil M.D.   On: 11/20/2016 18:53    (Echo, Carotid, EGD, Colonoscopy, ERCP)    Subjective:   Discharge Exam: Vitals:   11/21/16 1415 11/21/16 1445  BP: (!) 146/57 (!) 141/63  Pulse: 78 87  Resp: 16 14  Temp:    SpO2: 100% 100%   Vitals:   11/21/16 1300 11/21/16 1345 11/21/16 1415 11/21/16 1445  BP: (!) 104/51 129/61 (!) 146/57 (!) 141/63  Pulse: 75 79 78 87  Resp: 19 13 16 14   Temp:      TempSrc:      SpO2: 100% 100% 100% 100%  Weight:      Height:        General: Pt is alert, awake, not in acute distress Cardiovascular: RRR, S1/S2 +, no rubs, no gallops Respiratory: CTA bilaterally, no wheezing, no rhonchi Abdominal: Soft, NT, ND, bowel sounds + Extremities: no edema, no cyanosis    The results of significant diagnostics from this hospitalization (including imaging, microbiology, ancillary and laboratory) are listed below for reference.     Microbiology: Recent Results (from the past 240 hour(s))  Urine Culture     Status: Abnormal (Preliminary result)   Collection Time: 11/20/16  4:01 PM  Result Value Ref Range Status   Specimen Description URINE, CATHETERIZED  Final   Special Requests NONE  Final   Culture (A)  Final    >=100,000 COLONIES/mL PROTEUS MIRABILIS SUSCEPTIBILITIES TO FOLLOW    Report Status PENDING  Incomplete     Labs: BNP (last 3 results) Recent Labs    03/07/16 1559 08/21/16 0241 09/05/16 2002  BNP 87.0 94.0 102.7*   Basic Metabolic Panel: Recent Labs  Lab 11/20/16 1500 11/20/16 1825 11/21/16 0210 11/21/16 0550  NA 159* 160* 160* 157*  K 3.4* 3.8 3.3* 2.7*  CL 115* 121* 125* 124*  CO2 20* 21* 21* 20*  GLUCOSE 461* 387* 182* 282*  BUN 78* 84* 72* 71*   CREATININE 3.45* 3.41* 2.57* 2.54*  CALCIUM 8.6* 7.5* 7.8* 7.7*   Liver Function Tests: Recent Labs  Lab 11/20/16 1825 11/21/16 0210 11/21/16 0550  AST 18 15 9*  ALT 6* 8* 6*  ALKPHOS 34* 65 60  BILITOT 1.8* 1.1 1.5*  PROT <3.0* 4.5* 4.2*  ALBUMIN 1.1* 2.1* 1.9*   No results for input(s): LIPASE, AMYLASE in the last 168 hours. No results for input(s): AMMONIA in the last 168 hours. CBC: Recent Labs  Lab 11/20/16 1500 11/21/16 0550  WBC 10.6* 8.8  HGB 12.7 10.6*  HCT 41.5 34.3*  MCV 91.4 90.0  PLT 242 182   Cardiac Enzymes: No results for input(s): CKTOTAL, CKMB, CKMBINDEX, TROPONINI in the last 168 hours. BNP: Invalid input(s): POCBNP CBG: Recent Labs  Lab 11/20/16 1528 11/20/16 1815 11/21/16 0233 11/21/16 0812  GLUCAP 384* 336* 145* 242*   D-Dimer No results for input(s): DDIMER in  the last 72 hours. Hgb A1c Recent Labs    11/21/16 0209  HGBA1C 7.6*   Lipid Profile No results for input(s): CHOL, HDL, LDLCALC, TRIG, CHOLHDL, LDLDIRECT in the last 72 hours. Thyroid function studies No results for input(s): TSH, T4TOTAL, T3FREE, THYROIDAB in the last 72 hours.  Invalid input(s): FREET3 Anemia work up No results for input(s): VITAMINB12, FOLATE, FERRITIN, TIBC, IRON, RETICCTPCT in the last 72 hours. Urinalysis    Component Value Date/Time   COLORURINE AMBER (A) 11/20/2016 1554   APPEARANCEUR CLOUDY (A) 11/20/2016 1554   APPEARANCEUR Clear 10/21/2012 1333   LABSPEC 1.014 11/20/2016 1554   LABSPEC 1.006 10/21/2012 1333   PHURINE 9.0 (H) 11/20/2016 1554   GLUCOSEU NEGATIVE 11/20/2016 1554   GLUCOSEU 50 mg/dL 10/21/2012 1333   HGBUR MODERATE (A) 11/20/2016 1554   BILIRUBINUR NEGATIVE 11/20/2016 1554   BILIRUBINUR Negative 10/21/2012 1333   KETONESUR 20 (A) 11/20/2016 1554   PROTEINUR >=300 (A) 11/20/2016 1554   NITRITE NEGATIVE 11/20/2016 1554   LEUKOCYTESUR LARGE (A) 11/20/2016 1554   LEUKOCYTESUR Negative 10/21/2012 1333   Sepsis  Labs Invalid input(s): PROCALCITONIN,  WBC,  LACTICIDVEN Microbiology Recent Results (from the past 240 hour(s))  Urine Culture     Status: Abnormal (Preliminary result)   Collection Time: 11/20/16  4:01 PM  Result Value Ref Range Status   Specimen Description URINE, CATHETERIZED  Final   Special Requests NONE  Final   Culture (A)  Final    >=100,000 COLONIES/mL PROTEUS MIRABILIS SUSCEPTIBILITIES TO FOLLOW    Report Status PENDING  Incomplete     Time coordinating discharge: Over 30 minutes  SIGNED:   Georgette Shell, MD  [facility] 11/21/2016, 3:16 PM Pager   If 7PM-7AM, please contact night-coverage [email or web address] Password [email or web address]

## 2016-11-22 DIAGNOSIS — Z515 Encounter for palliative care: Secondary | ICD-10-CM | POA: Insufficient documentation

## 2016-11-22 LAB — URINE CULTURE

## 2016-11-22 LAB — CALCIUM, IONIZED: CALCIUM, IONIZED, SERUM: 5 mg/dL (ref 4.5–5.6)

## 2016-11-23 ENCOUNTER — Telehealth (HOSPITAL_BASED_OUTPATIENT_CLINIC_OR_DEPARTMENT_OTHER): Payer: Self-pay

## 2016-11-23 NOTE — Telephone Encounter (Signed)
Post ED Visit - Positive Culture Follow-up  Culture report reviewed by antimicrobial stewardship pharmacist:  []  Elenor Quinones, Pharm.D. []  Heide Guile, Pharm.D., BCPS AQ-ID []  Parks Neptune, Pharm.D., BCPS []  Alycia Rossetti, Pharm.D., BCPS []  [place], Pharm.D., BCPS, AAHIVP []  Legrand Como, Pharm.D., BCPS, AAHIVP []  Salome Arnt, PharmD, BCPS []  Dimitri Ped, PharmD, BCPS []  Vincenza Hews, PharmD, BCPS X  Charlene Brooke, RPh  Positive urine culture >/= 100,000 colonies proteus Mirabilis   No treatment given ECL/hospice with poor oral intake  Dortha Kern 11/23/2016, 3:21 PM

## 2016-12-09 DEATH — deceased

## 2018-08-12 IMAGING — CR DG CHEST 2V
1 series · 2 of 2 positions shown · non-contrast
Comparison: Chest x-rays dated 03/10/2016, 03/09/2016 and
03/07/2016 and chest CT dated 03/09/2016

CLINICAL DATA: Pneumonia. Cough. Chest pain and shortness of
breath.

EXAM:
CHEST  2 VIEW

[Series 1: dg chest 2 view · 0.14mm/px · 2 of 2 slices shown]
[im 1/2]
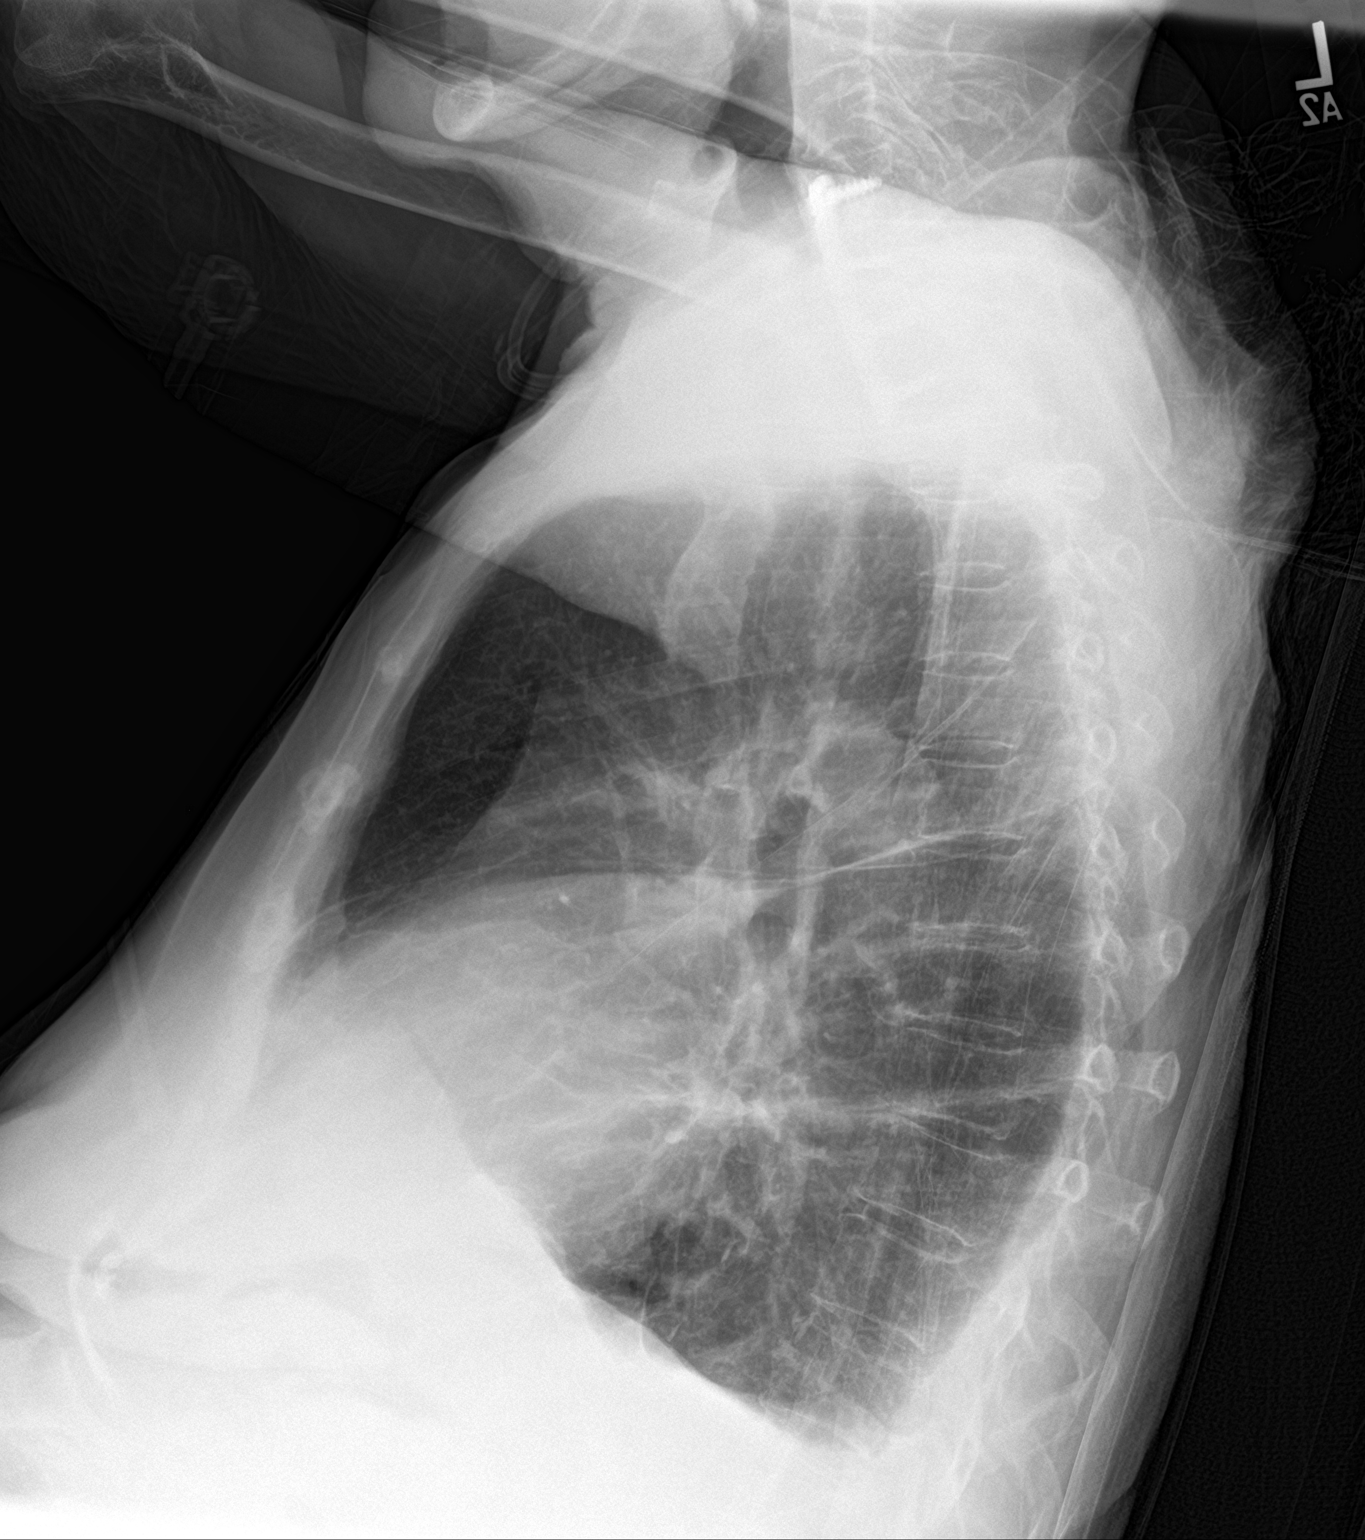
[im 2/2]
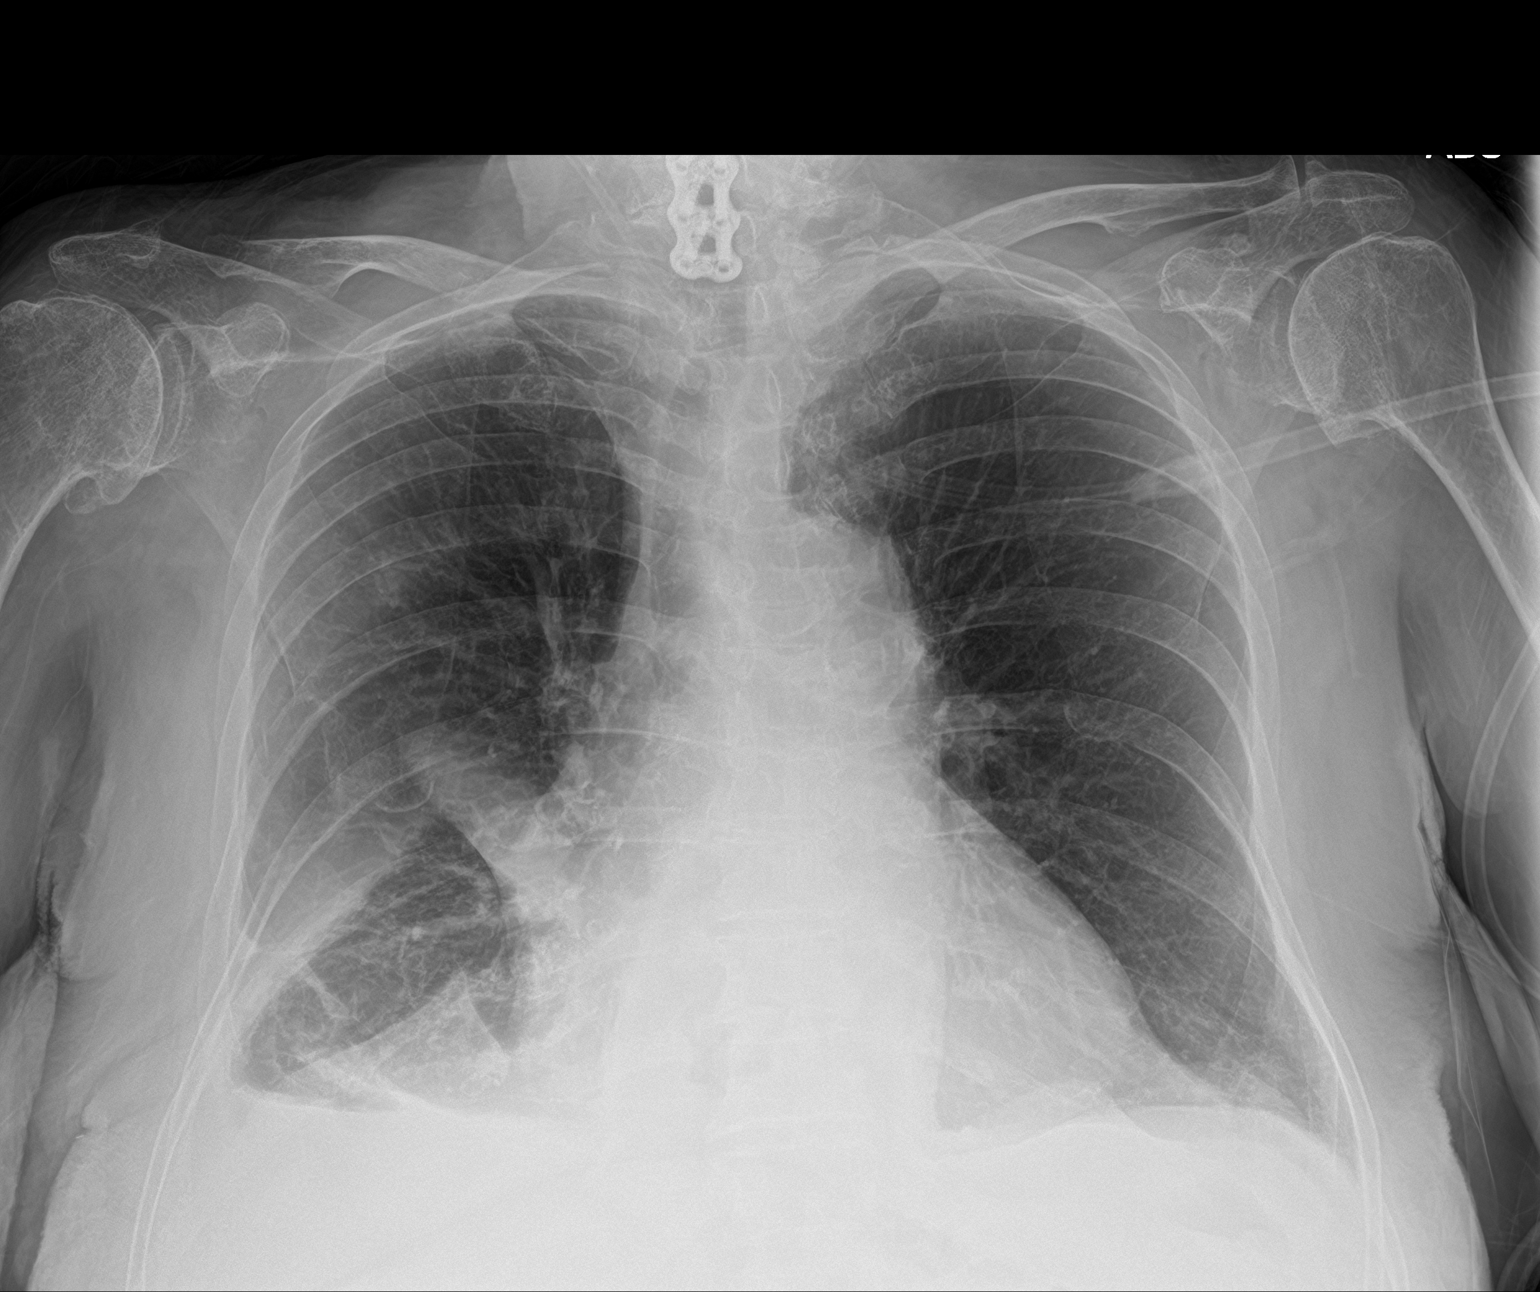

[2 of 2 positions shown; findings below may reference images not displayed]

FINDINGS: The heart is at the upper limits of normal in size. Pulmonary
vascularity is normal. There has been marked decrease in the right
pleural effusion with a small to moderate residual right effusion.
There is a tiny residual left effusion.

No consolidative infiltrates. No acute bone abnormality.
Calcification in the arch of the aorta.
IMPRESSION: Marked improvement in the right pleural effusion. Almost complete
resolution of left pleural effusion. No new abnormalities.

## 2018-08-21 IMAGING — US US ABDOMEN COMPLETE
1 series · 14 of 25 positions shown · non-contrast
Comparison: CT of the abdomen and pelvis 07/28/2016

CLINICAL DATA: Nausea for 4 days.

EXAM:
ABDOMEN ULTRASOUND COMPLETE

[Series 1: us abdomen complete · 0.19mm/px · 14 of 116 slices shown]
[im 1/116]
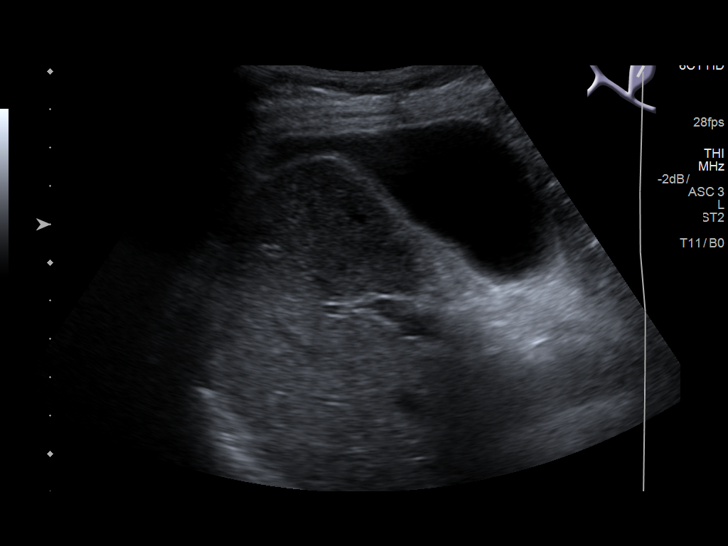
[im 10/116]
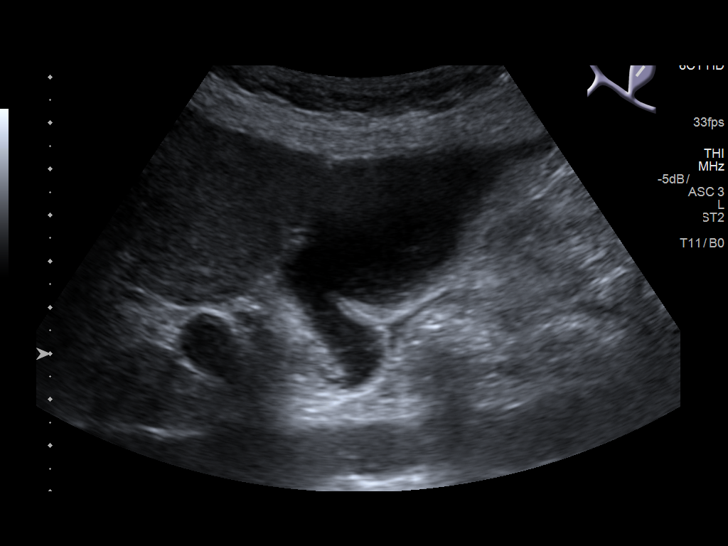
[im 20/116]
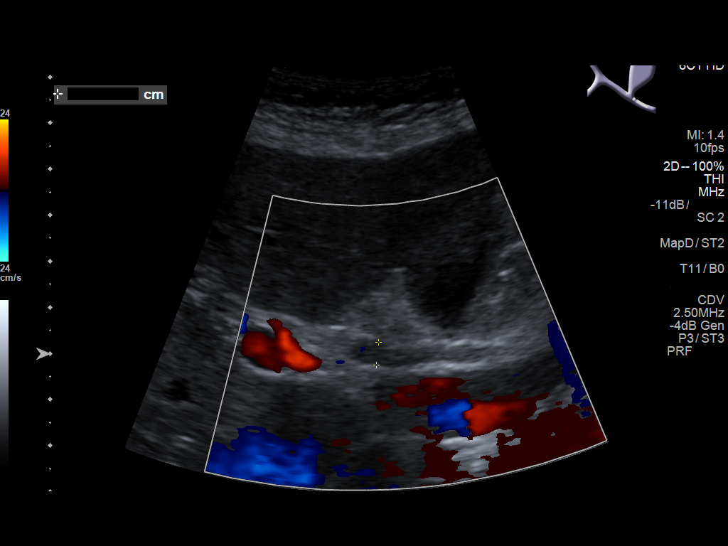
[im 29/116]
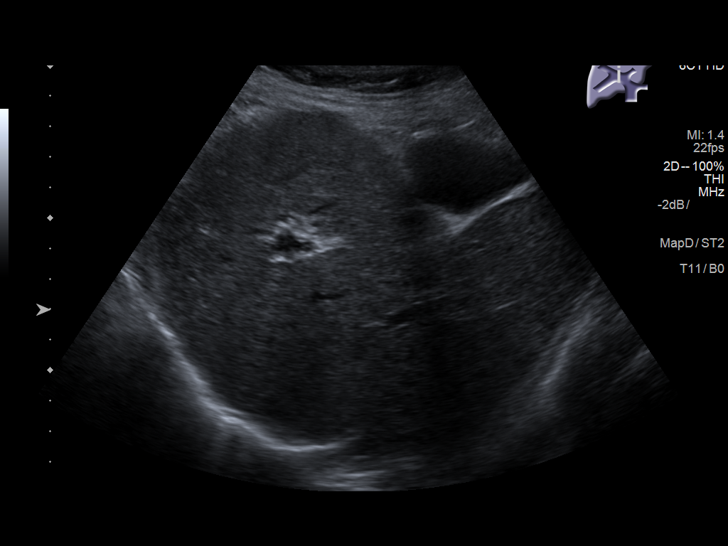
[im 39/116]
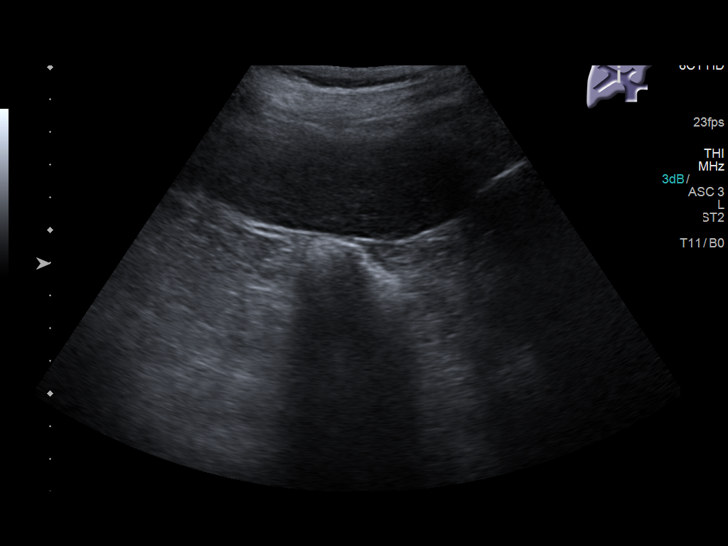
[im 44/116]
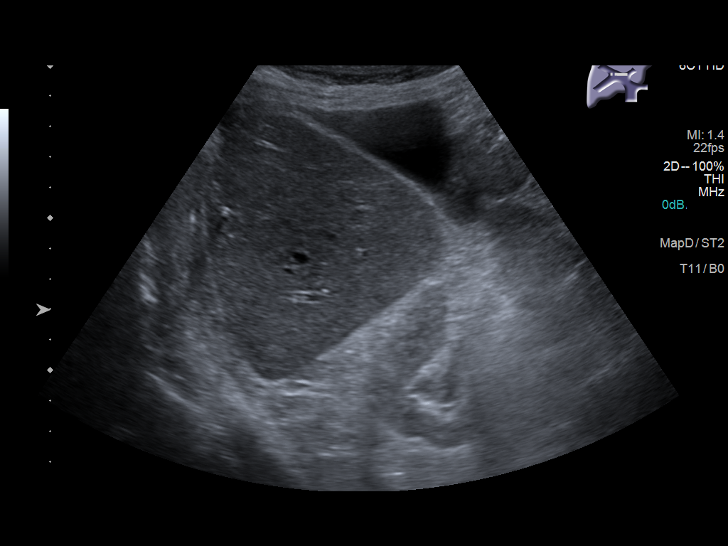
[im 53/116]
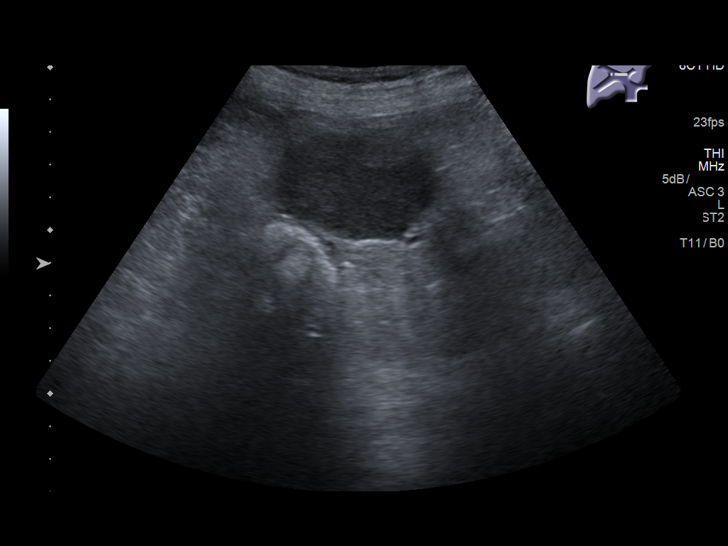
[im 63/116]
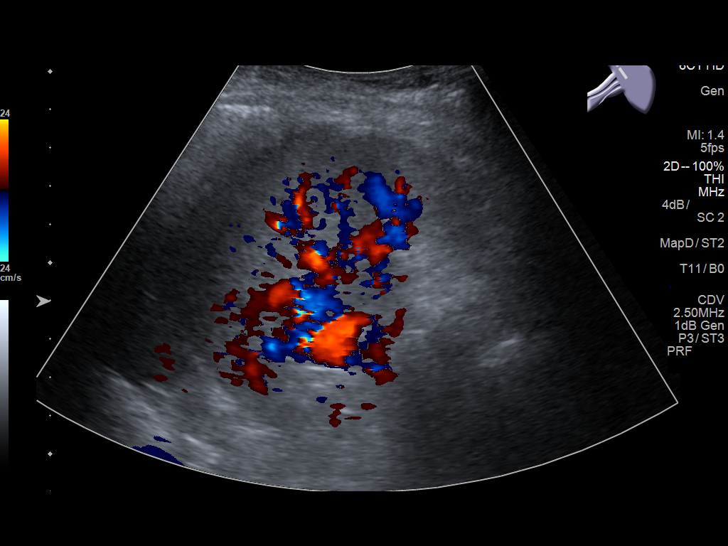
[im 72/116]
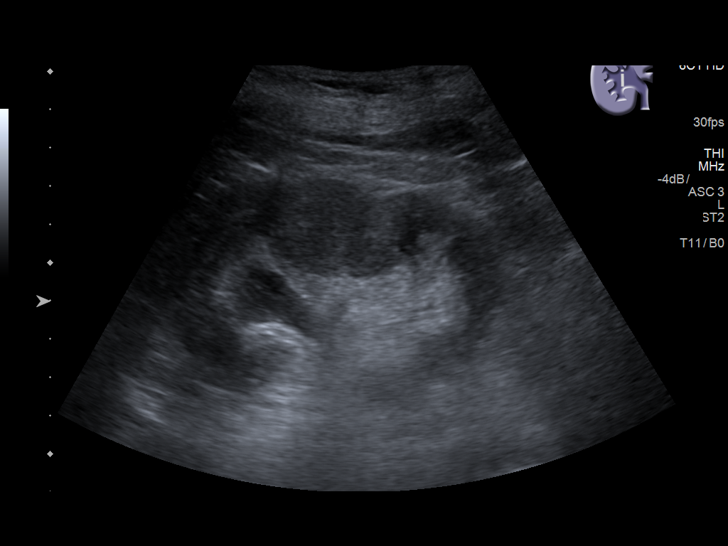
[im 77/116]
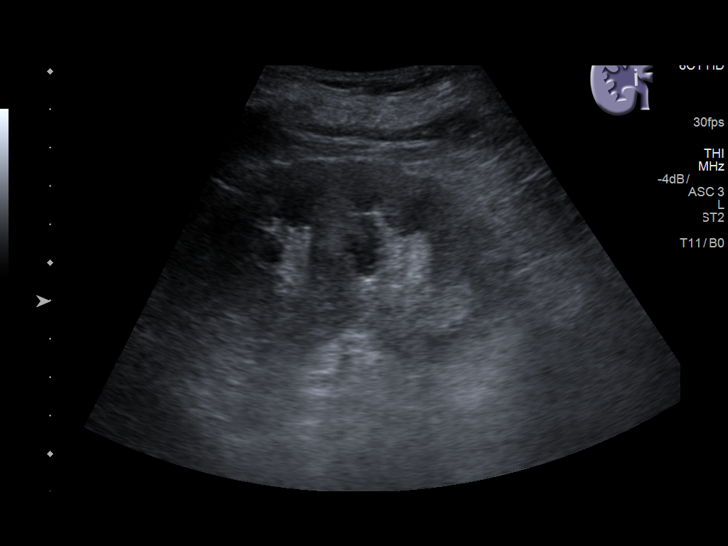
[im 87/116]
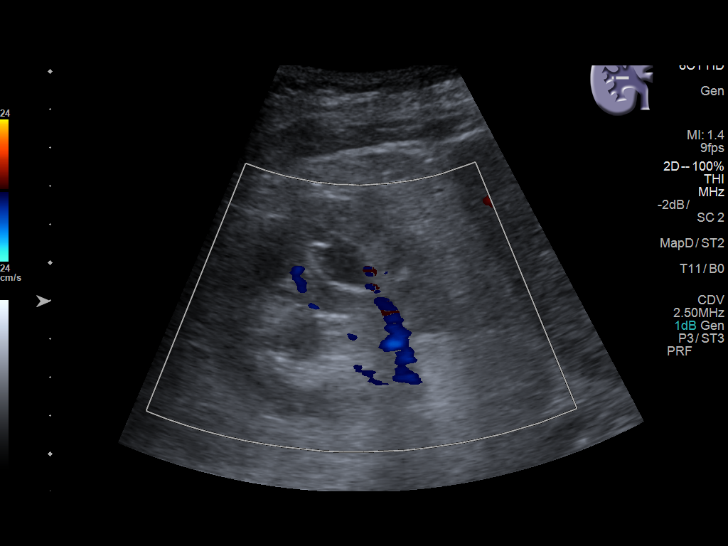
[im 96/116]
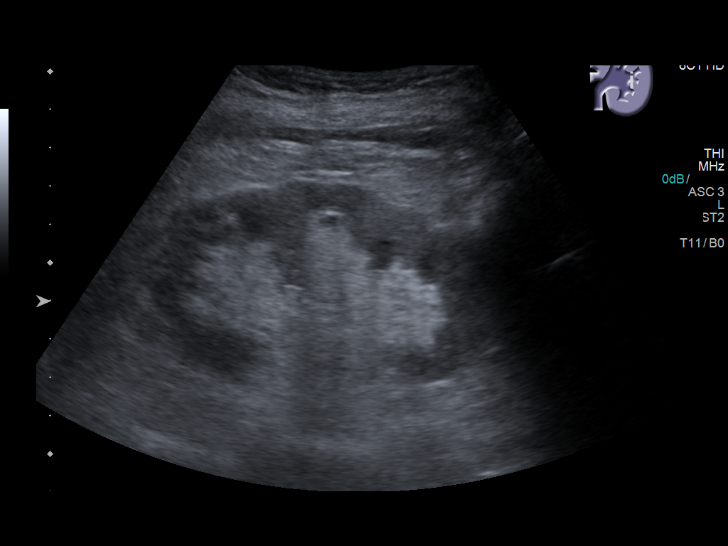
[im 106/116]
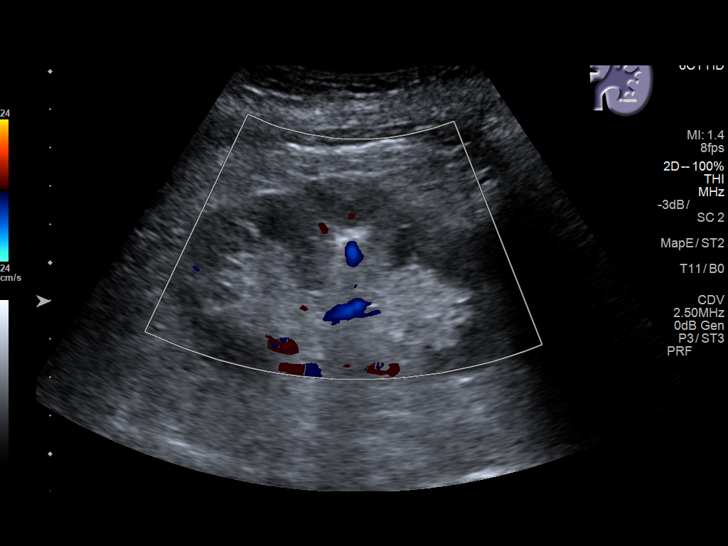
[im 116/116]
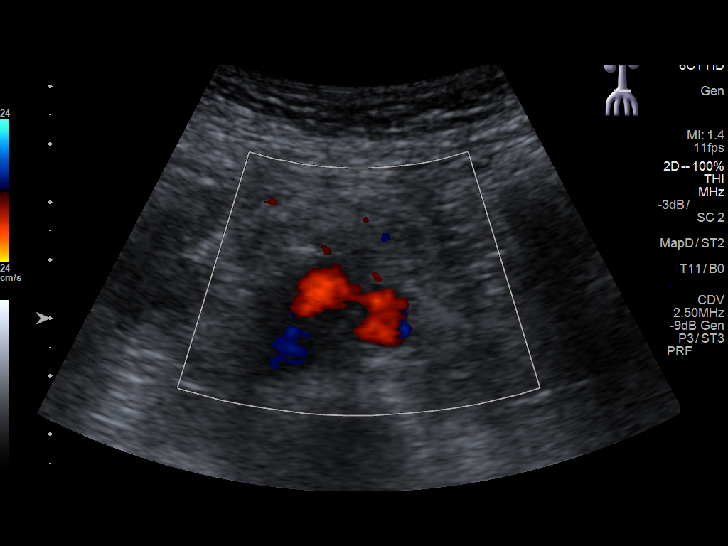

[14 of 25 positions shown; findings below may reference images not displayed]

FINDINGS: Gallbladder: Gallbladder wall is normal thickness, 2 mm. No
sonographic Murphy's sign. Gallbladder sludge is present. No stones
or pericholecystic fluid.

Common bile duct: Diameter: 5.0 mm

Liver: No focal lesion identified. Within normal limits in
parenchymal echogenicity.

IVC: No abnormality visualized.

Pancreas: Visualized portion unremarkable.

Spleen: Size and appearance within normal limits.

Right Kidney: Length: 8.3 cm. Normal echogenicity. No mass. Mild
dilatation of upper and midpole calices and right renal pelvis.

Left Kidney: Length: 9.3 cm. Echogenicity within normal limits. No
mass or hydronephrosis visualized.

Abdominal aorta: No aneurysm visualized.

Other findings: Trace amount of ascites in the left upper quadrant.
IMPRESSION: 1. No evidence for acute cholecystitis.
2. Stable mild dilatation of right renal calices and renal pelvis.
No renal lesions identified.
3. Trace amount of ascites in the left upper quadrant.

## 2018-09-03 IMAGING — CT CT HEAD CODE STROKE
3 series · 14 of 45 positions shown, 16 images · non-contrast
Comparison: None.

CLINICAL DATA: Code stroke. Aphasia, last seen normal at 2232
hours. History of hypertension, hyperlipidemia and diabetes.

EXAM:
CT HEAD WITHOUT CONTRAST
TECHNIQUE: Contiguous axial images were obtained from the base of the skull
through the vertex without intravenous contrast.

[Series 2: head wo · axial · 0.47mm/px · z∈[-120,-5]mm · 8 of 28 slices shown, 10 images]
[im 3/28  brain]
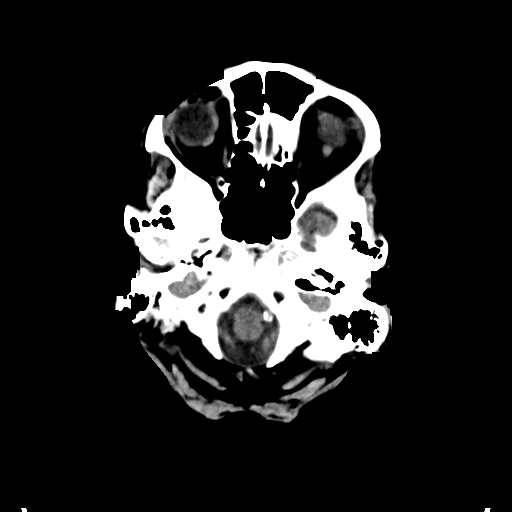
[im 3/28  bone]
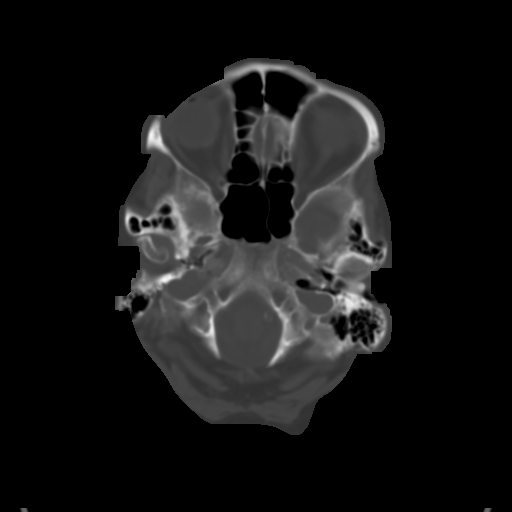
[im 6/28  brain]
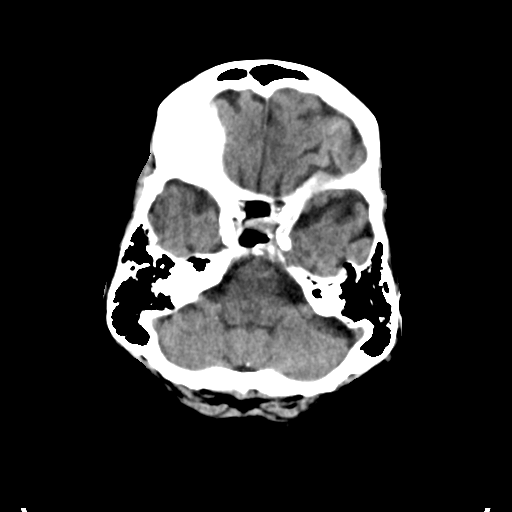
[im 10/28  brain]
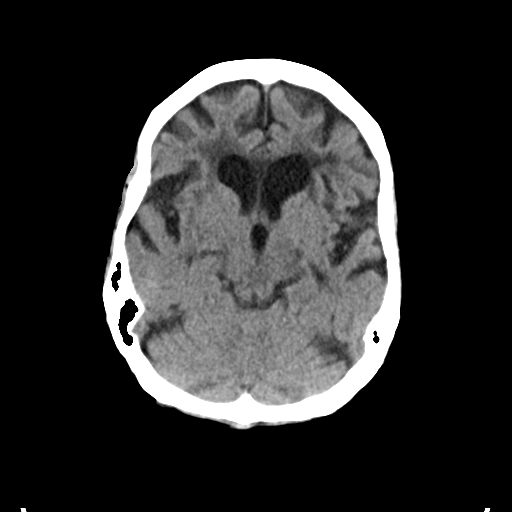
[im 13/28  brain]
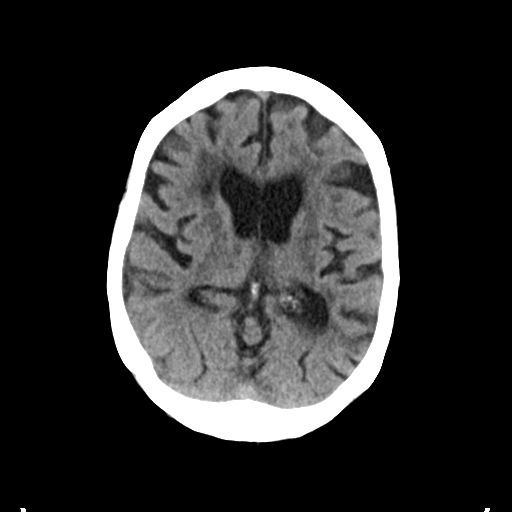
[im 16/28  brain]
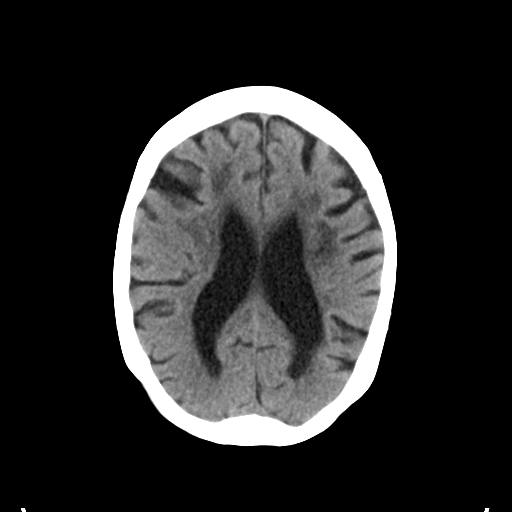
[im 16/28  bone]
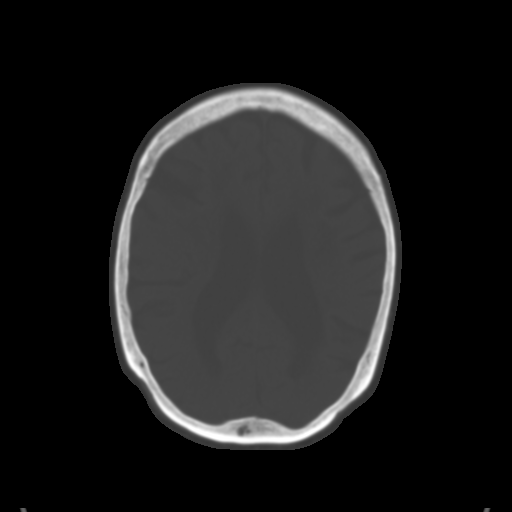
[im 19/28  brain]
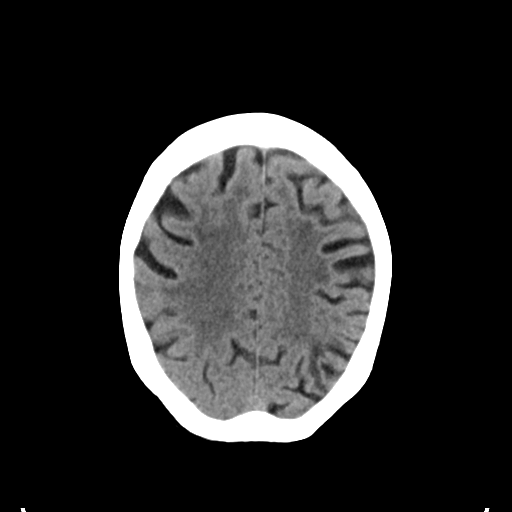
[im 23/28  brain]
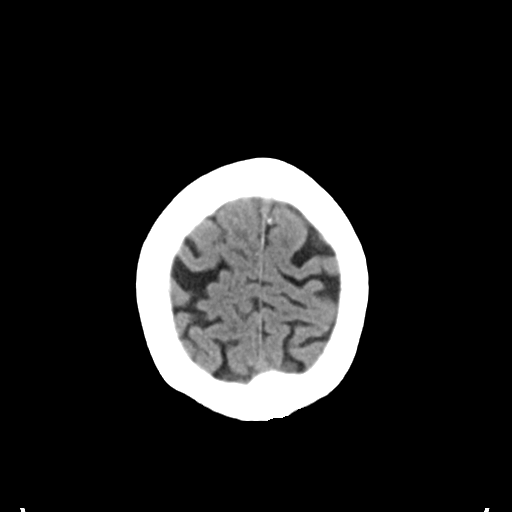
[im 26/28  brain]
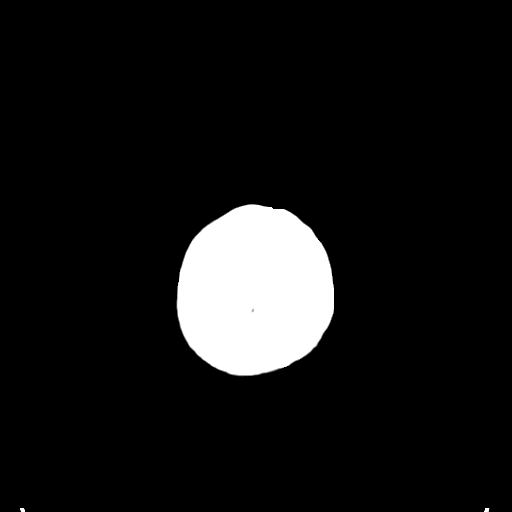

[Series 4: coronal soft tissue · coronal · 0.28mm/px · 3 of 58 slices shown]
[im 20/58  brain]
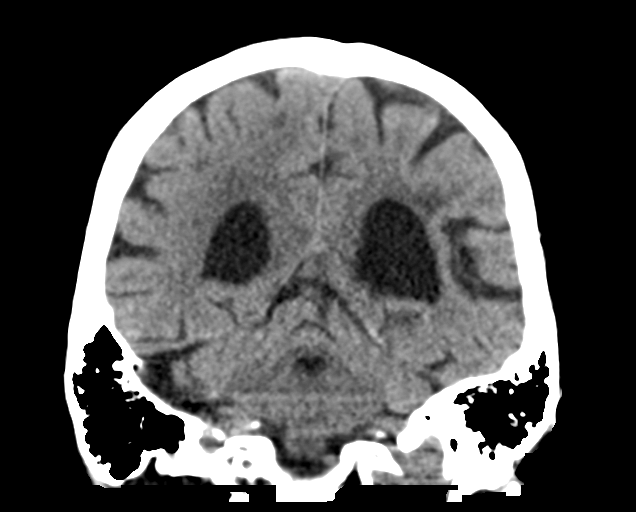
[im 26/58  brain]
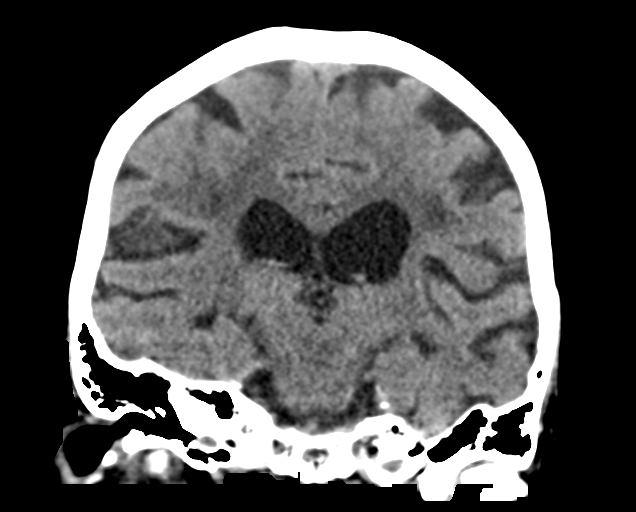
[im 32/58  brain]
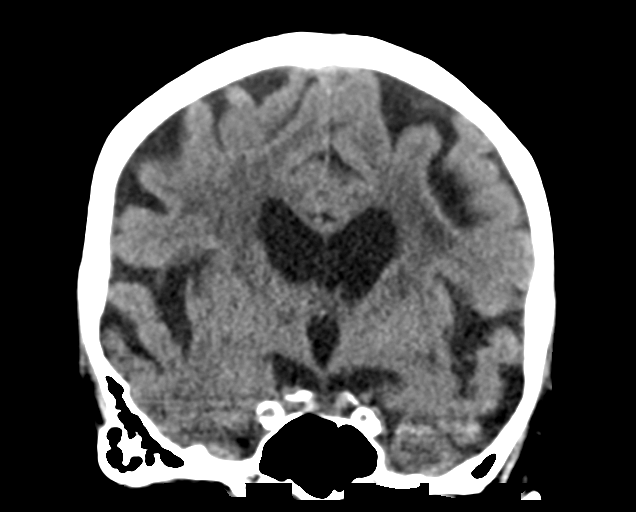

[Series 5: sagittal soft tissue · sagittal · 0.29mm/px · 3 of 47 slices shown]
[im 16/47  brain]
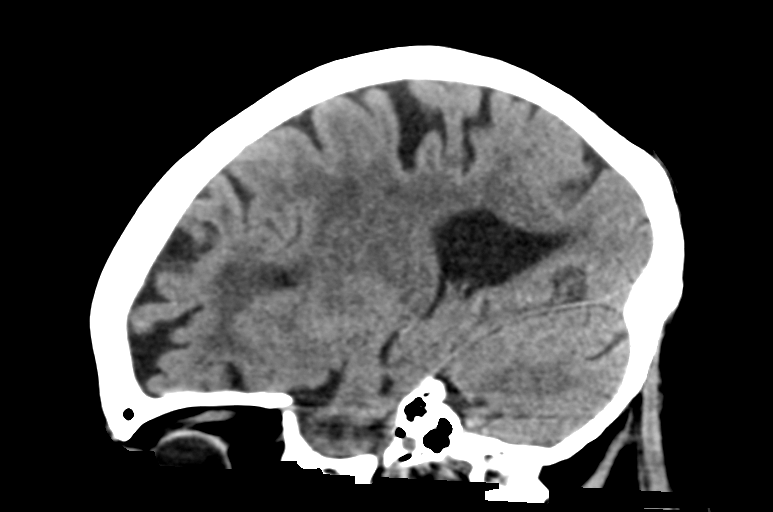
[im 24/47  brain]
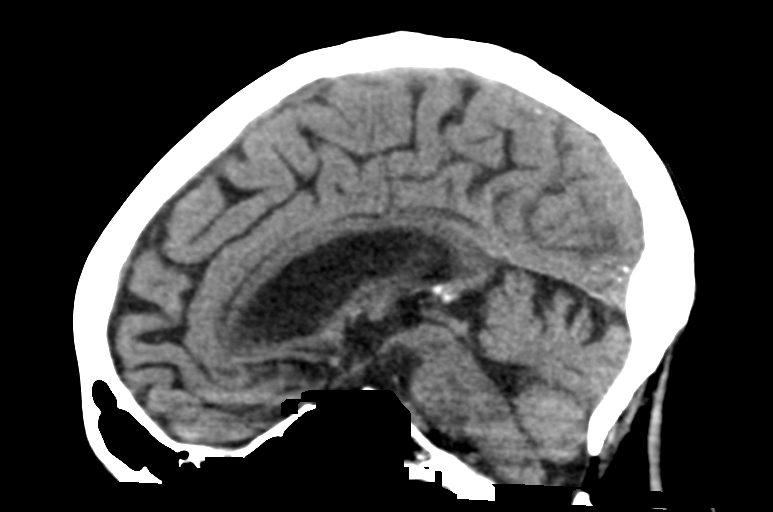
[im 31/47  brain]
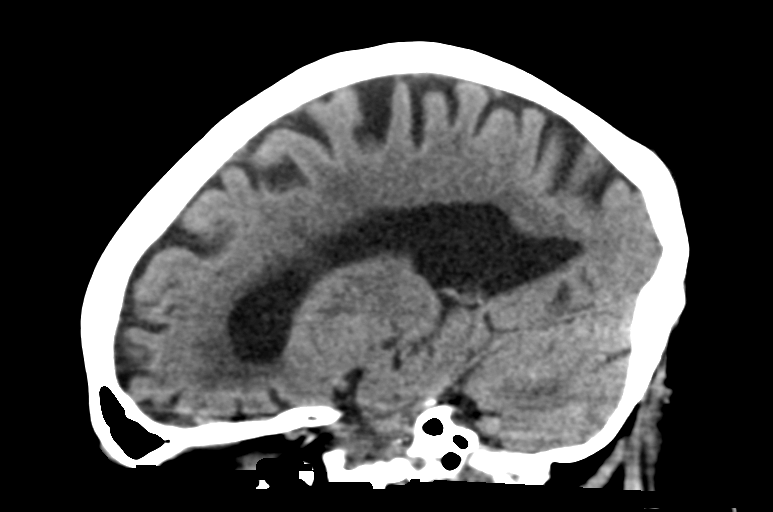

[14 of 45 positions shown; findings below may reference images not displayed]

FINDINGS: BRAIN: No intraparenchymal hemorrhage, mass effect nor midline
shift. The ventricles and sulci are normal for age. No acute large
vascular territory infarct. Old moderate LEFT parietal lobe infarct.
Patchy to confluent supratentorial white matter hypodensities. Old
basal ganglia lacunar infarcts. Age indeterminate thalamus lacunar
infarcts. Patchy supratentorial white matter hypodensities.

VASCULAR: Moderate calcific atherosclerosis of the carotid siphons.

SKULL: No skull fracture. Severe LEFT temporomandibular
osteoarthrosis. No significant scalp soft tissue swelling.

SINUSES/ORBITS: The mastoid air-cells and included paranasal sinuses
are well-aerated.The included ocular globes and orbital contents are
non-suspicious.

OTHER: Patient is edentulous.

ASPECTS (Alberta Stroke Program Early CT Score)

- Ganglionic level infarction (caudate, lentiform nuclei, internal
capsule, insula, M1-M3 cortex): 7

- Supraganglionic infarction (M4-M6 cortex): 3

Total score (0-10 with 10 being normal): 10
IMPRESSION: 1. No acute intracranial process.

Old LEFT parietal/MCA territory infarct. Old basal ganglia and
likely chronic bilateral thalamus infarcts. Moderate to severe
chronic small vessel ischemic disease.

2. ASPECTS is 10.

Critical Value/emergent results were called by telephone at the time
of interpretation on 04/18/2016 at [DATE] to Dr. J CRUZ DOCE ,
who verbally acknowledged these results.

## 2018-11-27 IMAGING — CR DG CHEST 2V
1 series · 2 of 2 positions shown · non-contrast
Comparison: Most recent comparison radiograph 06/30/2016. Chest CT
03/09/2016

CLINICAL DATA: Chest pain. Weakness, nausea and vomiting. No p.o.
intake for 3 days.

EXAM:
CHEST  2 VIEW

[Series 1: dg chest 2 view · 0.14mm/px · 2 of 2 slices shown]
[im 1/2]
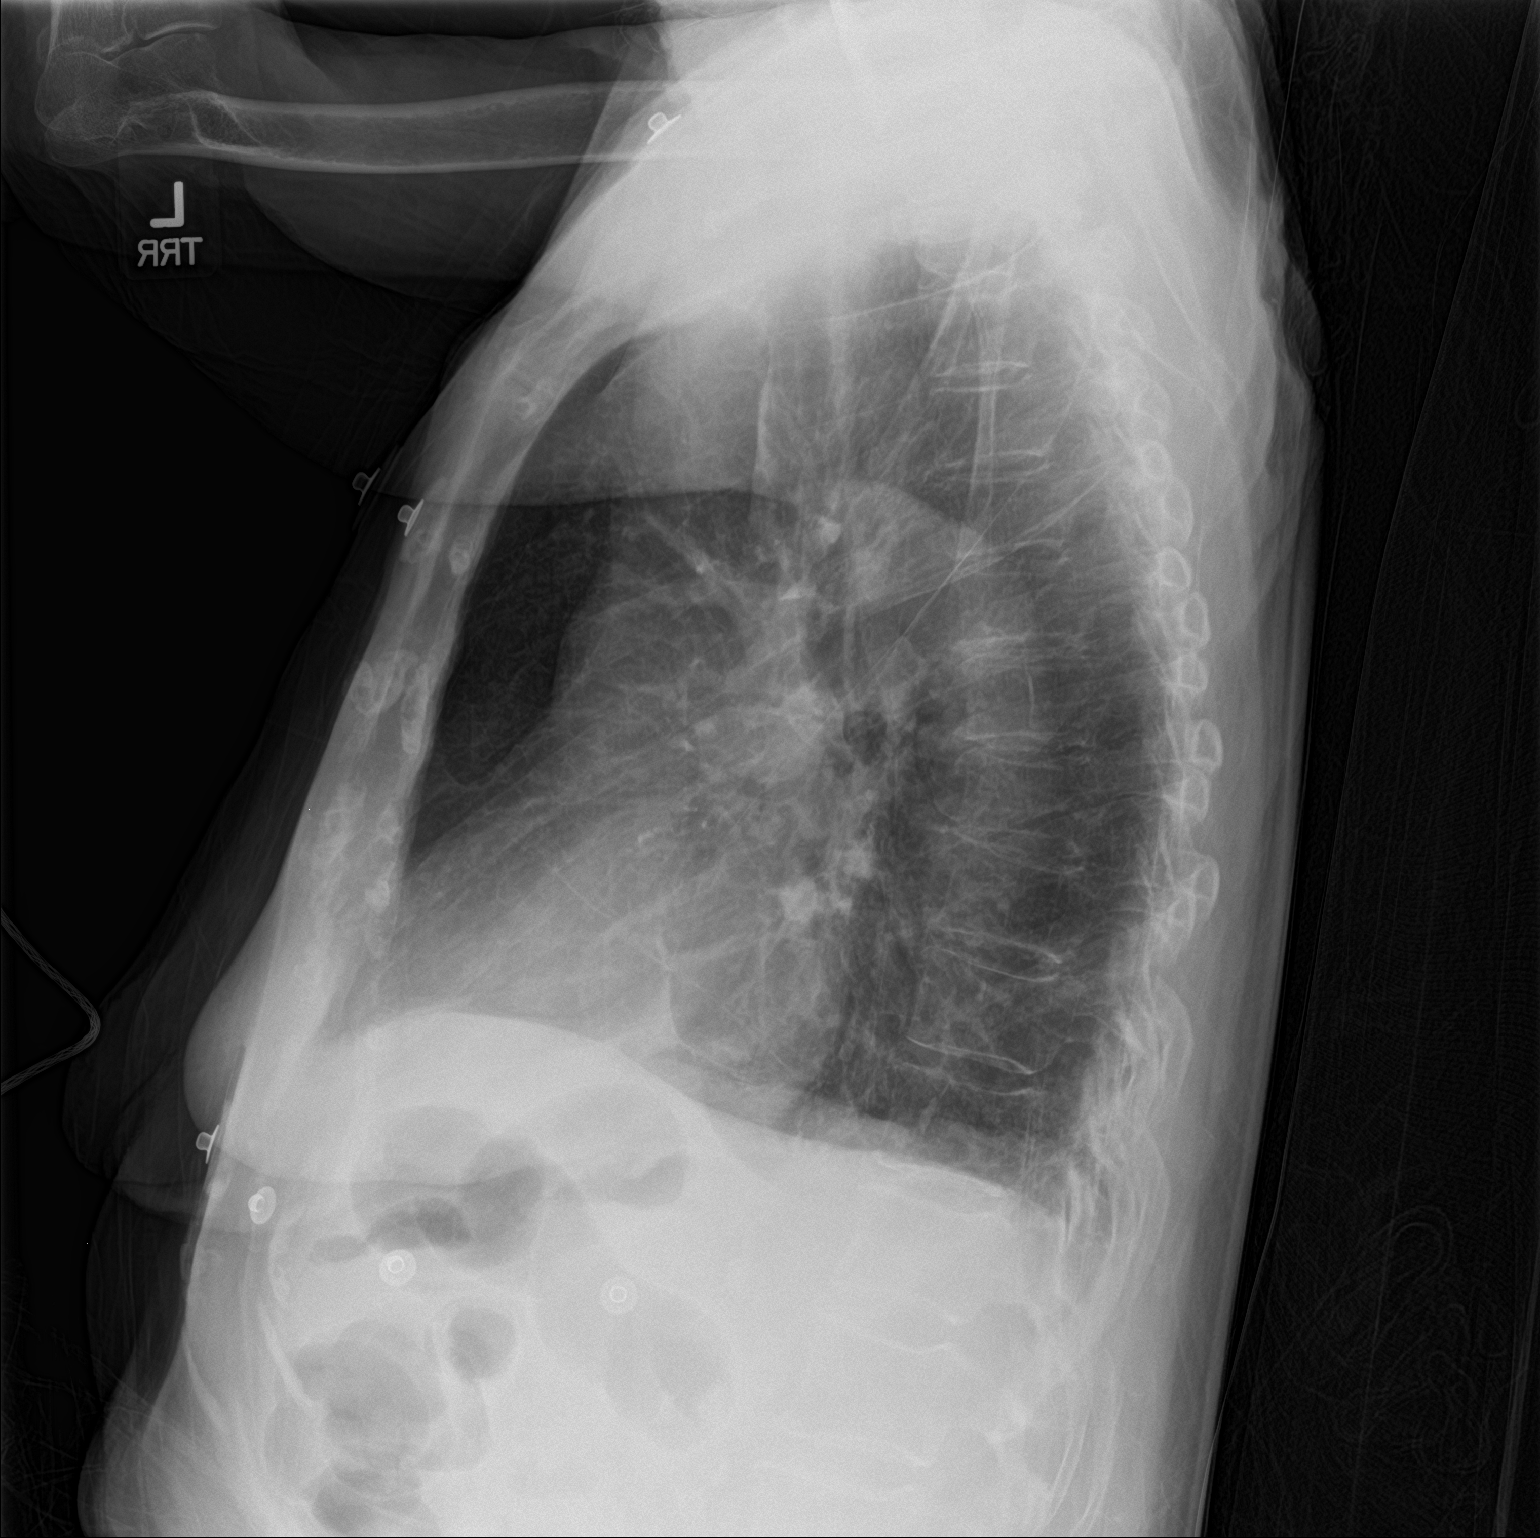
[im 2/2]
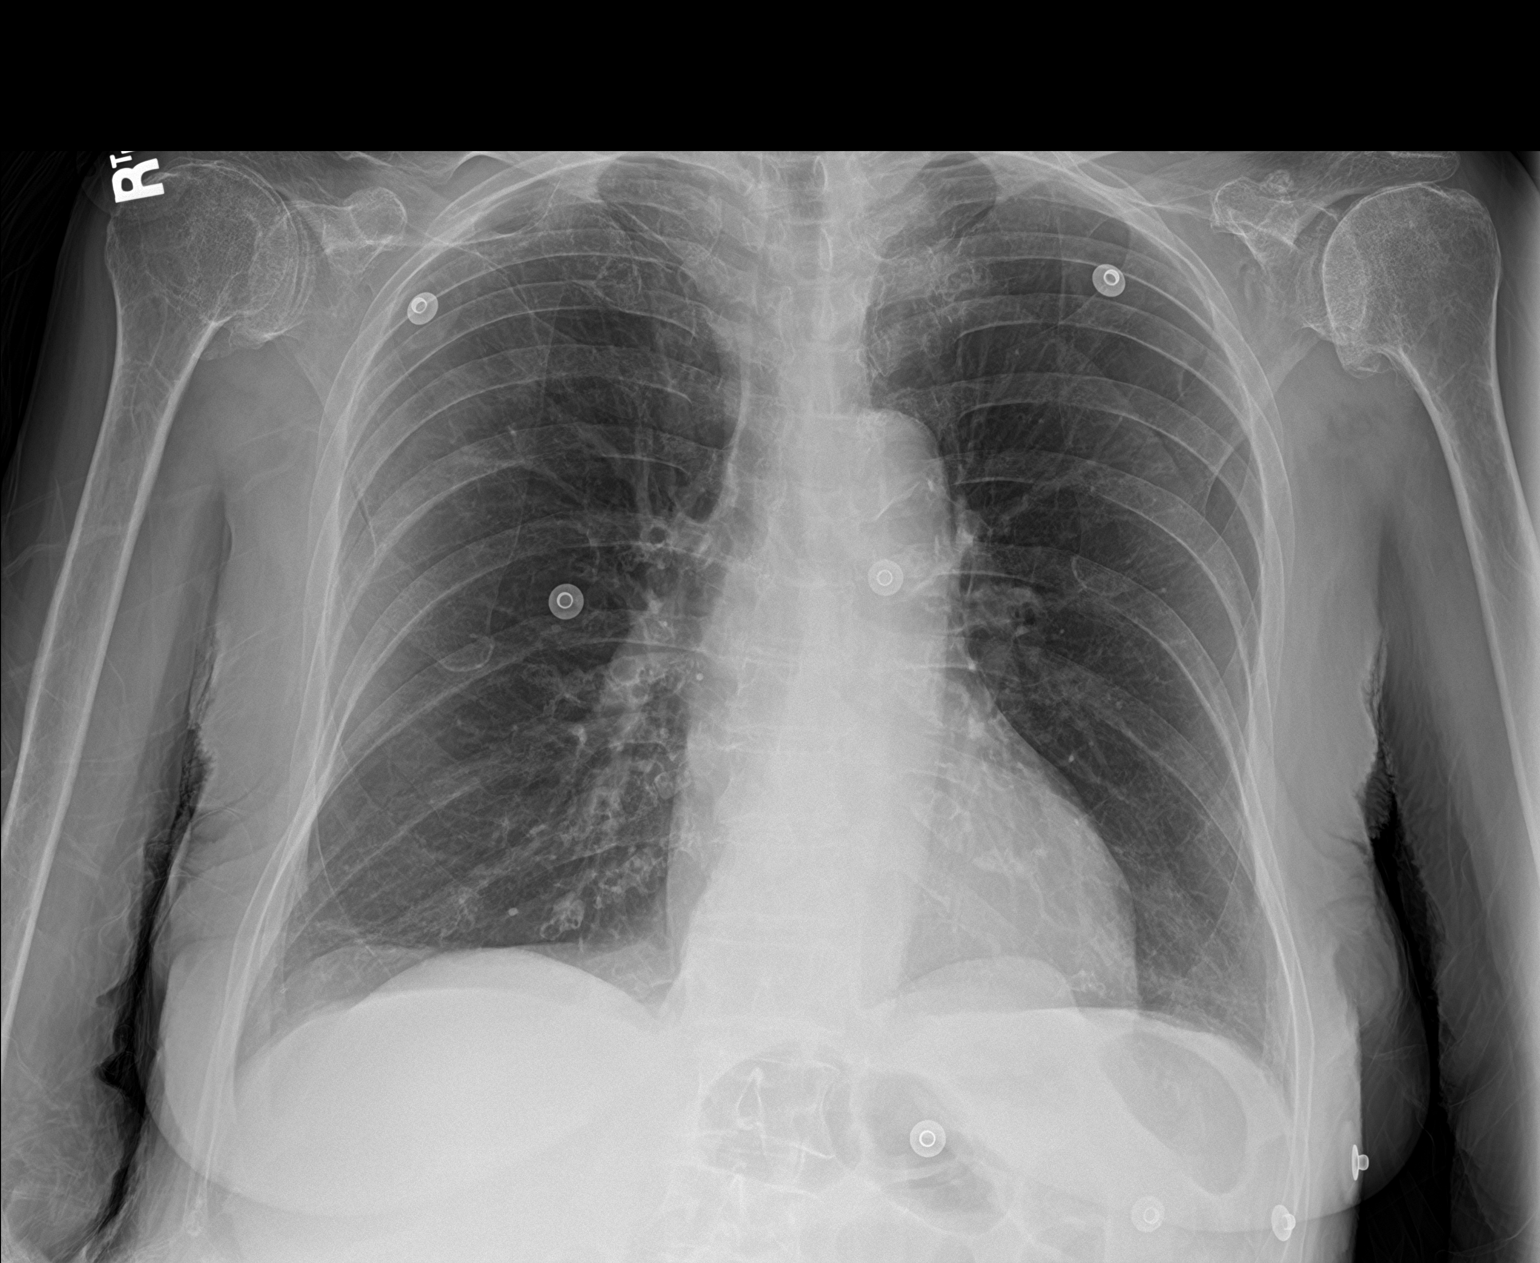

[2 of 2 positions shown; findings below may reference images not displayed]

FINDINGS: The cardiomediastinal contours are normal. Again seen
atherosclerosis of thoracic aorta. Hiatal hernia. Minimal basilar
atelectasis. Pulmonary vasculature is normal. No consolidation,
pleural effusion, or pneumothorax. No acute osseous abnormalities
are seen. Chronic widening of right acromioclavicular joint. Bones
are under mineralized.
IMPRESSION: No acute abnormality.
